# Patient Record
Sex: Male | Born: 1975
Health system: Southern US, Community
[De-identification: ages and names within clinical notes are randomized; demographics above are authoritative.]

## PROBLEM LIST (undated history)

## (undated) DIAGNOSIS — T148XXD Other injury of unspecified body region, subsequent encounter: Secondary | ICD-10-CM

## (undated) DIAGNOSIS — I10 Essential (primary) hypertension: Secondary | ICD-10-CM

## (undated) DIAGNOSIS — F1990 Other psychoactive substance use, unspecified, uncomplicated: Secondary | ICD-10-CM

## (undated) DIAGNOSIS — E119 Type 2 diabetes mellitus without complications: Secondary | ICD-10-CM

## (undated) HISTORY — DX: Essential (primary) hypertension: I10

## (undated) HISTORY — DX: Other psychoactive substance use, unspecified, uncomplicated: F19.90

## (undated) HISTORY — DX: Other injury of unspecified body region, subsequent encounter: T14.8XXD

---

## 2006-06-27 ENCOUNTER — Emergency Department: Payer: Self-pay | Admitting: Emergency Medicine

## 2008-10-25 ENCOUNTER — Emergency Department (HOSPITAL_COMMUNITY): Admission: EM | Admit: 2008-10-25 | Discharge: 2008-10-25 | Payer: Self-pay | Admitting: Emergency Medicine

## 2009-02-09 ENCOUNTER — Emergency Department: Payer: Self-pay | Admitting: Emergency Medicine

## 2010-08-22 ENCOUNTER — Emergency Department: Payer: Self-pay | Admitting: Emergency Medicine

## 2010-10-09 LAB — BASIC METABOLIC PANEL
BUN: 10 mg/dL (ref 6–23)
Chloride: 103 mEq/L (ref 96–112)
Creatinine, Ser: 1.03 mg/dL (ref 0.4–1.5)
GFR calc non Af Amer: >60 mL/min (ref >60)
Glucose, Bld: 149 mg/dL — ABNORMAL HIGH (ref 70–99)

## 2010-10-09 LAB — RAPID URINE DRUG SCREEN, HOSP PERFORMED
Amphetamines: NOT DETECTED
Benzodiazepines: NOT DETECTED
Cocaine: POSITIVE — AB

## 2010-10-09 LAB — DIFFERENTIAL
Basophils Absolute: 0 10*3/uL (ref 0.0–0.1)
Eosinophils Absolute: 0.1 10*3/uL (ref 0.0–0.7)
Eosinophils Relative: 1 % (ref 0–5)
Lymphs Abs: 1.6 10*3/uL (ref 0.7–4.0)
Neutrophils Relative %: 73 % (ref 43–77)

## 2010-10-09 LAB — CBC
MCV: 92.8 fL (ref 78.0–100.0)
Platelets: 171 10*3/uL (ref 150–400)
RDW: 14.1 % (ref 11.5–15.5)
WBC: 9.6 10*3/uL (ref 4.0–10.5)

## 2010-10-09 LAB — TRICYCLICS SCREEN, URINE: TCA Scrn: NOT DETECTED

## 2010-10-09 LAB — ETHANOL: Alcohol, Ethyl (B): <5 mg/dL (ref 0–10)

## 2016-02-19 ENCOUNTER — Ambulatory Visit (HOSPITAL_COMMUNITY)
Admission: EM | Admit: 2016-02-19 | Discharge: 2016-02-19 | Disposition: A | Discharge status: Other Acute Inpatient Hospital | Payer: Self-pay

## 2016-02-19 ENCOUNTER — Encounter (HOSPITAL_COMMUNITY): Payer: Self-pay | Admitting: *Deleted

## 2016-02-19 ENCOUNTER — Encounter (HOSPITAL_COMMUNITY): Payer: Self-pay | Admitting: Emergency Medicine

## 2016-02-19 ENCOUNTER — Emergency Department (HOSPITAL_COMMUNITY)
Admission: EM | Admit: 2016-02-19 | Discharge: 2016-02-19 | Disposition: A | Discharge status: Home / Self Care | Payer: Self-pay | Attending: Emergency Medicine | Admitting: Emergency Medicine

## 2016-02-19 DIAGNOSIS — R739 Hyperglycemia, unspecified: Secondary | ICD-10-CM

## 2016-02-19 DIAGNOSIS — L0291 Cutaneous abscess, unspecified: Secondary | ICD-10-CM

## 2016-02-19 DIAGNOSIS — L02413 Cutaneous abscess of right upper limb: Secondary | ICD-10-CM | POA: Insufficient documentation

## 2016-02-19 DIAGNOSIS — R634 Abnormal weight loss: Secondary | ICD-10-CM

## 2016-02-19 DIAGNOSIS — F17200 Nicotine dependence, unspecified, uncomplicated: Secondary | ICD-10-CM | POA: Insufficient documentation

## 2016-02-19 DIAGNOSIS — E1065 Type 1 diabetes mellitus with hyperglycemia: Secondary | ICD-10-CM | POA: Insufficient documentation

## 2016-02-19 HISTORY — DX: Type 2 diabetes mellitus without complications: E11.9

## 2016-02-19 LAB — I-STAT CHEM 8, ED
BUN: 11 mg/dL (ref 6–20)
CALCIUM ION: 1.16 mmol/L (ref 1.13–1.30)
CREATININE: 0.3 mg/dL — AB (ref 0.61–1.24)
Chloride: 93 mmol/L — ABNORMAL LOW (ref 101–111)
GLUCOSE: 239 mg/dL — AB (ref 65–99)
HCT: 44 % (ref 39.0–52.0)
Hemoglobin: 15 g/dL (ref 13.0–17.0)
Potassium: 3.9 mmol/L (ref 3.5–5.1)
Sodium: 135 mmol/L (ref 135–145)
TCO2: 27 mmol/L (ref 0–100)

## 2016-02-19 LAB — BASIC METABOLIC PANEL
ANION GAP: 16 — AB (ref 5–15)
BUN: 11 mg/dL (ref 6–20)
CALCIUM: 9.5 mg/dL (ref 8.9–10.3)
CHLORIDE: 83 mmol/L — AB (ref 101–111)
CO2: 25 mmol/L (ref 22–32)
Creatinine, Ser: 0.99 mg/dL (ref 0.61–1.24)
GFR calc non Af Amer: >60 mL/min (ref >60)
GLUCOSE: 607 mg/dL — AB (ref 65–99)
POTASSIUM: 4.3 mmol/L (ref 3.5–5.1)
Sodium: 124 mmol/L — ABNORMAL LOW (ref 135–145)

## 2016-02-19 LAB — I-STAT VENOUS BLOOD GAS, ED
ACID-BASE EXCESS: 6 mmol/L — AB (ref 0.0–2.0)
BICARBONATE: 28.5 meq/L — AB (ref 20.0–24.0)
O2 SAT: 100 %
PO2 VEN: 162 mmHg — AB (ref 31.0–45.0)
TCO2: 30 mmol/L (ref 0–100)
pCO2, Ven: 35.5 mmHg — ABNORMAL LOW (ref 45.0–50.0)
pH, Ven: 7.513 — ABNORMAL HIGH (ref 7.250–7.300)

## 2016-02-19 LAB — URINE MICROSCOPIC-ADD ON
RBC / HPF: NONE SEEN RBC/hpf (ref 0–5)
WBC, UA: NONE SEEN WBC/hpf (ref 0–5)

## 2016-02-19 LAB — CBG MONITORING, ED

## 2016-02-19 LAB — CBC
HEMATOCRIT: 44.9 % (ref 39.0–52.0)
HEMOGLOBIN: 15.3 g/dL (ref 13.0–17.0)
MCH: 32.3 pg (ref 26.0–34.0)
MCHC: 34.1 g/dL (ref 30.0–36.0)
MCV: 94.9 fL (ref 78.0–100.0)
Platelets: 174 10*3/uL (ref 150–400)
RBC: 4.73 MIL/uL (ref 4.22–5.81)
RDW: 13.4 % (ref 11.5–15.5)
WBC: 9.2 10*3/uL (ref 4.0–10.5)

## 2016-02-19 LAB — URINALYSIS, ROUTINE W REFLEX MICROSCOPIC
Bilirubin Urine: NEGATIVE
Glucose, UA: >1000 mg/dL — AB
HGB URINE DIPSTICK: NEGATIVE
Ketones, ur: >80 mg/dL — AB
Leukocytes, UA: NEGATIVE
NITRITE: NEGATIVE
PH: 5.5 (ref 5.0–8.0)
Protein, ur: NEGATIVE mg/dL
SPECIFIC GRAVITY, URINE: 1.04 — AB (ref 1.005–1.030)

## 2016-02-19 LAB — I-STAT CG4 LACTIC ACID, ED
LACTIC ACID, VENOUS: 1.83 mmol/L (ref 0.5–1.9)
LACTIC ACID, VENOUS: 1.2 mmol/L (ref 0.5–1.9)

## 2016-02-19 MED ORDER — "SYRINGE 25G X 1-1/2"" 3 ML MISC": 1.0000 | Freq: Two times a day (BID) | 0 refills | Status: DC

## 2016-02-19 MED ORDER — LIDOCAINE HCL (PF) 1 % IJ SOLN
30.0000 mL | Freq: Once | INTRAMUSCULAR | Status: AC
  Administered 2016-02-19: 30 mL
  Filled 2016-02-19: qty 30

## 2016-02-19 MED ORDER — INSULIN ASPART 100 UNIT/ML ~~LOC~~ SOLN
10.0000 [IU] | Freq: Once | SUBCUTANEOUS | Status: AC
  Administered 2016-02-19: 10 [IU] via SUBCUTANEOUS
  Filled 2016-02-19: qty 1

## 2016-02-19 MED ORDER — INSULIN ASPART PROT & ASPART (70-30 MIX) 100 UNIT/ML ~~LOC~~ SUSP: 20.0000 [IU] | Freq: Two times a day (BID) | SUBCUTANEOUS | 1 refills | Status: DC

## 2016-02-19 MED ORDER — SODIUM CHLORIDE 0.9 % IV BOLUS (SEPSIS)
2000.0000 mL | Freq: Once | INTRAVENOUS | Status: AC
Start: 2016-02-19 — End: 2016-02-19
  Administered 2016-02-19: 2000 mL via INTRAVENOUS

## 2016-02-19 MED ORDER — SULFAMETHOXAZOLE-TRIMETHOPRIM 800-160 MG PO TABS
1.0000 | ORAL_TABLET | Freq: Once | ORAL | Status: AC
  Administered 2016-02-19: 1 via ORAL
  Filled 2016-02-19: qty 1

## 2016-02-19 MED ORDER — SULFAMETHOXAZOLE-TRIMETHOPRIM 800-160 MG PO TABS: 1.0000 | ORAL_TABLET | Freq: Two times a day (BID) | ORAL | 0 refills | Status: AC

## 2016-02-19 NOTE — ED Notes (Signed)
CBG: >600 ; HI 

## 2016-02-19 NOTE — ED Provider Notes (Addendum)
[facility]    CSN: [id number] Arrival date & time: 02/19/16  1031  First Provider Contact:  First MD Initiated Contact with Patient 02/19/16 1129        History   Chief Complaint Chief Complaint  Patient presents with  . Blood Sugar Problem    HPI Tony Long is a 40 y.o. male.    Arm Injury  Location:  Elbow Elbow location:  R elbow Injury: no (pt out of incarceration, reports self injecting and recent swelling and pain at antecubital site, on right.)   Pain details:    Severity:  Moderate   Onset quality:  Sudden   Duration:  1 week   Progression:  Worsening Dislocation: no   Foreign body present:  No foreign bodies Tetanus status:  Up to date Prior injury to area:  No Relieved by:  None tried Worsened by:  Nothing Ineffective treatments:  None tried Associated symptoms: fatigue, fever and swelling   Associated symptoms comment:  Reports being diabetic with wt loss and no insulin for 1 month.   No past medical history on file.  There are no active problems to display for this patient.   No past surgical history on file.     Home Medications    Prior to Admission medications   Not on File    Family History No family history on file.  Social History Social History  Substance Use Topics  . Smoking status: Not on file  . Smokeless tobacco: Not on file  . Alcohol use Not on file     Allergies   Review of patient's allergies indicates not on file.   Review of Systems Review of Systems  Constitutional: Positive for fatigue, fever and unexpected weight change.  Neurological: Positive for weakness.     Physical Exam Triage Vital Signs ED Triage Vitals [02/19/16 1121]  Enc Vitals Group     BP 136/93     Pulse Rate 92     Resp 16     Temp 98.4 F (36.9 C)     Temp Source Oral     SpO2 99 %     Weight 150 lb (68 kg)     Height 5\' 11"  (1.803 m)     Head Circumference      Peak Flow      Pain Score      Pain Loc     Pain Edu?      Excl. in GC?    No data found.   Updated Vital Signs BP 136/93 (BP Location: Left Arm)   Pulse 92   Temp 98.4 F (36.9 C) (Oral)   Resp 16   Ht 5\' 11"  (1.803 m)   Wt 150 lb (68 kg)   SpO2 99%   BMI 20.92 kg/m   Visual Acuity Right Eye Distance:   Left Eye Distance:   Bilateral Distance:    Right Eye Near:   Left Eye Near:    Bilateral Near:     Physical Exam  Constitutional: He appears well-developed and well-nourished.  Neurological: He is alert.  Skin: Skin is warm and dry. There is erythema.     Nursing note and vitals reviewed.    UC Treatments / Results  Labs (all labs ordered are listed, but only abnormal results are displayed) Labs Reviewed - No data to display  EKG  EKG Interpretation None       Radiology No results found.  Procedures Procedures (including critical care time)  Medications Ordered in UC Medications - No data to display   Initial Impression / Assessment and Plan / UC Course  I have reviewed the triage vital signs and the nursing notes.  Pertinent labs & imaging results that were available during my care of the patient were reviewed by me and considered in my medical decision making (see chart for details).  Clinical Course  sent for mngment of apparent diabetes and antecubital space infection from self injection.    Final Clinical Impressions(s) / UC Diagnoses   Final diagnoses:  None    New Prescriptions New Prescriptions   No medications on file     Linna HoffJames D Terrian Sentell, MD 02/19/16 1146    Linna HoffJames D Natlie Asfour, MD 02/19/16 1147

## 2016-02-19 NOTE — ED Triage Notes (Signed)
Pt sts hyperglycemia and abscess to right AC area x 1 week; pt sts has not had any insulin x 2 months since getting out of prison; pt with redness to right AC and swelling

## 2016-02-19 NOTE — ED Notes (Signed)
Report  phoned  To nurse  First  Shanda BumpsJessica

## 2016-02-19 NOTE — ED Provider Notes (Signed)
[facility]   CSN: [id number] Arrival date & time: 02/19/16  1210     History   Chief Complaint Chief Complaint  Patient presents with  . Hyperglycemia  . Abscess    HPI Tony Long is a 40 y.o. male.  The history is provided by the patient and medical records. No language interpreter was used.  Hyperglycemia  Blood sugar level PTA:  600 Severity:  Moderate Onset quality:  Gradual Duration: unk, last time patient had insulin was about a month ago. Timing:  Constant Progression:  Worsening Chronicity:  Chronic Diabetes status:  Controlled with insulin Current diabetic therapy:  Novolog 70/30 20U BID Time since last antidiabetic medication:  1 month Context: noncompliance and recent illness   Context: not change in medication, not insulin pump use, not new diabetes diagnosis and not recent change in diet   Relieved by:  Nothing Ineffective treatments:  None tried Associated symptoms: increased thirst and polyuria   Associated symptoms: no abdominal pain, no altered mental status, no blurred vision, no chest pain, no confusion, no dehydration, no diaphoresis, no dysuria, no fever, no malaise, no nausea, no shortness of breath, no syncope and no vomiting   Risk factors: no hx of DKA, no obesity and no recent steroid use   Abscess  Associated symptoms: no fever, no nausea and no vomiting     Past Medical History:  Diagnosis Date  . Diabetes mellitus without complication (HCC)     There are no active problems to display for this patient.   History reviewed. No pertinent surgical history.     Home Medications    Prior to Admission medications   Not on File    Family History History reviewed. No pertinent family history.  Social History Social History  Substance Use Topics  . Smoking status: Current Every Day Smoker  . Smokeless tobacco: Never Used  . Alcohol use No     Allergies   Review of patient's allergies indicates no  known allergies.   Review of Systems Review of Systems  Constitutional: Negative for diaphoresis and fever.  HENT: Negative.   Eyes: Negative.  Negative for blurred vision.  Respiratory: Negative for cough and shortness of breath.   Cardiovascular: Negative for chest pain, palpitations and syncope.  Gastrointestinal: Negative for abdominal pain, diarrhea, nausea and vomiting.  Endocrine: Positive for polydipsia and polyuria.  Genitourinary: Negative for dysuria and hematuria.  Musculoskeletal: Negative.        Asides from R arm pain where abscess is  Skin: Negative for rash and wound.  Allergic/Immunologic: Negative for immunocompromised state.  Neurological: Negative.   Psychiatric/Behavioral: Negative for agitation and confusion. The patient is not nervous/anxious.      Physical Exam Updated Vital Signs BP 133/95 (BP Location: Left Arm)   Pulse 99   Temp 98.2 F (36.8 C) (Oral)   Resp 19   SpO2 95%   Physical Exam  Constitutional: He is oriented to person, place, and time. He appears well-developed and well-nourished. No distress.  HENT:  Head: Normocephalic and atraumatic.  Eyes: Conjunctivae are normal.  Neck: Normal range of motion. Neck supple. No tracheal deviation present.  Cardiovascular: Normal rate, regular rhythm, normal heart sounds and intact distal pulses.   No murmur heard. Borderline tachycardic  Pulmonary/Chest: Effort normal and breath sounds normal. No stridor. No respiratory distress. He has no wheezes. He has no rales.  Abdominal: Soft. He exhibits no distension. There is no tenderness. There is no rebound and  no guarding.  Musculoskeletal: He exhibits tenderness (to R anterior elbow and wiith ROM). He exhibits no edema or deformity.  Neurological: He is alert and oriented to person, place, and time. He is not disoriented. GCS eye subscore is 4. GCS verbal subscore is 5. GCS motor subscore is 6.  Skin: Skin is warm and dry. Capillary refill takes less  than 2 seconds. He is not diaphoretic.     Nursing note and vitals reviewed.    ED Treatments / Results  Labs (all labs ordered are listed, but only abnormal results are displayed) Labs Reviewed  BASIC METABOLIC PANEL - Abnormal; Notable for the following:       Result Value   Sodium 124 (*)    Chloride 83 (*)    Glucose, Bld 607 (*)    Anion gap 16 (*)    All other components within normal limits  URINALYSIS, ROUTINE W REFLEX MICROSCOPIC (NOT AT [facility]) - Abnormal; Notable for the following:    Specific Gravity, Urine 1.040 (*)    Glucose, UA >1000 (*)    Ketones, ur >80 (*)    All other components within normal limits  URINE MICROSCOPIC-ADD ON - Abnormal; Notable for the following:    Squamous Epithelial / LPF 0-5 (*)    Bacteria, UA RARE (*)    All other components within normal limits  CBG MONITORING, ED - Abnormal; Notable for the following:    Glucose-Capillary >600 (*)    All other components within normal limits  I-STAT VENOUS BLOOD GAS, ED - Abnormal; Notable for the following:    pH, Ven 7.513 (*)    pCO2, Ven 35.5 (*)    pO2, Ven 162.0 (*)    Bicarbonate 28.5 (*)    Acid-Base Excess 6.0 (*)    All other components within normal limits  I-STAT CHEM 8, ED - Abnormal; Notable for the following:    Chloride 93 (*)    Creatinine, Ser 0.30 (*)    Glucose, Bld 239 (*)    All other components within normal limits  CBC  I-STAT CG4 LACTIC ACID, ED  I-STAT CG4 LACTIC ACID, ED    EKG  EKG Interpretation None       Radiology No results found.  Procedures .Marland Kitchen.Incision and Drainage Date/Time: 02/22/2016 5:33 AM Performed by: Maretta BeesFORNAGE, Demarqus Jocson Authorized by: Charlynne PanderYAO, DAVID HSIENTA   Consent:    Consent obtained:  Verbal   Consent given by:  Patient   Risks discussed:  Bleeding, incomplete drainage, pain and infection   Alternatives discussed:  No treatment Universal protocol:    Procedure explained and questions answered to patient or proxy's satisfaction: yes      Test results available and properly labeled: yes     Imaging studies available: yes     Patient identity confirmed:  Arm band Location:    Type:  Abscess   Size:  4 cm   Location:  Upper extremity   Upper extremity location:  Arm   Arm location:  R lower arm Pre-procedure details:    Skin preparation:  Betadine Sedation:    Sedation type: none. Anesthesia (see MAR for exact dosages):    Anesthesia method:  Local infiltration   Local anesthetic:  Lidocaine 1% w/o epi Procedure type:    Complexity:  Simple Procedure details:    Needle aspiration: no     Incision types:  Single straight   Incision depth:  Dermal   Scalpel blade:  11   Wound management:  Probed and deloculated   Drainage:  Bloody and purulent   Drainage amount:  Moderate   Wound treatment:  Wound left open   Packing materials:  None Post-procedure details:    Patient tolerance of procedure:  Tolerated well, no immediate complications   (including critical care time)  EMERGENCY DEPARTMENT US SOFT TISSUE INTERPRETATION "Study: Limited Ultrasound of the noted body part in comments below"  INDICATIONS: Soft tissue infection Multiple views of the body part are obtained with a multi-frequency linear probe  PERFORMED BY:  Myself  IMAGES ARCHIVED?: Yes  SIDE:Right   BODY PART:Upper extremity  FINDINGS: Abcess present  LIMITATIONS:  none  INTERPRETATION:  Abcess present  COMMENT:  N/A    Medications Ordered in ED Medications  sodium chloride 0.9 % bolus 2,000 mL (0 mLs Intravenous Stopped 02/19/16 1649)  lidocaine (PF) (XYLOCAINE) 1 % injection 30 mL (30 mLs Infiltration Given 02/19/16 1649)  insulin aspart (novoLOG) injection 10 Units (10 Units Subcutaneous Given 02/19/16 1649)  sulfamethoxazole-trimethoprim (BACTRIM DS,SEPTRA DS) 800-160 MG per tablet 1 tablet (1 tablet Oral Given 02/19/16 1649)     Initial Impression / Assessment and Plan / ED Course  I have reviewed the triage vital signs and the  nursing notes.  Pertinent labs & imaging results that were available during my care of the patient were reviewed by me and considered in my medical decision making (see chart for details).  Clinical Course   Pt with history of IDDM presents with BGL of 600. Ran out of novolog 70/30 about a month ago. Has a hx of IVDU but states he had recently been on suboxone, but is also trying to cut back. Also presents with R AC abscess. No recent drainage. No constitutional symptoms either. Pt only report increased thirst and polyuria. Basic bloodwork obtained. No leukocytosis and bicarb mildly decreased, so VBG obtained and was WNL, so doubt DKA. LA WNL. Thus patient was given 2L of fluid and 1 dose of novolog 10 U to decreased glucose. Abscess was succesfully drained afterwards. On BMP recheck, BGL was 239. Pt was rx'ed 1 week of bactrim as well for cellulitis PPX. Basic wound care instructions given. Patient was re-ordered enough insulin to make it to his PCP appointment. Was also given injection supplies. Patient has working glucometer at home. Discussed results with pt and plan. States agreement and understanding. Usual and customary return precautions given for drained abscess and hyperglycemia. Tolerating po. PT and VS stable at dc.  Final Clinical Impressions(s) / ED Diagnoses   Final diagnoses:  Abscess  Hyperglycemia    New Prescriptions Discharge Medication List as of 02/19/2016  5:54 PM    START taking these medications   Details  insulin aspart protamine- aspart (NOVOLOG MIX 70/30) (70-30) 100 UNIT/ML injection Inject 0.2 mLs (20 Units total) into the skin 2 (two) times daily with a meal., Starting Tue 02/19/2016, Print    sulfamethoxazole-trimethoprim (BACTRIM DS,SEPTRA DS) 800-160 MG tablet Take 1 tablet by mouth 2 (two) times daily., Starting Tue 02/19/2016, Until Tue 02/26/2016, Print    Syringe/Needle, Disp, (SYRINGE 3CC/25GX1-1/2") 25G X 1-1/2" 3 ML MISC 1 Syringe by Does not apply route 2  (two) times daily., Starting Tue 02/19/2016, Until Wed 04/09/2016, Print         Maretta Bees, MD 02/22/16 1478    Charlynne Pander, MD 02/23/16 [phone]

## 2016-02-19 NOTE — ED Triage Notes (Signed)
Pt  States    He  Has   Had  Weight  Loss   Is  A  Diabetic and   Has  Not taken  Any   meds  In  Over  1  Month    he  Also  Has  Redness  And  Swelling r  anticubidal  Area    Where he  States  He  Put a  Needle  In it  To  Relieve  Pressure   He  States  He  Has  Been incarcerated      And  Has  No pcp

## 2016-02-20 MED FILL — !NOVOLOG MIX 70/30 VIAL: 70-30/ML | 25 days supply | Qty: 10 | Fill #0

## 2016-02-20 MED FILL — SULFAMETHOXAZOLE-TMP DS TAB: 800-160 | 7 days supply | Qty: 14 | Fill #0

## 2016-02-20 MED FILL — TRUEPLUS SYR 1ML 30GX5/16: 30G X 5/16" | 50 days supply | Qty: 100 | Fill #0

## 2016-03-13 ENCOUNTER — Encounter: Payer: Self-pay | Admitting: Family Medicine

## 2016-03-13 ENCOUNTER — Ambulatory Visit (INDEPENDENT_AMBULATORY_CARE_PROVIDER_SITE_OTHER): Payer: Self-pay | Admitting: Family Medicine

## 2016-03-13 VITALS — BP 136/87 | HR 87 | Temp 97.9°F | Resp 14 | Ht 71.0 in | Wt 167.0 lb

## 2016-03-13 DIAGNOSIS — Z1159 Encounter for screening for other viral diseases: Secondary | ICD-10-CM

## 2016-03-13 DIAGNOSIS — Z794 Long term (current) use of insulin: Secondary | ICD-10-CM

## 2016-03-13 DIAGNOSIS — E1140 Type 2 diabetes mellitus with diabetic neuropathy, unspecified: Secondary | ICD-10-CM

## 2016-03-13 DIAGNOSIS — Z9114 Patient's other noncompliance with medication regimen: Secondary | ICD-10-CM

## 2016-03-13 LAB — GLUCOSE, CAPILLARY: Glucose-Capillary: >600 mg/dL (ref 65–99)

## 2016-03-13 LAB — POCT URINALYSIS DIP (DEVICE)
Bilirubin Urine: NEGATIVE
Glucose, UA: 500 mg/dL — AB
HGB URINE DIPSTICK: NEGATIVE
Ketones, ur: NEGATIVE mg/dL
Leukocytes, UA: NEGATIVE
NITRITE: NEGATIVE
PH: 6 (ref 5.0–8.0)
PROTEIN: NEGATIVE mg/dL
Specific Gravity, Urine: <=1.005 (ref 1.005–1.030)
UROBILINOGEN UA: 0.2 mg/dL (ref 0.0–1.0)

## 2016-03-13 LAB — POCT GLYCOSYLATED HEMOGLOBIN (HGB A1C): Hemoglobin A1C: 12

## 2016-03-13 LAB — COMPLETE METABOLIC PANEL WITH GFR
ALBUMIN: 4.2 g/dL (ref 3.6–5.1)
ALK PHOS: 103 U/L (ref 40–115)
ALT: 13 U/L (ref 9–46)
AST: 12 U/L (ref 10–40)
BILIRUBIN TOTAL: 0.5 mg/dL (ref 0.2–1.2)
BUN: 16 mg/dL (ref 7–25)
CALCIUM: 9.9 mg/dL (ref 8.6–10.3)
CO2: 25 mmol/L (ref 20–31)
Chloride: 91 mmol/L — ABNORMAL LOW (ref 98–110)
Creat: 0.84 mg/dL (ref 0.60–1.35)
Glucose, Bld: 578 mg/dL (ref 65–99)
POTASSIUM: 5.1 mmol/L (ref 3.5–5.3)
Sodium: 127 mmol/L — ABNORMAL LOW (ref 135–146)
TOTAL PROTEIN: 8.1 g/dL (ref 6.1–8.1)

## 2016-03-13 LAB — CBC WITH DIFFERENTIAL/PLATELET
BASOS ABS: 62 {cells}/uL (ref 0–200)
BASOS PCT: 1 %
EOS PCT: 4 %
Eosinophils Absolute: 248 cells/uL (ref 15–500)
HCT: 46.7 % (ref 38.5–50.0)
Hemoglobin: 14.9 g/dL (ref 13.2–17.1)
Lymphocytes Relative: 38 %
Lymphs Abs: 2356 cells/uL (ref 850–3900)
MCH: 31.9 pg (ref 27.0–33.0)
MCHC: 31.9 g/dL — ABNORMAL LOW (ref 32.0–36.0)
MCV: 100 fL (ref 80.0–100.0)
MONOS PCT: 7 %
MPV: 11.6 fL (ref 7.5–12.5)
Monocytes Absolute: 434 cells/uL (ref 200–950)
NEUTROS ABS: 3100 {cells}/uL (ref 1500–7800)
Neutrophils Relative %: 50 %
PLATELETS: 197 10*3/uL (ref 140–400)
RBC: 4.67 MIL/uL (ref 4.20–5.80)
RDW: 14.4 % (ref 11.0–15.0)
WBC: 6.2 10*3/uL (ref 3.8–10.8)

## 2016-03-13 LAB — LIPID PANEL
CHOL/HDL RATIO: 3.7 ratio (ref <=5.0)
CHOLESTEROL: 198 mg/dL (ref 125–200)
HDL: 53 mg/dL (ref >=40)
LDL Cholesterol: 90 mg/dL (ref <130)
TRIGLYCERIDES: 273 mg/dL — AB (ref <150)
VLDL: 55 mg/dL — ABNORMAL HIGH (ref <30)

## 2016-03-13 MED ORDER — LISINOPRIL 5 MG PO TABS: 5.0000 mg | ORAL_TABLET | Freq: Every day | ORAL | 3 refills | Status: DC

## 2016-03-13 MED ORDER — INSULIN REGULAR HUMAN 100 UNIT/ML IJ SOLN
20.0000 [IU] | Freq: Once | INTRAMUSCULAR | Status: AC
  Administered 2016-03-13: 20 [IU] via SUBCUTANEOUS

## 2016-03-13 MED ORDER — INSULIN NPH (HUMAN) (ISOPHANE) 100 UNIT/ML ~~LOC~~ SUSP: 20.0000 [IU] | Freq: Once | SUBCUTANEOUS | Status: DC

## 2016-03-13 MED ORDER — PRAVASTATIN SODIUM 40 MG PO TABS: 40.0000 mg | ORAL_TABLET | Freq: Every day | ORAL | 3 refills | Status: DC

## 2016-03-13 MED ORDER — "SYRINGE 25G X 1-1/2"" 3 ML MISC": 1.0000 | Freq: Two times a day (BID) | 0 refills | Status: DC

## 2016-03-13 MED ORDER — INSULIN ASPART PROT & ASPART (70-30 MIX) 100 UNIT/ML ~~LOC~~ SUSP: 30.0000 [IU] | Freq: Two times a day (BID) | SUBCUTANEOUS | 11 refills | Status: DC

## 2016-03-13 MED ORDER — INSULIN REGULAR NICU BOLUS VIA INFUSION: 20.0000 [IU] | Freq: Once | INTRAVENOUS | Status: DC

## 2016-03-13 MED ORDER — GLUCOSE BLOOD VI STRP: ORAL_STRIP | 12 refills | Status: AC

## 2016-03-13 MED ORDER — ONETOUCH ULTRASOFT LANCETS MISC: 12 refills | Status: AC

## 2016-03-13 MED FILL — PRAVASTATIN NA 40 MG TAB: 40 | 30 days supply | Qty: 30 | Fill #0

## 2016-03-13 MED FILL — TRUEplus LANCETS 28G MISC: 25 days supply | Qty: 100 | Fill #0

## 2016-03-13 MED FILL — LISINOPRIL 5 MG TABLET: 5 | 30 days supply | Qty: 30 | Fill #0

## 2016-03-13 MED FILL — TRUE METRIX GLUCOSE TEST ST: 30 days supply | Qty: 100 | Fill #0

## 2016-03-14 ENCOUNTER — Other Ambulatory Visit: Payer: Self-pay | Admitting: Family Medicine

## 2016-03-14 ENCOUNTER — Telehealth: Payer: Self-pay | Admitting: *Deleted

## 2016-03-14 DIAGNOSIS — Z9114 Patient's other noncompliance with medication regimen: Secondary | ICD-10-CM | POA: Insufficient documentation

## 2016-03-14 DIAGNOSIS — E1140 Type 2 diabetes mellitus with diabetic neuropathy, unspecified: Secondary | ICD-10-CM

## 2016-03-14 DIAGNOSIS — Z91148 Patient's other noncompliance with medication regimen for other reason: Secondary | ICD-10-CM | POA: Insufficient documentation

## 2016-03-14 DIAGNOSIS — Z794 Long term (current) use of insulin: Principal | ICD-10-CM

## 2016-03-14 DIAGNOSIS — E119 Type 2 diabetes mellitus without complications: Secondary | ICD-10-CM | POA: Insufficient documentation

## 2016-03-14 LAB — HEPATITIS C ANTIBODY: HCV AB: REACTIVE — AB

## 2016-03-14 LAB — HIV ANTIBODY (ROUTINE TESTING W REFLEX): HIV 1&2 Ab, 4th Generation: NONREACTIVE

## 2016-03-14 MED ORDER — INSULIN ASPART PROT & ASPART (70-30 MIX) 100 UNIT/ML ~~LOC~~ SUSP: 30.0000 [IU] | Freq: Two times a day (BID) | SUBCUTANEOUS | 11 refills | Status: DC

## 2016-03-14 MED ORDER — "SYRINGE 25G X 1-1/2"" 3 ML MISC": 1.0000 | Freq: Two times a day (BID) | 0 refills | Status: AC

## 2016-03-14 MED FILL — !NOVOLOG MIX 70/30 VIAL: 70-30/ML | 25 days supply | Qty: 10 | Fill #1

## 2016-03-14 NOTE — Patient Instructions (Signed)
Return in 2 weeks with meter to review. Please take you medications as prescribed.

## 2016-03-14 NOTE — Telephone Encounter (Signed)
Patients Glucose was 578 and repeated for confirmation on blood work

## 2016-03-14 NOTE — Progress Notes (Signed)
Tony [name], is a 40 y.o. male  UJW:[id number]:[id number]  FAO:[id number]  DOB - 08/19/1975  CC:  Chief Complaint  Patient presents with  . Establish Care  . Diabetes  . Foot Swelling    ankle swelling        HPI: Tony [name] is a 40 y.o. male here to establish care. Patient has a history of uncontrolled diabetes due to non-compliance to treatment regimine. He was recently in ED with BS over 600. He was referred her for primary care. He has not had insulin in 2-3 days. He has been prescribed Novolog 70/30 and is on 30 units daily. He reports that this controls his sugars when he takes it. His blood sugar today is 578. Urine is negitive for ketones. He reports feeling fine. He does report urinary frequency and some swelling of feet and ankles. He needs refills of Novolog 70/30, needles, lancets and strips. He has not been prescribed an ACE or statin.  No Known Allergies Past Medical History:  Diagnosis Date  . Diabetes mellitus without complication (HCC)    No current outpatient prescriptions on file prior to visit.   No current facility-administered medications on file prior to visit.    Family History  Problem Relation Age of Onset  . Heart disease Father   . Diabetes Father   . Diabetes Paternal Uncle    Social History   Social History  . Marital status: Single    Spouse name: N/A  . Number of children: N/A  . Years of education: N/A   Occupational History  . Not on file.   Social History Main Topics  . Smoking status: Current Every Day Smoker    Packs/day: 1.00  . Smokeless tobacco: Never Used  . Alcohol use Yes     Comment: occ  . Drug use: No     Comment: pt mother said hx of use  . Sexual activity: Not on file   Other Topics Concern  . Not on file   Social History Narrative  . No narrative on file    Review of Systems: Constitutional: Negative Skin: Negative HENT: Negative  Eyes: Negative  Neck: Negative Respiratory: Negative Cardiovascular:  Positive for swelling of feet and ankles Gastrointestinal: Negative Genitourinary: Positive for frequency Musculoskeletal: Negative   Neurological: Negative for Hematological: Negative  Psychiatric/Behavioral: Negative    Objective:   Vitals:   03/13/16 1406  BP: 136/87  Pulse: 87  Resp: 14  Temp: 97.9 F (36.6 C)    Physical Exam: Constitutional: Patient appears well-developed and well-nourished. No distress. HENT: Normocephalic, atraumatic, External right and left ear normal. Oropharynx is clear and moist.  Eyes: Conjunctivae and EOM are normal. PERRLA, no scleral icterus. Neck: Normal ROM. Neck supple. No lymphadenopathy, No thyromegaly. CVS: RRR, S1/S2 +, no murmurs, no gallops, no rubs Pulmonary: Effort and breath sounds normal, no stridor, rhonchi, wheezes, rales.  Abdominal: Soft. Normoactive BS,, no distension, tenderness, rebound or guarding.  Musculoskeletal: Normal range of motion. No edema and no tenderness.  Neuro: Alert.Normal muscle tone coordination. Non-focal Skin: Skin is warm and dry. No rash noted. Not diaphoretic. No erythema. No pallor. Psychiatric: Normal mood and affect. Behavior, judgment, thought content normal.  Lab Results  Component Value Date   WBC 6.2 03/13/2016   HGB 14.9 03/13/2016   HCT 46.7 03/13/2016   MCV 100.0 03/13/2016   PLT 197 03/13/2016   Lab Results  Component Value Date   CREATININE 0.84 03/13/2016   BUN 16 03/13/2016  NA 127 (L) 03/13/2016   K 5.1 03/13/2016   CL 91 (L) 03/13/2016   CO2 25 03/13/2016    Lab Results  Component Value Date   HGBA1C 12.0 03/13/2016   Lipid Panel     Component Value Date/Time   CHOL 198 03/13/2016 1456   TRIG 273 (H) 03/13/2016 1456   HDL 53 03/13/2016 1456   CHOLHDL 3.7 03/13/2016 1456   VLDL 55 (H) 03/13/2016 1456   LDLCALC 90 03/13/2016 1456       Assessment and plan:   1. Type 2 diabetes mellitus with diabetic neuropathy, with [name]-term current use of insulin (HCC)  -  HgB A1c - insulin aspart protamine- aspart (NOVOLOG MIX 70/30) (70-30) 100 UNIT/ML injection; Inject 0.3 mLs (30 Units total) into the skin 2 (two) times daily with a meal.  Dispense: 1 vial; Refill: 11 - Syringe/Needle, Disp, (SYRINGE 3CC/25GX1-1/2") 25G X 1-1/2" 3 ML MISC; 1 Syringe by Does not apply route 2 (two) times daily.  Dispense: 100 each; Refill: 0 - glucose blood test strip; Use as instructed  Dispense: 100 each; Refill: 12 - Lancets (ONETOUCH ULTRASOFT) lancets; Use as instructed  Dispense: 100 each; Refill: 12 - lisinopril (PRINIVIL,ZESTRIL) 5 MG tablet; Take 1 tablet (5 mg total) by mouth daily.  Dispense: 90 tablet; Refill: 3 - pravastatin (PRAVACHOL) 40 MG tablet; Take 1 tablet (40 mg total) by mouth daily.  Dispense: 90 tablet; Refill: 3 - COMPLETE METABOLIC PANEL WITH GFR - CBC with Differential - Lipid panel - HIV antibody (with reflex) - insulin regular (NOVOLIN R,HUMULIN R) 100 units/mL injection 20 Units; Inject 0.2 mLs (20 Units total) into the skin once.  Return in two weeks with you meter to review.  2. Need for hepatitis C screening test  - Hepatitis C Antibody   No Follow-up on file.  The patient was given clear instructions to go to ER or return to medical center if symptoms don't improve, worsen or new problems develop. The patient verbalized understanding.    Henrietta Hoover FNP  03/14/2016, 12:22 PM

## 2016-03-17 LAB — HEPATITIS C RNA QUANTITATIVE

## 2016-03-18 ENCOUNTER — Other Ambulatory Visit: Payer: Self-pay | Admitting: Family Medicine

## 2016-03-18 DIAGNOSIS — B1920 Unspecified viral hepatitis C without hepatic coma: Secondary | ICD-10-CM

## 2016-03-27 ENCOUNTER — Other Ambulatory Visit: Payer: Self-pay | Admitting: Family Medicine

## 2016-03-27 ENCOUNTER — Ambulatory Visit: Payer: Self-pay

## 2016-03-27 MED ORDER — CEPHALEXIN 500 MG PO CAPS: 500.0000 mg | ORAL_CAPSULE | Freq: Two times a day (BID) | ORAL | 0 refills | Status: DC

## 2016-04-07 ENCOUNTER — Ambulatory Visit: Payer: Self-pay | Admitting: Family Medicine

## 2016-04-11 ENCOUNTER — Telehealth: Payer: Self-pay

## 2016-04-11 DIAGNOSIS — E1140 Type 2 diabetes mellitus with diabetic neuropathy, unspecified: Secondary | ICD-10-CM

## 2016-04-11 DIAGNOSIS — Z794 Long term (current) use of insulin: Principal | ICD-10-CM

## 2016-04-11 MED ORDER — INSULIN ASPART PROT & ASPART (70-30 MIX) 100 UNIT/ML ~~LOC~~ SUSP
30.0000 [IU] | Freq: Two times a day (BID) | SUBCUTANEOUS | 11 refills | Status: DC
Start: 2016-04-11 — End: 2016-05-07

## 2016-04-11 NOTE — Telephone Encounter (Signed)
This has been refilled.  Thanks. 

## 2016-04-23 ENCOUNTER — Ambulatory Visit: Payer: Self-pay | Admitting: Family Medicine

## 2016-05-06 ENCOUNTER — Telehealth: Payer: Self-pay

## 2016-05-06 DIAGNOSIS — E1140 Type 2 diabetes mellitus with diabetic neuropathy, unspecified: Secondary | ICD-10-CM

## 2016-05-06 DIAGNOSIS — Z794 Long term (current) use of insulin: Principal | ICD-10-CM

## 2016-05-07 MED ORDER — INSULIN ASPART PROT & ASPART (70-30 MIX) 100 UNIT/ML ~~LOC~~ SUSP: 30.0000 [IU] | Freq: Two times a day (BID) | SUBCUTANEOUS | 11 refills | Status: DC

## 2016-05-07 MED FILL — !NOVOLOG MIX 70/30 VIAL: 70-30/ML | 16 days supply | Qty: 10 | Fill #0

## 2016-05-07 NOTE — Telephone Encounter (Signed)
This has been refilled and sent into pharmacy. Thanks!  

## 2016-06-02 ENCOUNTER — Encounter: Payer: Self-pay | Admitting: Family Medicine

## 2016-06-02 ENCOUNTER — Other Ambulatory Visit: Payer: Self-pay | Admitting: Family Medicine

## 2016-06-02 ENCOUNTER — Ambulatory Visit (INDEPENDENT_AMBULATORY_CARE_PROVIDER_SITE_OTHER): Payer: Self-pay | Admitting: Family Medicine

## 2016-06-02 VITALS — BP 130/77 | HR 77 | Temp 98.2°F | Resp 16 | Ht 71.0 in | Wt 170.0 lb

## 2016-06-02 DIAGNOSIS — Z1159 Encounter for screening for other viral diseases: Secondary | ICD-10-CM

## 2016-06-02 DIAGNOSIS — N521 Erectile dysfunction due to diseases classified elsewhere: Secondary | ICD-10-CM

## 2016-06-02 DIAGNOSIS — Z794 Long term (current) use of insulin: Secondary | ICD-10-CM

## 2016-06-02 DIAGNOSIS — E118 Type 2 diabetes mellitus with unspecified complications: Secondary | ICD-10-CM

## 2016-06-02 DIAGNOSIS — E1140 Type 2 diabetes mellitus with diabetic neuropathy, unspecified: Secondary | ICD-10-CM

## 2016-06-02 LAB — COMPLETE METABOLIC PANEL WITH GFR
ALBUMIN: 4.5 g/dL (ref 3.6–5.1)
ALT: 75 U/L — AB (ref 9–46)
AST: 46 U/L — AB (ref 10–40)
Alkaline Phosphatase: 107 U/L (ref 40–115)
BUN: 10 mg/dL (ref 7–25)
CALCIUM: 9.7 mg/dL (ref 8.6–10.3)
CHLORIDE: 98 mmol/L (ref 98–110)
CO2: 29 mmol/L (ref 20–31)
CREATININE: 0.84 mg/dL (ref 0.60–1.35)
GFR, Est African American: >89 mL/min (ref >=60)
GFR, Est Non African American: >89 mL/min (ref >=60)
Glucose, Bld: 283 mg/dL — ABNORMAL HIGH (ref 65–99)
Potassium: 3.9 mmol/L (ref 3.5–5.3)
Sodium: 137 mmol/L (ref 135–146)
Total Bilirubin: 0.3 mg/dL (ref 0.2–1.2)
Total Protein: 7.9 g/dL (ref 6.1–8.1)

## 2016-06-02 LAB — LIPID PANEL
CHOLESTEROL: 179 mg/dL (ref <200)
HDL: 51 mg/dL (ref >40)
LDL Cholesterol: 106 mg/dL — ABNORMAL HIGH (ref <100)
Total CHOL/HDL Ratio: 3.5 Ratio (ref <5.0)
Triglycerides: 110 mg/dL (ref <150)
VLDL: 22 mg/dL (ref <30)

## 2016-06-02 LAB — CBC WITH DIFFERENTIAL/PLATELET
BASOS PCT: 1 %
Basophils Absolute: 64 cells/uL (ref 0–200)
EOS ABS: 320 {cells}/uL (ref 15–500)
Eosinophils Relative: 5 %
HEMATOCRIT: 49.1 % (ref 38.5–50.0)
Hemoglobin: 16.2 g/dL (ref 13.2–17.1)
Lymphocytes Relative: 33 %
Lymphs Abs: 2112 cells/uL (ref 850–3900)
MCH: 31.5 pg (ref 27.0–33.0)
MCHC: 33 g/dL (ref 32.0–36.0)
MCV: 95.3 fL (ref 80.0–100.0)
MONO ABS: 576 {cells}/uL (ref 200–950)
MPV: 11.4 fL (ref 7.5–12.5)
Monocytes Relative: 9 %
NEUTROS ABS: 3328 {cells}/uL (ref 1500–7800)
Neutrophils Relative %: 52 %
PLATELETS: 196 10*3/uL (ref 140–400)
RBC: 5.15 MIL/uL (ref 4.20–5.80)
RDW: 12.3 % (ref 11.0–15.0)
WBC: 6.4 10*3/uL (ref 3.8–10.8)

## 2016-06-02 LAB — POCT URINALYSIS DIP (DEVICE)
Bilirubin Urine: NEGATIVE
GLUCOSE, UA: 500 mg/dL — AB
Hgb urine dipstick: NEGATIVE
Ketones, ur: NEGATIVE mg/dL
LEUKOCYTES UA: NEGATIVE
NITRITE: NEGATIVE
PROTEIN: NEGATIVE mg/dL
SPECIFIC GRAVITY, URINE: 1.01 (ref 1.005–1.030)
UROBILINOGEN UA: 0.2 mg/dL (ref 0.0–1.0)
pH: 5 (ref 5.0–8.0)

## 2016-06-02 LAB — GLUCOSE, CAPILLARY: Glucose-Capillary: 397 mg/dL — ABNORMAL HIGH (ref 65–99)

## 2016-06-02 MED ORDER — TADALAFIL 10 MG PO TABS: 10.0000 mg | ORAL_TABLET | Freq: Every day | ORAL | 0 refills | Status: DC | PRN

## 2016-06-02 MED ORDER — LISINOPRIL 5 MG PO TABS: 5.0000 mg | ORAL_TABLET | Freq: Every day | ORAL | 3 refills | Status: DC

## 2016-06-02 MED ORDER — INSULIN ASPART PROT & ASPART (70-30 MIX) 100 UNIT/ML ~~LOC~~ SUSP: 30.0000 [IU] | Freq: Two times a day (BID) | SUBCUTANEOUS | 11 refills | Status: DC

## 2016-06-02 MED ORDER — PRAVASTATIN SODIUM 40 MG PO TABS: 40.0000 mg | ORAL_TABLET | Freq: Every day | ORAL | 3 refills | Status: DC

## 2016-06-02 MED FILL — $CIALIS 10MG TABLET: 10 | 30 days supply | Qty: 10 | Fill #0

## 2016-06-02 MED FILL — !NOVOLOG MIX 70/30 VIAL: 70-30/ML | 17 days supply | Qty: 10 | Fill #0

## 2016-06-03 ENCOUNTER — Telehealth: Payer: Self-pay

## 2016-06-03 LAB — HEMOGLOBIN A1C
Hgb A1c MFr Bld: 10.1 % — ABNORMAL HIGH (ref <5.7)
Mean Plasma Glucose: 243 mg/dL

## 2016-06-03 LAB — HEPATITIS C RNA QUANTITATIVE
HCV QUANT LOG: 3.99 {Log} — AB (ref <1.18)
HCV QUANT: 9678 [IU]/mL — AB (ref <15)

## 2016-06-03 LAB — MICROALBUMIN, URINE: Microalb, Ur: 0.6 mg/dL

## 2016-06-03 LAB — HEPATITIS C ANTIBODY: HCV AB: REACTIVE — AB

## 2016-06-03 NOTE — Telephone Encounter (Signed)
Called patient, no answer and voicemail has not been set up, I could not leave a message. Will try later. Thanks!

## 2016-06-03 NOTE — Progress Notes (Addendum)
Tony ClevelandBrandon Long, is a 40 y.o. male  MWN:[id number]  QIH:[id number]  DOB - 02/12/1976  CC:  Chief Complaint  Patient presents with  . Diabetes       HPI: Tony ClevelandBrandon Long is a 40 y.o. male here to for follow-up diabetes.He is on Novolog 70/30, 30 units bid. His A1C was 12. In Sept, is 10 today. He reports taking his insulin regularly. He does not follow a strict diet and does not exercise regularly. He complains of ED and wishes for Cialis. He is also on pravastatin 40 and lisinopril 5. He smokes about 1 pk of ccigarettes daily, drinks rare alcohol and does not use drugs.    No Known Allergies Past Medical History:  Diagnosis Date  . Diabetes mellitus without complication [facility](HCC)    Current Outpatient Prescriptions on File Prior to Visit  Medication Sig Dispense Refill  . glucose blood test strip Use as instructed 100 each 12  . Lancets (ONETOUCH ULTRASOFT) lancets Use as instructed 100 each 12  . cephALEXin (KEFLEX) 500 MG capsule Take 1 capsule (500 mg total) by mouth 2 (two) times daily. (Patient not taking: Reported on 06/02/2016) 20 capsule 0   No current facility-administered medications on file prior to visit.    Family History  Problem Relation Age of Onset  . Heart disease Father   . Diabetes Father   . Diabetes Paternal Uncle    Social History   Social History  . Marital status: Single    Spouse name: N/A  . Number of children: N/A  . Years of education: N/A   Occupational History  . Not on file.   Social History Main Topics  . Smoking status: Current Every Day Smoker    Packs/day: 1.00  . Smokeless tobacco: Never Used  . Alcohol use Yes     Comment: occ  . Drug use: No     Comment: pt mother said hx of use  . Sexual activity: Not on file   Other Topics Concern  . Not on file   Social History Narrative  . No narrative on file    Review of Systems: Constitutional: Negative Skin: Negative HENT: Negative  Eyes: Negative  Neck:  Negative Respiratory: Negative Cardiovascular: Negative Gastrointestinal: Negative Genitourinary: Negative  Musculoskeletal: Negative   Neurological: Negative for Hematological: Negative  Psychiatric/Behavioral: Negative    Objective:   Vitals:   06/02/16 1513  BP: 130/77  Pulse: 77  Resp: 16  Temp: 98.2 F (36.8 C)    Physical Exam: Constitutional: Patient appears well-developed and well-nourished. No distress. HENT: Normocephalic, atraumatic, External right and left ear normal. Oropharynx is clear and moist.  Eyes: Conjunctivae and EOM are normal. PERRLA, no scleral icterus. Neck: Normal ROM. Neck supple. No lymphadenopathy, No thyromegaly. CVS: RRR, S1/S2 +, no murmurs, no gallops, no rubs Pulmonary: Effort and breath sounds normal, no stridor, rhonchi, wheezes, rales.  Abdominal: Soft. Normoactive BS,, no distension, tenderness, rebound or guarding.  Musculoskeletal: Normal range of motion. No edema and no tenderness.  Neuro: Alert.Normal muscle tone coordination. Non-focal Skin: Skin is warm and dry. No rash noted. Not diaphoretic. No erythema. No pallor. Psychiatric: Normal mood and affect. Behavior, judgment, thought content normal.  Lab Results  Component Value Date   WBC 6.4 06/02/2016   HGB 16.2 06/02/2016   HCT 49.1 06/02/2016   MCV 95.3 06/02/2016   PLT 196 06/02/2016   Lab Results  Component Value Date   CREATININE 0.84 06/02/2016   BUN 10 06/02/2016  NA 137 06/02/2016   K 3.9 06/02/2016   CL 98 06/02/2016   CO2 29 06/02/2016    Lab Results  Component Value Date   HGBA1C 10.1 (H) 06/02/2016   Lipid Panel     Component Value Date/Time   CHOL 179 06/02/2016 1539   TRIG 110 06/02/2016 1539   HDL 51 06/02/2016 1539   CHOLHDL 3.5 06/02/2016 1539   VLDL 22 06/02/2016 1539   LDLCALC 106 (H) 06/02/2016 1539        Assessment and plan:   1. Type 2 diabetes mellitus with complication, unspecified long term insulin use status (HCC)  -  Hemoglobin A1c - Microalbumin, urine - CBC with Differential - COMPLETE METABOLIC PANEL WITH GFR - Lipid panel  2. Type 2 diabetes mellitus with diabetic neuropathy, with long-term current use of insulin (HCC)  - insulin aspart protamine- aspart (NOVOLOG MIX 70/30) (70-30) 100 UNIT/ML injection; Inject 0.3 mLs (30 Units total) into the skin 2 (two) times daily with a meal.  Dispense: 1 vial; Refill: 11 - pravastatin (PRAVACHOL) 40 MG tablet; Take 1 tablet (40 mg total) by mouth daily.  Dispense: 90 tablet; Refill: 3 - lisinopril (PRINIVIL,ZESTRIL) 5 MG tablet; Take 1 tablet (5 mg total) by mouth daily.  Dispense: 90 tablet; Refill: 3  3. Need for hepatitis C screening test  - Hepatitis C antibody, reflex  4. ED -Cialis 10 mg, #10, no RFs.   Return in about 3 months (around 08/31/2016).  The patient was given clear instructions to go to ER or return to medical center if symptoms don't improve, worsen or new problems develop. The patient verbalized understanding.    Henrietta HooverLinda C Ranon Coven FNP  06/03/2016, 7:51 AM

## 2016-06-03 NOTE — Telephone Encounter (Signed)
-----   Message from Henrietta HooverLinda C Bernhardt, NP sent at 06/03/2016  7:34 AM EST ----- Micral ok. A1C 10.1. Glucose 283, otherwise Cmet ok.Lipids ok.CBC ok. Increase 70/30 insulin to 35 units bid. Call with BS next Monday. Hep C results not back yet.

## 2016-06-04 NOTE — Telephone Encounter (Signed)
Called and spoke with patient, advised of hgba1c result and that patient should increase 70/30 insulin to 35 units twice daily. Patient  Verbalized understanding and states he will call back next Monday with Blood Sugar readings. Thanks!

## 2016-06-09 ENCOUNTER — Telehealth: Payer: Self-pay

## 2016-06-09 ENCOUNTER — Other Ambulatory Visit: Payer: Self-pay | Admitting: Family Medicine

## 2016-06-09 DIAGNOSIS — B1920 Unspecified viral hepatitis C without hepatic coma: Secondary | ICD-10-CM

## 2016-06-09 NOTE — Telephone Encounter (Signed)
Advised patient we were referring him to [facility] for positive Hep C. If he doesn't hear from them in 1 week he should call us back to inquire. He reports his blood sugars are ranging from 174-250 and he is on 35mg  of insulin. Please advise.

## 2016-06-09 NOTE — Progress Notes (Signed)
LM to CB

## 2016-06-10 NOTE — Telephone Encounter (Signed)
Increase to 40. Call with sugars next Monday.

## 2016-06-11 NOTE — Telephone Encounter (Signed)
Spoke with patient, advised for him to increase insulin to 40 units and to call next Monday with blood sugar readings. Patient verbalized understanding. Thanks!

## 2016-07-02 ENCOUNTER — Encounter: Payer: Self-pay | Admitting: Internal Medicine

## 2016-07-02 ENCOUNTER — Ambulatory Visit (INDEPENDENT_AMBULATORY_CARE_PROVIDER_SITE_OTHER): Payer: Self-pay | Admitting: Internal Medicine

## 2016-07-02 DIAGNOSIS — B182 Chronic viral hepatitis C: Secondary | ICD-10-CM | POA: Insufficient documentation

## 2016-07-02 NOTE — Progress Notes (Signed)
[facility]   CC: consideration for treatment for chronic hepatitis C  HPI:  +Tony Long is a 41 y.o. male who presents for initial evaluation and management of chronic hepatitis C.  Patient tested positive during recent routine screening. Hepatitis C-associated risk factors present are: IV drug abuse (details: no use over 1 year). Patient denies history of blood transfusion. Patient has had other studies performed. Results: hepatitis C RNA by PCR, result: positive. Patient has not had prior treatment for Hepatitis C. Patient does not have a past history of liver disease. Patient does not have a family history of liver disease. Patient does not  have associated signs or symptoms related to liver disease.  Labs reviewed and confirm chronic hepatitis C with a positive viral load.      Patient does not have documented immunity to Hepatitis A. Patient does not have documented immunity to Hepatitis B.    Review of Systems:  Constitutional: negative for fatigue and malaise Gastrointestinal: negative for diarrhea Integument/breast: negative for rash Musculoskeletal: negative for myalgias and arthralgias All other systems reviewed and are negative       Past Medical History:  Diagnosis Date  . Diabetes mellitus without complication (HCC)     Prior to Admission medications   Medication Sig Start Date End Date Taking? Authorizing Provider  glucose blood test strip Use as instructed 03/13/16  Yes Henrietta HooverLinda C Bernhardt, NP  insulin aspart protamine- aspart (NOVOLOG MIX 70/30) (70-30) 100 UNIT/ML injection Inject 0.3 mLs (30 Units total) into the skin 2 (two) times daily with a meal. 06/02/16  Yes Henrietta HooverLinda C Bernhardt, NP  Lancets [facility](ONETOUCH ULTRASOFT) lancets Use as instructed 03/13/16  Yes Henrietta HooverLinda C Bernhardt, NP  tadalafil (CIALIS) 10 MG tablet Take 1 tablet (10 mg total) by mouth daily as needed for erectile dysfunction. 06/02/16  Yes Henrietta HooverLinda C Bernhardt, NP  lisinopril  (PRINIVIL,ZESTRIL) 5 MG tablet Take 1 tablet (5 mg total) by mouth daily. Patient not taking: Reported on 07/02/2016 06/02/16   Henrietta HooverLinda C Bernhardt, NP  pravastatin (PRAVACHOL) 40 MG tablet Take 1 tablet (40 mg total) by mouth daily. Patient not taking: Reported on 07/02/2016 06/02/16   Henrietta HooverLinda C Bernhardt, NP    No Known Allergies  Social History  Substance Use Topics  . Smoking status: Current Every Day Smoker    Packs/day: 1.00  . Smokeless tobacco: Never Used  . Alcohol use Yes     Comment: seldom    Family History  Problem Relation Age of Onset  . Heart disease Father   . Diabetes Father   . Diabetes Paternal Uncle   no cirrhosis, no liver cancer   Objective:  Constitutional: in no apparent distress and alert,  Vitals:   07/02/16 0944  BP: 136/81  Pulse: 74  Temp: 98.1 F (36.7 C)   Eyes: anicteric Cardiovascular: Cor RRR and No murmurs Respiratory: CTA B; normal respiratory effort Gastrointestinal: Bowel sounds are normal, liver is not enlarged, spleen is not enlarged Musculoskeletal: no pedal edema noted Skin: negatives: no rash, multiple tattoos; no porphyria cutanea tarda Lymphatic: no cervical lymphadenopathy   Laboratory Genotype: No results found for: HCVGENOTYPE HCV viral load:  Lab Results  Component Value Date   HCVQUANT 9,678 (H) 06/02/2016   Lab Results  Component Value Date   WBC 6.4 06/02/2016   HGB 16.2 06/02/2016   HCT 49.1 06/02/2016   MCV 95.3 06/02/2016   PLT 196 06/02/2016    Lab Results  Component Value  Date   CREATININE 0.84 06/02/2016   BUN 10 06/02/2016   NA 137 06/02/2016   K 3.9 06/02/2016   CL 98 06/02/2016   CO2 29 06/02/2016    Lab Results  Component Value Date   ALT 75 (H) 06/02/2016   AST 46 (H) 06/02/2016   ALKPHOS 107 06/02/2016     Labs and history reviewed and show CHILD-PUGH A  5-6 points: Child class A 7-9 points: Child class B 10-15 points: Child class C  Lab Results  Component Value Date   BILITOT 0.3  06/02/2016   ALBUMIN 4.5 06/02/2016     Assessment: New Patient with Chronic Hepatitis C genotype unknown, untreated.  I discussed with the patient the lab findings that confirm chronic hepatitis C as well as the natural history and progression of disease including about 30% of people who develop cirrhosis of the liver if left untreated and once cirrhosis is established there is a 2-7% risk per year of liver cancer and liver failure.  I discussed the importance of treatment and benefits in reducing the risk, even if significant liver fibrosis exists.   Plan: 1) Patient counseled extensively on limiting acetaminophen to no more than 2 grams daily, avoidance of alcohol. 2) Transmission discussed with patient including sexual transmission, sharing razors and toothbrush.   3) Will need referral to gastroenterology if concern for cirrhosis 4) Will need referral for substance abuse counseling: No.; Further work up to include urine drug screen  No. 5) Will prescribe appropriate medication based on genotype and coverage  6) Hepatitis A and B titers 7) Pneumovax vaccine if not previously given 9) Further work up to include liver staging with elastography 10) will follow up after starting medication  He will come back  For labs once he gets [facility] established.  Also will then schedule for elastography

## 2016-07-04 ENCOUNTER — Ambulatory Visit: Payer: Self-pay | Attending: Internal Medicine

## 2016-07-04 MED FILL — !NOVOLOG MIX 70/30 VIAL: 70-30/ML | 30 days supply | Qty: 20 | Fill #1

## 2016-07-18 ENCOUNTER — Other Ambulatory Visit: Payer: Self-pay | Admitting: *Deleted

## 2016-07-18 DIAGNOSIS — N521 Erectile dysfunction due to diseases classified elsewhere: Secondary | ICD-10-CM

## 2016-07-18 MED ORDER — TADALAFIL 10 MG PO TABS: 10.0000 mg | ORAL_TABLET | Freq: Every day | ORAL | 3 refills | Status: DC | PRN

## 2016-07-18 NOTE — Telephone Encounter (Signed)
PRINTED FOR PASS PROGRAM 

## 2016-07-21 ENCOUNTER — Other Ambulatory Visit: Payer: Self-pay | Admitting: *Deleted

## 2016-07-21 DIAGNOSIS — Z794 Long term (current) use of insulin: Principal | ICD-10-CM

## 2016-07-21 DIAGNOSIS — E1140 Type 2 diabetes mellitus with diabetic neuropathy, unspecified: Secondary | ICD-10-CM

## 2016-07-21 MED ORDER — INSULIN ASPART PROT & ASPART (70-30 MIX) 100 UNIT/ML ~~LOC~~ SUSP: 30.0000 [IU] | Freq: Two times a day (BID) | SUBCUTANEOUS | 3 refills | Status: DC

## 2016-07-21 NOTE — Telephone Encounter (Signed)
PRINTED FOR THE PASS PROGRAM 

## 2016-09-02 ENCOUNTER — Ambulatory Visit: Payer: Self-pay | Admitting: Family Medicine

## 2016-09-12 MED FILL — !NOVOLOG MIX 70/30 VIAL: 70-30/ML | 30 days supply | Qty: 20 | Fill #2

## 2016-09-22 ENCOUNTER — Other Ambulatory Visit: Payer: Self-pay | Admitting: Pharmacist

## 2016-09-22 ENCOUNTER — Other Ambulatory Visit: Payer: Self-pay

## 2016-09-22 DIAGNOSIS — B182 Chronic viral hepatitis C: Secondary | ICD-10-CM

## 2016-09-22 LAB — CBC WITH DIFFERENTIAL/PLATELET
BASOS PCT: 1 %
Basophils Absolute: 65 cells/uL (ref 0–200)
EOS ABS: 260 {cells}/uL (ref 15–500)
Eosinophils Relative: 4 %
HEMATOCRIT: 43.7 % (ref 38.5–50.0)
Hemoglobin: 14.5 g/dL (ref 13.2–17.1)
LYMPHS PCT: 26 %
Lymphs Abs: 1690 cells/uL (ref 850–3900)
MCH: 31.5 pg (ref 27.0–33.0)
MCHC: 33.2 g/dL (ref 32.0–36.0)
MCV: 95 fL (ref 80.0–100.0)
MONO ABS: 390 {cells}/uL (ref 200–950)
MPV: 11.6 fL (ref 7.5–12.5)
Monocytes Relative: 6 %
NEUTROS PCT: 63 %
Neutro Abs: 4095 cells/uL (ref 1500–7800)
PLATELETS: 171 10*3/uL (ref 140–400)
RBC: 4.6 MIL/uL (ref 4.20–5.80)
RDW: 13.9 % (ref 11.0–15.0)
WBC: 6.5 10*3/uL (ref 3.8–10.8)

## 2016-09-22 LAB — PROTIME-INR
INR: 0.9
PROTHROMBIN TIME: 9.8 s (ref 9.0–11.5)

## 2016-09-23 ENCOUNTER — Telehealth: Payer: Self-pay | Admitting: *Deleted

## 2016-09-23 LAB — COMPLETE METABOLIC PANEL WITH GFR
ALT: 8 U/L — ABNORMAL LOW (ref 9–46)
AST: 11 U/L (ref 10–40)
Albumin: 4 g/dL (ref 3.6–5.1)
Alkaline Phosphatase: 82 U/L (ref 40–115)
BUN: 11 mg/dL (ref 7–25)
CHLORIDE: 98 mmol/L (ref 98–110)
CO2: 23 mmol/L (ref 20–31)
Calcium: 9.8 mg/dL (ref 8.6–10.3)
Creat: 0.78 mg/dL (ref 0.60–1.35)
GFR, Est Non African American: >89 mL/min (ref >=60)
Glucose, Bld: 522 mg/dL (ref 65–99)
Potassium: 4.5 mmol/L (ref 3.5–5.3)
Sodium: 135 mmol/L (ref 135–146)
Total Bilirubin: 0.3 mg/dL (ref 0.2–1.2)
Total Protein: 7.1 g/dL (ref 6.1–8.1)

## 2016-09-23 LAB — HEPATITIS B CORE ANTIBODY, TOTAL: Hep B Core Total Ab: NONREACTIVE

## 2016-09-23 LAB — HEPATITIS A ANTIBODY, TOTAL: HEP A TOTAL AB: NONREACTIVE

## 2016-09-23 LAB — HEPATITIS B SURFACE ANTIGEN: Hepatitis B Surface Ag: NEGATIVE

## 2016-09-23 LAB — HEPATITIS B SURFACE ANTIBODY,QUALITATIVE: Hep B S Ab: NEGATIVE

## 2016-09-23 LAB — HIV ANTIBODY (ROUTINE TESTING W REFLEX): HIV: NONREACTIVE

## 2016-09-23 NOTE — Telephone Encounter (Signed)
Lab called to report the patient Glucose of 522 from draw yesterday 09/22/16. Results repeated and verified.

## 2016-09-24 LAB — LIVER FIBROSIS, FIBROTEST-ACTITEST
ALT: 9 U/L (ref 9–46)
Alpha-2-Macroglobulin: 187 mg/dL (ref 106–279)
Apolipoprotein A1: 186 mg/dL — ABNORMAL HIGH (ref 94–176)
Bilirubin: 0.3 mg/dL (ref 0.2–1.2)
Fibrosis Score: 0.06
GGT: 18 U/L (ref 3–95)
Haptoglobin: 136 mg/dL (ref 43–212)
NECROINFLAMMAT ACT SCORE: 0.02
REFERENCE ID: 1878821

## 2016-09-26 LAB — HEPATITIS C GENOTYPE: HCV GENOTYPE: NOT DETECTED

## 2016-09-26 LAB — HCV RNA, QUANT REAL-TIME PCR W/REFLEX
HCV RNA, PCR, QN (LOG): NOT DETECTED {Log_IU}/mL
HCV RNA, PCR, QN: <15 IU/mL

## 2016-09-29 NOTE — Telephone Encounter (Signed)
hre will need to see his PCP for the high glucose.  He has DM.  His HCV is negative so he won't need treatment from me for the hepatitis C and Cassie was going to let him know. thanks

## 2016-09-30 ENCOUNTER — Telehealth: Payer: Self-pay | Admitting: Pharmacist

## 2016-09-30 NOTE — Telephone Encounter (Signed)
Called [place] and let him know that he cleared his Hep C.  He doesn't need any further assistance from [place].  He appreciated the call.

## 2016-09-30 NOTE — Telephone Encounter (Addendum)
Per Dr Luciana Axe called the patient and advised to follow up with his PCP for better control of his DM had to a message.

## 2016-12-11 ENCOUNTER — Ambulatory Visit (INDEPENDENT_AMBULATORY_CARE_PROVIDER_SITE_OTHER): Payer: Self-pay | Admitting: Family Medicine

## 2016-12-11 ENCOUNTER — Encounter: Payer: Self-pay | Admitting: Family Medicine

## 2016-12-11 VITALS — BP 136/78 | HR 91 | Temp 98.0°F | Resp 16 | Ht 71.0 in | Wt 155.0 lb

## 2016-12-11 DIAGNOSIS — R634 Abnormal weight loss: Secondary | ICD-10-CM

## 2016-12-11 DIAGNOSIS — Z794 Long term (current) use of insulin: Secondary | ICD-10-CM

## 2016-12-11 DIAGNOSIS — E118 Type 2 diabetes mellitus with unspecified complications: Secondary | ICD-10-CM

## 2016-12-11 DIAGNOSIS — N521 Erectile dysfunction due to diseases classified elsewhere: Secondary | ICD-10-CM

## 2016-12-11 DIAGNOSIS — E785 Hyperlipidemia, unspecified: Secondary | ICD-10-CM

## 2016-12-11 DIAGNOSIS — R739 Hyperglycemia, unspecified: Secondary | ICD-10-CM

## 2016-12-11 LAB — POCT URINALYSIS DIP (DEVICE)
Bilirubin Urine: NEGATIVE
Glucose, UA: 500 mg/dL — AB
HGB URINE DIPSTICK: NEGATIVE
LEUKOCYTES UA: NEGATIVE
NITRITE: NEGATIVE
PH: 5.5 (ref 5.0–8.0)
PROTEIN: NEGATIVE mg/dL
Specific Gravity, Urine: 1.01 (ref 1.005–1.030)
UROBILINOGEN UA: 1 mg/dL (ref 0.0–1.0)

## 2016-12-11 LAB — CBC WITH DIFFERENTIAL/PLATELET
BASOS ABS: 66 {cells}/uL (ref 0–200)
Basophils Relative: 1 %
EOS ABS: 330 {cells}/uL (ref 15–500)
EOS PCT: 5 %
HCT: 44.8 % (ref 38.5–50.0)
HEMOGLOBIN: 15 g/dL (ref 13.2–17.1)
LYMPHS ABS: 2442 {cells}/uL (ref 850–3900)
Lymphocytes Relative: 37 %
MCH: 31.5 pg (ref 27.0–33.0)
MCHC: 33.5 g/dL (ref 32.0–36.0)
MCV: 94.1 fL (ref 80.0–100.0)
MONOS PCT: 8 %
MPV: 10.9 fL (ref 7.5–12.5)
Monocytes Absolute: 528 cells/uL (ref 200–950)
NEUTROS PCT: 49 %
Neutro Abs: 3234 cells/uL (ref 1500–7800)
Platelets: 197 10*3/uL (ref 140–400)
RBC: 4.76 MIL/uL (ref 4.20–5.80)
RDW: 13.9 % (ref 11.0–15.0)
WBC: 6.6 10*3/uL (ref 3.8–10.8)

## 2016-12-11 LAB — COMPLETE METABOLIC PANEL WITH GFR
ALBUMIN: 4.6 g/dL (ref 3.6–5.1)
ALK PHOS: 85 U/L (ref 40–115)
ALT: 15 U/L (ref 9–46)
AST: 18 U/L (ref 10–40)
BILIRUBIN TOTAL: 0.6 mg/dL (ref 0.2–1.2)
BUN: 11 mg/dL (ref 7–25)
CALCIUM: 9.9 mg/dL (ref 8.6–10.3)
CO2: 24 mmol/L (ref 20–31)
Chloride: 99 mmol/L (ref 98–110)
Creat: 0.77 mg/dL (ref 0.60–1.35)
GLUCOSE: 227 mg/dL — AB (ref 65–99)
POTASSIUM: 4 mmol/L (ref 3.5–5.3)
SODIUM: 135 mmol/L (ref 135–146)
TOTAL PROTEIN: 7.9 g/dL (ref 6.1–8.1)

## 2016-12-11 LAB — GLUCOSE, CAPILLARY
Glucose-Capillary: 300 mg/dL — ABNORMAL HIGH (ref 65–99)
Glucose-Capillary: 240 mg/dL — ABNORMAL HIGH (ref 65–99)

## 2016-12-11 MED ORDER — GABAPENTIN 300 MG PO CAPS
300.0000 mg | ORAL_CAPSULE | Freq: Three times a day (TID) | ORAL | 3 refills | Status: DC
Start: 2016-12-11 — End: 2017-03-27

## 2016-12-11 MED ORDER — SILDENAFIL CITRATE 25 MG PO TABS: 25.0000 mg | ORAL_TABLET | Freq: Every day | ORAL | 0 refills | Status: DC | PRN

## 2016-12-11 MED ORDER — INJECTION DEVICE FOR INSULIN DEVI
Freq: Once | Status: AC
  Administered 2016-12-11: 11:00:00

## 2016-12-11 MED ORDER — INSULIN ASPART 100 UNIT/ML ~~LOC~~ SOLN
10.0000 [IU] | Freq: Three times a day (TID) | SUBCUTANEOUS | 11 refills | Status: DC
Start: 2016-12-11 — End: 2017-10-14

## 2016-12-11 MED ORDER — METFORMIN HCL 500 MG PO TABS: 500.0000 mg | ORAL_TABLET | Freq: Two times a day (BID) | ORAL | 3 refills | Status: DC

## 2016-12-11 MED ORDER — TADALAFIL 10 MG PO TABS: 10.0000 mg | ORAL_TABLET | Freq: Every day | ORAL | 3 refills | Status: DC | PRN

## 2016-12-11 MED ORDER — INSULIN GLARGINE 100 UNIT/ML ~~LOC~~ SOLN: 40.0000 [IU] | Freq: Every day | SUBCUTANEOUS | 11 refills | Status: DC

## 2016-12-11 MED FILL — ?METFORMIN HCL 500MG TABLET: 500 | 30 days supply | Qty: 60 | Fill #0

## 2016-12-11 MED FILL — !LANTUS 100 UNITS/ML VIAL: 100 | 25 days supply | Qty: 10 | Fill #0

## 2016-12-11 MED FILL — !NOVOLOG 100UNITS/ML VIAL: 100/ML | 33 days supply | Qty: 10 | Fill #0

## 2016-12-11 MED FILL — GABAPENTIN 300 MG CAPSULE: 300 | 30 days supply | Qty: 90 | Fill #0

## 2016-12-11 NOTE — Progress Notes (Signed)
Patient ID: Tony ClevelandBrandon Long, male    DOB: 10/16/1975, 41 y.o.   MRN: [id number]  PCP: Bing NeighborsHarris, Indica Marcott S, FNP  Chief Complaint  Patient presents with  . Follow-up    DIABETES  . Medication Refill    NOVOLOG    Subjective:  HPI Tony Long is a 41 y.o. male presents for diabetes follow-up. He has a long-standing history of uncontrolled diabetes. His last hemoglobin A1c was 6 months prior 10.1. He has very poor compliance with medication regimen. He does not consistently monitor his blood sugar at home. His current regimen includes Novolin 70/30 30 units daily and 10 units of NovoLog 3 times a day. He is not currently  taking any oral anti-diabetes medication for reasons unknown. He complains of numbness and tingling of his  fingers and toes . Denies polyuria. He does report polydipsia with chronic weight loss. Over the last 6 months he has had a weight loss  of 20 pounds without trying. He was diagnosed with hepatitis C in the past however followed up with infectious disease and  apparently cleared the infection according to most recent hep C antibody labs. Recent HIV test were negative.He is currently  out of all of his insulin medications. He also takes Cialis for erectile dysfunction and requests a refill on that medication as well today.  Lab Results  Component Value Date   HGBA1C 10.1 (H) 06/02/2016    Social History   Social History  . Marital status: Single    Spouse name: N/A  . Number of children: N/A  . Years of education: N/A   Occupational History  . Not on file.   Social History Main Topics  . Smoking status: Current Every Day Smoker    Packs/day: 1.00  . Smokeless tobacco: Never Used  . Alcohol use Yes     Comment: seldom  . Drug use: No     Comment: pt mother said hx of use  . Sexual activity: Yes    Partners: Female   Other Topics Concern  . Not on file   Social History Narrative  . No narrative on file    Family History  Problem  Relation Age of Onset  . Heart disease Father   . Diabetes Father   . Diabetes Paternal Uncle      Review of Systems  See history of present illness  Patient Active Problem List   Diagnosis Date Noted  . Chronic hepatitis C without hepatic coma (HCC) 07/02/2016  . Diabetes (HCC) 03/14/2016  . Non compliance w medication regimen 03/14/2016    No Known Allergies  Prior to Admission medications   Medication Sig Start Date End Date Taking? Authorizing Provider  glucose blood test strip Use as instructed 03/13/16  Yes Henrietta HooverBernhardt, Linda C, NP  insulin aspart protamine- aspart (NOVOLOG MIX 70/30) (70-30) 100 UNIT/ML injection Inject 0.3 mLs (30 Units total) into the skin 2 (two) times daily with a meal. 07/21/16  Yes Quentin AngstJegede, Olugbemiga E, MD  Lancets (ONETOUCH ULTRASOFT) lancets Use as instructed 03/13/16  Yes Henrietta HooverBernhardt, Linda C, NP  tadalafil (CIALIS) 10 MG tablet Take 1 tablet (10 mg total) by mouth daily as needed for erectile dysfunction. 07/18/16  Yes Quentin AngstJegede, Olugbemiga E, MD  lisinopril (PRINIVIL,ZESTRIL) 5 MG tablet Take 1 tablet (5 mg total) by mouth daily. Patient not taking: Reported on 12/11/2016 06/02/16   Henrietta HooverBernhardt, Linda C, NP  pravastatin (PRAVACHOL) 40 MG tablet Take 1 tablet (40 mg total) by mouth daily. Patient not  taking: Reported on 12/11/2016 06/02/16   Henrietta Hoover, NP    Past Medical, Surgical Family and Social History reviewed and updated.    Objective:   Today's Vitals   12/11/16 1001  BP: 136/78  Pulse: 91  Resp: 16  Temp: 98 F (36.7 C)  TempSrc: Oral  SpO2: 99%  Weight: 155 lb (70.3 kg)  Height: 5\' 11"  (1.803 m)    Wt Readings from Last 3 Encounters:  12/11/16 155 lb (70.3 kg)  07/02/16 175 lb (79.4 kg)  06/02/16 170 lb (77.1 kg)    Physical Exam  Constitutional: He is oriented to person, place, and time. He appears well-developed and well-nourished.  HENT:  Head: Normocephalic and atraumatic.  Right Ear: External ear normal.  Left Ear:  External ear normal.  Mouth/Throat: Oropharynx is clear and moist.  Eyes: Conjunctivae and EOM are normal. Pupils are equal, round, and reactive to light.  Neck: Normal range of motion. Neck supple.  Cardiovascular: Normal rate, regular rhythm, normal heart sounds and intact distal pulses.   Pulmonary/Chest: Effort normal and breath sounds normal.  Abdominal: Soft. Bowel sounds are normal. He exhibits no distension. There is no tenderness. There is no rebound and no guarding.  Musculoskeletal: Normal range of motion.  Neurological: He is alert and oriented to person, place, and time.  Skin: Skin is warm and dry.  Psychiatric: He has a normal mood and affect. His behavior is normal. Judgment and thought content normal.    Assessment & Plan:  1. Type 2 diabetes mellitus with complication, with long-term current use of insulin (HCC), uncontrolled -Current hemoglobin A1c is 12.3 -Discontinue Novolin N 70/30 -Start Lantus 40 units daily at bedtime -Start metformin 500 mg 2 times daily with meals -Continue NovoLog 10 units 3 times a day with meals  2. Hyperglycemia - injection device for insulin; by Other route once.-20 units now, Humalog - COMPLETE METABOLIC PANEL WITH GFR -CBC 3. Erectile disorder due to medical condition in male - Testosterone Total,Free,Bio, Males  4. Hyperlipidemia, unspecified hyperlipidemia type - Thyroid Panel With TSH - Continue pravastatin 40 mg daily  5. Weight loss, unintentional weight loss likely due to uncontrolled blood sugar.  -  Thyroid Panel With TSH   RTC: 1 month for blood sugar recheck.  Godfrey Pick. Tiburcio Pea, MSN, FNP-C [facility]  [address] [place], [place] [address] [phone]

## 2016-12-11 NOTE — Patient Instructions (Addendum)
Your A1C today 12.3 today and goal is <7.0.  Your blood sugars should range morning 100 - 125 fasting and at bedtime should be less than 180.  If readings are consistent greater than 200 upon awakening, call the office to notify me.  No new diabetes medication regimen is as follows: Start metformin 500 mg twice a day with meals. Start Lantus at bedtime 40 units every night. Remember to eat a small snack prior to insulin preferably a complex carbohydrates such such as peanut butter a piece of cheese and yogurt.  I have placed you on Gabapentin for your foot pain.  For weight loss continue to increase protein intake. You may purchase protein shakes and considering them either 2-3 times daily.  Return for care in one month for diabetes follow-up.   I have also refilled her lisinopril and your pravastatin these medications are important for you to continue to take as prescribed due to your uncontrolled diabetes.  I have refilled her Cialis.     Diabetes Mellitus and Food It is important for you to manage your blood sugar (glucose) level. Your blood glucose level can be greatly affected by what you eat. Eating healthier foods in the appropriate amounts throughout the day at about the same time each day will help you control your blood glucose level. It can also help slow or prevent worsening of your diabetes mellitus. Healthy eating may even help you improve the level of your blood pressure and reach or maintain a healthy weight. General recommendations for healthful eating and cooking habits include:  Eating meals and snacks regularly. Avoid going long periods of time without eating to lose weight.  Eating a diet that consists mainly of plant-based foods, such as fruits, vegetables, nuts, legumes, and whole grains.  Using low-heat cooking methods, such as baking, instead of high-heat cooking methods, such as deep frying.  Work with your dietitian to make sure you understand how to use  the Nutrition Facts information on food labels. How can food affect me? Carbohydrates Carbohydrates affect your blood glucose level more than any other type of food. Your dietitian will help you determine how many carbohydrates to eat at each meal and teach you how to count carbohydrates. Counting carbohydrates is important to keep your blood glucose at a healthy level, especially if you are using insulin or taking certain medicines for diabetes mellitus. Alcohol Alcohol can cause sudden decreases in blood glucose (hypoglycemia), especially if you use insulin or take certain medicines for diabetes mellitus. Hypoglycemia can be a life-threatening condition. Symptoms of hypoglycemia (sleepiness, dizziness, and disorientation) are similar to symptoms of having too much alcohol. If your health care provider has given you approval to drink alcohol, do so in moderation and use the following guidelines:  Women should not have more than one drink per day, and men should not have more than two drinks per day. One drink is equal to: ? 12 oz of beer. ? 5 oz of wine. ? 1 oz of hard liquor.  Do not drink on an empty stomach.  Keep yourself hydrated. Have water, diet soda, or unsweetened iced tea.  Regular soda, juice, and other mixers might contain a lot of carbohydrates and should be counted.  What foods are not recommended? As you make food choices, it is important to remember that all foods are not the same. Some foods have fewer nutrients per serving than other foods, even though they might have the same number of calories or carbohydrates. It is  difficult to get your body what it needs when you eat foods with fewer nutrients. Examples of foods that you should avoid that are high in calories and carbohydrates but low in nutrients include:  Trans fats (most processed foods list trans fats on the Nutrition Facts label).  Regular soda.  Juice.  Candy.  Sweets, such as cake, pie, doughnuts, and  cookies.  Fried foods.  What foods can I eat? Eat nutrient-rich foods, which will nourish your body and keep you healthy. The food you should eat also will depend on several factors, including:  The calories you need.  The medicines you take.  Your weight.  Your blood glucose level.  Your blood pressure level.  Your cholesterol level.  You should eat a variety of foods, including:  Protein. ? Lean cuts of meat. ? Proteins low in saturated fats, such as fish, egg whites, and beans. Avoid processed meats.  Fruits and vegetables. ? Fruits and vegetables that may help control blood glucose levels, such as apples, mangoes, and yams.  Dairy products. ? Choose fat-free or low-fat dairy products, such as milk, yogurt, and cheese.  Grains, bread, pasta, and rice. ? Choose whole grain products, such as multigrain bread, whole oats, and brown rice. These foods may help control blood pressure.  Fats. ? Foods containing healthful fats, such as nuts, avocado, olive oil, canola oil, and fish.  Does everyone with diabetes mellitus have the same meal plan? Because every person with diabetes mellitus is different, there is not one meal plan that works for everyone. It is very important that you meet with a dietitian who will help you create a meal plan that is just right for you. This information is not intended to replace advice given to you by your health care provider. Make sure you discuss any questions you have with your health care provider. Document Released: 03/13/2005 Document Revised: 11/22/2015 Document Reviewed: 05/13/2013 Elsevier Interactive Patient Education  2017 [employer]

## 2016-12-12 LAB — THYROID PANEL WITH TSH
FREE THYROXINE INDEX: 2.9 (ref 1.4–3.8)
T3 Uptake: 28 % (ref 22–35)
T4 TOTAL: 10.3 ug/dL (ref 4.5–12.0)
TSH: 3.62 m[IU]/L (ref 0.40–4.50)

## 2016-12-12 LAB — TESTOSTERONE TOTAL,FREE,BIO, MALES
ALBUMIN: 4.6 g/dL (ref 3.6–5.1)
SEX HORMONE BINDING: 86 nmol/L — AB (ref 10–50)
TESTOSTERONE: 434 ng/dL (ref 250–827)
Testosterone, Bioavailable: 50.7 ng/dL — ABNORMAL LOW (ref 110.0–575.0)
Testosterone, Free: 24.2 pg/mL — ABNORMAL LOW (ref 46.0–224.0)

## 2016-12-12 MED FILL — $CIALIS 10MG TABLET: 10 | 90 days supply | Qty: 30 | Fill #0

## 2016-12-15 LAB — POCT GLYCOSYLATED HEMOGLOBIN (HGB A1C): Hemoglobin A1C: 12.3

## 2017-01-05 MED FILL — GABAPENTIN 300 MG CAPSULE: 300 | 30 days supply | Qty: 90 | Fill #1

## 2017-01-06 ENCOUNTER — Other Ambulatory Visit: Payer: Self-pay | Admitting: *Deleted

## 2017-01-06 MED ORDER — INSULIN LISPRO 100 UNIT/ML ~~LOC~~ SOLN: 10.0000 [IU] | Freq: Three times a day (TID) | SUBCUTANEOUS | 3 refills | Status: DC

## 2017-01-06 MED ORDER — SILDENAFIL CITRATE 25 MG PO TABS: 25.0000 mg | ORAL_TABLET | Freq: Every day | ORAL | 3 refills | Status: DC | PRN

## 2017-01-06 MED ORDER — INSULIN GLARGINE 100 UNIT/ML SOLOSTAR PEN: 40.0000 [IU] | PEN_INJECTOR | Freq: Every day | SUBCUTANEOUS | 3 refills | Status: DC

## 2017-01-06 NOTE — Telephone Encounter (Signed)
PRINTED FOR PASS PROGRAM 

## 2017-01-08 MED FILL — !LANTUS 100 UNITS/ML VIAL: 100 | 25 days supply | Qty: 10 | Fill #1

## 2017-01-08 MED FILL — !NOVOLOG 100UNITS/ML VIAL: 100/ML | 33 days supply | Qty: 10 | Fill #1

## 2017-01-09 ENCOUNTER — Ambulatory Visit: Payer: Self-pay | Admitting: Family Medicine

## 2017-01-29 MED FILL — GABAPENTIN 300 MG CAPSULE: 300 | 30 days supply | Qty: 90 | Fill #2

## 2017-03-04 MED FILL — GABAPENTIN 300 MG CAPSULE: 300 | 30 days supply | Qty: 90 | Fill #3

## 2017-03-04 MED FILL — $CIALIS 10MG TABLET: 10 | 30 days supply | Qty: 10 | Fill #1

## 2017-03-27 ENCOUNTER — Other Ambulatory Visit: Payer: Self-pay | Admitting: Family Medicine

## 2017-04-14 MED FILL — !LANTUS 100 UNITS/ML VIAL: 100 | 25 days supply | Qty: 10 | Fill #2

## 2017-04-16 MED FILL — GABAPENTIN 300 MG CAPSULE: 300 | 30 days supply | Qty: 90 | Fill #0

## 2017-04-16 MED FILL — ?HUMALOG 100 UNITS/ML VIAL: 100 | 30 days supply | Qty: 10 | Fill #0

## 2017-05-18 MED FILL — GABAPENTIN 300 MG CAPSULE: 300 | 30 days supply | Qty: 90 | Fill #1

## 2017-06-15 MED FILL — GABAPENTIN 300 MG CAPSULE: 300 | 30 days supply | Qty: 90 | Fill #2

## 2017-07-13 MED FILL — GABAPENTIN 300 MG CAPSULE: 300 | 30 days supply | Qty: 90 | Fill #3

## 2017-07-21 MED FILL — $LANTUS 100 UNITS/ML VIAL: 100 | 25 days supply | Qty: 10 | Fill #3

## 2017-08-10 ENCOUNTER — Other Ambulatory Visit: Payer: Self-pay | Admitting: Family Medicine

## 2017-10-01 MED FILL — GABAPENTIN 300 MG CAPSULE: 300 | 30 days supply | Qty: 90 | Fill #0

## 2017-10-01 MED FILL — LANTUS 100 UNITS/ML VIAL: 100 | 25 days supply | Qty: 10 | Fill #4

## 2017-10-01 MED FILL — HumaLOG 100 UNIT/ML SOLN: 100 | 30 days supply | Qty: 10 | Fill #1

## 2017-10-14 ENCOUNTER — Ambulatory Visit (INDEPENDENT_AMBULATORY_CARE_PROVIDER_SITE_OTHER): Payer: Medicaid Other | Admitting: Family Medicine

## 2017-10-14 ENCOUNTER — Encounter: Payer: Self-pay | Admitting: Family Medicine

## 2017-10-14 VITALS — BP 134/74 | HR 83 | Temp 97.6°F | Resp 16 | Ht 71.0 in | Wt 160.0 lb

## 2017-10-14 DIAGNOSIS — E119 Type 2 diabetes mellitus without complications: Secondary | ICD-10-CM | POA: Diagnosis not present

## 2017-10-14 DIAGNOSIS — R220 Localized swelling, mass and lump, head: Secondary | ICD-10-CM | POA: Diagnosis not present

## 2017-10-14 DIAGNOSIS — G629 Polyneuropathy, unspecified: Secondary | ICD-10-CM

## 2017-10-14 DIAGNOSIS — K0889 Other specified disorders of teeth and supporting structures: Secondary | ICD-10-CM

## 2017-10-14 LAB — POCT URINALYSIS DIPSTICK
BILIRUBIN UA: NEGATIVE
Glucose, UA: 500
Ketones, UA: NEGATIVE
Leukocytes, UA: NEGATIVE
Nitrite, UA: NEGATIVE
PH UA: 5.5 (ref 5.0–8.0)
Protein, UA: NEGATIVE
RBC UA: NEGATIVE
Spec Grav, UA: <=1.005 — AB (ref 1.010–1.025)
UROBILINOGEN UA: 0.2 U/dL

## 2017-10-14 LAB — GLUCOSE, POCT (MANUAL RESULT ENTRY)
POC GLUCOSE: 365 mg/dL — AB (ref 70–99)
POC Glucose: 429 mg/dl — AB (ref 70–99)

## 2017-10-14 LAB — POCT GLYCOSYLATED HEMOGLOBIN (HGB A1C): Hemoglobin A1C: 12.8

## 2017-10-14 MED ORDER — KETOROLAC TROMETHAMINE 60 MG/2ML IM SOLN
60.0000 mg | Freq: Once | INTRAMUSCULAR | Status: AC
  Administered 2017-10-14: 60 mg via INTRAMUSCULAR

## 2017-10-14 MED ORDER — INSULIN GLARGINE 100 UNIT/ML SOLOSTAR PEN: 50.0000 [IU] | PEN_INJECTOR | Freq: Every day | SUBCUTANEOUS | 3 refills | Status: DC

## 2017-10-14 MED ORDER — AMOXICILLIN 500 MG PO CAPS: 500.0000 mg | ORAL_CAPSULE | Freq: Three times a day (TID) | ORAL | 0 refills | Status: AC

## 2017-10-14 MED ORDER — IBUPROFEN 600 MG PO TABS: 600.0000 mg | ORAL_TABLET | Freq: Three times a day (TID) | ORAL | 0 refills | Status: DC | PRN

## 2017-10-14 MED ORDER — GABAPENTIN 300 MG PO CAPS: 300.0000 mg | ORAL_CAPSULE | Freq: Three times a day (TID) | ORAL | 2 refills | Status: DC

## 2017-10-14 MED ORDER — LISINOPRIL 5 MG PO TABS: 5.0000 mg | ORAL_TABLET | Freq: Every day | ORAL | 3 refills | Status: DC

## 2017-10-14 MED FILL — IBUPROFEN 600 MG TABLET: 600 | 10 days supply | Qty: 30 | Fill #0

## 2017-10-14 MED FILL — AMOXICILLIN 500 MG CAPSULE: 500 | 7 days supply | Qty: 21 | Fill #0

## 2017-10-14 NOTE — Patient Instructions (Signed)
Your hemoglobin A1c is greater than 12, goal is less than 7.  Will increase Lantus to 50 units at bedtime.  We will continue Humalog 10 units prior to meals. Your blood sugar has improved to 365 prior to discharge.  If blood sugar is continuously greater than 400, I recommend that you report to the emergency department for higher level of care. We will restart lisinopril 5 mg daily.  Also, elevate lower extremities to heart level while at rest. Continue gabapentin 300 mg 3 times daily for neuropathy.

## 2017-10-14 NOTE — Progress Notes (Signed)
4

## 2017-10-14 NOTE — Progress Notes (Signed)
Chief Complaint  Patient presents with  . Diabetes  . Joint Swelling    both ankles  . Dental Pain    Subjective:    Patient ID: Tony Long, male    DOB: 02-14-1976, 42 y.o.   MRN: [id number]  HPI  Tony Long, a 42 year old male with a history of type 2 diabetes presents and hepatitis C for follow-up.  Patient has been lost to follow-up for greater than 9 months.  He states that he has been taking medications infrequently.  He has a history of very poor compliance with medication regimen.  He does not consistently monitor blood sugars at home.  Current regimen consists of Lantus 40 units at bedtime and Humalog 10 units 3 times per day for meal coverage.  Patient's blood sugar was 429 on arrival.  He states that he took 20 units of Humalog 30 minutes prior to arrival.  Patient currently endorses paresthesias primarily to fingers and toes.  He currently denies polyuria, or polyphagia , polydipsia. [name] also complaining of dental pain over the past several weeks. He endorses a broken tooth, swelling gums, and right facial swelling. Patient has taken OTC Ibuprofen without relief. Current pain intensity is 8/10 characterized as constant and throbbing.  Past Medical History:  Diagnosis Date  . Diabetes mellitus without complication [facility])    Social History   Socioeconomic History  . Marital status: Single    Spouse name: Not on file  . Number of children: Not on file  . Years of education: Not on file  . Highest education level: Not on file  Occupational History  . Not on file  Social Needs  . Financial resource strain: Not on file  . Food insecurity:    Worry: Not on file    Inability: Not on file  . Transportation needs:    Medical: Not on file    Non-medical: Not on file  Tobacco Use  . Smoking status: Current Every Day Smoker    Packs/day: 1.00  . Smokeless tobacco: Never Used  Substance and Sexual Activity  . Alcohol use: Yes    Comment: seldom  . Drug use: No      Comment: pt mother said hx of use  . Sexual activity: Yes    Partners: Female  Lifestyle  . Physical activity:    Days per week: Not on file    Minutes per session: Not on file  . Stress: Not on file  Relationships  . Social connections:    Talks on phone: Not on file    Gets together: Not on file    Attends religious service: Not on file    Active member of club or organization: Not on file    Attends meetings of clubs or organizations: Not on file    Relationship status: Not on file  . Intimate partner violence:    Fear of current or ex partner: Not on file    Emotionally abused: Not on file    Physically abused: Not on file    Forced sexual activity: Not on file  Other Topics Concern  . Not on file  Social History Narrative  . Not on file   There is no immunization history on file for this patient.   Review of Systems  Constitutional: Negative.   HENT: Positive for dental problem.   Eyes: Positive for visual disturbance (blurred vision).  Respiratory: Negative.   Cardiovascular: Positive for leg swelling (occasioally).  Gastrointestinal: Negative.   Endocrine: Negative.  Genitourinary: Negative.   Musculoskeletal: Negative.   Skin: Negative.   Allergic/Immunologic: Negative.   Neurological: Negative.   Hematological: Negative.   Psychiatric/Behavioral: Negative.        Objective:   Physical Exam  Constitutional: He is oriented to person, place, and time. He appears well-developed and well-nourished.  HENT:  Mouth/Throat: Oropharynx is clear and moist and mucous membranes are normal. Abnormal dentition. Dental caries present.    Erythema and swelling to right upper gums  Cardiovascular: Normal rate, regular rhythm, normal heart sounds and intact distal pulses.  Pulmonary/Chest: Effort normal and breath sounds normal.  Abdominal: Soft. Bowel sounds are normal.  Neurological: He is alert and oriented to person, place, and time.  Skin: Skin is warm and  dry.  Psychiatric: He has a normal mood and affect. His behavior is normal. Judgment and thought content normal.      BP 134/74 (BP Location: Left Arm, Patient Position: Sitting, Cuff Size: Normal)   Pulse 83   Temp 97.6 F (36.4 C) (Oral)   Resp 16   Ht 5\' 11"  (1.803 m)   Wt 160 lb (72.6 kg)   SpO2 100%   BMI 22.32 kg/m  Assessment & Plan:  1. Type 2 diabetes mellitus without complication, without long-term current use of insulin (HCC) CBG 429 on arrival. Patient refuses Humalog.  The patient was given clear instructions to report to the emergency department. He refuses further treatment or evaluation.  Blood sugar improved to 365.  Hemoglobin a1C is 12. 8, which is above goal. Goal is < 7. Discussed the importance of following prescribed medications consistently in order to achieve positive outcomes.  Discussed medication regimen at length.  Patient says that he often forgets to take medications. Recommend that he set reminders with smart phone - Urinalysis Dipstick - Glucose (CBG) - HgB A1c - Comprehensive metabolic panel - lisinopril (PRINIVIL,ZESTRIL) 5 MG tablet; Take 1 tablet (5 mg total) by mouth daily.  Dispense: 90 tablet; Refill: 3 - Insulin Glargine (LANTUS) 100 UNIT/ML Solostar Pen; Inject 50 Units into the skin daily at 10 pm.  Dispense: 45 mL; Refill: 3 - Glucose (CBG)  2. Neuropathy - gabapentin (NEURONTIN) 300 MG capsule; Take 1 capsule (300 mg total) by mouth 3 (three) times daily.  Dispense: 90 capsule; Refill: 2  3. Pain, dental Recommend scheduling dental appointment.  Dr. Wayland DenisEnrico Silva, general dentistry - ibuprofen (ADVIL,MOTRIN) 600 MG tablet; Take 1 tablet (600 mg total) by mouth every 8 (eight) hours as needed.  Dispense: 30 tablet; Refill: 0 - ketorolac (TORADOL) injection 60 mg  4. Swelling of gums I suspect dental abscess, will start a trial of amoxicillin 500 mg TID for 7 days.  - amoxicillin (AMOXIL) 500 MG capsule; Take 1 capsule (500 mg total)  by mouth 3 (three) times daily for 7 days.  Dispense: 21 capsule; Refill: 0   RTC: 3 months for DMII  Nolon NationsLachina Moore Latecia Miler  MSN, FNP-C Patient Care [facility] [address]  [place], [place] [address] [phone]

## 2017-10-15 ENCOUNTER — Telehealth: Payer: Self-pay

## 2017-10-15 LAB — COMPREHENSIVE METABOLIC PANEL
ALBUMIN: 4.9 g/dL (ref 3.5–5.5)
ALK PHOS: 140 IU/L — AB (ref 39–117)
ALT: 18 IU/L (ref 0–44)
AST: 17 IU/L (ref 0–40)
Albumin/Globulin Ratio: 1.4 (ref 1.2–2.2)
BUN / CREAT RATIO: 11 (ref 9–20)
BUN: 9 mg/dL (ref 6–24)
Bilirubin Total: 0.4 mg/dL (ref 0.0–1.2)
CALCIUM: 10.6 mg/dL — AB (ref 8.7–10.2)
CO2: 26 mmol/L (ref 20–29)
CREATININE: 0.81 mg/dL (ref 0.76–1.27)
Chloride: 95 mmol/L — ABNORMAL LOW (ref 96–106)
GFR calc Af Amer: 128 mL/min/{1.73_m2} (ref >59)
GFR calc non Af Amer: 110 mL/min/{1.73_m2} (ref >59)
GLUCOSE: 292 mg/dL — AB (ref 65–99)
Globulin, Total: 3.6 g/dL (ref 1.5–4.5)
Potassium: 4.4 mmol/L (ref 3.5–5.2)
Sodium: 137 mmol/L (ref 134–144)
Total Protein: 8.5 g/dL (ref 6.0–8.5)

## 2017-10-15 NOTE — Telephone Encounter (Signed)
Called and spoke with patient, advised that labs are normal and to keep next scheduled follow up. Thanks!

## 2017-10-15 NOTE — Telephone Encounter (Signed)
-----   Message from Massie MaroonLachina M Long, [place]NP sent at 10/15/2017  1:27 PM EDT ----- Regarding: lab results Please inform patient that labs are within a normal range. Follow up in clinic as scheduled.   Tony NationsLachina Moore Hollis  MSN, FNP-C Patient Care [facility] [address]  [place], [place] [address] [phone]

## 2017-10-23 MED FILL — GABAPENTIN 300 MG CAPSULE: 300 | 30 days supply | Qty: 90 | Fill #0

## 2017-11-18 MED FILL — GABAPENTIN 300 MG CAPSULE: 300 | 30 days supply | Qty: 90 | Fill #1

## 2017-12-17 MED FILL — HumaLOG 100 UNIT/ML SOLN: 100 | 30 days supply | Qty: 10 | Fill #2

## 2018-01-13 ENCOUNTER — Ambulatory Visit: Payer: Medicaid Other | Admitting: Family Medicine

## 2018-01-21 ENCOUNTER — Other Ambulatory Visit: Payer: Self-pay | Admitting: Family Medicine

## 2018-01-21 MED FILL — GABAPENTIN 300 MG CAPSULE: 300 | 30 days supply | Qty: 90 | Fill #0

## 2018-01-21 MED FILL — LANTUS 100 UNITS/ML VIAL: 100 | 25 days supply | Qty: 10 | Fill #0

## 2018-01-22 ENCOUNTER — Other Ambulatory Visit: Payer: Self-pay

## 2018-01-22 MED ORDER — INSULIN LISPRO 100 UNIT/ML ~~LOC~~ SOLN: 10.0000 [IU] | Freq: Three times a day (TID) | SUBCUTANEOUS | 3 refills | Status: DC

## 2018-01-22 MED FILL — HumaLOG 100 UNIT/ML SOLN: 100 | 28 days supply | Qty: 10 | Fill #0

## 2018-02-22 ENCOUNTER — Other Ambulatory Visit: Payer: Self-pay

## 2018-02-22 DIAGNOSIS — M545 Low back pain: Secondary | ICD-10-CM | POA: Diagnosis not present

## 2018-02-26 ENCOUNTER — Encounter: Payer: Self-pay | Admitting: Family Medicine

## 2018-02-26 ENCOUNTER — Ambulatory Visit (INDEPENDENT_AMBULATORY_CARE_PROVIDER_SITE_OTHER): Payer: Medicaid Other | Admitting: Family Medicine

## 2018-02-26 ENCOUNTER — Other Ambulatory Visit: Payer: Self-pay

## 2018-02-26 VITALS — BP 140/86 | HR 110 | Temp 99.3°F | Ht 71.0 in | Wt 155.6 lb

## 2018-02-26 DIAGNOSIS — Z09 Encounter for follow-up examination after completed treatment for conditions other than malignant neoplasm: Secondary | ICD-10-CM

## 2018-02-26 DIAGNOSIS — Z0000 Encounter for general adult medical examination without abnormal findings: Secondary | ICD-10-CM | POA: Diagnosis not present

## 2018-02-26 DIAGNOSIS — I10 Essential (primary) hypertension: Secondary | ICD-10-CM | POA: Diagnosis not present

## 2018-02-26 DIAGNOSIS — E785 Hyperlipidemia, unspecified: Secondary | ICD-10-CM | POA: Diagnosis not present

## 2018-02-26 DIAGNOSIS — Z794 Long term (current) use of insulin: Secondary | ICD-10-CM

## 2018-02-26 DIAGNOSIS — M5441 Lumbago with sciatica, right side: Secondary | ICD-10-CM | POA: Diagnosis not present

## 2018-02-26 DIAGNOSIS — Z9114 Patient's other noncompliance with medication regimen: Secondary | ICD-10-CM | POA: Diagnosis not present

## 2018-02-26 DIAGNOSIS — E119 Type 2 diabetes mellitus without complications: Secondary | ICD-10-CM

## 2018-02-26 DIAGNOSIS — G8929 Other chronic pain: Secondary | ICD-10-CM | POA: Diagnosis not present

## 2018-02-26 DIAGNOSIS — Z91148 Patient's other noncompliance with medication regimen for other reason: Secondary | ICD-10-CM

## 2018-02-26 LAB — POCT URINALYSIS DIPSTICK
Blood, UA: NEGATIVE
Glucose, UA: POSITIVE — AB
Leukocytes, UA: NEGATIVE
Nitrite, UA: NEGATIVE
Protein, UA: POSITIVE — AB
Spec Grav, UA: 1.025 (ref 1.010–1.025)
Urobilinogen, UA: 1 E.U./dL
pH, UA: 6 (ref 5.0–8.0)

## 2018-02-26 LAB — POCT GLYCOSYLATED HEMOGLOBIN (HGB A1C): Hemoglobin A1C: 12.6 % — AB (ref 4.0–5.6)

## 2018-02-26 MED ORDER — GABAPENTIN 300 MG PO CAPS: 300.0000 mg | ORAL_CAPSULE | Freq: Three times a day (TID) | ORAL | 6 refills | Status: DC

## 2018-02-26 MED ORDER — SILDENAFIL CITRATE 25 MG PO TABS: 25.0000 mg | ORAL_TABLET | Freq: Every day | ORAL | 3 refills | Status: DC | PRN

## 2018-02-26 MED ORDER — INSULIN LISPRO 100 UNIT/ML ~~LOC~~ SOLN
10.0000 [IU] | Freq: Once | SUBCUTANEOUS | Status: AC
  Administered 2018-02-26: 10 [IU] via SUBCUTANEOUS

## 2018-02-26 MED ORDER — INSULIN GLARGINE 100 UNIT/ML SOLOSTAR PEN: 50.0000 [IU] | PEN_INJECTOR | Freq: Every day | SUBCUTANEOUS | 3 refills | Status: DC

## 2018-02-26 NOTE — Progress Notes (Signed)
Follow Up  Subjective:    Patient ID: Tony Long, male    DOB: [birth year], 42 y.o.   MRN: [id number]   Chief Complaint  Patient presents with  . Follow-up    medication refill, lower back problems need referral   HPI: Tony Long is a 42 year old male with a past medical history of Diabetes. He is here today for follow up.   Current Status: Since his last office visit, he is doing well with no complaints. He states that he continues to have lower back pain, which he has been advised to see a chiropractor for relief. He states that he is not taking Lisinopril or Pravastatin, but takes all other medications as prescribed. His glucose level is elevated today. He states that he is currently taking Lantus 40 units QHS, and Humalog 10 units 3 times/day before meals.  He denies increased thirst, frequent urination, hunger, fatigue, blurred vision, excessive hunger, excessive thirst, weight gain, weight loss, and poor wound healing.   He denies fevers, chills, fatigue, recent infections, weight loss, and night sweats. He has not had any headaches, visual changes, dizziness, and falls. No chest pain, heart palpitations, cough and shortness of breath reported. No reports of GI problems such as nausea, vomiting, diarrhea, and constipation. He has no reports of blood in stools, dysuria and hematuria. No depression or anxiety reported.   Past Medical History:  Diagnosis Date  . Diabetes mellitus without complication (HCC)     Family History  Problem Relation Age of Onset  . Heart disease Father   . Diabetes Father   . Diabetes Paternal Uncle     Social History   Socioeconomic History  . Marital status: Single    Spouse name: Not on file  . Number of children: Not on file  . Years of education: Not on file  . Highest education level: Not on file  Occupational History  . Not on file  Social Needs  . Financial resource strain: Not on file  . Food insecurity:    Worry: Not on file   Inability: Not on file  . Transportation needs:    Medical: Not on file    Non-medical: Not on file  Tobacco Use  . Smoking status: Current Every Day Smoker    Packs/day: 1.00  . Smokeless tobacco: Never Used  Substance and Sexual Activity  . Alcohol use: Yes    Comment: seldom  . Drug use: No    Comment: pt mother said hx of use  . Sexual activity: Yes    Partners: Female  Lifestyle  . Physical activity:    Days per week: Not on file    Minutes per session: Not on file  . Stress: Not on file  Relationships  . Social connections:    Talks on phone: Not on file    Gets together: Not on file    Attends religious service: Not on file    Active member of club or organization: Not on file    Attends meetings of clubs or organizations: Not on file    Relationship status: Not on file  . Intimate partner violence:    Fear of current or ex partner: Not on file    Emotionally abused: Not on file    Physically abused: Not on file    Forced sexual activity: Not on file  Other Topics Concern  . Not on file  Social History Narrative  . Not on file    History reviewed.  No pertinent surgical history.   There is no immunization history on file for this patient.  Current Meds  Medication Sig  . gabapentin (NEURONTIN) 300 MG capsule Take 1 capsule (300 mg total) by mouth 3 (three) times daily.  Marland Kitchen glucose blood test strip Use as instructed  . Insulin Glargine (LANTUS) 100 UNIT/ML Solostar Pen Inject 50 Units into the skin daily at 10 pm.  . insulin lispro (HUMALOG) 100 UNIT/ML injection Inject 0.1 mLs (10 Units total) into the skin 3 (three) times daily with meals.  . Lancets (ONETOUCH ULTRASOFT) lancets Use as instructed  . sildenafil (VIAGRA) 25 MG tablet Take 1 tablet (25 mg total) by mouth daily as needed for erectile dysfunction.  . [DISCONTINUED] gabapentin (NEURONTIN) 300 MG capsule Take 1 capsule (300 mg total) by mouth 3 (three) times daily.  . [DISCONTINUED] gabapentin  (NEURONTIN) 300 MG capsule TAKE 1 CAPSULE BY MOUTH 3 TIMES DAILY.  . [DISCONTINUED] insulin glargine (LANTUS) 100 UNIT/ML injection Inject 0.4 mLs (40 Units total) into the skin at bedtime.  . [DISCONTINUED] Insulin Glargine (LANTUS) 100 UNIT/ML Solostar Pen Inject 50 Units into the skin daily at 10 pm.  . [DISCONTINUED] sildenafil (VIAGRA) 25 MG tablet Take 1 tablet (25 mg total) by mouth daily as needed for erectile dysfunction.   Current Facility-Administered Medications for the 02/26/18 encounter (Office Visit) with Tony Locks, FNP  Medication  . insulin lispro (HUMALOG) injection 10 Units    No Known Allergies  BP 140/86 (BP Location: Left Arm, Patient Position: Sitting, Cuff Size: Normal)   Pulse (!) 110   Temp 99.3 F (37.4 C)   Ht 5\' 11"  (1.803 m)   Wt 155 lb 9.6 oz (70.6 kg)   SpO2 99%   BMI 21.70 kg/m    Review of Systems  Eyes: Negative.   Respiratory: Negative.   Cardiovascular: Negative.   Endocrine:       Patient denies  Musculoskeletal: Positive for back pain (Chronic lower back pain).  Skin: Negative.   Psychiatric/Behavioral: Negative.     Objective:   Physical Exam  Constitutional: He appears well-developed and well-nourished.  Cardiovascular: Normal rate, regular rhythm and normal heart sounds.  Pulmonary/Chest: Effort normal and breath sounds normal.  Abdominal: Soft. Bowel sounds are normal.  Musculoskeletal:  Limited ROM in back.    Assessment & Plan:   1. Type 2 diabetes mellitus without complication, with long-term current use of insulin (HCC) Hgb A1c continues to be elevated at 12.6 today, from 12.8 on 10/14/2017. Urinalysis reveals Glucose level at 500. Patient states that levels are incorrect, and are around 200 when he monitors glucose 3 times a day at home. Humalog 10 units insulin in office. Patient left office before we could re-evaluate glucose level.   - Insulin Glargine (LANTUS) 100 UNIT/ML Solostar Pen; Inject 50 Units into the  skin daily at 10 pm.  Dispense: 45 mL; Refill: 3 - insulin lispro (HUMALOG) injection 10 Units - HgB A1c - Urinalysis Dipstick  2. Chronic bilateral low back pain with right-sided sciatica Moderated pain. He will continue Gabapentin as prescribed.  - Ambulatory referral to Chiropractic  3. Hyperlipidemia, unspecified hyperlipidemia type Stable.   4. Hypertension, unspecified type Blood pressure is stable at 140/86 today. He is currently not taking medications as prescribed. Educated patient on the importance of compliancy with taking prescribed medications. Patient verbalized understanding. We will continue to monitor. She will continue to decrease high sodium intake, excessive alcohol intake, increase potassium intake, smoking cessation, and  increase physical activity of at least 30 minutes of cardio activity daily. She will continue to follow Heart Healthy or DASH diet.  5. Non compliance with medication regimen He discontinued taking Lisinopril or Pravachol. Unsure of compliance of Insulin. We will re-evaluated. He will continue to decrease foods/beverages high in sugars and carbs and follow Heart Healthy or DASH diet. Increase physical activity to at least 30 minutes cardio exercise daily.   6. Healthcare maintenance Patient refused lab draw today. We will re-assess at next office.   7. Follow up He will follow up in 2 weeks for assessment of Glucose level.   Meds ordered this encounter  Medications  . gabapentin (NEURONTIN) 300 MG capsule    Sig: Take 1 capsule (300 mg total) by mouth 3 (three) times daily.    Dispense:  90 capsule    Refill:  6  . Insulin Glargine (LANTUS) 100 UNIT/ML Solostar Pen    Sig: Inject 50 Units into the skin daily at 10 pm.    Dispense:  45 mL    Refill:  3  . sildenafil (VIAGRA) 25 MG tablet    Sig: Take 1 tablet (25 mg total) by mouth daily as needed for erectile dysfunction.    Dispense:  90 tablet    Refill:  3  . insulin lispro (HUMALOG)  injection 10 Units    Raliegh IpNatalie Jozlyn Schatz,  MSN, FNP-C Patient [facility] [address]  [place], [place] [address] [phone]

## 2018-03-04 ENCOUNTER — Other Ambulatory Visit: Payer: Self-pay | Admitting: Family Medicine

## 2018-03-04 ENCOUNTER — Other Ambulatory Visit: Payer: Self-pay

## 2018-03-04 DIAGNOSIS — M9903 Segmental and somatic dysfunction of lumbar region: Secondary | ICD-10-CM | POA: Diagnosis not present

## 2018-03-04 DIAGNOSIS — M9905 Segmental and somatic dysfunction of pelvic region: Secondary | ICD-10-CM | POA: Diagnosis not present

## 2018-03-04 DIAGNOSIS — M5416 Radiculopathy, lumbar region: Secondary | ICD-10-CM | POA: Diagnosis not present

## 2018-03-04 DIAGNOSIS — M5431 Sciatica, right side: Secondary | ICD-10-CM | POA: Diagnosis not present

## 2018-03-04 MED ORDER — INSULIN LISPRO 100 UNIT/ML ~~LOC~~ SOLN: 10.0000 [IU] | Freq: Three times a day (TID) | SUBCUTANEOUS | 3 refills | Status: DC

## 2018-03-04 MED ORDER — INSULIN PEN NEEDLE 31G X 5 MM MISC: 10.0000 mg | 2 refills | Status: AC

## 2018-03-04 NOTE — Telephone Encounter (Signed)
Patient stated that medication didn't get sent to pharmacy. Medication sent

## 2018-03-07 ENCOUNTER — Other Ambulatory Visit: Payer: Self-pay

## 2018-03-07 ENCOUNTER — Emergency Department
Admission: EM | Admit: 2018-03-07 | Discharge: 2018-03-07 | Disposition: A | Discharge status: Home / Self Care | Payer: Medicaid Other | Attending: Emergency Medicine | Admitting: Emergency Medicine

## 2018-03-07 DIAGNOSIS — E119 Type 2 diabetes mellitus without complications: Secondary | ICD-10-CM | POA: Diagnosis not present

## 2018-03-07 DIAGNOSIS — M5441 Lumbago with sciatica, right side: Secondary | ICD-10-CM | POA: Diagnosis not present

## 2018-03-07 DIAGNOSIS — Z794 Long term (current) use of insulin: Secondary | ICD-10-CM | POA: Diagnosis not present

## 2018-03-07 DIAGNOSIS — M545 Low back pain: Secondary | ICD-10-CM | POA: Diagnosis present

## 2018-03-07 DIAGNOSIS — M62830 Muscle spasm of back: Secondary | ICD-10-CM | POA: Insufficient documentation

## 2018-03-07 DIAGNOSIS — F17200 Nicotine dependence, unspecified, uncomplicated: Secondary | ICD-10-CM | POA: Insufficient documentation

## 2018-03-07 DIAGNOSIS — Z79899 Other long term (current) drug therapy: Secondary | ICD-10-CM | POA: Insufficient documentation

## 2018-03-07 MED ORDER — ORPHENADRINE CITRATE 30 MG/ML IJ SOLN: 60.0000 mg | Freq: Two times a day (BID) | INTRAMUSCULAR | Status: DC

## 2018-03-07 MED ORDER — CYCLOBENZAPRINE HCL 5 MG PO TABS: 5.0000 mg | ORAL_TABLET | Freq: Three times a day (TID) | ORAL | 0 refills | Status: DC | PRN

## 2018-03-07 MED ORDER — ETODOLAC 500 MG PO TABS: 500.0000 mg | ORAL_TABLET | Freq: Two times a day (BID) | ORAL | 0 refills | Status: DC

## 2018-03-07 MED ORDER — MORPHINE SULFATE (PF) 4 MG/ML IV SOLN
4.0000 mg | Freq: Once | INTRAVENOUS | Status: AC
  Administered 2018-03-07: 4 mg via INTRAMUSCULAR
  Filled 2018-03-07: qty 1

## 2018-03-07 MED ORDER — ORPHENADRINE CITRATE 30 MG/ML IJ SOLN
60.0000 mg | Freq: Once | INTRAMUSCULAR | Status: AC
  Administered 2018-03-07: 60 mg via INTRAMUSCULAR
  Filled 2018-03-07: qty 2

## 2018-03-07 MED ORDER — KETOROLAC TROMETHAMINE 30 MG/ML IJ SOLN
30.0000 mg | Freq: Once | INTRAMUSCULAR | Status: AC
  Administered 2018-03-07: 30 mg via INTRAMUSCULAR
  Filled 2018-03-07: qty 1

## 2018-03-07 MED ORDER — HYDROCODONE-ACETAMINOPHEN 5-325 MG PO TABS: 1.0000 | ORAL_TABLET | Freq: Four times a day (QID) | ORAL | 0 refills | Status: DC | PRN

## 2018-03-07 MED ORDER — MORPHINE SULFATE (PF) 4 MG/ML IV SOLN: 4.0000 mg | Freq: Once | INTRAVENOUS | Status: DC

## 2018-03-07 NOTE — ED Provider Notes (Signed)
[facility] EMERGENCY DEPARTMENT Provider Note   CSN: [id number] Arrival date & time: 03/07/18  2148     History   Chief Complaint Chief Complaint  Patient presents with  . Back Pain    HPI Tony Long is a 42 y.o. male presents to the emergency department for evaluation of acute bilateral low back pain.  Patient states 3 weeks ago he developed tightness and spasms in the left and right lower back.  [facility], x-rays were obtained and negative, patient was given a 6-day prednisone taper with methocarbamol prescription.  Patient states for the 6 days he was on the prednisone he felt great after the prednisone taper his symptoms began to return.  He took methocarbamol as prescribed for 2 weeks with no improvement.  He denies any trauma or injury.  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  Occasionally will have some numbness and tingling going down his right leg but this is mild and intermittent.  No loss of bowel or bladder symptoms.  He denies any abdominal pain.  No fevers chills or night sweats.  After running out of methocarbamol has been taking Tylenol and ibuprofen with minimal relief.  His pain is moderate to severe.  Pain is increased with standing and with leaning forward, he describes severe tightness in the left lower back.  HPI  Past Medical History:  Diagnosis Date  . Diabetes mellitus without complication [facility])     Patient Active Problem List   Diagnosis Date Noted  . Chronic hepatitis C without hepatic coma (HCC) 07/02/2016  . Diabetes (HCC) 03/14/2016  . Non compliance w medication regimen 03/14/2016    History reviewed. No pertinent surgical history.      Home Medications    Prior to Admission medications   Medication Sig Start Date End Date Taking? Authorizing Provider  cyclobenzaprine (FLEXERIL) 5 MG tablet Take 1-2 tablets (5-10 mg total) by mouth 3 (three) times  daily as needed for muscle spasms. 03/07/18   Evon Slack, PA-C  etodolac (LODINE) 500 MG tablet Take 1 tablet (500 mg total) by mouth 2 (two) times daily. 03/07/18   Evon Slack, PA-C  gabapentin (NEURONTIN) 300 MG capsule Take 1 capsule (300 mg total) by mouth 3 (three) times daily. 02/26/18   [name], FNP  glucose blood test strip Use as instructed 03/13/16   Henrietta Hoover, NP  HYDROcodone-acetaminophen (NORCO) 5-325 MG tablet Take 1 tablet by mouth every 6 (six) hours as needed for moderate pain. 03/07/18   Evon Slack, PA-C  ibuprofen (ADVIL,MOTRIN) 600 MG tablet Take 1 tablet (600 mg total) by mouth every 8 (eight) hours as needed. Patient not taking: Reported on 02/26/2018 10/14/17   [name], FNP  Insulin Glargine (LANTUS) 100 UNIT/ML Solostar Pen Inject 50 Units into the skin daily at 10 pm. 02/26/18   [name], FNP  insulin lispro (HUMALOG) 100 UNIT/ML injection Inject 0.1 mLs (10 Units total) into the skin 3 (three) times daily with meals. 03/04/18   [name], FNP  Lancets [facility] ULTRASOFT) lancets Use as instructed 03/13/16   Henrietta Hoover, NP  LANTUS 100 UNIT/ML injection INJECT 40 UNITS TOTAL INTO THE SKIN AT BEDTIME. 03/04/18   [name], FNP  lisinopril (PRINIVIL,ZESTRIL) 5 MG tablet Take 1 tablet (5 mg total) by mouth daily. Patient not taking: Reported on 02/26/2018 10/14/17   Hart Rochester,  Rosalene Billings, FNP  metFORMIN (GLUCOPHAGE) 500 MG tablet Take 1 tablet (500 mg total) by mouth 2 (two) times daily with a meal. Patient not taking: Reported on 10/14/2017 12/11/16   Bing Neighbors, FNP  pravastatin (PRAVACHOL) 40 MG tablet Take 1 tablet (40 mg total) by mouth daily. Patient not taking: Reported on 12/11/2016 06/02/16   Henrietta Hoover, NP  sildenafil (VIAGRA) 25 MG tablet Take 1 tablet (25 mg total) by mouth daily as needed for erectile dysfunction. 02/26/18   [name], FNP    Family History Family History  Problem  Relation Age of Onset  . Heart disease Father   . Diabetes Father   . Diabetes Paternal Uncle     Social History Social History   Tobacco Use  . Smoking status: Current Every Day Smoker    Packs/day: 1.00  . Smokeless tobacco: Never Used  Substance Use Topics  . Alcohol use: Not Currently    Comment: seldom  . Drug use: No    Comment: pt mother said hx of use     Allergies   Patient has no known allergies.   Review of Systems Review of Systems  Constitutional: Negative for chills and fever.  Respiratory: Negative for shortness of breath.   Cardiovascular: Negative for chest pain.  Gastrointestinal: Negative for abdominal pain, nausea and vomiting.  Musculoskeletal: Positive for back pain. Negative for arthralgias, gait problem, joint swelling and myalgias.  Skin: Negative for rash.  Neurological: Positive for numbness. Negative for dizziness and headaches.     Physical Exam Updated Vital Signs BP (!) 155/96   Pulse (!) 113   Temp 98 F (36.7 C)   Resp 18   Ht 5\' 11"  (1.803 m)   Wt 70 kg   SpO2 98%   BMI 21.52 kg/m   Physical Exam  Constitutional: He is oriented to person, place, and time. He appears well-developed and well-nourished.  HENT:  Head: Normocephalic and atraumatic.  Eyes: Conjunctivae are normal.  Neck: Normal range of motion.  Cardiovascular: Normal rate.  Pulmonary/Chest: Effort normal. No respiratory distress.  Abdominal: Soft. He exhibits no distension. There is no tenderness. There is no guarding.  Musculoskeletal:  Lumbar Spine: Examination of the lumbar spine reveals no bony abnormality, no edema, and no ecchymosis. No warmth or redness.  No fluctuance.  No sign of pilonidal abscess.  There is no step off.  The patient has decreased range of motion of the lumbar spine with flexion and normal range of motion with extension.  moderate pain with attempted lumbar flexion.  The patient is non tender along the spinous process.  The patient is  moderately tender along the left lower lumbar paravertebral muscles as well as the right lower lumbar paravertebral muscles with muscle spasms noted on the left greater than right side of the lumbar spine.  The patient is non tender along the iliac crest.  The patient is non tender in the sciatic notch.  The patient is non tender along the Sacroiliac joint.  There is no Coccyx joint tenderness.    Bilateral Lower Extremities: Examination of the lower extremities reveals no bony abnormality, no edema, and no ecchymosis.  The patient has full active and passive range of motion of the hips, knees, and ankles.  There is no discomfort with range of motion exercises.  The patient is non tender along the greater trochanter region.  The patient has a negative Denna Haggard' test bilaterally.  There is normal skin warmth.  There is normal capillary refill bilaterally.    Neurologic: The patient has a negative straight leg raise.  The patient has normal muscle strength testing for the quadriceps, calves, ankle dorsiflexion, ankle plantarflexion, and extensor hallicus longus.  The patient has sensation that is intact to light touch.  The patella deep tendon reflexes are normal bilaterally.  Neurological: He is alert and oriented to person, place, and time.  Skin: Skin is warm. No rash noted.  Psychiatric: He has a normal mood and affect. His behavior is normal. Thought content normal.     ED Treatments / Results  Labs (all labs ordered are listed, but only abnormal results are displayed) Labs Reviewed - No data to display  EKG None  Radiology No results found.  Procedures Procedures (including critical care time)  Medications Ordered in ED Medications  morphine 4 MG/ML injection 4 mg (4 mg Intramuscular Given 03/07/18 2247)  orphenadrine (NORFLEX) injection 60 mg (60 mg Intramuscular Given 03/07/18 2248)  ketorolac (TORADOL) 30 MG/ML injection 30 mg (30 mg Intramuscular Given 03/07/18 2244)     Initial  Impression / Assessment and Plan / ED Course  I have reviewed the triage vital signs and the nursing notes.  Pertinent labs & imaging results that were available during my care of the patient were reviewed by me and considered in my medical decision making (see chart for details).     41 year old male with acute low back pain with bilateral lower lumbar muscle spasms with very mild intermittent lumbar radiculopathy down the right leg with no weakness or neurological deficit.  Patient given Toradol, morphine, Norflex and saw improvement with pain, tightness in the lower back as well as with improved mobility and ability to forward flex.  Patient was less tender on exam.  Patient's discharged home with Norco, etodolac, cyclobenzaprine.  He will call orthopedics next week in follow-up.  He understands signs symptoms return to ED for.  Final Clinical Impressions(s) / ED Diagnoses   Final diagnoses:  Acute bilateral low back pain with right-sided sciatica  Muscle spasm of back    ED Discharge Orders         Ordered    HYDROcodone-acetaminophen (NORCO) 5-325 MG tablet  Every 6 hours PRN     03/07/18 2305    etodolac (LODINE) 500 MG tablet  2 times daily     03/07/18 2305    cyclobenzaprine (FLEXERIL) 5 MG tablet  3 times daily PRN     03/07/18 2305           Evon Slack, PA-C 03/07/18 2307    Emily Filbert, MD 03/07/18 2326

## 2018-03-07 NOTE — Discharge Instructions (Addendum)
Please take medications as prescribed as needed for pain.  Please follow-up with orthopedic provider next week.  Return to the ER for any increasing pain, fevers, weakness, numbness, tingling, worsening symptoms or to changes in health.

## 2018-03-07 NOTE — ED Triage Notes (Signed)
States back pain x 3 weeks. Was seen at the ortho urgent care 3 weeks ago and placed on muscle relaxers and steroids. Also saw chiropractor Thursday who adjusted his back but states feels worse.

## 2018-03-07 NOTE — ED Triage Notes (Signed)
Patient to ED for complaints of abscess at the lower back and top of his buttocks. Has seen three different providers without relief of symptoms. Today saw a visible knot come up at the lower back. Difficult to sit for any period of time and difficult to stand for long periods.

## 2018-03-20 ENCOUNTER — Inpatient Hospital Stay (HOSPITAL_COMMUNITY)
Admission: AD | Admit: 2018-03-20 | Discharge: 2018-04-17 | DRG: 094 | Disposition: A | Discharge status: Home / Self Care | Payer: Medicaid Other | Source: Other Acute Inpatient Hospital | Attending: Internal Medicine | Admitting: Internal Medicine

## 2018-03-20 ENCOUNTER — Emergency Department
Admission: EM | Admit: 2018-03-20 | Discharge: 2018-03-20 | Disposition: A | Discharge status: Other Acute Inpatient Hospital | Payer: Medicaid Other | Attending: Emergency Medicine | Admitting: Emergency Medicine

## 2018-03-20 ENCOUNTER — Encounter (HOSPITAL_COMMUNITY): Payer: Self-pay

## 2018-03-20 ENCOUNTER — Emergency Department: Payer: Medicaid Other

## 2018-03-20 ENCOUNTER — Other Ambulatory Visit: Payer: Self-pay

## 2018-03-20 ENCOUNTER — Inpatient Hospital Stay (HOSPITAL_COMMUNITY): Payer: Medicaid Other

## 2018-03-20 DIAGNOSIS — Z91148 Patient's other noncompliance with medication regimen for other reason: Secondary | ICD-10-CM

## 2018-03-20 DIAGNOSIS — F17210 Nicotine dependence, cigarettes, uncomplicated: Secondary | ICD-10-CM | POA: Diagnosis present

## 2018-03-20 DIAGNOSIS — R339 Retention of urine, unspecified: Secondary | ICD-10-CM | POA: Diagnosis not present

## 2018-03-20 DIAGNOSIS — R64 Cachexia: Secondary | ICD-10-CM | POA: Diagnosis present

## 2018-03-20 DIAGNOSIS — Z8249 Family history of ischemic heart disease and other diseases of the circulatory system: Secondary | ICD-10-CM

## 2018-03-20 DIAGNOSIS — M609 Myositis, unspecified: Secondary | ICD-10-CM | POA: Diagnosis not present

## 2018-03-20 DIAGNOSIS — M899 Disorder of bone, unspecified: Secondary | ICD-10-CM | POA: Diagnosis not present

## 2018-03-20 DIAGNOSIS — D509 Iron deficiency anemia, unspecified: Secondary | ICD-10-CM | POA: Diagnosis present

## 2018-03-20 DIAGNOSIS — F17200 Nicotine dependence, unspecified, uncomplicated: Secondary | ICD-10-CM | POA: Diagnosis not present

## 2018-03-20 DIAGNOSIS — Z833 Family history of diabetes mellitus: Secondary | ICD-10-CM | POA: Diagnosis not present

## 2018-03-20 DIAGNOSIS — B9561 Methicillin susceptible Staphylococcus aureus infection as the cause of diseases classified elsewhere: Secondary | ICD-10-CM | POA: Diagnosis present

## 2018-03-20 DIAGNOSIS — K5900 Constipation, unspecified: Secondary | ICD-10-CM | POA: Diagnosis not present

## 2018-03-20 DIAGNOSIS — Z66 Do not resuscitate: Secondary | ICD-10-CM | POA: Diagnosis not present

## 2018-03-20 DIAGNOSIS — R0602 Shortness of breath: Secondary | ICD-10-CM | POA: Diagnosis not present

## 2018-03-20 DIAGNOSIS — Z95828 Presence of other vascular implants and grafts: Secondary | ICD-10-CM | POA: Diagnosis not present

## 2018-03-20 DIAGNOSIS — D638 Anemia in other chronic diseases classified elsewhere: Secondary | ICD-10-CM | POA: Diagnosis not present

## 2018-03-20 DIAGNOSIS — E1169 Type 2 diabetes mellitus with other specified complication: Secondary | ICD-10-CM | POA: Diagnosis not present

## 2018-03-20 DIAGNOSIS — I959 Hypotension, unspecified: Secondary | ICD-10-CM | POA: Diagnosis not present

## 2018-03-20 DIAGNOSIS — L02212 Cutaneous abscess of back [any part, except buttock]: Secondary | ICD-10-CM | POA: Diagnosis present

## 2018-03-20 DIAGNOSIS — B182 Chronic viral hepatitis C: Secondary | ICD-10-CM | POA: Diagnosis present

## 2018-03-20 DIAGNOSIS — I10 Essential (primary) hypertension: Secondary | ICD-10-CM | POA: Diagnosis not present

## 2018-03-20 DIAGNOSIS — F1410 Cocaine abuse, uncomplicated: Secondary | ICD-10-CM | POA: Diagnosis present

## 2018-03-20 DIAGNOSIS — R0781 Pleurodynia: Secondary | ICD-10-CM | POA: Diagnosis not present

## 2018-03-20 DIAGNOSIS — L03312 Cellulitis of back [any part except buttock]: Secondary | ICD-10-CM | POA: Diagnosis not present

## 2018-03-20 DIAGNOSIS — A419 Sepsis, unspecified organism: Secondary | ICD-10-CM | POA: Diagnosis not present

## 2018-03-20 DIAGNOSIS — L03317 Cellulitis of buttock: Secondary | ICD-10-CM | POA: Diagnosis not present

## 2018-03-20 DIAGNOSIS — I368 Other nonrheumatic tricuspid valve disorders: Secondary | ICD-10-CM | POA: Diagnosis not present

## 2018-03-20 DIAGNOSIS — R531 Weakness: Secondary | ICD-10-CM | POA: Diagnosis not present

## 2018-03-20 DIAGNOSIS — Z6820 Body mass index (BMI) 20.0-20.9, adult: Secondary | ICD-10-CM

## 2018-03-20 DIAGNOSIS — M545 Low back pain: Secondary | ICD-10-CM | POA: Diagnosis not present

## 2018-03-20 DIAGNOSIS — R071 Chest pain on breathing: Secondary | ICD-10-CM

## 2018-03-20 DIAGNOSIS — M869 Osteomyelitis, unspecified: Secondary | ICD-10-CM | POA: Diagnosis not present

## 2018-03-20 DIAGNOSIS — R918 Other nonspecific abnormal finding of lung field: Secondary | ICD-10-CM | POA: Diagnosis not present

## 2018-03-20 DIAGNOSIS — E0810 Diabetes mellitus due to underlying condition with ketoacidosis without coma: Secondary | ICD-10-CM

## 2018-03-20 DIAGNOSIS — Z9114 Patient's other noncompliance with medication regimen: Secondary | ICD-10-CM | POA: Diagnosis not present

## 2018-03-20 DIAGNOSIS — E1010 Type 1 diabetes mellitus with ketoacidosis without coma: Secondary | ICD-10-CM

## 2018-03-20 DIAGNOSIS — I079 Rheumatic tricuspid valve disease, unspecified: Secondary | ICD-10-CM | POA: Diagnosis not present

## 2018-03-20 DIAGNOSIS — I361 Nonrheumatic tricuspid (valve) insufficiency: Secondary | ICD-10-CM | POA: Diagnosis not present

## 2018-03-20 DIAGNOSIS — F1123 Opioid dependence with withdrawal: Secondary | ICD-10-CM | POA: Diagnosis not present

## 2018-03-20 DIAGNOSIS — R7881 Bacteremia: Secondary | ICD-10-CM

## 2018-03-20 DIAGNOSIS — L0231 Cutaneous abscess of buttock: Secondary | ICD-10-CM | POA: Diagnosis not present

## 2018-03-20 DIAGNOSIS — Z794 Long term (current) use of insulin: Secondary | ICD-10-CM | POA: Diagnosis not present

## 2018-03-20 DIAGNOSIS — M5011 Cervical disc disorder with radiculopathy,  high cervical region: Secondary | ICD-10-CM | POA: Diagnosis not present

## 2018-03-20 DIAGNOSIS — M50223 Other cervical disc displacement at C6-C7 level: Secondary | ICD-10-CM | POA: Diagnosis not present

## 2018-03-20 DIAGNOSIS — Y92230 Patient room in hospital as the place of occurrence of the external cause: Secondary | ICD-10-CM | POA: Diagnosis not present

## 2018-03-20 DIAGNOSIS — G92 Toxic encephalopathy: Secondary | ICD-10-CM | POA: Diagnosis not present

## 2018-03-20 DIAGNOSIS — G061 Intraspinal abscess and granuloma: Principal | ICD-10-CM | POA: Diagnosis present

## 2018-03-20 DIAGNOSIS — R6521 Severe sepsis with septic shock: Secondary | ICD-10-CM | POA: Diagnosis not present

## 2018-03-20 DIAGNOSIS — L03221 Cellulitis of neck: Secondary | ICD-10-CM | POA: Diagnosis present

## 2018-03-20 DIAGNOSIS — E876 Hypokalemia: Secondary | ICD-10-CM | POA: Diagnosis not present

## 2018-03-20 DIAGNOSIS — M4626 Osteomyelitis of vertebra, lumbar region: Secondary | ICD-10-CM | POA: Diagnosis present

## 2018-03-20 DIAGNOSIS — F1911 Other psychoactive substance abuse, in remission: Secondary | ICD-10-CM | POA: Diagnosis not present

## 2018-03-20 DIAGNOSIS — Z79899 Other long term (current) drug therapy: Secondary | ICD-10-CM | POA: Diagnosis not present

## 2018-03-20 DIAGNOSIS — L0291 Cutaneous abscess, unspecified: Secondary | ICD-10-CM

## 2018-03-20 DIAGNOSIS — E119 Type 2 diabetes mellitus without complications: Secondary | ICD-10-CM | POA: Diagnosis not present

## 2018-03-20 DIAGNOSIS — Z8619 Personal history of other infectious and parasitic diseases: Secondary | ICD-10-CM

## 2018-03-20 DIAGNOSIS — E1110 Type 2 diabetes mellitus with ketoacidosis without coma: Secondary | ICD-10-CM | POA: Diagnosis present

## 2018-03-20 DIAGNOSIS — F1990 Other psychoactive substance use, unspecified, uncomplicated: Secondary | ICD-10-CM | POA: Diagnosis not present

## 2018-03-20 DIAGNOSIS — E11649 Type 2 diabetes mellitus with hypoglycemia without coma: Secondary | ICD-10-CM | POA: Diagnosis not present

## 2018-03-20 DIAGNOSIS — E872 Acidosis: Secondary | ICD-10-CM | POA: Diagnosis not present

## 2018-03-20 DIAGNOSIS — M546 Pain in thoracic spine: Secondary | ICD-10-CM | POA: Diagnosis not present

## 2018-03-20 DIAGNOSIS — M5114 Intervertebral disc disorders with radiculopathy, thoracic region: Secondary | ICD-10-CM | POA: Diagnosis not present

## 2018-03-20 DIAGNOSIS — R079 Chest pain, unspecified: Secondary | ICD-10-CM

## 2018-03-20 DIAGNOSIS — T50995A Adverse effect of other drugs, medicaments and biological substances, initial encounter: Secondary | ICD-10-CM | POA: Diagnosis not present

## 2018-03-20 DIAGNOSIS — F1120 Opioid dependence, uncomplicated: Secondary | ICD-10-CM

## 2018-03-20 DIAGNOSIS — M542 Cervicalgia: Secondary | ICD-10-CM | POA: Diagnosis not present

## 2018-03-20 DIAGNOSIS — I330 Acute and subacute infective endocarditis: Secondary | ICD-10-CM | POA: Diagnosis present

## 2018-03-20 DIAGNOSIS — F1110 Opioid abuse, uncomplicated: Secondary | ICD-10-CM | POA: Diagnosis not present

## 2018-03-20 DIAGNOSIS — J90 Pleural effusion, not elsewhere classified: Secondary | ICD-10-CM | POA: Diagnosis not present

## 2018-03-20 DIAGNOSIS — M6008 Infective myositis, other site: Secondary | ICD-10-CM | POA: Diagnosis present

## 2018-03-20 DIAGNOSIS — R159 Full incontinence of feces: Secondary | ICD-10-CM | POA: Diagnosis not present

## 2018-03-20 DIAGNOSIS — M549 Dorsalgia, unspecified: Secondary | ICD-10-CM | POA: Diagnosis not present

## 2018-03-20 DIAGNOSIS — M5124 Other intervertebral disc displacement, thoracic region: Secondary | ICD-10-CM | POA: Diagnosis not present

## 2018-03-20 DIAGNOSIS — M4802 Spinal stenosis, cervical region: Secondary | ICD-10-CM | POA: Diagnosis not present

## 2018-03-20 DIAGNOSIS — E118 Type 2 diabetes mellitus with unspecified complications: Secondary | ICD-10-CM | POA: Diagnosis not present

## 2018-03-20 DIAGNOSIS — R0789 Other chest pain: Secondary | ICD-10-CM | POA: Diagnosis not present

## 2018-03-20 LAB — URINALYSIS, ROUTINE W REFLEX MICROSCOPIC
BACTERIA UA: NONE SEEN
Bilirubin Urine: NEGATIVE
Glucose, UA: >500 mg/dL — AB
Ketones, ur: 80 mg/dL — AB
Leukocytes, UA: NEGATIVE
Nitrite: NEGATIVE
PROTEIN: NEGATIVE mg/dL
Specific Gravity, Urine: 1.015 (ref 1.005–1.030)
pH: 5 (ref 5.0–8.0)

## 2018-03-20 LAB — CBC WITH DIFFERENTIAL/PLATELET
Basophils Absolute: 0.2 10*3/uL — ABNORMAL HIGH (ref 0–0.1)
Basophils Relative: 1 %
Eosinophils Absolute: 0 10*3/uL (ref 0–0.7)
Eosinophils Relative: 0 %
HEMATOCRIT: 32.1 % — AB (ref 40.0–52.0)
HEMOGLOBIN: 10.5 g/dL — AB (ref 13.0–18.0)
LYMPHS ABS: 0.8 10*3/uL — AB (ref 1.0–3.6)
Lymphocytes Relative: 4 %
MCH: 31.3 pg (ref 26.0–34.0)
MCHC: 32.7 g/dL (ref 32.0–36.0)
MCV: 95.6 fL (ref 80.0–100.0)
MONOS PCT: 6 %
Monocytes Absolute: 1.4 10*3/uL — ABNORMAL HIGH (ref 0.2–1.0)
NEUTROS ABS: 19.7 10*3/uL — AB (ref 1.4–6.5)
NEUTROS PCT: 89 %
Platelets: 388 10*3/uL (ref 150–440)
RBC: 3.36 MIL/uL — ABNORMAL LOW (ref 4.40–5.90)
RDW: 14.3 % (ref 11.5–14.5)
WBC: 22.2 10*3/uL — ABNORMAL HIGH (ref 3.8–10.6)

## 2018-03-20 LAB — GLUCOSE, CAPILLARY
GLUCOSE-CAPILLARY: 352 mg/dL — AB (ref 70–99)
GLUCOSE-CAPILLARY: 346 mg/dL — AB (ref 70–99)
Glucose-Capillary: 358 mg/dL — ABNORMAL HIGH (ref 70–99)
Glucose-Capillary: 343 mg/dL — ABNORMAL HIGH (ref 70–99)
Glucose-Capillary: 294 mg/dL — ABNORMAL HIGH (ref 70–99)
Glucose-Capillary: 247 mg/dL — ABNORMAL HIGH (ref 70–99)
Glucose-Capillary: 231 mg/dL — ABNORMAL HIGH (ref 70–99)
Glucose-Capillary: 257 mg/dL — ABNORMAL HIGH (ref 70–99)
Glucose-Capillary: 258 mg/dL — ABNORMAL HIGH (ref 70–99)

## 2018-03-20 LAB — RAPID URINE DRUG SCREEN, HOSP PERFORMED
AMPHETAMINES: NOT DETECTED
BENZODIAZEPINES: NOT DETECTED
Barbiturates: NOT DETECTED
Cocaine: POSITIVE — AB
OPIATES: POSITIVE — AB
Tetrahydrocannabinol: NOT DETECTED

## 2018-03-20 LAB — COMPREHENSIVE METABOLIC PANEL
ALBUMIN: 2.3 g/dL — AB (ref 3.5–5.0)
ALT: 8 U/L (ref 0–44)
ANION GAP: 28 — AB (ref 5–15)
AST: 13 U/L — AB (ref 15–41)
Alkaline Phosphatase: 165 U/L — ABNORMAL HIGH (ref 38–126)
BUN: 11 mg/dL (ref 6–20)
CO2: 18 mmol/L — AB (ref 22–32)
Calcium: 9.2 mg/dL (ref 8.9–10.3)
Chloride: 92 mmol/L — ABNORMAL LOW (ref 98–111)
Creatinine, Ser: 0.97 mg/dL (ref 0.61–1.24)
GFR calc Af Amer: >60 mL/min (ref >60)
GFR calc non Af Amer: >60 mL/min (ref >60)
GLUCOSE: 368 mg/dL — AB (ref 70–99)
POTASSIUM: 3.7 mmol/L (ref 3.5–5.1)
SODIUM: 138 mmol/L (ref 135–145)
Total Bilirubin: 2.3 mg/dL — ABNORMAL HIGH (ref 0.3–1.2)
Total Protein: 7.5 g/dL (ref 6.5–8.1)

## 2018-03-20 LAB — PROTIME-INR
INR: 1.18
PROTHROMBIN TIME: 14.9 s (ref 11.4–15.2)

## 2018-03-20 LAB — CK: Total CK: 19 U/L — ABNORMAL LOW (ref 49–397)

## 2018-03-20 LAB — SALICYLATE LEVEL: Salicylate Lvl: <7 mg/dL (ref 2.8–30.0)

## 2018-03-20 LAB — LACTIC ACID, PLASMA
LACTIC ACID, VENOUS: 2 mmol/L — AB (ref 0.5–1.9)
Lactic Acid, Venous: 1.4 mmol/L (ref 0.5–1.9)

## 2018-03-20 LAB — BASIC METABOLIC PANEL
ANION GAP: 26 — AB (ref 5–15)
BUN: 14 mg/dL (ref 6–20)
CHLORIDE: 97 mmol/L — AB (ref 98–111)
CO2: 17 mmol/L — AB (ref 22–32)
Calcium: 9.2 mg/dL (ref 8.9–10.3)
Creatinine, Ser: 0.97 mg/dL (ref 0.61–1.24)
GFR calc non Af Amer: >60 mL/min (ref >60)
Glucose, Bld: 367 mg/dL — ABNORMAL HIGH (ref 70–99)
Potassium: 3.8 mmol/L (ref 3.5–5.1)
Sodium: 140 mmol/L (ref 135–145)

## 2018-03-20 LAB — BLOOD GAS, VENOUS
PCO2 VEN: 40 mmHg — AB (ref 44.0–60.0)
PH VEN: 7.27 (ref 7.250–7.430)
PO2 VEN: 41 mmHg (ref 32.0–45.0)
Patient temperature: 37

## 2018-03-20 LAB — C-REACTIVE PROTEIN: CRP: 34.8 mg/dL — ABNORMAL HIGH (ref <1.0)

## 2018-03-20 LAB — MRSA PCR SCREENING: MRSA by PCR: NEGATIVE

## 2018-03-20 LAB — MAGNESIUM: MAGNESIUM: 1.8 mg/dL (ref 1.7–2.4)

## 2018-03-20 LAB — SEDIMENTATION RATE: Sed Rate: 116 mm/hr — ABNORMAL HIGH (ref 0–15)

## 2018-03-20 LAB — LIPASE, BLOOD: Lipase: 15 U/L (ref 11–51)

## 2018-03-20 MED ORDER — HYDROMORPHONE HCL 1 MG/ML IJ SOLN
1.0000 mg | INTRAMUSCULAR | Status: AC
  Administered 2018-03-20: 1 mg via INTRAVENOUS

## 2018-03-20 MED ORDER — SODIUM CHLORIDE 0.9 % IV BOLUS
1000.0000 mL | Freq: Once | INTRAVENOUS | Status: AC
  Administered 2018-03-20: 1000 mL via INTRAVENOUS

## 2018-03-20 MED ORDER — METRONIDAZOLE IN NACL 5-0.79 MG/ML-% IV SOLN
500.0000 mg | Freq: Once | INTRAVENOUS | Status: AC
  Administered 2018-03-20: 500 mg via INTRAVENOUS
  Filled 2018-03-20: qty 100

## 2018-03-20 MED ORDER — DEXTROSE-NACL 5-0.45 % IV SOLN: INTRAVENOUS | Status: DC

## 2018-03-20 MED ORDER — SODIUM CHLORIDE 0.9 % IV SOLN: 2.0000 g | Freq: Three times a day (TID) | INTRAVENOUS | Status: DC

## 2018-03-20 MED ORDER — GABAPENTIN 300 MG PO CAPS
300.0000 mg | ORAL_CAPSULE | Freq: Three times a day (TID) | ORAL | Status: DC
  Administered 2018-03-20 – 2018-04-17 (×81): 300 mg via ORAL
  Filled 2018-03-20 (×23): qty 1
  Filled 2018-03-20: qty 3
  Filled 2018-03-20 (×62): qty 1

## 2018-03-20 MED ORDER — SODIUM CHLORIDE 0.9 % IV BOLUS
500.0000 mL | Freq: Once | INTRAVENOUS | Status: AC
  Administered 2018-03-20: 500 mL via INTRAVENOUS

## 2018-03-20 MED ORDER — INSULIN ASPART 100 UNIT/ML ~~LOC~~ SOLN
4.0000 [IU] | Freq: Once | SUBCUTANEOUS | Status: AC
  Administered 2018-03-20: 4 [IU] via SUBCUTANEOUS
  Filled 2018-03-20: qty 1

## 2018-03-20 MED ORDER — SODIUM CHLORIDE 0.9 % IV SOLN
2.0000 g | Freq: Once | INTRAVENOUS | Status: AC
  Administered 2018-03-20: 2 g via INTRAVENOUS
  Filled 2018-03-20: qty 2

## 2018-03-20 MED ORDER — SODIUM CHLORIDE 0.9 % IV SOLN: INTRAVENOUS | Status: DC

## 2018-03-20 MED ORDER — HYDROMORPHONE HCL 1 MG/ML IJ SOLN
1.0000 mg | INTRAMUSCULAR | Status: AC
  Administered 2018-03-20: 1 mg via INTRAVENOUS
  Filled 2018-03-20: qty 1

## 2018-03-20 MED ORDER — NICOTINE 21 MG/24HR TD PT24
21.0000 mg | MEDICATED_PATCH | Freq: Every day | TRANSDERMAL | Status: DC
  Administered 2018-03-21 – 2018-04-01 (×2): 21 mg via TRANSDERMAL
  Filled 2018-03-20 (×26): qty 1

## 2018-03-20 MED ORDER — HYDROMORPHONE HCL 1 MG/ML IJ SOLN
1.0000 mg | INTRAMUSCULAR | Status: AC
  Administered 2018-03-20: 1 mg via INTRAMUSCULAR
  Filled 2018-03-20: qty 1

## 2018-03-20 MED ORDER — HYDROMORPHONE HCL 1 MG/ML IJ SOLN
2.0000 mg | INTRAMUSCULAR | Status: DC | PRN
  Administered 2018-03-20 – 2018-03-23 (×5): 2 mg via INTRAVENOUS
  Filled 2018-03-20 (×6): qty 2

## 2018-03-20 MED ORDER — HYDROMORPHONE HCL 1 MG/ML IJ SOLN
1.0000 mg | INTRAMUSCULAR | Status: DC
  Filled 2018-03-20: qty 1

## 2018-03-20 MED ORDER — DEXTROSE-NACL 5-0.45 % IV SOLN
INTRAVENOUS | Status: DC
  Administered 2018-03-20: 20:00:00 via INTRAVENOUS

## 2018-03-20 MED ORDER — HYDRALAZINE HCL 20 MG/ML IJ SOLN: 10.0000 mg | Freq: Four times a day (QID) | INTRAMUSCULAR | Status: DC | PRN

## 2018-03-20 MED ORDER — VANCOMYCIN HCL IN DEXTROSE 1-5 GM/200ML-% IV SOLN
1000.0000 mg | Freq: Once | INTRAVENOUS | Status: AC
  Administered 2018-03-20: 1000 mg via INTRAVENOUS
  Filled 2018-03-20: qty 200

## 2018-03-20 MED ORDER — LORAZEPAM 2 MG/ML IJ SOLN: 1.0000 mg | Freq: Once | INTRAMUSCULAR | Status: DC

## 2018-03-20 MED ORDER — SODIUM CHLORIDE 0.9 % IV SOLN
INTRAVENOUS | Status: DC
  Administered 2018-03-20: 2.8 [IU]/h via INTRAVENOUS
  Filled 2018-03-20: qty 1

## 2018-03-20 MED ORDER — OXYCODONE-ACETAMINOPHEN 5-325 MG PO TABS
1.0000 | ORAL_TABLET | ORAL | Status: DC | PRN
  Administered 2018-03-20 – 2018-03-23 (×3): 1 via ORAL
  Filled 2018-03-20 (×4): qty 1

## 2018-03-20 MED ORDER — DEXTROSE 50 % IV SOLN: 25.0000 mL | INTRAVENOUS | Status: DC | PRN

## 2018-03-20 MED ORDER — SODIUM CHLORIDE 0.9 % IV SOLN
INTRAVENOUS | Status: DC
  Administered 2018-03-20: 17:00:00 via INTRAVENOUS

## 2018-03-20 MED ORDER — POTASSIUM CHLORIDE 10 MEQ/100ML IV SOLN: 10.0000 meq | INTRAVENOUS | Status: AC

## 2018-03-20 MED ORDER — HYDROMORPHONE HCL 1 MG/ML IJ SOLN
0.5000 mg | INTRAMUSCULAR | Status: AC
  Administered 2018-03-20: 0.5 mg via INTRAVENOUS
  Filled 2018-03-20: qty 1

## 2018-03-20 MED ORDER — VANCOMYCIN HCL IN DEXTROSE 1-5 GM/200ML-% IV SOLN: 1000.0000 mg | Freq: Three times a day (TID) | INTRAVENOUS | Status: DC

## 2018-03-20 MED ORDER — HYDROMORPHONE HCL 1 MG/ML IJ SOLN
1.0000 mg | INTRAMUSCULAR | Status: DC | PRN
  Administered 2018-03-20: 1 mg via INTRAVENOUS
  Filled 2018-03-20: qty 1

## 2018-03-20 MED ORDER — INSULIN REGULAR BOLUS VIA INFUSION
0.0000 [IU] | Freq: Three times a day (TID) | INTRAVENOUS | Status: DC
  Filled 2018-03-20: qty 10

## 2018-03-20 MED ORDER — SODIUM CHLORIDE 0.9 % IV SOLN
INTRAVENOUS | Status: DC
  Filled 2018-03-20: qty 1

## 2018-03-20 NOTE — ED Provider Notes (Signed)
Awaiting discussion with neurosurgery to advise on antibiotic initiation.    Tony CreamerQuale, Tony Mccowan, MD 03/20/18 1019

## 2018-03-20 NOTE — Progress Notes (Signed)
Pt. Came to 5w from [facility] at 3pm.  VS on flowsheet.  Pt. Is ST, BP is elevated.  Complaining of pain and restless.  Put page out MD.

## 2018-03-20 NOTE — ED Triage Notes (Signed)
Pt states "I can' walk, my back hurts so bad". Pt states over one month of back pain. Pt states now pain is moving up to neck. Pt with 3 large skin colored swollen areas noted to mid spine. Pt denies fever or rash. No nuchal rigidity noted.

## 2018-03-20 NOTE — Progress Notes (Signed)
CODE SEPSIS - PHARMACY COMMUNICATION  **Broad Spectrum Antibiotics should be administered within 1 hour of Sepsis diagnosis**  Time Code Sepsis Called/Page Received: 1000  Antibiotics Ordered: vanc/cefepime  Time of 1st antibiotic administration: 1103  Additional action taken by pharmacy:   If necessary, Name of Provider/Nurse Contacted:     Thomasene Rippleavid  Faizah Kandler ,PharmD Clinical Pharmacist  03/20/2018  11:05 AM

## 2018-03-20 NOTE — Progress Notes (Signed)
Spoke to [employer] wal[employer]..... Transport sent to get Patient at 900 pm pulled out IV and ripped off LEADS; unable to obtain images with patient in this state; IV team needs to be called

## 2018-03-20 NOTE — Progress Notes (Signed)
Pharmacy Antibiotic Note  Tony Long is a 42 y.o. male admitted on 03/20/2018 with sepsis s/t spinal myositis.  Pharmacy has been consulted for vanc/cefepime dosing. Patient received vanc 1g, cefepime 2g, and flagyl 500 mg IV x 1 in ED  Plan: Will continue vanc 1g IV q8h w/ 6 hour stack  Will draw vanc trough 09/22 @ 1600 prior to 4th dose. Will continue cefepime 2g IV q8h  Ke 0.0840 T1/2 8 hrs Goal trough 15 - 20 mcg/mL  Height: 5\' 11"  (180.3 cm) Weight: 150 lb (68 kg) IBW/kg (Calculated) : 75.3  Temp (24hrs), Avg:97.5 F (36.4 C), Min:97.5 F (36.4 C), Max:97.5 F (36.4 C)  Recent Labs  Lab 03/20/18 0759 03/20/18 1235  WBC 22.2*  --   CREATININE 0.97 0.97  LATICACIDVEN 2.0*  --     Estimated Creatinine Clearance: 96.4 mL/min (by C-G formula based on SCr of 0.97 mg/dL).    No Known Allergies  Thank you for allowing pharmacy to be a part of this patient's care.  Thomasene Rippleavid Damien Batty, PharmD, BCPS Clinical Pharmacist 03/20/2018

## 2018-03-20 NOTE — Consult Note (Signed)
Reason for Consult:cellulitis and subcutaneous infection in IVDA Referring Physician: Corben Auzenne is an 42 y.o. male.  HPI: H/o insulin dependent DM2, HTN, h/o IVDU, reports has low back pain for the last 4 weeks, he was seen in the ED a week ago was sent home with pain meds and muscle relaxer, pain got worse, he started to use his son's subutex for the last week, pain continue to get worse and he starts to feel week in both his legs, he denies fever. no bowel and bladder incontinence, no saddle anesthesia,  He also reports neck pain. he returned to the [facility] ED today. Transferred to [facility] for further care.  Past Medical History:  Diagnosis Date  . Diabetes mellitus without complication (HCC)     No past surgical history on file.  Family History  Problem Relation Age of Onset  . Heart disease Father   . Diabetes Father   . Diabetes Paternal Uncle     Social History:  reports that he has been smoking. He has been smoking about 1.00 pack per day. He has never used smokeless tobacco. He reports that he drank alcohol. He reports that he does not use drugs.  Allergies: No Known Allergies  Medications: I have reviewed the patient's current medications.  Results for orders placed or performed during the hospital encounter of 03/20/18 (from the past 48 hour(s))  Glucose, capillary     Status: Abnormal   Collection Time: 03/20/18  3:41 PM  Result Value Ref Range   Glucose-Capillary 346 (H) 70 - 99 mg/dL  MRSA PCR Screening     Status: None   Collection Time: 03/20/18  3:50 PM  Result Value Ref Range   MRSA by PCR NEGATIVE NEGATIVE    Comment:        The GeneXpert MRSA Assay (FDA approved for NASAL specimens only), is one component of a comprehensive MRSA colonization surveillance program. It is not intended to diagnose MRSA infection nor to guide or monitor treatment for MRSA infections. Performed at [facility], [address], [place], [place] [address]     Urine rapid drug screen (hosp performed)     Status: Abnormal   Collection Time: 03/20/18  4:22 PM  Result Value Ref Range   Opiates POSITIVE (A) NONE DETECTED   Cocaine POSITIVE (A) NONE DETECTED   Benzodiazepines NONE DETECTED NONE DETECTED   Amphetamines NONE DETECTED NONE DETECTED   Tetrahydrocannabinol NONE DETECTED NONE DETECTED   Barbiturates NONE DETECTED NONE DETECTED    Comment: (NOTE) DRUG SCREEN FOR MEDICAL PURPOSES ONLY.  IF CONFIRMATION IS NEEDED FOR ANY PURPOSE, NOTIFY LAB WITHIN 5 DAYS. LOWEST DETECTABLE LIMITS FOR URINE DRUG SCREEN Drug Class                     Cutoff (ng/mL) Amphetamine and metabolites    1000 Barbiturate and metabolites    200 Benzodiazepine                 200 Tricyclics and metabolites     300 Opiates and metabolites        300 Cocaine and metabolites        300 THC                            50 Performed at [facility], [address], [place], [place] [address]   Urinalysis, Routine w reflex microscopic  Status: Abnormal   Collection Time: 03/20/18  4:22 PM  Result Value Ref Range   Color, Urine YELLOW (A) YELLOW   APPearance CLEAR (A) CLEAR   Specific Gravity, Urine 1.015 1.005 - 1.030   pH 5.0 5.0 - 8.0   Glucose, UA >500 (A) NEGATIVE mg/dL   Hgb urine dipstick SMALL (A) NEGATIVE   Bilirubin Urine NEGATIVE NEGATIVE   Ketones, ur 80 (A) NEGATIVE mg/dL   Protein, ur NEGATIVE NEGATIVE mg/dL   Nitrite NEGATIVE NEGATIVE   Leukocytes, UA NEGATIVE NEGATIVE   RBC / HPF 0-5 0 - 5 RBC/hpf   WBC, UA 0-5 0 - 5 WBC/hpf   Bacteria, UA NONE SEEN NONE SEEN   Mucus PRESENT     Comment: Performed at [facility], [address] [place], [place] [address]  Glucose, capillary     Status: Abnormal   Collection Time: 03/20/18  4:48 PM  Result Value Ref Range   Glucose-Capillary 294 (H) 70 - 99 mg/dL    Ct Thoracic Spine Wo Contrast  Result Date: 03/20/2018 CLINICAL DATA:  Progressive back pain.  Swelling of the lower back.  EXAM: CT THORACIC SPINE WITHOUT CONTRAST TECHNIQUE: Multidetector CT images of the thoracic were obtained using the standard protocol without intravenous contrast. COMPARISON:  None. FINDINGS: Alignment: Normal. 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 Paraspinal and other soft tissues: There is abnormal gas and fluid in the soft tissues of the right side of the base of the neck and in the right supraclavicular region. Paraspinal soft tissues appear normal throughout the thoracic spine. Disc levels: There is no evidence of disc protrusion or significant disc bulging or spinal or foraminal stenosis or other significant abnormality of the thoracic spine. IMPRESSION: 1. Evidence of cellulitis involving the right side of the base of the neck extending into the right supraclavicular region with fluid and gas in the soft tissues at the base of the right side of the neck. 2. No significant abnormality of the thoracic spine. 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 Electronically Signed   By: Francene Boyers M.D.   On: 03/20/2018 11:25   Ct Lumbar Spine Wo Contrast  Addendum Date: 03/20/2018   ADDENDUM REPORT: 03/20/2018 11:44 ADDENDUM: Critical Value/emergent results were called by telephone at the time of interpretation on 03/20/2018 at 11:30 am to Dr. Sharyn Creamer , who verbally acknowledged these results. Electronically Signed   By: Francene Boyers M.D.   On: 03/20/2018 11:44   Result Date: 03/20/2018 CLINICAL DATA:  Increasing low back pain and soft tissue swelling. EXAM: CT LUMBAR SPINE WITHOUT CONTRAST TECHNIQUE: Multidetector CT imaging of the lumbar spine was performed without intravenous contrast administration. Multiplanar CT image reconstructions were also generated. IV contrast could not be utilized due to the lack of an appropriate IV. COMPARISON:  None. FINDINGS: Segmentation: 5 lumbar type vertebrae. Alignment: Normal. Vertebrae: There is a moth-eaten appearance of the spinous processes of L3 and L4  which is worrisome for osteomyelitis. Bilateral pars defects at L5 with grade 1 spondylolisthesis. 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 Paraspinal and other soft tissues: There is an extensive abnormal fluid collection in the subcutaneous soft tissues of the posterior aspect of the back extending from approximately L1-2 to S3. This fluid collection is lobulated and measures approximately 20 x 9 x 2.5 cm. It is centered slightly to the left of midline and has a mass effect upon the adjacent posterior paraspinal muscles. There is abnormal lucency in the underlying paraspinal muscles which could represent  myositis. Disc levels: T11-12: No significant abnormality. Butterfly T12 vertebra. T12-L1: No significant abnormality. L1-2: Normal disc. Abnormal edema in the posterior paraspinal musculature with adjacent fluid collection in the subcutaneous fat of the posterior aspect of the back as described above. L2-3: Normal disc. L3-4: Normal disc. Lucency in the posterior paraspinal soft tissues extends to the posterior aspect of the thecal sac on image 80 of series 4 but there is no discrete epidural abscess. L4-5: Normal disc.  No evidence of epidural abscess. L5-S1: Grade 1 spondylolisthesis. No disc bulging or protrusion. Bilateral pars defects. No visible epidural abscess. IMPRESSION: 1. Extensive abnormal fluid collection in the subcutaneous fat of the midline of the back with underlying marked abnormality of the posterior paraspinal musculature from L1-2 through S3. This is worrisome for subcutaneous abscess and myositis. 2. Moth-eaten appearance of the spinous processes of L3 and L4 consistent with osteomyelitis. 3. No discrete epidural abscess. However, the abnormal edema in the paraspinal musculature extends to the posterior aspect of the spinal canal at L3-4. 4. MRI with and without contrast may better define the extent of the soft tissue and infection and could detect epidural extension that is not apparent on  this unenhanced CT scan. Electronically Signed: By: Francene BoyersJames  Maxwell M.D. On: 03/20/2018 11:18    Review of Systems - Negative except As per HPI    Blood pressure (!) 168/95, pulse (!) 119, temperature 98 F (36.7 C), temperature source Oral, resp. rate 17, SpO2 99 %. Physical Exam  Constitutional: He is oriented to person, place, and time. He appears well-developed. He appears cachectic. He has a sickly appearance. He appears ill.  Neurological: He is alert and oriented to person, place, and time. He has normal strength and normal reflexes. No cranial nerve deficit or sensory deficit. GCS eye subscore is 4. GCS verbal subscore is 5. GCS motor subscore is 6.  Full strength both upper and lower extremities. No numbness in arms and legs.  [name] unable to lay flat due to tender, fluctuant subcutaneous fluid collections, c/w abscess.  Skin:       Assessment/Plan: Patient has fluctuant, tender subcutaneous abscesses over left low back and base of neck.  These are exquisitely tender.  Patient has normal neurologic exam.  My suspicion of ESA is relatively low.  He is unable to lay flat because of these subcutaneous collections.  I recommend IR (or General Surgery) drain these collections, which will provide a diagnosis of organism and go a long way towards relieving his pain.  After these superficial abscesses are drained, MRI can be obtained of spine to make sure there are no epidural spinal abscesses.  But, in light of his normal neurologic exam, my degree of suspicion of ESA is relatively low.  Treat with broad antibiotics and draining these collections as soon as possible may still yield a diagnosis of infectious cause.  ?HIV status.  Dorian HeckleSTERN,Zyhir Cappella D, MD 03/20/2018, 6:19 PM

## 2018-03-20 NOTE — ED Notes (Signed)
approx 45 min looking for IV site. Dr. Fanny BienQuale notified and bedside attempting with ultrasound.

## 2018-03-20 NOTE — ED Notes (Signed)
Pt non compliant with keeping monitor on.reminded multiple times about not removing bp cuff and ECG  Leads, Pt curses at staff stating he will take them off if he wants to.

## 2018-03-20 NOTE — ED Notes (Signed)
Report to [place], [place]

## 2018-03-20 NOTE — Progress Notes (Signed)
Pt unhooked himself from insulin gtt and pulled out the EKG leads . This nurse attempted to restart insulin gtt but pt is adamant. Attempts to advice pt against disrupting medical therapies remains unsuccessful. Provider aware

## 2018-03-20 NOTE — Progress Notes (Signed)
Mr. Tony Long is a 42 year old male with extensive history of diabetes mellitus, chronic back pain, IV drug user using Subutex without his Narcan, presented to ED with chief complaint of back pain.   Found to be septic, with superimposed mild possible DKA, with CT findings of fluid collection, inflammation from L1-2 through S3, possible abscess versus myositis, L3/L4 osteomyelitis.  ED work-up revealed:  Temp 97.7, heart rate 114, respiratory rate 16, blood pressure 150/97 satting 100% on room air Labs: WBC of 22.2, lactic acid of 2.2, serum glucose of 368, anion gap 28  Neurosurgery Dr. Venetia Long was consulted he [facility] Please [facility].  PCCM Dr. Hyacinth Long was consulted for possible patient to go to ICU.  Quested patient to go to stepdown as patient is stable now.  Treatment patient has been initiated on IV fluids, blood cultures have been obtained. Patient has been started on IV antibiotics of vancomycin and cefepime.  [facility].

## 2018-03-20 NOTE — H&P (Addendum)
History and Physical  Tony Long ZOX:[id number] DOB: 16-May-1976 DOA: 03/20/2018  Referring physician: EDP PCP: [name], FNP   Chief Complaint: neck pain and low back pain  HPI: Tony Long is a 42 y.o. male   H/o insulin dependent DM2, HTN, h/o IVDU, reports has low back pain for the last 4 weeks, he was seen in the ED a week ago was sent home with pain meds and muscle relaxer, pain got worse, he started to use his son's subutex for the last week, pain continue to get worse and he starts to feel week in both his legs, he denies fever. no bowel and bladder incontinence, no saddle anesthesia,  He also reports neck pain. he returned to the [name] ED today.    ED course: Temp 97.7, heart rate 114, respiratory rate 16, blood pressure 150/97 satting 100% on room air Labs: WBC of 22.2, lactic acid of 2.2, serum glucose initially above 600,  anion gap 28  CT thoracic spine: IMPRESSION: 1. Evidence of cellulitis involving the right side of the base of the neck extending into the right supraclavicular region with fluid and gas in the soft tissues at the base of the right side of the neck. 2. No significant abnormality of the thoracic spine. 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  CT lumber spine: IMPRESSION: 1. Extensive abnormal fluid collection in the subcutaneous fat of the midline of the back with underlying marked abnormality of the posterior paraspinal musculature from L1-2 through S3. This is worrisome for subcutaneous abscess and myositis. 2. Moth-eaten appearance of the spinous processes of L3 and L4 consistent with osteomyelitis. 3. No discrete epidural abscess. However, the abnormal edema in the paraspinal musculature extends to the posterior aspect of the spinal canal at L3-4. 4. MRI with and without contrast may better define the extent of the soft tissue and infection and could detect epidural extension that is not apparent on this unenhanced CT  scan.  EDP discussed case with Dr Venetia Maxon who recommended transfer patient to [place].  Per RN reports :[name] started on ivf, insulin drip due to concerning for DKA, he received iv dilaudid , he is given cefepime 2g x1, flagyl and vanc, I have reviewed record , I only see cefepime though.  [name] transferred to [facility] cone stepdown unit.   Review of Systems:  Detail per HPI, Review of systems are otherwise negative  Past Medical History:  Diagnosis Date  . Diabetes mellitus without complication (HCC)    No past surgical history on file. Social History:  reports that he has been smoking. He has been smoking about 1.00 pack per day. He has never used smokeless tobacco. He reports that he drank alcohol. He reports that he does not use drugs. Patient lives at home & is able to participate in activities of daily living independently at baseline  No Known Allergies  Family History  Problem Relation Age of Onset  . Heart disease Father   . Diabetes Father   . Diabetes Paternal Uncle       Prior to Admission medications   Medication Sig Start Date End Date Taking? Authorizing Provider  gabapentin (NEURONTIN) 300 MG capsule Take 1 capsule (300 mg total) by mouth 3 (three) times daily. 02/26/18  Yes [name], FNP  Insulin Glargine (LANTUS) 100 UNIT/ML Solostar Pen Inject 50 Units into the skin daily at 10 pm. Patient taking differently: Inject 40 Units into the skin at bedtime.  02/26/18  Yes Raliegh Ip  M, FNP  insulin lispro (HUMALOG) 100 UNIT/ML injection Inject 0.1 mLs (10 Units total) into the skin 3 (three) times daily with meals. 03/04/18  Yes [name], FNP  cyclobenzaprine (FLEXERIL) 5 MG tablet Take 1-2 tablets (5-10 mg total) by mouth 3 (three) times daily as needed for muscle spasms. Patient not taking: Reported on 03/20/2018 03/07/18   Evon Slack, PA-C  etodolac (LODINE) 500 MG tablet Take 1 tablet (500 mg total) by mouth 2 (two) times  daily. Patient not taking: Reported on 03/20/2018 03/07/18   Amador Cunas C, PA-C  glucose blood test strip Use as instructed 03/13/16   Henrietta Hoover, NP  HYDROcodone-acetaminophen (NORCO) 5-325 MG tablet Take 1 tablet by mouth every 6 (six) hours as needed for moderate pain. Patient not taking: Reported on 03/20/2018 03/07/18   Evon Slack, PA-C  ibuprofen (ADVIL,MOTRIN) 600 MG tablet Take 1 tablet (600 mg total) by mouth every 8 (eight) hours as needed. Patient not taking: Reported on 02/26/2018 10/14/17   [name], FNP  Lancets [facility] ULTRASOFT) lancets Use as instructed 03/13/16   Henrietta Hoover, NP  LANTUS 100 UNIT/ML injection INJECT 40 UNITS TOTAL INTO THE SKIN AT BEDTIME. Patient not taking: Reported on 03/20/2018 03/04/18   [name], FNP  lisinopril (PRINIVIL,ZESTRIL) 5 MG tablet Take 1 tablet (5 mg total) by mouth daily. Patient not taking: Reported on 02/26/2018 10/14/17   [name], FNP  metFORMIN (GLUCOPHAGE) 500 MG tablet Take 1 tablet (500 mg total) by mouth 2 (two) times daily with a meal. Patient not taking: Reported on 10/14/2017 12/11/16   Bing Neighbors, FNP  pravastatin (PRAVACHOL) 40 MG tablet Take 1 tablet (40 mg total) by mouth daily. Patient not taking: Reported on 12/11/2016 06/02/16   Henrietta Hoover, NP  sildenafil (VIAGRA) 25 MG tablet Take 1 tablet (25 mg total) by mouth daily as needed for erectile dysfunction. Patient not taking: Reported on 03/20/2018 02/26/18   [name], FNP    Physical Exam: BP (!) 168/95   Pulse (!) 119   Temp 98 F (36.7 C) (Oral)   Resp 17   SpO2 99%   General:   In pain, agitated Eyes: PERRL ENT: unremarkable Neck: supple, no JVD Cardiovascular: sinus tachycardia  Respiratory: CTABL Abdomen: soft/ND/ND, positive bowel sounds Skin: superficial cellulitis neck and back Musculoskeletal:  No edema Psychiatric: calm/cooperative Neurologic: no focal findings  , moving all extremities,  sensation intact          Labs on Admission:  Basic Metabolic Panel: Recent Labs  Lab 03/20/18 0759 03/20/18 1235  NA 138 140  K 3.7 3.8  CL 92* 97*  CO2 18* 17*  GLUCOSE 368* 367*  BUN 11 14  CREATININE 0.97 0.97  CALCIUM 9.2 9.2  MG 1.8  --    Liver Function Tests: Recent Labs  Lab 03/20/18 0759  AST 13*  ALT 8  ALKPHOS 165*  BILITOT 2.3*  PROT 7.5  ALBUMIN 2.3*   Recent Labs  Lab 03/20/18 0759  LIPASE 15   No results for input(s): AMMONIA in the last 168 hours. CBC: Recent Labs  Lab 03/20/18 0759  WBC 22.2*  NEUTROABS 19.7*  HGB 10.5*  HCT 32.1*  MCV 95.6  PLT 388   Cardiac Enzymes: Recent Labs  Lab 03/20/18 0759  CKTOTAL 19*    BNP (last 3 results) No results for input(s): BNP in the last 8760 hours.  ProBNP (last 3 results) No  results for input(s): PROBNP in the last 8760 hours.  CBG: Recent Labs  Lab 03/20/18 0918 03/20/18 1246 03/20/18 1414  GLUCAP 358* 343* 352*    Radiological Exams on Admission: Ct Thoracic Spine Wo Contrast  Result Date: 03/20/2018 CLINICAL DATA:  Progressive back pain.  Swelling of the lower back. EXAM: CT THORACIC SPINE WITHOUT CONTRAST TECHNIQUE: Multidetector CT images of the thoracic were obtained using the standard protocol without intravenous contrast. COMPARISON:  None. FINDINGS: Alignment: Normal. 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 Paraspinal and other soft tissues: There is abnormal gas and fluid in the soft tissues of the right side of the base of the neck and in the right supraclavicular region. Paraspinal soft tissues appear normal throughout the thoracic spine. Disc levels: There is no evidence of disc protrusion or significant disc bulging or spinal or foraminal stenosis or other significant abnormality of the thoracic spine. IMPRESSION: 1. Evidence of cellulitis involving the right side of the base of the neck extending into the right supraclavicular region with fluid and gas in the soft  tissues at the base of the right side of the neck. 2. No significant abnormality of the thoracic spine. 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 Electronically Signed   By: Francene Boyers M.D.   On: 03/20/2018 11:25   Ct Lumbar Spine Wo Contrast  Addendum Date: 03/20/2018   ADDENDUM REPORT: 03/20/2018 11:44 ADDENDUM: Critical Value/emergent results were called by telephone at the time of interpretation on 03/20/2018 at 11:30 am to Dr. Sharyn Creamer , who verbally acknowledged these results. Electronically Signed   By: Francene Boyers M.D.   On: 03/20/2018 11:44   Result Date: 03/20/2018 CLINICAL DATA:  Increasing low back pain and soft tissue swelling. EXAM: CT LUMBAR SPINE WITHOUT CONTRAST TECHNIQUE: Multidetector CT imaging of the lumbar spine was performed without intravenous contrast administration. Multiplanar CT image reconstructions were also generated. IV contrast could not be utilized due to the lack of an appropriate IV. COMPARISON:  None. FINDINGS: Segmentation: 5 lumbar type vertebrae. Alignment: Normal. Vertebrae: There is a moth-eaten appearance of the spinous processes of L3 and L4 which is worrisome for osteomyelitis. Bilateral pars defects at L5 with grade 1 spondylolisthesis. 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 Paraspinal and other soft tissues: There is an extensive abnormal fluid collection in the subcutaneous soft tissues of the posterior aspect of the back extending from approximately L1-2 to S3. This fluid collection is lobulated and measures approximately 20 x 9 x 2.5 cm. It is centered slightly to the left of midline and has a mass effect upon the adjacent posterior paraspinal muscles. There is abnormal lucency in the underlying paraspinal muscles which could represent myositis. Disc levels: T11-12: No significant abnormality. Butterfly T12 vertebra. T12-L1: No significant abnormality. L1-2: Normal disc. Abnormal edema in the posterior paraspinal musculature with adjacent fluid collection in  the subcutaneous fat of the posterior aspect of the back as described above. L2-3: Normal disc. L3-4: Normal disc. Lucency in the posterior paraspinal soft tissues extends to the posterior aspect of the thecal sac on image 80 of series 4 but there is no discrete epidural abscess. L4-5: Normal disc.  No evidence of epidural abscess. L5-S1: Grade 1 spondylolisthesis. No disc bulging or protrusion. Bilateral pars defects. No visible epidural abscess. IMPRESSION: 1. Extensive abnormal fluid collection in the subcutaneous fat of the midline of the back with underlying marked abnormality of the posterior paraspinal musculature from L1-2 through S3. This is worrisome for subcutaneous abscess and myositis. 2. Moth-eaten appearance  of the spinous processes of L3 and L4 consistent with osteomyelitis. 3. No discrete epidural abscess. However, the abnormal edema in the paraspinal musculature extends to the posterior aspect of the spinal canal at L3-4. 4. MRI with and without contrast may better define the extent of the soft tissue and infection and could detect epidural extension that is not apparent on this unenhanced CT scan. Electronically Signed: By: Francene BoyersJames  Maxwell M.D. On: 03/20/2018 11:18    EKG: Independently reviewed. Sinus tachycardia  Assessment/Plan Present on Admission: **None**  Low back pain /neck pain -subQ abscess, myositis,? Osteomyelitis, ? Epidural involvement -case discussed with neurosurgery Dr Venetia MaxonStern who recommended can have IR drain superficial abscess and sent for culture, currently does not have any neurologic signs to suggest cord involvement, can proceed with MRI once able to lay flat ( per Dr Venetia MaxonStern hopefull pain will get much better after subQ abscess drain, Dr Venetia MaxonStern input appreciated -patient may still need MRI under anesthesia if still not able to lay flat after abscess drained. MRI under anesthesia tentatively scheduled on Monday. -IR consulted ordered for abscess aspiration, keep npo  aftermidnight -blood culture in process -case discussed with infectious disease Dr Luciana Axeomer who recommend holding off further abx, start abx after abscess culture obtained, will likely need to call ID for formal consult once culture resulted.   H/o IVDU Chronic pain pmp aware record obtained and reviewed, he has total of 5 days norco prescription listed in 2019, no other controlled substance listed He reports has been on buprenorphin for the last week due to severe pain ( he reports he got subutex from his son) He states dilaudid 1mg  is not helping, will increase to 2mg , reacess HIV/hepatitis screening test ordered   Insulin  Dependent DM2 Recent a1c 12.6   DKA , he is continued on insulin drip/ivf dka order set utilized    HTN:  lisinopril on home meds list, but report not taking Avoid betablocker , + cocaine  Start prn hydralazine for now  Polysubstance use: He denies use uds obtained after receiving dilaudid  In the ED + opiates, + cocaine  Cigarette smoking, smoking cessation education provided, he declined nicotine patch for now   DVT prophylaxis: scd for now    Consultants:  Neurosurgery Dr Venetia MaxonStern consulted Case discussed with ID Dr Luciana Axeomer  Code Status: full   Family Communication:  Patient   Disposition Plan: admit to stepdown  Time spent: 75mins  Albertine GratesFang Yocelyn Brocious MD, PhD [facility] Pager [phone] If 7PM-7AM, please contact night-coverage at [email or web address], password [email or web address]

## 2018-03-20 NOTE — Progress Notes (Deleted)
Pt unhooked himself from insulin gtt an dripped the EKG leads off. This nurse attempted to restart insulin gtt but pt is adamant. Attempts to advice pt against disrupting medical therapies remains unsuccessful. Provider aware

## 2018-03-20 NOTE — Progress Notes (Signed)
Pt pulled out PIV to Right external Jugular and ripped off EKG leads. Pt seemed agitated and said he didn't know why he pulled the PIV and  EKG leads . Insulin gtt stopped @ 2103. New PIV inserted by IV team and insulin gtt restarted per glucose stabilizer. Pt reminded against disrupting medication therapies

## 2018-03-20 NOTE — ED Provider Notes (Signed)
[facility] Emergency Department Provider Note   ____________________________________________   First MD Initiated Contact with Patient 03/20/18 0740     (approximate)  I have reviewed the triage vital signs and the nursing notes.   HISTORY  Chief Complaint Back pain   HPI Tony Long is a 42 y.o. male expensing back pain for over a months time, but is noticed the last couple weeks that is severely worsening.  He is having lower back pain in an area that swollen over his lower back.  Associated with feeling of weakness in both his lower legs.  Denies chest pain.  No neck pain.  No headaches.  No fevers.  He is however an IV drug user, continues to inject Subutex regularly but reports he has almost no veins left.  Pain is described as 10 out of 10, severe.  Cannot lay on his back.  Is noticed swollen area over his back as well.  Able to walk but reports his legs feel weak in both lower legs for at least a month's time.   Past Medical History:  Diagnosis Date  . Diabetes mellitus without complication [name])     Patient Active Problem List   Diagnosis Date Noted  . Chronic hepatitis C without hepatic coma (HCC) 07/02/2016  . Diabetes (HCC) 03/14/2016  . Non compliance w medication regimen 03/14/2016    No past surgical history on file.  Prior to Admission medications   Medication Sig Start Date End Date Taking? Authorizing Provider  cyclobenzaprine (FLEXERIL) 5 MG tablet Take 1-2 tablets (5-10 mg total) by mouth 3 (three) times daily as needed for muscle spasms. 03/07/18   Evon Slack, PA-C  etodolac (LODINE) 500 MG tablet Take 1 tablet (500 mg total) by mouth 2 (two) times daily. 03/07/18   Evon Slack, PA-C  gabapentin (NEURONTIN) 300 MG capsule Take 1 capsule (300 mg total) by mouth 3 (three) times daily. 02/26/18   [name], FNP  glucose blood test strip Use as instructed 03/13/16   Henrietta Hoover, NP    HYDROcodone-acetaminophen (NORCO) 5-325 MG tablet Take 1 tablet by mouth every 6 (six) hours as needed for moderate pain. 03/07/18   Evon Slack, PA-C  ibuprofen (ADVIL,MOTRIN) 600 MG tablet Take 1 tablet (600 mg total) by mouth every 8 (eight) hours as needed. Patient not taking: Reported on 02/26/2018 10/14/17   [name], FNP  Insulin Glargine (LANTUS) 100 UNIT/ML Solostar Pen Inject 50 Units into the skin daily at 10 pm. 02/26/18   [name], FNP  insulin lispro (HUMALOG) 100 UNIT/ML injection Inject 0.1 mLs (10 Units total) into the skin 3 (three) times daily with meals. 03/04/18   [name], FNP  Lancets [facility] ULTRASOFT) lancets Use as instructed 03/13/16   Henrietta Hoover, NP  LANTUS 100 UNIT/ML injection INJECT 40 UNITS TOTAL INTO THE SKIN AT BEDTIME. 03/04/18   [name], FNP  lisinopril (PRINIVIL,ZESTRIL) 5 MG tablet Take 1 tablet (5 mg total) by mouth daily. Patient not taking: Reported on 02/26/2018 10/14/17   [name], FNP  metFORMIN (GLUCOPHAGE) 500 MG tablet Take 1 tablet (500 mg total) by mouth 2 (two) times daily with a meal. Patient not taking: Reported on 10/14/2017 12/11/16   Bing Neighbors, FNP  pravastatin (PRAVACHOL) 40 MG tablet Take 1 tablet (40 mg total) by mouth daily. Patient not taking: Reported on 12/11/2016 06/02/16   Henrietta Hoover, NP  sildenafil (VIAGRA)  25 MG tablet Take 1 tablet (25 mg total) by mouth daily as needed for erectile dysfunction. 02/26/18   Kallie LocksStroud, Natalie M, FNP    Allergies Patient has no known allergies.  Family History  Problem Relation Age of Onset  . Heart disease Father   . Diabetes Father   . Diabetes Paternal Uncle     Social History Social History   Tobacco Use  . Smoking status: Current Every Day Smoker    Packs/day: 1.00  . Smokeless tobacco: Never Used  Substance Use Topics  . Alcohol use: Not Currently    Comment: seldom  . Drug use: No    Comment: pt mother said hx of use     Review of Systems Constitutional: No fever/chills Eyes: No visual changes. ENT: No sore throat. Cardiovascular: Denies chest pain. Respiratory: Denies shortness of breath. Gastrointestinal: No abdominal pain.  No nausea, no vomiting.  No diarrhea.  No constipation. Genitourinary: Negative for dysuria. Musculoskeletal: Severe lower back pain skin: Negative for rash for swollen areas over his lower back. Neurological: Negative for headaches, focal weakness or numbness except his legs feel little weaker over the last month.    ____________________________________________   PHYSICAL EXAM:  VITAL SIGNS: ED Triage Vitals [03/20/18 0606]  Enc Vitals Group     BP (!) 146/85     Pulse Rate (!) 110     Resp 20     Temp (!) 97.5 F (36.4 C)     Temp Source Oral     SpO2 100 %     Weight 150 lb (68 kg)     Height 5\' 11"  (1.803 m)     Head Circumference      Peak Flow      Pain Score 10     Pain Loc      Pain Edu?      Excl. in GC?     Constitutional: Alert and oriented.  Appears in pain. Eyes: Conjunctivae are normal. Head: Atraumatic. Nose: No congestion/rhinnorhea. Mouth/Throat: Mucous membranes are moist. Neck: No stridor.   Cardiovascular: Tachycardic rate, regular rhythm. Grossly normal heart sounds.  Good peripheral circulation. Respiratory: Normal respiratory effort.  No retractions. Lungs CTAB. Gastrointestinal: Soft and nontender. No distention. Musculoskeletal: No lower extremity tenderness nor edema.  He does have some slight what appears to be atrophy of the lower leg muscles bilaterally.  He demonstrates 5 out of 5 strength in both lower extremities.  Denies any sensory deficit to examination of the lower extremities bilateral. Neurologic:  Normal speech and language. No gross focal neurologic deficits are appreciated.  Skin:  Skin is warm, dry and intact however over his mid lumbar spine there are is a discrete region about the size of the hand that is swollen  overlying the midline lumbar spine without overlying erythema.  It is extremely tender to palpation, and swollen about the size of a 1 cm indurated. Psychiatric: Mood and affect are slightly elevated, anxious patient reports he is in pain. Speech and behavior are normal.  ____________________________________________   LABS (all labs ordered are listed, but only abnormal results are displayed)  Labs Reviewed  LACTIC ACID, PLASMA - Abnormal; Notable for the following components:      Result Value   Lactic Acid, Venous 2.0 (*)    All other components within normal limits  COMPREHENSIVE METABOLIC PANEL - Abnormal; Notable for the following components:   Chloride 92 (*)    CO2 18 (*)    Glucose, Bld 368 (*)  Albumin 2.3 (*)    AST 13 (*)    Alkaline Phosphatase 165 (*)    Total Bilirubin 2.3 (*)    Anion gap 28 (*)    All other components within normal limits  CBC WITH DIFFERENTIAL/PLATELET - Abnormal; Notable for the following components:   WBC 22.2 (*)    RBC 3.36 (*)    Hemoglobin 10.5 (*)    HCT 32.1 (*)    Neutro Abs 19.7 (*)    Lymphs Abs 0.8 (*)    Monocytes Absolute 1.4 (*)    Basophils Absolute 0.2 (*)    All other components within normal limits  SEDIMENTATION RATE - Abnormal; Notable for the following components:   Sed Rate 116 (*)    All other components within normal limits  GLUCOSE, CAPILLARY - Abnormal; Notable for the following components:   Glucose-Capillary 358 (*)    All other components within normal limits  BLOOD GAS, VENOUS - Abnormal; Notable for the following components:   pCO2, Ven 40 (*)    All other components within normal limits  CK - Abnormal; Notable for the following components:   Total CK 19 (*)    All other components within normal limits  BASIC METABOLIC PANEL - Abnormal; Notable for the following components:   Chloride 97 (*)    CO2 17 (*)    Glucose, Bld 367 (*)    Anion gap 26 (*)    All other components within normal limits    GLUCOSE, CAPILLARY - Abnormal; Notable for the following components:   Glucose-Capillary 343 (*)    All other components within normal limits  CULTURE, BLOOD (ROUTINE X 2)  CULTURE, BLOOD (ROUTINE X 2)  LACTIC ACID, PLASMA  LIPASE, BLOOD  MAGNESIUM  PROTIME-INR  SALICYLATE LEVEL  C-REACTIVE PROTEIN  CBG MONITORING, ED  CBG MONITORING, ED  CBG MONITORING, ED   ____________________________________________  EKG  Reviewed and interpreted by me at 8:30 AM Heart rate 120 QRS 80 QTc 480 Sinus tachycardia, mild prolongation of the QT interval. ____________________________________________  RADIOLOGY  Ct Thoracic Spine Wo Contrast  Result Date: 03/20/2018 CLINICAL DATA:  Progressive back pain.  Swelling of the lower back. EXAM: CT THORACIC SPINE WITHOUT CONTRAST TECHNIQUE: Multidetector CT images of the thoracic were obtained using the standard protocol without intravenous contrast. COMPARISON:  None. FINDINGS: Alignment: Normal. 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 Paraspinal and other soft tissues: There is abnormal gas and fluid in the soft tissues of the right side of the base of the neck and in the right supraclavicular region. Paraspinal soft tissues appear normal throughout the thoracic spine. Disc levels: There is no evidence of disc protrusion or significant disc bulging or spinal or foraminal stenosis or other significant abnormality of the thoracic spine. IMPRESSION: 1. Evidence of cellulitis involving the right side of the base of the neck extending into the right supraclavicular region with fluid and gas in the soft tissues at the base of the right side of the neck. 2. No significant abnormality of the thoracic spine. 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 Electronically Signed   By: Francene Boyers M.D.   On: 03/20/2018 11:25   Ct Lumbar Spine Wo Contrast  Addendum Date: 03/20/2018   ADDENDUM REPORT: 03/20/2018 11:44 ADDENDUM: Critical Value/emergent results were  called by telephone at the time of interpretation on 03/20/2018 at 11:30 am to Dr. Sharyn Creamer , who verbally acknowledged these results. Electronically Signed   By: Francene Boyers M.D.   On: 03/20/2018  11:44   Result Date: 03/20/2018 CLINICAL DATA:  Increasing low back pain and soft tissue swelling. EXAM: CT LUMBAR SPINE WITHOUT CONTRAST TECHNIQUE: Multidetector CT imaging of the lumbar spine was performed without intravenous contrast administration. Multiplanar CT image reconstructions were also generated. IV contrast could not be utilized due to the lack of an appropriate IV. COMPARISON:  None. FINDINGS: Segmentation: 5 lumbar type vertebrae. Alignment: Normal. Vertebrae: There is a moth-eaten appearance of the spinous processes of L3 and L4 which is worrisome for osteomyelitis. Bilateral pars defects at L5 with grade 1 spondylolisthesis. 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 Paraspinal and other soft tissues: There is an extensive abnormal fluid collection in the subcutaneous soft tissues of the posterior aspect of the back extending from approximately L1-2 to S3. This fluid collection is lobulated and measures approximately 20 x 9 x 2.5 cm. It is centered slightly to the left of midline and has a mass effect upon the adjacent posterior paraspinal muscles. There is abnormal lucency in the underlying paraspinal muscles which could represent myositis. Disc levels: T11-12: No significant abnormality. Butterfly T12 vertebra. T12-L1: No significant abnormality. L1-2: Normal disc. Abnormal edema in the posterior paraspinal musculature with adjacent fluid collection in the subcutaneous fat of the posterior aspect of the back as described above. L2-3: Normal disc. L3-4: Normal disc. Lucency in the posterior paraspinal soft tissues extends to the posterior aspect of the thecal sac on image 80 of series 4 but there is no discrete epidural abscess. L4-5: Normal disc.  No evidence of epidural abscess. L5-S1: Grade 1  spondylolisthesis. No disc bulging or protrusion. Bilateral pars defects. No visible epidural abscess. IMPRESSION: 1. Extensive abnormal fluid collection in the subcutaneous fat of the midline of the back with underlying marked abnormality of the posterior paraspinal musculature from L1-2 through S3. This is worrisome for subcutaneous abscess and myositis. 2. Moth-eaten appearance of the spinous processes of L3 and L4 consistent with osteomyelitis. 3. No discrete epidural abscess. However, the abnormal edema in the paraspinal musculature extends to the posterior aspect of the spinal canal at L3-4. 4. MRI with and without contrast may better define the extent of the soft tissue and infection and could detect epidural extension that is not apparent on this unenhanced CT scan. Electronically Signed: By: Francene Boyers M.D. On: 03/20/2018 11:18     CT scans as above discussed with radiologist ____________________________________________   PROCEDURES  Procedure(s) performed: None  Procedures  Critical Care performed: Yes, see critical care note(s)  CRITICAL CARE Performed by: Sharyn Creamer   Total critical care time: 65 minutes  Critical care time was exclusive of separately billable procedures and treating other patients.  Critical care was necessary to treat or prevent imminent or life-threatening deterioration.  Critical care was time spent personally by me on the following activities: development of treatment plan with patient and/or surrogate as well as nursing, discussions with consultants, evaluation of patient's response to treatment, examination of patient, obtaining history from patient or surrogate, ordering and performing treatments and interventions, ordering and review of laboratory studies, ordering and review of radiographic studies, pulse oximetry and re-evaluation of patient's condition.  ____________________________________________   INITIAL IMPRESSION / ASSESSMENT AND PLAN  / ED COURSE  Pertinent labs & imaging results that were available during my care of the patient were reviewed by me and considered in my medical decision making (see chart for details).  Severe worsening lower back pain without trauma.  Patient has concerning overlying lesion of the lumbar spine which  could represent abscess, tumor, mass or other lesion.  No obvious cellulitic changes.  He is afebrile, but had very high risk for abscess, epidural abscess, and complication given his history of active IV drug use.  I have ordered MRI of the thoracic and lumbar spine to further evaluate, lab work pending.  Patient in severe pain, controlling with Dilaudid.  Clinical Course as of Mar 20 1342  Sat Mar 20, 2018  1004 Labs reviewed, very high index of suspicion for epidural abscess.  Have paged neurosurgery to discuss.  The patient is having severe back pain and with the large amount of swelling in his back is not able to lay flat or tolerate much laying still, because of the severity of pain I do not believe he will be able to easily undergo an MRI of the lumbar thoracic spine at this time.  I have ordered CT scan which will be more expeditious and likely better tolerated by the patient, hopefully revealing an etiology for his pain which I highly suspect to be infection at this time.  Code sepsis page, await discussion with neurosurgery to discuss antibiotic choice at this time.   [MQ]  1124 CT lumbar spine reviewed, I have called [name] now requesting transfer as I anticipate the patient will need higher level of care given a very high clinical suspicion for epidural abscess.  Fluid resuscitation ongoing, plan to recent basic metabolic panel shortly to reevaluate for closure of anion gap.  CK salicylate level are pending though the patient denies overuse of any salicylates.  Elevated anion gap query if this could be related to a process such as lactic acidosis or DKA.  Denies use of non-store-bought  alcohols   [MQ]  1127 Discussed with Dr. Jena Gauss, radiology advices concerning findings of gas forming infection in thoracic fracture.    [MQ]  1321 Insulin drip ordered.  Patient repeat labs show normal potassium at this time.  Anion gap remains elevated, patient has been fluid resuscitated adequately and starting insulin infusion.  The patient accepted, awaiting transport to [name]   [MQ]    Clinical Course User Index [MQ] Sharyn Creamer, MD   ----------------------------------------- 8:59 AM on 03/20/2018 -----------------------------------------  Patient extremely difficult peripheral IV access.  After multiple attempts by staff, was able to cannulate the external jugular under ultrasound guidance over the right side.  Chlorhexidine prep and traditional sterile precautions utilized.  Able to draw labs from, draws easily, nursing drawing labs and will check flush thereafter.  ----------------------------------------- 10:17 AM on 03/20/2018 -----------------------------------------  The patient pain seems to be somewhat improving, still appears to be in moderate to severe pain and unable to tolerate laying flat for longer than very brief.  Despite multiple dose of hydromorphone.  Await callback from neurosurgery at [facility], Dr. Venetia Maxon.  Discussed with patient and patient lives in [place], will would preference going to [name] per patient.   ----------------------------------------- 12:36 PM on 03/20/2018 -----------------------------------------  [facility] hospitalist service Dr. Pam Drown, after discussing with Dr. Hyacinth Meeker of critical care medicine who advises the patient should be admitted to stepdown.  Will transfer for further care and management, also anticipated neurosurgical consult with Dr. Venetia Maxon.  Accepted in transfer.  ----------------------------------------- 1:43 PM on 03/20/2018 -----------------------------------------  Patient excepted  with ready bed at [name].  [facility] coming to get him.  Vital signs and patient reassessed, he is alert, reports his pain is increasing once again.  He is currently receiving insulin  infusion, multiple antibiotics, and will give additional dose of pain medication here.  [name] stable for transport, still in guarded status with need for further evaluation and neurosurgical consult anticipated by Dr. Venetia Maxon at [facility].  ____________________________________________   FINAL CLINICAL IMPRESSION(S) / ED DIAGNOSES  Final diagnoses:  Diabetic ketoacidosis without coma associated with type 1 diabetes mellitus (HCC)  Abscess  Cellulitis of back except buttock  Sepsis, due to unspecified organism [facility])      NEW MEDICATIONS STARTED DURING THIS VISIT:  New Prescriptions   No medications on file     Note:  This document was prepared using Dragon voice recognition software and may include unintentional dictation errors.     Sharyn Creamer, MD 03/20/18 1344

## 2018-03-20 NOTE — ED Notes (Signed)
Pt in CT.

## 2018-03-20 NOTE — ED Notes (Signed)
EMTALA reviewed by charge RN 

## 2018-03-20 NOTE — Progress Notes (Signed)
CBG on arrival 346.  CHG done. MRSA PCR done.

## 2018-03-21 ENCOUNTER — Inpatient Hospital Stay: Payer: Self-pay

## 2018-03-21 ENCOUNTER — Other Ambulatory Visit: Payer: Self-pay

## 2018-03-21 DIAGNOSIS — F1120 Opioid dependence, uncomplicated: Secondary | ICD-10-CM | POA: Insufficient documentation

## 2018-03-21 DIAGNOSIS — L02212 Cutaneous abscess of back [any part, except buttock]: Secondary | ICD-10-CM | POA: Diagnosis present

## 2018-03-21 DIAGNOSIS — M542 Cervicalgia: Secondary | ICD-10-CM

## 2018-03-21 DIAGNOSIS — B9561 Methicillin susceptible Staphylococcus aureus infection as the cause of diseases classified elsewhere: Secondary | ICD-10-CM

## 2018-03-21 DIAGNOSIS — M4626 Osteomyelitis of vertebra, lumbar region: Secondary | ICD-10-CM

## 2018-03-21 DIAGNOSIS — F1990 Other psychoactive substance use, unspecified, uncomplicated: Secondary | ICD-10-CM

## 2018-03-21 DIAGNOSIS — M549 Dorsalgia, unspecified: Secondary | ICD-10-CM

## 2018-03-21 DIAGNOSIS — Z9114 Patient's other noncompliance with medication regimen: Secondary | ICD-10-CM

## 2018-03-21 DIAGNOSIS — Z833 Family history of diabetes mellitus: Secondary | ICD-10-CM

## 2018-03-21 DIAGNOSIS — I10 Essential (primary) hypertension: Secondary | ICD-10-CM

## 2018-03-21 DIAGNOSIS — Z8249 Family history of ischemic heart disease and other diseases of the circulatory system: Secondary | ICD-10-CM

## 2018-03-21 DIAGNOSIS — F17210 Nicotine dependence, cigarettes, uncomplicated: Secondary | ICD-10-CM

## 2018-03-21 DIAGNOSIS — M609 Myositis, unspecified: Secondary | ICD-10-CM

## 2018-03-21 DIAGNOSIS — M869 Osteomyelitis, unspecified: Secondary | ICD-10-CM

## 2018-03-21 DIAGNOSIS — R7881 Bacteremia: Secondary | ICD-10-CM

## 2018-03-21 DIAGNOSIS — E118 Type 2 diabetes mellitus with unspecified complications: Secondary | ICD-10-CM

## 2018-03-21 DIAGNOSIS — G061 Intraspinal abscess and granuloma: Principal | ICD-10-CM

## 2018-03-21 LAB — GLUCOSE, CAPILLARY
GLUCOSE-CAPILLARY: 297 mg/dL — AB (ref 70–99)
GLUCOSE-CAPILLARY: 142 mg/dL — AB (ref 70–99)
Glucose-Capillary: 321 mg/dL — ABNORMAL HIGH (ref 70–99)
Glucose-Capillary: 247 mg/dL — ABNORMAL HIGH (ref 70–99)
Glucose-Capillary: 213 mg/dL — ABNORMAL HIGH (ref 70–99)
Glucose-Capillary: 219 mg/dL — ABNORMAL HIGH (ref 70–99)
Glucose-Capillary: 185 mg/dL — ABNORMAL HIGH (ref 70–99)
Glucose-Capillary: 165 mg/dL — ABNORMAL HIGH (ref 70–99)
Glucose-Capillary: 147 mg/dL — ABNORMAL HIGH (ref 70–99)
Glucose-Capillary: 129 mg/dL — ABNORMAL HIGH (ref 70–99)

## 2018-03-21 LAB — BLOOD CULTURE ID PANEL (REFLEXED)
ACINETOBACTER BAUMANNII: NOT DETECTED
CANDIDA ALBICANS: NOT DETECTED
CANDIDA KRUSEI: NOT DETECTED
CANDIDA PARAPSILOSIS: NOT DETECTED
Candida glabrata: NOT DETECTED
Candida tropicalis: NOT DETECTED
ENTEROBACTERIACEAE SPECIES: NOT DETECTED
ESCHERICHIA COLI: NOT DETECTED
Enterobacter cloacae complex: NOT DETECTED
Enterococcus species: NOT DETECTED
Haemophilus influenzae: NOT DETECTED
KLEBSIELLA OXYTOCA: NOT DETECTED
Klebsiella pneumoniae: NOT DETECTED
LISTERIA MONOCYTOGENES: NOT DETECTED
Methicillin resistance: NOT DETECTED
Neisseria meningitidis: NOT DETECTED
PSEUDOMONAS AERUGINOSA: NOT DETECTED
Proteus species: NOT DETECTED
STREPTOCOCCUS AGALACTIAE: NOT DETECTED
STREPTOCOCCUS PNEUMONIAE: NOT DETECTED
STREPTOCOCCUS PYOGENES: NOT DETECTED
Serratia marcescens: NOT DETECTED
Staphylococcus aureus (BCID): DETECTED — AB
Staphylococcus species: DETECTED — AB
Streptococcus species: NOT DETECTED

## 2018-03-21 LAB — BASIC METABOLIC PANEL
ANION GAP: 25 — AB (ref 5–15)
Anion gap: 12 (ref 5–15)
BUN: 14 mg/dL (ref 6–20)
BUN: 11 mg/dL (ref 6–20)
CALCIUM: 9.3 mg/dL (ref 8.9–10.3)
CHLORIDE: 106 mmol/L (ref 98–111)
CO2: 9 mmol/L — AB (ref 22–32)
CO2: 18 mmol/L — AB (ref 22–32)
CREATININE: 0.93 mg/dL (ref 0.61–1.24)
Calcium: 9.3 mg/dL (ref 8.9–10.3)
Chloride: 102 mmol/L (ref 98–111)
Creatinine, Ser: 1.53 mg/dL — ABNORMAL HIGH (ref 0.61–1.24)
GFR calc non Af Amer: 55 mL/min — ABNORMAL LOW (ref >60)
GFR calc non Af Amer: >60 mL/min (ref >60)
Glucose, Bld: 348 mg/dL — ABNORMAL HIGH (ref 70–99)
Glucose, Bld: 143 mg/dL — ABNORMAL HIGH (ref 70–99)
Potassium: 3.4 mmol/L — ABNORMAL LOW (ref 3.5–5.1)
Potassium: 2.8 mmol/L — ABNORMAL LOW (ref 3.5–5.1)
SODIUM: 136 mmol/L (ref 135–145)
Sodium: 136 mmol/L (ref 135–145)

## 2018-03-21 LAB — CBC
HCT: 34.8 % — ABNORMAL LOW (ref 39.0–52.0)
HEMOGLOBIN: 10.3 g/dL — AB (ref 13.0–17.0)
MCH: 30.7 pg (ref 26.0–34.0)
MCHC: 29.6 g/dL — ABNORMAL LOW (ref 30.0–36.0)
MCV: 103.6 fL — ABNORMAL HIGH (ref 78.0–100.0)
PLATELETS: 374 10*3/uL (ref 150–400)
RBC: 3.36 MIL/uL — AB (ref 4.22–5.81)
RDW: 13.1 % (ref 11.5–15.5)
WBC: 26.3 10*3/uL — ABNORMAL HIGH (ref 4.0–10.5)

## 2018-03-21 LAB — PROTIME-INR
INR: 1.4
PROTHROMBIN TIME: 17.1 s — AB (ref 11.4–15.2)

## 2018-03-21 LAB — MAGNESIUM: Magnesium: 1.6 mg/dL — ABNORMAL LOW (ref 1.7–2.4)

## 2018-03-21 LAB — PROCALCITONIN: Procalcitonin: 1.04 ng/mL

## 2018-03-21 MED ORDER — LORAZEPAM 2 MG/ML IJ SOLN: 1.0000 mg | Freq: Once | INTRAMUSCULAR | Status: DC | PRN

## 2018-03-21 MED ORDER — SODIUM CHLORIDE 0.9% FLUSH
10.0000 mL | Freq: Two times a day (BID) | INTRAVENOUS | Status: DC
  Administered 2018-03-21 – 2018-03-24 (×6): 10 mL
  Administered 2018-03-25: 20 mL
  Administered 2018-03-29 – 2018-04-03 (×6): 10 mL

## 2018-03-21 MED ORDER — HEPARIN SODIUM (PORCINE) 5000 UNIT/ML IJ SOLN
5000.0000 [IU] | Freq: Three times a day (TID) | INTRAMUSCULAR | Status: DC
  Administered 2018-03-21 – 2018-04-11 (×38): 5000 [IU] via SUBCUTANEOUS
  Filled 2018-03-21 (×56): qty 1

## 2018-03-21 MED ORDER — INSULIN GLARGINE 100 UNIT/ML ~~LOC~~ SOLN
20.0000 [IU] | Freq: Two times a day (BID) | SUBCUTANEOUS | Status: DC
  Administered 2018-03-21 – 2018-03-24 (×6): 20 [IU] via SUBCUTANEOUS
  Filled 2018-03-21 (×8): qty 0.2

## 2018-03-21 MED ORDER — CEFAZOLIN SODIUM-DEXTROSE 2-4 GM/100ML-% IV SOLN
2.0000 g | Freq: Three times a day (TID) | INTRAVENOUS | Status: DC
  Administered 2018-03-21 – 2018-03-30 (×29): 2 g via INTRAVENOUS
  Filled 2018-03-21 (×33): qty 100

## 2018-03-21 MED ORDER — HYDRALAZINE HCL 20 MG/ML IJ SOLN
10.0000 mg | Freq: Four times a day (QID) | INTRAMUSCULAR | Status: DC | PRN
  Administered 2018-03-22 – 2018-03-23 (×3): 10 mg via INTRAVENOUS
  Filled 2018-03-21 (×3): qty 1

## 2018-03-21 MED ORDER — SODIUM CHLORIDE 0.9% FLUSH: 10.0000 mL | INTRAVENOUS | Status: DC | PRN

## 2018-03-21 MED ORDER — MAGNESIUM SULFATE 2 GM/50ML IV SOLN
2.0000 g | Freq: Once | INTRAVENOUS | Status: AC
  Administered 2018-03-21: 2 g via INTRAVENOUS
  Filled 2018-03-21: qty 50

## 2018-03-21 MED ORDER — CLONIDINE HCL 0.1 MG PO TABS
0.1000 mg | ORAL_TABLET | Freq: Three times a day (TID) | ORAL | Status: DC
  Administered 2018-03-21 (×3): 0.1 mg via ORAL
  Filled 2018-03-21 (×3): qty 1

## 2018-03-21 MED ORDER — BUPRENORPHINE HCL-NALOXONE HCL 2-0.5 MG SL SUBL: 1.0000 | SUBLINGUAL_TABLET | SUBLINGUAL | Status: AC | PRN

## 2018-03-21 MED ORDER — BUPRENORPHINE HCL-NALOXONE HCL 8-2 MG SL SUBL
1.0000 | SUBLINGUAL_TABLET | Freq: Two times a day (BID) | SUBLINGUAL | Status: DC
  Administered 2018-03-22 – 2018-03-23 (×2): 1 via SUBLINGUAL
  Filled 2018-03-21 (×2): qty 1

## 2018-03-21 MED ORDER — LORAZEPAM 1 MG PO TABS: 1.0000 mg | ORAL_TABLET | Freq: Once | ORAL | Status: DC

## 2018-03-21 MED ORDER — SODIUM CHLORIDE 0.9 % IV SOLN
INTRAVENOUS | Status: DC
  Administered 2018-03-21: 18:00:00 via INTRAVENOUS

## 2018-03-21 MED ORDER — POTASSIUM CHLORIDE CRYS ER 20 MEQ PO TBCR
40.0000 meq | EXTENDED_RELEASE_TABLET | Freq: Once | ORAL | Status: AC
  Administered 2018-03-21: 40 meq via ORAL
  Filled 2018-03-21: qty 2

## 2018-03-21 NOTE — Consult Note (Signed)
Chief Complaint: Patient was seen in consultation today for image guided aspiration of subcutaneous lumbar fluid collection   Referring Physician(s): Xu,F  Supervising Physician: Irish Lack  Patient Status: [facility] - [facility]  History of Present Illness: Tony Long is a 42 y.o. male with history of diabetes, hypertension, IV drug abuse who was admitted to [facility] on 03/20/2018 and subsequently transferred to [facility] for further evaluation of persistent neck/back pain and imaging findings of abnormal fluid collection in the subcu fat of the midline of the back with underlying marked abnormality of the posterior paraspinal musculature L1/2 through S3. There was moth-eaten appearance of the spinous processes of L3 and L4 consistent with osteomyelitis.  He also has evidence of cellulitis involving the right side of the base of the neck extending into the right supraclavicular region with fluid and gas in the soft tissues at the base of the right side of the neck.  Request now received for image guided aspiration for further evaluation.  Past Medical History:  Diagnosis Date  . Diabetes mellitus without complication (HCC)     History reviewed. No pertinent surgical history.  Allergies: Patient has no known allergies.  Medications: Prior to Admission medications   Medication Sig Start Date End Date Taking? Authorizing Provider  gabapentin (NEURONTIN) 300 MG capsule Take 1 capsule (300 mg total) by mouth 3 (three) times daily. 02/26/18  Yes [name], FNP  Insulin Glargine (LANTUS) 100 UNIT/ML Solostar Pen Inject 50 Units into the skin daily at 10 pm. Patient taking differently: Inject 40 Units into the skin at bedtime.  02/26/18  Yes [name], FNP  insulin lispro (HUMALOG) 100 UNIT/ML injection Inject 0.1 mLs (10 Units total) into the skin 3 (three) times daily with meals. 03/04/18  Yes [name], FNP  cyclobenzaprine (FLEXERIL) 5 MG tablet Take 1-2 tablets  (5-10 mg total) by mouth 3 (three) times daily as needed for muscle spasms. Patient not taking: Reported on 03/20/2018 03/07/18   Evon Slack, PA-C  etodolac (LODINE) 500 MG tablet Take 1 tablet (500 mg total) by mouth 2 (two) times daily. Patient not taking: Reported on 03/20/2018 03/07/18   Amador Cunas C, PA-C  glucose blood test strip Use as instructed 03/13/16   Henrietta Hoover, NP  HYDROcodone-acetaminophen (NORCO) 5-325 MG tablet Take 1 tablet by mouth every 6 (six) hours as needed for moderate pain. Patient not taking: Reported on 03/20/2018 03/07/18   Evon Slack, PA-C  ibuprofen (ADVIL,MOTRIN) 600 MG tablet Take 1 tablet (600 mg total) by mouth every 8 (eight) hours as needed. Patient not taking: Reported on 02/26/2018 10/14/17   [name], FNP  Lancets [facility] ULTRASOFT) lancets Use as instructed 03/13/16   Henrietta Hoover, NP  LANTUS 100 UNIT/ML injection INJECT 40 UNITS TOTAL INTO THE SKIN AT BEDTIME. Patient not taking: Reported on 03/20/2018 03/04/18   [name], FNP  lisinopril (PRINIVIL,ZESTRIL) 5 MG tablet Take 1 tablet (5 mg total) by mouth daily. Patient not taking: Reported on 02/26/2018 10/14/17   [name], FNP  metFORMIN (GLUCOPHAGE) 500 MG tablet Take 1 tablet (500 mg total) by mouth 2 (two) times daily with a meal. Patient not taking: Reported on 10/14/2017 12/11/16   Bing Neighbors, FNP  pravastatin (PRAVACHOL) 40 MG tablet Take 1 tablet (40 mg total) by mouth daily. Patient not taking: Reported on 12/11/2016 06/02/16   Henrietta Hoover, NP  sildenafil (VIAGRA) 25 MG tablet Take 1 tablet (25  mg total) by mouth daily as needed for erectile dysfunction. Patient not taking: Reported on 03/20/2018 02/26/18   [name], FNP     Family History  Problem Relation Age of Onset  . Heart disease Father   . Diabetes Father   . Diabetes Paternal Uncle     Social History   Socioeconomic History  . Marital status: Single    Spouse name:  Not on file  . Number of children: Not on file  . Years of education: Not on file  . Highest education level: Not on file  Occupational History  . Not on file  Social Needs  . Financial resource strain: Not on file  . Food insecurity:    Worry: Not on file    Inability: Not on file  . Transportation needs:    Medical: Not on file    Non-medical: Not on file  Tobacco Use  . Smoking status: Current Every Day Smoker    Packs/day: 1.00  . Smokeless tobacco: Never Used  Substance and Sexual Activity  . Alcohol use: Not Currently    Comment: seldom  . Drug use: No    Comment: pt mother said hx of use  . Sexual activity: Yes    Partners: Female  Lifestyle  . Physical activity:    Days per week: Not on file    Minutes per session: Not on file  . Stress: Not on file  Relationships  . Social connections:    Talks on phone: Not on file    Gets together: Not on file    Attends religious service: Not on file    Active member of club or organization: Not on file    Attends meetings of clubs or organizations: Not on file    Relationship status: Not on file  Other Topics Concern  . Not on file  Social History Narrative  . Not on file      Review of Systems see above; denies fever, headache, chest pain, dyspnea, cough, abdominal pain, nausea, vomiting or bleeding.  Vital Signs: BP (!) 164/95 (BP Location: Left Arm)   Pulse (!) 116   Temp 98 F (36.7 C) (Oral)   Resp (!) 21   Ht 5\' 11"  (1.803 m)   Wt 150 lb (68 kg)   SpO2 99%   BMI 20.92 kg/m   Physical Exam awake, alert.  Chest clear to auscultation bilaterally.  Heart with tachycardic but regular rhythm.  Abdomen soft, positive bowel sounds, nontender.  Erythema/cellulitic changes posterior neck; palpable subcutaneous ST fullness mid lumbar region; no LE edema  Imaging: Ct Thoracic Spine Wo Contrast  Result Date: 03/20/2018 CLINICAL DATA:  Progressive back pain.  Swelling of the lower back. EXAM: CT THORACIC SPINE  WITHOUT CONTRAST TECHNIQUE: Multidetector CT images of the thoracic were obtained using the standard protocol without intravenous contrast. COMPARISON:  None. FINDINGS: Alignment: Normal. 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 Paraspinal and other soft tissues: There is abnormal gas and fluid in the soft tissues of the right side of the base of the neck and in the right supraclavicular region. Paraspinal soft tissues appear normal throughout the thoracic spine. Disc levels: There is no evidence of disc protrusion or significant disc bulging or spinal or foraminal stenosis or other significant abnormality of the thoracic spine. IMPRESSION: 1. Evidence of cellulitis involving the right side of the base of the neck extending into the right supraclavicular region with fluid and gas in the soft tissues at the base  of the right side of the neck. 2. No significant abnormality of the thoracic spine. 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 Electronically Signed   By: Francene BoyersJames  Maxwell M.D.   On: 03/20/2018 11:25   Ct Lumbar Spine Wo Contrast  Addendum Date: 03/20/2018   ADDENDUM REPORT: 03/20/2018 11:44 ADDENDUM: Critical Value/emergent results were called by telephone at the time of interpretation on 03/20/2018 at 11:30 am to Dr. Sharyn CreamerMARK QUALE , who verbally acknowledged these results. Electronically Signed   By: Francene BoyersJames  Maxwell M.D.   On: 03/20/2018 11:44   Result Date: 03/20/2018 CLINICAL DATA:  Increasing low back pain and soft tissue swelling. EXAM: CT LUMBAR SPINE WITHOUT CONTRAST TECHNIQUE: Multidetector CT imaging of the lumbar spine was performed without intravenous contrast administration. Multiplanar CT image reconstructions were also generated. IV contrast could not be utilized due to the lack of an appropriate IV. COMPARISON:  None. FINDINGS: Segmentation: 5 lumbar type vertebrae. Alignment: Normal. Vertebrae: There is a moth-eaten appearance of the spinous processes of L3 and L4 which is worrisome for  osteomyelitis. Bilateral pars defects at L5 with grade 1 spondylolisthesis. 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 Paraspinal and other soft tissues: There is an extensive abnormal fluid collection in the subcutaneous soft tissues of the posterior aspect of the back extending from approximately L1-2 to S3. This fluid collection is lobulated and measures approximately 20 x 9 x 2.5 cm. It is centered slightly to the left of midline and has a mass effect upon the adjacent posterior paraspinal muscles. There is abnormal lucency in the underlying paraspinal muscles which could represent myositis. Disc levels: T11-12: No significant abnormality. Butterfly T12 vertebra. T12-L1: No significant abnormality. L1-2: Normal disc. Abnormal edema in the posterior paraspinal musculature with adjacent fluid collection in the subcutaneous fat of the posterior aspect of the back as described above. L2-3: Normal disc. L3-4: Normal disc. Lucency in the posterior paraspinal soft tissues extends to the posterior aspect of the thecal sac on image 80 of series 4 but there is no discrete epidural abscess. L4-5: Normal disc.  No evidence of epidural abscess. L5-S1: Grade 1 spondylolisthesis. No disc bulging or protrusion. Bilateral pars defects. No visible epidural abscess. IMPRESSION: 1. Extensive abnormal fluid collection in the subcutaneous fat of the midline of the back with underlying marked abnormality of the posterior paraspinal musculature from L1-2 through S3. This is worrisome for subcutaneous abscess and myositis. 2. Moth-eaten appearance of the spinous processes of L3 and L4 consistent with osteomyelitis. 3. No discrete epidural abscess. However, the abnormal edema in the paraspinal musculature extends to the posterior aspect of the spinal canal at L3-4. 4. MRI with and without contrast may better define the extent of the soft tissue and infection and could detect epidural extension that is not apparent on this unenhanced CT scan.  Electronically Signed: By: Francene BoyersJames  Maxwell M.D. On: 03/20/2018 11:18   [place] Ekg Site Rite  Result Date: 03/21/2018 If Site Rite image not attached, placement could not be confirmed due to current cardiac rhythm.   Labs:  CBC: Recent Labs    03/20/18 0759  WBC 22.2*  HGB 10.5*  HCT 32.1*  PLT 388    COAGS: Recent Labs    03/20/18 0759  INR 1.18    BMP: Recent Labs    10/14/17 1619 03/20/18 0759 03/20/18 1235  NA 137 138 140  K 4.4 3.7 3.8  CL 95* 92* 97*  CO2 26 18* 17*  GLUCOSE 292* 368* 367*  BUN 9 11 14  CALCIUM 10.6* 9.2 9.2  CREATININE 0.81 0.97 0.97  GFRNONAA 110 >60 >60  GFRAA 128 >60 >60    LIVER FUNCTION TESTS: Recent Labs    10/14/17 1619 03/20/18 0759  BILITOT 0.4 2.3*  AST 17 13*  ALT 18 8  ALKPHOS 140* 165*  PROT 8.5 7.5  ALBUMIN 4.9 2.3*    TUMOR MARKERS: No results for input(s): AFPTM, CEA, CA199, CHROMGRNA in the last 8760 hours.  Assessment and Plan: 42 y.o. male with history of diabetes, hypertension, IV drug abuse who was admitted to [facility] on 03/20/2018 and subsequently transferred to [facility] for further evaluation of persistent neck/back pain and imaging findings of abnormal fluid collection in the subcu fat of the midline of the back with underlying marked abnormality of the posterior paraspinal musculature L1/2 through S3. There was moth-eaten appearance of the spinous processes of L3 and L4 consistent with osteomyelitis.  He also has evidence of cellulitis involving the right side of the base of the neck extending into the right supraclavicular region with fluid and gas in the soft tissues at the base of the right side of the neck.  Request now received for image guided aspiration for further evaluation.  Imaging studies have been reviewed by Dr. Fredia Sorrow. Lumbar collection is amenable to aspiration.  Case has also been d/w general surgery team and plans are now for I&D of lumbar SQ collection on 9/23. In light of this we will hold  on aspiration.    Thank you for this interesting consult.  I greatly enjoyed meeting Tony Long and look forward to participating in their care.  A copy of this report was sent to the requesting provider on this date.  Electronically Signed: D. Jeananne Rama, PA-C 03/21/2018, 10:14 AM   I spent a total of  25 minutes   in face to face in clinical consultation, greater than 50% of which was counseling/coordinating care for possible lumbar subcutaneous fluid collection aspiration

## 2018-03-21 NOTE — Progress Notes (Signed)
Subjective: Patient reports feeling better today.  Objective: Vital signs in last 24 hours: Temp:  [98 F (36.7 C)] 98 F (36.7 C) (09/21 2100) Pulse Rate:  [113-119] 116 (09/21 1905) Resp:  [15-25] 21 (09/21 2221) BP: (149-168)/(86-95) 164/95 (09/21 2100) SpO2:  [98 %-99 %] 99 % (09/21 2100) Weight:  [68 kg] 68 kg (09/22 0602)  Intake/Output from previous day: No intake/output data recorded. Intake/Output this shift: No intake/output data recorded.  Physical Exam: In midst of placement of central venous catheter.  Patient states he is not quite as painful as he was yesterday.  Strength full in both arms and legs.  He denies numbness.  Lab Results: Recent Labs    03/20/18 0759  WBC 22.2*  HGB 10.5*  HCT 32.1*  PLT 388   BMET Recent Labs    03/20/18 0759 03/20/18 1235  NA 138 140  K 3.7 3.8  CL 92* 97*  CO2 18* 17*  GLUCOSE 368* 367*  BUN 11 14  CREATININE 0.97 0.97  CALCIUM 9.2 9.2    Studies/Results: Ct Thoracic Spine Wo Contrast  Result Date: 03/20/2018 CLINICAL DATA:  Progressive back pain.  Swelling of the lower back. EXAM: CT THORACIC SPINE WITHOUT CONTRAST TECHNIQUE: Multidetector CT images of the thoracic were obtained using the standard protocol without intravenous contrast. COMPARISON:  None. FINDINGS: Alignment: Normal. 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 Paraspinal and other soft tissues: There is abnormal gas and fluid in the soft tissues of the right side of the base of the neck and in the right supraclavicular region. Paraspinal soft tissues appear normal throughout the thoracic spine. Disc levels: There is no evidence of disc protrusion or significant disc bulging or spinal or foraminal stenosis or other significant abnormality of the thoracic spine. IMPRESSION: 1. Evidence of cellulitis involving the right side of the base of the neck extending into the right supraclavicular region with fluid and gas in the soft tissues at the base of the  right side of the neck. 2. No significant abnormality of the thoracic spine. 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 Electronically Signed   By: Francene Boyers M.D.   On: 03/20/2018 11:25   Ct Lumbar Spine Wo Contrast  Addendum Date: 03/20/2018   ADDENDUM REPORT: 03/20/2018 11:44 ADDENDUM: Critical Value/emergent results were called by telephone at the time of interpretation on 03/20/2018 at 11:30 am to Dr. Sharyn Creamer , who verbally acknowledged these results. Electronically Signed   By: Francene Boyers M.D.   On: 03/20/2018 11:44   Result Date: 03/20/2018 CLINICAL DATA:  Increasing low back pain and soft tissue swelling. EXAM: CT LUMBAR SPINE WITHOUT CONTRAST TECHNIQUE: Multidetector CT imaging of the lumbar spine was performed without intravenous contrast administration. Multiplanar CT image reconstructions were also generated. IV contrast could not be utilized due to the lack of an appropriate IV. COMPARISON:  None. FINDINGS: Segmentation: 5 lumbar type vertebrae. Alignment: Normal. Vertebrae: There is a moth-eaten appearance of the spinous processes of L3 and L4 which is worrisome for osteomyelitis. Bilateral pars defects at L5 with grade 1 spondylolisthesis. 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 Paraspinal and other soft tissues: There is an extensive abnormal fluid collection in the subcutaneous soft tissues of the posterior aspect of the back extending from approximately L1-2 to S3. This fluid collection is lobulated and measures approximately 20 x 9 x 2.5 cm. It is centered slightly to the left of midline and has a mass effect upon the adjacent posterior paraspinal muscles. There is abnormal lucency in  the underlying paraspinal muscles which could represent myositis. Disc levels: T11-12: No significant abnormality. Butterfly T12 vertebra. T12-L1: No significant abnormality. L1-2: Normal disc. Abnormal edema in the posterior paraspinal musculature with adjacent fluid collection in the subcutaneous fat of  the posterior aspect of the back as described above. L2-3: Normal disc. L3-4: Normal disc. Lucency in the posterior paraspinal soft tissues extends to the posterior aspect of the thecal sac on image 80 of series 4 but there is no discrete epidural abscess. L4-5: Normal disc.  No evidence of epidural abscess. L5-S1: Grade 1 spondylolisthesis. No disc bulging or protrusion. Bilateral pars defects. No visible epidural abscess. IMPRESSION: 1. Extensive abnormal fluid collection in the subcutaneous fat of the midline of the back with underlying marked abnormality of the posterior paraspinal musculature from L1-2 through S3. This is worrisome for subcutaneous abscess and myositis. 2. Moth-eaten appearance of the spinous processes of L3 and L4 consistent with osteomyelitis. 3. No discrete epidural abscess. However, the abnormal edema in the paraspinal musculature extends to the posterior aspect of the spinal canal at L3-4. 4. MRI with and without contrast may better define the extent of the soft tissue and infection and could detect epidural extension that is not apparent on this unenhanced CT scan. Electronically Signed: By: Francene BoyersJames  Maxwell M.D. On: 03/20/2018 11:18   [place] Ekg Site Rite  Result Date: 03/21/2018 If Site Rite image not attached, placement could not be confirmed due to current cardiac rhythm.   Assessment/Plan: Plan is for I & D of subcutaneous abscesses per General surgery.  My suspicion of ESA remains low.    LOS: 1 day    Dorian HeckleSTERN,Shamel Galyean D, MD 03/21/2018, 12:26 PM

## 2018-03-21 NOTE — Consult Note (Signed)
[facility] Face-to-Face Psychiatry Consult   Reason for Consult:  ''evaluation and assessment of co-morbid psychiatric diagnosis.'' Referring Physician:  Dr. 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 Back abscess [L02.212] 03/21/2018  . Bacteremia due to Gram-positive bacteria [R78.81] 03/21/2018  . 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 Osteomyelitis ([place]) [M86.9] 03/20/2018  . Chronic hepatitis C without hepatic coma ([place]) [B18.2] 07/02/2016  . Diabetes ([place]) [E11.9] 03/14/2016  . Non compliance w medication regimen [Z91.14] 03/14/2016    Total Time spent with patient: 45 minutes  Subjective:   Tony Long is a 42 y.o. male patient admitted with severe neck and back pain.  HPI: Patient who reports history of insulin dependent DM2, HTN, h/o IVDU who was admitted due to neck pain, back pain and abscess in the lower back. Patient was interviewed in the presence of his father and both denies that patient has history of mental illness but reports that patient has a long history poly substance dependence and drug of choice is opioid. Currently, patient denies depression, anxiety, psychosis, delusions, HI/SI but worries about his back pain. Father states that his son needs his pain taking care of not a psychiatrist. Urine toxicology on admission was positive for cocaine and opioid.  Past Psychiatric History: Substance dependence  Risk to Self:  denies Risk to Others:  denies Prior Inpatient Therapy:  Numerous Rehab facility Prior Outpatient Therapy:    Past Medical History:  Past Medical History:  Diagnosis Date  . Diabetes mellitus without complication ([place])    History reviewed. No pertinent surgical history. Family History:  Family History  Problem  Relation Age of Onset  . Heart disease Father   . Diabetes Father   . Diabetes Paternal Uncle    Family Psychiatric  History:  Social History:  Social History   Substance and Sexual Activity  Alcohol Use Not Currently   Comment: seldom     Social History   Substance and Sexual Activity  Drug Use No   Comment: pt mother said hx of use    Social History   Socioeconomic History  . Marital status: Single    Spouse name: Not on file  . Number of children: Not on file  . Years of education: Not on file  . Highest education level: Not on file  Occupational History  . Not on file  Social Needs  . Financial resource strain: Not on file  . Food insecurity:    Worry: Not on file    Inability: Not on file  . Transportation needs:    Medical: Not on file    Non-medical: Not on file  Tobacco Use  . Smoking status: Current Every Day Smoker    Packs/day: 1.00  . Smokeless tobacco: Never Used  Substance and Sexual Activity  . Alcohol use: Not Currently    Comment: seldom  . Drug use: No    Comment: pt mother said hx of use  . Sexual activity: Yes    Partners: Female  Lifestyle  . Physical activity:    Days per week: Not on file    Minutes per session: Not on file  . Stress: Not on file  Relationships  . Social connections:    Talks on phone: Not on file    Gets together: Not on file  Attends religious service: Not on file    Active member of club or organization: Not on file    Attends meetings of clubs or organizations: Not on file    Relationship status: Not on file  Other Topics Concern  . Not on file  Social History Narrative  . Not on file   Additional Social History:    Allergies:  No Known Allergies  Labs:  Results for orders placed or performed during the hospital encounter of 03/20/18 (from the past 48 hour(s))  Glucose, capillary     Status: Abnormal   Collection Time: 03/20/18  3:41 PM  Result Value Ref Range   Glucose-Capillary 346 (H) 70 - 99  mg/dL  MRSA PCR Screening     Status: None   Collection Time: 03/20/18  3:50 PM  Result Value Ref Range   MRSA by PCR NEGATIVE NEGATIVE    Comment:        The GeneXpert MRSA Assay (FDA approved for NASAL specimens only), is one component of a comprehensive MRSA colonization surveillance program. It is not intended to diagnose MRSA infection nor to guide or monitor treatment for MRSA infections. Performed at [facility], [place] [address] [place], [place] [address]   Urine rapid drug screen (hosp performed)     Status: Abnormal   Collection Time: 03/20/18  4:22 PM  Result Value Ref Range   Opiates POSITIVE (A) NONE DETECTED   Cocaine POSITIVE (A) NONE DETECTED   Benzodiazepines NONE DETECTED NONE DETECTED   Amphetamines NONE DETECTED NONE DETECTED   Tetrahydrocannabinol NONE DETECTED NONE DETECTED   Barbiturates NONE DETECTED NONE DETECTED    Comment: (NOTE) DRUG SCREEN FOR MEDICAL PURPOSES ONLY.  IF CONFIRMATION IS NEEDED FOR ANY PURPOSE, NOTIFY LAB WITHIN 5 DAYS. LOWEST DETECTABLE LIMITS FOR URINE DRUG SCREEN Drug Class                     Cutoff (ng/mL) Amphetamine and metabolites    1000 Barbiturate and metabolites    200 Benzodiazepine                 892 Tricyclics and metabolites     300 Opiates and metabolites        300 Cocaine and metabolites        300 THC                            50 Performed at [facility], [place] [address] [place], [place] [address]   Urinalysis, Routine w reflex microscopic     Status: Abnormal   Collection Time: 03/20/18  4:22 PM  Result Value Ref Range   Color, Urine YELLOW (A) YELLOW   APPearance CLEAR (A) CLEAR   Specific Gravity, Urine 1.015 1.005 - 1.030   pH 5.0 5.0 - 8.0   Glucose, UA >500 (A) NEGATIVE mg/dL   Hgb urine dipstick SMALL (A) NEGATIVE   Bilirubin Urine NEGATIVE NEGATIVE   Ketones, ur 80 (A) NEGATIVE mg/dL   Protein, ur NEGATIVE NEGATIVE mg/dL   Nitrite NEGATIVE NEGATIVE   Leukocytes, UA NEGATIVE  NEGATIVE   RBC / HPF 0-5 0 - 5 RBC/hpf   WBC, UA 0-5 0 - 5 WBC/hpf   Bacteria, UA NONE SEEN NONE SEEN   Mucus PRESENT     Comment: Performed at [facility] [address] [place], [place] [address]  Glucose, capillary  Status: Abnormal   Collection Time: 03/20/18  4:48 PM  Result Value Ref Range   Glucose-Capillary 294 (H) 70 - 99 mg/dL  Glucose, capillary     Status: Abnormal   Collection Time: 03/20/18  6:27 PM  Result Value Ref Range   Glucose-Capillary 247 (H) 70 - 99 mg/dL  Glucose, capillary     Status: Abnormal   Collection Time: 03/20/18  7:28 PM  Result Value Ref Range   Glucose-Capillary 231 (H) 70 - 99 mg/dL  Glucose, capillary     Status: Abnormal   Collection Time: 03/20/18  8:45 PM  Result Value Ref Range   Glucose-Capillary 257 (H) 70 - 99 mg/dL  Glucose, capillary     Status: Abnormal   Collection Time: 03/20/18  9:59 PM  Result Value Ref Range   Glucose-Capillary 258 (H) 70 - 99 mg/dL  CBC     Status: Abnormal   Collection Time: 03/21/18  1:37 PM  Result Value Ref Range   WBC 26.3 (H) 4.0 - 10.5 K/uL    Comment: REPEATED TO VERIFY   RBC 3.36 (L) 4.22 - 5.81 MIL/uL   Hemoglobin 10.3 (L) 13.0 - 17.0 g/dL   HCT 34.8 (L) 39.0 - 52.0 %   MCV 103.6 (H) 78.0 - 100.0 fL   MCH 30.7 26.0 - 34.0 pg   MCHC 29.6 (L) 30.0 - 36.0 g/dL   RDW 13.1 11.5 - 15.5 %   Platelets 374 150 - 400 K/uL    Comment: Performed at [facility], [place] [address] [place], [place] [address]  Protime-INR     Status: Abnormal   Collection Time: 03/21/18  1:37 PM  Result Value Ref Range   Prothrombin Time 17.1 (H) 11.4 - 15.2 seconds   INR 1.40     Comment: Performed at [facility] [address] [place], [place] [address]  Basic metabolic panel     Status: Abnormal   Collection Time: 03/21/18  1:38 PM  Result Value Ref Range   Sodium 136 135 - 145 mmol/L   Potassium 3.4 (L) 3.5 - 5.1 mmol/L   Chloride 102 98 - 111 mmol/L   CO2 9 (L) 22 - 32 mmol/L   Glucose,  Bld 348 (H) 70 - 99 mg/dL   BUN 14 6 - 20 mg/dL   Creatinine, Ser 1.53 (H) 0.61 - 1.24 mg/dL   Calcium 9.3 8.9 - 10.3 mg/dL   GFR calc non Af Amer 55 (L) >60 mL/min   GFR calc Af Amer >60 >60 mL/min    Comment: (NOTE) The eGFR has been calculated using the CKD EPI equation. This calculation has not been validated in all clinical situations. eGFR's persistently <60 mL/min signify possible Chronic Kidney Disease.    Anion gap 25 (H) 5 - 15    Comment: Performed at [facility], [place] [address], [place], [place] [address]  Magnesium     Status: Abnormal   Collection Time: 03/21/18  1:38 PM  Result Value Ref Range   Magnesium 1.6 (L) 1.7 - 2.4 mg/dL    Comment: Performed at [facility] [address] [place], [place] [address]    Current Facility-Administered Medications  Medication Dose Route Frequency Provider Last Rate Last Dose  . 0.9 %  sodium chloride infusion   Intravenous Continuous Florencia Reasons, MD   Stopped at 03/20/18 1938  . 0.9 %  sodium chloride infusion   Intravenous Continuous Florencia Reasons, MD  Stopped at 03/20/18 2103  . buprenorphine-naloxone (SUBOXONE) 2-0.5 mg per SL tablet 1 tablet  1 tablet Sublingual PRN Thurnell Lose, MD      . Derrill Memo ON 03/22/2018] buprenorphine-naloxone (SUBOXONE) 8-2 mg per SL tablet 1 tablet  1 tablet Sublingual BID Lala Lund K, MD      . ceFAZolin (ANCEF) IVPB 2g/100 mL premix  2 g Intravenous Q8H Thayer Headings, MD 200 mL/hr at 03/21/18 1259 2 g at 03/21/18 1259  . cloNIDine (CATAPRES) tablet 0.1 mg  0.1 mg Oral TID Thurnell Lose, MD   0.1 mg at 03/21/18 1259  . dextrose 5 %-0.45 % sodium chloride infusion   Intravenous Continuous Florencia Reasons, MD   Stopped at 03/20/18 2240  . gabapentin (NEURONTIN) capsule 300 mg  300 mg Oral TID Florencia Reasons, MD   300 mg at 03/21/18 1126  . heparin injection 5,000 Units  5,000 Units Subcutaneous Q8H Lala Lund K, MD      . hydrALAZINE (APRESOLINE) injection 10 mg  10 mg Intravenous Q6H PRN  Thurnell Lose, MD      . HYDROmorphone (DILAUDID) injection 2 mg  2 mg Intravenous Q3H PRN Florencia Reasons, MD   2 mg at 03/21/18 1258  . insulin glargine (LANTUS) injection 20 Units  20 Units Subcutaneous BID Thurnell Lose, MD   20 Units at 03/21/18 1131  . insulin regular (NOVOLIN R,HUMULIN R) 100 Units in sodium chloride 0.9 % 100 mL (1 Units/mL) infusion   Intravenous Continuous Florencia Reasons, MD   Stopped at 03/20/18 2240  . LORazepam (ATIVAN) injection 1 mg  1 mg Intravenous Once PRN Thurnell Lose, MD      . LORazepam (ATIVAN) tablet 1 mg  1 mg Oral Once Bodenheimer, Charles A, NP      . magnesium sulfate IVPB 2 g 50 mL  2 g Intravenous Once Thurnell Lose, MD      . nicotine (NICODERM CQ - dosed in mg/24 hours) patch 21 mg  21 mg Transdermal Daily Florencia Reasons, MD   21 mg at 03/21/18 1126  . oxyCODONE-acetaminophen (PERCOCET/ROXICET) 5-325 MG per tablet 1 tablet  1 tablet Oral Q3H PRN Florencia Reasons, MD   1 tablet at 03/21/18 [id number]  . potassium chloride SA (K-DUR,KLOR-CON) CR tablet 40 mEq  40 mEq Oral Once Lala Lund K, MD      . sodium chloride flush (NS) 0.9 % injection 10-40 mL  10-40 mL Intracatheter Q12H Thurnell Lose, MD   10 mL at 03/21/18 1309  . sodium chloride flush (NS) 0.9 % injection 10-40 mL  10-40 mL Intracatheter PRN Thurnell Lose, MD        Musculoskeletal: Strength & Muscle Tone: not tested Gait & Station: unable to stand Patient leans: N/A  Psychiatric Specialty Exam: Physical Exam  Psychiatric: His affect is blunt. His speech is delayed and slurred. He is withdrawn. Cognition and memory are normal. He expresses impulsivity.    Review of Systems  Constitutional: Positive for malaise/fatigue.  Musculoskeletal: Positive for back pain, myalgias and neck pain.  Skin: Negative.   Neurological: Negative.   Psychiatric/Behavioral: Positive for substance abuse.    Blood pressure (!) 164/95, pulse (!) 116, temperature 98 F (36.7 C), temperature source Oral,  resp. rate (!) 21, height '5\' 11"'  (1.803 m), weight 68 kg, SpO2 99 %.Body mass index is 20.92 kg/m.  General Appearance: Casual  Eye Contact:  Minimal  Speech:  Slow  Volume:  Decreased  Mood:  Dysphoric  Affect:  Congruent  Thought Process:  Coherent  Orientation:  Full (Time, Place, and Person)  Thought Content:  Logical  Suicidal Thoughts:  No  Homicidal Thoughts:  No  Memory:  Immediate;   Good Recent;   Good Remote;   Good  Judgement:  Intact  Insight:  Fair  Psychomotor Activity:  Psychomotor Retardation  Concentration:  Concentration: Fair and Attention Span: Fair  Recall:  [employer] of Knowledge:  Fair  Language:  Good  Akathisia:  No  Handed:  Right  AIMS (if indicated):     Assets:  Desire for Improvement Others:  family support   ADL's:  Intact  Cognition:  WNL  Sleep:    fair     Treatment Plan Summary: 42 year old man with history of Insulin dependent DM2, HTN, h/o IVDU who was admitted due to severe back pain and abscess. Patient reports long history of polysubstance dependence but denies any prior history of psychiatric issues. Urine toxicology is positive for cocaine and opioid on admission.  Recommendations: -Consider Opioid detoxifications if patient is in withdrawal. -Be careful with combining Dilaudid/Percocet/Benzodiazepine with Suboxone to avoid excessive sedation/respiratory compromise. -Psychiatric is signing out at this time-there is no evidence of psychiatric diagnosis at this time other than substance abuse.  Disposition: No evidence of imminent risk to self or others at present.   Re-consult psych as needed  Corena Pilgrim, MD 03/21/2018 2:55 PM

## 2018-03-21 NOTE — Progress Notes (Signed)
CSW received a consult for substance abuse resources for pt.  CSW met with pt's father at bedside pt was asleep. Pt's father will give resources to to pt.  No further needs at this time.  CSW signing off.  Reed Breech LCSWA [phone]

## 2018-03-21 NOTE — Progress Notes (Signed)
PHARMACY - PHYSICIAN COMMUNICATION CRITICAL VALUE ALERT - BLOOD CULTURE IDENTIFICATION (BCID)  Tony Long is an 42 y.o. male who [facility] to [facility] with a chief complaint of   Assessment:  2/4 MSSA (include suspected source if known)  Name of physician (or Provider) [facility]  Current antibiotics: n/a  Changes to prescribed antibiotics recommended:  TBD  Results for orders placed or performed during the hospital encounter of 03/20/18  Blood Culture ID Panel (Reflexed) (Collected: 03/20/2018  7:59 AM)  Result Value Ref Range   Enterococcus species NOT DETECTED NOT DETECTED   Listeria monocytogenes NOT DETECTED NOT DETECTED   Staphylococcus species DETECTED (A) NOT DETECTED   Staphylococcus aureus DETECTED (A) NOT DETECTED   Methicillin resistance NOT DETECTED NOT DETECTED   Streptococcus species NOT DETECTED NOT DETECTED   Streptococcus agalactiae NOT DETECTED NOT DETECTED   Streptococcus pneumoniae NOT DETECTED NOT DETECTED   Streptococcus pyogenes NOT DETECTED NOT DETECTED   Acinetobacter baumannii NOT DETECTED NOT DETECTED   Enterobacteriaceae species NOT DETECTED NOT DETECTED   Enterobacter cloacae complex NOT DETECTED NOT DETECTED   Escherichia coli NOT DETECTED NOT DETECTED   Klebsiella oxytoca NOT DETECTED NOT DETECTED   Klebsiella pneumoniae NOT DETECTED NOT DETECTED   Proteus species NOT DETECTED NOT DETECTED   Serratia marcescens NOT DETECTED NOT DETECTED   Haemophilus influenzae NOT DETECTED NOT DETECTED   Neisseria meningitidis NOT DETECTED NOT DETECTED   Pseudomonas aeruginosa NOT DETECTED NOT DETECTED   Candida albicans NOT DETECTED NOT DETECTED   Candida glabrata NOT DETECTED NOT DETECTED   Candida krusei NOT DETECTED NOT DETECTED   Candida parapsilosis NOT DETECTED NOT DETECTED   Candida tropicalis NOT DETECTED NOT DETECTED    Tony Long 03/21/2018  12:20 AM

## 2018-03-21 NOTE — Progress Notes (Signed)
@IPLOG @        PROGRESS NOTE                                                                                                                                                                                                             Patient Demographics:    Tony Long, is a 42 y.o. male, DOB - 25-Oct-1975, ZOX:[id number]  Admit date - 03/20/2018   Admitting Physician Kendell Bane, MD  Outpatient Primary MD for the patient is [name], FNP  LOS - 1  CC Back pain  Brief Narrative  Tony Long is a 42 y.o. male   H/o insulin dependent DM2, HTN, h/o IVDU, reports has low back pain for the last 4 weeks, he was seen in the ED a week ago was sent home with pain meds and muscle relaxer, pain got worse, he started to use his son's subutex for the last week, pain continue to get worse and he starts to feel week in both his legs, he denies fever. no bowel and bladder incontinence, no saddle anesthesia,  He also reports neck pain. he returned to the [facility] ED today.   Subjective:    Damion Kant today has, No headache, No chest pain, No abdominal pain - No Nausea, No new weakness tingling or numbness, No Cough - SOB. +ve C and L spine pain.   Assessment  & Plan :    Active Problems:   Osteomyelitis (HCC)   1.  Lower back paraspinal abscess with a nonspecific fluid soft tissue collection on the right side of the neck.  General surgery, IR, ID along with neurosurgery have been consulted.  Plan is to take him to OR for incision and drainage of his lower back wound, continue empiric IV cefazolin, 2 out of 2 blood cultures from [place] are growing MSSA.  Transthoracic echocardiogram has been ordered if negative will require TEE as well.  Case discussed with Dr. Earle Gell ID.  2.  DKA in a patient with DM type II.  Continue DKA protocol, twice daily Lantus started, will monitor electrolytes and CBG control.  Lab Results  Component Value Date   HGBA1C 12.6 (A) 02/26/2018    CBG (last 3)  Recent Labs    03/20/18 1928 03/20/18 2045 03/20/18 2159  GLUCAP 231* 257* 258*     3.  MSSA bacteremia.  See #1 above.  4.  HX of smoking, intermittent alcohol use, cocaine, Suboxone abuse - told to quit all, on Suboxone withdrawal protocol, PRN oral narcotic for  breakthrough.  Will monitor.  5.  Essential hypertension.  For now clonidine will continue to monitor and adjust.    Family Communication  :  Parents bedside 03/21/18  Code Status :  Full  Disposition Plan  :  Tele  Consults  :  ID,N-Surg, CCS, IR  Procedures  :    TTE  MRI C-T-L Spine -  CT - 1. Evidence of cellulitis involving the right side of the base of the neck extending into the right supraclavicular region with fluid and gas in the soft tissues at the base of the right side of the neck. 2. No significant abnormality of the thoracic spine. 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  CT -  1. Extensive abnormal fluid collection in the subcutaneous fat of the midline of the back with underlying marked abnormality of the posterior paraspinal musculature from L1-2 through S3. This is worrisome for subcutaneous abscess and myositis. 2. Moth-eaten appearance of the spinous processes of L3 and L4 consistent with osteomyelitis. 3. No discrete epidural abscess. However, the abnormal edema in the paraspinal musculature extends to the posterior aspect of the spinal canal at L3-4. 4. MRI with and without contrast may better define the extent of the soft tissue and infection and could detect epidural extension that is not apparent on this unenhanced CT scan.    DVT Prophylaxis  :   Heparin    Lab Results  Component Value Date   PLT 388 03/20/2018    Diet :  Diet Order            Diet NPO time specified Except for: Sips with Meds  Diet effective midnight        Diet Carb Modified Fluid consistency: Thin; Room service appropriate? Yes  Diet effective now               Inpatient  Medications Scheduled Meds: . [START ON 03/22/2018] buprenorphine-naloxone  1 tablet Sublingual BID  . cloNIDine  0.1 mg Oral TID  . gabapentin  300 mg Oral TID  . insulin glargine  20 Units Subcutaneous BID  . LORazepam  1 mg Oral Once  . nicotine  21 mg Transdermal Daily   Continuous Infusions: . sodium chloride Stopped (03/20/18 1938)  . sodium chloride Stopped (03/20/18 2103)  .  ceFAZolin (ANCEF) IV    . dextrose 5 % and 0.45% NaCl Stopped (03/20/18 2240)  . insulin (NOVOLIN-R) infusion Stopped (03/20/18 2240)   PRN Meds:.buprenorphine-naloxone, hydrALAZINE, HYDROmorphone (DILAUDID) injection, LORazepam, oxyCODONE-acetaminophen  Antibiotics  :   Anti-infectives (From admission, onward)   Start     Dose/Rate Route Frequency Ordered Stop   03/21/18 0930  ceFAZolin (ANCEF) IVPB 2g/100 mL premix     2 g 200 mL/hr over 30 Minutes Intravenous Every 8 hours 03/21/18 0920            Objective:   Vitals:   03/20/18 1905 03/20/18 2100 03/20/18 2221 03/21/18 0602  BP:  (!) 164/95    Pulse: (!) 116     Resp: 15 19 (!) 21   Temp:  98 F (36.7 C)    TempSrc:  Oral    SpO2: 98% 99%    Weight:    68 kg  Height:    5\' 11"  (1.803 m)    Wt Readings from Last 3 Encounters:  03/21/18 68 kg  03/20/18 68 kg  03/07/18 70 kg    No intake or output data in the 24 hours ending 03/21/18 1142   Physical  Exam  Awake Alert, Oriented X 3, No new F.N deficits, Normal affect [place].AT,PERRAL Supple Neck,No JVD, No cervical lymphadenopathy appriciated.  Symmetrical Chest wall movement, Good air movement bilaterally, CTAB RRR,No Gallops,Rubs or new Murmurs, No Parasternal Heave +ve B.Sounds, Abd Soft, No tenderness, No organomegaly appriciated, No rebound - guarding or rigidity. No Cyanosis, Clubbing or edema, No new Rash or bruise , Large L.perilumbar cystic collection    Data Review:    CBC Recent Labs  Lab 03/20/18 0759  WBC 22.2*  HGB 10.5*  HCT 32.1*  PLT 388  MCV 95.6   MCH 31.3  MCHC 32.7  RDW 14.3  LYMPHSABS 0.8*  MONOABS 1.4*  EOSABS 0.0  BASOSABS 0.2*    Chemistries  Recent Labs  Lab 03/20/18 0759 03/20/18 1235  NA 138 140  K 3.7 3.8  CL 92* 97*  CO2 18* 17*  GLUCOSE 368* 367*  BUN 11 14  CREATININE 0.97 0.97  CALCIUM 9.2 9.2  MG 1.8  --   AST 13*  --   ALT 8  --   ALKPHOS 165*  --   BILITOT 2.3*  --    ------------------------------------------------------------------------------------------------------------------ No results for input(s): CHOL, HDL, LDLCALC, TRIG, CHOLHDL, LDLDIRECT in the last 72 hours.  Lab Results  Component Value Date   HGBA1C 12.6 (A) 02/26/2018   ------------------------------------------------------------------------------------------------------------------ No results for input(s): TSH, T4TOTAL, T3FREE, THYROIDAB in the last 72 hours.  Invalid input(s): FREET3 ------------------------------------------------------------------------------------------------------------------ No results for input(s): VITAMINB12, FOLATE, FERRITIN, TIBC, IRON, RETICCTPCT in the last 72 hours.  Coagulation profile Recent Labs  Lab 03/20/18 0759  INR 1.18    No results for input(s): DDIMER in the last 72 hours.  Cardiac Enzymes No results for input(s): CKMB, TROPONINI, MYOGLOBIN in the last 168 hours.  Invalid input(s): CK ------------------------------------------------------------------------------------------------------------------ No results found for: BNP  Micro Results Recent Results (from the past 240 hour(s))  Blood Culture (routine x 2)     Status: None (Preliminary result)   Collection Time: 03/20/18  7:59 AM  Result Value Ref Range Status   Specimen Description BLOOD EJ  Final   Special Requests   Final    BOTTLES DRAWN AEROBIC AND ANAEROBIC Blood Culture adequate volume   Culture  Setup Time   Final    GRAM POSITIVE COCCI IN BOTH AEROBIC AND ANAEROBIC BOTTLES CRITICAL RESULT CALLED TO, READ  BACK BY AND VERIFIED WITH: MATT MCBANE ON 03/21/18 AT 0001 QSD Performed at [facility], [address] [place], [place] [address]    Culture GRAM POSITIVE COCCI  Final   Report Status PENDING  Incomplete  Blood Culture (routine x 2)     Status: None (Preliminary result)   Collection Time: 03/20/18  7:59 AM  Result Value Ref Range Status   Specimen Description   Final    BLOOD EJ Performed at [name], [address] [place], [place] [address]    Special Requests   Final    BOTTLES DRAWN AEROBIC AND ANAEROBIC Blood Culture adequate volume Performed at [facility], [address] [place], [place] [address]    Culture  Setup Time   Final    GRAM POSITIVE COCCI IN BOTH AEROBIC AND ANAEROBIC BOTTLES CRITICAL RESULT CALLED TO, READ BACK BY AND VERIFIED WITH: MATT MCBANE ON 03/21/18 AT 0001 QSD Performed at [facility], [address] [place], [place] [address]    Culture [facility] POSITIVE COCCI  Final   Report Status PENDING  Incomplete  Blood Culture  ID Panel (Reflexed)     Status: Abnormal   Collection Time: 03/20/18  7:59 AM  Result Value Ref Range Status   Enterococcus species NOT DETECTED NOT DETECTED Final   Listeria monocytogenes NOT DETECTED NOT DETECTED Final   Staphylococcus species DETECTED (A) NOT DETECTED Final    Comment: CRITICAL RESULT CALLED TO, READ BACK BY AND VERIFIED WITH: MATT MCBANE ON 03/21/18 AT 0001 QSD    Staphylococcus aureus DETECTED (A) NOT DETECTED Final    Comment: Methicillin (oxacillin) susceptible Staphylococcus aureus (MSSA). Preferred therapy is anti staphylococcal beta lactam antibiotic (Cefazolin or Nafcillin), unless clinically contraindicated. CRITICAL RESULT CALLED TO, READ BACK BY AND VERIFIED WITH: MATT MCBANE ON 03/21/18 AT 0001 QSD    Methicillin resistance NOT DETECTED NOT DETECTED Final   Streptococcus species NOT DETECTED NOT DETECTED Final   Streptococcus agalactiae NOT DETECTED NOT DETECTED  Final   Streptococcus pneumoniae NOT DETECTED NOT DETECTED Final   Streptococcus pyogenes NOT DETECTED NOT DETECTED Final   Acinetobacter baumannii NOT DETECTED NOT DETECTED Final   Enterobacteriaceae species NOT DETECTED NOT DETECTED Final   Enterobacter cloacae complex NOT DETECTED NOT DETECTED Final   Escherichia coli NOT DETECTED NOT DETECTED Final   Klebsiella oxytoca NOT DETECTED NOT DETECTED Final   Klebsiella pneumoniae NOT DETECTED NOT DETECTED Final   Proteus species NOT DETECTED NOT DETECTED Final   Serratia marcescens NOT DETECTED NOT DETECTED Final   Haemophilus influenzae NOT DETECTED NOT DETECTED Final   Neisseria meningitidis NOT DETECTED NOT DETECTED Final   Pseudomonas aeruginosa NOT DETECTED NOT DETECTED Final   Candida albicans NOT DETECTED NOT DETECTED Final   Candida glabrata NOT DETECTED NOT DETECTED Final   Candida krusei NOT DETECTED NOT DETECTED Final   Candida parapsilosis NOT DETECTED NOT DETECTED Final   Candida tropicalis NOT DETECTED NOT DETECTED Final    Comment: Performed at [name], [address] [place], [place] [address]  MRSA PCR Screening     Status: None   Collection Time: 03/20/18  3:50 PM  Result Value Ref Range Status   MRSA by PCR NEGATIVE NEGATIVE Final    Comment:        The GeneXpert MRSA Assay (FDA approved for NASAL specimens only), is one component of a comprehensive MRSA colonization surveillance program. It is not intended to diagnose MRSA infection nor to guide or monitor treatment for MRSA infections. Performed at [facility], [address] [place], [place] [address]     Radiology Reports Ct Thoracic Spine Wo Contrast  Result Date: 03/20/2018 CLINICAL DATA:  Progressive back pain.  Swelling of the lower back. EXAM: CT THORACIC SPINE WITHOUT CONTRAST TECHNIQUE: Multidetector CT images of the thoracic were obtained using the standard protocol without intravenous contrast. COMPARISON:  None. FINDINGS:  Alignment: Normal. 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 Paraspinal and other soft tissues: There is abnormal gas and fluid in the soft tissues of the right side of the base of the neck and in the right supraclavicular region. Paraspinal soft tissues appear normal throughout the thoracic spine. Disc levels: There is no evidence of disc protrusion or significant disc bulging or spinal or foraminal stenosis or other significant abnormality of the thoracic spine. IMPRESSION: 1. Evidence of cellulitis involving the right side of the base of the neck extending into the right supraclavicular region with fluid and gas in the soft tissues at the base of the right side of the neck. 2. No significant abnormality of the thoracic spine. Congenital butterfly  vertebra at T12. Electronically Signed   By: Francene BoyersJames  Maxwell M.D.   On: 03/20/2018 11:25   Ct Lumbar Spine Wo Contrast  Addendum Date: 03/20/2018   ADDENDUM REPORT: 03/20/2018 11:44 ADDENDUM: Critical Value/emergent results were called by telephone at the time of interpretation on 03/20/2018 at 11:30 am to Dr. Sharyn CreamerMARK QUALE , who verbally acknowledged these results. Electronically Signed   By: Francene BoyersJames  Maxwell M.D.   On: 03/20/2018 11:44   Result Date: 03/20/2018 CLINICAL DATA:  Increasing low back pain and soft tissue swelling. EXAM: CT LUMBAR SPINE WITHOUT CONTRAST TECHNIQUE: Multidetector CT imaging of the lumbar spine was performed without intravenous contrast administration. Multiplanar CT image reconstructions were also generated. IV contrast could not be utilized due to the lack of an appropriate IV. COMPARISON:  None. FINDINGS: Segmentation: 5 lumbar type vertebrae. Alignment: Normal. Vertebrae: There is a moth-eaten appearance of the spinous processes of L3 and L4 which is worrisome for osteomyelitis. Bilateral pars defects at L5 with grade 1 spondylolisthesis. 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 Paraspinal and other soft tissues: There is an extensive  abnormal fluid collection in the subcutaneous soft tissues of the posterior aspect of the back extending from approximately L1-2 to S3. This fluid collection is lobulated and measures approximately 20 x 9 x 2.5 cm. It is centered slightly to the left of midline and has a mass effect upon the adjacent posterior paraspinal muscles. There is abnormal lucency in the underlying paraspinal muscles which could represent myositis. Disc levels: T11-12: No significant abnormality. Butterfly T12 vertebra. T12-L1: No significant abnormality. L1-2: Normal disc. Abnormal edema in the posterior paraspinal musculature with adjacent fluid collection in the subcutaneous fat of the posterior aspect of the back as described above. L2-3: Normal disc. L3-4: Normal disc. Lucency in the posterior paraspinal soft tissues extends to the posterior aspect of the thecal sac on image 80 of series 4 but there is no discrete epidural abscess. L4-5: Normal disc.  No evidence of epidural abscess. L5-S1: Grade 1 spondylolisthesis. No disc bulging or protrusion. Bilateral pars defects. No visible epidural abscess. IMPRESSION: 1. Extensive abnormal fluid collection in the subcutaneous fat of the midline of the back with underlying marked abnormality of the posterior paraspinal musculature from L1-2 through S3. This is worrisome for subcutaneous abscess and myositis. 2. Moth-eaten appearance of the spinous processes of L3 and L4 consistent with osteomyelitis. 3. No discrete epidural abscess. However, the abnormal edema in the paraspinal musculature extends to the posterior aspect of the spinal canal at L3-4. 4. MRI with and without contrast may better define the extent of the soft tissue and infection and could detect epidural extension that is not apparent on this unenhanced CT scan. Electronically Signed: By: Francene BoyersJames  Maxwell M.D. On: 03/20/2018 11:18   [place] Ekg Site Rite  Result Date: 03/21/2018 If Site Rite image not attached, placement could not be  confirmed due to current cardiac rhythm.   Time Spent in minutes  30   Susa RaringPrashant Singh M.D on 03/21/2018 at 11:42 AM  To page go to [email or web address] - password [email or web address]

## 2018-03-21 NOTE — Progress Notes (Signed)
Dr Thedore MinsSingh called this RN back, states he has spoken with DrComer, ID.  Dr Thedore MinsSingh states okay to place PICC with positive BC.  Also states okay to obtain any IV access, midline or PIV.

## 2018-03-21 NOTE — Consult Note (Addendum)
[facility]       Reason for Consult: MSSA bacteremia    Referring Physician: [facility] [employer]  Active Problems:   Osteomyelitis (HCC)   . [START ON 03/22/2018] buprenorphine-naloxone  1 tablet Sublingual BID  . gabapentin  300 mg Oral TID  . insulin glargine  20 Units Subcutaneous BID  . LORazepam  1 mg Oral Once  . nicotine  21 mg Transdermal Daily    Recommendations: Cefazolin PICC or any other access for difficult access person Repeat blood cultures TTE and TEE if negative   Assessment: He has abscess, myositis and L3-4 osteomyelitis and a positive blood culture for MSSA in 2/2.  Concern for endocarditis as well.  Will need prolonged IV antibiotics.   Poor access so I agree that a picc line, despite bacteremia, is indicated  Dr. Ninetta LightsHatcher back tomorrow  Antibiotics: Cefazolin Previous - vancomycin, cefepime, metronidazole  HPI: Tony Long is a 42 y.o. male with history of poorly controlled diabetes, IVDU, HTN and presented with 4 weeks of back pain.  CT scan noted as above and [facility] [place] [facility].  To get surgical debridement tomorrow by CCS.  Complains to me about not getting food.  No other complaints voiced.  No fever, WBC 22.2.  No access due to poor veins.  No associated rash.   Review of Systems:  Constitutional: negative for fevers and chills Gastrointestinal: negative for nausea and diarrhea Musculoskeletal: negative for myalgias and arthralgias All other systems reviewed and are negative    Past Medical History:  Diagnosis Date  . Diabetes mellitus without complication (HCC)     Social History   Tobacco Use  . Smoking status: Current Every Day Smoker    Packs/day: 1.00  . Smokeless tobacco: Never Used  Substance Use Topics  . Alcohol use: Not Currently    Comment: seldom  . Drug use: No    Comment: pt mother said hx of use    Family History  Problem Relation Age of Onset  . Heart  disease Father   . Diabetes Father   . Diabetes Paternal Uncle     No Known Allergies  Physical Exam: Constitutional: in no apparent distress and alert  Vitals:   03/20/18 2100 03/20/18 2221  BP: (!) 164/95   Pulse:    Resp: 19 (!) 21  Temp: 98 F (36.7 C)   SpO2: 99%    EYES: anicteric ENMT: no thrush Cardiovascular: Cor RRR Respiratory: CTA B; normal respiratory effort GI: Bowel sounds are normal, liver is not enlarged, spleen is not enlarged Musculoskeletal: no pedal edema noted Skin: negatives: no rash Hematologic: no cervical lad  Lab Results  Component Value Date   WBC 22.2 (H) 03/20/2018   HGB 10.5 (L) 03/20/2018   HCT 32.1 (L) 03/20/2018   MCV 95.6 03/20/2018   PLT 388 03/20/2018    Lab Results  Component Value Date   CREATININE 0.97 03/20/2018   BUN 14 03/20/2018   NA 140 03/20/2018   K 3.8 03/20/2018   CL 97 (L) 03/20/2018   CO2 17 (L) 03/20/2018    Lab Results  Component Value Date   ALT 8 03/20/2018   AST 13 (L) 03/20/2018   ALKPHOS 165 (H) 03/20/2018     Microbiology: Recent Results (from the past 240 hour(s))  Blood Culture (routine x 2)     Status: None (Preliminary result)   Collection Time: 03/20/18  7:59 AM  Result Value  Ref Range Status   Specimen Description BLOOD EJ  Final   Special Requests   Final    BOTTLES DRAWN AEROBIC AND ANAEROBIC Blood Culture adequate volume   Culture  Setup Time   Final    GRAM POSITIVE COCCI IN BOTH AEROBIC AND ANAEROBIC BOTTLES CRITICAL RESULT CALLED TO, READ BACK BY AND VERIFIED WITH: MATT MCBANE ON 03/21/18 AT 0001 QSD Performed at [facility], [address], [place], [place] [address]    Culture GRAM POSITIVE COCCI  Final   Report Status PENDING  Incomplete  Blood Culture (routine x 2)     Status: None (Preliminary result)   Collection Time: 03/20/18  7:59 AM  Result Value Ref Range Status   Specimen Description BLOOD EJ  Final   Special Requests   Final    BOTTLES DRAWN AEROBIC AND  ANAEROBIC Blood Culture adequate volume   Culture  Setup Time   Final    Organism ID to follow GRAM POSITIVE COCCI IN BOTH AEROBIC AND ANAEROBIC BOTTLES CRITICAL RESULT CALLED TO, READ BACK BY AND VERIFIED WITH: MATT MCBANE ON 03/21/18 AT 0001 QSD Performed at [facility], [address] [place], [place] [address]    Culture GRAM POSITIVE COCCI  Final   Report Status PENDING  Incomplete  Blood Culture ID Panel (Reflexed)     Status: Abnormal   Collection Time: 03/20/18  7:59 AM  Result Value Ref Range Status   Enterococcus species NOT DETECTED NOT DETECTED Final   Listeria monocytogenes NOT DETECTED NOT DETECTED Final   Staphylococcus species DETECTED (A) NOT DETECTED Final    Comment: CRITICAL RESULT CALLED TO, READ BACK BY AND VERIFIED WITH: MATT MCBANE ON 03/21/18 AT 0001 QSD    Staphylococcus aureus DETECTED (A) NOT DETECTED Final    Comment: Methicillin (oxacillin) susceptible Staphylococcus aureus (MSSA). Preferred therapy is anti staphylococcal beta lactam antibiotic (Cefazolin or Nafcillin), unless clinically contraindicated. CRITICAL RESULT CALLED TO, READ BACK BY AND VERIFIED WITH: MATT MCBANE ON 03/21/18 AT 0001 QSD    Methicillin resistance NOT DETECTED NOT DETECTED Final   Streptococcus species NOT DETECTED NOT DETECTED Final   Streptococcus agalactiae NOT DETECTED NOT DETECTED Final   Streptococcus pneumoniae NOT DETECTED NOT DETECTED Final   Streptococcus pyogenes NOT DETECTED NOT DETECTED Final   Acinetobacter baumannii NOT DETECTED NOT DETECTED Final   Enterobacteriaceae species NOT DETECTED NOT DETECTED Final   Enterobacter cloacae complex NOT DETECTED NOT DETECTED Final   Escherichia coli NOT DETECTED NOT DETECTED Final   Klebsiella oxytoca NOT DETECTED NOT DETECTED Final   Klebsiella pneumoniae NOT DETECTED NOT DETECTED Final   Proteus species NOT DETECTED NOT DETECTED Final   Serratia marcescens NOT DETECTED NOT DETECTED Final   Haemophilus  influenzae NOT DETECTED NOT DETECTED Final   Neisseria meningitidis NOT DETECTED NOT DETECTED Final   Pseudomonas aeruginosa NOT DETECTED NOT DETECTED Final   Candida albicans NOT DETECTED NOT DETECTED Final   Candida glabrata NOT DETECTED NOT DETECTED Final   Candida krusei NOT DETECTED NOT DETECTED Final   Candida parapsilosis NOT DETECTED NOT DETECTED Final   Candida tropicalis NOT DETECTED NOT DETECTED Final    Comment: Performed at [facility], [address] [place], [place] [address]  MRSA PCR Screening     Status: None   Collection Time: 03/20/18  3:50 PM  Result Value Ref Range Status   MRSA by PCR NEGATIVE NEGATIVE Final    Comment:        The GeneXpert  MRSA Assay (FDA approved for NASAL specimens only), is one component of a comprehensive MRSA colonization surveillance program. It is not intended to diagnose MRSA infection nor to guide or monitor treatment for MRSA infections. Performed at [name], [address], [place], [place] [address]     Gardiner Barefoot, MD [facility] [email or web address] [phone] pager  [phone] cell 03/21/2018, 11:31 AM

## 2018-03-21 NOTE — Progress Notes (Signed)
Midline placed per order.  Pt in agreement with Midline and need for IV antibiotic therapy to treat infection.  States is agreeable to not remove this line.  Understands PICC line may need to be placed for long term antibiotics in the future.  Pt requests for guaze wrap around the Midline to protect it.  Chip BoerVicki RN and Alycia Rossettiyan RN aware of placement.

## 2018-03-21 NOTE — Progress Notes (Signed)
03-21-18  0710  IV Team Note;   IV Team was called last evening -  before shift change -  For more  iv access;  Pt was seen and attempted by 2 IV Team RNs at that time without success;  Pt had an EJ at that time;  IV Team was called again - during the night, and 3 more IV Team RNs saw and attempted to get IV access for the patient, using ultrasound each time;  Pt with very poor veins.  Pt pulls lines out.  Charge Nurse has been made aware of multiple attempts by multiple IV Team Nurses.   Barkley BrunsLisa Sayla Golonka RN  IV Team

## 2018-03-21 NOTE — Progress Notes (Signed)
Patient seem to be refusing all medication therapies. Ativan 1 mg PO offered  but pt said "he didn't want it.". Pt also refused blood sugar check and don't want the EKG leads to be hooked back on or Vitals taken. Pt disconnected the insulin gtt at 2240  and  reported to staff that he don't want any medications. Pt Alert &  Oriented X 4 and education on  potential risks  Of medication noncompliance was provided. Patient verbalizes understanding of the risks

## 2018-03-21 NOTE — Consult Note (Signed)
[facility] Consult Note  Tony ClevelandBrandon Long 09/30/1975  [id number].    Requesting MD: Thedore MinsSingh Chief Complaint/Reason for Consult: back abscess HPI:  [name] a 42 year old male who presented to [facility] ED yesterday with back pain x4 weeks and back swelling x2 weeks. He was seen in the ED one week prior and sent home with muscle relaxants and pain medication. Since that time pain has gotten progressively worse and swelling has increased. He denies drainage from lesions or history of skin infections. He denies fever, chills, chest pain, SOB, abdominal pain, n/v, diarrhea, urinary symptoms, incontinence, numbness or tingling. He does report some LE weakness that he feels is worse on the left side. He denies drug allergies. He reports he drinks about 1 fifth of alcohol every 3 weeks, reports occasional chewing tobacco use, denies illicit drug use. PMH significant for T2DM controlled on insulin and HTN. Patient was seen by neurosurgeon who recommended drainage either by IR or general surgery to get information on organism.   ROS: Review of Systems  Constitutional: Negative for chills and fever.  Respiratory: Negative for shortness of breath and wheezing.   Cardiovascular: Negative for chest pain and palpitations.  Gastrointestinal: Negative for abdominal pain, constipation, diarrhea, nausea and vomiting.  Genitourinary: Negative for dysuria, frequency and urgency.  Musculoskeletal: Positive for back pain.  Neurological: Positive for focal weakness. Negative for dizziness, tingling, tremors and sensory change.  All other systems reviewed and are negative.   Family History  Problem Relation Age of Onset  . Heart disease Father   . Diabetes Father   . Diabetes Paternal Uncle     Past Medical History:  Diagnosis Date  . Diabetes mellitus without complication (HCC)     History reviewed. No pertinent surgical history.  Social History:  reports that he has been smoking. He has been  smoking about 1.00 pack per day. He has never used smokeless tobacco. He reports that he drank alcohol. He reports that he does not use drugs.  Allergies: No Known Allergies  Medications Prior to Admission  Medication Sig Dispense Refill  . gabapentin (NEURONTIN) 300 MG capsule Take 1 capsule (300 mg total) by mouth 3 (three) times daily. 90 capsule 6  . Insulin Glargine (LANTUS) 100 UNIT/ML Solostar Pen Inject 50 Units into the skin daily at 10 pm. (Patient taking differently: Inject 40 Units into the skin at bedtime. ) 45 mL 3  . insulin lispro (HUMALOG) 100 UNIT/ML injection Inject 0.1 mLs (10 Units total) into the skin 3 (three) times daily with meals. 60 mL 3  . cyclobenzaprine (FLEXERIL) 5 MG tablet Take 1-2 tablets (5-10 mg total) by mouth 3 (three) times daily as needed for muscle spasms. (Patient not taking: Reported on 03/20/2018) 20 tablet 0  . etodolac (LODINE) 500 MG tablet Take 1 tablet (500 mg total) by mouth 2 (two) times daily. (Patient not taking: Reported on 03/20/2018) 30 tablet 0  . glucose blood test strip Use as instructed 100 each 12  . HYDROcodone-acetaminophen (NORCO) 5-325 MG tablet Take 1 tablet by mouth every 6 (six) hours as needed for moderate pain. (Patient not taking: Reported on 03/20/2018) 15 tablet 0  . ibuprofen (ADVIL,MOTRIN) 600 MG tablet Take 1 tablet (600 mg total) by mouth every 8 (eight) hours as needed. (Patient not taking: Reported on 02/26/2018) 30 tablet 0  . Lancets (ONETOUCH ULTRASOFT) lancets Use as instructed 100 each 12  . LANTUS 100 UNIT/ML injection INJECT 40 UNITS TOTAL INTO THE SKIN AT  BEDTIME. (Patient not taking: Reported on 03/20/2018) 10 mL 0  . lisinopril (PRINIVIL,ZESTRIL) 5 MG tablet Take 1 tablet (5 mg total) by mouth daily. (Patient not taking: Reported on 02/26/2018) 90 tablet 3  . metFORMIN (GLUCOPHAGE) 500 MG tablet Take 1 tablet (500 mg total) by mouth 2 (two) times daily with a meal. (Patient not taking: Reported on 10/14/2017) 180  tablet 3  . pravastatin (PRAVACHOL) 40 MG tablet Take 1 tablet (40 mg total) by mouth daily. (Patient not taking: Reported on 12/11/2016) 90 tablet 3  . sildenafil (VIAGRA) 25 MG tablet Take 1 tablet (25 mg total) by mouth daily as needed for erectile dysfunction. (Patient not taking: Reported on 03/20/2018) 90 tablet 3    Blood pressure (!) 164/95, pulse (!) 116, temperature 98 F (36.7 C), temperature source Oral, resp. rate (!) 21, height 5\' 11"  (1.803 m), weight 68 kg, SpO2 99 %. Physical Exam: Physical Exam  Constitutional: He is oriented to person, place, and time. He appears well-developed and well-nourished. He is cooperative.  Non-toxic appearance. No distress.  HENT:  Head: Normocephalic and atraumatic.  Right Ear: External ear normal.  Left Ear: External ear normal.  Nose: Nose normal.  Mouth/Throat: Oropharynx is clear and moist and mucous membranes are normal.  Eyes: Pupils are equal, round, and reactive to light. Conjunctivae, EOM and lids are normal. No scleral icterus.  Neck: Phonation normal. No tracheal deviation present.  Cardiovascular: Regular rhythm. Tachycardia present.  Pulses:      Radial pulses are 2+ on the right side, and 2+ on the left side.       Dorsalis pedis pulses are 2+ on the right side, and 2+ on the left side.  Pulmonary/Chest: Effort normal and breath sounds normal.  Abdominal: Soft. Bowel sounds are normal. He exhibits no distension. There is no tenderness.  Musculoskeletal:  Moving all 4 extremities. ROM grossly intact in bilateral upper and lower extremities.  Neurological: He is alert and oriented to person, place, and time. He has normal strength. No sensory deficit. GCS eye subscore is 4. GCS verbal subscore is 5. GCS motor subscore is 6.  Skin: Skin is warm and dry.  Multiple tattoos. Patient with 3 areas of swelling and fluctuance on left lower back, not really erythematous or TTP, no drainage expressed.   Psychiatric: He has a normal mood and  affect. His speech is normal and behavior is normal.    Results for orders placed or performed during the hospital encounter of 03/20/18 (from the past 48 hour(s))  Glucose, capillary     Status: Abnormal   Collection Time: 03/20/18  3:41 PM  Result Value Ref Range   Glucose-Capillary 346 (H) 70 - 99 mg/dL  MRSA PCR Screening     Status: None   Collection Time: 03/20/18  3:50 PM  Result Value Ref Range   MRSA by PCR NEGATIVE NEGATIVE    Comment:        The GeneXpert MRSA Assay (FDA approved for NASAL specimens only), is one component of a comprehensive MRSA colonization surveillance program. It is not intended to diagnose MRSA infection nor to guide or monitor treatment for MRSA infections. Performed at [facility], [address] [place], [place] [address]   Urine rapid drug screen (hosp performed)     Status: Abnormal   Collection Time: 03/20/18  4:22 PM  Result Value Ref Range   Opiates POSITIVE (A) NONE DETECTED   Cocaine POSITIVE (A) NONE DETECTED   Benzodiazepines  NONE DETECTED NONE DETECTED   Amphetamines NONE DETECTED NONE DETECTED   Tetrahydrocannabinol NONE DETECTED NONE DETECTED   Barbiturates NONE DETECTED NONE DETECTED    Comment: (NOTE) DRUG SCREEN FOR MEDICAL PURPOSES ONLY.  IF CONFIRMATION IS NEEDED FOR ANY PURPOSE, NOTIFY LAB WITHIN 5 DAYS. LOWEST DETECTABLE LIMITS FOR URINE DRUG SCREEN Drug Class                     Cutoff (ng/mL) Amphetamine and metabolites    1000 Barbiturate and metabolites    200 Benzodiazepine                 200 Tricyclics and metabolites     300 Opiates and metabolites        300 Cocaine and metabolites        300 THC                            50 Performed at [facility], [address], [place], [place] [address]   Urinalysis, Routine w reflex microscopic     Status: Abnormal   Collection Time: 03/20/18  4:22 PM  Result Value Ref Range   Color, Urine YELLOW (A) YELLOW   APPearance CLEAR (A) CLEAR    Specific Gravity, Urine 1.015 1.005 - 1.030   pH 5.0 5.0 - 8.0   Glucose, UA >500 (A) NEGATIVE mg/dL   Hgb urine dipstick SMALL (A) NEGATIVE   Bilirubin Urine NEGATIVE NEGATIVE   Ketones, ur 80 (A) NEGATIVE mg/dL   Protein, ur NEGATIVE NEGATIVE mg/dL   Nitrite NEGATIVE NEGATIVE   Leukocytes, UA NEGATIVE NEGATIVE   RBC / HPF 0-5 0 - 5 RBC/hpf   WBC, UA 0-5 0 - 5 WBC/hpf   Bacteria, UA NONE SEEN NONE SEEN   Mucus PRESENT     Comment: Performed at [facility], [address], [place], [place] [address]  Glucose, capillary     Status: Abnormal   Collection Time: 03/20/18  4:48 PM  Result Value Ref Range   Glucose-Capillary 294 (H) 70 - 99 mg/dL  Glucose, capillary     Status: Abnormal   Collection Time: 03/20/18  6:27 PM  Result Value Ref Range   Glucose-Capillary 247 (H) 70 - 99 mg/dL  Glucose, capillary     Status: Abnormal   Collection Time: 03/20/18  7:28 PM  Result Value Ref Range   Glucose-Capillary 231 (H) 70 - 99 mg/dL  Glucose, capillary     Status: Abnormal   Collection Time: 03/20/18  8:45 PM  Result Value Ref Range   Glucose-Capillary 257 (H) 70 - 99 mg/dL  Glucose, capillary     Status: Abnormal   Collection Time: 03/20/18  9:59 PM  Result Value Ref Range   Glucose-Capillary 258 (H) 70 - 99 mg/dL   Ct Thoracic Spine Wo Contrast  Result Date: 03/20/2018 CLINICAL DATA:  Progressive back pain.  Swelling of the lower back. EXAM: CT THORACIC SPINE WITHOUT CONTRAST TECHNIQUE: Multidetector CT images of the thoracic were obtained using the standard protocol without intravenous contrast. COMPARISON:  None. FINDINGS: Alignment: Normal. 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 Paraspinal and other soft tissues: There is abnormal gas and fluid in the soft tissues of the right side of the base of the neck and in the right supraclavicular region. Paraspinal soft tissues appear normal throughout the thoracic spine. Disc levels: There is no evidence of disc protrusion or  significant disc bulging or spinal or foraminal stenosis or other significant abnormality of the thoracic spine. IMPRESSION: 1. Evidence of cellulitis involving the right side of the base of the neck extending into the right supraclavicular region with fluid and gas in the soft tissues at the base of the right side of the neck. 2. No significant abnormality of the thoracic spine. 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 Electronically Signed   By: Francene Boyers M.D.   On: 03/20/2018 11:25   Ct Lumbar Spine Wo Contrast  Addendum Date: 03/20/2018   ADDENDUM REPORT: 03/20/2018 11:44 ADDENDUM: Critical Value/emergent results were called by telephone at the time of interpretation on 03/20/2018 at 11:30 am to Dr. Sharyn Creamer , who verbally acknowledged these results. Electronically Signed   By: Francene Boyers M.D.   On: 03/20/2018 11:44   Result Date: 03/20/2018 CLINICAL DATA:  Increasing low back pain and soft tissue swelling. EXAM: CT LUMBAR SPINE WITHOUT CONTRAST TECHNIQUE: Multidetector CT imaging of the lumbar spine was performed without intravenous contrast administration. Multiplanar CT image reconstructions were also generated. IV contrast could not be utilized due to the lack of an appropriate IV. COMPARISON:  None. FINDINGS: Segmentation: 5 lumbar type vertebrae. Alignment: Normal. Vertebrae: There is a moth-eaten appearance of the spinous processes of L3 and L4 which is worrisome for osteomyelitis. Bilateral pars defects at L5 with grade 1 spondylolisthesis. 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 Paraspinal and other soft tissues: There is an extensive abnormal fluid collection in the subcutaneous soft tissues of the posterior aspect of the back extending from approximately L1-2 to S3. This fluid collection is lobulated and measures approximately 20 x 9 x 2.5 cm. It is centered slightly to the left of midline and has a mass effect upon the adjacent posterior paraspinal muscles. There is abnormal lucency in the  underlying paraspinal muscles which could represent myositis. Disc levels: T11-12: No significant abnormality. Butterfly T12 vertebra. T12-L1: No significant abnormality. L1-2: Normal disc. Abnormal edema in the posterior paraspinal musculature with adjacent fluid collection in the subcutaneous fat of the posterior aspect of the back as described above. L2-3: Normal disc. L3-4: Normal disc. Lucency in the posterior paraspinal soft tissues extends to the posterior aspect of the thecal sac on image 80 of series 4 but there is no discrete epidural abscess. L4-5: Normal disc.  No evidence of epidural abscess. L5-S1: Grade 1 spondylolisthesis. No disc bulging or protrusion. Bilateral pars defects. No visible epidural abscess. IMPRESSION: 1. Extensive abnormal fluid collection in the subcutaneous fat of the midline of the back with underlying marked abnormality of the posterior paraspinal musculature from L1-2 through S3. This is worrisome for subcutaneous abscess and myositis. 2. Moth-eaten appearance of the spinous processes of L3 and L4 consistent with osteomyelitis. 3. No discrete epidural abscess. However, the abnormal edema in the paraspinal musculature extends to the posterior aspect of the spinal canal at L3-4. 4. MRI with and without contrast may better define the extent of the soft tissue and infection and could detect epidural extension that is not apparent on this unenhanced CT scan. Electronically Signed: By: Francene Boyers M.D. On: 03/20/2018 11:18   [place] Ekg Site Rite  Result Date: 03/21/2018 If Site Rite image not attached, placement could not be confirmed due to current cardiac rhythm.     Assessment/Plan T2DM on insulin - A1c 02/26/18 12.6, blood glucose in the 200s  HTN Hx of IVDA - positive for cocaine and opiates on UDS yesterday, hepatitis and HIV pending  MSSA  bacteremia - abx per primary team Lumbar abscesses - WBC 22 on admission, repeat labs pending - CT: loculated fluid collection  20 x9 x2.5 cm in soft tissues from L1-2 to S3, concern for possible myositis; no discrete epidural abscess, concern for osteomyelitis at L3-4 - MRI pending - recommend I&D of back abscesses potentially tomorrow in the OR   FEN: CM diet, NPO after MN  VTE: SCDs ID: [id number] 9/21>>  Wells Guiles, [facility] 03/21/2018, 9:07 AM Pager: [phone] Consults: [phone] Mon-Fri 7:00 am-4:30 pm Sat-Sun 7:00 am-11:30 am

## 2018-03-21 NOTE — Progress Notes (Signed)
Spoke with Dr Thedore MinsSingh re PICC order.  Positive blood cultures noted. Dr Thedore MinsSingh states he will speak with ID. Aware that currently has no PIV access.

## 2018-03-21 NOTE — Progress Notes (Addendum)
Pharmacy Antibiotic Note  Tony Long is a 42 y.o. male admitted to [facility] on 03/20/2018, patient has [facility] to [facility]. Pharmacy consulted for cefazolin dosing for bacteremia.  Patient past medical history significant for DM2, HTN, h/o IVDU, c/o lower back pain last 4 weeks.  Patient received one dose of cefepime, vancomycin and metronidazole in the ED at [facility].   Plan: Start Cefazolin 2 gm IV every 8 hours Follow-up signs/symptoms of infection, clinical improvement, C&S report, renal function.  Height: 5\' 11"  (180.3 cm) Weight: 150 lb (68 kg) IBW/kg (Calculated) : 75.3  Temp (24hrs), Avg:98 F (36.7 C), Min:98 F (36.7 C), Max:98 F (36.7 C)  Recent Labs  Lab 03/20/18 0759 03/20/18 1235  WBC 22.2*  --   CREATININE 0.97 0.97  LATICACIDVEN 2.0* 1.4    Estimated Creatinine Clearance: 96.4 mL/min (by C-G formula based on SCr of 0.97 mg/dL).    No Known Allergies  Antimicrobials this admission: 9/21 cefepime x1 9/21 vancomycin x1 9/21 metronidazole x1 9/22 Cefazolin >>  Dose adjustments this admission: n/a  Microbiology results: 9/22 BCx: prelim - GRAM POSITIVE COCCI (9/22 - 0149) 9/21 BCID: Staph aureus, MRSA not detected. 9/21 MRSA PCR: negative  Thank you for allowing pharmacy to be a part of this patient's care.  Bradley FerrisJoshua Tahirih Lair, PharmD 03/21/2018 9:19 AM PGY-1 Pharmacy Resident Direct Phone 9/22: [phone] Please check [email or web address] for unit-specific pharmacist phone numbers

## 2018-03-22 ENCOUNTER — Other Ambulatory Visit (HOSPITAL_COMMUNITY): Payer: Medicaid Other

## 2018-03-22 ENCOUNTER — Inpatient Hospital Stay (HOSPITAL_COMMUNITY): Payer: Medicaid Other | Admitting: Certified Registered Nurse Anesthetist

## 2018-03-22 ENCOUNTER — Encounter (HOSPITAL_COMMUNITY): Payer: Self-pay | Admitting: Certified Registered Nurse Anesthetist

## 2018-03-22 ENCOUNTER — Inpatient Hospital Stay (HOSPITAL_COMMUNITY): Payer: Medicaid Other

## 2018-03-22 ENCOUNTER — Encounter (HOSPITAL_COMMUNITY)
Admission: AD | Disposition: A | Discharge status: Home / Self Care | Payer: Self-pay | Source: Other Acute Inpatient Hospital | Attending: Internal Medicine

## 2018-03-22 DIAGNOSIS — I361 Nonrheumatic tricuspid (valve) insufficiency: Secondary | ICD-10-CM

## 2018-03-22 HISTORY — PX: INCISION AND DRAINAGE ABSCESS: SHX5864

## 2018-03-22 HISTORY — PX: TEE WITHOUT CARDIOVERSION: SHX5443

## 2018-03-22 LAB — COMPREHENSIVE METABOLIC PANEL
ALBUMIN: 2 g/dL — AB (ref 3.5–5.0)
ALT: 8 U/L (ref 0–44)
AST: 9 U/L — AB (ref 15–41)
Alkaline Phosphatase: 154 U/L — ABNORMAL HIGH (ref 38–126)
Anion gap: 13 (ref 5–15)
BILIRUBIN TOTAL: 0.8 mg/dL (ref 0.3–1.2)
BUN: 9 mg/dL (ref 6–20)
CHLORIDE: 107 mmol/L (ref 98–111)
CO2: 21 mmol/L — ABNORMAL LOW (ref 22–32)
CREATININE: 0.81 mg/dL (ref 0.61–1.24)
Calcium: 9.4 mg/dL (ref 8.9–10.3)
GFR calc Af Amer: >60 mL/min (ref >60)
GFR calc non Af Amer: >60 mL/min (ref >60)
GLUCOSE: 127 mg/dL — AB (ref 70–99)
POTASSIUM: 2.9 mmol/L — AB (ref 3.5–5.1)
Sodium: 141 mmol/L (ref 135–145)
Total Protein: 7 g/dL (ref 6.5–8.1)

## 2018-03-22 LAB — CBC
HEMATOCRIT: 35.3 % — AB (ref 39.0–52.0)
Hemoglobin: 11.2 g/dL — ABNORMAL LOW (ref 13.0–17.0)
MCH: 30.8 pg (ref 26.0–34.0)
MCHC: 31.7 g/dL (ref 30.0–36.0)
MCV: 97 fL (ref 78.0–100.0)
PLATELETS: 386 10*3/uL (ref 150–400)
RBC: 3.64 MIL/uL — AB (ref 4.22–5.81)
RDW: 12.6 % (ref 11.5–15.5)
WBC: 25.3 10*3/uL — AB (ref 4.0–10.5)

## 2018-03-22 LAB — PROCALCITONIN: Procalcitonin: 1.47 ng/mL

## 2018-03-22 LAB — GLUCOSE, CAPILLARY
GLUCOSE-CAPILLARY: 103 mg/dL — AB (ref 70–99)
GLUCOSE-CAPILLARY: 91 mg/dL (ref 70–99)
GLUCOSE-CAPILLARY: 157 mg/dL — AB (ref 70–99)
GLUCOSE-CAPILLARY: 184 mg/dL — AB (ref 70–99)
Glucose-Capillary: 105 mg/dL — ABNORMAL HIGH (ref 70–99)
Glucose-Capillary: 123 mg/dL — ABNORMAL HIGH (ref 70–99)
Glucose-Capillary: 123 mg/dL — ABNORMAL HIGH (ref 70–99)
Glucose-Capillary: 132 mg/dL — ABNORMAL HIGH (ref 70–99)
Glucose-Capillary: 90 mg/dL (ref 70–99)

## 2018-03-22 LAB — MAGNESIUM: Magnesium: 1.7 mg/dL (ref 1.7–2.4)

## 2018-03-22 SURGERY — INCISION AND DRAINAGE, ABSCESS
Anesthesia: General

## 2018-03-22 SURGERY — MRI WITH ANESTHESIA
Anesthesia: Choice

## 2018-03-22 MED ORDER — LACTATED RINGERS IV SOLN
INTRAVENOUS | Status: DC
  Administered 2018-03-22 (×2): via INTRAVENOUS

## 2018-03-22 MED ORDER — LIDOCAINE 2% (20 MG/ML) 5 ML SYRINGE
INTRAMUSCULAR | Status: AC
  Filled 2018-03-22: qty 5

## 2018-03-22 MED ORDER — FENTANYL CITRATE (PF) 250 MCG/5ML IJ SOLN
INTRAMUSCULAR | Status: AC
  Filled 2018-03-22: qty 5

## 2018-03-22 MED ORDER — ROCURONIUM BROMIDE 50 MG/5ML IV SOSY
PREFILLED_SYRINGE | INTRAVENOUS | Status: AC
  Filled 2018-03-22: qty 20

## 2018-03-22 MED ORDER — DEXAMETHASONE SODIUM PHOSPHATE 10 MG/ML IJ SOLN
INTRAMUSCULAR | Status: AC
  Filled 2018-03-22: qty 1

## 2018-03-22 MED ORDER — INSULIN ASPART 100 UNIT/ML ~~LOC~~ SOLN
0.0000 [IU] | SUBCUTANEOUS | Status: DC
  Administered 2018-03-22 (×2): 4 [IU] via SUBCUTANEOUS

## 2018-03-22 MED ORDER — LACTATED RINGERS IV SOLN
INTRAVENOUS | Status: DC | PRN
  Administered 2018-03-22: 10:00:00 via INTRAVENOUS

## 2018-03-22 MED ORDER — MEPERIDINE HCL 50 MG/ML IJ SOLN: 6.2500 mg | INTRAMUSCULAR | Status: DC | PRN

## 2018-03-22 MED ORDER — MAGNESIUM SULFATE IN D5W 1-5 GM/100ML-% IV SOLN
1.0000 g | Freq: Once | INTRAVENOUS | Status: DC
  Filled 2018-03-22: qty 100

## 2018-03-22 MED ORDER — SUGAMMADEX SODIUM 200 MG/2ML IV SOLN
INTRAVENOUS | Status: DC | PRN
  Administered 2018-03-22: 140 mg via INTRAVENOUS

## 2018-03-22 MED ORDER — CLONIDINE HCL 0.2 MG PO TABS
0.2000 mg | ORAL_TABLET | Freq: Three times a day (TID) | ORAL | Status: DC
  Administered 2018-03-22 – 2018-03-24 (×7): 0.2 mg via ORAL
  Filled 2018-03-22 (×7): qty 1

## 2018-03-22 MED ORDER — PROMETHAZINE HCL 25 MG/ML IJ SOLN: 6.2500 mg | INTRAMUSCULAR | Status: DC | PRN

## 2018-03-22 MED ORDER — DEXAMETHASONE SODIUM PHOSPHATE 10 MG/ML IJ SOLN
INTRAMUSCULAR | Status: DC | PRN
  Administered 2018-03-22: 10 mg via INTRAVENOUS

## 2018-03-22 MED ORDER — BUPRENORPHINE HCL-NALOXONE HCL 2-0.5 MG SL SUBL: 1.0000 | SUBLINGUAL_TABLET | SUBLINGUAL | Status: DC | PRN

## 2018-03-22 MED ORDER — SODIUM CHLORIDE 0.9 % IV SOLN
INTRAVENOUS | Status: AC
  Administered 2018-03-22: 15:00:00 via INTRAVENOUS
  Filled 2018-03-22 (×3): qty 1000

## 2018-03-22 MED ORDER — PROPOFOL 500 MG/50ML IV EMUL
INTRAVENOUS | Status: DC | PRN
  Administered 2018-03-22: 25 ug/kg/min via INTRAVENOUS

## 2018-03-22 MED ORDER — POTASSIUM CHLORIDE CRYS ER 20 MEQ PO TBCR
40.0000 meq | EXTENDED_RELEASE_TABLET | Freq: Once | ORAL | Status: DC
  Filled 2018-03-22: qty 2

## 2018-03-22 MED ORDER — PROPOFOL 10 MG/ML IV BOLUS
INTRAVENOUS | Status: AC
  Filled 2018-03-22: qty 20

## 2018-03-22 MED ORDER — PHENYLEPHRINE 40 MCG/ML (10ML) SYRINGE FOR IV PUSH (FOR BLOOD PRESSURE SUPPORT)
PREFILLED_SYRINGE | INTRAVENOUS | Status: DC | PRN
  Administered 2018-03-22 (×3): 80 ug via INTRAVENOUS

## 2018-03-22 MED ORDER — SODIUM CHLORIDE 0.9 % IV SOLN: INTRAVENOUS | Status: DC

## 2018-03-22 MED ORDER — METOPROLOL TARTRATE 5 MG/5ML IV SOLN
5.0000 mg | Freq: Once | INTRAVENOUS | Status: AC
  Administered 2018-03-22: 5 mg via INTRAVENOUS
  Filled 2018-03-22: qty 5

## 2018-03-22 MED ORDER — INSULIN DETEMIR 100 UNIT/ML ~~LOC~~ SOLN
5.0000 [IU] | Freq: Once | SUBCUTANEOUS | Status: AC
  Administered 2018-03-22: 5 [IU] via SUBCUTANEOUS
  Filled 2018-03-22: qty 0.05

## 2018-03-22 MED ORDER — HYDROMORPHONE HCL 1 MG/ML IJ SOLN: 0.2500 mg | INTRAMUSCULAR | Status: DC | PRN

## 2018-03-22 MED ORDER — LIDOCAINE 2% (20 MG/ML) 5 ML SYRINGE
INTRAMUSCULAR | Status: DC | PRN
  Administered 2018-03-22: 20 mg via INTRAVENOUS

## 2018-03-22 MED ORDER — FENTANYL CITRATE (PF) 100 MCG/2ML IJ SOLN
INTRAMUSCULAR | Status: DC | PRN
  Administered 2018-03-22: 100 ug via INTRAVENOUS

## 2018-03-22 MED ORDER — MIDAZOLAM HCL 2 MG/2ML IJ SOLN: 0.5000 mg | Freq: Once | INTRAMUSCULAR | Status: DC | PRN

## 2018-03-22 MED ORDER — PROPOFOL 10 MG/ML IV BOLUS
INTRAVENOUS | Status: DC | PRN
  Administered 2018-03-22: 10 mg via INTRAVENOUS
  Administered 2018-03-22: 30 mg via INTRAVENOUS

## 2018-03-22 MED ORDER — ONDANSETRON HCL 4 MG/2ML IJ SOLN
INTRAMUSCULAR | Status: DC | PRN
  Administered 2018-03-22: 4 mg via INTRAVENOUS

## 2018-03-22 MED ORDER — SODIUM CHLORIDE 0.9 % IV SOLN
INTRAVENOUS | Status: DC | PRN
  Administered 2018-03-22: 25 ug/min via INTRAVENOUS

## 2018-03-22 MED ORDER — ONDANSETRON HCL 4 MG/2ML IJ SOLN
INTRAMUSCULAR | Status: AC
  Filled 2018-03-22: qty 2

## 2018-03-22 MED ORDER — MIDAZOLAM HCL 2 MG/2ML IJ SOLN
INTRAMUSCULAR | Status: AC
  Filled 2018-03-22: qty 2

## 2018-03-22 MED ORDER — ARTIFICIAL TEARS OPHTHALMIC OINT
TOPICAL_OINTMENT | OPHTHALMIC | Status: DC | PRN
  Administered 2018-03-22: 1 via OPHTHALMIC

## 2018-03-22 MED ORDER — GADOBUTROL 1 MMOL/ML IV SOLN
7.5000 mL | Freq: Once | INTRAVENOUS | Status: AC | PRN
  Administered 2018-03-22: 6 mL via INTRAVENOUS

## 2018-03-22 MED ORDER — ROCURONIUM BROMIDE 10 MG/ML (PF) SYRINGE
PREFILLED_SYRINGE | INTRAVENOUS | Status: DC | PRN
  Administered 2018-03-22 (×2): 50 mg via INTRAVENOUS

## 2018-03-22 SURGICAL SUPPLY — 27 items
BNDG GAUZE ELAST 4 BULKY (GAUZE/BANDAGES/DRESSINGS) IMPLANT
CANISTER SUCT 3000ML PPV (MISCELLANEOUS) ×4 IMPLANT
COVER SURGICAL LIGHT HANDLE (MISCELLANEOUS) ×4 IMPLANT
DRAPE LAPAROSCOPIC ABDOMINAL (DRAPES) ×4 IMPLANT
DRAPE UTILITY XL STRL (DRAPES) ×4 IMPLANT
DRSG PAD ABDOMINAL 8X10 ST (GAUZE/BANDAGES/DRESSINGS) ×4 IMPLANT
ELECT CAUTERY BLADE 6.4 (BLADE) ×4 IMPLANT
ELECT REM PT RETURN 9FT ADLT (ELECTROSURGICAL) ×4
ELECTRODE REM PT RTRN 9FT ADLT (ELECTROSURGICAL) ×2 IMPLANT
GAUZE SPONGE 4X4 12PLY STRL (GAUZE/BANDAGES/DRESSINGS) ×4 IMPLANT
GLOVE BIO SURGEON STRL SZ8 (GLOVE) ×4 IMPLANT
GLOVE BIOGEL PI IND STRL 8 (GLOVE) ×2 IMPLANT
GLOVE BIOGEL PI INDICATOR 8 (GLOVE) ×2
GOWN STRL REUS W/ TWL LRG LVL3 (GOWN DISPOSABLE) ×2 IMPLANT
GOWN STRL REUS W/ TWL XL LVL3 (GOWN DISPOSABLE) ×2 IMPLANT
GOWN STRL REUS W/TWL LRG LVL3 (GOWN DISPOSABLE) ×2
GOWN STRL REUS W/TWL XL LVL3 (GOWN DISPOSABLE) ×2
KIT BASIN OR (CUSTOM PROCEDURE TRAY) ×4 IMPLANT
KIT TURNOVER KIT B (KITS) ×4 IMPLANT
NS IRRIG 1000ML POUR BTL (IV SOLUTION) ×4 IMPLANT
PACK GENERAL/GYN (CUSTOM PROCEDURE TRAY) ×4 IMPLANT
PAD ABD 8X10 STRL (GAUZE/BANDAGES/DRESSINGS) ×4 IMPLANT
PAD ARMBOARD 7.5X6 YLW CONV (MISCELLANEOUS) ×4 IMPLANT
SWAB COLLECTION DEVICE MRSA (MISCELLANEOUS) ×4 IMPLANT
SWAB CULTURE ESWAB REG 1ML (MISCELLANEOUS) IMPLANT
TOWEL OR 17X24 6PK STRL BLUE (TOWEL DISPOSABLE) ×4 IMPLANT
TOWEL OR 17X26 10 PK STRL BLUE (TOWEL DISPOSABLE) ×4 IMPLANT

## 2018-03-22 NOTE — Progress Notes (Signed)
Subjective: Patient reports (in OR)  Objective: Vital signs in last 24 hours: Temp:  [97.6 F (36.4 C)-101.6 F (38.7 C)] 100.3 F (37.9 C) (09/23 1455) Pulse Rate:  [119-129] 123 (09/23 1455) Resp:  [15-26] 19 (09/23 1455) BP: (111-169)/(58-114) 159/84 (09/23 1455) SpO2:  [94 %-96 %] 95 % (09/23 1455)  Intake/Output from previous day: 09/22 0701 - 09/23 0700 In: 1131.5 [P.O.:450; I.V.:381.5; IV Piggyback:300] Out: -  Intake/Output this shift: Total I/O In: 500 [I.V.:500] Out: -   Physical Exam: In OR  Lab Results: Recent Labs    03/21/18 1337 03/22/18 0339  WBC 26.3* 25.3*  HGB 10.3* 11.2*  HCT 34.8* 35.3*  PLT 374 386   BMET Recent Labs    03/21/18 2228 03/22/18 0339  NA 136 141  K 2.8* 2.9*  CL 106 107  CO2 18* 21*  GLUCOSE 143* 127*  BUN 11 9  CREATININE 0.93 0.81  CALCIUM 9.3 9.4    Studies/Results: Mr Cervical Spine W Wo Contrast  Result Date: 03/22/2018 CLINICAL DATA:  Spine infection. EXAM: MRI TOTAL SPINE WITHOUT AND WITH CONTRAST TECHNIQUE: Multisequence MR imaging of the spine from the cervical spine to the sacrum was performed prior to and following IV contrast administration. CONTRAST:  6 cc Gadavist intravenous COMPARISON:  CT of the thoracic and lumbar spine from 2 days ago FINDINGS: MRI CERVICAL SPINE FINDINGS Alignment: Normal Vertebrae: No evidence of osseous infection. Canal/Cord: There is extensive spinal fluid collection preferentially in the ventral but also in the right more than left dorsal canal. Subarachnoid space is diffusely effaced. Maximal thickness is posterior to C2 at 9 mm. No cord signal abnormality. Posterior Fossa, vertebral arteries, paraspinal tissues: No retropharyngeal or other discrete soft tissue collection. Disc levels: C2-3: Unremarkable. C3-4: Small left foraminal protrusion with moderate narrowing C4-5: Unremarkable. C5-6: Disc narrowing and bulging with asymmetric left uncovertebral spurring. Left foraminal  impingement C6-7: Right foraminal protrusion mild narrowing. C7-T1:Unremarkable. MRI THORACIC SPINE FINDINGS Alignment:  Normal Vertebrae: No evidence of osteomyelitis or discitis. T12 butterfly vertebra. Canal/Cord: Cervical ventral epidural collection continues throughout the thoracic levels. There is also a focal dorsal component at T3-4 to T5-6, where thecal sac effacement is accentuated and there is cord flattening. No cord edema. Paraspinal and other soft tissues: Paraspinous phlegmon on the left at T8-T12, with new complex left pleural effusion and lower lobe atelectasis Disc levels: T5-6 central disc protrusion. MRI LUMBAR SPINE FINDINGS Segmentation:  5 lumbar type vertebral bodies Alignment:  Grade 1 anterolisthesis at L5-S1. Vertebrae: Marrow edema and heterogeneous enhancement within the L2, L3, and L4 spinous processes. No discitis or facet edema. Conus medullaris: Extends to the L1 level and is non edematous. There is extensive epidural collection completely effacing the thecal sac throughout the lumbar spine until L4-5 and below where the collection becomes ventral and right eccentric. The infection communicates with extensive bilateral abscess within the intrinsic back muscles via the interspinous space at L3-4. Patient had recent subcutaneous collection and left buttocks collection drainage, with packing seen in place. This midline, upper subcutaneous collection communicates with the paravertebral abscess along its superior margin based on postcontrast axial images. Paraspinal and other soft tissues: As above.  Distended bladder Disc levels: Chronic bilateral pars defects at L5. Critical Value/emergent results were called by telephone at the time of interpretation on 03/22/2018 at 3:04 pm to Dr. Thedore Mins , who verbally acknowledged these results. IMPRESSION: 1. Epidural abscess from C2 to sacrum as described. Maximal cord compression from T3-4 to T5-6. The  thecal sac is completely effaced from L1 to  L4-5. At the L3-4 interspinous space the spinal abscess communicates with large bilateral abscesses within the intrinsic back muscles. There is osteomyelitis of the L2, L3, and L4 spinous processes. No discitis or facet arthritis. 2. Left paravertebral abscess along the lower thoracic spine with small left empyema that is new from CT 2 days ago. 3. Distended bladder Electronically Signed   By: Marnee Spring M.D.   On: 03/22/2018 15:11   Mr Thoracic Spine W Wo Contrast  Result Date: 03/22/2018 CLINICAL DATA:  Spine infection. EXAM: MRI TOTAL SPINE WITHOUT AND WITH CONTRAST TECHNIQUE: Multisequence MR imaging of the spine from the cervical spine to the sacrum was performed prior to and following IV contrast administration. CONTRAST:  6 cc Gadavist intravenous COMPARISON:  CT of the thoracic and lumbar spine from 2 days ago FINDINGS: MRI CERVICAL SPINE FINDINGS Alignment: Normal Vertebrae: No evidence of osseous infection. Canal/Cord: There is extensive spinal fluid collection preferentially in the ventral but also in the right more than left dorsal canal. Subarachnoid space is diffusely effaced. Maximal thickness is posterior to C2 at 9 mm. No cord signal abnormality. Posterior Fossa, vertebral arteries, paraspinal tissues: No retropharyngeal or other discrete soft tissue collection. Disc levels: C2-3: Unremarkable. C3-4: Small left foraminal protrusion with moderate narrowing C4-5: Unremarkable. C5-6: Disc narrowing and bulging with asymmetric left uncovertebral spurring. Left foraminal impingement C6-7: Right foraminal protrusion mild narrowing. C7-T1:Unremarkable. MRI THORACIC SPINE FINDINGS Alignment:  Normal Vertebrae: No evidence of osteomyelitis or discitis. T12 butterfly vertebra. Canal/Cord: Cervical ventral epidural collection continues throughout the thoracic levels. There is also a focal dorsal component at T3-4 to T5-6, where thecal sac effacement is accentuated and there is cord flattening. No cord  edema. Paraspinal and other soft tissues: Paraspinous phlegmon on the left at T8-T12, with new complex left pleural effusion and lower lobe atelectasis Disc levels: T5-6 central disc protrusion. MRI LUMBAR SPINE FINDINGS Segmentation:  5 lumbar type vertebral bodies Alignment:  Grade 1 anterolisthesis at L5-S1. Vertebrae: Marrow edema and heterogeneous enhancement within the L2, L3, and L4 spinous processes. No discitis or facet edema. Conus medullaris: Extends to the L1 level and is non edematous. There is extensive epidural collection completely effacing the thecal sac throughout the lumbar spine until L4-5 and below where the collection becomes ventral and right eccentric. The infection communicates with extensive bilateral abscess within the intrinsic back muscles via the interspinous space at L3-4. Patient had recent subcutaneous collection and left buttocks collection drainage, with packing seen in place. This midline, upper subcutaneous collection communicates with the paravertebral abscess along its superior margin based on postcontrast axial images. Paraspinal and other soft tissues: As above.  Distended bladder Disc levels: Chronic bilateral pars defects at L5. Critical Value/emergent results were called by telephone at the time of interpretation on 03/22/2018 at 3:04 pm to Dr. Thedore Mins , who verbally acknowledged these results. IMPRESSION: 1. Epidural abscess from C2 to sacrum as described. Maximal cord compression from T3-4 to T5-6. The thecal sac is completely effaced from L1 to L4-5. At the L3-4 interspinous space the spinal abscess communicates with large bilateral abscesses within the intrinsic back muscles. There is osteomyelitis of the L2, L3, and L4 spinous processes. No discitis or facet arthritis. 2. Left paravertebral abscess along the lower thoracic spine with small left empyema that is new from CT 2 days ago. 3. Distended bladder Electronically Signed   By: Marnee Spring M.D.   On: 03/22/2018  15:11  Mr Lumbar Spine W Wo Contrast  Result Date: 03/22/2018 CLINICAL DATA:  Spine infection. EXAM: MRI TOTAL SPINE WITHOUT AND WITH CONTRAST TECHNIQUE: Multisequence MR imaging of the spine from the cervical spine to the sacrum was performed prior to and following IV contrast administration. CONTRAST:  6 cc Gadavist intravenous COMPARISON:  CT of the thoracic and lumbar spine from 2 days ago FINDINGS: MRI CERVICAL SPINE FINDINGS Alignment: Normal Vertebrae: No evidence of osseous infection. Canal/Cord: There is extensive spinal fluid collection preferentially in the ventral but also in the right more than left dorsal canal. Subarachnoid space is diffusely effaced. Maximal thickness is posterior to C2 at 9 mm. No cord signal abnormality. Posterior Fossa, vertebral arteries, paraspinal tissues: No retropharyngeal or other discrete soft tissue collection. Disc levels: C2-3: Unremarkable. C3-4: Small left foraminal protrusion with moderate narrowing C4-5: Unremarkable. C5-6: Disc narrowing and bulging with asymmetric left uncovertebral spurring. Left foraminal impingement C6-7: Right foraminal protrusion mild narrowing. C7-T1:Unremarkable. MRI THORACIC SPINE FINDINGS Alignment:  Normal Vertebrae: No evidence of osteomyelitis or discitis. T12 butterfly vertebra. Canal/Cord: Cervical ventral epidural collection continues throughout the thoracic levels. There is also a focal dorsal component at T3-4 to T5-6, where thecal sac effacement is accentuated and there is cord flattening. No cord edema. Paraspinal and other soft tissues: Paraspinous phlegmon on the left at T8-T12, with new complex left pleural effusion and lower lobe atelectasis Disc levels: T5-6 central disc protrusion. MRI LUMBAR SPINE FINDINGS Segmentation:  5 lumbar type vertebral bodies Alignment:  Grade 1 anterolisthesis at L5-S1. Vertebrae: Marrow edema and heterogeneous enhancement within the L2, L3, and L4 spinous processes. No discitis or facet  edema. Conus medullaris: Extends to the L1 level and is non edematous. There is extensive epidural collection completely effacing the thecal sac throughout the lumbar spine until L4-5 and below where the collection becomes ventral and right eccentric. The infection communicates with extensive bilateral abscess within the intrinsic back muscles via the interspinous space at L3-4. Patient had recent subcutaneous collection and left buttocks collection drainage, with packing seen in place. This midline, upper subcutaneous collection communicates with the paravertebral abscess along its superior margin based on postcontrast axial images. Paraspinal and other soft tissues: As above.  Distended bladder Disc levels: Chronic bilateral pars defects at L5. Critical Value/emergent results were called by telephone at the time of interpretation on 03/22/2018 at 3:04 pm to Dr. Thedore MinsSingh , who verbally acknowledged these results. IMPRESSION: 1. Epidural abscess from C2 to sacrum as described. Maximal cord compression from T3-4 to T5-6. The thecal sac is completely effaced from L1 to L4-5. At the L3-4 interspinous space the spinal abscess communicates with large bilateral abscesses within the intrinsic back muscles. There is osteomyelitis of the L2, L3, and L4 spinous processes. No discitis or facet arthritis. 2. Left paravertebral abscess along the lower thoracic spine with small left empyema that is new from CT 2 days ago. 3. Distended bladder Electronically Signed   By: Marnee SpringJonathon  Watts M.D.   On: 03/22/2018 15:11   [place] Ekg Site Rite  Result Date: 03/21/2018 If Site Rite image not attached, placement could not be confirmed due to current cardiac rhythm.   Assessment/Plan: I was notified by Dr. Thedore MinsSingh regarding the imaging results of the patient's cervical, thoracic, and lumbar spine.  These show extensive epidural spinal abscesses, essentially from C 2 to the sacrum.  The cervical abscess is ventral to the cord.  The thoracic  abscess is dorsal to the cord and there is canal narrowing  in the mid-thoracic and in the lumbar spine.  Despite these findings, the patient has had a neurologic exam which demonstrates full strength in both upper and lower extremities and without loss of bowel or bladder control.  I recommend continued neuro checks, but not surgery at the present time.  I am hopeful that antibiotics will treat the patient's extensive infection. I will follow patient with you.    LOS: 2 days    Dorian Heckle, MD 03/22/2018, 6:26 PM

## 2018-03-22 NOTE — Anesthesia Preprocedure Evaluation (Addendum)
Anesthesia Evaluation  Patient identified by MRN, date of birth, ID band Patient awake  General Assessment Comment:Pt refusing to answer questions  Reviewed: Allergy & Precautions, NPO status , Patient's Chart, lab work & pertinent test results  History of Anesthesia Complications Negative for: history of anesthetic complications  Airway Mallampati: II  TM Distance: >3 FB Neck ROM: Full    Dental  (+) Poor Dentition, Chipped, Dental Advisory Given   Pulmonary Current Smoker,    breath sounds clear to auscultation       Cardiovascular hypertension, Pt. on medications (-) angina Rhythm:Regular Rate:Tachycardia     Neuro/Psych Back pain    GI/Hepatic negative GI ROS, (+)     substance abuse (opioid and cocaine dependence)  , Hepatitis -, C  Endo/Other  diabetes (glu 90), Insulin Dependent, Oral Hypoglycemic Agents  Renal/GU negative Renal ROS     Musculoskeletal   Abdominal   Peds  Hematology   Anesthesia Other Findings   Reproductive/Obstetrics                            Anesthesia Physical Anesthesia Plan  ASA: III  Anesthesia Plan: General   Post-op Pain Management:    Induction: Intravenous  PONV Risk Score and Plan: 1 and Treatment may vary due to age or medical condition and Ondansetron  Airway Management Planned: Oral ETT  Additional Equipment:   Intra-op Plan:   Post-operative Plan: Extubation in OR  Informed Consent: I have reviewed the patients History and Physical, chart, labs and discussed the procedure including the risks, benefits and alternatives for the proposed anesthesia with the patient or authorized representative who has indicated his/her understanding and acceptance.   Dental advisory given  Plan Discussed with: CRNA and Surgeon  Anesthesia Plan Comments: (Plan routine monitors, GETA)        Anesthesia Quick Evaluation

## 2018-03-22 NOTE — Op Note (Signed)
03/20/2018 - 03/22/2018  10:58 AM  PATIENT:  Tony ClevelandBrandon Grinder  42 y.o. male  PRE-OPERATIVE DIAGNOSIS:  Back abscess  POST-OPERATIVE DIAGNOSIS: Back abscess  PROCEDURE:  Procedure(s): INCISION AND DRAINAGE LARGE BACK ABSCESS X 2   SURGEON:  Violeta GelinasBurke Rhylee Pucillo, MD  ASSISTANTS: Carlena BjornstadBrooke Meuth, PA-C   ANESTHESIA:   general  EBL:  Total I/O In: 500 [I.V.:500] Out: -   BLOOD ADMINISTERED:none  DRAINS: none   SPECIMEN:  No Specimen  DISPOSITION OF SPECIMEN:  N/A  COUNTS:  YES  DICTATION: .Dragon Dictation Findings: Large abscess left lower back, large abscess inferior left lower back, both with large volume of purulent fluid  Procedure in detail: Mr. Kyung RuddKennedy is brought to the operating room for incision and drainage of back abscess.  Informed consent was obtained.  He is currently on Ancef for MSSA bacteremia.  He was brought to the operating room.  General endotracheal anesthesia was administered.  He was placed in prone position and his back and upper buttocks were prepped and draped in sterile fashion.  We did a timeout procedure.  We began by making an elliptical incision using the Bovie at the more superior of the 2 fluctuant regions.  At small bit of skin was removed and we entered a large cavity filled with pus.  The pus was sent for cultures.  All the purulent fluid was evacuated.  The cavity had some loculations which were bluntly freed up.  He had another fluctuant area more inferiorly at the superior portion of his left buttock region.  These 2 areas did not connect so we made an additional elliptical incision using cautery over the lower fluctuant collection.  Similarly we evacuated a large amount of pus from that area.  Both wounds were copiously irrigated with saline.  Hemostasis was ensured.  And each was then packed with sterile saline soaked Kerlix.  Bulky sterile dressing was applied.  All counts were correct.  He remained in the operating room under anesthesia to undergo TEE.   There were no apparent complications. PATIENT DISPOSITION:  remains intubated for TEE   Delay start of Pharmacological VTE agent (>24hrs) due to surgical blood loss or risk of bleeding:  no  Violeta GelinasBurke Kyri Dai, MD, MPH, FACS Pager: [phone]  9/23/201910:58 AM

## 2018-03-22 NOTE — Progress Notes (Signed)
INFECTIOUS DISEASE PROGRESS NOTE  ID: Tony Long is a 42 y.o. male with  Principal Problem:   Opioid use disorder, severe, dependence (HCC) Active Problems:   Non compliance w medication regimen   Chronic hepatitis C without hepatic coma (HCC)   Osteomyelitis (HCC)   Back abscess   Bacteremia due to Gram-positive bacteria  Subjective: In PACU, agitated, with sitter  Abtx:  Anti-infectives (From admission, onward)   Start     Dose/Rate Route Frequency Ordered Stop   03/21/18 0930  [MAR Hold]  ceFAZolin (ANCEF) IVPB 2g/100 mL premix     (MAR Hold since Mon 03/22/2018 at 0840. Reason: Transfer to a Procedural area.)   2 g 200 mL/hr over 30 Minutes Intravenous Every 8 hours 03/21/18 0920        Medications:  Scheduled: . [MAR Hold] buprenorphine-naloxone  1 tablet Sublingual BID  . cloNIDine  0.2 mg Oral TID  . [MAR Hold] gabapentin  300 mg Oral TID  . [MAR Hold] heparin injection (subcutaneous)  5,000 Units Subcutaneous Q8H  . [MAR Hold] insulin aspart  0-24 Units Subcutaneous Q4H  . [MAR Hold] insulin glargine  20 Units Subcutaneous BID  . [MAR Hold] LORazepam  1 mg Oral Once  . [MAR Hold] nicotine  21 mg Transdermal Daily  . [MAR Hold] potassium chloride  40 mEq Oral Once  . [MAR Hold] sodium chloride flush  10-40 mL Intracatheter Q12H    Objective: Vital signs in last 24 hours: Temp:  [97.6 F (36.4 C)-98.5 F (36.9 C)] 98.5 F (36.9 C) (09/23 0804) Pulse Rate:  [110-129] 119 (09/23 0804) Resp:  [18-26] 18 (09/23 0804) BP: (111-169)/(58-114) 111/58 (09/23 0804) SpO2:  [94 %-100 %] 94 % (09/23 0330)   General appearance: no distress Resp: clear to auscultation bilaterally Cardio: regular rate and rhythm and systolic murmur: systolic ejection 3/6, crescendo at 2nd left intercostal space GI: normal findings: bowel sounds normal and soft, non-tender Extremities: edema none  Lab Results Recent Labs    03/21/18 1337  03/21/18 2228 03/22/18 0339  WBC  26.3*  --   --  25.3*  HGB 10.3*  --   --  11.2*  HCT 34.8*  --   --  35.3*  NA  --    < > 136 141  K  --    < > 2.8* 2.9*  CL  --    < > 106 107  CO2  --    < > 18* 21*  BUN  --    < > 11 9  CREATININE  --    < > 0.93 0.81   < > = values in this interval not displayed.   Liver Panel Recent Labs    03/20/18 0759 03/22/18 0339  PROT 7.5 7.0  ALBUMIN 2.3* 2.0*  AST 13* 9*  ALT 8 8  ALKPHOS 165* 154*  BILITOT 2.3* 0.8   Sedimentation Rate Recent Labs    03/20/18 0759  ESRSEDRATE 116*   C-Reactive Protein Recent Labs    03/20/18 0759  CRP 34.8*    Microbiology: Recent Results (from the past 240 hour(s))  Blood Culture (routine x 2)     Status: Abnormal (Preliminary result)   Collection Time: 03/20/18  7:59 AM  Result Value Ref Range Status   Specimen Description   Final    BLOOD EJ Performed at [facility], [address], [place], [place] [address]    Special Requests   Final    BOTTLES  DRAWN AEROBIC AND ANAEROBIC Blood Culture adequate volume Performed at [name], [address] [place], [place] [address]    Culture  Setup Time   Final    GRAM POSITIVE COCCI IN BOTH AEROBIC AND ANAEROBIC BOTTLES CRITICAL RESULT CALLED TO, READ BACK BY AND VERIFIED WITH: MATT MCBANE ON 03/21/18 AT 0001 QSD Performed at [facility], [address] [place], [place] [address]    Culture STAPHYLOCOCCUS AUREUS (A)  Final   Report Status PENDING  Incomplete  Blood Culture (routine x 2)     Status: Abnormal (Preliminary result)   Collection Time: 03/20/18  7:59 AM  Result Value Ref Range Status   Specimen Description   Final    BLOOD EJ Performed at [facility], [address] [place], [place] [address]    Special Requests   Final    BOTTLES DRAWN AEROBIC AND ANAEROBIC Blood Culture adequate volume Performed at [facility], [address] [place], [place] [address]    Culture  Setup Time   Final    GRAM POSITIVE  COCCI IN BOTH AEROBIC AND ANAEROBIC BOTTLES CRITICAL RESULT CALLED TO, READ BACK BY AND VERIFIED WITH: MATT MCBANE ON 03/21/18 AT 0001 QSD    Culture (A)  Final    STAPHYLOCOCCUS AUREUS SUSCEPTIBILITIES TO FOLLOW Performed at [facility], [address] [place], [place] [address]    Report Status PENDING  Incomplete  Blood Culture ID Panel (Reflexed)     Status: Abnormal   Collection Time: 03/20/18  7:59 AM  Result Value Ref Range Status   Enterococcus species NOT DETECTED NOT DETECTED Final   Listeria monocytogenes NOT DETECTED NOT DETECTED Final   Staphylococcus species DETECTED (A) NOT DETECTED Final    Comment: CRITICAL RESULT CALLED TO, READ BACK BY AND VERIFIED WITH: MATT MCBANE ON 03/21/18 AT 0001 QSD    Staphylococcus aureus DETECTED (A) NOT DETECTED Final    Comment: Methicillin (oxacillin) susceptible Staphylococcus aureus (MSSA). Preferred therapy is anti staphylococcal beta lactam antibiotic (Cefazolin or Nafcillin), unless clinically contraindicated. CRITICAL RESULT CALLED TO, READ BACK BY AND VERIFIED WITH: MATT MCBANE ON 03/21/18 AT 0001 QSD    Methicillin resistance NOT DETECTED NOT DETECTED Final   Streptococcus species NOT DETECTED NOT DETECTED Final   Streptococcus agalactiae NOT DETECTED NOT DETECTED Final   Streptococcus pneumoniae NOT DETECTED NOT DETECTED Final   Streptococcus pyogenes NOT DETECTED NOT DETECTED Final   Acinetobacter baumannii NOT DETECTED NOT DETECTED Final   Enterobacteriaceae species NOT DETECTED NOT DETECTED Final   Enterobacter cloacae complex NOT DETECTED NOT DETECTED Final   Escherichia coli NOT DETECTED NOT DETECTED Final   Klebsiella oxytoca NOT DETECTED NOT DETECTED Final   Klebsiella pneumoniae NOT DETECTED NOT DETECTED Final   Proteus species NOT DETECTED NOT DETECTED Final   Serratia marcescens NOT DETECTED NOT DETECTED Final   Haemophilus influenzae NOT DETECTED NOT DETECTED Final   Neisseria meningitidis NOT DETECTED NOT  DETECTED Final   Pseudomonas aeruginosa NOT DETECTED NOT DETECTED Final   Candida albicans NOT DETECTED NOT DETECTED Final   Candida glabrata NOT DETECTED NOT DETECTED Final   Candida krusei NOT DETECTED NOT DETECTED Final   Candida parapsilosis NOT DETECTED NOT DETECTED Final   Candida tropicalis NOT DETECTED NOT DETECTED Final    Comment: Performed at [facility], [address] [place], [place] [address]  MRSA PCR Screening     Status: None   Collection Time: 03/20/18  3:50 PM  Result  Value Ref Range Status   MRSA by PCR NEGATIVE NEGATIVE Final    Comment:        The GeneXpert MRSA Assay (FDA approved for NASAL specimens only), is one component of a comprehensive MRSA colonization surveillance program. It is not intended to diagnose MRSA infection nor to guide or monitor treatment for MRSA infections. Performed at [facility], [address], [place], [place] [address]     Studies/Results: [place] Ekg Site Rite  Result Date: 03/21/2018 If [facility] image not attached, placement could not be confirmed due to current cardiac rhythm.    Assessment/Plan: MSSA bacteremia TV IE (noted on TEE today) L3-4 osteo, abscess, myositis IVDA DM Hep C (treated?)  Total days of antibiotics: 2 (ancef)  Await repeat BCx PIC placed before repeat BCx resulted due to lack of access.  Needs repeat Hep C and HIV tests Will need long term anbx, will need to assess him d/c situation.          Johny Sax MD, FACP Infectious Diseases (pager) [phone] [email or web address] 03/22/2018, 1:29 PM  LOS: 2 days

## 2018-03-22 NOTE — Transfer of Care (Signed)
Immediate Anesthesia Transfer of Care Note  Patient: Tony Long  Procedure(s) Performed: INCISION AND DRAINAGE ABSCESS (N/A ) TRANSESOPHAGEAL ECHOCARDIOGRAM (TEE)  Patient Location: PACU  Anesthesia Type:General  Level of Consciousness: awake and pateint uncooperative  Airway & Oxygen Therapy: Patient Spontanous Breathing and Patient connected to nasal cannula oxygen  Post-op Assessment: Report given to RN, Post -op Vital signs reviewed and stable and Patient moving all extremities X 4  Post vital signs: Reviewed and stable  Last Vitals:  Vitals Value Taken Time  BP 160/79 03/22/2018  2:24 PM  Temp    Pulse 130 03/22/2018  2:28 PM  Resp 20 03/22/2018  2:28 PM  SpO2 94 % 03/22/2018  2:28 PM  Vitals shown include unvalidated device data.  Last Pain:  Vitals:   03/22/18 0804  TempSrc: Axillary  PainSc:       Patients Stated Pain Goal: 0 (03/22/18 [id number])  Complications: No apparent anesthesia complications

## 2018-03-22 NOTE — Progress Notes (Signed)
Subjective/Chief Complaint: C/O pain at abscess on back   Objective: Vital signs in last 24 hours: Temp:  [97.6 F (36.4 C)-97.8 F (36.6 C)] 97.6 F (36.4 C) (09/23 0330) Pulse Rate:  [110-129] 129 (09/23 0330) Resp:  [18-26] 22 (09/23 0646) BP: (119-169)/(76-114) 119/76 (09/23 0616) SpO2:  [94 %-100 %] 94 % (09/23 0330)    Intake/Output from previous day: 09/22 0701 - 09/23 0700 In: 1131.5 [P.O.:450; I.V.:381.5; IV Piggyback:300] Out: -  Intake/Output this shift: No intake/output data recorded.  General appearance: alert and cooperative Resp: clear to auscultation bilaterally Cardio: regular rate and rhythm GI: soft, non-tender; bowel sounds normal; no masses,  no organomegaly large fluctuant sub cut collection lower back on L - tender  Lab Results:  Recent Labs    03/21/18 1337 03/22/18 0339  WBC 26.3* 25.3*  HGB 10.3* 11.2*  HCT 34.8* 35.3*  PLT 374 386   BMET Recent Labs    03/21/18 2228 03/22/18 0339  NA 136 141  K 2.8* 2.9*  CL 106 107  CO2 18* 21*  GLUCOSE 143* 127*  BUN 11 9  CREATININE 0.93 0.81  CALCIUM 9.3 9.4   PT/INR Recent Labs    03/20/18 0759 03/21/18 1337  LABPROT 14.9 17.1*  INR 1.18 1.40   ABG No results for input(s): PHART, HCO3 in the last 72 hours.  Invalid input(s): PCO2, PO2  Studies/Results: Ct Thoracic Spine Wo Contrast  Result Date: 03/20/2018 CLINICAL DATA:  Progressive back pain.  Swelling of the lower back. EXAM: CT THORACIC SPINE WITHOUT CONTRAST TECHNIQUE: Multidetector CT images of the thoracic were obtained using the standard protocol without intravenous contrast. COMPARISON:  None. FINDINGS: Alignment: Normal. 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 Paraspinal and other soft tissues: There is abnormal gas and fluid in the soft tissues of the right side of the base of the neck and in the right supraclavicular region. Paraspinal soft tissues appear normal throughout the thoracic spine. Disc levels:  There is no evidence of disc protrusion or significant disc bulging or spinal or foraminal stenosis or other significant abnormality of the thoracic spine. IMPRESSION: 1. Evidence of cellulitis involving the right side of the base of the neck extending into the right supraclavicular region with fluid and gas in the soft tissues at the base of the right side of the neck. 2. No significant abnormality of the thoracic spine. 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 Electronically Signed   By: Francene Boyers M.D.   On: 03/20/2018 11:25   Ct Lumbar Spine Wo Contrast  Addendum Date: 03/20/2018   ADDENDUM REPORT: 03/20/2018 11:44 ADDENDUM: Critical Value/emergent results were called by telephone at the time of interpretation on 03/20/2018 at 11:30 am to Dr. Sharyn Creamer , who verbally acknowledged these results. Electronically Signed   By: Francene Boyers M.D.   On: 03/20/2018 11:44   Result Date: 03/20/2018 CLINICAL DATA:  Increasing low back pain and soft tissue swelling. EXAM: CT LUMBAR SPINE WITHOUT CONTRAST TECHNIQUE: Multidetector CT imaging of the lumbar spine was performed without intravenous contrast administration. Multiplanar CT image reconstructions were also generated. IV contrast could not be utilized due to the lack of an appropriate IV. COMPARISON:  None. FINDINGS: Segmentation: 5 lumbar type vertebrae. Alignment: Normal. Vertebrae: There is a moth-eaten appearance of the spinous processes of L3 and L4 which is worrisome for osteomyelitis. Bilateral pars defects at L5 with grade 1 spondylolisthesis. 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 Paraspinal and other soft tissues: There is an extensive abnormal fluid collection  in the subcutaneous soft tissues of the posterior aspect of the back extending from approximately L1-2 to S3. This fluid collection is lobulated and measures approximately 20 x 9 x 2.5 cm. It is centered slightly to the left of midline and has a mass effect upon the adjacent posterior  paraspinal muscles. There is abnormal lucency in the underlying paraspinal muscles which could represent myositis. Disc levels: T11-12: No significant abnormality. Butterfly T12 vertebra. T12-L1: No significant abnormality. L1-2: Normal disc. Abnormal edema in the posterior paraspinal musculature with adjacent fluid collection in the subcutaneous fat of the posterior aspect of the back as described above. L2-3: Normal disc. L3-4: Normal disc. Lucency in the posterior paraspinal soft tissues extends to the posterior aspect of the thecal sac on image 80 of series 4 but there is no discrete epidural abscess. L4-5: Normal disc.  No evidence of epidural abscess. L5-S1: Grade 1 spondylolisthesis. No disc bulging or protrusion. Bilateral pars defects. No visible epidural abscess. IMPRESSION: 1. Extensive abnormal fluid collection in the subcutaneous fat of the midline of the back with underlying marked abnormality of the posterior paraspinal musculature from L1-2 through S3. This is worrisome for subcutaneous abscess and myositis. 2. Moth-eaten appearance of the spinous processes of L3 and L4 consistent with osteomyelitis. 3. No discrete epidural abscess. However, the abnormal edema in the paraspinal musculature extends to the posterior aspect of the spinal canal at L3-4. 4. MRI with and without contrast may better define the extent of the soft tissue and infection and could detect epidural extension that is not apparent on this unenhanced CT scan. Electronically Signed: By: Francene BoyersJames  Maxwell M.D. On: 03/20/2018 11:18   [place] Ekg Site Rite  Result Date: 03/21/2018 If Site Rite image not attached, placement could not be confirmed due to current cardiac rhythm.   Anti-infectives: Anti-infectives (From admission, onward)   Start     Dose/Rate Route Frequency Ordered Stop   03/21/18 0930  ceFAZolin (ANCEF) IVPB 2g/100 mL premix     2 g 200 mL/hr over 30 Minutes Intravenous Every 8 hours 03/21/18 0920         Assessment/Plan: Large back abscess - for incision and drainage in the OR. I discussed the procedure, risks and benefits with him and he agrees.  ID - MSSA bacteremia, on Ancef per ID Service. Will get culture in OR  I D/W Dr. Thedore MinsSingh as well  LOS: 2 days    Liz MaladyBurke E Doneisha Ivey 03/22/2018

## 2018-03-22 NOTE — Progress Notes (Signed)
@IPLOG @        PROGRESS NOTE                                                                                                                                                                                                             Patient Demographics:    Tony Long, is a 42 y.o. male, DOB - February 29, 1976, ZOX:[id number]  Admit date - 03/20/2018   Admitting Physician Kendell Bane, MD  Outpatient Primary MD for the patient is [name], FNP  LOS - 2  CC Back pain  Brief Narrative  Tony Long is a 42 y.o. male   H/o insulin dependent DM2, HTN, h/o IVDU, reports has low back pain for the last 4 weeks, he was seen in the ED a week ago was sent home with pain meds and muscle relaxer, pain got worse, he started to use his son's subutex for the last week, pain continue to get worse and he starts to feel week in both his legs, he denies fever. no bowel and bladder incontinence, no saddle anesthesia,  He also reports neck pain. he returned to the [facility] ED today.   Subjective:    [name] today in bed eyes closed, comfortable, will not talk today.   Assessment  & Plan :     1.  Lower back paraspinal abscess with a nonspecific fluid soft tissue collection on the right side of the neck.  General surgery, IR, ID along with neurosurgery have been consulted.  Plan is to take him to OR for incision and drainage of his lower back wound on 03/22/2018 with general surgery, continue empiric IV cefazolin, 2 out of 2 blood cultures from [place] are growing MSSA.  Transthoracic echocardiogram has been ordered if negative will require TEE as well.  Case was discussed with Dr. Earle Gell ID.  Also do to nonspecific 5-week history of see and L-spine pain, MRI C, T and L-spine have been ordered under anesthesia which is pending.  Have also called ENT x 3 on 03/22/2018 each time discussed with the secretary and was then placed on hold for over 5 minutes but no response.   2.  DKA in  a patient with DM type II.  Continue DKA protocol, twice daily Lantus started, will monitor electrolytes and CBG control.  Lab Results  Component Value Date   HGBA1C 12.6 (A) 02/26/2018   CBG (last 3)  Recent Labs    03/22/18 0323 03/22/18 0803 03/22/18 0906  GLUCAP 123* 91 90  3.  MSSA bacteremia.  See #1 above.  4.  HX of smoking, intermittent alcohol use, cocaine, Suboxone abuse - told to quit all, on Suboxone withdrawal protocol, PRN oral narcotic for breakthrough.  Will monitor.  5.  Essential hypertension.  For now clonidine will increase the dose and will continue to monitor and adjust.    Family Communication  :  Parents bedside 03/21/18  Code Status :  Full  Disposition Plan  :  Tele  Consults  :  ID,N-Surg, CCS, IR  Procedures  :    TTE  MRI C-T-L Spine -  CT - 1. Evidence of cellulitis involving the right side of the base of the neck extending into the right supraclavicular region with fluid and gas in the soft tissues at the base of the right side of the neck. 2. No significant abnormality of the thoracic spine. 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  CT -  1. Extensive abnormal fluid collection in the subcutaneous fat of the midline of the back with underlying marked abnormality of the posterior paraspinal musculature from L1-2 through S3. This is worrisome for subcutaneous abscess and myositis. 2. Moth-eaten appearance of the spinous processes of L3 and L4 consistent with osteomyelitis. 3. No discrete epidural abscess. However, the abnormal edema in the paraspinal musculature extends to the posterior aspect of the spinal canal at L3-4. 4. MRI with and without contrast may better define the extent of the soft tissue and infection and could detect epidural extension that is not apparent on this unenhanced CT scan.    DVT Prophylaxis  :   Heparin    Lab Results  Component Value Date   PLT 386 03/22/2018    Diet :  Diet Order            Diet NPO time  specified Except for: Sips with Meds  Diet effective midnight               Inpatient Medications Scheduled Meds: . [MAR Hold] buprenorphine-naloxone  1 tablet Sublingual BID  . [MAR Hold] cloNIDine  0.1 mg Oral TID  . [MAR Hold] gabapentin  300 mg Oral TID  . [MAR Hold] heparin injection (subcutaneous)  5,000 Units Subcutaneous Q8H  . [MAR Hold] insulin aspart  0-24 Units Subcutaneous Q4H  . [MAR Hold] insulin glargine  20 Units Subcutaneous BID  . [MAR Hold] LORazepam  1 mg Oral Once  . [MAR Hold] nicotine  21 mg Transdermal Daily  . [MAR Hold] potassium chloride  40 mEq Oral Once  . [MAR Hold] sodium chloride flush  10-40 mL Intracatheter Q12H   Continuous Infusions: . [MAR Hold]  ceFAZolin (ANCEF) IV 2 g (03/22/18 0515)  . [MAR Hold] magnesium sulfate 1 - 4 g bolus IVPB    . 0.9 % sodium chloride with kcl     PRN Meds:.buprenorphine-naloxone, [MAR Hold] hydrALAZINE, [MAR Hold]  HYDROmorphone (DILAUDID) injection, [MAR Hold] LORazepam, [MAR Hold] oxyCODONE-acetaminophen, [MAR Hold] sodium chloride flush  Antibiotics  :   Anti-infectives (From admission, onward)   Start     Dose/Rate Route Frequency Ordered Stop   03/21/18 0930  [MAR Hold]  ceFAZolin (ANCEF) IVPB 2g/100 mL premix     (MAR Hold since Mon 03/22/2018 at 0840. Reason: Transfer to a Procedural area.)   2 g 200 mL/hr over 30 Minutes Intravenous Every 8 hours 03/21/18 0920            Objective:   Vitals:   03/22/18 0440 03/22/18 1610 03/22/18 9604  03/22/18 0804  BP: (!) 168/102 119/76  (!) 111/58  Pulse:    (!) 119  Resp: (!) 25 (!) 21 (!) 22 18  Temp:    98.5 F (36.9 C)  TempSrc:    Axillary  SpO2:      Weight:      Height:        Wt Readings from Last 3 Encounters:  03/21/18 68 kg  03/20/18 68 kg  03/07/18 70 kg     Intake/Output Summary (Last 24 hours) at 03/22/2018 1033 Last data filed at 03/22/2018 0326 Gross per 24 hour  Intake 1131.48 ml  Output -  Net 1131.48 ml     Physical  Exam  Awake Alert, does not want to talk today, flat affect  [place].AT,PERRAL Supple Neck,No JVD, No cervical lymphadenopathy appriciated.  Symmetrical Chest wall movement, Good air movement bilaterally, CTAB RRR,No Gallops, Rubs or new Murmurs, No Parasternal Heave +ve B.Sounds, Abd Soft, No tenderness, No organomegaly appriciated, No rebound - guarding or rigidity. No Cyanosis, Clubbing or edema, No new Rash or bruise , Large L.perilumbar cystic collection    Data Review:    CBC Recent Labs  Lab 03/20/18 0759 03/21/18 1337 03/22/18 0339  WBC 22.2* 26.3* 25.3*  HGB 10.5* 10.3* 11.2*  HCT 32.1* 34.8* 35.3*  PLT 388 374 386  MCV 95.6 103.6* 97.0  MCH 31.3 30.7 30.8  MCHC 32.7 29.6* 31.7  RDW 14.3 13.1 12.6  LYMPHSABS 0.8*  --   --   MONOABS 1.4*  --   --   EOSABS 0.0  --   --   BASOSABS 0.2*  --   --     Chemistries  Recent Labs  Lab 03/20/18 0759 03/20/18 1235 03/21/18 1338 03/21/18 2228 03/22/18 0339  NA 138 140 136 136 141  K 3.7 3.8 3.4* 2.8* 2.9*  CL 92* 97* 102 106 107  CO2 18* 17* 9* 18* 21*  GLUCOSE 368* 367* 348* 143* 127*  BUN 11 14 14 11 9   CREATININE 0.97 0.97 1.53* 0.93 0.81  CALCIUM 9.2 9.2 9.3 9.3 9.4  MG 1.8  --  1.6*  --  1.7  AST 13*  --   --   --  9*  ALT 8  --   --   --  8  ALKPHOS 165*  --   --   --  154*  BILITOT 2.3*  --   --   --  0.8   ------------------------------------------------------------------------------------------------------------------ No results for input(s): CHOL, HDL, LDLCALC, TRIG, CHOLHDL, LDLDIRECT in the last 72 hours.  Lab Results  Component Value Date   HGBA1C 12.6 (A) 02/26/2018   ------------------------------------------------------------------------------------------------------------------ No results for input(s): TSH, T4TOTAL, T3FREE, THYROIDAB in the last 72 hours.  Invalid input(s):  FREET3 ------------------------------------------------------------------------------------------------------------------ No results for input(s): VITAMINB12, FOLATE, FERRITIN, TIBC, IRON, RETICCTPCT in the last 72 hours.  Coagulation profile Recent Labs  Lab 03/20/18 0759 03/21/18 1337  INR 1.18 1.40    No results for input(s): DDIMER in the last 72 hours.  Cardiac Enzymes No results for input(s): CKMB, TROPONINI, MYOGLOBIN in the last 168 hours.  Invalid input(s): CK ------------------------------------------------------------------------------------------------------------------ No results found for: BNP  Micro Results Recent Results (from the past 240 hour(s))  Blood Culture (routine x 2)     Status: None (Preliminary result)   Collection Time: 03/20/18  7:59 AM  Result Value Ref Range Status   Specimen Description BLOOD EJ  Final   Special Requests   Final    BOTTLES  DRAWN AEROBIC AND ANAEROBIC Blood Culture adequate volume   Culture  Setup Time   Final    GRAM POSITIVE COCCI IN BOTH AEROBIC AND ANAEROBIC BOTTLES CRITICAL RESULT CALLED TO, READ BACK BY AND VERIFIED WITH: MATT MCBANE ON 03/21/18 AT 0001 QSD Performed at [facility], [address] [place], [place] [address]    Culture GRAM POSITIVE COCCI  Final   Report Status PENDING  Incomplete  Blood Culture (routine x 2)     Status: Abnormal (Preliminary result)   Collection Time: 03/20/18  7:59 AM  Result Value Ref Range Status   Specimen Description   Final    BLOOD EJ Performed at [facility], [address], [place], [place] [address]    Special Requests   Final    BOTTLES DRAWN AEROBIC AND ANAEROBIC Blood Culture adequate volume Performed at [facility], [address] [place], [place] [address]    Culture  Setup Time   Final    GRAM POSITIVE COCCI IN BOTH AEROBIC AND ANAEROBIC BOTTLES CRITICAL RESULT CALLED TO, READ BACK BY AND VERIFIED WITH: MATT MCBANE ON 03/21/18 AT  0001 QSD    Culture (A)  Final    STAPHYLOCOCCUS AUREUS SUSCEPTIBILITIES TO FOLLOW Performed at [facility], [address] [place], [place] [address]    Report Status PENDING  Incomplete  Blood Culture ID Panel (Reflexed)     Status: Abnormal   Collection Time: 03/20/18  7:59 AM  Result Value Ref Range Status   Enterococcus species NOT DETECTED NOT DETECTED Final   Listeria monocytogenes NOT DETECTED NOT DETECTED Final   Staphylococcus species DETECTED (A) NOT DETECTED Final    Comment: CRITICAL RESULT CALLED TO, READ BACK BY AND VERIFIED WITH: MATT MCBANE ON 03/21/18 AT 0001 QSD    Staphylococcus aureus DETECTED (A) NOT DETECTED Final    Comment: Methicillin (oxacillin) susceptible Staphylococcus aureus (MSSA). Preferred therapy is anti staphylococcal beta lactam antibiotic (Cefazolin or Nafcillin), unless clinically contraindicated. CRITICAL RESULT CALLED TO, READ BACK BY AND VERIFIED WITH: MATT MCBANE ON 03/21/18 AT 0001 QSD    Methicillin resistance NOT DETECTED NOT DETECTED Final   Streptococcus species NOT DETECTED NOT DETECTED Final   Streptococcus agalactiae NOT DETECTED NOT DETECTED Final   Streptococcus pneumoniae NOT DETECTED NOT DETECTED Final   Streptococcus pyogenes NOT DETECTED NOT DETECTED Final   Acinetobacter baumannii NOT DETECTED NOT DETECTED Final   Enterobacteriaceae species NOT DETECTED NOT DETECTED Final   Enterobacter cloacae complex NOT DETECTED NOT DETECTED Final   Escherichia coli NOT DETECTED NOT DETECTED Final   Klebsiella oxytoca NOT DETECTED NOT DETECTED Final   Klebsiella pneumoniae NOT DETECTED NOT DETECTED Final   Proteus species NOT DETECTED NOT DETECTED Final   Serratia marcescens NOT DETECTED NOT DETECTED Final   Haemophilus influenzae NOT DETECTED NOT DETECTED Final   Neisseria meningitidis NOT DETECTED NOT DETECTED Final   Pseudomonas aeruginosa NOT DETECTED NOT DETECTED Final   Candida albicans NOT DETECTED NOT DETECTED Final    Candida glabrata NOT DETECTED NOT DETECTED Final   Candida krusei NOT DETECTED NOT DETECTED Final   Candida parapsilosis NOT DETECTED NOT DETECTED Final   Candida tropicalis NOT DETECTED NOT DETECTED Final    Comment: Performed at [facility] [address] [place], [place] [address]  MRSA PCR Screening     Status: None   Collection Time: 03/20/18  3:50 PM  Result Value Ref Range Status   MRSA by PCR NEGATIVE NEGATIVE Final  Comment:        The GeneXpert MRSA Assay (FDA approved for NASAL specimens only), is one component of a comprehensive MRSA colonization surveillance program. It is not intended to diagnose MRSA infection nor to guide or monitor treatment for MRSA infections. Performed at [facility], [address] [place], [place] [address]     Radiology Reports Ct Thoracic Spine Wo Contrast  Result Date: 03/20/2018 CLINICAL DATA:  Progressive back pain.  Swelling of the lower back. EXAM: CT THORACIC SPINE WITHOUT CONTRAST TECHNIQUE: Multidetector CT images of the thoracic were obtained using the standard protocol without intravenous contrast. COMPARISON:  None. FINDINGS: Alignment: Normal. 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 Paraspinal and other soft tissues: There is abnormal gas and fluid in the soft tissues of the right side of the base of the neck and in the right supraclavicular region. Paraspinal soft tissues appear normal throughout the thoracic spine. Disc levels: There is no evidence of disc protrusion or significant disc bulging or spinal or foraminal stenosis or other significant abnormality of the thoracic spine. IMPRESSION: 1. Evidence of cellulitis involving the right side of the base of the neck extending into the right supraclavicular region with fluid and gas in the soft tissues at the base of the right side of the neck. 2. No significant abnormality of the thoracic spine. 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 Electronically Signed    By: Francene Boyers M.D.   On: 03/20/2018 11:25   Ct Lumbar Spine Wo Contrast  Addendum Date: 03/20/2018   ADDENDUM REPORT: 03/20/2018 11:44 ADDENDUM: Critical Value/emergent results were called by telephone at the time of interpretation on 03/20/2018 at 11:30 am to Dr. Sharyn Creamer , who verbally acknowledged these results. Electronically Signed   By: Francene Boyers M.D.   On: 03/20/2018 11:44   Result Date: 03/20/2018 CLINICAL DATA:  Increasing low back pain and soft tissue swelling. EXAM: CT LUMBAR SPINE WITHOUT CONTRAST TECHNIQUE: Multidetector CT imaging of the lumbar spine was performed without intravenous contrast administration. Multiplanar CT image reconstructions were also generated. IV contrast could not be utilized due to the lack of an appropriate IV. COMPARISON:  None. FINDINGS: Segmentation: 5 lumbar type vertebrae. Alignment: Normal. Vertebrae: There is a moth-eaten appearance of the spinous processes of L3 and L4 which is worrisome for osteomyelitis. Bilateral pars defects at L5 with grade 1 spondylolisthesis. 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 Paraspinal and other soft tissues: There is an extensive abnormal fluid collection in the subcutaneous soft tissues of the posterior aspect of the back extending from approximately L1-2 to S3. This fluid collection is lobulated and measures approximately 20 x 9 x 2.5 cm. It is centered slightly to the left of midline and has a mass effect upon the adjacent posterior paraspinal muscles. There is abnormal lucency in the underlying paraspinal muscles which could represent myositis. Disc levels: T11-12: No significant abnormality. Butterfly T12 vertebra. T12-L1: No significant abnormality. L1-2: Normal disc. Abnormal edema in the posterior paraspinal musculature with adjacent fluid collection in the subcutaneous fat of the posterior aspect of the back as described above. L2-3: Normal disc. L3-4: Normal disc. Lucency in the posterior paraspinal soft tissues  extends to the posterior aspect of the thecal sac on image 80 of series 4 but there is no discrete epidural abscess. L4-5: Normal disc.  No evidence of epidural abscess. L5-S1: Grade 1 spondylolisthesis. No disc bulging or protrusion. Bilateral pars defects. No visible epidural abscess. IMPRESSION: 1. Extensive abnormal fluid collection in the subcutaneous  fat of the midline of the back with underlying marked abnormality of the posterior paraspinal musculature from L1-2 through S3. This is worrisome for subcutaneous abscess and myositis. 2. Moth-eaten appearance of the spinous processes of L3 and L4 consistent with osteomyelitis. 3. No discrete epidural abscess. However, the abnormal edema in the paraspinal musculature extends to the posterior aspect of the spinal canal at L3-4. 4. MRI with and without contrast may better define the extent of the soft tissue and infection and could detect epidural extension that is not apparent on this unenhanced CT scan. Electronically Signed: By: Francene Boyers M.D. On: 03/20/2018 11:18   [place] Ekg Site Rite  Result Date: 03/21/2018 If Site Rite image not attached, placement could not be confirmed due to current cardiac rhythm.   Time Spent in minutes  30   Susa Raring M.D on 03/22/2018 at 10:33 AM  To page go to [email or web address] - password [facility]

## 2018-03-22 NOTE — CV Procedure (Signed)
   Transesophageal Echocardiogram  Indications: Bacteremia, IV drug use  Time out performed  During this procedure the patient is administered general anesthesia, having just had a procedure to drain back abscess by Dr. Janee Mornhompson.  Anesthesia was present.  the patient's heart rate, blood pressure, and oxygen saturation are monitored continuously during the procedure.   Findings:  Left Ventricle: Normal ejection fraction, no wall motion abnormalities EF 55%  Mitral Valve: Normal mitral valve, no MR  Aortic Valve: Normal aortic valve, no aortic regurgitation  Tricuspid Valve: Positive for small to medium sized 1.2 cm tricuspid valve endocarditis on leaflet closer to septum.  Highly mobile.  Mild tricuspid regurgitation.  Left Atrium: Normal  Impression: Tricuspid valve endocarditis present.  Discussed with Dr. Thedore MinsSingh.  ID involved.  With minimal regurgitation present, does not need cardiothoracic surgery evaluation.  6 weeks antibiotics.  Donato SchultzMark Pattrick Bady, MD

## 2018-03-22 NOTE — Progress Notes (Signed)
Inpatient Diabetes Program Recommendations  AACE/ADA: New Consensus Statement on Inpatient Glycemic Control (2015)  Target Ranges:  Prepandial:   less than 140 mg/dL      Peak postprandial:   less than 180 mg/dL (1-2 hours)      Critically ill patients:  140 - 180 mg/dL   Lab Results  Component Value Date   GLUCAP 157 (H) 03/22/2018   HGBA1C 12.6 (A) 02/26/2018    Review of Glycemic ControlResults for Wynelle ClevelandKENNEDY, Bran (MRN [id number]) as of 03/22/2018 17:27  Ref. Range 03/21/2018 23:17 03/22/2018 00:24 03/22/2018 01:23 03/22/2018 02:32 03/22/2018 03:23 03/22/2018 08:03 03/22/2018 09:06 03/22/2018 14:25 03/22/2018 16:19  Glucose-Capillary Latest Ref Range: 70 - 99 mg/dL 562129 (H) 130123 (H) 865105 (H) 103 (H) 123 (H) 91 90 132 (H) 157 (H)   Diabetes history: DM2 Outpatient Diabetes medications: Lantus 40 units q HS, Humalog 10 units tid with meals Current orders for Inpatient glycemic control:  TCTS q 4 hours, Lantus 20 units bid Inpatient Diabetes Program Recommendations:   Agree with current orders.  Will follow up on 03/23/18 re. A1C.  Thanks,  Beryl MeagerJenny Rachael Zapanta, RN, BC-ADM Inpatient Diabetes Coordinator Pager [phone] (8a-5p)

## 2018-03-22 NOTE — Anesthesia Procedure Notes (Signed)
Procedure Name: Intubation Date/Time: 03/22/2018 10:14 AM Performed by: Lowella Dell, CRNA Pre-anesthesia Checklist: Patient identified, Emergency Drugs available, Suction available and Patient being monitored Patient Re-evaluated:Patient Re-evaluated prior to induction Oxygen Delivery Method: Circle System Utilized Preoxygenation: Pre-oxygenation with 100% oxygen Induction Type: IV induction Ventilation: Mask ventilation without difficulty Laryngoscope Size: Mac, 3 and 4 Grade View: Grade I Tube type: Oral Tube size: 7.5 mm Number of attempts: 1 Airway Equipment and Method: Stylet Placement Confirmation: ETT inserted through vocal cords under direct vision,  positive ETCO2 and breath sounds checked- equal and bilateral Secured at: 20 cm Tube secured with: Tape Dental Injury: Teeth and Oropharynx as per pre-operative assessment

## 2018-03-22 NOTE — Progress Notes (Signed)
   03/22/18 1400  Clinical Encounter Type  Visited With Family  Visit Type Initial  Spiritual Encounters  Spiritual Needs Emotional  Responded to [facility] consult for prayer. Patient already went to surgery. Met father and spoke with him in waiting area. Provided emotional and spiritual support.

## 2018-03-22 NOTE — Progress Notes (Signed)
  Echocardiogram 2D Echocardiogram has been performed.  Roosvelt MaserLane, Maxi Carreras F 03/22/2018, 12:52 PM

## 2018-03-22 NOTE — Progress Notes (Signed)
[facility] informed lab of inability to obtain labs from midline after assessment of line and no blood return.

## 2018-03-22 NOTE — Anesthesia Postprocedure Evaluation (Signed)
Anesthesia Post Note  Patient: Tony Long  Procedure(s) Performed: INCISION AND DRAINAGE ABSCESS (N/A ) TRANSESOPHAGEAL ECHOCARDIOGRAM (TEE)     Patient location during evaluation: PACU Anesthesia Type: General Level of consciousness: awake and alert Pain management: pain level controlled Vital Signs Assessment: post-procedure vital signs reviewed and stable Respiratory status: spontaneous breathing, nonlabored ventilation, respiratory function stable and patient connected to nasal cannula oxygen Cardiovascular status: blood pressure returned to baseline and stable Postop Assessment: no apparent nausea or vomiting Anesthetic complications: no    Last Vitals:  Vitals:   03/22/18 1440 03/22/18 1455  BP: (!) 150/82 (!) 159/84  Pulse: (!) 125 (!) 123  Resp: 15 19  Temp:  37.9 C  SpO2: 96% 95%    Last Pain:  Vitals:   03/22/18 1455  TempSrc:   PainSc: 0-No pain                 Clarisse Rodriges DAVID

## 2018-03-23 ENCOUNTER — Encounter (HOSPITAL_COMMUNITY): Payer: Self-pay | Admitting: General Surgery

## 2018-03-23 DIAGNOSIS — B182 Chronic viral hepatitis C: Secondary | ICD-10-CM

## 2018-03-23 DIAGNOSIS — I330 Acute and subacute infective endocarditis: Secondary | ICD-10-CM

## 2018-03-23 DIAGNOSIS — E119 Type 2 diabetes mellitus without complications: Secondary | ICD-10-CM

## 2018-03-23 DIAGNOSIS — F1110 Opioid abuse, uncomplicated: Secondary | ICD-10-CM

## 2018-03-23 LAB — COMPREHENSIVE METABOLIC PANEL
ALT: 7 U/L (ref 0–44)
AST: 9 U/L — ABNORMAL LOW (ref 15–41)
Albumin: 1.8 g/dL — ABNORMAL LOW (ref 3.5–5.0)
Alkaline Phosphatase: 126 U/L (ref 38–126)
Anion gap: 10 (ref 5–15)
BUN: 18 mg/dL (ref 6–20)
CALCIUM: 9 mg/dL (ref 8.9–10.3)
CO2: 22 mmol/L (ref 22–32)
CREATININE: 0.73 mg/dL (ref 0.61–1.24)
Chloride: 106 mmol/L (ref 98–111)
GFR calc non Af Amer: >60 mL/min (ref >60)
Glucose, Bld: 281 mg/dL — ABNORMAL HIGH (ref 70–99)
Potassium: 3.1 mmol/L — ABNORMAL LOW (ref 3.5–5.1)
SODIUM: 138 mmol/L (ref 135–145)
Total Bilirubin: 0.5 mg/dL (ref 0.3–1.2)
Total Protein: 6.4 g/dL — ABNORMAL LOW (ref 6.5–8.1)

## 2018-03-23 LAB — CULTURE, BLOOD (ROUTINE X 2)
SPECIAL REQUESTS: ADEQUATE
SPECIAL REQUESTS: ADEQUATE

## 2018-03-23 LAB — CBC
HCT: 34.9 % — ABNORMAL LOW (ref 39.0–52.0)
Hemoglobin: 11 g/dL — ABNORMAL LOW (ref 13.0–17.0)
MCH: 30.4 pg (ref 26.0–34.0)
MCHC: 31.5 g/dL (ref 30.0–36.0)
MCV: 96.4 fL (ref 78.0–100.0)
Platelets: 311 10*3/uL (ref 150–400)
RBC: 3.62 MIL/uL — ABNORMAL LOW (ref 4.22–5.81)
RDW: 13.3 % (ref 11.5–15.5)
WBC: 21.2 10*3/uL — ABNORMAL HIGH (ref 4.0–10.5)

## 2018-03-23 LAB — HIV ANTIBODY (ROUTINE TESTING W REFLEX): HIV Screen 4th Generation wRfx: NONREACTIVE

## 2018-03-23 LAB — MAGNESIUM: Magnesium: 1.8 mg/dL (ref 1.7–2.4)

## 2018-03-23 LAB — GLUCOSE, CAPILLARY
GLUCOSE-CAPILLARY: 169 mg/dL — AB (ref 70–99)
Glucose-Capillary: 271 mg/dL — ABNORMAL HIGH (ref 70–99)
Glucose-Capillary: 104 mg/dL — ABNORMAL HIGH (ref 70–99)
Glucose-Capillary: 181 mg/dL — ABNORMAL HIGH (ref 70–99)

## 2018-03-23 LAB — PROCALCITONIN: Procalcitonin: 0.86 ng/mL

## 2018-03-23 MED ORDER — CYCLOBENZAPRINE HCL 5 MG PO TABS
5.0000 mg | ORAL_TABLET | Freq: Three times a day (TID) | ORAL | Status: DC
  Administered 2018-03-23 – 2018-03-25 (×6): 5 mg via ORAL
  Filled 2018-03-23 (×7): qty 1

## 2018-03-23 MED ORDER — BUPRENORPHINE HCL-NALOXONE HCL 8-2 MG SL SUBL
2.0000 | SUBLINGUAL_TABLET | Freq: Two times a day (BID) | SUBLINGUAL | Status: DC
  Administered 2018-03-24 (×2): 2 via SUBLINGUAL
  Filled 2018-03-23 (×2): qty 2

## 2018-03-23 MED ORDER — LACTATED RINGERS IV SOLN
INTRAVENOUS | Status: AC
  Administered 2018-03-24: via INTRAVENOUS

## 2018-03-23 MED ORDER — OXYCODONE-ACETAMINOPHEN 5-325 MG PO TABS
1.0000 | ORAL_TABLET | Freq: Three times a day (TID) | ORAL | Status: DC | PRN
  Administered 2018-03-23 – 2018-03-24 (×4): 1 via ORAL
  Filled 2018-03-23 (×4): qty 1

## 2018-03-23 MED ORDER — HYDROMORPHONE HCL 1 MG/ML IJ SOLN: 1.0000 mg | INTRAMUSCULAR | Status: DC | PRN

## 2018-03-23 MED ORDER — METOPROLOL TARTRATE 5 MG/5ML IV SOLN: 5.0000 mg | INTRAVENOUS | Status: DC | PRN

## 2018-03-23 MED ORDER — CLONAZEPAM 0.125 MG PO TBDP
1.0000 mg | ORAL_TABLET | Freq: Two times a day (BID) | ORAL | Status: DC
  Administered 2018-03-23 – 2018-03-24 (×3): 1 mg via ORAL
  Filled 2018-03-23 (×3): qty 8

## 2018-03-23 MED ORDER — METOPROLOL TARTRATE 50 MG PO TABS
50.0000 mg | ORAL_TABLET | Freq: Two times a day (BID) | ORAL | Status: DC
  Administered 2018-03-23 – 2018-03-30 (×13): 50 mg via ORAL
  Filled 2018-03-23 (×14): qty 1

## 2018-03-23 MED ORDER — OXYCODONE-ACETAMINOPHEN 5-325 MG PO TABS: 1.0000 | ORAL_TABLET | Freq: Three times a day (TID) | ORAL | Status: DC | PRN

## 2018-03-23 MED ORDER — MAGNESIUM HYDROXIDE 400 MG/5ML PO SUSP: 30.0000 mL | Freq: Every evening | ORAL | Status: DC | PRN

## 2018-03-23 MED ORDER — INSULIN ASPART 100 UNIT/ML ~~LOC~~ SOLN
0.0000 [IU] | Freq: Three times a day (TID) | SUBCUTANEOUS | Status: DC
  Administered 2018-03-23: 4 [IU] via SUBCUTANEOUS
  Administered 2018-03-23: 12 [IU] via SUBCUTANEOUS
  Administered 2018-03-24: 2 [IU] via SUBCUTANEOUS

## 2018-03-23 MED ORDER — POLYETHYLENE GLYCOL 3350 17 G PO PACK
17.0000 g | PACK | Freq: Two times a day (BID) | ORAL | Status: DC
  Administered 2018-03-23 – 2018-03-26 (×6): 17 g via ORAL
  Filled 2018-03-23 (×37): qty 1

## 2018-03-23 NOTE — Care Management Note (Signed)
Case Management Note  Patient Details  Name: Wynelle ClevelandBrandon Mestas MRN: [id number] Date of Birth: 11/06/1975  Subjective/Objective:    MSSA bacteremia/ endocarditis, Lumbar abscesses, Opioid use disorder, severe, dependence. Hx of DM2, HTN, h/o IVDU. From home with family. Independent with ADL's, no DME usage PTA.     Carmine SavoyShirley Narvaez (Mother) Terrence DupontJim Arteaga (Father)    [phone]     PCP: Raliegh IpNatalie Stroud  Action/Plan: Pt will need long term IV ABX.... SNF placement not available 2/2 drug hx, and not able to d/c to home with IV ABX therapy as well.... NCM will continue to monitor for transition of care needs.  Expected Discharge Date:                  Expected Discharge Plan:     In-House Referral:  Clinical Social Work(substance abuse)  Discharge planning Services  CM Consult  Post Acute Care Choice:    Choice offered to:     DME Arranged:    DME Agency:     HH Arranged:    HH Agency:     Status of Service:  In process, will continue to follow  If discussed at Long Length of Stay Meetings, dates discussed:    Additional Comments:  Epifanio LeschesCole, Ossie Beltran [place], RN 03/23/2018, 2:39 PM

## 2018-03-23 NOTE — Progress Notes (Signed)
CSW received consult regarding SNF placement. Patient unable to be placed in SNF due to multiple factors and will need to remain in the hospital for the course of his treatment.   Osborne Cascoadia Phil Michels LCSW [phone]

## 2018-03-23 NOTE — Progress Notes (Signed)
Inpatient Diabetes Program Recommendations  AACE/ADA: New Consensus Statement on Inpatient Glycemic Control (2015)  Target Ranges:  Prepandial:   less than 140 mg/dL      Peak postprandial:   less than 180 mg/dL (1-2 hours)      Critically ill patients:  140 - 180 mg/dL   Lab Results  Component Value Date   GLUCAP 169 (H) 03/23/2018   HGBA1C 12.6 (A) 02/26/2018    Review of Glycemic ControlResults for Tony ClevelandKENNEDY, Tony (MRN [id number]) as of 03/23/2018 15:36  Ref. Range 03/22/2018 08:03 03/22/2018 09:06 03/22/2018 14:25 03/22/2018 16:19 03/22/2018 20:39 03/23/2018 07:39 03/23/2018 12:05  Glucose-Capillary Latest Ref Range: 70 - 99 mg/dL 91 90 409132 (H) 811157 (H) 914184 (H) 271 (H) 169 (H)   Diabetes history: DM Outpatient Diabetes medications:  Lantus 40 units q HS, Humalog 10 units tid with meals (per patient he was supposed to be taking 20 units tid with meals), Metformin 500 mg bid Current orders for Inpatient glycemic control:  TCTS Novolog tid with meals, Lantus 20 units bid Inpatient Diabetes Program Recommendations:    Please consider reducing Novolog correction to moderate tid with meals.    Spoke with patient regarding A1C.  He states that [facility]  He states he was diagnosed with DM in 2015 and has always been on insulin.  Patient admits to not taking insulin consistently and has fears about blood sugars dropping too low.  We discussed that he may need adjustments in insulin regimen prior to d/c to prevent low blood sugars and also treat high blood sugars.  Gave him handout on goal A1C, goal blood sugars, monitoring, and the importance of glycemic control.  Patient was able to teach back why glycemic control is important.  Still unclear of his compliance with DM care at home.    Thanks,  Beryl MeagerJenny Khasir Woodrome, RN, BC-ADM Inpatient Diabetes Coordinator Pager [phone] (8a-5p)

## 2018-03-23 NOTE — Progress Notes (Signed)
Subjective: Patient reports "I feel fine right now; my pain medicine is working"  Objective: Vital signs in last 24 hours: Temp:  [97.5 F (36.4 C)-101.6 F (38.7 C)] 98.7 F (37.1 C) (09/24 0454) Pulse Rate:  [110-129] 110 (09/24 0454) Resp:  [13-25] 15 (09/24 0454) BP: (117-160)/(79-109) 147/93 (09/24 0855) SpO2:  [95 %-97 %] 95 % (09/24 0454)  Intake/Output from previous day: 09/23 0701 - 09/24 0700 In: 3406.5 [P.O.:1206; I.V.:1870.8; IV Piggyback:329.7] Out: 2380 [Urine:2380] Intake/Output this shift: Total I/O In: 10 [I.V.:10] Out: -   Alert conversant and cooperative. Reporrts no pain at present. Denies numbness, tingling, noting "My feet aren't even tingling like they used to from my diabetes". Strength in full BUE and hand intrinsics; full BLE as well. Denies numbness.  Lumbar dressings not examined this visit - deferring to general surgery.  Lab Results: Recent Labs    03/22/18 0339 03/23/18 0439  WBC 25.3* 21.2*  HGB 11.2* 11.0*  HCT 35.3* 34.9*  PLT 386 311   BMET Recent Labs    03/22/18 0339 03/23/18 0439  NA 141 138  K 2.9* 3.1*  CL 107 106  CO2 21* 22  GLUCOSE 127* 281*  BUN 9 18  CREATININE 0.81 0.73  CALCIUM 9.4 9.0    Studies/Results: Mr Cervical Spine W Wo Contrast  Result Date: 03/22/2018 CLINICAL DATA:  Spine infection. EXAM: MRI TOTAL SPINE WITHOUT AND WITH CONTRAST TECHNIQUE: Multisequence MR imaging of the spine from the cervical spine to the sacrum was performed prior to and following IV contrast administration. CONTRAST:  6 cc Gadavist intravenous COMPARISON:  CT of the thoracic and lumbar spine from 2 days ago FINDINGS: MRI CERVICAL SPINE FINDINGS Alignment: Normal Vertebrae: No evidence of osseous infection. Canal/Cord: There is extensive spinal fluid collection preferentially in the ventral but also in the right more than left dorsal canal. Subarachnoid space is diffusely effaced. Maximal thickness is posterior to C2 at 9 mm. No cord  signal abnormality. Posterior Fossa, vertebral arteries, paraspinal tissues: No retropharyngeal or other discrete soft tissue collection. Disc levels: C2-3: Unremarkable. C3-4: Small left foraminal protrusion with moderate narrowing C4-5: Unremarkable. C5-6: Disc narrowing and bulging with asymmetric left uncovertebral spurring. Left foraminal impingement C6-7: Right foraminal protrusion mild narrowing. C7-T1:Unremarkable. MRI THORACIC SPINE FINDINGS Alignment:  Normal Vertebrae: No evidence of osteomyelitis or discitis. T12 butterfly vertebra. Canal/Cord: Cervical ventral epidural collection continues throughout the thoracic levels. There is also a focal dorsal component at T3-4 to T5-6, where thecal sac effacement is accentuated and there is cord flattening. No cord edema. Paraspinal and other soft tissues: Paraspinous phlegmon on the left at T8-T12, with new complex left pleural effusion and lower lobe atelectasis Disc levels: T5-6 central disc protrusion. MRI LUMBAR SPINE FINDINGS Segmentation:  5 lumbar type vertebral bodies Alignment:  Grade 1 anterolisthesis at L5-S1. Vertebrae: Marrow edema and heterogeneous enhancement within the L2, L3, and L4 spinous processes. No discitis or facet edema. Conus medullaris: Extends to the L1 level and is non edematous. There is extensive epidural collection completely effacing the thecal sac throughout the lumbar spine until L4-5 and below where the collection becomes ventral and right eccentric. The infection communicates with extensive bilateral abscess within the intrinsic back muscles via the interspinous space at L3-4. Patient had recent subcutaneous collection and left buttocks collection drainage, with packing seen in place. This midline, upper subcutaneous collection communicates with the paravertebral abscess along its superior margin based on postcontrast axial images. Paraspinal and other soft tissues: As above.  Distended bladder Disc levels: Chronic bilateral  pars defects at L5. Critical Value/emergent results were called by telephone at the time of interpretation on 03/22/2018 at 3:04 pm to Dr. Thedore Mins , who verbally acknowledged these results. IMPRESSION: 1. Epidural abscess from C2 to sacrum as described. Maximal cord compression from T3-4 to T5-6. The thecal sac is completely effaced from L1 to L4-5. At the L3-4 interspinous space the spinal abscess communicates with large bilateral abscesses within the intrinsic back muscles. There is osteomyelitis of the L2, L3, and L4 spinous processes. No discitis or facet arthritis. 2. Left paravertebral abscess along the lower thoracic spine with small left empyema that is new from CT 2 days ago. 3. Distended bladder Electronically Signed   By: Marnee Spring M.D.   On: 03/22/2018 15:11   Mr Thoracic Spine W Wo Contrast  Result Date: 03/22/2018 CLINICAL DATA:  Spine infection. EXAM: MRI TOTAL SPINE WITHOUT AND WITH CONTRAST TECHNIQUE: Multisequence MR imaging of the spine from the cervical spine to the sacrum was performed prior to and following IV contrast administration. CONTRAST:  6 cc Gadavist intravenous COMPARISON:  CT of the thoracic and lumbar spine from 2 days ago FINDINGS: MRI CERVICAL SPINE FINDINGS Alignment: Normal Vertebrae: No evidence of osseous infection. Canal/Cord: There is extensive spinal fluid collection preferentially in the ventral but also in the right more than left dorsal canal. Subarachnoid space is diffusely effaced. Maximal thickness is posterior to C2 at 9 mm. No cord signal abnormality. Posterior Fossa, vertebral arteries, paraspinal tissues: No retropharyngeal or other discrete soft tissue collection. Disc levels: C2-3: Unremarkable. C3-4: Small left foraminal protrusion with moderate narrowing C4-5: Unremarkable. C5-6: Disc narrowing and bulging with asymmetric left uncovertebral spurring. Left foraminal impingement C6-7: Right foraminal protrusion mild narrowing. C7-T1:Unremarkable. MRI  THORACIC SPINE FINDINGS Alignment:  Normal Vertebrae: No evidence of osteomyelitis or discitis. T12 butterfly vertebra. Canal/Cord: Cervical ventral epidural collection continues throughout the thoracic levels. There is also a focal dorsal component at T3-4 to T5-6, where thecal sac effacement is accentuated and there is cord flattening. No cord edema. Paraspinal and other soft tissues: Paraspinous phlegmon on the left at T8-T12, with new complex left pleural effusion and lower lobe atelectasis Disc levels: T5-6 central disc protrusion. MRI LUMBAR SPINE FINDINGS Segmentation:  5 lumbar type vertebral bodies Alignment:  Grade 1 anterolisthesis at L5-S1. Vertebrae: Marrow edema and heterogeneous enhancement within the L2, L3, and L4 spinous processes. No discitis or facet edema. Conus medullaris: Extends to the L1 level and is non edematous. There is extensive epidural collection completely effacing the thecal sac throughout the lumbar spine until L4-5 and below where the collection becomes ventral and right eccentric. The infection communicates with extensive bilateral abscess within the intrinsic back muscles via the interspinous space at L3-4. Patient had recent subcutaneous collection and left buttocks collection drainage, with packing seen in place. This midline, upper subcutaneous collection communicates with the paravertebral abscess along its superior margin based on postcontrast axial images. Paraspinal and other soft tissues: As above.  Distended bladder Disc levels: Chronic bilateral pars defects at L5. Critical Value/emergent results were called by telephone at the time of interpretation on 03/22/2018 at 3:04 pm to Dr. Thedore Mins , who verbally acknowledged these results. IMPRESSION: 1. Epidural abscess from C2 to sacrum as described. Maximal cord compression from T3-4 to T5-6. The thecal sac is completely effaced from L1 to L4-5. At the L3-4 interspinous space the spinal abscess communicates with large  bilateral abscesses within the intrinsic back muscles.  There is osteomyelitis of the L2, L3, and L4 spinous processes. No discitis or facet arthritis. 2. Left paravertebral abscess along the lower thoracic spine with small left empyema that is new from CT 2 days ago. 3. Distended bladder Electronically Signed   By: Marnee Spring M.D.   On: 03/22/2018 15:11   Mr Lumbar Spine W Wo Contrast  Result Date: 03/22/2018 CLINICAL DATA:  Spine infection. EXAM: MRI TOTAL SPINE WITHOUT AND WITH CONTRAST TECHNIQUE: Multisequence MR imaging of the spine from the cervical spine to the sacrum was performed prior to and following IV contrast administration. CONTRAST:  6 cc Gadavist intravenous COMPARISON:  CT of the thoracic and lumbar spine from 2 days ago FINDINGS: MRI CERVICAL SPINE FINDINGS Alignment: Normal Vertebrae: No evidence of osseous infection. Canal/Cord: There is extensive spinal fluid collection preferentially in the ventral but also in the right more than left dorsal canal. Subarachnoid space is diffusely effaced. Maximal thickness is posterior to C2 at 9 mm. No cord signal abnormality. Posterior Fossa, vertebral arteries, paraspinal tissues: No retropharyngeal or other discrete soft tissue collection. Disc levels: C2-3: Unremarkable. C3-4: Small left foraminal protrusion with moderate narrowing C4-5: Unremarkable. C5-6: Disc narrowing and bulging with asymmetric left uncovertebral spurring. Left foraminal impingement C6-7: Right foraminal protrusion mild narrowing. C7-T1:Unremarkable. MRI THORACIC SPINE FINDINGS Alignment:  Normal Vertebrae: No evidence of osteomyelitis or discitis. T12 butterfly vertebra. Canal/Cord: Cervical ventral epidural collection continues throughout the thoracic levels. There is also a focal dorsal component at T3-4 to T5-6, where thecal sac effacement is accentuated and there is cord flattening. No cord edema. Paraspinal and other soft tissues: Paraspinous phlegmon on the left at  T8-T12, with new complex left pleural effusion and lower lobe atelectasis Disc levels: T5-6 central disc protrusion. MRI LUMBAR SPINE FINDINGS Segmentation:  5 lumbar type vertebral bodies Alignment:  Grade 1 anterolisthesis at L5-S1. Vertebrae: Marrow edema and heterogeneous enhancement within the L2, L3, and L4 spinous processes. No discitis or facet edema. Conus medullaris: Extends to the L1 level and is non edematous. There is extensive epidural collection completely effacing the thecal sac throughout the lumbar spine until L4-5 and below where the collection becomes ventral and right eccentric. The infection communicates with extensive bilateral abscess within the intrinsic back muscles via the interspinous space at L3-4. Patient had recent subcutaneous collection and left buttocks collection drainage, with packing seen in place. This midline, upper subcutaneous collection communicates with the paravertebral abscess along its superior margin based on postcontrast axial images. Paraspinal and other soft tissues: As above.  Distended bladder Disc levels: Chronic bilateral pars defects at L5. Critical Value/emergent results were called by telephone at the time of interpretation on 03/22/2018 at 3:04 pm to Dr. Thedore Mins , who verbally acknowledged these results. IMPRESSION: 1. Epidural abscess from C2 to sacrum as described. Maximal cord compression from T3-4 to T5-6. The thecal sac is completely effaced from L1 to L4-5. At the L3-4 interspinous space the spinal abscess communicates with large bilateral abscesses within the intrinsic back muscles. There is osteomyelitis of the L2, L3, and L4 spinous processes. No discitis or facet arthritis. 2. Left paravertebral abscess along the lower thoracic spine with small left empyema that is new from CT 2 days ago. 3. Distended bladder Electronically Signed   By: Marnee Spring M.D.   On: 03/22/2018 15:11    Assessment/Plan:   LOS: 3 days  Discussed with pt plan for NS to  follow with expectation of IVAB to treat extensive epidural spinal abscess,  hopefully without need for surgical intervention.    Georgiann Cockeroteat, Herrick Hartog 03/23/2018, 10:58 AM

## 2018-03-23 NOTE — Progress Notes (Signed)
Physical Therapy Wound Evaluation/Treatment Patient Details  Name: Tony Long MRN: [id number] Date of Birth: 1975/11/23  Today's Date: 03/23/2018 Time: 1250-1350 Time Calculation (min): 60 min  Subjective  Subjective: Pt pleasant and agreeable to hydrotherapy Patient and Family Stated Goals: Heal wound Prior Treatments: 03/22/18 I&D lumbar wounds  Pain Score: Pt premedicated and tolerated well.   Wound Assessment  Wound / Incision (Open or Dehisced) 03/23/18 Incision - Open Lumbar Upper (Active)  Wound Image   03/23/2018  2:19 PM  Dressing Type ABD;Barrier Film (skin prep);Gauze (Comment);Moist to dry 03/23/2018  2:19 PM  Dressing Changed Changed 03/23/2018  2:19 PM  Dressing Status Clean;Dry;Intact 03/23/2018  2:19 PM  Dressing Change Frequency Daily 03/23/2018  2:19 PM  Site / Wound Assessment Yellow;Pink;Pale 03/23/2018  2:19 PM  % Wound base Red or Granulating 50% 03/23/2018  2:19 PM  % Wound base Yellow/Fibrinous Exudate 50% 03/23/2018  2:19 PM  % Wound base Black/Eschar 0% 03/23/2018  2:19 PM  % Wound base Other/Granulation Tissue (Comment) 0% 03/23/2018  2:19 PM  Peri-wound Assessment Pink;Induration 03/23/2018  2:19 PM  Wound Length (cm) 3 cm 03/23/2018  1:00 PM  Wound Width (cm) 1.7 cm 03/23/2018  1:00 PM  Wound Depth (cm) 1.4 cm 03/23/2018  1:00 PM  Wound Volume (cm^3) 7.14 cm^3 03/23/2018  1:00 PM  Wound Surface Area (cm^2) 5.1 cm^2 03/23/2018  1:00 PM  Undermining (cm) 3.5cm 11-12 o'clock; 4.1cm 12-1 o'clock; 2.6cm 3'oclock;2.5cm 5 o'clock; 2.9cm 6 o'clock; 2.5cm 7 o'clock 03/23/2018  1:00 PM  Margins Unattached edges (unapproximated) 03/23/2018  2:19 PM  Closure None 03/23/2018  2:19 PM  Drainage Amount Moderate 03/23/2018  2:19 PM  Drainage Description Purulent;No odor 03/23/2018  2:19 PM  Treatment Debridement (Selective);Hydrotherapy (Pulse lavage);Packing (Saline gauze) 03/23/2018  2:19 PM     Wound / Incision (Open or Dehisced) 03/23/18 Incision - Open Lumbar Lower (Active)    Wound Image   03/23/2018  2:19 PM  Dressing Type ABD;Barrier Film (skin prep);Gauze (Comment);Moist to dry 03/23/2018  2:19 PM  Dressing Changed Changed 03/23/2018  2:19 PM  Dressing Status Clean;Dry;Intact 03/23/2018  2:19 PM  Dressing Change Frequency Daily 03/23/2018  2:19 PM  Site / Wound Assessment Yellow;Pink 03/23/2018  2:19 PM  % Wound base Red or Granulating 30% 03/23/2018  2:19 PM  % Wound base Yellow/Fibrinous Exudate 70% 03/23/2018  2:19 PM  % Wound base Black/Eschar 0% 03/23/2018  2:19 PM  % Wound base Other/Granulation Tissue (Comment) 0% 03/23/2018  2:19 PM  Peri-wound Assessment Pink;Induration 03/23/2018  2:19 PM  Wound Length (cm) 3.3 cm 03/23/2018  1:00 PM  Wound Width (cm) 1.8 cm 03/23/2018  1:00 PM  Wound Depth (cm) 1.5 cm 03/23/2018  1:00 PM  Wound Volume (cm^3) 8.91 cm^3 03/23/2018  1:00 PM  Wound Surface Area (cm^2) 5.94 cm^2 03/23/2018  1:00 PM  Undermining (cm) 2.8cm 12 o'clock; 3.0cm 3 o'clock; 2.2cm 3-6 o'clock;  4.5cm 9-10 o'clock; 4.8cm 11 o'clock 03/23/2018  1:00 PM  Margins Unattached edges (unapproximated) 03/23/2018  2:19 PM  Closure None 03/23/2018  2:19 PM  Drainage Amount Moderate 03/23/2018  2:19 PM  Drainage Description Purulent;No odor 03/23/2018  2:19 PM  Treatment Debridement (Selective);Hydrotherapy (Pulse lavage);Packing (Saline gauze) 03/23/2018  2:19 PM   Hydrotherapy Pulsed lavage therapy - wound location: x2 lumbar wounds Pulsed Lavage with Suction (psi): 12 psi Pulsed Lavage with Suction - Normal Saline Used: 2000 mL Pulsed Lavage Tip: Tip with splash shield Selective Debridement Selective Debridement - Location: x2 lumbar wounds  Selective Debridement - Tools Used: Forceps;Scissors Selective Debridement - Tissue Removed: yellow necrotic/unviable tissue   Wound Assessment and Plan  Wound Therapy - Assess/Plan/Recommendations Wound Therapy - Clinical Statement: Pt presents to hydrotherapy s/p I&D of x2 lumbar wounds. Noted after pulsed lavage both wounds  had an increase in purulent drainage. Noted blanchable redness at sacrum and pt reports area is tender. Pink foam dressing placed prophylactically and RN notified. This patient will benefit from continued hydrotherapy for selective removal of unviable tissue, decrease bioburden, and promote wound bed healing.  Wound Therapy - Functional Problem List: Acute pain, decreased tolerance for position changes Factors Delaying/Impairing Wound Healing: Diabetes Mellitus;Infection - systemic/local Hydrotherapy Plan: Debridement;Dressing change;Patient/family education;Pulsatile lavage with suction Wound Therapy - Frequency: 6X / week Wound Therapy - Follow Up Recommendations: Home health RN Wound Plan: See above  Wound Therapy Goals- Improve the function of patient's integumentary system by progressing the wound(s) through the phases of wound healing (inflammation - proliferation - remodeling) by: Decrease Necrotic Tissue to: 0% both wounds Decrease Necrotic Tissue - Progress: Goal set today Increase Granulation Tissue to: 100% both wounds Increase Granulation Tissue - Progress: Goal set today Improve Drainage Characteristics: Min;Serous Improve Drainage Characteristics - Progress: Goal set today Goals/treatment plan/discharge plan were made with and agreed upon by patient/family: Yes Time For Goal Achievement: 7 days Wound Therapy - Potential for Goals: Excellent  Goals will be updated until maximal potential achieved or discharge criteria met.  Discharge criteria: when goals achieved, discharge from hospital, MD decision/surgical intervention, no progress towards goals, refusal/missing three consecutive treatments without notification or medical reason.  GP     Thelma Comp 03/23/2018, 2:42 PM  Rolinda Roan, PT, DPT Acute Rehabilitation Services Pager: [phone] Office: [phone]

## 2018-03-23 NOTE — Progress Notes (Signed)
INFECTIOUS DISEASE PROGRESS NOTE  ID: Tony Long is a 42 y.o. male with  Principal Problem:   Opioid use disorder, severe, dependence (HCC) Active Problems:   Non compliance w medication regimen   Chronic hepatitis C without hepatic coma (HCC)   Osteomyelitis (HCC)   Back abscess   Bacteremia due to Gram-positive bacteria  Subjective: No complaints Normal urine, no loss of sensation or weakness  Abtx:  Anti-infectives (From admission, onward)   Start     Dose/Rate Route Frequency Ordered Stop   03/21/18 0930  ceFAZolin (ANCEF) IVPB 2g/100 mL premix     2 g 200 mL/hr over 30 Minutes Intravenous Every 8 hours 03/21/18 0920        Medications:  Scheduled: . buprenorphine-naloxone  1 tablet Sublingual BID  . cloNIDine  0.2 mg Oral TID  . gabapentin  300 mg Oral TID  . heparin injection (subcutaneous)  5,000 Units Subcutaneous Q8H  . insulin aspart  0-24 Units Subcutaneous TID WC  . insulin glargine  20 Units Subcutaneous BID  . LORazepam  1 mg Oral Once  . nicotine  21 mg Transdermal Daily  . polyethylene glycol  17 g Oral BID  . potassium chloride  40 mEq Oral Once  . sodium chloride flush  10-40 mL Intracatheter Q12H    Objective: Vital signs in last 24 hours: Temp:  [97.5 F (36.4 C)-101.6 F (38.7 C)] 98.7 F (37.1 C) (09/24 0454) Pulse Rate:  [110-129] 110 (09/24 0454) Resp:  [13-25] 15 (09/24 0454) BP: (117-160)/(79-109) 147/93 (09/24 0855) SpO2:  [95 %-97 %] 95 % (09/24 0454)   General appearance: alert, cooperative and no distress Resp: clear to auscultation bilaterally Cardio: regular rate and rhythm GI: normal findings: bowel sounds normal and soft, non-tender Extremities: edema none Neurologic: Grossly normal  Lab Results Recent Labs    03/22/18 0339 03/23/18 0439  WBC 25.3* 21.2*  HGB 11.2* 11.0*  HCT 35.3* 34.9*  NA 141 138  K 2.9* 3.1*  CL 107 106  CO2 21* 22  BUN 9 18  CREATININE 0.81 0.73   Liver Panel Recent Labs   03/22/18 0339 03/23/18 0439  PROT 7.0 6.4*  ALBUMIN 2.0* 1.8*  AST 9* 9*  ALT 8 7  ALKPHOS 154* 126  BILITOT 0.8 0.5   Sedimentation Rate No results for input(s): ESRSEDRATE in the last 72 hours. C-Reactive Protein No results for input(s): CRP in the last 72 hours.  Microbiology: Recent Results (from the past 240 hour(s))  Blood Culture (routine x 2)     Status: Abnormal   Collection Time: 03/20/18  7:59 AM  Result Value Ref Range Status   Specimen Description   Final    BLOOD EJ Performed at [facility], [address], [place], [place] [address]    Special Requests   Final    BOTTLES DRAWN AEROBIC AND ANAEROBIC Blood Culture adequate volume Performed at [facility], [address], [place], [place] [address]    Culture  Setup Time   Final    GRAM POSITIVE COCCI IN BOTH AEROBIC AND ANAEROBIC BOTTLES CRITICAL RESULT CALLED TO, READ BACK BY AND VERIFIED WITH: MATT MCBANE ON 03/21/18 AT 0001 QSD Performed at [facility], [address] [place], [place] [address]    Culture (A)  Final    STAPHYLOCOCCUS AUREUS SUSCEPTIBILITIES PERFORMED ON PREVIOUS CULTURE WITHIN THE LAST 5 DAYS. Performed at [name], [address], [place], [place] [address]  Report Status 03/23/2018 FINAL  Final  Blood Culture (routine x 2)     Status: Abnormal   Collection Time: 03/20/18  7:59 AM  Result Value Ref Range Status   Specimen Description   Final    BLOOD EJ Performed at [facility], [address] [place], [place] [address]    Special Requests   Final    BOTTLES DRAWN AEROBIC AND ANAEROBIC Blood Culture adequate volume Performed at [facility], [address] [place], [place] [address]    Culture  Setup Time   Final    GRAM POSITIVE COCCI IN BOTH AEROBIC AND ANAEROBIC BOTTLES CRITICAL RESULT CALLED TO, READ BACK BY AND VERIFIED WITH: MATT MCBANE ON 03/21/18 AT 0001 QSD Performed at [facility], [address] [place], [place] [address]    Culture STAPHYLOCOCCUS AUREUS (A)  Final   Report Status 03/23/2018 FINAL  Final   Organism ID, Bacteria STAPHYLOCOCCUS AUREUS  Final      Susceptibility   Staphylococcus aureus - MIC*    CIPROFLOXACIN <=0.5 SENSITIVE Sensitive     ERYTHROMYCIN <=0.25 SENSITIVE Sensitive     GENTAMICIN <=0.5 SENSITIVE Sensitive     OXACILLIN <=0.25 SENSITIVE Sensitive     TETRACYCLINE <=1 SENSITIVE Sensitive     VANCOMYCIN <=0.5 SENSITIVE Sensitive     TRIMETH/SULFA <=10 SENSITIVE Sensitive     CLINDAMYCIN <=0.25 SENSITIVE Sensitive     RIFAMPIN <=0.5 SENSITIVE Sensitive     Inducible Clindamycin NEGATIVE Sensitive     * STAPHYLOCOCCUS AUREUS  Blood Culture ID Panel (Reflexed)     Status: Abnormal   Collection Time: 03/20/18  7:59 AM  Result Value Ref Range Status   Enterococcus species NOT DETECTED NOT DETECTED Final   Listeria monocytogenes NOT DETECTED NOT DETECTED Final   Staphylococcus species DETECTED (A) NOT DETECTED Final    Comment: CRITICAL RESULT CALLED TO, READ BACK BY AND VERIFIED WITH: MATT MCBANE ON 03/21/18 AT 0001 QSD    Staphylococcus aureus DETECTED (A) NOT DETECTED Final    Comment: Methicillin (oxacillin) susceptible Staphylococcus aureus (MSSA). Preferred therapy is anti staphylococcal beta lactam antibiotic (Cefazolin or Nafcillin), unless clinically contraindicated. CRITICAL RESULT CALLED TO, READ BACK BY AND VERIFIED WITH: MATT MCBANE ON 03/21/18 AT 0001 QSD    Methicillin resistance NOT DETECTED NOT DETECTED Final   Streptococcus species NOT DETECTED NOT DETECTED Final   Streptococcus agalactiae NOT DETECTED NOT DETECTED Final   Streptococcus pneumoniae NOT DETECTED NOT DETECTED Final   Streptococcus pyogenes NOT DETECTED NOT DETECTED Final   Acinetobacter baumannii NOT DETECTED NOT DETECTED Final   Enterobacteriaceae species NOT DETECTED NOT DETECTED Final   Enterobacter cloacae complex NOT DETECTED NOT DETECTED Final   Escherichia coli  NOT DETECTED NOT DETECTED Final   Klebsiella oxytoca NOT DETECTED NOT DETECTED Final   Klebsiella pneumoniae NOT DETECTED NOT DETECTED Final   Proteus species NOT DETECTED NOT DETECTED Final   Serratia marcescens NOT DETECTED NOT DETECTED Final   Haemophilus influenzae NOT DETECTED NOT DETECTED Final   Neisseria meningitidis NOT DETECTED NOT DETECTED Final   Pseudomonas aeruginosa NOT DETECTED NOT DETECTED Final   Candida albicans NOT DETECTED NOT DETECTED Final   Candida glabrata NOT DETECTED NOT DETECTED Final   Candida krusei NOT DETECTED NOT DETECTED Final   Candida parapsilosis NOT DETECTED NOT DETECTED Final   Candida tropicalis NOT DETECTED NOT DETECTED Final    Comment: Performed at [facility], [address] [place], [place] [address]  MRSA  PCR Screening     Status: None   Collection Time: 03/20/18  3:50 PM  Result Value Ref Range Status   MRSA by PCR NEGATIVE NEGATIVE Final    Comment:        The GeneXpert MRSA Assay (FDA approved for NASAL specimens only), is one component of a comprehensive MRSA colonization surveillance program. It is not intended to diagnose MRSA infection nor to guide or monitor treatment for MRSA infections. Performed at [name], [address], [place], [place] [address]   Aerobic Culture (superficial specimen)     Status: None (Preliminary result)   Collection Time: 03/22/18 10:36 AM  Result Value Ref Range Status   Specimen Description ABSCESS BACK  Final   Special Requests NONE  Final   Gram Stain   Final    FEW WBC PRESENT,BOTH PMN AND MONONUCLEAR MODERATE GRAM POSITIVE COCCI Performed at [facility], [address] [place], [place] [address]    Culture ABUNDANT STAPHYLOCOCCUS AUREUS  Final   Report Status PENDING  Incomplete    Studies/Results: Mr Cervical Spine W Wo Contrast  Result Date: 03/22/2018 CLINICAL DATA:  Spine infection. EXAM: MRI TOTAL SPINE WITHOUT AND WITH CONTRAST TECHNIQUE:  Multisequence MR imaging of the spine from the cervical spine to the sacrum was performed prior to and following IV contrast administration. CONTRAST:  6 cc Gadavist intravenous COMPARISON:  CT of the thoracic and lumbar spine from 2 days ago FINDINGS: MRI CERVICAL SPINE FINDINGS Alignment: Normal Vertebrae: No evidence of osseous infection. Canal/Cord: There is extensive spinal fluid collection preferentially in the ventral but also in the right more than left dorsal canal. Subarachnoid space is diffusely effaced. Maximal thickness is posterior to C2 at 9 mm. No cord signal abnormality. Posterior Fossa, vertebral arteries, paraspinal tissues: No retropharyngeal or other discrete soft tissue collection. Disc levels: C2-3: Unremarkable. C3-4: Small left foraminal protrusion with moderate narrowing C4-5: Unremarkable. C5-6: Disc narrowing and bulging with asymmetric left uncovertebral spurring. Left foraminal impingement C6-7: Right foraminal protrusion mild narrowing. C7-T1:Unremarkable. MRI THORACIC SPINE FINDINGS Alignment:  Normal Vertebrae: No evidence of osteomyelitis or discitis. T12 butterfly vertebra. Canal/Cord: Cervical ventral epidural collection continues throughout the thoracic levels. There is also a focal dorsal component at T3-4 to T5-6, where thecal sac effacement is accentuated and there is cord flattening. No cord edema. Paraspinal and other soft tissues: Paraspinous phlegmon on the left at T8-T12, with new complex left pleural effusion and lower lobe atelectasis Disc levels: T5-6 central disc protrusion. MRI LUMBAR SPINE FINDINGS Segmentation:  5 lumbar type vertebral bodies Alignment:  Grade 1 anterolisthesis at L5-S1. Vertebrae: Marrow edema and heterogeneous enhancement within the L2, L3, and L4 spinous processes. No discitis or facet edema. Conus medullaris: Extends to the L1 level and is non edematous. There is extensive epidural collection completely effacing the thecal sac throughout the  lumbar spine until L4-5 and below where the collection becomes ventral and right eccentric. The infection communicates with extensive bilateral abscess within the intrinsic back muscles via the interspinous space at L3-4. Patient had recent subcutaneous collection and left buttocks collection drainage, with packing seen in place. This midline, upper subcutaneous collection communicates with the paravertebral abscess along its superior margin based on postcontrast axial images. Paraspinal and other soft tissues: As above.  Distended bladder Disc levels: Chronic bilateral pars defects at L5. Critical Value/emergent results were called by telephone at the time of interpretation on 03/22/2018 at 3:04 pm to Dr. Thedore Mins , who verbally  acknowledged these results. IMPRESSION: 1. Epidural abscess from C2 to sacrum as described. Maximal cord compression from T3-4 to T5-6. The thecal sac is completely effaced from L1 to L4-5. At the L3-4 interspinous space the spinal abscess communicates with large bilateral abscesses within the intrinsic back muscles. There is osteomyelitis of the L2, L3, and L4 spinous processes. No discitis or facet arthritis. 2. Left paravertebral abscess along the lower thoracic spine with small left empyema that is new from CT 2 days ago. 3. Distended bladder Electronically Signed   By: Marnee Spring M.D.   On: 03/22/2018 15:11   Mr Thoracic Spine W Wo Contrast  Result Date: 03/22/2018 CLINICAL DATA:  Spine infection. EXAM: MRI TOTAL SPINE WITHOUT AND WITH CONTRAST TECHNIQUE: Multisequence MR imaging of the spine from the cervical spine to the sacrum was performed prior to and following IV contrast administration. CONTRAST:  6 cc Gadavist intravenous COMPARISON:  CT of the thoracic and lumbar spine from 2 days ago FINDINGS: MRI CERVICAL SPINE FINDINGS Alignment: Normal Vertebrae: No evidence of osseous infection. Canal/Cord: There is extensive spinal fluid collection preferentially in the ventral but  also in the right more than left dorsal canal. Subarachnoid space is diffusely effaced. Maximal thickness is posterior to C2 at 9 mm. No cord signal abnormality. Posterior Fossa, vertebral arteries, paraspinal tissues: No retropharyngeal or other discrete soft tissue collection. Disc levels: C2-3: Unremarkable. C3-4: Small left foraminal protrusion with moderate narrowing C4-5: Unremarkable. C5-6: Disc narrowing and bulging with asymmetric left uncovertebral spurring. Left foraminal impingement C6-7: Right foraminal protrusion mild narrowing. C7-T1:Unremarkable. MRI THORACIC SPINE FINDINGS Alignment:  Normal Vertebrae: No evidence of osteomyelitis or discitis. T12 butterfly vertebra. Canal/Cord: Cervical ventral epidural collection continues throughout the thoracic levels. There is also a focal dorsal component at T3-4 to T5-6, where thecal sac effacement is accentuated and there is cord flattening. No cord edema. Paraspinal and other soft tissues: Paraspinous phlegmon on the left at T8-T12, with new complex left pleural effusion and lower lobe atelectasis Disc levels: T5-6 central disc protrusion. MRI LUMBAR SPINE FINDINGS Segmentation:  5 lumbar type vertebral bodies Alignment:  Grade 1 anterolisthesis at L5-S1. Vertebrae: Marrow edema and heterogeneous enhancement within the L2, L3, and L4 spinous processes. No discitis or facet edema. Conus medullaris: Extends to the L1 level and is non edematous. There is extensive epidural collection completely effacing the thecal sac throughout the lumbar spine until L4-5 and below where the collection becomes ventral and right eccentric. The infection communicates with extensive bilateral abscess within the intrinsic back muscles via the interspinous space at L3-4. Patient had recent subcutaneous collection and left buttocks collection drainage, with packing seen in place. This midline, upper subcutaneous collection communicates with the paravertebral abscess along its  superior margin based on postcontrast axial images. Paraspinal and other soft tissues: As above.  Distended bladder Disc levels: Chronic bilateral pars defects at L5. Critical Value/emergent results were called by telephone at the time of interpretation on 03/22/2018 at 3:04 pm to Dr. Thedore Mins , who verbally acknowledged these results. IMPRESSION: 1. Epidural abscess from C2 to sacrum as described. Maximal cord compression from T3-4 to T5-6. The thecal sac is completely effaced from L1 to L4-5. At the L3-4 interspinous space the spinal abscess communicates with large bilateral abscesses within the intrinsic back muscles. There is osteomyelitis of the L2, L3, and L4 spinous processes. No discitis or facet arthritis. 2. Left paravertebral abscess along the lower thoracic spine with small left empyema that is new from CT  2 days ago. 3. Distended bladder Electronically Signed   By: Marnee Spring M.D.   On: 03/22/2018 15:11   Mr Lumbar Spine W Wo Contrast  Result Date: 03/22/2018 CLINICAL DATA:  Spine infection. EXAM: MRI TOTAL SPINE WITHOUT AND WITH CONTRAST TECHNIQUE: Multisequence MR imaging of the spine from the cervical spine to the sacrum was performed prior to and following IV contrast administration. CONTRAST:  6 cc Gadavist intravenous COMPARISON:  CT of the thoracic and lumbar spine from 2 days ago FINDINGS: MRI CERVICAL SPINE FINDINGS Alignment: Normal Vertebrae: No evidence of osseous infection. Canal/Cord: There is extensive spinal fluid collection preferentially in the ventral but also in the right more than left dorsal canal. Subarachnoid space is diffusely effaced. Maximal thickness is posterior to C2 at 9 mm. No cord signal abnormality. Posterior Fossa, vertebral arteries, paraspinal tissues: No retropharyngeal or other discrete soft tissue collection. Disc levels: C2-3: Unremarkable. C3-4: Small left foraminal protrusion with moderate narrowing C4-5: Unremarkable. C5-6: Disc narrowing and bulging with  asymmetric left uncovertebral spurring. Left foraminal impingement C6-7: Right foraminal protrusion mild narrowing. C7-T1:Unremarkable. MRI THORACIC SPINE FINDINGS Alignment:  Normal Vertebrae: No evidence of osteomyelitis or discitis. T12 butterfly vertebra. Canal/Cord: Cervical ventral epidural collection continues throughout the thoracic levels. There is also a focal dorsal component at T3-4 to T5-6, where thecal sac effacement is accentuated and there is cord flattening. No cord edema. Paraspinal and other soft tissues: Paraspinous phlegmon on the left at T8-T12, with new complex left pleural effusion and lower lobe atelectasis Disc levels: T5-6 central disc protrusion. MRI LUMBAR SPINE FINDINGS Segmentation:  5 lumbar type vertebral bodies Alignment:  Grade 1 anterolisthesis at L5-S1. Vertebrae: Marrow edema and heterogeneous enhancement within the L2, L3, and L4 spinous processes. No discitis or facet edema. Conus medullaris: Extends to the L1 level and is non edematous. There is extensive epidural collection completely effacing the thecal sac throughout the lumbar spine until L4-5 and below where the collection becomes ventral and right eccentric. The infection communicates with extensive bilateral abscess within the intrinsic back muscles via the interspinous space at L3-4. Patient had recent subcutaneous collection and left buttocks collection drainage, with packing seen in place. This midline, upper subcutaneous collection communicates with the paravertebral abscess along its superior margin based on postcontrast axial images. Paraspinal and other soft tissues: As above.  Distended bladder Disc levels: Chronic bilateral pars defects at L5. Critical Value/emergent results were called by telephone at the time of interpretation on 03/22/2018 at 3:04 pm to Dr. Thedore Mins , who verbally acknowledged these results. IMPRESSION: 1. Epidural abscess from C2 to sacrum as described. Maximal cord compression from T3-4 to  T5-6. The thecal sac is completely effaced from L1 to L4-5. At the L3-4 interspinous space the spinal abscess communicates with large bilateral abscesses within the intrinsic back muscles. There is osteomyelitis of the L2, L3, and L4 spinous processes. No discitis or facet arthritis. 2. Left paravertebral abscess along the lower thoracic spine with small left empyema that is new from CT 2 days ago. 3. Distended bladder Electronically Signed   By: Marnee Spring M.D.   On: 03/22/2018 15:11     Assessment/Plan: C2- sacral spinal abscess MSSA bacteremia TV IE IVDA DM Hep C (treated?)  Total days of antibiotics: 3 (ancef)  Recheck Hep C RNA (sent 9-23) Continue ancef Continue to follow neuro exam He may needs SNF or other placement with his extent of disease and need for prolonged IV.  Johny Sax MD, FACP Infectious Diseases (pager) [phone] [email or web address] 03/23/2018, 10:59 AM  LOS: 3 days

## 2018-03-23 NOTE — Progress Notes (Signed)
Patient ID: Tony Long, male   DOB: October 28, 1975, 42 y.o.   MRN: [id number]    1 Day Post-Op  Subjective: Patient with no new complaints.  RN had to change ABD due to purulent saturation overnight.  Objective: Vital signs in last 24 hours: Temp:  [97.5 F (36.4 C)-101.6 F (38.7 C)] 98.7 F (37.1 C) (09/24 0454) Pulse Rate:  [110-129] 110 (09/24 0454) Resp:  [13-25] 15 (09/24 0454) BP: (117-160)/(79-109) 147/93 (09/24 0855) SpO2:  [95 %-97 %] 95 % (09/24 0454) Last BM Date: (PTA)  Intake/Output from previous day: 09/23 0701 - 09/24 0700 In: 3406.5 [P.O.:1206; I.V.:1870.8; IV Piggyback:329.7] Out: 2380 [Urine:2380] Intake/Output this shift: Total I/O In: 10 [I.V.:10] Out: -   PE: Back: wounds still with purulent drainage present.  Dressings changed.  Lab Results:  Recent Labs    03/22/18 0339 03/23/18 0439  WBC 25.3* 21.2*  HGB 11.2* 11.0*  HCT 35.3* 34.9*  PLT 386 311   BMET Recent Labs    03/22/18 0339 03/23/18 0439  NA 141 138  K 2.9* 3.1*  CL 107 106  CO2 21* 22  GLUCOSE 127* 281*  BUN 9 18  CREATININE 0.81 0.73  CALCIUM 9.4 9.0   PT/INR Recent Labs    03/21/18 1337  LABPROT 17.1*  INR 1.40   CMP     Component Value Date/Time   NA 138 03/23/2018 0439   NA 137 10/14/2017 1619   K 3.1 (L) 03/23/2018 0439   CL 106 03/23/2018 0439   CO2 22 03/23/2018 0439   GLUCOSE 281 (H) 03/23/2018 0439   BUN 18 03/23/2018 0439   BUN 9 10/14/2017 1619   CREATININE 0.73 03/23/2018 0439   CREATININE 0.77 12/11/2016 1045   CALCIUM 9.0 03/23/2018 0439   PROT 6.4 (L) 03/23/2018 0439   PROT 8.5 10/14/2017 1619   ALBUMIN 1.8 (L) 03/23/2018 0439   ALBUMIN 4.9 10/14/2017 1619   AST 9 (L) 03/23/2018 0439   ALT 7 03/23/2018 0439   ALT 9 09/22/2016 1106   ALKPHOS 126 03/23/2018 0439   BILITOT 0.5 03/23/2018 0439   BILITOT 0.4 10/14/2017 1619   GFRNONAA >60 03/23/2018 0439   GFRNONAA >89 12/11/2016 1045   GFRAA >60 03/23/2018 0439   GFRAA >89 12/11/2016  1045   Lipase     Component Value Date/Time   LIPASE 15 03/20/2018 0759       Studies/Results: Mr Cervical Spine W Wo Contrast  Result Date: 03/22/2018 CLINICAL DATA:  Spine infection. EXAM: MRI TOTAL SPINE WITHOUT AND WITH CONTRAST TECHNIQUE: Multisequence MR imaging of the spine from the cervical spine to the sacrum was performed prior to and following IV contrast administration. CONTRAST:  6 cc Gadavist intravenous COMPARISON:  CT of the thoracic and lumbar spine from 2 days ago FINDINGS: MRI CERVICAL SPINE FINDINGS Alignment: Normal Vertebrae: No evidence of osseous infection. Canal/Cord: There is extensive spinal fluid collection preferentially in the ventral but also in the right more than left dorsal canal. Subarachnoid space is diffusely effaced. Maximal thickness is posterior to C2 at 9 mm. No cord signal abnormality. Posterior Fossa, vertebral arteries, paraspinal tissues: No retropharyngeal or other discrete soft tissue collection. Disc levels: C2-3: Unremarkable. C3-4: Small left foraminal protrusion with moderate narrowing C4-5: Unremarkable. C5-6: Disc narrowing and bulging with asymmetric left uncovertebral spurring. Left foraminal impingement C6-7: Right foraminal protrusion mild narrowing. C7-T1:Unremarkable. MRI THORACIC SPINE FINDINGS Alignment:  Normal Vertebrae: No evidence of osteomyelitis or discitis. T12 butterfly vertebra. Canal/Cord: Cervical ventral epidural collection  continues throughout the thoracic levels. There is also a focal dorsal component at T3-4 to T5-6, where thecal sac effacement is accentuated and there is cord flattening. No cord edema. Paraspinal and other soft tissues: Paraspinous phlegmon on the left at T8-T12, with new complex left pleural effusion and lower lobe atelectasis Disc levels: T5-6 central disc protrusion. MRI LUMBAR SPINE FINDINGS Segmentation:  5 lumbar type vertebral bodies Alignment:  Grade 1 anterolisthesis at L5-S1. Vertebrae: Marrow edema  and heterogeneous enhancement within the L2, L3, and L4 spinous processes. No discitis or facet edema. Conus medullaris: Extends to the L1 level and is non edematous. There is extensive epidural collection completely effacing the thecal sac throughout the lumbar spine until L4-5 and below where the collection becomes ventral and right eccentric. The infection communicates with extensive bilateral abscess within the intrinsic back muscles via the interspinous space at L3-4. Patient had recent subcutaneous collection and left buttocks collection drainage, with packing seen in place. This midline, upper subcutaneous collection communicates with the paravertebral abscess along its superior margin based on postcontrast axial images. Paraspinal and other soft tissues: As above.  Distended bladder Disc levels: Chronic bilateral pars defects at L5. Critical Value/emergent results were called by telephone at the time of interpretation on 03/22/2018 at 3:04 pm to Dr. Thedore Mins , who verbally acknowledged these results. IMPRESSION: 1. Epidural abscess from C2 to sacrum as described. Maximal cord compression from T3-4 to T5-6. The thecal sac is completely effaced from L1 to L4-5. At the L3-4 interspinous space the spinal abscess communicates with large bilateral abscesses within the intrinsic back muscles. There is osteomyelitis of the L2, L3, and L4 spinous processes. No discitis or facet arthritis. 2. Left paravertebral abscess along the lower thoracic spine with small left empyema that is new from CT 2 days ago. 3. Distended bladder Electronically Signed   By: Marnee Spring M.D.   On: 03/22/2018 15:11   Mr Thoracic Spine W Wo Contrast  Result Date: 03/22/2018 CLINICAL DATA:  Spine infection. EXAM: MRI TOTAL SPINE WITHOUT AND WITH CONTRAST TECHNIQUE: Multisequence MR imaging of the spine from the cervical spine to the sacrum was performed prior to and following IV contrast administration. CONTRAST:  6 cc Gadavist intravenous  COMPARISON:  CT of the thoracic and lumbar spine from 2 days ago FINDINGS: MRI CERVICAL SPINE FINDINGS Alignment: Normal Vertebrae: No evidence of osseous infection. Canal/Cord: There is extensive spinal fluid collection preferentially in the ventral but also in the right more than left dorsal canal. Subarachnoid space is diffusely effaced. Maximal thickness is posterior to C2 at 9 mm. No cord signal abnormality. Posterior Fossa, vertebral arteries, paraspinal tissues: No retropharyngeal or other discrete soft tissue collection. Disc levels: C2-3: Unremarkable. C3-4: Small left foraminal protrusion with moderate narrowing C4-5: Unremarkable. C5-6: Disc narrowing and bulging with asymmetric left uncovertebral spurring. Left foraminal impingement C6-7: Right foraminal protrusion mild narrowing. C7-T1:Unremarkable. MRI THORACIC SPINE FINDINGS Alignment:  Normal Vertebrae: No evidence of osteomyelitis or discitis. T12 butterfly vertebra. Canal/Cord: Cervical ventral epidural collection continues throughout the thoracic levels. There is also a focal dorsal component at T3-4 to T5-6, where thecal sac effacement is accentuated and there is cord flattening. No cord edema. Paraspinal and other soft tissues: Paraspinous phlegmon on the left at T8-T12, with new complex left pleural effusion and lower lobe atelectasis Disc levels: T5-6 central disc protrusion. MRI LUMBAR SPINE FINDINGS Segmentation:  5 lumbar type vertebral bodies Alignment:  Grade 1 anterolisthesis at L5-S1. Vertebrae: Marrow edema and heterogeneous enhancement  within the L2, L3, and L4 spinous processes. No discitis or facet edema. Conus medullaris: Extends to the L1 level and is non edematous. There is extensive epidural collection completely effacing the thecal sac throughout the lumbar spine until L4-5 and below where the collection becomes ventral and right eccentric. The infection communicates with extensive bilateral abscess within the intrinsic back  muscles via the interspinous space at L3-4. Patient had recent subcutaneous collection and left buttocks collection drainage, with packing seen in place. This midline, upper subcutaneous collection communicates with the paravertebral abscess along its superior margin based on postcontrast axial images. Paraspinal and other soft tissues: As above.  Distended bladder Disc levels: Chronic bilateral pars defects at L5. Critical Value/emergent results were called by telephone at the time of interpretation on 03/22/2018 at 3:04 pm to Dr. Thedore Mins , who verbally acknowledged these results. IMPRESSION: 1. Epidural abscess from C2 to sacrum as described. Maximal cord compression from T3-4 to T5-6. The thecal sac is completely effaced from L1 to L4-5. At the L3-4 interspinous space the spinal abscess communicates with large bilateral abscesses within the intrinsic back muscles. There is osteomyelitis of the L2, L3, and L4 spinous processes. No discitis or facet arthritis. 2. Left paravertebral abscess along the lower thoracic spine with small left empyema that is new from CT 2 days ago. 3. Distended bladder Electronically Signed   By: Marnee Spring M.D.   On: 03/22/2018 15:11   Mr Lumbar Spine W Wo Contrast  Result Date: 03/22/2018 CLINICAL DATA:  Spine infection. EXAM: MRI TOTAL SPINE WITHOUT AND WITH CONTRAST TECHNIQUE: Multisequence MR imaging of the spine from the cervical spine to the sacrum was performed prior to and following IV contrast administration. CONTRAST:  6 cc Gadavist intravenous COMPARISON:  CT of the thoracic and lumbar spine from 2 days ago FINDINGS: MRI CERVICAL SPINE FINDINGS Alignment: Normal Vertebrae: No evidence of osseous infection. Canal/Cord: There is extensive spinal fluid collection preferentially in the ventral but also in the right more than left dorsal canal. Subarachnoid space is diffusely effaced. Maximal thickness is posterior to C2 at 9 mm. No cord signal abnormality. Posterior Fossa,  vertebral arteries, paraspinal tissues: No retropharyngeal or other discrete soft tissue collection. Disc levels: C2-3: Unremarkable. C3-4: Small left foraminal protrusion with moderate narrowing C4-5: Unremarkable. C5-6: Disc narrowing and bulging with asymmetric left uncovertebral spurring. Left foraminal impingement C6-7: Right foraminal protrusion mild narrowing. C7-T1:Unremarkable. MRI THORACIC SPINE FINDINGS Alignment:  Normal Vertebrae: No evidence of osteomyelitis or discitis. T12 butterfly vertebra. Canal/Cord: Cervical ventral epidural collection continues throughout the thoracic levels. There is also a focal dorsal component at T3-4 to T5-6, where thecal sac effacement is accentuated and there is cord flattening. No cord edema. Paraspinal and other soft tissues: Paraspinous phlegmon on the left at T8-T12, with new complex left pleural effusion and lower lobe atelectasis Disc levels: T5-6 central disc protrusion. MRI LUMBAR SPINE FINDINGS Segmentation:  5 lumbar type vertebral bodies Alignment:  Grade 1 anterolisthesis at L5-S1. Vertebrae: Marrow edema and heterogeneous enhancement within the L2, L3, and L4 spinous processes. No discitis or facet edema. Conus medullaris: Extends to the L1 level and is non edematous. There is extensive epidural collection completely effacing the thecal sac throughout the lumbar spine until L4-5 and below where the collection becomes ventral and right eccentric. The infection communicates with extensive bilateral abscess within the intrinsic back muscles via the interspinous space at L3-4. Patient had recent subcutaneous collection and left buttocks collection drainage, with packing seen in  place. This midline, upper subcutaneous collection communicates with the paravertebral abscess along its superior margin based on postcontrast axial images. Paraspinal and other soft tissues: As above.  Distended bladder Disc levels: Chronic bilateral pars defects at L5. Critical  Value/emergent results were called by telephone at the time of interpretation on 03/22/2018 at 3:04 pm to Dr. Thedore MinsSingh , who verbally acknowledged these results. IMPRESSION: 1. Epidural abscess from C2 to sacrum as described. Maximal cord compression from T3-4 to T5-6. The thecal sac is completely effaced from L1 to L4-5. At the L3-4 interspinous space the spinal abscess communicates with large bilateral abscesses within the intrinsic back muscles. There is osteomyelitis of the L2, L3, and L4 spinous processes. No discitis or facet arthritis. 2. Left paravertebral abscess along the lower thoracic spine with small left empyema that is new from CT 2 days ago. 3. Distended bladder Electronically Signed   By: Marnee SpringJonathon  Watts M.D.   On: 03/22/2018 15:11    Anti-infectives: Anti-infectives (From admission, onward)   Start     Dose/Rate Route Frequency Ordered Stop   03/21/18 0930  ceFAZolin (ANCEF) IVPB 2g/100 mL premix     2 g 200 mL/hr over 30 Minutes Intravenous Every 8 hours 03/21/18 0920         Assessment/Plan T2DM on insulin - A1c 02/26/18 12.6, blood glucose in the 200s  HTN Hx of IVDA - positive for cocaine and opiates on UDS yesterday, hepatitis and HIV pending  MSSA bacteremia - abx per primary team Lumbar abscesses, POD 1, s/p I&D - CX: gram + cocci  - TID dressing changes secondary to persistent purulent drainage from wounds -PT hydrotherapy to see if they can get into these wounds to help clean them up as well.  Epidural abscess from C2-sacrum -per medicine and NS Endocarditis -per medicine  FEN: CM diet VTE: SCDs/heparin ID: ancef 9/21>>   LOS: 3 days    Letha CapeKelly E Sharad Vaneaton , [facility] 03/23/2018, 10:19 AM Pager: [phone]

## 2018-03-23 NOTE — Progress Notes (Signed)
@IPLOG @        PROGRESS NOTE                                                                                                                                                                                                             Patient Demographics:    Tony Long, is a 42 y.o. male, DOB - Mar 23, 1976, ZOX:[id number]  Admit date - 03/20/2018   Admitting Physician Kendell Bane, MD  Outpatient Primary MD for the patient is [name], FNP  LOS - 3  CC Back pain  Brief Narrative  Tony Long is a 42 y.o. male   H/o insulin dependent DM2, HTN, h/o IVDU, reports has low back pain for the last 4 weeks, he was seen in the ED a week ago was sent home with pain meds and muscle relaxer, pain got worse, he started to use his son's subutex for the last week, pain continue to get worse and he starts to feel week in both his legs, he denies fever. no bowel and bladder incontinence, no saddle anesthesia,  He also reports neck pain. he returned to the [facility] ED today.   Subjective:    Tony Long today in bed eyes closed, comfortable, will not talk today.   Assessment  & Plan :     1.  MSSA bacteremia with tricuspid valve vegetation and endocarditis, large almost entire spine covering paraspinal abscess located ventrally C2 through the sacrum along with soft tissue back paraspinal abscess with a nonspecific fluid soft tissue collection on the right side of the neck.    So far general surgery, IR, ID and neurosurgery have been consulted on this case.  Case has been discussed by me with general surgery, ID Dr. Ninetta Lights and neurosurgery Dr. Venetia Maxon on 03/23/2018.  Lower back paraspinal abscess was incision and drained by general surgery on 03/22/2018, his small endocarditis and large epidural abscess are not amenable to surgical intervention.  Plan is prolonged IV antibiotics through PICC line.  He remains at risk for death, paraplegia, quadriplegia.  This was explained to him  and his family in detail by me bedside on 03/23/2018.  2.  DKA in a patient with DM type II.  Continue DKA protocol, twice daily Lantus started, will monitor electrolytes and CBG control.  Lab Results  Component Value Date   HGBA1C 12.6 (A) 02/26/2018   CBG (last 3)  Recent Labs    03/22/18 1619 03/22/18 2039 03/23/18 0739  GLUCAP 157* 184* 271*     3.  MSSA bacteremia.  See #1 above.  4.  HX of smoking, intermittent alcohol use, cocaine, Suboxone abuse - currently in narcotic withdrawal, told to quit all, on Suboxone withdrawal protocol, will add Klonopin and Catapres to reduce severe withdrawal, PRN oral narcotic for breakthrough during dressing changes only.  Will monitor.  5.  Essential hypertension.  On clonidine which will be continued, add Lopressor, PRN Lopressor added as well.   Family Communication  :  Parents bedside 03/21/18, 03/23/18  Code Status :  Full  Disposition Plan  :  Tele  Consults  :  ID, N-Surg, CCS, IR  Procedures  :    TEE - - Left ventricle: The cavity size was normal. Wall thickness was normal. Systolic function was normal. The estimated ejection fraction was in the range of 60% to 65%. - Aortic valve: No evidence of vegetation. - Mitral valve: No evidence of vegetation. - Left atrium: No evidence of thrombus in the atrial cavity or   appendage. No evidence of thrombus in the appendage. - Right atrium: No evidence of thrombus in the atrial cavity or  appendage. - Tricuspid valve: There was a vegetation. There was a small (1.2cm x .6cm), mobile vegetation on the septal leaflet. There was mild regurgitation. - Pulmonic valve: No evidence of vegetation.  Impressions:   Tricuspid valve endocarditis.  Discussed with primary team    MRI C-T-L Spine - 1. Epidural abscess from C2 to sacrum as described. Maximal cord compression from T3-4 to T5-6. The thecal sac is completely effaced from L1 to L4-5. At the L3-4 interspinous space the spinal abscess  communicates with large bilateral abscesses within the intrinsic back muscles. There is osteomyelitis of the L2, L3, and L4 spinous processes. No discitis or facet arthritis. 2. Left paravertebral abscess along the lower thoracic spine with small left empyema that is new from CT 2 days ago. 3. Distended bladder  CT - 1. Evidence of cellulitis involving the right side of the base of the neck extending into the right supraclavicular region with fluid and gas in the soft tissues at the base of the right side of the neck. 2. No significant abnormality of the thoracic spine. 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  CT -  1. Extensive abnormal fluid collection in the subcutaneous fat of the midline of the back with underlying marked abnormality of the posterior paraspinal musculature from L1-2 through S3. This is worrisome for subcutaneous abscess and myositis. 2. Moth-eaten appearance of the spinous processes of L3 and L4 consistent with osteomyelitis. 3. No discrete epidural abscess. However, the abnormal edema in the paraspinal musculature extends to the posterior aspect of the spinal canal at L3-4. 4. MRI with and without contrast may better define the extent of the soft tissue and infection and could detect epidural extension that is not apparent on this unenhanced CT scan.    DVT Prophylaxis  :   Heparin    Lab Results  Component Value Date   PLT 311 03/23/2018    Diet :  Diet Order            Diet Carb Modified Fluid consistency: Thin; Room service appropriate? Yes  Diet effective now               Inpatient Medications Scheduled Meds: . buprenorphine-naloxone  2 tablet Sublingual BID  . cloNIDine  0.2 mg Oral TID  . cyclobenzaprine  5 mg Oral TID  . gabapentin  300 mg Oral TID  . heparin injection (subcutaneous)  5,000 Units Subcutaneous Q8H  . insulin aspart  0-24 Units Subcutaneous TID WC  . insulin glargine  20 Units Subcutaneous BID  . LORazepam  1 mg Oral Once  . nicotine  21  mg Transdermal Daily  . polyethylene glycol  17 g Oral BID  . potassium chloride  40 mEq Oral Once  . sodium chloride flush  10-40 mL Intracatheter Q12H   Continuous Infusions: .  ceFAZolin (ANCEF) IV 2 g (03/23/18 0502)  . magnesium sulfate 1 - 4 g bolus IVPB     PRN Meds:.hydrALAZINE, LORazepam, oxyCODONE-acetaminophen  Antibiotics  :   Anti-infectives (From admission, onward)   Start     Dose/Rate Route Frequency Ordered Stop   03/21/18 0930  ceFAZolin (ANCEF) IVPB 2g/100 mL premix     2 g 200 mL/hr over 30 Minutes Intravenous Every 8 hours 03/21/18 0920            Objective:   Vitals:   03/23/18 0102 03/23/18 0239 03/23/18 0454 03/23/18 0855  BP: 117/79  (!) 155/102 (!) 147/93  Pulse: (!) 110  (!) 110   Resp: 13  15   Temp:  (!) 97.5 F (36.4 C) 98.7 F (37.1 C)   TempSrc:  Oral Oral   SpO2: 96%  95%   Weight:      Height:        Wt Readings from Last 3 Encounters:  03/21/18 68 kg  03/20/18 68 kg  03/07/18 70 kg     Intake/Output Summary (Last 24 hours) at 03/23/2018 1118 Last data filed at 03/23/2018 0902 Gross per 24 hour  Intake 2916.49 ml  Output 2380 ml  Net 536.49 ml     Physical Exam  Awake Alert, Oriented X 3, No new F.N deficits, Normal affect .AT,PERRAL Supple Neck,No JVD, No cervical lymphadenopathy appriciated.  Symmetrical Chest wall movement, Good air movement bilaterally, CTAB RRR,No Gallops, Rubs or new Murmurs, No Parasternal Heave +ve B.Sounds, Abd Soft, No tenderness, No organomegaly appriciated, No rebound - guarding or rigidity. No Cyanosis, Clubbing or edema, No new Rash or bruise, left lower back paraspinal incision and drainage area stable.     Data Review:    CBC Recent Labs  Lab 03/20/18 0759 03/21/18 1337 03/22/18 0339 03/23/18 0439  WBC 22.2* 26.3* 25.3* 21.2*  HGB 10.5* 10.3* 11.2* 11.0*  HCT 32.1* 34.8* 35.3* 34.9*  PLT 388 374 386 311  MCV 95.6 103.6* 97.0 96.4  MCH 31.3 30.7 30.8 30.4  MCHC 32.7  29.6* 31.7 31.5  RDW 14.3 13.1 12.6 13.3  LYMPHSABS 0.8*  --   --   --   MONOABS 1.4*  --   --   --   EOSABS 0.0  --   --   --   BASOSABS 0.2*  --   --   --     Chemistries  Recent Labs  Lab 03/20/18 0759 03/20/18 1235 03/21/18 1338 03/21/18 2228 03/22/18 0339 03/23/18 0439  NA 138 140 136 136 141 138  K 3.7 3.8 3.4* 2.8* 2.9* 3.1*  CL 92* 97* 102 106 107 106  CO2 18* 17* 9* 18* 21* 22  GLUCOSE 368* 367* 348* 143* 127* 281*  BUN 11 14 14 11 9 18   CREATININE 0.97 0.97 1.53* 0.93 0.81 0.73  CALCIUM 9.2 9.2 9.3 9.3 9.4 9.0  MG 1.8  --  1.6*  --  1.7 1.8  AST 13*  --   --   --  9* 9*  ALT 8  --   --   --  8 7  ALKPHOS 165*  --   --   --  154* 126  BILITOT 2.3*  --   --   --  0.8 0.5   ------------------------------------------------------------------------------------------------------------------ No results for input(s): CHOL, HDL, LDLCALC, TRIG, CHOLHDL, LDLDIRECT in the last 72 hours.  Lab Results  Component Value Date   HGBA1C 12.6 (A) 02/26/2018   ------------------------------------------------------------------------------------------------------------------ No results for input(s): TSH, T4TOTAL, T3FREE, THYROIDAB in the last 72 hours.  Invalid input(s): FREET3 ------------------------------------------------------------------------------------------------------------------ No results for input(s): VITAMINB12, FOLATE, FERRITIN, TIBC, IRON, RETICCTPCT in the last 72 hours.  Coagulation profile Recent Labs  Lab 03/20/18 0759 03/21/18 1337  INR 1.18 1.40    No results for input(s): DDIMER in the last 72 hours.  Cardiac Enzymes No results for input(s): CKMB, TROPONINI, MYOGLOBIN in the last 168 hours.  Invalid input(s): CK ------------------------------------------------------------------------------------------------------------------ No results found for: BNP  Micro Results Recent Results (from the past 240 hour(s))  Blood Culture (routine x 2)      Status: Abnormal   Collection Time: 03/20/18  7:59 AM  Result Value Ref Range Status   Specimen Description   Final    BLOOD EJ Performed at [facility], [address], [place], [place] [address]    Special Requests   Final    BOTTLES DRAWN AEROBIC AND ANAEROBIC Blood Culture adequate volume Performed at [name], [address], [place], [place] [address]    Culture  Setup Time   Final    GRAM POSITIVE COCCI IN BOTH AEROBIC AND ANAEROBIC BOTTLES CRITICAL RESULT CALLED TO, READ BACK BY AND VERIFIED WITH: MATT MCBANE ON 03/21/18 AT 0001 QSD Performed at [facility], [address] [place], [place] [address]    Culture (A)  Final    STAPHYLOCOCCUS AUREUS SUSCEPTIBILITIES PERFORMED ON PREVIOUS CULTURE WITHIN THE LAST 5 DAYS. Performed at [name], [address] [place], [place] [address]    Report Status 03/23/2018 FINAL  Final  Blood Culture (routine x 2)     Status: Abnormal   Collection Time: 03/20/18  7:59 AM  Result Value Ref Range Status   Specimen Description   Final    BLOOD EJ Performed at [facility], [address] [place], [place] [address]    Special Requests   Final    BOTTLES DRAWN AEROBIC AND ANAEROBIC Blood Culture adequate volume Performed at [facility], [address] [place], [place] [address]    Culture  Setup Time   Final    GRAM POSITIVE COCCI IN BOTH AEROBIC AND ANAEROBIC BOTTLES CRITICAL RESULT CALLED TO, READ BACK BY AND VERIFIED WITH: MATT MCBANE ON 03/21/18 AT 0001 QSD Performed at [facility], [address] [place], [place] [address]    Culture STAPHYLOCOCCUS AUREUS (A)  Final   Report Status 03/23/2018 FINAL  Final   Organism ID, Bacteria STAPHYLOCOCCUS AUREUS  Final      Susceptibility   Staphylococcus aureus - MIC*    CIPROFLOXACIN <=0.5 SENSITIVE Sensitive     ERYTHROMYCIN <=0.25 SENSITIVE Sensitive     GENTAMICIN <=0.5 SENSITIVE Sensitive     OXACILLIN  <=0.25 SENSITIVE Sensitive     TETRACYCLINE <=1 SENSITIVE Sensitive     VANCOMYCIN <=0.5 SENSITIVE Sensitive     TRIMETH/SULFA <=10 SENSITIVE Sensitive     CLINDAMYCIN <=0.25 SENSITIVE Sensitive     RIFAMPIN <=0.5 SENSITIVE Sensitive     Inducible Clindamycin NEGATIVE Sensitive     * STAPHYLOCOCCUS AUREUS  Blood Culture  ID Panel (Reflexed)     Status: Abnormal   Collection Time: 03/20/18  7:59 AM  Result Value Ref Range Status   Enterococcus species NOT DETECTED NOT DETECTED Final   Listeria monocytogenes NOT DETECTED NOT DETECTED Final   Staphylococcus species DETECTED (A) NOT DETECTED Final    Comment: CRITICAL RESULT CALLED TO, READ BACK BY AND VERIFIED WITH: MATT MCBANE ON 03/21/18 AT 0001 QSD    Staphylococcus aureus DETECTED (A) NOT DETECTED Final    Comment: Methicillin (oxacillin) susceptible Staphylococcus aureus (MSSA). Preferred therapy is anti staphylococcal beta lactam antibiotic (Cefazolin or Nafcillin), unless clinically contraindicated. CRITICAL RESULT CALLED TO, READ BACK BY AND VERIFIED WITH: MATT MCBANE ON 03/21/18 AT 0001 QSD    Methicillin resistance NOT DETECTED NOT DETECTED Final   Streptococcus species NOT DETECTED NOT DETECTED Final   Streptococcus agalactiae NOT DETECTED NOT DETECTED Final   Streptococcus pneumoniae NOT DETECTED NOT DETECTED Final   Streptococcus pyogenes NOT DETECTED NOT DETECTED Final   Acinetobacter baumannii NOT DETECTED NOT DETECTED Final   Enterobacteriaceae species NOT DETECTED NOT DETECTED Final   Enterobacter cloacae complex NOT DETECTED NOT DETECTED Final   Escherichia coli NOT DETECTED NOT DETECTED Final   Klebsiella oxytoca NOT DETECTED NOT DETECTED Final   Klebsiella pneumoniae NOT DETECTED NOT DETECTED Final   Proteus species NOT DETECTED NOT DETECTED Final   Serratia marcescens NOT DETECTED NOT DETECTED Final   Haemophilus influenzae NOT DETECTED NOT DETECTED Final   Neisseria meningitidis NOT DETECTED NOT DETECTED Final    Pseudomonas aeruginosa NOT DETECTED NOT DETECTED Final   Candida albicans NOT DETECTED NOT DETECTED Final   Candida glabrata NOT DETECTED NOT DETECTED Final   Candida krusei NOT DETECTED NOT DETECTED Final   Candida parapsilosis NOT DETECTED NOT DETECTED Final   Candida tropicalis NOT DETECTED NOT DETECTED Final    Comment: Performed at [facility], [address] [place], [place] [address]  MRSA PCR Screening     Status: None   Collection Time: 03/20/18  3:50 PM  Result Value Ref Range Status   MRSA by PCR NEGATIVE NEGATIVE Final    Comment:        The GeneXpert MRSA Assay (FDA approved for NASAL specimens only), is one component of a comprehensive MRSA colonization surveillance program. It is not intended to diagnose MRSA infection nor to guide or monitor treatment for MRSA infections. Performed at [facility], [address] [place], [place] [address]   Aerobic Culture (superficial specimen)     Status: None (Preliminary result)   Collection Time: 03/22/18 10:36 AM  Result Value Ref Range Status   Specimen Description ABSCESS BACK  Final   Special Requests NONE  Final   Gram Stain   Final    FEW WBC PRESENT,BOTH PMN AND MONONUCLEAR MODERATE GRAM POSITIVE COCCI Performed at [facility], [address] [place], [place] [address]    Culture ABUNDANT STAPHYLOCOCCUS AUREUS  Final   Report Status PENDING  Incomplete    Radiology Reports Ct Thoracic Spine Wo Contrast  Result Date: 03/20/2018 CLINICAL DATA:  Progressive back pain.  Swelling of the lower back. EXAM: CT THORACIC SPINE WITHOUT CONTRAST TECHNIQUE: Multidetector CT images of the thoracic were obtained using the standard protocol without intravenous contrast. COMPARISON:  None. FINDINGS: Alignment: Normal. 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 Paraspinal and other soft tissues: There is abnormal gas and fluid in the soft tissues of the right side of the base of the neck and  in the  right supraclavicular region. Paraspinal soft tissues appear normal throughout the thoracic spine. Disc levels: There is no evidence of disc protrusion or significant disc bulging or spinal or foraminal stenosis or other significant abnormality of the thoracic spine. IMPRESSION: 1. Evidence of cellulitis involving the right side of the base of the neck extending into the right supraclavicular region with fluid and gas in the soft tissues at the base of the right side of the neck. 2. No significant abnormality of the thoracic spine. 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 Electronically Signed   By: Francene Boyers M.D.   On: 03/20/2018 11:25   Ct Lumbar Spine Wo Contrast  Addendum Date: 03/20/2018   ADDENDUM REPORT: 03/20/2018 11:44 ADDENDUM: Critical Value/emergent results were called by telephone at the time of interpretation on 03/20/2018 at 11:30 am to Dr. Sharyn Creamer , who verbally acknowledged these results. Electronically Signed   By: Francene Boyers M.D.   On: 03/20/2018 11:44   Result Date: 03/20/2018 CLINICAL DATA:  Increasing low back pain and soft tissue swelling. EXAM: CT LUMBAR SPINE WITHOUT CONTRAST TECHNIQUE: Multidetector CT imaging of the lumbar spine was performed without intravenous contrast administration. Multiplanar CT image reconstructions were also generated. IV contrast could not be utilized due to the lack of an appropriate IV. COMPARISON:  None. FINDINGS: Segmentation: 5 lumbar type vertebrae. Alignment: Normal. Vertebrae: There is a moth-eaten appearance of the spinous processes of L3 and L4 which is worrisome for osteomyelitis. Bilateral pars defects at L5 with grade 1 spondylolisthesis. 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 Paraspinal and other soft tissues: There is an extensive abnormal fluid collection in the subcutaneous soft tissues of the posterior aspect of the back extending from approximately L1-2 to S3. This fluid collection is lobulated and measures approximately 20 x 9 x  2.5 cm. It is centered slightly to the left of midline and has a mass effect upon the adjacent posterior paraspinal muscles. There is abnormal lucency in the underlying paraspinal muscles which could represent myositis. Disc levels: T11-12: No significant abnormality. Butterfly T12 vertebra. T12-L1: No significant abnormality. L1-2: Normal disc. Abnormal edema in the posterior paraspinal musculature with adjacent fluid collection in the subcutaneous fat of the posterior aspect of the back as described above. L2-3: Normal disc. L3-4: Normal disc. Lucency in the posterior paraspinal soft tissues extends to the posterior aspect of the thecal sac on image 80 of series 4 but there is no discrete epidural abscess. L4-5: Normal disc.  No evidence of epidural abscess. L5-S1: Grade 1 spondylolisthesis. No disc bulging or protrusion. Bilateral pars defects. No visible epidural abscess. IMPRESSION: 1. Extensive abnormal fluid collection in the subcutaneous fat of the midline of the back with underlying marked abnormality of the posterior paraspinal musculature from L1-2 through S3. This is worrisome for subcutaneous abscess and myositis. 2. Moth-eaten appearance of the spinous processes of L3 and L4 consistent with osteomyelitis. 3. No discrete epidural abscess. However, the abnormal edema in the paraspinal musculature extends to the posterior aspect of the spinal canal at L3-4. 4. MRI with and without contrast may better define the extent of the soft tissue and infection and could detect epidural extension that is not apparent on this unenhanced CT scan. Electronically Signed: By: Francene Boyers M.D. On: 03/20/2018 11:18   Mr Cervical Spine W Wo Contrast  Result Date: 03/22/2018 CLINICAL DATA:  Spine infection. EXAM: MRI TOTAL SPINE WITHOUT AND WITH CONTRAST TECHNIQUE: Multisequence MR imaging of the spine from the cervical spine to the  sacrum was performed prior to and following IV contrast administration. CONTRAST:  6  cc Gadavist intravenous COMPARISON:  CT of the thoracic and lumbar spine from 2 days ago FINDINGS: MRI CERVICAL SPINE FINDINGS Alignment: Normal Vertebrae: No evidence of osseous infection. Canal/Cord: There is extensive spinal fluid collection preferentially in the ventral but also in the right more than left dorsal canal. Subarachnoid space is diffusely effaced. Maximal thickness is posterior to C2 at 9 mm. No cord signal abnormality. Posterior Fossa, vertebral arteries, paraspinal tissues: No retropharyngeal or other discrete soft tissue collection. Disc levels: C2-3: Unremarkable. C3-4: Small left foraminal protrusion with moderate narrowing C4-5: Unremarkable. C5-6: Disc narrowing and bulging with asymmetric left uncovertebral spurring. Left foraminal impingement C6-7: Right foraminal protrusion mild narrowing. C7-T1:Unremarkable. MRI THORACIC SPINE FINDINGS Alignment:  Normal Vertebrae: No evidence of osteomyelitis or discitis. T12 butterfly vertebra. Canal/Cord: Cervical ventral epidural collection continues throughout the thoracic levels. There is also a focal dorsal component at T3-4 to T5-6, where thecal sac effacement is accentuated and there is cord flattening. No cord edema. Paraspinal and other soft tissues: Paraspinous phlegmon on the left at T8-T12, with new complex left pleural effusion and lower lobe atelectasis Disc levels: T5-6 central disc protrusion. MRI LUMBAR SPINE FINDINGS Segmentation:  5 lumbar type vertebral bodies Alignment:  Grade 1 anterolisthesis at L5-S1. Vertebrae: Marrow edema and heterogeneous enhancement within the L2, L3, and L4 spinous processes. No discitis or facet edema. Conus medullaris: Extends to the L1 level and is non edematous. There is extensive epidural collection completely effacing the thecal sac throughout the lumbar spine until L4-5 and below where the collection becomes ventral and right eccentric. The infection communicates with extensive bilateral abscess  within the intrinsic back muscles via the interspinous space at L3-4. Patient had recent subcutaneous collection and left buttocks collection drainage, with packing seen in place. This midline, upper subcutaneous collection communicates with the paravertebral abscess along its superior margin based on postcontrast axial images. Paraspinal and other soft tissues: As above.  Distended bladder Disc levels: Chronic bilateral pars defects at L5. Critical Value/emergent results were called by telephone at the time of interpretation on 03/22/2018 at 3:04 pm to Dr. Thedore Mins , who verbally acknowledged these results. IMPRESSION: 1. Epidural abscess from C2 to sacrum as described. Maximal cord compression from T3-4 to T5-6. The thecal sac is completely effaced from L1 to L4-5. At the L3-4 interspinous space the spinal abscess communicates with large bilateral abscesses within the intrinsic back muscles. There is osteomyelitis of the L2, L3, and L4 spinous processes. No discitis or facet arthritis. 2. Left paravertebral abscess along the lower thoracic spine with small left empyema that is new from CT 2 days ago. 3. Distended bladder Electronically Signed   By: Marnee Spring M.D.   On: 03/22/2018 15:11   Mr Thoracic Spine W Wo Contrast  Result Date: 03/22/2018 CLINICAL DATA:  Spine infection. EXAM: MRI TOTAL SPINE WITHOUT AND WITH CONTRAST TECHNIQUE: Multisequence MR imaging of the spine from the cervical spine to the sacrum was performed prior to and following IV contrast administration. CONTRAST:  6 cc Gadavist intravenous COMPARISON:  CT of the thoracic and lumbar spine from 2 days ago FINDINGS: MRI CERVICAL SPINE FINDINGS Alignment: Normal Vertebrae: No evidence of osseous infection. Canal/Cord: There is extensive spinal fluid collection preferentially in the ventral but also in the right more than left dorsal canal. Subarachnoid space is diffusely effaced. Maximal thickness is posterior to C2 at 9 mm. No cord signal  abnormality.  Posterior Fossa, vertebral arteries, paraspinal tissues: No retropharyngeal or other discrete soft tissue collection. Disc levels: C2-3: Unremarkable. C3-4: Small left foraminal protrusion with moderate narrowing C4-5: Unremarkable. C5-6: Disc narrowing and bulging with asymmetric left uncovertebral spurring. Left foraminal impingement C6-7: Right foraminal protrusion mild narrowing. C7-T1:Unremarkable. MRI THORACIC SPINE FINDINGS Alignment:  Normal Vertebrae: No evidence of osteomyelitis or discitis. T12 butterfly vertebra. Canal/Cord: Cervical ventral epidural collection continues throughout the thoracic levels. There is also a focal dorsal component at T3-4 to T5-6, where thecal sac effacement is accentuated and there is cord flattening. No cord edema. Paraspinal and other soft tissues: Paraspinous phlegmon on the left at T8-T12, with new complex left pleural effusion and lower lobe atelectasis Disc levels: T5-6 central disc protrusion. MRI LUMBAR SPINE FINDINGS Segmentation:  5 lumbar type vertebral bodies Alignment:  Grade 1 anterolisthesis at L5-S1. Vertebrae: Marrow edema and heterogeneous enhancement within the L2, L3, and L4 spinous processes. No discitis or facet edema. Conus medullaris: Extends to the L1 level and is non edematous. There is extensive epidural collection completely effacing the thecal sac throughout the lumbar spine until L4-5 and below where the collection becomes ventral and right eccentric. The infection communicates with extensive bilateral abscess within the intrinsic back muscles via the interspinous space at L3-4. Patient had recent subcutaneous collection and left buttocks collection drainage, with packing seen in place. This midline, upper subcutaneous collection communicates with the paravertebral abscess along its superior margin based on postcontrast axial images. Paraspinal and other soft tissues: As above.  Distended bladder Disc levels: Chronic bilateral pars  defects at L5. Critical Value/emergent results were called by telephone at the time of interpretation on 03/22/2018 at 3:04 pm to Dr. Thedore Mins , who verbally acknowledged these results. IMPRESSION: 1. Epidural abscess from C2 to sacrum as described. Maximal cord compression from T3-4 to T5-6. The thecal sac is completely effaced from L1 to L4-5. At the L3-4 interspinous space the spinal abscess communicates with large bilateral abscesses within the intrinsic back muscles. There is osteomyelitis of the L2, L3, and L4 spinous processes. No discitis or facet arthritis. 2. Left paravertebral abscess along the lower thoracic spine with small left empyema that is new from CT 2 days ago. 3. Distended bladder Electronically Signed   By: Marnee Spring M.D.   On: 03/22/2018 15:11   Mr Lumbar Spine W Wo Contrast  Result Date: 03/22/2018 CLINICAL DATA:  Spine infection. EXAM: MRI TOTAL SPINE WITHOUT AND WITH CONTRAST TECHNIQUE: Multisequence MR imaging of the spine from the cervical spine to the sacrum was performed prior to and following IV contrast administration. CONTRAST:  6 cc Gadavist intravenous COMPARISON:  CT of the thoracic and lumbar spine from 2 days ago FINDINGS: MRI CERVICAL SPINE FINDINGS Alignment: Normal Vertebrae: No evidence of osseous infection. Canal/Cord: There is extensive spinal fluid collection preferentially in the ventral but also in the right more than left dorsal canal. Subarachnoid space is diffusely effaced. Maximal thickness is posterior to C2 at 9 mm. No cord signal abnormality. Posterior Fossa, vertebral arteries, paraspinal tissues: No retropharyngeal or other discrete soft tissue collection. Disc levels: C2-3: Unremarkable. C3-4: Small left foraminal protrusion with moderate narrowing C4-5: Unremarkable. C5-6: Disc narrowing and bulging with asymmetric left uncovertebral spurring. Left foraminal impingement C6-7: Right foraminal protrusion mild narrowing. C7-T1:Unremarkable. MRI THORACIC  SPINE FINDINGS Alignment:  Normal Vertebrae: No evidence of osteomyelitis or discitis. T12 butterfly vertebra. Canal/Cord: Cervical ventral epidural collection continues throughout the thoracic levels. There is also a focal dorsal component  at T3-4 to T5-6, where thecal sac effacement is accentuated and there is cord flattening. No cord edema. Paraspinal and other soft tissues: Paraspinous phlegmon on the left at T8-T12, with new complex left pleural effusion and lower lobe atelectasis Disc levels: T5-6 central disc protrusion. MRI LUMBAR SPINE FINDINGS Segmentation:  5 lumbar type vertebral bodies Alignment:  Grade 1 anterolisthesis at L5-S1. Vertebrae: Marrow edema and heterogeneous enhancement within the L2, L3, and L4 spinous processes. No discitis or facet edema. Conus medullaris: Extends to the L1 level and is non edematous. There is extensive epidural collection completely effacing the thecal sac throughout the lumbar spine until L4-5 and below where the collection becomes ventral and right eccentric. The infection communicates with extensive bilateral abscess within the intrinsic back muscles via the interspinous space at L3-4. Patient had recent subcutaneous collection and left buttocks collection drainage, with packing seen in place. This midline, upper subcutaneous collection communicates with the paravertebral abscess along its superior margin based on postcontrast axial images. Paraspinal and other soft tissues: As above.  Distended bladder Disc levels: Chronic bilateral pars defects at L5. Critical Value/emergent results were called by telephone at the time of interpretation on 03/22/2018 at 3:04 pm to Dr. Thedore Mins , who verbally acknowledged these results. IMPRESSION: 1. Epidural abscess from C2 to sacrum as described. Maximal cord compression from T3-4 to T5-6. The thecal sac is completely effaced from L1 to L4-5. At the L3-4 interspinous space the spinal abscess communicates with large bilateral  abscesses within the intrinsic back muscles. There is osteomyelitis of the L2, L3, and L4 spinous processes. No discitis or facet arthritis. 2. Left paravertebral abscess along the lower thoracic spine with small left empyema that is new from CT 2 days ago. 3. Distended bladder Electronically Signed   By: Marnee Spring M.D.   On: 03/22/2018 15:11   [place] Ekg Site Rite  Result Date: 03/21/2018 If Site Rite image not attached, placement could not be confirmed due to current cardiac rhythm.   Time Spent in minutes  30   Susa Raring M.D on 03/23/2018 at 11:18 AM  To page go to [email or web address] - password [email or web address]

## 2018-03-24 DIAGNOSIS — L02212 Cutaneous abscess of back [any part, except buttock]: Secondary | ICD-10-CM

## 2018-03-24 DIAGNOSIS — I079 Rheumatic tricuspid valve disease, unspecified: Secondary | ICD-10-CM

## 2018-03-24 LAB — AEROBIC CULTURE W GRAM STAIN (SUPERFICIAL SPECIMEN)

## 2018-03-24 LAB — CBC
HCT: 35.7 % — ABNORMAL LOW (ref 39.0–52.0)
HEMOGLOBIN: 11.4 g/dL — AB (ref 13.0–17.0)
MCH: 31.1 pg (ref 26.0–34.0)
MCHC: 31.9 g/dL (ref 30.0–36.0)
MCV: 97.3 fL (ref 78.0–100.0)
PLATELETS: 293 10*3/uL (ref 150–400)
RBC: 3.67 MIL/uL — AB (ref 4.22–5.81)
RDW: 13.2 % (ref 11.5–15.5)
WBC: 16.7 10*3/uL — ABNORMAL HIGH (ref 4.0–10.5)

## 2018-03-24 LAB — AEROBIC CULTURE  (SUPERFICIAL SPECIMEN)

## 2018-03-24 LAB — COMPREHENSIVE METABOLIC PANEL
ALT: 9 U/L (ref 0–44)
AST: 17 U/L (ref 15–41)
Albumin: 1.9 g/dL — ABNORMAL LOW (ref 3.5–5.0)
Alkaline Phosphatase: 121 U/L (ref 38–126)
Anion gap: 12 (ref 5–15)
BUN: 17 mg/dL (ref 6–20)
CHLORIDE: 104 mmol/L (ref 98–111)
CO2: 24 mmol/L (ref 22–32)
CREATININE: 0.64 mg/dL (ref 0.61–1.24)
Calcium: 9.3 mg/dL (ref 8.9–10.3)
Glucose, Bld: 174 mg/dL — ABNORMAL HIGH (ref 70–99)
Potassium: 2.9 mmol/L — ABNORMAL LOW (ref 3.5–5.1)
SODIUM: 140 mmol/L (ref 135–145)
Total Bilirubin: 0.5 mg/dL (ref 0.3–1.2)
Total Protein: 6.6 g/dL (ref 6.5–8.1)

## 2018-03-24 LAB — GLUCOSE, CAPILLARY
GLUCOSE-CAPILLARY: 146 mg/dL — AB (ref 70–99)
GLUCOSE-CAPILLARY: 42 mg/dL — AB (ref 70–99)
GLUCOSE-CAPILLARY: 100 mg/dL — AB (ref 70–99)
Glucose-Capillary: 40 mg/dL — CL (ref 70–99)
Glucose-Capillary: 135 mg/dL — ABNORMAL HIGH (ref 70–99)
Glucose-Capillary: 94 mg/dL (ref 70–99)

## 2018-03-24 LAB — HCV RT-PCR, QUANT (NON-GRAPH): Hepatitis C Quantitation: NOT DETECTED IU/mL

## 2018-03-24 LAB — HEPATITIS C VRS RNA DETECT BY PCR-QUAL: HEPATITIS C VRS RNA BY PCR-QUAL: NEGATIVE

## 2018-03-24 LAB — HCV AB W REFLEX TO QUANT PCR: HCV Ab: >11 s/co ratio — ABNORMAL HIGH (ref 0.0–0.9)

## 2018-03-24 LAB — MAGNESIUM: MAGNESIUM: 1.9 mg/dL (ref 1.7–2.4)

## 2018-03-24 MED ORDER — INSULIN ASPART 100 UNIT/ML ~~LOC~~ SOLN
0.0000 [IU] | Freq: Three times a day (TID) | SUBCUTANEOUS | Status: DC
  Administered 2018-03-25: 3 [IU] via SUBCUTANEOUS
  Administered 2018-03-25: 5 [IU] via SUBCUTANEOUS
  Administered 2018-03-25: 2 [IU] via SUBCUTANEOUS
  Administered 2018-03-26: 7 [IU] via SUBCUTANEOUS
  Administered 2018-03-26: 5 [IU] via SUBCUTANEOUS
  Administered 2018-03-26: 1 [IU] via SUBCUTANEOUS
  Administered 2018-03-27: 3 [IU] via SUBCUTANEOUS
  Administered 2018-03-27: 1 [IU] via SUBCUTANEOUS

## 2018-03-24 MED ORDER — POTASSIUM CHLORIDE CRYS ER 20 MEQ PO TBCR
40.0000 meq | EXTENDED_RELEASE_TABLET | Freq: Once | ORAL | Status: AC
  Administered 2018-03-24: 40 meq via ORAL
  Filled 2018-03-24: qty 2

## 2018-03-24 MED ORDER — CLONIDINE HCL 0.1 MG PO TABS
0.1000 mg | ORAL_TABLET | Freq: Three times a day (TID) | ORAL | Status: DC
Start: 2018-03-24 — End: 2018-03-26
  Administered 2018-03-24 – 2018-03-26 (×5): 0.1 mg via ORAL
  Filled 2018-03-24 (×5): qty 1

## 2018-03-24 MED ORDER — INSULIN GLARGINE 100 UNIT/ML ~~LOC~~ SOLN
20.0000 [IU] | Freq: Every day | SUBCUTANEOUS | Status: DC
  Administered 2018-03-25: 20 [IU] via SUBCUTANEOUS
  Filled 2018-03-24: qty 0.2

## 2018-03-24 MED ORDER — DEXTROSE 50 % IV SOLN
INTRAVENOUS | Status: AC
  Administered 2018-03-24: 50 mL
  Filled 2018-03-24: qty 50

## 2018-03-24 MED ORDER — CLONAZEPAM 0.125 MG PO TBDP
0.5000 mg | ORAL_TABLET | Freq: Two times a day (BID) | ORAL | Status: DC | PRN
  Administered 2018-03-30: 0.5 mg via ORAL
  Filled 2018-03-24: qty 1

## 2018-03-24 NOTE — Progress Notes (Signed)
Physical Therapy Wound Treatment Patient Details  Name: Othar Curto MRN: [id number] Date of Birth: Dec 31, 1975  Today's Date: 03/24/2018 Time: 2831-5176 Time Calculation (min): 73 min  Subjective  Subjective: Lethargic and slurring words. Agreeable but asking for pain meds. None available but pt slept throughout treatment.  Patient and Family Stated Goals: Heal wound Prior Treatments: 03/22/18 I&D lumbar wounds  Pain Score: Asleep throughout treatment  Wound Assessment  Wound / Incision (Open or Dehisced) 03/23/18 Incision - Open Lumbar Upper (Active)  Dressing Type ABD;Gauze (Comment);Moist to dry 03/24/2018  1:02 PM  Dressing Changed Changed 03/24/2018  1:02 PM  Dressing Status Clean;Dry;Intact;New drainage 03/24/2018  1:02 PM  Dressing Change Frequency Daily 03/24/2018  1:02 PM  Site / Wound Assessment Yellow;Pink;Pale 03/24/2018  1:02 PM  % Wound base Red or Granulating 50% 03/24/2018  1:02 PM  % Wound base Yellow/Fibrinous Exudate 50% 03/24/2018  1:02 PM  % Wound base Black/Eschar 0% 03/24/2018  1:02 PM  % Wound base Other/Granulation Tissue (Comment) 0% 03/24/2018  1:02 PM  Peri-wound Assessment Pink;Induration 03/24/2018  1:02 PM  Wound Length (cm) 3 cm 03/23/2018  1:00 PM  Wound Width (cm) 1.7 cm 03/23/2018  1:00 PM  Wound Depth (cm) 1.4 cm 03/23/2018  1:00 PM  Wound Volume (cm^3) 7.14 cm^3 03/23/2018  1:00 PM  Wound Surface Area (cm^2) 5.1 cm^2 03/23/2018  1:00 PM  Undermining (cm) 3.5cm 11-12 o'clock; 4.1cm 12-1 o'clock; 2.6cm 3'oclock;2.5cm 5 o'clock; 2.9cm 6 o'clock; 2.5cm 7 o'clock 03/23/2018  1:00 PM  Margins Unattached edges (unapproximated) 03/24/2018  1:02 PM  Closure None 03/24/2018  1:02 PM  Drainage Amount Copious 03/24/2018  1:02 PM  Drainage Description Purulent;Odor 03/24/2018  1:02 PM  Treatment Debridement (Selective);Hydrotherapy (Pulse lavage);Packing (Saline gauze) 03/24/2018  1:02 PM     Wound / Incision (Open or Dehisced) 03/23/18 Incision - Open Lumbar Lower (Active)   Dressing Type ABD;Gauze (Comment);Moist to dry 03/24/2018  1:02 PM  Dressing Changed Changed 03/24/2018  1:02 PM  Dressing Status Clean;Dry;Intact 03/24/2018  1:02 PM  Dressing Change Frequency Daily 03/24/2018  1:02 PM  Site / Wound Assessment Yellow;Pink 03/24/2018  1:02 PM  % Wound base Red or Granulating 40% 03/24/2018  1:02 PM  % Wound base Yellow/Fibrinous Exudate 60% 03/24/2018  1:02 PM  % Wound base Black/Eschar 0% 03/24/2018  1:02 PM  % Wound base Other/Granulation Tissue (Comment) 0% 03/24/2018  1:02 PM  Peri-wound Assessment Pink;Induration 03/24/2018  1:02 PM  Wound Length (cm) 3.3 cm 03/23/2018  1:00 PM  Wound Width (cm) 1.8 cm 03/23/2018  1:00 PM  Wound Depth (cm) 1.5 cm 03/23/2018  1:00 PM  Wound Volume (cm^3) 8.91 cm^3 03/23/2018  1:00 PM  Wound Surface Area (cm^2) 5.94 cm^2 03/23/2018  1:00 PM  Undermining (cm) 2.8cm 12 o'clock; 3.0cm 3 o'clock; 2.2cm 3-6 o'clock;  4.5cm 9-10 o'clock; 4.8cm 11 o'clock 03/23/2018  1:00 PM  Margins Unattached edges (unapproximated) 03/24/2018  1:02 PM  Closure None 03/24/2018  1:02 PM  Drainage Amount Moderate 03/24/2018  1:02 PM  Drainage Description Purulent;No odor 03/24/2018  1:02 PM  Treatment Debridement (Selective);Hydrotherapy (Pulse lavage);Packing (Saline gauze) 03/24/2018  1:02 PM      Hydrotherapy Pulsed lavage therapy - wound location: x2 lumbar wounds Pulsed Lavage with Suction (psi): 12 psi Pulsed Lavage with Suction - Normal Saline Used: 2000 mL Pulsed Lavage Tip: Tip with splash shield Selective Debridement Selective Debridement - Location: x2 lumbar wounds Selective Debridement - Tools Used: Forceps;Scissors Selective Debridement - Tissue Removed: yellow necrotic/unviable  tissue   Wound Assessment and Plan  Wound Therapy - Assess/Plan/Recommendations Wound Therapy - Clinical Statement: Noted after pulsed lavage both wounds had an increase in purulent drainage. Significant amount draining from upper wound. Discussed on the phone with  PA, Jerene Pitch who is planning to look at the wounds with [place] tomorrow. This patient will benefit from continued hydrotherapy for selective removal of unviable tissue, decrease bioburden, and promote wound bed healing.  Wound Therapy - Functional Problem List: Acute pain, decreased tolerance for position changes Factors Delaying/Impairing Wound Healing: Diabetes Mellitus;Infection - systemic/local Hydrotherapy Plan: Debridement;Dressing change;Patient/family education;Pulsatile lavage with suction Wound Therapy - Frequency: 6X / week Wound Therapy - Follow Up Recommendations: Home health RN Wound Plan: See above  Wound Therapy Goals- Improve the function of patient's integumentary system by progressing the wound(s) through the phases of wound healing (inflammation - proliferation - remodeling) by: Decrease Necrotic Tissue to: 0% both wounds Decrease Necrotic Tissue - Progress: Progressing toward goal Increase Granulation Tissue to: 100% both wounds Increase Granulation Tissue - Progress: Progressing toward goal Improve Drainage Characteristics: Min;Serous Improve Drainage Characteristics - Progress: Not progressing Goals/treatment plan/discharge plan were made with and agreed upon by patient/family: Yes Time For Goal Achievement: 7 days Wound Therapy - Potential for Goals: Good  Goals will be updated until maximal potential achieved or discharge criteria met.  Discharge criteria: when goals achieved, discharge from hospital, MD decision/surgical intervention, no progress towards goals, refusal/missing three consecutive treatments without notification or medical reason.  GP     Thelma Comp 03/24/2018, 1:12 PM   Rolinda Roan, PT, DPT Acute Rehabilitation Services Pager: [phone] Office: [phone]

## 2018-03-24 NOTE — Progress Notes (Addendum)
Subjective: Patient reports "I don't hurt"  Objective: Vital signs in last 24 hours: Temp:  [98 F (36.7 C)-100.8 F (38.2 C)] 98.9 F (37.2 C) (09/25 0623) Pulse Rate:  [84-115] 115 (09/25 0007) Resp:  [13-19] 19 (09/25 0007) BP: (114-148)/(73-92) 148/92 (09/25 0007) SpO2:  [95 %-97 %] 95 % (09/25 0007)  Intake/Output from previous day: 09/24 0701 - 09/25 0700 In: 3326.2 [P.O.:1822; I.V.:1204.2; IV Piggyback:300] Out: 304 [Urine:303; Stool:1] Intake/Output this shift: Total I/O In: 10 [I.V.:10] Out: 600 [Urine:600]  Awakens to voice. Reports no pain. Exam unchanged: good strength BUE and hand intrinsics and BLE. Pt acknowledges plans for IVAB.   Lab Results: Recent Labs    03/23/18 0439 03/24/18 0434  WBC 21.2* 16.7*  HGB 11.0* 11.4*  HCT 34.9* 35.7*  PLT 311 293   BMET Recent Labs    03/23/18 0439 03/24/18 0434  NA 138 140  K 3.1* 2.9*  CL 106 104  CO2 22 24  GLUCOSE 281* 174*  BUN 18 17  CREATININE 0.73 0.64  CALCIUM 9.0 9.3    Studies/Results: Mr Cervical Spine W Wo Contrast  Result Date: 03/22/2018 CLINICAL DATA:  Spine infection. EXAM: MRI TOTAL SPINE WITHOUT AND WITH CONTRAST TECHNIQUE: Multisequence MR imaging of the spine from the cervical spine to the sacrum was performed prior to and following IV contrast administration. CONTRAST:  6 cc Gadavist intravenous COMPARISON:  CT of the thoracic and lumbar spine from 2 days ago FINDINGS: MRI CERVICAL SPINE FINDINGS Alignment: Normal Vertebrae: No evidence of osseous infection. Canal/Cord: There is extensive spinal fluid collection preferentially in the ventral but also in the right more than left dorsal canal. Subarachnoid space is diffusely effaced. Maximal thickness is posterior to C2 at 9 mm. No cord signal abnormality. Posterior Fossa, vertebral arteries, paraspinal tissues: No retropharyngeal or other discrete soft tissue collection. Disc levels: C2-3: Unremarkable. C3-4: Small left foraminal protrusion  with moderate narrowing C4-5: Unremarkable. C5-6: Disc narrowing and bulging with asymmetric left uncovertebral spurring. Left foraminal impingement C6-7: Right foraminal protrusion mild narrowing. C7-T1:Unremarkable. MRI THORACIC SPINE FINDINGS Alignment:  Normal Vertebrae: No evidence of osteomyelitis or discitis. T12 butterfly vertebra. Canal/Cord: Cervical ventral epidural collection continues throughout the thoracic levels. There is also a focal dorsal component at T3-4 to T5-6, where thecal sac effacement is accentuated and there is cord flattening. No cord edema. Paraspinal and other soft tissues: Paraspinous phlegmon on the left at T8-T12, with new complex left pleural effusion and lower lobe atelectasis Disc levels: T5-6 central disc protrusion. MRI LUMBAR SPINE FINDINGS Segmentation:  5 lumbar type vertebral bodies Alignment:  Grade 1 anterolisthesis at L5-S1. Vertebrae: Marrow edema and heterogeneous enhancement within the L2, L3, and L4 spinous processes. No discitis or facet edema. Conus medullaris: Extends to the L1 level and is non edematous. There is extensive epidural collection completely effacing the thecal sac throughout the lumbar spine until L4-5 and below where the collection becomes ventral and right eccentric. The infection communicates with extensive bilateral abscess within the intrinsic back muscles via the interspinous space at L3-4. Patient had recent subcutaneous collection and left buttocks collection drainage, with packing seen in place. This midline, upper subcutaneous collection communicates with the paravertebral abscess along its superior margin based on postcontrast axial images. Paraspinal and other soft tissues: As above.  Distended bladder Disc levels: Chronic bilateral pars defects at L5. Critical Value/emergent results were called by telephone at the time of interpretation on 03/22/2018 at 3:04 pm to Dr. Thedore Mins , who verbally acknowledged  these results. IMPRESSION: 1.  Epidural abscess from C2 to sacrum as described. Maximal cord compression from T3-4 to T5-6. The thecal sac is completely effaced from L1 to L4-5. At the L3-4 interspinous space the spinal abscess communicates with large bilateral abscesses within the intrinsic back muscles. There is osteomyelitis of the L2, L3, and L4 spinous processes. No discitis or facet arthritis. 2. Left paravertebral abscess along the lower thoracic spine with small left empyema that is new from CT 2 days ago. 3. Distended bladder Electronically Signed   By: Marnee Spring M.D.   On: 03/22/2018 15:11   Mr Thoracic Spine W Wo Contrast  Result Date: 03/22/2018 CLINICAL DATA:  Spine infection. EXAM: MRI TOTAL SPINE WITHOUT AND WITH CONTRAST TECHNIQUE: Multisequence MR imaging of the spine from the cervical spine to the sacrum was performed prior to and following IV contrast administration. CONTRAST:  6 cc Gadavist intravenous COMPARISON:  CT of the thoracic and lumbar spine from 2 days ago FINDINGS: MRI CERVICAL SPINE FINDINGS Alignment: Normal Vertebrae: No evidence of osseous infection. Canal/Cord: There is extensive spinal fluid collection preferentially in the ventral but also in the right more than left dorsal canal. Subarachnoid space is diffusely effaced. Maximal thickness is posterior to C2 at 9 mm. No cord signal abnormality. Posterior Fossa, vertebral arteries, paraspinal tissues: No retropharyngeal or other discrete soft tissue collection. Disc levels: C2-3: Unremarkable. C3-4: Small left foraminal protrusion with moderate narrowing C4-5: Unremarkable. C5-6: Disc narrowing and bulging with asymmetric left uncovertebral spurring. Left foraminal impingement C6-7: Right foraminal protrusion mild narrowing. C7-T1:Unremarkable. MRI THORACIC SPINE FINDINGS Alignment:  Normal Vertebrae: No evidence of osteomyelitis or discitis. T12 butterfly vertebra. Canal/Cord: Cervical ventral epidural collection continues throughout the thoracic  levels. There is also a focal dorsal component at T3-4 to T5-6, where thecal sac effacement is accentuated and there is cord flattening. No cord edema. Paraspinal and other soft tissues: Paraspinous phlegmon on the left at T8-T12, with new complex left pleural effusion and lower lobe atelectasis Disc levels: T5-6 central disc protrusion. MRI LUMBAR SPINE FINDINGS Segmentation:  5 lumbar type vertebral bodies Alignment:  Grade 1 anterolisthesis at L5-S1. Vertebrae: Marrow edema and heterogeneous enhancement within the L2, L3, and L4 spinous processes. No discitis or facet edema. Conus medullaris: Extends to the L1 level and is non edematous. There is extensive epidural collection completely effacing the thecal sac throughout the lumbar spine until L4-5 and below where the collection becomes ventral and right eccentric. The infection communicates with extensive bilateral abscess within the intrinsic back muscles via the interspinous space at L3-4. Patient had recent subcutaneous collection and left buttocks collection drainage, with packing seen in place. This midline, upper subcutaneous collection communicates with the paravertebral abscess along its superior margin based on postcontrast axial images. Paraspinal and other soft tissues: As above.  Distended bladder Disc levels: Chronic bilateral pars defects at L5. Critical Value/emergent results were called by telephone at the time of interpretation on 03/22/2018 at 3:04 pm to Dr. Thedore Mins , who verbally acknowledged these results. IMPRESSION: 1. Epidural abscess from C2 to sacrum as described. Maximal cord compression from T3-4 to T5-6. The thecal sac is completely effaced from L1 to L4-5. At the L3-4 interspinous space the spinal abscess communicates with large bilateral abscesses within the intrinsic back muscles. There is osteomyelitis of the L2, L3, and L4 spinous processes. No discitis or facet arthritis. 2. Left paravertebral abscess along the lower thoracic spine  with small left empyema that is new from CT  2 days ago. 3. Distended bladder Electronically Signed   By: Marnee Spring M.D.   On: 03/22/2018 15:11   Mr Lumbar Spine W Wo Contrast  Result Date: 03/22/2018 CLINICAL DATA:  Spine infection. EXAM: MRI TOTAL SPINE WITHOUT AND WITH CONTRAST TECHNIQUE: Multisequence MR imaging of the spine from the cervical spine to the sacrum was performed prior to and following IV contrast administration. CONTRAST:  6 cc Gadavist intravenous COMPARISON:  CT of the thoracic and lumbar spine from 2 days ago FINDINGS: MRI CERVICAL SPINE FINDINGS Alignment: Normal Vertebrae: No evidence of osseous infection. Canal/Cord: There is extensive spinal fluid collection preferentially in the ventral but also in the right more than left dorsal canal. Subarachnoid space is diffusely effaced. Maximal thickness is posterior to C2 at 9 mm. No cord signal abnormality. Posterior Fossa, vertebral arteries, paraspinal tissues: No retropharyngeal or other discrete soft tissue collection. Disc levels: C2-3: Unremarkable. C3-4: Small left foraminal protrusion with moderate narrowing C4-5: Unremarkable. C5-6: Disc narrowing and bulging with asymmetric left uncovertebral spurring. Left foraminal impingement C6-7: Right foraminal protrusion mild narrowing. C7-T1:Unremarkable. MRI THORACIC SPINE FINDINGS Alignment:  Normal Vertebrae: No evidence of osteomyelitis or discitis. T12 butterfly vertebra. Canal/Cord: Cervical ventral epidural collection continues throughout the thoracic levels. There is also a focal dorsal component at T3-4 to T5-6, where thecal sac effacement is accentuated and there is cord flattening. No cord edema. Paraspinal and other soft tissues: Paraspinous phlegmon on the left at T8-T12, with new complex left pleural effusion and lower lobe atelectasis Disc levels: T5-6 central disc protrusion. MRI LUMBAR SPINE FINDINGS Segmentation:  5 lumbar type vertebral bodies Alignment:  Grade 1  anterolisthesis at L5-S1. Vertebrae: Marrow edema and heterogeneous enhancement within the L2, L3, and L4 spinous processes. No discitis or facet edema. Conus medullaris: Extends to the L1 level and is non edematous. There is extensive epidural collection completely effacing the thecal sac throughout the lumbar spine until L4-5 and below where the collection becomes ventral and right eccentric. The infection communicates with extensive bilateral abscess within the intrinsic back muscles via the interspinous space at L3-4. Patient had recent subcutaneous collection and left buttocks collection drainage, with packing seen in place. This midline, upper subcutaneous collection communicates with the paravertebral abscess along its superior margin based on postcontrast axial images. Paraspinal and other soft tissues: As above.  Distended bladder Disc levels: Chronic bilateral pars defects at L5. Critical Value/emergent results were called by telephone at the time of interpretation on 03/22/2018 at 3:04 pm to Dr. Thedore Mins , who verbally acknowledged these results. IMPRESSION: 1. Epidural abscess from C2 to sacrum as described. Maximal cord compression from T3-4 to T5-6. The thecal sac is completely effaced from L1 to L4-5. At the L3-4 interspinous space the spinal abscess communicates with large bilateral abscesses within the intrinsic back muscles. There is osteomyelitis of the L2, L3, and L4 spinous processes. No discitis or facet arthritis. 2. Left paravertebral abscess along the lower thoracic spine with small left empyema that is new from CT 2 days ago. 3. Distended bladder Electronically Signed   By: Marnee Spring M.D.   On: 03/22/2018 15:11    Assessment/Plan:   LOS: 4 days  Continue supportive care.   Georgiann Cocker 03/24/2018, 9:37 AM   [name] neurologically stable.  Please call with questions.

## 2018-03-24 NOTE — Progress Notes (Signed)
PROGRESS NOTE        PATIENT DETAILS Name: Tony Long Age: 42 y.o. Sex: male Date of Birth: [birth year] Admit Date: 03/20/2018 Admitting Physician Kendell Bane, MD ZOX:[id number], Rolm Gala, FNP  Brief Narrative: [name] a 42 y.o. male history of IVDA, DM-2, hypertension admitted for worsening back pain-further evaluation revealed MSSA bacteremia with tricuspid valve endocarditis, epidural abscess involving C2 through sacrum, and numerous soft tissue back abscesses.  Evaluated by general surgery-underwent I&D of her lower back soft tissue abscess, evaluated by neurosurgery-not felt to be a candidate for decompressive surgery due to extensive nature of the disease and lack of any significant neurological findings.  ID following with plans to continue Ancef with stop date of 05/25/2018.  See below for further details  Subjective: Lying comfortably in bed-denies any lower extremity weakness, saddle anesthesia.  Denies any chest pain or shortness of breath.  Assessment/Plan: MSSA bacteremia with tricuspid valve endocarditis, extensive epidural abscess (C2 through sacrum) and large soft tissue lower back abscess: Remains stable-no concerning neurological findings-leukocytosis downtrending-ID following-continue Ancef with stop date of 11/26.  Per neurosurgery-no need for decompressive surgery at this point due to the extensive nature of the epidural abscess and lack of neurological findings.  Underwent I&D by general surgery of back abscesses on 9/23.  General surgery and PT following for wound care.  Due to active IVDA-clearly not a candidate for outpatient IV antimicrobial therapy.  Will either need to remain inpatient or transfer to SNF.  Patient aware of extensive nature of infection with life-threatening/life disabling effects including quadriplegia-if he does not complete course of treatment.  DM-2: 2 episodes of hypoglycemia this afternoon-nursing staff-did not  eat breakfast well-will discontinue a.m. dose of Lantus and monitor.  Hypertension: Controlled-continue clonidine and metoprolol.  We will plan to slowly taper off clonidine once he is out of withdrawal symptoms.  Withdrawal symptoms: Urine drug screen positive for opiates and cocaine-started on Suboxone, clonidine and Catapres-he still has significant pain with dressing changes-therefore will stop Suboxone and continue with as needed narcotics for now-once he is more stable/less pain with dressing changes-we can then contemplate resuming Suboxone.  If he develops significant withdrawal symptoms-may be best to put him on methadone.  Polysubstance abuse/IVDA: Counseled earlier this morning  DVT Prophylaxis: Prophylactic heparin  Code Status: Full code  Family Communication: None/ at bedside  Disposition Plan: Remain inpatient-will require several weeks of hospitalization-if SNF placement not possible.  Antimicrobial agents: Anti-infectives (From admission, onward)   Start     Dose/Rate Route Frequency Ordered Stop   03/21/18 0930  ceFAZolin (ANCEF) IVPB 2g/100 mL premix     2 g 200 mL/hr over 30 Minutes Intravenous Every 8 hours 03/21/18 0920        Procedures: 9/23>> TEE 9/22>> PICC line  CONSULTS:  ID, general surgery and Neurosurgery  Time spent: 25- minutes-Greater than 50% of this time was spent in counseling, explanation of diagnosis, planning of further management, and coordination of care.  MEDICATIONS: Scheduled Meds: . buprenorphine-naloxone  2 tablet Sublingual BID  . clonazepam  1 mg Oral BID  . cloNIDine  0.2 mg Oral TID  . cyclobenzaprine  5 mg Oral TID  . gabapentin  300 mg Oral TID  . heparin injection (subcutaneous)  5,000 Units Subcutaneous Q8H  . insulin aspart  0-24 Units Subcutaneous TID WC  . insulin glargine  20 Units Subcutaneous BID  . metoprolol tartrate  50 mg Oral BID  . nicotine  21 mg Transdermal Daily  . polyethylene glycol  17 g Oral  BID  . sodium chloride flush  10-40 mL Intracatheter Q12H   Continuous Infusions: .  ceFAZolin (ANCEF) IV 2 g (03/24/18 1332)  . magnesium sulfate 1 - 4 g bolus IVPB     PRN Meds:.hydrALAZINE, LORazepam, magnesium hydroxide, metoprolol tartrate, oxyCODONE-acetaminophen   PHYSICAL EXAM: Vital signs: Vitals:   03/24/18 0357 03/24/18 0623 03/24/18 1018 03/24/18 1323  BP:    117/69  Pulse:      Resp:   20   Temp: (!) 100.8 F (38.2 C) 98.9 F (37.2 C) 97.7 F (36.5 C) 98.2 F (36.8 C)  TempSrc: Oral  Oral Axillary  SpO2:   95% 97%  Weight:      Height:       Filed Weights   03/21/18 0602  Weight: 68 kg   Body mass index is 20.92 kg/m.   General appearance :Awake, alert, not in any distress.  Eyes:.Pink conjunctiva HEENT: Atraumatic and Normocephalic Neck: supple, no JVD. No cervical lymphadenopathy. No thyromegaly Resp:Good air entry bilaterally, no added sounds  CVS: S1 S2 regular, GI: Bowel sounds present, Non tender and not distended with no gaurding, rigidity or rebound.No organomegaly Extremities: B/L Lower Ext shows no edema, both legs are warm to touch Neurology:  speech clear,Non focal, sensation is grossly intact. Musculoskeletal:No digital cyanosis Skin:No Rash, warm and dry Wounds:N/A  I have personally reviewed following labs and imaging studies  LABORATORY DATA: CBC: Recent Labs  Lab 03/20/18 0759 03/21/18 1337 03/22/18 0339 03/23/18 0439 03/24/18 0434  WBC 22.2* 26.3* 25.3* 21.2* 16.7*  NEUTROABS 19.7*  --   --   --   --   HGB 10.5* 10.3* 11.2* 11.0* 11.4*  HCT 32.1* 34.8* 35.3* 34.9* 35.7*  MCV 95.6 103.6* 97.0 96.4 97.3  PLT 388 374 386 311 293    Basic Metabolic Panel: Recent Labs  Lab 03/20/18 0759  03/21/18 1338 03/21/18 2228 03/22/18 0339 03/23/18 0439 03/24/18 0434  NA 138   < > 136 136 141 138 140  K 3.7   < > 3.4* 2.8* 2.9* 3.1* 2.9*  CL 92*   < > 102 106 107 106 104  CO2 18*   < > 9* 18* 21* 22 24  GLUCOSE 368*   < >  348* 143* 127* 281* 174*  BUN 11   < > 14 11 9 18 17   CREATININE 0.97   < > 1.53* 0.93 0.81 0.73 0.64  CALCIUM 9.2   < > 9.3 9.3 9.4 9.0 9.3  MG 1.8  --  1.6*  --  1.7 1.8 1.9   < > = values in this interval not displayed.    GFR: Estimated Creatinine Clearance: 116.9 mL/min (by C-G formula based on SCr of 0.64 mg/dL).  Liver Function Tests: Recent Labs  Lab 03/20/18 0759 03/22/18 0339 03/23/18 0439 03/24/18 0434  AST 13* 9* 9* 17  ALT 8 8 7 9   ALKPHOS 165* 154* 126 121  BILITOT 2.3* 0.8 0.5 0.5  PROT 7.5 7.0 6.4* 6.6  ALBUMIN 2.3* 2.0* 1.8* 1.9*   Recent Labs  Lab 03/20/18 0759  LIPASE 15   No results for input(s): AMMONIA in the last 168 hours.  Coagulation Profile: Recent Labs  Lab 03/20/18 0759 03/21/18 1337  INR 1.18 1.40    Cardiac Enzymes: Recent Labs  Lab 03/20/18 0759  CKTOTAL 19*    BNP (last 3 results) No results for input(s): PROBNP in the last 8760 hours.  HbA1C: No results for input(s): HGBA1C in the last 72 hours.  CBG: Recent Labs  Lab 03/23/18 2115 03/24/18 0734 03/24/18 1249 03/24/18 1320 03/24/18 1353  GLUCAP 181* 146* 42* 40* 135*    Lipid Profile: No results for input(s): CHOL, HDL, LDLCALC, TRIG, CHOLHDL, LDLDIRECT in the last 72 hours.  Thyroid Function Tests: No results for input(s): TSH, T4TOTAL, FREET4, T3FREE, THYROIDAB in the last 72 hours.  Anemia Panel: No results for input(s): VITAMINB12, FOLATE, FERRITIN, TIBC, IRON, RETICCTPCT in the last 72 hours.  Urine analysis:    Component Value Date/Time   COLORURINE YELLOW (A) 03/20/2018 1622   APPEARANCEUR CLEAR (A) 03/20/2018 1622   LABSPEC 1.015 03/20/2018 1622   PHURINE 5.0 03/20/2018 1622   GLUCOSEU >500 (A) 03/20/2018 1622   HGBUR SMALL (A) 03/20/2018 1622   BILIRUBINUR NEGATIVE 03/20/2018 1622   BILIRUBINUR small 02/26/2018 1202   KETONESUR 80 (A) 03/20/2018 1622   PROTEINUR NEGATIVE 03/20/2018 1622   UROBILINOGEN 1.0 02/26/2018 1202   UROBILINOGEN  1.0 12/11/2016 1020   NITRITE NEGATIVE 03/20/2018 1622   LEUKOCYTESUR NEGATIVE 03/20/2018 1622    Sepsis Labs: Lactic Acid, Venous    Component Value Date/Time   LATICACIDVEN 1.4 03/20/2018 1235    MICROBIOLOGY: Recent Results (from the past 240 hour(s))  Blood Culture (routine x 2)     Status: Abnormal   Collection Time: 03/20/18  7:59 AM  Result Value Ref Range Status   Specimen Description   Final    BLOOD EJ Performed at [facility], [address], [place], [place] [address]    Special Requests   Final    BOTTLES DRAWN AEROBIC AND ANAEROBIC Blood Culture adequate volume Performed at [facility], [address] [place], [place] [address]    Culture  Setup Time   Final    GRAM POSITIVE COCCI IN BOTH AEROBIC AND ANAEROBIC BOTTLES CRITICAL RESULT CALLED TO, READ BACK BY AND VERIFIED WITH: MATT MCBANE ON 03/21/18 AT 0001 QSD Performed at [facility], [address] [place], [place] [address]    Culture (A)  Final    STAPHYLOCOCCUS AUREUS SUSCEPTIBILITIES PERFORMED ON PREVIOUS CULTURE WITHIN THE LAST 5 DAYS. Performed at [facility], [address], [place], [place] [address]    Report Status 03/23/2018 FINAL  Final  Blood Culture (routine x 2)     Status: Abnormal   Collection Time: 03/20/18  7:59 AM  Result Value Ref Range Status   Specimen Description   Final    BLOOD EJ Performed at [facility], [address], [place], [place] [address]    Special Requests   Final    BOTTLES DRAWN AEROBIC AND ANAEROBIC Blood Culture adequate volume Performed at [facility], [address] [place], [place] [address]    Culture  Setup Time   Final    GRAM POSITIVE COCCI IN BOTH AEROBIC AND ANAEROBIC BOTTLES CRITICAL RESULT CALLED TO, READ BACK BY AND VERIFIED WITH: MATT MCBANE ON 03/21/18 AT 0001 QSD Performed at [facility], [address] [place], [place] [address]    Culture STAPHYLOCOCCUS AUREUS (A)   Final   Report Status 03/23/2018 FINAL  Final   Organism ID, Bacteria STAPHYLOCOCCUS AUREUS  Final      Susceptibility   Staphylococcus aureus - MIC*    CIPROFLOXACIN <=0.5 SENSITIVE Sensitive  ERYTHROMYCIN <=0.25 SENSITIVE Sensitive     GENTAMICIN <=0.5 SENSITIVE Sensitive     OXACILLIN <=0.25 SENSITIVE Sensitive     TETRACYCLINE <=1 SENSITIVE Sensitive     VANCOMYCIN <=0.5 SENSITIVE Sensitive     TRIMETH/SULFA <=10 SENSITIVE Sensitive     CLINDAMYCIN <=0.25 SENSITIVE Sensitive     RIFAMPIN <=0.5 SENSITIVE Sensitive     Inducible Clindamycin NEGATIVE Sensitive     * STAPHYLOCOCCUS AUREUS  Blood Culture ID Panel (Reflexed)     Status: Abnormal   Collection Time: 03/20/18  7:59 AM  Result Value Ref Range Status   Enterococcus species NOT DETECTED NOT DETECTED Final   Listeria monocytogenes NOT DETECTED NOT DETECTED Final   Staphylococcus species DETECTED (A) NOT DETECTED Final    Comment: CRITICAL RESULT CALLED TO, READ BACK BY AND VERIFIED WITH: MATT MCBANE ON 03/21/18 AT 0001 QSD    Staphylococcus aureus DETECTED (A) NOT DETECTED Final    Comment: Methicillin (oxacillin) susceptible Staphylococcus aureus (MSSA). Preferred therapy is anti staphylococcal beta lactam antibiotic (Cefazolin or Nafcillin), unless clinically contraindicated. CRITICAL RESULT CALLED TO, READ BACK BY AND VERIFIED WITH: MATT MCBANE ON 03/21/18 AT 0001 QSD    Methicillin resistance NOT DETECTED NOT DETECTED Final   Streptococcus species NOT DETECTED NOT DETECTED Final   Streptococcus agalactiae NOT DETECTED NOT DETECTED Final   Streptococcus pneumoniae NOT DETECTED NOT DETECTED Final   Streptococcus pyogenes NOT DETECTED NOT DETECTED Final   Acinetobacter baumannii NOT DETECTED NOT DETECTED Final   Enterobacteriaceae species NOT DETECTED NOT DETECTED Final   Enterobacter cloacae complex NOT DETECTED NOT DETECTED Final   Escherichia coli NOT DETECTED NOT DETECTED Final   Klebsiella oxytoca NOT DETECTED  NOT DETECTED Final   Klebsiella pneumoniae NOT DETECTED NOT DETECTED Final   Proteus species NOT DETECTED NOT DETECTED Final   Serratia marcescens NOT DETECTED NOT DETECTED Final   Haemophilus influenzae NOT DETECTED NOT DETECTED Final   Neisseria meningitidis NOT DETECTED NOT DETECTED Final   Pseudomonas aeruginosa NOT DETECTED NOT DETECTED Final   Candida albicans NOT DETECTED NOT DETECTED Final   Candida glabrata NOT DETECTED NOT DETECTED Final   Candida krusei NOT DETECTED NOT DETECTED Final   Candida parapsilosis NOT DETECTED NOT DETECTED Final   Candida tropicalis NOT DETECTED NOT DETECTED Final    Comment: Performed at [facility], [address] [place], [place] [address]  MRSA PCR Screening     Status: None   Collection Time: 03/20/18  3:50 PM  Result Value Ref Range Status   MRSA by PCR NEGATIVE NEGATIVE Final    Comment:        The GeneXpert MRSA Assay (FDA approved for NASAL specimens only), is one component of a comprehensive MRSA colonization surveillance program. It is not intended to diagnose MRSA infection nor to guide or monitor treatment for MRSA infections. Performed at [facility], [address], [place], [place] [address]   Anaerobic culture     Status: None (Preliminary result)   Collection Time: 03/22/18 10:36 AM  Result Value Ref Range Status   Specimen Description ABSCESS BACK  Final   Special Requests   Final    NONE Performed at [facility], [address], [place], [place] [address]    Gram Stain PENDING  Incomplete   Culture   Final    NO ANAEROBES ISOLATED; CULTURE IN PROGRESS FOR 5 DAYS   Report Status PENDING  Incomplete  Aerobic Culture (superficial specimen)  Status: None   Collection Time: 03/22/18 10:36 AM  Result Value Ref Range Status   Specimen Description ABSCESS BACK  Final   Special Requests NONE  Final   Gram Stain   Final    FEW WBC PRESENT,BOTH PMN AND MONONUCLEAR MODERATE GRAM POSITIVE  COCCI Performed at [facility], [address] [place], [place] [address]    Culture ABUNDANT STAPHYLOCOCCUS AUREUS  Final   Report Status 03/24/2018 FINAL  Final   Organism ID, Bacteria STAPHYLOCOCCUS AUREUS  Final      Susceptibility   Staphylococcus aureus - MIC*    CIPROFLOXACIN <=0.5 SENSITIVE Sensitive     ERYTHROMYCIN <=0.25 SENSITIVE Sensitive     GENTAMICIN <=0.5 SENSITIVE Sensitive     OXACILLIN 0.5 SENSITIVE Sensitive     TETRACYCLINE <=1 SENSITIVE Sensitive     VANCOMYCIN 1 SENSITIVE Sensitive     TRIMETH/SULFA <=10 SENSITIVE Sensitive     CLINDAMYCIN <=0.25 SENSITIVE Sensitive     RIFAMPIN <=0.5 SENSITIVE Sensitive     Inducible Clindamycin NEGATIVE Sensitive     * ABUNDANT STAPHYLOCOCCUS AUREUS    RADIOLOGY STUDIES/RESULTS: Ct Thoracic Spine Wo Contrast  Result Date: 03/20/2018 CLINICAL DATA:  Progressive back pain.  Swelling of the lower back. EXAM: CT THORACIC SPINE WITHOUT CONTRAST TECHNIQUE: Multidetector CT images of the thoracic were obtained using the standard protocol without intravenous contrast. COMPARISON:  None. FINDINGS: Alignment: Normal. 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 Paraspinal and other soft tissues: There is abnormal gas and fluid in the soft tissues of the right side of the base of the neck and in the right supraclavicular region. Paraspinal soft tissues appear normal throughout the thoracic spine. Disc levels: There is no evidence of disc protrusion or significant disc bulging or spinal or foraminal stenosis or other significant abnormality of the thoracic spine. IMPRESSION: 1. Evidence of cellulitis involving the right side of the base of the neck extending into the right supraclavicular region with fluid and gas in the soft tissues at the base of the right side of the neck. 2. No significant abnormality of the thoracic spine. 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 Electronically Signed   By: Francene Boyers M.D.   On: 03/20/2018  11:25   Ct Lumbar Spine Wo Contrast  Addendum Date: 03/20/2018   ADDENDUM REPORT: 03/20/2018 11:44 ADDENDUM: Critical Value/emergent results were called by telephone at the time of interpretation on 03/20/2018 at 11:30 am to Dr. Sharyn Creamer , who verbally acknowledged these results. Electronically Signed   By: Francene Boyers M.D.   On: 03/20/2018 11:44   Result Date: 03/20/2018 CLINICAL DATA:  Increasing low back pain and soft tissue swelling. EXAM: CT LUMBAR SPINE WITHOUT CONTRAST TECHNIQUE: Multidetector CT imaging of the lumbar spine was performed without intravenous contrast administration. Multiplanar CT image reconstructions were also generated. IV contrast could not be utilized due to the lack of an appropriate IV. COMPARISON:  None. FINDINGS: Segmentation: 5 lumbar type vertebrae. Alignment: Normal. Vertebrae: There is a moth-eaten appearance of the spinous processes of L3 and L4 which is worrisome for osteomyelitis. Bilateral pars defects at L5 with grade 1 spondylolisthesis. 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 Paraspinal and other soft tissues: There is an extensive abnormal fluid collection in the subcutaneous soft tissues of the posterior aspect of the back extending from approximately L1-2 to S3. This fluid collection is lobulated and measures approximately 20 x 9 x 2.5 cm. It is centered slightly to the left of midline and has a mass effect  upon the adjacent posterior paraspinal muscles. There is abnormal lucency in the underlying paraspinal muscles which could represent myositis. Disc levels: T11-12: No significant abnormality. Butterfly T12 vertebra. T12-L1: No significant abnormality. L1-2: Normal disc. Abnormal edema in the posterior paraspinal musculature with adjacent fluid collection in the subcutaneous fat of the posterior aspect of the back as described above. L2-3: Normal disc. L3-4: Normal disc. Lucency in the posterior paraspinal soft tissues extends to the posterior aspect of the  thecal sac on image 80 of series 4 but there is no discrete epidural abscess. L4-5: Normal disc.  No evidence of epidural abscess. L5-S1: Grade 1 spondylolisthesis. No disc bulging or protrusion. Bilateral pars defects. No visible epidural abscess. IMPRESSION: 1. Extensive abnormal fluid collection in the subcutaneous fat of the midline of the back with underlying marked abnormality of the posterior paraspinal musculature from L1-2 through S3. This is worrisome for subcutaneous abscess and myositis. 2. Moth-eaten appearance of the spinous processes of L3 and L4 consistent with osteomyelitis. 3. No discrete epidural abscess. However, the abnormal edema in the paraspinal musculature extends to the posterior aspect of the spinal canal at L3-4. 4. MRI with and without contrast may better define the extent of the soft tissue and infection and could detect epidural extension that is not apparent on this unenhanced CT scan. Electronically Signed: By: Francene Boyers M.D. On: 03/20/2018 11:18   Mr Cervical Spine W Wo Contrast  Result Date: 03/22/2018 CLINICAL DATA:  Spine infection. EXAM: MRI TOTAL SPINE WITHOUT AND WITH CONTRAST TECHNIQUE: Multisequence MR imaging of the spine from the cervical spine to the sacrum was performed prior to and following IV contrast administration. CONTRAST:  6 cc Gadavist intravenous COMPARISON:  CT of the thoracic and lumbar spine from 2 days ago FINDINGS: MRI CERVICAL SPINE FINDINGS Alignment: Normal Vertebrae: No evidence of osseous infection. Canal/Cord: There is extensive spinal fluid collection preferentially in the ventral but also in the right more than left dorsal canal. Subarachnoid space is diffusely effaced. Maximal thickness is posterior to C2 at 9 mm. No cord signal abnormality. Posterior Fossa, vertebral arteries, paraspinal tissues: No retropharyngeal or other discrete soft tissue collection. Disc levels: C2-3: Unremarkable. C3-4: Small left foraminal protrusion with  moderate narrowing C4-5: Unremarkable. C5-6: Disc narrowing and bulging with asymmetric left uncovertebral spurring. Left foraminal impingement C6-7: Right foraminal protrusion mild narrowing. C7-T1:Unremarkable. MRI THORACIC SPINE FINDINGS Alignment:  Normal Vertebrae: No evidence of osteomyelitis or discitis. T12 butterfly vertebra. Canal/Cord: Cervical ventral epidural collection continues throughout the thoracic levels. There is also a focal dorsal component at T3-4 to T5-6, where thecal sac effacement is accentuated and there is cord flattening. No cord edema. Paraspinal and other soft tissues: Paraspinous phlegmon on the left at T8-T12, with new complex left pleural effusion and lower lobe atelectasis Disc levels: T5-6 central disc protrusion. MRI LUMBAR SPINE FINDINGS Segmentation:  5 lumbar type vertebral bodies Alignment:  Grade 1 anterolisthesis at L5-S1. Vertebrae: Marrow edema and heterogeneous enhancement within the L2, L3, and L4 spinous processes. No discitis or facet edema. Conus medullaris: Extends to the L1 level and is non edematous. There is extensive epidural collection completely effacing the thecal sac throughout the lumbar spine until L4-5 and below where the collection becomes ventral and right eccentric. The infection communicates with extensive bilateral abscess within the intrinsic back muscles via the interspinous space at L3-4. Patient had recent subcutaneous collection and left buttocks collection drainage, with packing seen in place. This midline, upper subcutaneous collection communicates with the  paravertebral abscess along its superior margin based on postcontrast axial images. Paraspinal and other soft tissues: As above.  Distended bladder Disc levels: Chronic bilateral pars defects at L5. Critical Value/emergent results were called by telephone at the time of interpretation on 03/22/2018 at 3:04 pm to Dr. Thedore Mins , who verbally acknowledged these results. IMPRESSION: 1. Epidural  abscess from C2 to sacrum as described. Maximal cord compression from T3-4 to T5-6. The thecal sac is completely effaced from L1 to L4-5. At the L3-4 interspinous space the spinal abscess communicates with large bilateral abscesses within the intrinsic back muscles. There is osteomyelitis of the L2, L3, and L4 spinous processes. No discitis or facet arthritis. 2. Left paravertebral abscess along the lower thoracic spine with small left empyema that is new from CT 2 days ago. 3. Distended bladder Electronically Signed   By: Marnee Spring M.D.   On: 03/22/2018 15:11   Mr Thoracic Spine W Wo Contrast  Result Date: 03/22/2018 CLINICAL DATA:  Spine infection. EXAM: MRI TOTAL SPINE WITHOUT AND WITH CONTRAST TECHNIQUE: Multisequence MR imaging of the spine from the cervical spine to the sacrum was performed prior to and following IV contrast administration. CONTRAST:  6 cc Gadavist intravenous COMPARISON:  CT of the thoracic and lumbar spine from 2 days ago FINDINGS: MRI CERVICAL SPINE FINDINGS Alignment: Normal Vertebrae: No evidence of osseous infection. Canal/Cord: There is extensive spinal fluid collection preferentially in the ventral but also in the right more than left dorsal canal. Subarachnoid space is diffusely effaced. Maximal thickness is posterior to C2 at 9 mm. No cord signal abnormality. Posterior Fossa, vertebral arteries, paraspinal tissues: No retropharyngeal or other discrete soft tissue collection. Disc levels: C2-3: Unremarkable. C3-4: Small left foraminal protrusion with moderate narrowing C4-5: Unremarkable. C5-6: Disc narrowing and bulging with asymmetric left uncovertebral spurring. Left foraminal impingement C6-7: Right foraminal protrusion mild narrowing. C7-T1:Unremarkable. MRI THORACIC SPINE FINDINGS Alignment:  Normal Vertebrae: No evidence of osteomyelitis or discitis. T12 butterfly vertebra. Canal/Cord: Cervical ventral epidural collection continues throughout the thoracic levels.  There is also a focal dorsal component at T3-4 to T5-6, where thecal sac effacement is accentuated and there is cord flattening. No cord edema. Paraspinal and other soft tissues: Paraspinous phlegmon on the left at T8-T12, with new complex left pleural effusion and lower lobe atelectasis Disc levels: T5-6 central disc protrusion. MRI LUMBAR SPINE FINDINGS Segmentation:  5 lumbar type vertebral bodies Alignment:  Grade 1 anterolisthesis at L5-S1. Vertebrae: Marrow edema and heterogeneous enhancement within the L2, L3, and L4 spinous processes. No discitis or facet edema. Conus medullaris: Extends to the L1 level and is non edematous. There is extensive epidural collection completely effacing the thecal sac throughout the lumbar spine until L4-5 and below where the collection becomes ventral and right eccentric. The infection communicates with extensive bilateral abscess within the intrinsic back muscles via the interspinous space at L3-4. Patient had recent subcutaneous collection and left buttocks collection drainage, with packing seen in place. This midline, upper subcutaneous collection communicates with the paravertebral abscess along its superior margin based on postcontrast axial images. Paraspinal and other soft tissues: As above.  Distended bladder Disc levels: Chronic bilateral pars defects at L5. Critical Value/emergent results were called by telephone at the time of interpretation on 03/22/2018 at 3:04 pm to Dr. Thedore Mins , who verbally acknowledged these results. IMPRESSION: 1. Epidural abscess from C2 to sacrum as described. Maximal cord compression from T3-4 to T5-6. The thecal sac is completely effaced from L1 to L4-5.  At the L3-4 interspinous space the spinal abscess communicates with large bilateral abscesses within the intrinsic back muscles. There is osteomyelitis of the L2, L3, and L4 spinous processes. No discitis or facet arthritis. 2. Left paravertebral abscess along the lower thoracic spine with  small left empyema that is new from CT 2 days ago. 3. Distended bladder Electronically Signed   By: Marnee Spring M.D.   On: 03/22/2018 15:11   Mr Lumbar Spine W Wo Contrast  Result Date: 03/22/2018 CLINICAL DATA:  Spine infection. EXAM: MRI TOTAL SPINE WITHOUT AND WITH CONTRAST TECHNIQUE: Multisequence MR imaging of the spine from the cervical spine to the sacrum was performed prior to and following IV contrast administration. CONTRAST:  6 cc Gadavist intravenous COMPARISON:  CT of the thoracic and lumbar spine from 2 days ago FINDINGS: MRI CERVICAL SPINE FINDINGS Alignment: Normal Vertebrae: No evidence of osseous infection. Canal/Cord: There is extensive spinal fluid collection preferentially in the ventral but also in the right more than left dorsal canal. Subarachnoid space is diffusely effaced. Maximal thickness is posterior to C2 at 9 mm. No cord signal abnormality. Posterior Fossa, vertebral arteries, paraspinal tissues: No retropharyngeal or other discrete soft tissue collection. Disc levels: C2-3: Unremarkable. C3-4: Small left foraminal protrusion with moderate narrowing C4-5: Unremarkable. C5-6: Disc narrowing and bulging with asymmetric left uncovertebral spurring. Left foraminal impingement C6-7: Right foraminal protrusion mild narrowing. C7-T1:Unremarkable. MRI THORACIC SPINE FINDINGS Alignment:  Normal Vertebrae: No evidence of osteomyelitis or discitis. T12 butterfly vertebra. Canal/Cord: Cervical ventral epidural collection continues throughout the thoracic levels. There is also a focal dorsal component at T3-4 to T5-6, where thecal sac effacement is accentuated and there is cord flattening. No cord edema. Paraspinal and other soft tissues: Paraspinous phlegmon on the left at T8-T12, with new complex left pleural effusion and lower lobe atelectasis Disc levels: T5-6 central disc protrusion. MRI LUMBAR SPINE FINDINGS Segmentation:  5 lumbar type vertebral bodies Alignment:  Grade 1  anterolisthesis at L5-S1. Vertebrae: Marrow edema and heterogeneous enhancement within the L2, L3, and L4 spinous processes. No discitis or facet edema. Conus medullaris: Extends to the L1 level and is non edematous. There is extensive epidural collection completely effacing the thecal sac throughout the lumbar spine until L4-5 and below where the collection becomes ventral and right eccentric. The infection communicates with extensive bilateral abscess within the intrinsic back muscles via the interspinous space at L3-4. Patient had recent subcutaneous collection and left buttocks collection drainage, with packing seen in place. This midline, upper subcutaneous collection communicates with the paravertebral abscess along its superior margin based on postcontrast axial images. Paraspinal and other soft tissues: As above.  Distended bladder Disc levels: Chronic bilateral pars defects at L5. Critical Value/emergent results were called by telephone at the time of interpretation on 03/22/2018 at 3:04 pm to Dr. Thedore Mins , who verbally acknowledged these results. IMPRESSION: 1. Epidural abscess from C2 to sacrum as described. Maximal cord compression from T3-4 to T5-6. The thecal sac is completely effaced from L1 to L4-5. At the L3-4 interspinous space the spinal abscess communicates with large bilateral abscesses within the intrinsic back muscles. There is osteomyelitis of the L2, L3, and L4 spinous processes. No discitis or facet arthritis. 2. Left paravertebral abscess along the lower thoracic spine with small left empyema that is new from CT 2 days ago. 3. Distended bladder Electronically Signed   By: Marnee Spring M.D.   On: 03/22/2018 15:11   [place] Ekg Site Rite  Result Date: 03/21/2018  If [employer] not attached, placement could not be confirmed due to current cardiac rhythm.    LOS: 4 days   Jeoffrey Massed, MD  [facility]  If 7PM-7AM, please contact night-coverage  Please page via  [email or web address]-click on MD name and type text message  03/24/2018, 2:13 PM

## 2018-03-24 NOTE — Progress Notes (Signed)
Hypoglycemic Event  CBG: 42  Treatment: Given 240 cc sprite and 50 mL dextrose 50% IV.   Symptoms: Lethargic and difficult to arouse  Follow-up CBG: Time: 1353 CBG Result: 135  Possible Reasons for Event: Did not eat much for breakfast.  Comments/MD notified: Ghimire aware.    Jon GillsElisa R Nannette Zill

## 2018-03-24 NOTE — Progress Notes (Signed)
Inpatient Diabetes Program Recommendations  AACE/ADA: New Consensus Statement on Inpatient Glycemic Control (2015)  Target Ranges:  Prepandial:   less than 140 mg/dL      Peak postprandial:   less than 180 mg/dL (1-2 hours)      Critically ill patients:  140 - 180 mg/dL   Lab Results  Component Value Date   GLUCAP 135 (H) 03/24/2018   HGBA1C 12.6 (A) 02/26/2018    Review of Glycemic ControlResults for PAZ, WINSETT (MRN [id number]) as of 03/24/2018 14:32  Ref. Range 03/23/2018 21:15 03/24/2018 07:34 03/24/2018 12:49 03/24/2018 13:20 03/24/2018 13:53  Glucose-Capillary Latest Ref Range: 70 - 99 mg/dL 782 (H) 956 (H) 42 (LL) 40 (LL) 135 (H)   Diabetes history: DM Outpatient Diabetes medications:  Lantus 40 units q HS, Humalog 10 units tid with meals (per patient he was supposed to be taking 20 units tid with meals), Metformin 500 mg bid Current orders for Inpatient glycemic control:  TCTS Novolog tid with meals, Lantus 20 units bid Inpatient Diabetes Program Recommendations:   Please consider reducing Novolog correction to sensitive tid with meals.  Text page sent. Thanks,  Beryl Meager, RN, BC-ADM Inpatient Diabetes Coordinator Pager [phone] (8a-5p)

## 2018-03-24 NOTE — Progress Notes (Signed)
INFECTIOUS DISEASE PROGRESS NOTE  ID: Tony Long is a 42 y.o. male with  Principal Problem:   Opioid use disorder, severe, dependence ([place]) Active Problems:   Non compliance w medication regimen   Chronic hepatitis C without hepatic coma (HCC)   Osteomyelitis ([place])   Back abscess   Bacteremia due to Gram-positive bacteria  Subjective: No complaints.   Abtx:  Anti-infectives (From admission, onward)   Start     Dose/Rate Route Frequency Ordered Stop   03/21/18 0930  ceFAZolin (ANCEF) IVPB 2g/100 mL premix     2 g 200 mL/hr over 30 Minutes Intravenous Every 8 hours 03/21/18 0920        Medications:  Scheduled: . buprenorphine-naloxone  2 tablet Sublingual BID  . clonazepam  1 mg Oral BID  . cloNIDine  0.2 mg Oral TID  . cyclobenzaprine  5 mg Oral TID  . gabapentin  300 mg Oral TID  . heparin injection (subcutaneous)  5,000 Units Subcutaneous Q8H  . insulin aspart  0-24 Units Subcutaneous TID WC  . insulin glargine  20 Units Subcutaneous BID  . metoprolol tartrate  50 mg Oral BID  . nicotine  21 mg Transdermal Daily  . polyethylene glycol  17 g Oral BID  . potassium chloride  40 mEq Oral Once  . sodium chloride flush  10-40 mL Intracatheter Q12H    Objective: Vital signs in last 24 hours: Temp:  [98 F (36.7 C)-100.8 F (38.2 C)] 98.9 F (37.2 C) (09/25 0623) Pulse Rate:  [84-115] 115 (09/25 0007) Resp:  [13-19] 19 (09/25 0007) BP: (114-148)/(73-92) 148/92 (09/25 0007) SpO2:  [95 %-97 %] 95 % (09/25 0007)   General appearance: alert, cooperative and no distress Resp: clear to auscultation bilaterally Cardio: regular rate and rhythm GI: normal findings: bowel sounds normal and soft, non-tender Normal light touch BLE, normal dorsi/plantarflexion BLE  Lab Results Recent Labs    03/23/18 0439 03/24/18 0434  WBC 21.2* 16.7*  HGB 11.0* 11.4*  HCT 34.9* 35.7*  NA 138 140  K 3.1* 2.9*  CL 106 104  CO2 22 24  BUN 18 17  CREATININE 0.73 0.64    Liver Panel Recent Labs    03/23/18 0439 03/24/18 0434  PROT 6.4* 6.6  ALBUMIN 1.8* 1.9*  AST 9* 17  ALT 7 9  ALKPHOS 126 121  BILITOT 0.5 0.5   Sedimentation Rate No results for input(s): ESRSEDRATE in the last 72 hours. C-Reactive Protein No results for input(s): CRP in the last 72 hours.  Microbiology: Recent Results (from the past 240 hour(s))  Blood Culture (routine x 2)     Status: Abnormal   Collection Time: 03/20/18  7:59 AM  Result Value Ref Range Status   Specimen Description   Final    BLOOD EJ Performed at [facility], [address] [place], [place] [address]    Special Requests   Final    BOTTLES DRAWN AEROBIC AND ANAEROBIC Blood Culture adequate volume Performed at [facility], [address], [place], [place] [address]    Culture  Setup Time   Final    GRAM POSITIVE COCCI IN BOTH AEROBIC AND ANAEROBIC BOTTLES CRITICAL RESULT CALLED TO, READ BACK BY AND VERIFIED WITH: MATT MCBANE ON 03/21/18 AT 0001 QSD Performed at [name], [address] [place], [place] [address]    Culture (A)  Final    STAPHYLOCOCCUS AUREUS SUSCEPTIBILITIES PERFORMED ON PREVIOUS CULTURE WITHIN THE LAST 5 DAYS. Performed at [facility], [place] [address] [place], [place] [address]    Report Status 03/23/2018 FINAL  Final  Blood Culture (routine x 2)     Status: Abnormal   Collection Time: 03/20/18  7:59 AM  Result Value Ref Range Status   Specimen Description   Final    BLOOD EJ Performed at [facility], [address], [place], [place] [address]    Special Requests   Final    BOTTLES DRAWN AEROBIC AND ANAEROBIC Blood Culture adequate volume Performed at [facility], [address] [place], [place] [address]    Culture  Setup Time   Final    GRAM POSITIVE COCCI IN BOTH AEROBIC AND ANAEROBIC BOTTLES CRITICAL RESULT CALLED TO, READ BACK BY AND VERIFIED WITH: MATT MCBANE ON 03/21/18 AT 0001 QSD Performed at  [facility], [place] [address] [place],  [address]    Culture STAPHYLOCOCCUS AUREUS (A)  Final   Report Status 03/23/2018 FINAL  Final   Organism ID, Bacteria STAPHYLOCOCCUS AUREUS  Final      Susceptibility   Staphylococcus aureus - MIC*    CIPROFLOXACIN <=0.5 SENSITIVE Sensitive     ERYTHROMYCIN <=0.25 SENSITIVE Sensitive     GENTAMICIN <=0.5 SENSITIVE Sensitive     OXACILLIN <=0.25 SENSITIVE Sensitive     TETRACYCLINE <=1 SENSITIVE Sensitive     VANCOMYCIN <=0.5 SENSITIVE Sensitive     TRIMETH/SULFA <=10 SENSITIVE Sensitive     CLINDAMYCIN <=0.25 SENSITIVE Sensitive     RIFAMPIN <=0.5 SENSITIVE Sensitive     Inducible Clindamycin NEGATIVE Sensitive     * STAPHYLOCOCCUS AUREUS  Blood Culture ID Panel (Reflexed)     Status: Abnormal   Collection Time: 03/20/18  7:59 AM  Result Value Ref Range Status   Enterococcus species NOT DETECTED NOT DETECTED Final   Listeria monocytogenes NOT DETECTED NOT DETECTED Final   Staphylococcus species DETECTED (A) NOT DETECTED Final    Comment: CRITICAL RESULT CALLED TO, READ BACK BY AND VERIFIED WITH: MATT MCBANE ON 03/21/18 AT 0001 QSD    Staphylococcus aureus DETECTED (A) NOT DETECTED Final    Comment: Methicillin (oxacillin) susceptible Staphylococcus aureus (MSSA). Preferred therapy is anti staphylococcal beta lactam antibiotic (Cefazolin or Nafcillin), unless clinically contraindicated. CRITICAL RESULT CALLED TO, READ BACK BY AND VERIFIED WITH: MATT MCBANE ON 03/21/18 AT 0001 QSD    Methicillin resistance NOT DETECTED NOT DETECTED Final   Streptococcus species NOT DETECTED NOT DETECTED Final   Streptococcus agalactiae NOT DETECTED NOT DETECTED Final   Streptococcus pneumoniae NOT DETECTED NOT DETECTED Final   Streptococcus pyogenes NOT DETECTED NOT DETECTED Final   Acinetobacter baumannii NOT DETECTED NOT DETECTED Final   Enterobacteriaceae species NOT DETECTED NOT DETECTED Final   Enterobacter cloacae complex NOT DETECTED NOT  DETECTED Final   Escherichia coli NOT DETECTED NOT DETECTED Final   Klebsiella oxytoca NOT DETECTED NOT DETECTED Final   Klebsiella pneumoniae NOT DETECTED NOT DETECTED Final   Proteus species NOT DETECTED NOT DETECTED Final   Serratia marcescens NOT DETECTED NOT DETECTED Final   Haemophilus influenzae NOT DETECTED NOT DETECTED Final   Neisseria meningitidis NOT DETECTED NOT DETECTED Final   Pseudomonas aeruginosa NOT DETECTED NOT DETECTED Final   Candida albicans NOT DETECTED NOT DETECTED Final   Candida glabrata NOT DETECTED NOT DETECTED Final   Candida krusei NOT DETECTED NOT DETECTED Final   Candida parapsilosis NOT DETECTED NOT DETECTED Final   Candida tropicalis NOT DETECTED NOT DETECTED Final    Comment: Performed  at [facility], [address] [place], [place] [address]  MRSA PCR Screening     Status: None   Collection Time: 03/20/18  3:50 PM  Result Value Ref Range Status   MRSA by PCR NEGATIVE NEGATIVE Final    Comment:        The GeneXpert MRSA Assay (FDA approved for NASAL specimens only), is one component of a comprehensive MRSA colonization surveillance program. It is not intended to diagnose MRSA infection nor to guide or monitor treatment for MRSA infections. Performed at [facility], [place] [address], [place], [place] [address]   Aerobic Culture (superficial specimen)     Status: None (Preliminary result)   Collection Time: 03/22/18 10:36 AM  Result Value Ref Range Status   Specimen Description ABSCESS BACK  Final   Special Requests NONE  Final   Gram Stain   Final    FEW WBC PRESENT,BOTH PMN AND MONONUCLEAR MODERATE GRAM POSITIVE COCCI Performed at [facility], [address], [place],  [address]    Culture ABUNDANT STAPHYLOCOCCUS AUREUS  Final   Report Status PENDING  Incomplete    Studies/Results: Mr Cervical Spine W Wo Contrast  Result Date: 03/22/2018 CLINICAL DATA:  Spine infection. EXAM: MRI TOTAL SPINE WITHOUT  AND WITH CONTRAST TECHNIQUE: Multisequence MR imaging of the spine from the cervical spine to the sacrum was performed prior to and following IV contrast administration. CONTRAST:  6 cc Gadavist intravenous COMPARISON:  CT of the thoracic and lumbar spine from 2 days ago FINDINGS: MRI CERVICAL SPINE FINDINGS Alignment: Normal Vertebrae: No evidence of osseous infection. Canal/Cord: There is extensive spinal fluid collection preferentially in the ventral but also in the right more than left dorsal canal. Subarachnoid space is diffusely effaced. Maximal thickness is posterior to C2 at 9 mm. No cord signal abnormality. Posterior Fossa, vertebral arteries, paraspinal tissues: No retropharyngeal or other discrete soft tissue collection. Disc levels: C2-3: Unremarkable. C3-4: Small left foraminal protrusion with moderate narrowing C4-5: Unremarkable. C5-6: Disc narrowing and bulging with asymmetric left uncovertebral spurring. Left foraminal impingement C6-7: Right foraminal protrusion mild narrowing. C7-T1:Unremarkable. MRI THORACIC SPINE FINDINGS Alignment:  Normal Vertebrae: No evidence of osteomyelitis or discitis. T12 butterfly vertebra. Canal/Cord: Cervical ventral epidural collection continues throughout the thoracic levels. There is also a focal dorsal component at T3-4 to T5-6, where thecal sac effacement is accentuated and there is cord flattening. No cord edema. Paraspinal and other soft tissues: Paraspinous phlegmon on the left at T8-T12, with new complex left pleural effusion and lower lobe atelectasis Disc levels: T5-6 central disc protrusion. MRI LUMBAR SPINE FINDINGS Segmentation:  5 lumbar type vertebral bodies Alignment:  Grade 1 anterolisthesis at L5-S1. Vertebrae: Marrow edema and heterogeneous enhancement within the L2, L3, and L4 spinous processes. No discitis or facet edema. Conus medullaris: Extends to the L1 level and is non edematous. There is extensive epidural collection completely effacing the  thecal sac throughout the lumbar spine until L4-5 and below where the collection becomes ventral and right eccentric. The infection communicates with extensive bilateral abscess within the intrinsic back muscles via the interspinous space at L3-4. Patient had recent subcutaneous collection and left buttocks collection drainage, with packing seen in place. This midline, upper subcutaneous collection communicates with the paravertebral abscess along its superior margin based on postcontrast axial images. Paraspinal and other soft tissues: As above.  Distended bladder Disc levels: Chronic bilateral pars defects at L5. Critical Value/emergent results were called by telephone at the time  of interpretation on 03/22/2018 at 3:04 pm to Dr. Candiss Norse , who verbally acknowledged these results. IMPRESSION: 1. Epidural abscess from C2 to sacrum as described. Maximal cord compression from T3-4 to T5-6. The thecal sac is completely effaced from L1 to L4-5. At the L3-4 interspinous space the spinal abscess communicates with large bilateral abscesses within the intrinsic back muscles. There is osteomyelitis of the L2, L3, and L4 spinous processes. No discitis or facet arthritis. 2. Left paravertebral abscess along the lower thoracic spine with small left empyema that is new from CT 2 days ago. 3. Distended bladder Electronically Signed   By: Monte Fantasia M.D.   On: 03/22/2018 15:11   Mr Thoracic Spine W Wo Contrast  Result Date: 03/22/2018 CLINICAL DATA:  Spine infection. EXAM: MRI TOTAL SPINE WITHOUT AND WITH CONTRAST TECHNIQUE: Multisequence MR imaging of the spine from the cervical spine to the sacrum was performed prior to and following IV contrast administration. CONTRAST:  6 cc Gadavist intravenous COMPARISON:  CT of the thoracic and lumbar spine from 2 days ago FINDINGS: MRI CERVICAL SPINE FINDINGS Alignment: Normal Vertebrae: No evidence of osseous infection. Canal/Cord: There is extensive spinal fluid collection  preferentially in the ventral but also in the right more than left dorsal canal. Subarachnoid space is diffusely effaced. Maximal thickness is posterior to C2 at 9 mm. No cord signal abnormality. Posterior Fossa, vertebral arteries, paraspinal tissues: No retropharyngeal or other discrete soft tissue collection. Disc levels: C2-3: Unremarkable. C3-4: Small left foraminal protrusion with moderate narrowing C4-5: Unremarkable. C5-6: Disc narrowing and bulging with asymmetric left uncovertebral spurring. Left foraminal impingement C6-7: Right foraminal protrusion mild narrowing. C7-T1:Unremarkable. MRI THORACIC SPINE FINDINGS Alignment:  Normal Vertebrae: No evidence of osteomyelitis or discitis. T12 butterfly vertebra. Canal/Cord: Cervical ventral epidural collection continues throughout the thoracic levels. There is also a focal dorsal component at T3-4 to T5-6, where thecal sac effacement is accentuated and there is cord flattening. No cord edema. Paraspinal and other soft tissues: Paraspinous phlegmon on the left at T8-T12, with new complex left pleural effusion and lower lobe atelectasis Disc levels: T5-6 central disc protrusion. MRI LUMBAR SPINE FINDINGS Segmentation:  5 lumbar type vertebral bodies Alignment:  Grade 1 anterolisthesis at L5-S1. Vertebrae: Marrow edema and heterogeneous enhancement within the L2, L3, and L4 spinous processes. No discitis or facet edema. Conus medullaris: Extends to the L1 level and is non edematous. There is extensive epidural collection completely effacing the thecal sac throughout the lumbar spine until L4-5 and below where the collection becomes ventral and right eccentric. The infection communicates with extensive bilateral abscess within the intrinsic back muscles via the interspinous space at L3-4. Patient had recent subcutaneous collection and left buttocks collection drainage, with packing seen in place. This midline, upper subcutaneous collection communicates with the  paravertebral abscess along its superior margin based on postcontrast axial images. Paraspinal and other soft tissues: As above.  Distended bladder Disc levels: Chronic bilateral pars defects at L5. Critical Value/emergent results were called by telephone at the time of interpretation on 03/22/2018 at 3:04 pm to Dr. Candiss Norse , who verbally acknowledged these results. IMPRESSION: 1. Epidural abscess from C2 to sacrum as described. Maximal cord compression from T3-4 to T5-6. The thecal sac is completely effaced from L1 to L4-5. At the L3-4 interspinous space the spinal abscess communicates with large bilateral abscesses within the intrinsic back muscles. There is osteomyelitis of the L2, L3, and L4 spinous processes. No discitis or facet arthritis. 2. Left paravertebral abscess along  the lower thoracic spine with small left empyema that is new from CT 2 days ago. 3. Distended bladder Electronically Signed   By: Monte Fantasia M.D.   On: 03/22/2018 15:11   Mr Lumbar Spine W Wo Contrast  Result Date: 03/22/2018 CLINICAL DATA:  Spine infection. EXAM: MRI TOTAL SPINE WITHOUT AND WITH CONTRAST TECHNIQUE: Multisequence MR imaging of the spine from the cervical spine to the sacrum was performed prior to and following IV contrast administration. CONTRAST:  6 cc Gadavist intravenous COMPARISON:  CT of the thoracic and lumbar spine from 2 days ago FINDINGS: MRI CERVICAL SPINE FINDINGS Alignment: Normal Vertebrae: No evidence of osseous infection. Canal/Cord: There is extensive spinal fluid collection preferentially in the ventral but also in the right more than left dorsal canal. Subarachnoid space is diffusely effaced. Maximal thickness is posterior to C2 at 9 mm. No cord signal abnormality. Posterior Fossa, vertebral arteries, paraspinal tissues: No retropharyngeal or other discrete soft tissue collection. Disc levels: C2-3: Unremarkable. C3-4: Small left foraminal protrusion with moderate narrowing C4-5: Unremarkable. C5-6:  Disc narrowing and bulging with asymmetric left uncovertebral spurring. Left foraminal impingement C6-7: Right foraminal protrusion mild narrowing. C7-T1:Unremarkable. MRI THORACIC SPINE FINDINGS Alignment:  Normal Vertebrae: No evidence of osteomyelitis or discitis. T12 butterfly vertebra. Canal/Cord: Cervical ventral epidural collection continues throughout the thoracic levels. There is also a focal dorsal component at T3-4 to T5-6, where thecal sac effacement is accentuated and there is cord flattening. No cord edema. Paraspinal and other soft tissues: Paraspinous phlegmon on the left at T8-T12, with new complex left pleural effusion and lower lobe atelectasis Disc levels: T5-6 central disc protrusion. MRI LUMBAR SPINE FINDINGS Segmentation:  5 lumbar type vertebral bodies Alignment:  Grade 1 anterolisthesis at L5-S1. Vertebrae: Marrow edema and heterogeneous enhancement within the L2, L3, and L4 spinous processes. No discitis or facet edema. Conus medullaris: Extends to the L1 level and is non edematous. There is extensive epidural collection completely effacing the thecal sac throughout the lumbar spine until L4-5 and below where the collection becomes ventral and right eccentric. The infection communicates with extensive bilateral abscess within the intrinsic back muscles via the interspinous space at L3-4. Patient had recent subcutaneous collection and left buttocks collection drainage, with packing seen in place. This midline, upper subcutaneous collection communicates with the paravertebral abscess along its superior margin based on postcontrast axial images. Paraspinal and other soft tissues: As above.  Distended bladder Disc levels: Chronic bilateral pars defects at L5. Critical Value/emergent results were called by telephone at the time of interpretation on 03/22/2018 at 3:04 pm to Dr. Candiss Norse , who verbally acknowledged these results. IMPRESSION: 1. Epidural abscess from C2 to sacrum as described. Maximal  cord compression from T3-4 to T5-6. The thecal sac is completely effaced from L1 to L4-5. At the L3-4 interspinous space the spinal abscess communicates with large bilateral abscesses within the intrinsic back muscles. There is osteomyelitis of the L2, L3, and L4 spinous processes. No discitis or facet arthritis. 2. Left paravertebral abscess along the lower thoracic spine with small left empyema that is new from CT 2 days ago. 3. Distended bladder Electronically Signed   By: Monte Fantasia M.D.   On: 03/22/2018 15:11     Assessment/Plan: C2- sacral spinal abscess MSSA bacteremia TV IE IVDA DM Hep C (treated?)  Total days of antibiotics:4 (ancef)  Would rec 8 weeks of IV anbx if he can take- he will need placement He will need repeat MRI at end of course  prior to making decisions about po or further IV  No Known Allergies  OPAT Orders Discharge antibiotics: ancef 2g ivpb q8h  Duration: 56 days End Date: 05-25-18  [facility] Care Per Protocol:  Labs weekly while on IV antibiotics: _x_ CBC with differential __ BMP _x_ CMP _x_ CRP _x_ ESR __ Vancomycin trough  __ Please pull PIC at completion of IV antibiotics _x_ Please leave PIC in place until doctor has seen patient or been notified  Fax weekly labs to [phone]  Clinic Follow Up Appt: 8 weeks, ID          Bobby Rumpf MD, FACP Infectious Diseases (pager) [phone] [email or web address] 03/24/2018, 9:35 AM  LOS: 4 days

## 2018-03-24 NOTE — Progress Notes (Signed)
Patient has been lethargic for majority of shift. Kept falling asleep mid-conversation in the afternoon and had to be spoken to very loudly in order to get a response. Also incontinent of bladder x 3 this shift. VS stable. MD notified, D/C'ed suboxone and changed clonopin to PRN. Held afternoon doses of flexeril and gabapentin d/t lethargy.

## 2018-03-24 NOTE — Progress Notes (Signed)
[facility] Progress Note  3 Days Post-Op  Subjective: No new complaints.  Objective: Vital signs in last 24 hours: Temp:  [98.6 F (37 C)-99 F (37.2 C)] 98.9 F (37.2 C) (09/26 0826) Pulse Rate:  [98-110] 110 (09/26 0826) Resp:  [18-19] 18 (09/26 0826) BP: (106-133)/(69-86) 133/86 (09/26 0826) SpO2:  [96 %-98 %] 96 % (09/26 0826) Last BM Date: (PTA)  Intake/Output from previous day: 09/25 0701 - 09/26 0700 In: 10 [I.V.:10] Out: 1450 [Urine:1450] Intake/Output this shift: No intake/output data recorded.  PE: Back: superior wound has more granulation tissue at the base, but still with purulent drainage that wells up at the base.  Inferior wound still with 100% fibrin and purulent drainage     Lab Results:  Recent Labs    03/23/18 0439 03/24/18 0434  WBC 21.2* 16.7*  HGB 11.0* 11.4*  HCT 34.9* 35.7*  PLT 311 293   BMET Recent Labs    03/23/18 0439 03/24/18 0434  NA 138 140  K 3.1* 2.9*  CL 106 104  CO2 22 24  GLUCOSE 281* 174*  BUN 18 17  CREATININE 0.73 0.64  CALCIUM 9.0 9.3   PT/INR No results for input(s): LABPROT, INR in the last 72 hours. CMP     Component Value Date/Time   NA 140 03/24/2018 0434   NA 137 10/14/2017 1619   K 2.9 (L) 03/24/2018 0434   CL 104 03/24/2018 0434   CO2 24 03/24/2018 0434   GLUCOSE 174 (H) 03/24/2018 0434   BUN 17 03/24/2018 0434   BUN 9 10/14/2017 1619   CREATININE 0.64 03/24/2018 0434   CREATININE 0.77 12/11/2016 1045   CALCIUM 9.3 03/24/2018 0434   PROT 6.6 03/24/2018 0434   PROT 8.5 10/14/2017 1619   ALBUMIN 1.9 (L) 03/24/2018 0434   ALBUMIN 4.9 10/14/2017 1619   AST 17 03/24/2018 0434   ALT 9 03/24/2018 0434   ALT BLOOD 03/23/2018 0439   ALKPHOS 121 03/24/2018 0434   BILITOT 0.5 03/24/2018 0434   BILITOT 0.4 10/14/2017 1619   GFRNONAA >60 03/24/2018 0434   GFRNONAA >89 12/11/2016 1045   GFRAA >60 03/24/2018 0434   GFRAA >89 12/11/2016 1045   Lipase     Component Value Date/Time    LIPASE 15 03/20/2018 0759       Studies/Results: No results found.  Anti-infectives: Anti-infectives (From admission, onward)   Start     Dose/Rate Route Frequency Ordered Stop   03/21/18 0930  ceFAZolin (ANCEF) IVPB 2g/100 mL premix     2 g 200 mL/hr over 30 Minutes Intravenous Every 8 hours 03/21/18 0920         Assessment/Plan T2DM on insulin - A1c 02/26/18 12.6  HTN Hx of IVDA - positive for cocaine and opiates on UDS H/o hepatitis C - MCV Ab >11, HCV RNA negative  MSSA bacteremia- abx per primary team, ID Lumbar abscesses, POD 3, s/p I&D 9/23 Dr. Janee Morn - CX: staph aureus pan sensitive, anaerobic cx pending - Continue TID dressing changes and hydrotherapy. -conts to have copious purulent drainage, secondary to communication between spinal abscess at L3-4 interspinous space and the abscesses that were drained.  Epidural abscess from C2-sacrum -per medicine and NS Endocarditis -per medicine  FEN: CM diet VTE: SCDs/heparin ID: ancef 9/21>>   LOS: 5 days    Letha Cape , [facility] Surgery 03/25/2018, 1:39 PM Pager: [phone] Consults: [phone] Mon 7:00 am -11:30 AM Tues-Fri 7:00 am-4:30 pm Sat-Sun 7:00 am-11:30 am

## 2018-03-25 LAB — GLUCOSE, CAPILLARY
GLUCOSE-CAPILLARY: 291 mg/dL — AB (ref 70–99)
GLUCOSE-CAPILLARY: 182 mg/dL — AB (ref 70–99)
GLUCOSE-CAPILLARY: 240 mg/dL — AB (ref 70–99)
GLUCOSE-CAPILLARY: 178 mg/dL — AB (ref 70–99)

## 2018-03-25 MED ORDER — BISACODYL 5 MG PO TBEC
10.0000 mg | DELAYED_RELEASE_TABLET | Freq: Once | ORAL | Status: AC
  Administered 2018-03-25: 10 mg via ORAL
  Filled 2018-03-25: qty 2

## 2018-03-25 MED ORDER — SENNOSIDES-DOCUSATE SODIUM 8.6-50 MG PO TABS
1.0000 | ORAL_TABLET | Freq: Two times a day (BID) | ORAL | Status: DC
  Administered 2018-03-25 – 2018-03-26 (×2): 1 via ORAL
  Filled 2018-03-25 (×2): qty 1

## 2018-03-25 MED ORDER — CYCLOBENZAPRINE HCL 5 MG PO TABS
5.0000 mg | ORAL_TABLET | Freq: Three times a day (TID) | ORAL | Status: DC | PRN
  Administered 2018-03-28 – 2018-04-16 (×33): 5 mg via ORAL
  Filled 2018-03-25 (×36): qty 1

## 2018-03-25 MED ORDER — OXYCODONE-ACETAMINOPHEN 5-325 MG PO TABS
1.0000 | ORAL_TABLET | Freq: Four times a day (QID) | ORAL | Status: DC | PRN
  Administered 2018-03-25 – 2018-03-28 (×10): 2 via ORAL
  Filled 2018-03-25 (×10): qty 2

## 2018-03-25 MED ORDER — FLEET ENEMA 7-19 GM/118ML RE ENEM: 1.0000 | ENEMA | Freq: Every day | RECTAL | Status: DC | PRN

## 2018-03-25 MED ORDER — BISACODYL 10 MG RE SUPP
10.0000 mg | Freq: Every day | RECTAL | Status: DC | PRN
  Administered 2018-03-25: 10 mg via RECTAL
  Filled 2018-03-25: qty 1

## 2018-03-25 NOTE — Progress Notes (Signed)
Spoke with RN Helmut Muster and made her aware that the [facility] does not pull back blood, patient would need to be stuck by lab

## 2018-03-25 NOTE — Progress Notes (Signed)
PROGRESS NOTE        PATIENT DETAILS Name: Tony Long Age: 42 y.o. Sex: male Date of Birth: 05-Sep-1975 Admit Date: 03/20/2018 Admitting Physician Kendell Bane, MD RUE:[id number], Rolm Gala, FNP  Brief Narrative: [name] a 42 y.o. male history of IVDA, DM-2, hypertension admitted for worsening back pain-further evaluation revealed MSSA bacteremia with tricuspid valve endocarditis, epidural abscess involving C2 through sacrum, and numerous soft tissue back abscesses.  Evaluated by general surgery-underwent I&D of her lower back soft tissue abscess, evaluated by neurosurgery-not felt to be a candidate for decompressive surgery due to extensive nature of the disease and lack of any significant neurological findings.  ID following with plans to continue Ancef with stop date of 05/25/2018.  See below for further details  Subjective: No major events overnight-denies any chest pain or shortness of breath.  Assessment/Plan: MSSA bacteremia with tricuspid valve endocarditis, extensive epidural abscess (C2 through sacrum) and large soft tissue lower back abscess: Remains stable without any concerning neurological findings, ID neurosurgery following-recommendations are to continue with IV antimicrobial therapy with stop date of 11/26.  Due to lack of any concerning neurological findings-neurosurgery does not recommend decompressive surgery for this extensive epidural abscess.  [name] not a candidate for outpatient IV antimicrobial therapy due to active drug use-if unable to place at [name] will need to stay inpatient until 11/26.  Patient aware of the extensive nature of this infection with life-threatening and life disabling effects including quadriplegia if he is not adequately treated.  DM-2: Had episodes of hypoglycemia yesterday afternoon-CBGs relatively stable on Lantus 20 units nightly and SSI.  Follow for another day or 2 before adjusting any  further  Hypertension: BP controlled-continue clonidine and metoprolol.  We will continue to slowly taper of clonidine-decrease to 0.1 3 times daily.    Cocaine/Subutex abuse: Withdrawal symptoms much better-due to excessive sedation on 9/25-have changed Klonopin and Flexeril to as needed.  He still requires significant amount of narcotics for pain and dressing changes-hence have not yet started on Suboxone-when his pain issues are more better controlled-he would likely need initiation of Suboxone.  Polysubstance abuse/IVDA: Counseled  DVT Prophylaxis: Prophylactic heparin  Code Status: Full code  Family Communication: None/ at bedside  Disposition Plan: Remain inpatient-will be inpatient until 11/26-if SNF placement not possible.    Antimicrobial agents: Anti-infectives (From admission, onward)   Start     Dose/Rate Route Frequency Ordered Stop   03/21/18 0930  ceFAZolin (ANCEF) IVPB 2g/100 mL premix     2 g 200 mL/hr over 30 Minutes Intravenous Every 8 hours 03/21/18 0920        Procedures: 9/23>> TEE 9/22>> PICC line  CONSULTS:  ID, general surgery and Neurosurgery  Time spent: 25- minutes-Greater than 50% of this time was spent in counseling, explanation of diagnosis, planning of further management, and coordination of care.  MEDICATIONS: Scheduled Meds: . cloNIDine  0.1 mg Oral TID  . cyclobenzaprine  5 mg Oral TID  . gabapentin  300 mg Oral TID  . heparin injection (subcutaneous)  5,000 Units Subcutaneous Q8H  . insulin aspart  0-9 Units Subcutaneous TID WC  . insulin glargine  20 Units Subcutaneous QHS  . metoprolol tartrate  50 mg Oral BID  . nicotine  21 mg Transdermal Daily  . polyethylene glycol  17 g Oral BID  . sodium chloride flush  10-40 mL Intracatheter Q12H   Continuous Infusions: .  ceFAZolin (ANCEF) IV 2 g (03/25/18 0657)   PRN Meds:.clonazepam, hydrALAZINE, magnesium hydroxide, metoprolol tartrate, oxyCODONE-acetaminophen   PHYSICAL  EXAM: Vital signs: Vitals:   03/24/18 1617 03/24/18 2121 03/25/18 0540 03/25/18 0826  BP: 123/78 106/69 115/76 133/86  Pulse:  98 (!) 107 (!) 110  Resp:  19 18 18   Temp:  98.6 F (37 C) 99 F (37.2 C) 98.9 F (37.2 C)  TempSrc:  Oral Oral Oral  SpO2:  98% 96% 96%  Weight:      Height:       Filed Weights   03/21/18 0602  Weight: 68 kg   Body mass index is 20.92 kg/m.   General appearance:Awake, alert, not in any distress.  Eyes:no scleral icterus. HEENT: Atraumatic and Normocephalic Neck: supple, no JVD. Resp:Good air entry bilaterally,no rales or rhonchi CVS: S1 S2 regular, no murmurs.  GI: Bowel sounds present, Non tender and not distended with no gaurding, rigidity or rebound. Extremities: B/L Lower Ext shows no edema, both legs are warm to touch Neurology:  Non focal Psychiatric: Normal judgment and insight. Normal mood. Musculoskeletal:No digital cyanosis Skin:No Rash, warm and dry Wounds:N/A  I have personally reviewed following labs and imaging studies  LABORATORY DATA: CBC: Recent Labs  Lab 03/20/18 0759 03/21/18 1337 03/22/18 0339 03/23/18 0439 03/24/18 0434  WBC 22.2* 26.3* 25.3* 21.2* 16.7*  NEUTROABS 19.7*  --   --   --   --   HGB 10.5* 10.3* 11.2* 11.0* 11.4*  HCT 32.1* 34.8* 35.3* 34.9* 35.7*  MCV 95.6 103.6* 97.0 96.4 97.3  PLT 388 374 386 311 293    Basic Metabolic Panel: Recent Labs  Lab 03/20/18 0759  03/21/18 1338 03/21/18 2228 03/22/18 0339 03/23/18 0439 03/24/18 0434  NA 138   < > 136 136 141 138 140  K 3.7   < > 3.4* 2.8* 2.9* 3.1* 2.9*  CL 92*   < > 102 106 107 106 104  CO2 18*   < > 9* 18* 21* 22 24  GLUCOSE 368*   < > 348* 143* 127* 281* 174*  BUN 11   < > 14 11 9 18 17   CREATININE 0.97   < > 1.53* 0.93 0.81 0.73 0.64  CALCIUM 9.2   < > 9.3 9.3 9.4 9.0 9.3  MG 1.8  --  1.6*  --  1.7 1.8 1.9   < > = values in this interval not displayed.    GFR: Estimated Creatinine Clearance: 116.9 mL/min (by C-G formula based on  SCr of 0.64 mg/dL).  Liver Function Tests: Recent Labs  Lab 03/20/18 0759 03/22/18 0339 03/23/18 0439 03/24/18 0434  AST 13* 9* 9* 17  ALT 8 8 7   BLOOD 9  ALKPHOS 165* 154* 126 121  BILITOT 2.3* 0.8 0.5 0.5  PROT 7.5 7.0 6.4* 6.6  ALBUMIN 2.3* 2.0* 1.8* 1.9*   Recent Labs  Lab 03/20/18 0759  LIPASE 15   No results for input(s): AMMONIA in the last 168 hours.  Coagulation Profile: Recent Labs  Lab 03/20/18 0759 03/21/18 1337  INR 1.18 1.40    Cardiac Enzymes: Recent Labs  Lab 03/20/18 0759  CKTOTAL 19*    BNP (last 3 results) No results for input(s): PROBNP in the last 8760 hours.  HbA1C: No results for input(s): HGBA1C in the last 72 hours.  CBG: Recent Labs  Lab 03/24/18 1320 03/24/18 1353 03/24/18 1755 03/24/18 2118 03/25/18 0810  GLUCAP  40* 135* 94 100* 291*    Lipid Profile: No results for input(s): CHOL, HDL, LDLCALC, TRIG, CHOLHDL, LDLDIRECT in the last 72 hours.  Thyroid Function Tests: No results for input(s): TSH, T4TOTAL, FREET4, T3FREE, THYROIDAB in the last 72 hours.  Anemia Panel: No results for input(s): VITAMINB12, FOLATE, FERRITIN, TIBC, IRON, RETICCTPCT in the last 72 hours.  Urine analysis:    Component Value Date/Time   COLORURINE YELLOW (A) 03/20/2018 1622   APPEARANCEUR CLEAR (A) 03/20/2018 1622   LABSPEC 1.015 03/20/2018 1622   PHURINE 5.0 03/20/2018 1622   GLUCOSEU >500 (A) 03/20/2018 1622   HGBUR SMALL (A) 03/20/2018 1622   BILIRUBINUR NEGATIVE 03/20/2018 1622   BILIRUBINUR small 02/26/2018 1202   KETONESUR 80 (A) 03/20/2018 1622   PROTEINUR NEGATIVE 03/20/2018 1622   UROBILINOGEN 1.0 02/26/2018 1202   UROBILINOGEN 1.0 12/11/2016 1020   NITRITE NEGATIVE 03/20/2018 1622   LEUKOCYTESUR NEGATIVE 03/20/2018 1622    Sepsis Labs: Lactic Acid, Venous    Component Value Date/Time   LATICACIDVEN 1.4 03/20/2018 1235    MICROBIOLOGY: Recent Results (from the past 240 hour(s))  Blood Culture (routine x 2)      Status: Abnormal   Collection Time: 03/20/18  7:59 AM  Result Value Ref Range Status   Specimen Description   Final    BLOOD EJ Performed at [facility], [address], [place], [place] [address]    Special Requests   Final    BOTTLES DRAWN AEROBIC AND ANAEROBIC Blood Culture adequate volume Performed at [facility], [address] [place], [place] [address]    Culture  Setup Time   Final    GRAM POSITIVE COCCI IN BOTH AEROBIC AND ANAEROBIC BOTTLES CRITICAL RESULT CALLED TO, READ BACK BY AND VERIFIED WITH: MATT MCBANE ON 03/21/18 AT 0001 QSD Performed at [facility], [address] [place], [place] [address]    Culture (A)  Final    STAPHYLOCOCCUS AUREUS SUSCEPTIBILITIES PERFORMED ON PREVIOUS CULTURE WITHIN THE LAST 5 DAYS. Performed at [facility], [address] [place], [place] [address]    Report Status 03/23/2018 FINAL  Final  Blood Culture (routine x 2)     Status: Abnormal   Collection Time: 03/20/18  7:59 AM  Result Value Ref Range Status   Specimen Description   Final    BLOOD EJ Performed at [facility], [address] [place], [place] [address]    Special Requests   Final    BOTTLES DRAWN AEROBIC AND ANAEROBIC Blood Culture adequate volume Performed at [facility], [address] [place], [place] [address]    Culture  Setup Time   Final    GRAM POSITIVE COCCI IN BOTH AEROBIC AND ANAEROBIC BOTTLES CRITICAL RESULT CALLED TO, READ BACK BY AND VERIFIED WITH: MATT MCBANE ON 03/21/18 AT 0001 QSD Performed at [facility], [address] [place], [place] [address]    Culture STAPHYLOCOCCUS AUREUS (A)  Final   Report Status 03/23/2018 FINAL  Final   Organism ID, Bacteria STAPHYLOCOCCUS AUREUS  Final      Susceptibility   Staphylococcus aureus - MIC*    CIPROFLOXACIN <=0.5 SENSITIVE Sensitive     ERYTHROMYCIN <=0.25 SENSITIVE Sensitive     GENTAMICIN <=0.5 SENSITIVE Sensitive     OXACILLIN  <=0.25 SENSITIVE Sensitive     TETRACYCLINE <=1 SENSITIVE Sensitive     VANCOMYCIN <=0.5 SENSITIVE Sensitive     TRIMETH/SULFA <=10 SENSITIVE Sensitive  CLINDAMYCIN <=0.25 SENSITIVE Sensitive     RIFAMPIN <=0.5 SENSITIVE Sensitive     Inducible Clindamycin NEGATIVE Sensitive     * STAPHYLOCOCCUS AUREUS  Blood Culture ID Panel (Reflexed)     Status: Abnormal   Collection Time: 03/20/18  7:59 AM  Result Value Ref Range Status   Enterococcus species NOT DETECTED NOT DETECTED Final   Listeria monocytogenes NOT DETECTED NOT DETECTED Final   Staphylococcus species DETECTED (A) NOT DETECTED Final    Comment: CRITICAL RESULT CALLED TO, READ BACK BY AND VERIFIED WITH: MATT MCBANE ON 03/21/18 AT 0001 QSD    Staphylococcus aureus DETECTED (A) NOT DETECTED Final    Comment: Methicillin (oxacillin) susceptible Staphylococcus aureus (MSSA). Preferred therapy is anti staphylococcal beta lactam antibiotic (Cefazolin or Nafcillin), unless clinically contraindicated. CRITICAL RESULT CALLED TO, READ BACK BY AND VERIFIED WITH: MATT MCBANE ON 03/21/18 AT 0001 QSD    Methicillin resistance NOT DETECTED NOT DETECTED Final   Streptococcus species NOT DETECTED NOT DETECTED Final   Streptococcus agalactiae NOT DETECTED NOT DETECTED Final   Streptococcus pneumoniae NOT DETECTED NOT DETECTED Final   Streptococcus pyogenes NOT DETECTED NOT DETECTED Final   Acinetobacter baumannii NOT DETECTED NOT DETECTED Final   Enterobacteriaceae species NOT DETECTED NOT DETECTED Final   Enterobacter cloacae complex NOT DETECTED NOT DETECTED Final   Escherichia coli NOT DETECTED NOT DETECTED Final   Klebsiella oxytoca NOT DETECTED NOT DETECTED Final   Klebsiella pneumoniae NOT DETECTED NOT DETECTED Final   Proteus species NOT DETECTED NOT DETECTED Final   Serratia marcescens NOT DETECTED NOT DETECTED Final   Haemophilus influenzae NOT DETECTED NOT DETECTED Final   Neisseria meningitidis NOT DETECTED NOT DETECTED Final    Pseudomonas aeruginosa NOT DETECTED NOT DETECTED Final   Candida albicans NOT DETECTED NOT DETECTED Final   Candida glabrata NOT DETECTED NOT DETECTED Final   Candida krusei NOT DETECTED NOT DETECTED Final   Candida parapsilosis NOT DETECTED NOT DETECTED Final   Candida tropicalis NOT DETECTED NOT DETECTED Final    Comment: Performed at [facility], [address] [place], [place] [address]  MRSA PCR Screening     Status: None   Collection Time: 03/20/18  3:50 PM  Result Value Ref Range Status   MRSA by PCR NEGATIVE NEGATIVE Final    Comment:        The GeneXpert MRSA Assay (FDA approved for NASAL specimens only), is one component of a comprehensive MRSA colonization surveillance program. It is not intended to diagnose MRSA infection nor to guide or monitor treatment for MRSA infections. Performed at [facility], [address], [place], [place] [address]   Anaerobic culture     Status: None (Preliminary result)   Collection Time: 03/22/18 10:36 AM  Result Value Ref Range Status   Specimen Description ABSCESS BACK  Final   Special Requests NONE  Final   Gram Stain   Final    RARE WBC PRESENT, PREDOMINANTLY MONONUCLEAR ABUNDANT GRAM POSITIVE COCCI Performed at [name], [address], [place], [place] [address]    Culture   Final    NO ANAEROBES ISOLATED; CULTURE IN PROGRESS FOR 5 DAYS   Report Status PENDING  Incomplete  Aerobic Culture (superficial specimen)     Status: None   Collection Time: 03/22/18 10:36 AM  Result Value Ref Range Status   Specimen Description ABSCESS BACK  Final   Special Requests NONE  Final   Gram Stain   Final  FEW WBC PRESENT,BOTH PMN AND MONONUCLEAR MODERATE GRAM POSITIVE COCCI Performed at [facility], [address] [place], [place] [address]    Culture ABUNDANT STAPHYLOCOCCUS AUREUS  Final   Report Status 03/24/2018 FINAL  Final   Organism ID, Bacteria STAPHYLOCOCCUS AUREUS  Final       Susceptibility   Staphylococcus aureus - MIC*    CIPROFLOXACIN <=0.5 SENSITIVE Sensitive     ERYTHROMYCIN <=0.25 SENSITIVE Sensitive     GENTAMICIN <=0.5 SENSITIVE Sensitive     OXACILLIN 0.5 SENSITIVE Sensitive     TETRACYCLINE <=1 SENSITIVE Sensitive     VANCOMYCIN 1 SENSITIVE Sensitive     TRIMETH/SULFA <=10 SENSITIVE Sensitive     CLINDAMYCIN <=0.25 SENSITIVE Sensitive     RIFAMPIN <=0.5 SENSITIVE Sensitive     Inducible Clindamycin NEGATIVE Sensitive     * ABUNDANT STAPHYLOCOCCUS AUREUS  Culture, blood (Routine X 2) w Reflex to ID Panel     Status: None (Preliminary result)   Collection Time: 03/23/18  4:39 AM  Result Value Ref Range Status   Specimen Description BLOOD RIGHT HAND  Final   Special Requests   Final    BOTTLES DRAWN AEROBIC ONLY Blood Culture adequate volume   Culture   Final    NO GROWTH 2 DAYS Performed at [facility], [address], [place], [place] [address]    Report Status PENDING  Incomplete    RADIOLOGY STUDIES/RESULTS: Ct Thoracic Spine Wo Contrast  Result Date: 03/20/2018 CLINICAL DATA:  Progressive back pain.  Swelling of the lower back. EXAM: CT THORACIC SPINE WITHOUT CONTRAST TECHNIQUE: Multidetector CT images of the thoracic were obtained using the standard protocol without intravenous contrast. COMPARISON:  None. FINDINGS: Alignment: Normal. 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 Paraspinal and other soft tissues: There is abnormal gas and fluid in the soft tissues of the right side of the base of the neck and in the right supraclavicular region. Paraspinal soft tissues appear normal throughout the thoracic spine. Disc levels: There is no evidence of disc protrusion or significant disc bulging or spinal or foraminal stenosis or other significant abnormality of the thoracic spine. IMPRESSION: 1. Evidence of cellulitis involving the right side of the base of the neck extending into the right supraclavicular region with fluid and gas in  the soft tissues at the base of the right side of the neck. 2. No significant abnormality of the thoracic spine. 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 Electronically Signed   By: Francene Boyers M.D.   On: 03/20/2018 11:25   Ct Lumbar Spine Wo Contrast  Addendum Date: 03/20/2018   ADDENDUM REPORT: 03/20/2018 11:44 ADDENDUM: Critical Value/emergent results were called by telephone at the time of interpretation on 03/20/2018 at 11:30 am to Dr. Sharyn Creamer , who verbally acknowledged these results. Electronically Signed   By: Francene Boyers M.D.   On: 03/20/2018 11:44   Result Date: 03/20/2018 CLINICAL DATA:  Increasing low back pain and soft tissue swelling. EXAM: CT LUMBAR SPINE WITHOUT CONTRAST TECHNIQUE: Multidetector CT imaging of the lumbar spine was performed without intravenous contrast administration. Multiplanar CT image reconstructions were also generated. IV contrast could not be utilized due to the lack of an appropriate IV. COMPARISON:  None. FINDINGS: Segmentation: 5 lumbar type vertebrae. Alignment: Normal. Vertebrae: There is a moth-eaten appearance of the spinous processes of L3 and L4 which is worrisome for osteomyelitis. Bilateral pars defects at L5 with grade 1 spondylolisthesis. 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 Paraspinal and other soft  tissues: There is an extensive abnormal fluid collection in the subcutaneous soft tissues of the posterior aspect of the back extending from approximately L1-2 to S3. This fluid collection is lobulated and measures approximately 20 x 9 x 2.5 cm. It is centered slightly to the left of midline and has a mass effect upon the adjacent posterior paraspinal muscles. There is abnormal lucency in the underlying paraspinal muscles which could represent myositis. Disc levels: T11-12: No significant abnormality. Butterfly T12 vertebra. T12-L1: No significant abnormality. L1-2: Normal disc. Abnormal edema in the posterior paraspinal musculature with adjacent fluid  collection in the subcutaneous fat of the posterior aspect of the back as described above. L2-3: Normal disc. L3-4: Normal disc. Lucency in the posterior paraspinal soft tissues extends to the posterior aspect of the thecal sac on image 80 of series 4 but there is no discrete epidural abscess. L4-5: Normal disc.  No evidence of epidural abscess. L5-S1: Grade 1 spondylolisthesis. No disc bulging or protrusion. Bilateral pars defects. No visible epidural abscess. IMPRESSION: 1. Extensive abnormal fluid collection in the subcutaneous fat of the midline of the back with underlying marked abnormality of the posterior paraspinal musculature from L1-2 through S3. This is worrisome for subcutaneous abscess and myositis. 2. Moth-eaten appearance of the spinous processes of L3 and L4 consistent with osteomyelitis. 3. No discrete epidural abscess. However, the abnormal edema in the paraspinal musculature extends to the posterior aspect of the spinal canal at L3-4. 4. MRI with and without contrast may better define the extent of the soft tissue and infection and could detect epidural extension that is not apparent on this unenhanced CT scan. Electronically Signed: By: Francene Boyers M.D. On: 03/20/2018 11:18   Mr Cervical Spine W Wo Contrast  Result Date: 03/22/2018 CLINICAL DATA:  Spine infection. EXAM: MRI TOTAL SPINE WITHOUT AND WITH CONTRAST TECHNIQUE: Multisequence MR imaging of the spine from the cervical spine to the sacrum was performed prior to and following IV contrast administration. CONTRAST:  6 cc Gadavist intravenous COMPARISON:  CT of the thoracic and lumbar spine from 2 days ago FINDINGS: MRI CERVICAL SPINE FINDINGS Alignment: Normal Vertebrae: No evidence of osseous infection. Canal/Cord: There is extensive spinal fluid collection preferentially in the ventral but also in the right more than left dorsal canal. Subarachnoid space is diffusely effaced. Maximal thickness is posterior to C2 at 9 mm. No cord  signal abnormality. Posterior Fossa, vertebral arteries, paraspinal tissues: No retropharyngeal or other discrete soft tissue collection. Disc levels: C2-3: Unremarkable. C3-4: Small left foraminal protrusion with moderate narrowing C4-5: Unremarkable. C5-6: Disc narrowing and bulging with asymmetric left uncovertebral spurring. Left foraminal impingement C6-7: Right foraminal protrusion mild narrowing. C7-T1:Unremarkable. MRI THORACIC SPINE FINDINGS Alignment:  Normal Vertebrae: No evidence of osteomyelitis or discitis. T12 butterfly vertebra. Canal/Cord: Cervical ventral epidural collection continues throughout the thoracic levels. There is also a focal dorsal component at T3-4 to T5-6, where thecal sac effacement is accentuated and there is cord flattening. No cord edema. Paraspinal and other soft tissues: Paraspinous phlegmon on the left at T8-T12, with new complex left pleural effusion and lower lobe atelectasis Disc levels: T5-6 central disc protrusion. MRI LUMBAR SPINE FINDINGS Segmentation:  5 lumbar type vertebral bodies Alignment:  Grade 1 anterolisthesis at L5-S1. Vertebrae: Marrow edema and heterogeneous enhancement within the L2, L3, and L4 spinous processes. No discitis or facet edema. Conus medullaris: Extends to the L1 level and is non edematous. There is extensive epidural collection completely effacing the thecal sac throughout the lumbar  spine until L4-5 and below where the collection becomes ventral and right eccentric. The infection communicates with extensive bilateral abscess within the intrinsic back muscles via the interspinous space at L3-4. Patient had recent subcutaneous collection and left buttocks collection drainage, with packing seen in place. This midline, upper subcutaneous collection communicates with the paravertebral abscess along its superior margin based on postcontrast axial images. Paraspinal and other soft tissues: As above.  Distended bladder Disc levels: Chronic bilateral  pars defects at L5. Critical Value/emergent results were called by telephone at the time of interpretation on 03/22/2018 at 3:04 pm to Dr. Thedore Mins , who verbally acknowledged these results. IMPRESSION: 1. Epidural abscess from C2 to sacrum as described. Maximal cord compression from T3-4 to T5-6. The thecal sac is completely effaced from L1 to L4-5. At the L3-4 interspinous space the spinal abscess communicates with large bilateral abscesses within the intrinsic back muscles. There is osteomyelitis of the L2, L3, and L4 spinous processes. No discitis or facet arthritis. 2. Left paravertebral abscess along the lower thoracic spine with small left empyema that is new from CT 2 days ago. 3. Distended bladder Electronically Signed   By: Marnee Spring M.D.   On: 03/22/2018 15:11   Mr Thoracic Spine W Wo Contrast  Result Date: 03/22/2018 CLINICAL DATA:  Spine infection. EXAM: MRI TOTAL SPINE WITHOUT AND WITH CONTRAST TECHNIQUE: Multisequence MR imaging of the spine from the cervical spine to the sacrum was performed prior to and following IV contrast administration. CONTRAST:  6 cc Gadavist intravenous COMPARISON:  CT of the thoracic and lumbar spine from 2 days ago FINDINGS: MRI CERVICAL SPINE FINDINGS Alignment: Normal Vertebrae: No evidence of osseous infection. Canal/Cord: There is extensive spinal fluid collection preferentially in the ventral but also in the right more than left dorsal canal. Subarachnoid space is diffusely effaced. Maximal thickness is posterior to C2 at 9 mm. No cord signal abnormality. Posterior Fossa, vertebral arteries, paraspinal tissues: No retropharyngeal or other discrete soft tissue collection. Disc levels: C2-3: Unremarkable. C3-4: Small left foraminal protrusion with moderate narrowing C4-5: Unremarkable. C5-6: Disc narrowing and bulging with asymmetric left uncovertebral spurring. Left foraminal impingement C6-7: Right foraminal protrusion mild narrowing. C7-T1:Unremarkable. MRI  THORACIC SPINE FINDINGS Alignment:  Normal Vertebrae: No evidence of osteomyelitis or discitis. T12 butterfly vertebra. Canal/Cord: Cervical ventral epidural collection continues throughout the thoracic levels. There is also a focal dorsal component at T3-4 to T5-6, where thecal sac effacement is accentuated and there is cord flattening. No cord edema. Paraspinal and other soft tissues: Paraspinous phlegmon on the left at T8-T12, with new complex left pleural effusion and lower lobe atelectasis Disc levels: T5-6 central disc protrusion. MRI LUMBAR SPINE FINDINGS Segmentation:  5 lumbar type vertebral bodies Alignment:  Grade 1 anterolisthesis at L5-S1. Vertebrae: Marrow edema and heterogeneous enhancement within the L2, L3, and L4 spinous processes. No discitis or facet edema. Conus medullaris: Extends to the L1 level and is non edematous. There is extensive epidural collection completely effacing the thecal sac throughout the lumbar spine until L4-5 and below where the collection becomes ventral and right eccentric. The infection communicates with extensive bilateral abscess within the intrinsic back muscles via the interspinous space at L3-4. Patient had recent subcutaneous collection and left buttocks collection drainage, with packing seen in place. This midline, upper subcutaneous collection communicates with the paravertebral abscess along its superior margin based on postcontrast axial images. Paraspinal and other soft tissues: As above.  Distended bladder Disc levels: Chronic bilateral pars defects at  L5. Critical Value/emergent results were called by telephone at the time of interpretation on 03/22/2018 at 3:04 pm to Dr. Thedore Mins , who verbally acknowledged these results. IMPRESSION: 1. Epidural abscess from C2 to sacrum as described. Maximal cord compression from T3-4 to T5-6. The thecal sac is completely effaced from L1 to L4-5. At the L3-4 interspinous space the spinal abscess communicates with large  bilateral abscesses within the intrinsic back muscles. There is osteomyelitis of the L2, L3, and L4 spinous processes. No discitis or facet arthritis. 2. Left paravertebral abscess along the lower thoracic spine with small left empyema that is new from CT 2 days ago. 3. Distended bladder Electronically Signed   By: Marnee Spring M.D.   On: 03/22/2018 15:11   Mr Lumbar Spine W Wo Contrast  Result Date: 03/22/2018 CLINICAL DATA:  Spine infection. EXAM: MRI TOTAL SPINE WITHOUT AND WITH CONTRAST TECHNIQUE: Multisequence MR imaging of the spine from the cervical spine to the sacrum was performed prior to and following IV contrast administration. CONTRAST:  6 cc Gadavist intravenous COMPARISON:  CT of the thoracic and lumbar spine from 2 days ago FINDINGS: MRI CERVICAL SPINE FINDINGS Alignment: Normal Vertebrae: No evidence of osseous infection. Canal/Cord: There is extensive spinal fluid collection preferentially in the ventral but also in the right more than left dorsal canal. Subarachnoid space is diffusely effaced. Maximal thickness is posterior to C2 at 9 mm. No cord signal abnormality. Posterior Fossa, vertebral arteries, paraspinal tissues: No retropharyngeal or other discrete soft tissue collection. Disc levels: C2-3: Unremarkable. C3-4: Small left foraminal protrusion with moderate narrowing C4-5: Unremarkable. C5-6: Disc narrowing and bulging with asymmetric left uncovertebral spurring. Left foraminal impingement C6-7: Right foraminal protrusion mild narrowing. C7-T1:Unremarkable. MRI THORACIC SPINE FINDINGS Alignment:  Normal Vertebrae: No evidence of osteomyelitis or discitis. T12 butterfly vertebra. Canal/Cord: Cervical ventral epidural collection continues throughout the thoracic levels. There is also a focal dorsal component at T3-4 to T5-6, where thecal sac effacement is accentuated and there is cord flattening. No cord edema. Paraspinal and other soft tissues: Paraspinous phlegmon on the left at  T8-T12, with new complex left pleural effusion and lower lobe atelectasis Disc levels: T5-6 central disc protrusion. MRI LUMBAR SPINE FINDINGS Segmentation:  5 lumbar type vertebral bodies Alignment:  Grade 1 anterolisthesis at L5-S1. Vertebrae: Marrow edema and heterogeneous enhancement within the L2, L3, and L4 spinous processes. No discitis or facet edema. Conus medullaris: Extends to the L1 level and is non edematous. There is extensive epidural collection completely effacing the thecal sac throughout the lumbar spine until L4-5 and below where the collection becomes ventral and right eccentric. The infection communicates with extensive bilateral abscess within the intrinsic back muscles via the interspinous space at L3-4. Patient had recent subcutaneous collection and left buttocks collection drainage, with packing seen in place. This midline, upper subcutaneous collection communicates with the paravertebral abscess along its superior margin based on postcontrast axial images. Paraspinal and other soft tissues: As above.  Distended bladder Disc levels: Chronic bilateral pars defects at L5. Critical Value/emergent results were called by telephone at the time of interpretation on 03/22/2018 at 3:04 pm to Dr. Thedore Mins , who verbally acknowledged these results. IMPRESSION: 1. Epidural abscess from C2 to sacrum as described. Maximal cord compression from T3-4 to T5-6. The thecal sac is completely effaced from L1 to L4-5. At the L3-4 interspinous space the spinal abscess communicates with large bilateral abscesses within the intrinsic back muscles. There is osteomyelitis of the L2, L3, and L4 spinous  processes. No discitis or facet arthritis. 2. Left paravertebral abscess along the lower thoracic spine with small left empyema that is new from CT 2 days ago. 3. Distended bladder Electronically Signed   By: Marnee Spring M.D.   On: 03/22/2018 15:11   [place] Ekg Site Rite  Result Date: 03/21/2018 If Site Rite image not  attached, placement could not be confirmed due to current cardiac rhythm.    LOS: 5 days   Jeoffrey Massed, MD  [facility]  If 7PM-7AM, please contact night-coverage  Please page via [email or web address]-click on MD name and type text message  03/25/2018, 12:16 PM

## 2018-03-25 NOTE — Progress Notes (Addendum)
Subjective: Patient reports "I feel better today... not hurting right now"  Objective: Vital signs in last 24 hours: Temp:  [97.7 F (36.5 C)-99 F (37.2 C)] 99 F (37.2 C) (09/26 0540) Pulse Rate:  [98-107] 107 (09/26 0540) Resp:  [18-20] 18 (09/26 0540) BP: (106-123)/(69-78) 115/76 (09/26 0540) SpO2:  [95 %-98 %] 96 % (09/26 0540)  Intake/Output from previous day: 09/25 0701 - 09/26 0700 In: 10 [I.V.:10] Out: 1450 [Urine:1450] Intake/Output this shift: No intake/output data recorded.  Alert, conversant. Reports no pain at present, noting occasional lumbar pain. Neuro exam unchanged: good strength all extremities. No numbness or tingling.   Lab Results: Recent Labs    03/23/18 0439 03/24/18 0434  WBC 21.2* 16.7*  HGB 11.0* 11.4*  HCT 34.9* 35.7*  PLT 311 293   BMET Recent Labs    03/23/18 0439 03/24/18 0434  NA 138 140  K 3.1* 2.9*  CL 106 104  CO2 22 24  GLUCOSE 281* 174*  BUN 18 17  CREATININE 0.73 0.64  CALCIUM 9.0 9.3    Studies/Results: No results found.  Assessment/Plan:   LOS: 5 days  Supportive care continues   Georgiann Cocker 03/25/2018, 7:31 AM  [name] continuing to drain pus from his abscesses.  No neurologic deterioration.  He is feeling better.

## 2018-03-25 NOTE — Progress Notes (Addendum)
Inpatient Diabetes Program Recommendations  AACE/ADA: New Consensus Statement on Inpatient Glycemic Control (2015)  Target Ranges:  Prepandial:   less than 140 mg/dL      Peak postprandial:   less than 180 mg/dL (1-2 hours)      Critically ill patients:  140 - 180 mg/dL   Lab Results  Component Value Date   GLUCAP 291 (H) 03/25/2018   HGBA1C 12.6 (A) 02/26/2018    Review of Glycemic ControlResults for HIROSHI, KRUMMEL (MRN [id number]) as of 03/25/2018 11:17  Ref. Range 03/24/2018 07:34 03/24/2018 12:49 03/24/2018 13:20 03/24/2018 13:53 03/24/2018 17:55 03/24/2018 21:18 03/25/2018 08:10  Glucose-Capillary Latest Ref Range: 70 - 99 mg/dL 782 (H) 42 (LL) 40 (LL) 135 (H) 94 100 (H) 291 (H)   Diabetes history:DM Outpatient Diabetes medications: Lantus 40 units q HS, Humalog 10 units tid with meals (per patient he was supposed to be taking 20 units tid with meals), Metformin 500 mg bid Current orders for Inpatient glycemic control: Novolog sensitive tid with meals, Lantus 20 units q HS Inpatient Diabetes Program Recommendations:   Note that patient did not receive Lantus on 03/24/18 due to hypoglycemia. Lantus reduced to 20 units q HS.  Agree with orders.  Will follow. Thanks,  Beryl Meager, RN, BC-ADM Inpatient Diabetes Coordinator Pager [phone] (8a-5p)

## 2018-03-25 NOTE — Progress Notes (Signed)
Physical Therapy Wound Treatment Patient Details  Name: Tony Long MRN: [id number] Date of Birth: Mar 18, 1976  Today's Date: 03/25/2018 Time: 1140-1230 Time Calculation (min): 50 min  Subjective  Subjective: Pleasant and agreeable to hydrotherapy Patient and Family Stated Goals: Heal wound Prior Treatments: 03/22/18 I&D lumbar wounds  Pain Score: Pt tolerated treatment well overall however more awake today and restless with pain at times.  Wound Assessment  Wound / Incision (Open or Dehisced) 03/23/18 Incision - Open Lumbar Upper (Active)  Dressing Type ABD;Gauze (Comment);Moist to dry;Barrier Film (skin prep) 03/25/2018  2:31 PM  Dressing Changed Changed 03/25/2018  2:31 PM  Dressing Status Clean;Dry;Intact 03/25/2018  2:31 PM  Dressing Change Frequency Daily 03/25/2018  2:31 PM  Site / Wound Assessment Yellow;Pink;Pale 03/25/2018  2:31 PM  % Wound base Red or Granulating 60% 03/25/2018  2:31 PM  % Wound base Yellow/Fibrinous Exudate 40% 03/25/2018  2:31 PM  % Wound base Black/Eschar 0% 03/25/2018  2:31 PM  % Wound base Other/Granulation Tissue (Comment) 0% 03/25/2018  2:31 PM  Peri-wound Assessment Intact 03/25/2018  2:31 PM  Wound Length (cm) 3 cm 03/23/2018  1:00 PM  Wound Width (cm) 1.7 cm 03/23/2018  1:00 PM  Wound Depth (cm) 1.4 cm 03/23/2018  1:00 PM  Wound Volume (cm^3) 7.14 cm^3 03/23/2018  1:00 PM  Wound Surface Area (cm^2) 5.1 cm^2 03/23/2018  1:00 PM  Undermining (cm) 3.5cm 11-12 o'clock; 4.1cm 12-1 o'clock; 2.6cm 3'oclock;2.5cm 5 o'clock; 2.9cm 6 o'clock; 2.5cm 7 o'clock 03/23/2018  1:00 PM  Margins Unattached edges (unapproximated) 03/25/2018  2:31 PM  Closure None 03/25/2018  2:31 PM  Drainage Amount Moderate 03/25/2018  2:31 PM  Drainage Description Purulent 03/25/2018  2:31 PM  Treatment Debridement (Selective);Hydrotherapy (Pulse lavage);Packing (Saline gauze) 03/25/2018  2:31 PM     Wound / Incision (Open or Dehisced) 03/23/18 Incision - Open Lumbar Lower (Active)  Dressing  Type ABD;Barrier Film (skin prep);Gauze (Comment);Moist to dry 03/25/2018  2:31 PM  Dressing Changed Changed 03/25/2018  2:31 PM  Dressing Status Clean;Dry;Intact 03/25/2018  2:31 PM  Dressing Change Frequency Daily 03/25/2018  2:31 PM  Site / Wound Assessment Pink;Yellow 03/25/2018  2:31 PM  % Wound base Red or Granulating 30% 03/25/2018  2:31 PM  % Wound base Yellow/Fibrinous Exudate 70% 03/25/2018  2:31 PM  % Wound base Black/Eschar 0% 03/25/2018  2:31 PM  % Wound base Other/Granulation Tissue (Comment) 0% 03/25/2018  2:31 PM  Peri-wound Assessment Intact 03/25/2018  2:31 PM  Wound Length (cm) 3.3 cm 03/23/2018  1:00 PM  Wound Width (cm) 1.8 cm 03/23/2018  1:00 PM  Wound Depth (cm) 1.5 cm 03/23/2018  1:00 PM  Wound Volume (cm^3) 8.91 cm^3 03/23/2018  1:00 PM  Wound Surface Area (cm^2) 5.94 cm^2 03/23/2018  1:00 PM  Undermining (cm) 2.8cm 12 o'clock; 3.0cm 3 o'clock; 2.2cm 3-6 o'clock;  4.5cm 9-10 o'clock; 4.8cm 11 o'clock 03/23/2018  1:00 PM  Margins Unattached edges (unapproximated) 03/25/2018  2:31 PM  Closure None 03/25/2018  2:31 PM  Drainage Amount Moderate 03/25/2018  2:31 PM  Drainage Description Purulent 03/25/2018  2:31 PM  Treatment Debridement (Selective);Hydrotherapy (Pulse lavage);Packing (Saline gauze) 03/25/2018  2:31 PM   Hydrotherapy Pulsed lavage therapy - wound location: x2 lumbar wounds Pulsed Lavage with Suction (psi): 12 psi Pulsed Lavage with Suction - Normal Saline Used: 2000 mL Pulsed Lavage Tip: Tip with splash shield Selective Debridement Selective Debridement - Location: x2 lumbar wounds Selective Debridement - Tools Used: Forceps;Scissors Selective Debridement - Tissue Removed: yellow necrotic/unviable  tissue   Wound Assessment and Plan  Wound Therapy - Assess/Plan/Recommendations Wound Therapy - Clinical Statement: Overall less purulent drainage noted this session however wound beds continue to fill with purulent drainage throughout treatment. This patient will benefit  from continued hydrotherapy for selective removal of unviable tissue, decrease bioburden, and promote wound bed healing.  Wound Therapy - Functional Problem List: Acute pain, decreased tolerance for position changes Factors Delaying/Impairing Wound Healing: Diabetes Mellitus;Infection - systemic/local Hydrotherapy Plan: Debridement;Dressing change;Patient/family education;Pulsatile lavage with suction Wound Therapy - Frequency: 6X / week Wound Therapy - Follow Up Recommendations: Home health RN Wound Plan: See above  Wound Therapy Goals- Improve the function of patient's integumentary system by progressing the wound(s) through the phases of wound healing (inflammation - proliferation - remodeling) by: Decrease Necrotic Tissue to: 0% both wounds Decrease Necrotic Tissue - Progress: Progressing toward goal Increase Granulation Tissue to: 100% both wounds Increase Granulation Tissue - Progress: Progressing toward goal Improve Drainage Characteristics: Min;Serous Improve Drainage Characteristics - Progress: Progressing toward goal Goals/treatment plan/discharge plan were made with and agreed upon by patient/family: Yes Time For Goal Achievement: 7 days Wound Therapy - Potential for Goals: Good  Goals will be updated until maximal potential achieved or discharge criteria met.  Discharge criteria: when goals achieved, discharge from hospital, MD decision/surgical intervention, no progress towards goals, refusal/missing three consecutive treatments without notification or medical reason.  GP     Thelma Comp 03/25/2018, 2:40 PM   Rolinda Roan, PT, DPT Acute Rehabilitation Services Pager: [phone] Office: [phone]

## 2018-03-25 NOTE — Progress Notes (Signed)
Pt's wife/significant other is at bedside and advised this nurse that pt's last bowel movement was bloody approx 8 days ago. Pt needs something to help move bowels.

## 2018-03-26 LAB — BASIC METABOLIC PANEL
ANION GAP: 13 (ref 5–15)
BUN: 15 mg/dL (ref 6–20)
CALCIUM: 8.9 mg/dL (ref 8.9–10.3)
CO2: 22 mmol/L (ref 22–32)
CREATININE: 1.08 mg/dL (ref 0.61–1.24)
Chloride: 103 mmol/L (ref 98–111)
GFR calc Af Amer: >60 mL/min (ref >60)
GFR calc non Af Amer: >60 mL/min (ref >60)
GLUCOSE: 319 mg/dL — AB (ref 70–99)
Potassium: 3.5 mmol/L (ref 3.5–5.1)
Sodium: 138 mmol/L (ref 135–145)

## 2018-03-26 LAB — CBC
HCT: 31.4 % — ABNORMAL LOW (ref 39.0–52.0)
Hemoglobin: 10.2 g/dL — ABNORMAL LOW (ref 13.0–17.0)
MCH: 30.9 pg (ref 26.0–34.0)
MCHC: 32.5 g/dL (ref 30.0–36.0)
MCV: 95.2 fL (ref 78.0–100.0)
PLATELETS: 289 10*3/uL (ref 150–400)
RBC: 3.3 MIL/uL — ABNORMAL LOW (ref 4.22–5.81)
RDW: 13.2 % (ref 11.5–15.5)
WBC: 15.4 10*3/uL — AB (ref 4.0–10.5)

## 2018-03-26 LAB — GLUCOSE, CAPILLARY
GLUCOSE-CAPILLARY: 312 mg/dL — AB (ref 70–99)
GLUCOSE-CAPILLARY: 133 mg/dL — AB (ref 70–99)
Glucose-Capillary: 280 mg/dL — ABNORMAL HIGH (ref 70–99)
Glucose-Capillary: 254 mg/dL — ABNORMAL HIGH (ref 70–99)

## 2018-03-26 MED ORDER — TAMSULOSIN HCL 0.4 MG PO CAPS
0.4000 mg | ORAL_CAPSULE | Freq: Every day | ORAL | Status: DC
  Administered 2018-03-27 – 2018-04-08 (×13): 0.4 mg via ORAL
  Filled 2018-03-26 (×13): qty 1

## 2018-03-26 MED ORDER — CLONIDINE HCL 0.1 MG PO TABS
0.1000 mg | ORAL_TABLET | Freq: Two times a day (BID) | ORAL | Status: DC
  Administered 2018-03-26 – 2018-03-30 (×7): 0.1 mg via ORAL
  Filled 2018-03-26 (×8): qty 1

## 2018-03-26 MED ORDER — OXYCODONE HCL 5 MG PO TABS
5.0000 mg | ORAL_TABLET | Freq: Once | ORAL | Status: AC
  Administered 2018-03-26: 5 mg via ORAL
  Filled 2018-03-26: qty 1

## 2018-03-26 MED ORDER — INSULIN GLARGINE 100 UNIT/ML ~~LOC~~ SOLN
25.0000 [IU] | Freq: Every day | SUBCUTANEOUS | Status: DC
  Administered 2018-03-26 – 2018-04-15 (×21): 25 [IU] via SUBCUTANEOUS
  Filled 2018-03-26 (×23): qty 0.25

## 2018-03-26 MED ORDER — SENNOSIDES-DOCUSATE SODIUM 8.6-50 MG PO TABS
2.0000 | ORAL_TABLET | Freq: Every day | ORAL | Status: DC
  Filled 2018-03-26 (×10): qty 2

## 2018-03-26 NOTE — Progress Notes (Signed)
Patient stomach is firm and distended. He was given a suppository. He had a one small and one large.

## 2018-03-26 NOTE — Progress Notes (Signed)
Physical Therapy Wound Treatment Patient Details  Name: Tony Long MRN: [id number] Date of Birth: 31-Dec-1975  Today's Date: 03/26/2018 Time: 1135-1225 Time Calculation (min): 50 min  Subjective  Subjective: Pleasant and agreeable to hydrotherapy Patient and Family Stated Goals: Heal wound Prior Treatments: 03/22/18 I&D lumbar wounds  Pain Score: Pt appears to have moderate pain throughout treatment session but overall tolerated well.   Wound Assessment  Wound / Incision (Open or Dehisced) 03/23/18 Incision - Open Lumbar Upper (Active)  Dressing Type ABD;Gauze (Comment);Moist to dry;Barrier Film (skin prep) 03/26/2018  1:42 PM  Dressing Changed Changed 03/26/2018  1:42 PM  Dressing Status Clean;Dry;Intact 03/26/2018  1:42 PM  Dressing Change Frequency Daily 03/26/2018  1:42 PM  Site / Wound Assessment Yellow;Pink;Pale 03/26/2018  1:42 PM  % Wound base Red or Granulating 70% 03/26/2018  1:42 PM  % Wound base Yellow/Fibrinous Exudate 30% 03/26/2018  1:42 PM  % Wound base Black/Eschar 0% 03/26/2018  1:42 PM  % Wound base Other/Granulation Tissue (Comment) 0% 03/26/2018  1:42 PM  Peri-wound Assessment Intact 03/26/2018  1:42 PM  Wound Length (cm) 3 cm 03/23/2018  1:00 PM  Wound Width (cm) 1.7 cm 03/23/2018  1:00 PM  Wound Depth (cm) 1.4 cm 03/23/2018  1:00 PM  Wound Volume (cm^3) 7.14 cm^3 03/23/2018  1:00 PM  Wound Surface Area (cm^2) 5.1 cm^2 03/23/2018  1:00 PM  Undermining (cm) 3.5cm 11-12 o'clock; 4.1cm 12-1 o'clock; 2.6cm 3'oclock;2.5cm 5 o'clock; 2.9cm 6 o'clock; 2.5cm 7 o'clock 03/23/2018  1:00 PM  Margins Unattached edges (unapproximated) 03/26/2018  1:42 PM  Closure None 03/26/2018  1:42 PM  Drainage Amount Moderate 03/26/2018  1:42 PM  Drainage Description Purulent 03/26/2018  1:42 PM  Treatment Debridement (Selective);Hydrotherapy (Pulse lavage);Packing (Saline gauze) 03/26/2018  1:42 PM     Wound / Incision (Open or Dehisced) 03/23/18 Incision - Open Lumbar Lower (Active)  Dressing Type  ABD;Barrier Film (skin prep);Moist to dry 03/26/2018  1:42 PM  Dressing Changed Changed 03/26/2018  1:42 PM  Dressing Status Clean;Dry;Intact 03/26/2018  1:42 PM  Dressing Change Frequency Daily 03/26/2018  1:42 PM  Site / Wound Assessment Pink;Yellow 03/26/2018  1:42 PM  % Wound base Red or Granulating 30% 03/26/2018  1:42 PM  % Wound base Yellow/Fibrinous Exudate 30% 03/26/2018  1:42 PM  % Wound base Black/Eschar 0% 03/26/2018  1:42 PM  % Wound base Other/Granulation Tissue (Comment) 40% 03/26/2018  1:42 PM  Peri-wound Assessment Intact 03/26/2018  1:42 PM  Wound Length (cm) 3.3 cm 03/23/2018  1:00 PM  Wound Width (cm) 1.8 cm 03/23/2018  1:00 PM  Wound Depth (cm) 1.5 cm 03/23/2018  1:00 PM  Wound Volume (cm^3) 8.91 cm^3 03/23/2018  1:00 PM  Wound Surface Area (cm^2) 5.94 cm^2 03/23/2018  1:00 PM  Undermining (cm) 2.8cm 12 o'clock; 3.0cm 3 o'clock; 2.2cm 3-6 o'clock;  4.5cm 9-10 o'clock; 4.8cm 11 o'clock 03/23/2018  1:00 PM  Margins Unattached edges (unapproximated) 03/26/2018  1:42 PM  Closure None 03/26/2018  1:42 PM  Drainage Amount Moderate 03/26/2018  1:42 PM  Drainage Description Purulent 03/26/2018  1:42 PM  Treatment Debridement (Selective);Hydrotherapy (Pulse lavage);Packing (Saline gauze) 03/26/2018  1:42 PM   Hydrotherapy Pulsed lavage therapy - wound location: x2 lumbar wounds Pulsed Lavage with Suction (psi): 12 psi Pulsed Lavage with Suction - Normal Saline Used: 2000 mL Pulsed Lavage Tip: Tip with splash shield Selective Debridement Selective Debridement - Location: x2 lumbar wounds Selective Debridement - Tools Used: Forceps;Scissors Selective Debridement - Tissue Removed: yellow necrotic/unviable tissue  Wound Assessment and Plan  Wound Therapy - Assess/Plan/Recommendations Wound Therapy - Clinical Statement: Overall less purulent drainage noted this session. Notified charge nurse that packing removed this treatment session was placed by this therapist yesterday, and only the outer  ABD pad had been changed (dressing changes are supposed to be TID). This patient will benefit from continued hydrotherapy for selective removal of unviable tissue, decrease bioburden, and promote wound bed healing.  Wound Therapy - Functional Problem List: Acute pain, decreased tolerance for position changes Factors Delaying/Impairing Wound Healing: Diabetes Mellitus;Infection - systemic/local Hydrotherapy Plan: Debridement;Dressing change;Patient/family education;Pulsatile lavage with suction Wound Therapy - Frequency: 6X / week Wound Therapy - Follow Up Recommendations: Home health RN Wound Plan: See above  Wound Therapy Goals- Improve the function of patient's integumentary system by progressing the wound(s) through the phases of wound healing (inflammation - proliferation - remodeling) by: Decrease Necrotic Tissue to: 0% both wounds Decrease Necrotic Tissue - Progress: Progressing toward goal Increase Granulation Tissue to: 100% both wounds Increase Granulation Tissue - Progress: Progressing toward goal Improve Drainage Characteristics: Min;Serous Improve Drainage Characteristics - Progress: Progressing toward goal Goals/treatment plan/discharge plan were made with and agreed upon by patient/family: Yes Time For Goal Achievement: 7 days Wound Therapy - Potential for Goals: Good  Goals will be updated until maximal potential achieved or discharge criteria met.  Discharge criteria: when goals achieved, discharge from hospital, MD decision/surgical intervention, no progress towards goals, refusal/missing three consecutive treatments without notification or medical reason.  GP     Thelma Comp 03/26/2018, 1:48 PM  Rolinda Roan, PT, DPT Acute Rehabilitation Services Pager: [phone] Office: [phone]

## 2018-03-26 NOTE — Progress Notes (Signed)
Inpatient Diabetes Program Recommendations  AACE/ADA: New Consensus Statement on Inpatient Glycemic Control (2015)  Target Ranges:  Prepandial:   less than 140 mg/dL      Peak postprandial:   less than 180 mg/dL (1-2 hours)      Critically ill patients:  140 - 180 mg/dL   Lab Results  Component Value Date   GLUCAP 280 (H) 03/26/2018   HGBA1C 12.6 (A) 02/26/2018    Review of Glycemic ControlResults for ISIDOR, BROMELL (MRN [id number]) as of 03/26/2018 10:53  Ref. Range 03/25/2018 08:10 03/25/2018 12:36 03/25/2018 17:29 03/25/2018 21:01 03/26/2018 08:33  Glucose-Capillary Latest Ref Range: 70 - 99 mg/dL 191 (H) 478 (H) 295 (H) 178 (H) 280 (H)   Diabetes history:DM Outpatient Diabetes medications: Lantus 40 units q HS, Humalog 10 units tid with meals (per patient he was supposed to be taking 20 units tid with meals), Metformin 500 mg bid Current orders for Inpatient glycemic control: Novolog sensitive tid with meals, Lantus 20 units q HS Inpatient Diabetes Program Recommendations:    Please consider increasing Lantus to 25 units q HS.   Thanks  Beryl Meager, RN, BC-ADM Inpatient Diabetes Coordinator Pager [phone] (8a-5p)

## 2018-03-26 NOTE — Progress Notes (Signed)
PROGRESS NOTE        PATIENT DETAILS Name: Tony Long Age: 42 y.o. Sex: male Date of Birth: [birth year] Admit Date: 03/20/2018 Admitting Physician Kendell Bane, MD [id number], Rolm Gala, FNP  Brief Narrative: [name] a 42 y.o. male history of IVDA, DM-2, hypertension admitted for worsening back pain-further evaluation revealed MSSA bacteremia with tricuspid valve endocarditis, epidural abscess involving C2 through sacrum, and numerous soft tissue back abscesses.  Evaluated by general surgery-underwent I&D of her lower back soft tissue abscess, evaluated by neurosurgery-not felt to be a candidate for decompressive surgery due to extensive nature of the disease and lack of any significant neurological findings.  ID following with plans to continue Ancef with stop date of 05/25/2018.  See below for further details  Subjective: Distended bladder this morning-patient claims he is still urinating without any major issues.  Claims he still ambulating to the bathroom.  Moving all 4 extremities.  Has back pain at the operative site.  Assessment/Plan: MSSA bacteremia with tricuspid valve endocarditis, extensive epidural abscess (C2 through sacrum) and large soft tissue lower back abscess: Remains stable-due to extensive nature of the epidural abscess and lack of concerning neurological findings-neurosurgery does not recommend decompressive surgery.  ID recommending continuing IV antibiotics with stop date of 11/26.  Given history of IVDA-not a candidate for outpatient IV antimicrobial therapy.  If patient cannot be placed to SNF-he will likely remain inpatient.  Patient aware of the extensive nature of this infection-with life-threatening and life disabling risks including quadriplegia if he leaves AGAINST MEDICAL ADVICE.  DM-2: Had episodes of hypoglycemia on 9/25-Lantus changed to 20 units daily dosing-since CBGs remain uncontrolled-we will increase Lantus to 25  units.  Follow.    Hypertension: BP controlled-continue metoprolol, clonidine started for drug withdrawal symptoms-being slowly tapered down-change to twice daily dosing.   Constipation: Had bowel movement overnight-probably secondary to narcotic use-have placed on scheduled MiraLAX and senna  Acute urinary retention: Significantly distended bladder this morning-Foley catheter placed with more than 2 L of urine-start Flomax-we will discuss with neurosurgery-however neurological exam is unchanged.  Cocaine/Subutex abuse: Withdrawal symptoms much better-on 9/25 patient developed excessive sedation-Klonopin/Flexeril has been changed to as needed dosing-clonidine being tapered down.  He still has significant pain and requires narcotics.  Once his pain issues are better controlled-we can think of initiating Suboxone at some point.    Polysubstance abuse/IVDA: Counseled  DVT Prophylaxis: Prophylactic heparin  Code Status: Full code  Family Communication: Mother at bedside  Disposition Plan: Remain inpatient-will be inpatient until 11/26-if SNF placement not possible.    Antimicrobial agents: Anti-infectives (From admission, onward)   Start     Dose/Rate Route Frequency Ordered Stop   03/21/18 0930  ceFAZolin (ANCEF) IVPB 2g/100 mL premix     2 g 200 mL/hr over 30 Minutes Intravenous Every 8 hours 03/21/18 0920        Procedures: 9/23>> TEE 9/22>> PICC line  CONSULTS:  ID, general surgery and Neurosurgery  Time spent: 25- minutes-Greater than 50% of this time was spent in counseling, explanation of diagnosis, planning of further management, and coordination of care.  MEDICATIONS: Scheduled Meds: . cloNIDine  0.1 mg Oral TID  . gabapentin  300 mg Oral TID  . heparin injection (subcutaneous)  5,000 Units Subcutaneous Q8H  . insulin aspart  0-9 Units Subcutaneous TID WC  . insulin glargine  20 Units Subcutaneous QHS  . metoprolol tartrate  50 mg Oral BID  . nicotine  21 mg  Transdermal Daily  . polyethylene glycol  17 g Oral BID  . senna-docusate  1 tablet Oral BID  . sodium chloride flush  10-40 mL Intracatheter Q12H   Continuous Infusions: .  ceFAZolin (ANCEF) IV 2 g (03/26/18 0627)   PRN Meds:.bisacodyl, clonazepam, cyclobenzaprine, hydrALAZINE, magnesium hydroxide, metoprolol tartrate, oxyCODONE-acetaminophen, sodium phosphate   PHYSICAL EXAM: Vital signs: Vitals:   03/25/18 1409 03/25/18 2101 03/26/18 0617 03/26/18 1304  BP: 100/65 133/83 129/83 108/63  Pulse: 85 96 90 85  Resp: 16 18 19 18   Temp: 99.7 F (37.6 C) 98.7 F (37.1 C) 98.1 F (36.7 C) 99 F (37.2 C)  TempSrc: Oral Oral Oral Oral  SpO2: 94% 100% 99% 97%  Weight:      Height:       Filed Weights   03/21/18 0602  Weight: 68 kg   Body mass index is 20.92 kg/m.   General appearance:Awake, alert, not in any distress.  Eyes:no scleral icterus. HEENT: Atraumatic and Normocephalic Neck: supple, no JVD. Resp:Good air entry bilaterally,no rales or rhonchi CVS: S1 S2 regular, no murmurs.  GI: Bowel sounds present, Non tender and not distended with no gaurding, rigidity or rebound.  Form distended bladder in the pelvic area Extremities: B/L Lower Ext shows no edema, both legs are warm to touch Neurology: Moves all 4 extremities-5/5 strength all over. Musculoskeletal:No digital cyanosis Skin:No Rash, warm and dry Wounds:N/A  I have personally reviewed following labs and imaging studies  LABORATORY DATA: CBC: Recent Labs  Lab 03/20/18 0759 03/21/18 1337 03/22/18 0339 03/23/18 0439 03/24/18 0434 03/26/18 0640  WBC 22.2* 26.3* 25.3* 21.2* 16.7* 15.4*  NEUTROABS 19.7*  --   --   --   --   --   HGB 10.5* 10.3* 11.2* 11.0* 11.4* 10.2*  HCT 32.1* 34.8* 35.3* 34.9* 35.7* 31.4*  MCV 95.6 103.6* 97.0 96.4 97.3 95.2  PLT 388 374 386 311 293 289    Basic Metabolic Panel: Recent Labs  Lab 03/20/18 0759  03/21/18 1338 03/21/18 2228 03/22/18 0339 03/23/18 0439  03/24/18 0434 03/26/18 0640  NA 138   < > 136 136 141 138 140 138  K 3.7   < > 3.4* 2.8* 2.9* 3.1* 2.9* 3.5  CL 92*   < > 102 106 107 106 104 103  CO2 18*   < > 9* 18* 21* 22 24 22   GLUCOSE 368*   < > 348* 143* 127* 281* 174* 319*  BUN 11   < > 14 11 9 18 17 15   CREATININE 0.97   < > 1.53* 0.93 0.81 0.73 0.64 1.08  CALCIUM 9.2   < > 9.3 9.3 9.4 9.0 9.3 8.9  MG 1.8  --  1.6*  --  1.7 1.8 1.9  --    < > = values in this interval not displayed.    GFR: Estimated Creatinine Clearance: 86.6 mL/min (by C-G formula based on SCr of 1.08 mg/dL).  Liver Function Tests: Recent Labs  Lab 03/20/18 0759 03/22/18 0339 03/23/18 0439 03/24/18 0434  AST 13* 9* 9* 17  ALT 8 8 7   BLOOD 9  ALKPHOS 165* 154* 126 121  BILITOT 2.3* 0.8 0.5 0.5  PROT 7.5 7.0 6.4* 6.6  ALBUMIN 2.3* 2.0* 1.8* 1.9*   Recent Labs  Lab 03/20/18 0759  LIPASE 15   No results for input(s): AMMONIA in the last 168  hours.  Coagulation Profile: Recent Labs  Lab 03/20/18 0759 03/21/18 1337  INR 1.18 1.40    Cardiac Enzymes: Recent Labs  Lab 03/20/18 0759  CKTOTAL 19*    BNP (last 3 results) No results for input(s): PROBNP in the last 8760 hours.  HbA1C: No results for input(s): HGBA1C in the last 72 hours.  CBG: Recent Labs  Lab 03/25/18 1236 03/25/18 1729 03/25/18 2101 03/26/18 0833 03/26/18 1147  GLUCAP 182* 240* 178* 280* 312*    Lipid Profile: No results for input(s): CHOL, HDL, LDLCALC, TRIG, CHOLHDL, LDLDIRECT in the last 72 hours.  Thyroid Function Tests: No results for input(s): TSH, T4TOTAL, FREET4, T3FREE, THYROIDAB in the last 72 hours.  Anemia Panel: No results for input(s): VITAMINB12, FOLATE, FERRITIN, TIBC, IRON, RETICCTPCT in the last 72 hours.  Urine analysis:    Component Value Date/Time   COLORURINE YELLOW (A) 03/20/2018 1622   APPEARANCEUR CLEAR (A) 03/20/2018 1622   LABSPEC 1.015 03/20/2018 1622   PHURINE 5.0 03/20/2018 1622   GLUCOSEU >500 (A) 03/20/2018 1622    HGBUR SMALL (A) 03/20/2018 1622   BILIRUBINUR NEGATIVE 03/20/2018 1622   BILIRUBINUR small 02/26/2018 1202   KETONESUR 80 (A) 03/20/2018 1622   PROTEINUR NEGATIVE 03/20/2018 1622   UROBILINOGEN 1.0 02/26/2018 1202   UROBILINOGEN 1.0 12/11/2016 1020   NITRITE NEGATIVE 03/20/2018 1622   LEUKOCYTESUR NEGATIVE 03/20/2018 1622    Sepsis Labs: Lactic Acid, Venous    Component Value Date/Time   LATICACIDVEN 1.4 03/20/2018 1235    MICROBIOLOGY: Recent Results (from the past 240 hour(s))  Blood Culture (routine x 2)     Status: Abnormal   Collection Time: 03/20/18  7:59 AM  Result Value Ref Range Status   Specimen Description   Final    BLOOD EJ Performed at [facility], [address], [place], [place] [address]    Special Requests   Final    BOTTLES DRAWN AEROBIC AND ANAEROBIC Blood Culture adequate volume Performed at [facility], [address] [place], [place] [address]    Culture  Setup Time   Final    GRAM POSITIVE COCCI IN BOTH AEROBIC AND ANAEROBIC BOTTLES CRITICAL RESULT CALLED TO, READ BACK BY AND VERIFIED WITH: MATT MCBANE ON 03/21/18 AT 0001 QSD Performed at [facility], [address] [place], [place] [address]    Culture (A)  Final    STAPHYLOCOCCUS AUREUS SUSCEPTIBILITIES PERFORMED ON PREVIOUS CULTURE WITHIN THE LAST 5 DAYS. Performed at [facility], [address], [place], [place] [address]    Report Status 03/23/2018 FINAL  Final  Blood Culture (routine x 2)     Status: Abnormal   Collection Time: 03/20/18  7:59 AM  Result Value Ref Range Status   Specimen Description   Final    BLOOD EJ Performed at [name], [address] [place], [place] [address]    Special Requests   Final    BOTTLES DRAWN AEROBIC AND ANAEROBIC Blood Culture adequate volume Performed at [facility], [address] [place], [place] [address]    Culture  Setup Time   Final    GRAM POSITIVE COCCI IN BOTH AEROBIC  AND ANAEROBIC BOTTLES CRITICAL RESULT CALLED TO, READ BACK BY AND VERIFIED WITH: MATT MCBANE ON 03/21/18 AT 0001 QSD Performed at [facility], [address] [place], [place] [address]    Culture STAPHYLOCOCCUS AUREUS (A)  Final   Report Status 03/23/2018 FINAL  Final   Organism  ID, Bacteria STAPHYLOCOCCUS AUREUS  Final      Susceptibility   Staphylococcus aureus - MIC*    CIPROFLOXACIN <=0.5 SENSITIVE Sensitive     ERYTHROMYCIN <=0.25 SENSITIVE Sensitive     GENTAMICIN <=0.5 SENSITIVE Sensitive     OXACILLIN <=0.25 SENSITIVE Sensitive     TETRACYCLINE <=1 SENSITIVE Sensitive     VANCOMYCIN <=0.5 SENSITIVE Sensitive     TRIMETH/SULFA <=10 SENSITIVE Sensitive     CLINDAMYCIN <=0.25 SENSITIVE Sensitive     RIFAMPIN <=0.5 SENSITIVE Sensitive     Inducible Clindamycin NEGATIVE Sensitive     * STAPHYLOCOCCUS AUREUS  Blood Culture ID Panel (Reflexed)     Status: Abnormal   Collection Time: 03/20/18  7:59 AM  Result Value Ref Range Status   Enterococcus species NOT DETECTED NOT DETECTED Final   Listeria monocytogenes NOT DETECTED NOT DETECTED Final   Staphylococcus species DETECTED (A) NOT DETECTED Final    Comment: CRITICAL RESULT CALLED TO, READ BACK BY AND VERIFIED WITH: MATT MCBANE ON 03/21/18 AT 0001 QSD    Staphylococcus aureus DETECTED (A) NOT DETECTED Final    Comment: Methicillin (oxacillin) susceptible Staphylococcus aureus (MSSA). Preferred therapy is anti staphylococcal beta lactam antibiotic (Cefazolin or Nafcillin), unless clinically contraindicated. CRITICAL RESULT CALLED TO, READ BACK BY AND VERIFIED WITH: MATT MCBANE ON 03/21/18 AT 0001 QSD    Methicillin resistance NOT DETECTED NOT DETECTED Final   Streptococcus species NOT DETECTED NOT DETECTED Final   Streptococcus agalactiae NOT DETECTED NOT DETECTED Final   Streptococcus pneumoniae NOT DETECTED NOT DETECTED Final   Streptococcus pyogenes NOT DETECTED NOT DETECTED Final   Acinetobacter baumannii NOT DETECTED  NOT DETECTED Final   Enterobacteriaceae species NOT DETECTED NOT DETECTED Final   Enterobacter cloacae complex NOT DETECTED NOT DETECTED Final   Escherichia coli NOT DETECTED NOT DETECTED Final   Klebsiella oxytoca NOT DETECTED NOT DETECTED Final   Klebsiella pneumoniae NOT DETECTED NOT DETECTED Final   Proteus species NOT DETECTED NOT DETECTED Final   Serratia marcescens NOT DETECTED NOT DETECTED Final   Haemophilus influenzae NOT DETECTED NOT DETECTED Final   Neisseria meningitidis NOT DETECTED NOT DETECTED Final   Pseudomonas aeruginosa NOT DETECTED NOT DETECTED Final   Candida albicans NOT DETECTED NOT DETECTED Final   Candida glabrata NOT DETECTED NOT DETECTED Final   Candida krusei NOT DETECTED NOT DETECTED Final   Candida parapsilosis NOT DETECTED NOT DETECTED Final   Candida tropicalis NOT DETECTED NOT DETECTED Final    Comment: Performed at [name], [address] [place], [place] [address]  MRSA PCR Screening     Status: None   Collection Time: 03/20/18  3:50 PM  Result Value Ref Range Status   MRSA by PCR NEGATIVE NEGATIVE Final    Comment:        The GeneXpert MRSA Assay (FDA approved for NASAL specimens only), is one component of a comprehensive MRSA colonization surveillance program. It is not intended to diagnose MRSA infection nor to guide or monitor treatment for MRSA infections. Performed at [facility], [address], [place], [place] [address]   Anaerobic culture     Status: None (Preliminary result)   Collection Time: 03/22/18 10:36 AM  Result Value Ref Range Status   Specimen Description ABSCESS BACK  Final   Special Requests NONE  Final   Gram Stain   Final    RARE WBC PRESENT, PREDOMINANTLY MONONUCLEAR ABUNDANT GRAM POSITIVE COCCI Performed at [facility], [address] [place],  [place] [address]    Culture   Final    NO ANAEROBES ISOLATED; CULTURE IN PROGRESS FOR 5 DAYS   Report Status PENDING  Incomplete  Aerobic  Culture (superficial specimen)     Status: None   Collection Time: 03/22/18 10:36 AM  Result Value Ref Range Status   Specimen Description ABSCESS BACK  Final   Special Requests NONE  Final   Gram Stain   Final    FEW WBC PRESENT,BOTH PMN AND MONONUCLEAR MODERATE GRAM POSITIVE COCCI Performed at [name], [address] [place], [place] [address]    Culture ABUNDANT STAPHYLOCOCCUS AUREUS  Final   Report Status 03/24/2018 FINAL  Final   Organism ID, Bacteria STAPHYLOCOCCUS AUREUS  Final      Susceptibility   Staphylococcus aureus - MIC*    CIPROFLOXACIN <=0.5 SENSITIVE Sensitive     ERYTHROMYCIN <=0.25 SENSITIVE Sensitive     GENTAMICIN <=0.5 SENSITIVE Sensitive     OXACILLIN 0.5 SENSITIVE Sensitive     TETRACYCLINE <=1 SENSITIVE Sensitive     VANCOMYCIN 1 SENSITIVE Sensitive     TRIMETH/SULFA <=10 SENSITIVE Sensitive     CLINDAMYCIN <=0.25 SENSITIVE Sensitive     RIFAMPIN <=0.5 SENSITIVE Sensitive     Inducible Clindamycin NEGATIVE Sensitive     * ABUNDANT STAPHYLOCOCCUS AUREUS  Culture, blood (Routine X 2) w Reflex to ID Panel     Status: None (Preliminary result)   Collection Time: 03/23/18  4:39 AM  Result Value Ref Range Status   Specimen Description BLOOD RIGHT HAND  Final   Special Requests   Final    BOTTLES DRAWN AEROBIC ONLY Blood Culture adequate volume   Culture   Final    NO GROWTH 3 DAYS Performed at [facility], [address] [place], [place] [address]    Report Status PENDING  Incomplete    RADIOLOGY STUDIES/RESULTS: Ct Thoracic Spine Wo Contrast  Result Date: 03/20/2018 CLINICAL DATA:  Progressive back pain.  Swelling of the lower back. EXAM: CT THORACIC SPINE WITHOUT CONTRAST TECHNIQUE: Multidetector CT images of the thoracic were obtained using the standard protocol without intravenous contrast. COMPARISON:  None. FINDINGS: Alignment: Normal. 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 Paraspinal and other soft tissues: There is  abnormal gas and fluid in the soft tissues of the right side of the base of the neck and in the right supraclavicular region. Paraspinal soft tissues appear normal throughout the thoracic spine. Disc levels: There is no evidence of disc protrusion or significant disc bulging or spinal or foraminal stenosis or other significant abnormality of the thoracic spine. IMPRESSION: 1. Evidence of cellulitis involving the right side of the base of the neck extending into the right supraclavicular region with fluid and gas in the soft tissues at the base of the right side of the neck. 2. No significant abnormality of the thoracic spine. 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 Electronically Signed   By: Francene Boyers M.D.   On: 03/20/2018 11:25   Ct Lumbar Spine Wo Contrast  Addendum Date: 03/20/2018   ADDENDUM REPORT: 03/20/2018 11:44 ADDENDUM: Critical Value/emergent results were called by telephone at the time of interpretation on 03/20/2018 at 11:30 am to Dr. Sharyn Creamer , who verbally acknowledged these results. Electronically Signed   By: Francene Boyers M.D.   On: 03/20/2018 11:44   Result Date: 03/20/2018 CLINICAL DATA:  Increasing low back pain and soft tissue swelling. EXAM: CT LUMBAR SPINE WITHOUT CONTRAST TECHNIQUE: Multidetector CT imaging of the  lumbar spine was performed without intravenous contrast administration. Multiplanar CT image reconstructions were also generated. IV contrast could not be utilized due to the lack of an appropriate IV. COMPARISON:  None. FINDINGS: Segmentation: 5 lumbar type vertebrae. Alignment: Normal. Vertebrae: There is a moth-eaten appearance of the spinous processes of L3 and L4 which is worrisome for osteomyelitis. Bilateral pars defects at L5 with grade 1 spondylolisthesis. 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 Paraspinal and other soft tissues: There is an extensive abnormal fluid collection in the subcutaneous soft tissues of the posterior aspect of the back extending from  approximately L1-2 to S3. This fluid collection is lobulated and measures approximately 20 x 9 x 2.5 cm. It is centered slightly to the left of midline and has a mass effect upon the adjacent posterior paraspinal muscles. There is abnormal lucency in the underlying paraspinal muscles which could represent myositis. Disc levels: T11-12: No significant abnormality. Butterfly T12 vertebra. T12-L1: No significant abnormality. L1-2: Normal disc. Abnormal edema in the posterior paraspinal musculature with adjacent fluid collection in the subcutaneous fat of the posterior aspect of the back as described above. L2-3: Normal disc. L3-4: Normal disc. Lucency in the posterior paraspinal soft tissues extends to the posterior aspect of the thecal sac on image 80 of series 4 but there is no discrete epidural abscess. L4-5: Normal disc.  No evidence of epidural abscess. L5-S1: Grade 1 spondylolisthesis. No disc bulging or protrusion. Bilateral pars defects. No visible epidural abscess. IMPRESSION: 1. Extensive abnormal fluid collection in the subcutaneous fat of the midline of the back with underlying marked abnormality of the posterior paraspinal musculature from L1-2 through S3. This is worrisome for subcutaneous abscess and myositis. 2. Moth-eaten appearance of the spinous processes of L3 and L4 consistent with osteomyelitis. 3. No discrete epidural abscess. However, the abnormal edema in the paraspinal musculature extends to the posterior aspect of the spinal canal at L3-4. 4. MRI with and without contrast may better define the extent of the soft tissue and infection and could detect epidural extension that is not apparent on this unenhanced CT scan. Electronically Signed: By: Francene Boyers M.D. On: 03/20/2018 11:18   Mr Cervical Spine W Wo Contrast  Result Date: 03/22/2018 CLINICAL DATA:  Spine infection. EXAM: MRI TOTAL SPINE WITHOUT AND WITH CONTRAST TECHNIQUE: Multisequence MR imaging of the spine from the cervical  spine to the sacrum was performed prior to and following IV contrast administration. CONTRAST:  6 cc Gadavist intravenous COMPARISON:  CT of the thoracic and lumbar spine from 2 days ago FINDINGS: MRI CERVICAL SPINE FINDINGS Alignment: Normal Vertebrae: No evidence of osseous infection. Canal/Cord: There is extensive spinal fluid collection preferentially in the ventral but also in the right more than left dorsal canal. Subarachnoid space is diffusely effaced. Maximal thickness is posterior to C2 at 9 mm. No cord signal abnormality. Posterior Fossa, vertebral arteries, paraspinal tissues: No retropharyngeal or other discrete soft tissue collection. Disc levels: C2-3: Unremarkable. C3-4: Small left foraminal protrusion with moderate narrowing C4-5: Unremarkable. C5-6: Disc narrowing and bulging with asymmetric left uncovertebral spurring. Left foraminal impingement C6-7: Right foraminal protrusion mild narrowing. C7-T1:Unremarkable. MRI THORACIC SPINE FINDINGS Alignment:  Normal Vertebrae: No evidence of osteomyelitis or discitis. T12 butterfly vertebra. Canal/Cord: Cervical ventral epidural collection continues throughout the thoracic levels. There is also a focal dorsal component at T3-4 to T5-6, where thecal sac effacement is accentuated and there is cord flattening. No cord edema. Paraspinal and other soft tissues: Paraspinous phlegmon on the left at T8-T12,  with new complex left pleural effusion and lower lobe atelectasis Disc levels: T5-6 central disc protrusion. MRI LUMBAR SPINE FINDINGS Segmentation:  5 lumbar type vertebral bodies Alignment:  Grade 1 anterolisthesis at L5-S1. Vertebrae: Marrow edema and heterogeneous enhancement within the L2, L3, and L4 spinous processes. No discitis or facet edema. Conus medullaris: Extends to the L1 level and is non edematous. There is extensive epidural collection completely effacing the thecal sac throughout the lumbar spine until L4-5 and below where the collection  becomes ventral and right eccentric. The infection communicates with extensive bilateral abscess within the intrinsic back muscles via the interspinous space at L3-4. Patient had recent subcutaneous collection and left buttocks collection drainage, with packing seen in place. This midline, upper subcutaneous collection communicates with the paravertebral abscess along its superior margin based on postcontrast axial images. Paraspinal and other soft tissues: As above.  Distended bladder Disc levels: Chronic bilateral pars defects at L5. Critical Value/emergent results were called by telephone at the time of interpretation on 03/22/2018 at 3:04 pm to Dr. Thedore Mins , who verbally acknowledged these results. IMPRESSION: 1. Epidural abscess from C2 to sacrum as described. Maximal cord compression from T3-4 to T5-6. The thecal sac is completely effaced from L1 to L4-5. At the L3-4 interspinous space the spinal abscess communicates with large bilateral abscesses within the intrinsic back muscles. There is osteomyelitis of the L2, L3, and L4 spinous processes. No discitis or facet arthritis. 2. Left paravertebral abscess along the lower thoracic spine with small left empyema that is new from CT 2 days ago. 3. Distended bladder Electronically Signed   By: Marnee Spring M.D.   On: 03/22/2018 15:11   Mr Thoracic Spine W Wo Contrast  Result Date: 03/22/2018 CLINICAL DATA:  Spine infection. EXAM: MRI TOTAL SPINE WITHOUT AND WITH CONTRAST TECHNIQUE: Multisequence MR imaging of the spine from the cervical spine to the sacrum was performed prior to and following IV contrast administration. CONTRAST:  6 cc Gadavist intravenous COMPARISON:  CT of the thoracic and lumbar spine from 2 days ago FINDINGS: MRI CERVICAL SPINE FINDINGS Alignment: Normal Vertebrae: No evidence of osseous infection. Canal/Cord: There is extensive spinal fluid collection preferentially in the ventral but also in the right more than left dorsal canal.  Subarachnoid space is diffusely effaced. Maximal thickness is posterior to C2 at 9 mm. No cord signal abnormality. Posterior Fossa, vertebral arteries, paraspinal tissues: No retropharyngeal or other discrete soft tissue collection. Disc levels: C2-3: Unremarkable. C3-4: Small left foraminal protrusion with moderate narrowing C4-5: Unremarkable. C5-6: Disc narrowing and bulging with asymmetric left uncovertebral spurring. Left foraminal impingement C6-7: Right foraminal protrusion mild narrowing. C7-T1:Unremarkable. MRI THORACIC SPINE FINDINGS Alignment:  Normal Vertebrae: No evidence of osteomyelitis or discitis. T12 butterfly vertebra. Canal/Cord: Cervical ventral epidural collection continues throughout the thoracic levels. There is also a focal dorsal component at T3-4 to T5-6, where thecal sac effacement is accentuated and there is cord flattening. No cord edema. Paraspinal and other soft tissues: Paraspinous phlegmon on the left at T8-T12, with new complex left pleural effusion and lower lobe atelectasis Disc levels: T5-6 central disc protrusion. MRI LUMBAR SPINE FINDINGS Segmentation:  5 lumbar type vertebral bodies Alignment:  Grade 1 anterolisthesis at L5-S1. Vertebrae: Marrow edema and heterogeneous enhancement within the L2, L3, and L4 spinous processes. No discitis or facet edema. Conus medullaris: Extends to the L1 level and is non edematous. There is extensive epidural collection completely effacing the thecal sac throughout the lumbar spine until L4-5 and below  where the collection becomes ventral and right eccentric. The infection communicates with extensive bilateral abscess within the intrinsic back muscles via the interspinous space at L3-4. Patient had recent subcutaneous collection and left buttocks collection drainage, with packing seen in place. This midline, upper subcutaneous collection communicates with the paravertebral abscess along its superior margin based on postcontrast axial images.  Paraspinal and other soft tissues: As above.  Distended bladder Disc levels: Chronic bilateral pars defects at L5. Critical Value/emergent results were called by telephone at the time of interpretation on 03/22/2018 at 3:04 pm to Dr. Thedore Mins , who verbally acknowledged these results. IMPRESSION: 1. Epidural abscess from C2 to sacrum as described. Maximal cord compression from T3-4 to T5-6. The thecal sac is completely effaced from L1 to L4-5. At the L3-4 interspinous space the spinal abscess communicates with large bilateral abscesses within the intrinsic back muscles. There is osteomyelitis of the L2, L3, and L4 spinous processes. No discitis or facet arthritis. 2. Left paravertebral abscess along the lower thoracic spine with small left empyema that is new from CT 2 days ago. 3. Distended bladder Electronically Signed   By: Marnee Spring M.D.   On: 03/22/2018 15:11   Mr Lumbar Spine W Wo Contrast  Result Date: 03/22/2018 CLINICAL DATA:  Spine infection. EXAM: MRI TOTAL SPINE WITHOUT AND WITH CONTRAST TECHNIQUE: Multisequence MR imaging of the spine from the cervical spine to the sacrum was performed prior to and following IV contrast administration. CONTRAST:  6 cc Gadavist intravenous COMPARISON:  CT of the thoracic and lumbar spine from 2 days ago FINDINGS: MRI CERVICAL SPINE FINDINGS Alignment: Normal Vertebrae: No evidence of osseous infection. Canal/Cord: There is extensive spinal fluid collection preferentially in the ventral but also in the right more than left dorsal canal. Subarachnoid space is diffusely effaced. Maximal thickness is posterior to C2 at 9 mm. No cord signal abnormality. Posterior Fossa, vertebral arteries, paraspinal tissues: No retropharyngeal or other discrete soft tissue collection. Disc levels: C2-3: Unremarkable. C3-4: Small left foraminal protrusion with moderate narrowing C4-5: Unremarkable. C5-6: Disc narrowing and bulging with asymmetric left uncovertebral spurring. Left  foraminal impingement C6-7: Right foraminal protrusion mild narrowing. C7-T1:Unremarkable. MRI THORACIC SPINE FINDINGS Alignment:  Normal Vertebrae: No evidence of osteomyelitis or discitis. T12 butterfly vertebra. Canal/Cord: Cervical ventral epidural collection continues throughout the thoracic levels. There is also a focal dorsal component at T3-4 to T5-6, where thecal sac effacement is accentuated and there is cord flattening. No cord edema. Paraspinal and other soft tissues: Paraspinous phlegmon on the left at T8-T12, with new complex left pleural effusion and lower lobe atelectasis Disc levels: T5-6 central disc protrusion. MRI LUMBAR SPINE FINDINGS Segmentation:  5 lumbar type vertebral bodies Alignment:  Grade 1 anterolisthesis at L5-S1. Vertebrae: Marrow edema and heterogeneous enhancement within the L2, L3, and L4 spinous processes. No discitis or facet edema. Conus medullaris: Extends to the L1 level and is non edematous. There is extensive epidural collection completely effacing the thecal sac throughout the lumbar spine until L4-5 and below where the collection becomes ventral and right eccentric. The infection communicates with extensive bilateral abscess within the intrinsic back muscles via the interspinous space at L3-4. Patient had recent subcutaneous collection and left buttocks collection drainage, with packing seen in place. This midline, upper subcutaneous collection communicates with the paravertebral abscess along its superior margin based on postcontrast axial images. Paraspinal and other soft tissues: As above.  Distended bladder Disc levels: Chronic bilateral pars defects at L5. Critical Value/emergent results were  called by telephone at the time of interpretation on 03/22/2018 at 3:04 pm to Dr. Thedore Mins , who verbally acknowledged these results. IMPRESSION: 1. Epidural abscess from C2 to sacrum as described. Maximal cord compression from T3-4 to T5-6. The thecal sac is completely effaced from  L1 to L4-5. At the L3-4 interspinous space the spinal abscess communicates with large bilateral abscesses within the intrinsic back muscles. There is osteomyelitis of the L2, L3, and L4 spinous processes. No discitis or facet arthritis. 2. Left paravertebral abscess along the lower thoracic spine with small left empyema that is new from CT 2 days ago. 3. Distended bladder Electronically Signed   By: Marnee Spring M.D.   On: 03/22/2018 15:11   [place] Ekg Site Rite  Result Date: 03/21/2018 If Site Rite image not attached, placement could not be confirmed due to current cardiac rhythm.    LOS: 6 days   Jeoffrey Massed, MD  [facility]  If 7PM-7AM, please contact night-coverage  Please page via [email or web address] [email or web address]-click on MD name and type text message  03/26/2018, 1:08 PM

## 2018-03-26 NOTE — Progress Notes (Signed)
Subjective: Patient reports "I'm hurting in my back"   Objective: Vital signs in last 24 hours: Temp:  [98.1 F (36.7 C)-99.7 F (37.6 C)] 98.1 F (36.7 C) (09/27 0617) Pulse Rate:  [85-110] 90 (09/27 0617) Resp:  [16-19] 19 (09/27 0617) BP: (100-133)/(65-86) 129/83 (09/27 0617) SpO2:  [94 %-100 %] 99 % (09/27 0617)  Intake/Output from previous day: 09/26 0701 - 09/27 0700 In: 490 [P.O.:480; I.V.:10] Out: 2400 [Urine:2400] Intake/Output this shift: No intake/output data recorded.  Awake, conversant, reporting lumbar pain. MAEW. Neurologically intact with good strength all extremities. No numbness. Wounds not assessed - will defer to PT Wound Care.  Lab Results: Recent Labs    03/24/18 0434 03/26/18 0640  WBC 16.7* 15.4*  HGB 11.4* 10.2*  HCT 35.7* 31.4*  PLT 293 289   BMET Recent Labs    03/24/18 0434 03/26/18 0640  NA 140 138  K 2.9* 3.5  CL 104 103  CO2 24 22  GLUCOSE 174* 319*  BUN 17 15  CREATININE 0.64 1.08  CALCIUM 9.3 8.9    Studies/Results: No results found.  Assessment/Plan:   LOS: 6 days  Supportive care continues   Georgiann Cocker 03/26/2018, 8:23 AM

## 2018-03-27 DIAGNOSIS — I368 Other nonrheumatic tricuspid valve disorders: Secondary | ICD-10-CM

## 2018-03-27 LAB — ANAEROBIC CULTURE

## 2018-03-27 LAB — GLUCOSE, CAPILLARY
Glucose-Capillary: 93 mg/dL (ref 70–99)
Glucose-Capillary: 127 mg/dL — ABNORMAL HIGH (ref 70–99)
Glucose-Capillary: 243 mg/dL — ABNORMAL HIGH (ref 70–99)
Glucose-Capillary: 292 mg/dL — ABNORMAL HIGH (ref 70–99)

## 2018-03-27 NOTE — Progress Notes (Signed)
Physical Therapy Wound Treatment Patient Details  Name: Tony Long MRN: [id number] Date of Birth: 1975-09-27  Today's Date: 03/27/2018 Time: 7829-5621 Time Calculation (min): 34 min  Subjective  Subjective: Pleasant and agreeable to hydrotherapy Patient and Family Stated Goals: Heal wound Prior Treatments: 03/22/18 I&D lumbar wounds  Pain Score: Pain Score: 5   Wound Assessment  Wound / Incision (Open or Dehisced) 03/23/18 Incision - Open Lumbar Upper (Active)  Dressing Type ABD;Gauze (Comment);Moist to dry;Barrier Film (skin prep) 03/27/2018 12:34 PM  Dressing Changed Changed 03/27/2018 12:34 PM  Dressing Status New drainage 03/27/2018 12:34 PM  Dressing Change Frequency Daily 03/27/2018 12:34 PM  Site / Wound Assessment Yellow;Pink;Pale 03/27/2018 12:34 PM  % Wound base Red or Granulating 70% 03/27/2018 12:34 PM  % Wound base Yellow/Fibrinous Exudate 30% 03/27/2018 12:34 PM  % Wound base Black/Eschar 0% 03/27/2018 12:34 PM  % Wound base Other/Granulation Tissue (Comment) 0% 03/27/2018 12:34 PM  Peri-wound Assessment Intact 03/27/2018 12:34 PM  Wound Length (cm) 3 cm 03/23/2018  1:00 PM  Wound Width (cm) 1.7 cm 03/23/2018  1:00 PM  Wound Depth (cm) 1.4 cm 03/23/2018  1:00 PM  Wound Volume (cm^3) 7.14 cm^3 03/23/2018  1:00 PM  Wound Surface Area (cm^2) 5.1 cm^2 03/23/2018  1:00 PM  Undermining (cm) 3.5cm 11-12 o'clock; 4.1cm 12-1 o'clock; 2.6cm 3'oclock;2.5cm 5 o'clock; 2.9cm 6 o'clock; 2.5cm 7 o'clock 03/23/2018  1:00 PM  Margins Unattached edges (unapproximated) 03/27/2018 12:34 PM  Closure None 03/27/2018 12:34 PM  Drainage Amount Moderate 03/27/2018 12:34 PM  Drainage Description Purulent 03/27/2018 12:34 PM  Treatment Debridement (Selective);Packing (Saline gauze);Hydrotherapy (Pulse lavage);Tape changed;Packing (Plain strip) 03/27/2018 12:34 PM     Wound / Incision (Open or Dehisced) 03/23/18 Incision - Open Lumbar Lower (Active)  Dressing Type ABD;Moist to dry;Barrier Film (skin  prep);Gauze (Comment) 03/27/2018 12:34 PM  Dressing Changed Changed 03/27/2018 12:34 PM  Dressing Status New drainage 03/27/2018 12:34 PM  Dressing Change Frequency Daily 03/27/2018 12:34 PM  Site / Wound Assessment Pink;Yellow 03/27/2018 12:34 PM  % Wound base Red or Granulating 30% 03/27/2018 12:34 PM  % Wound base Yellow/Fibrinous Exudate 30% 03/27/2018 12:34 PM  % Wound base Black/Eschar 0% 03/26/2018  1:42 PM  % Wound base Other/Granulation Tissue (Comment) 40% 03/27/2018 12:34 PM  Peri-wound Assessment Intact 03/27/2018 12:34 PM  Wound Length (cm) 3.3 cm 03/23/2018  1:00 PM  Wound Width (cm) 1.8 cm 03/23/2018  1:00 PM  Wound Depth (cm) 1.5 cm 03/23/2018  1:00 PM  Wound Volume (cm^3) 8.91 cm^3 03/23/2018  1:00 PM  Wound Surface Area (cm^2) 5.94 cm^2 03/23/2018  1:00 PM  Undermining (cm) 2.8cm 12 o'clock; 3.0cm 3 o'clock; 2.2cm 3-6 o'clock;  4.5cm 9-10 o'clock; 4.8cm 11 o'clock 03/23/2018  1:00 PM  Margins Unattached edges (unapproximated) 03/27/2018 12:34 PM  Closure None 03/27/2018 12:34 PM  Drainage Amount Moderate 03/27/2018 12:34 PM  Drainage Description Purulent 03/27/2018 12:34 PM  Treatment Cleansed;Debridement (Selective);Tape changed 03/27/2018 12:34 PM      Hydrotherapy Pulsed lavage therapy - wound location: x2 lumbar wounds Pulsed Lavage with Suction (psi): 12 psi Pulsed Lavage with Suction - Normal Saline Used: 1000 mL Pulsed Lavage Tip: Tip with splash shield Selective Debridement Selective Debridement - Location: x2 lumbar wounds Selective Debridement - Tools Used: Forceps;Scissors Selective Debridement - Tissue Removed: yellow necrotic/unviable tissue   Wound Assessment and Plan  Wound Therapy - Assess/Plan/Recommendations Wound Therapy - Clinical Statement: Pt remains to drain purulent drainage.  Packing not removed by RN because patient told RN to wait  until hydrotherapy and just replace outer pad.  Informed nursing that patient is to be TID with wound dressing changes.  Also  added a pink foam border to sacrum as he is noticeably red but no visible breakdown observed.  Pt continues to benefit from hydrotherapy to reduce bioburden and promote wound healing.   Wound Therapy - Functional Problem List: Acute pain, decreased tolerance for position changes Factors Delaying/Impairing Wound Healing: Diabetes Mellitus;Infection - systemic/local Hydrotherapy Plan: Debridement;Dressing change;Patient/family education;Pulsatile lavage with suction Wound Therapy - Frequency: 6X / week Wound Therapy - Follow Up Recommendations: Home health RN Wound Plan: See above  Wound Therapy Goals- Improve the function of patient's integumentary system by progressing the wound(s) through the phases of wound healing (inflammation - proliferation - remodeling) by: Decrease Necrotic Tissue to: 0% both wounds Decrease Necrotic Tissue - Progress: Progressing toward goal Increase Granulation Tissue to: 100% both wounds Improve Drainage Characteristics: Min;Serous Improve Drainage Characteristics - Progress: Progressing toward goal Goals/treatment plan/discharge plan were made with and agreed upon by patient/family: Yes Time For Goal Achievement: 7 days Wound Therapy - Potential for Goals: Good  Goals will be updated until maximal potential achieved or discharge criteria met.  Discharge criteria: when goals achieved, discharge from hospital, MD decision/surgical intervention, no progress towards goals, refusal/missing three consecutive treatments without notification or medical reason.  GP     Armonte Tortorella Eli Hose 03/27/2018, 12:54 PM Governor Rooks, PTA Acute Rehabilitation Services Pager [phone] Office [phone]

## 2018-03-27 NOTE — Progress Notes (Signed)
PROGRESS NOTE        PATIENT DETAILS Name: Tony Long Age: 42 y.o. Sex: male Date of Birth: 10-27-1975 Admit Date: 03/20/2018 Admitting Physician Kendell Bane, MD ZOX:[id number], Rolm Gala, FNP  Brief Narrative: [name] a 42 y.o. male history of IVDA, DM-2, hypertension admitted for worsening back pain-further evaluation revealed MSSA bacteremia with tricuspid valve endocarditis, epidural abscess involving C2 through sacrum, and numerous soft tissue back abscesses.  Evaluated by general surgery-underwent I&D of her lower back soft tissue abscess, evaluated by neurosurgery-not felt to be a candidate for decompressive surgery due to extensive nature of the disease and lack of any significant neurological findings.  ID following with plans to continue Ancef with stop date of 05/25/2018.  See below for further details  Subjective:  Patient in bed, appears comfortable, denies any headache, no fever, no chest pain or pressure, no shortness of breath , no abdominal pain. No focal weakness.   Assessment/Plan:  MSSA bacteremia with tricuspid valve endocarditis, extensive epidural abscess (C2 through sacrum) and large soft tissue lower back abscess: Remains stable-due to extensive nature of the epidural abscess and lack of concerning neurological findings-neurosurgery does not recommend decompressive surgery.  ID recommending continuing IV antibiotics with stop date of 11/26.  Given history of IVDA-not a candidate for outpatient IV antimicrobial therapy.  He has received a PICC line, continue IV antibiotics through PEG.   If patient cannot be placed to SNF-he will likely remain inpatient.  Patient aware of the extensive nature of this infection-with life-threatening and life disabling risks including quadriplegia along with life-threatening sepsis with this present infection which is nonsurgical.  This was explained by me to the patient several times along with his  parents bedside on 03/22/2018 and 03/23/2018.  Hypertension: BP controlled-continue metoprolol, clonidine started for drug withdrawal symptoms-being slowly tapered down-change to twice daily dosing.   Constipation: Had bowel movement overnight-probably secondary to narcotic use-have placed on scheduled MiraLAX and senna.  Acute urinary retention: Required Foley catheter placement on 03/26/2018 along with Flomax, continue for now.  Cocaine/Subutex abuse: Withdrawal symptoms much better-on 9/25 patient developed excessive sedation-Klonopin/Flexeril has been changed to as needed dosing-clonidine being tapered down.  He still has significant pain and requires narcotics.  Once his pain issues are better controlled-we can think of initiating Suboxone at some point.    Polysubstance abuse/IVDA: Counseled to quit.   DM-2: Poor outpatient control, continue Lantus and sliding scale.     CBG (last 3)  Recent Labs    03/26/18 1754 03/26/18 2222 03/27/18 0827  GLUCAP 133* 254* 93   Lab Results  Component Value Date   HGBA1C 12.6 (A) 02/26/2018     DVT Prophylaxis: Prophylactic heparin  Code Status: Full code  Family Communication: Mother at bedside  Disposition Plan: Remain inpatient-will be inpatient until 11/26-if SNF placement not possible.    Antimicrobial agents: Anti-infectives (From admission, onward)   Start     Dose/Rate Route Frequency Ordered Stop   03/21/18 0930  ceFAZolin (ANCEF) IVPB 2g/100 mL premix     2 g 200 mL/hr over 30 Minutes Intravenous Every 8 hours 03/21/18 0920        Procedures:   TEE - - Left ventricle: The cavity size was normal. Wall thickness wasnormal. Systolic function was normal. The estimated ejectionfraction was in the range of 60% to 65%. - Aortic  valve: No evidence of vegetation. - Mitral valve: No evidence of vegetation. - Left atrium: No evidence of thrombus in the atrial cavity or appendage. No evidence of thrombus in the  appendage. - Right atrium: No evidence of thrombus in the atrial cavity orappendage. - Tricuspid valve: There was a vegetation. There was a small (1.2cmx .6cm), mobile vegetation on the septal leaflet. There was mildregurgitation. - Pulmonic valve: No evidence of vegetation.  Impressions:   Tricuspid valve endocarditis. Discussed with primary team   MRI C-T-L Spine - 1. Epidural abscess from C2 to sacrum as described. Maximal cord compression from T3-4 to T5-6. The thecal sac is completely effaced from L1 to L4-5. At the L3-4 interspinous space the spinal abscess communicates with large bilateral abscesses within the intrinsic back muscles. There is osteomyelitis of the L2, L3, and L4 spinous processes. No discitis or facet arthritis. 2. Left paravertebral abscess along the lower thoracic spine with small left empyema that is new from CT 2 days ago. 3. Distended bladder  CT - 1. Evidence of cellulitis involving the right side of the base of the neck extending into the right supraclavicular region with fluid and gas in the soft tissues at the base of the right side of the neck. 2. No significant abnormality of the thoracic spine. 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  CT -  1. Extensive abnormal fluid collection in the subcutaneous fat of the midline of the back with underlying marked abnormality of the posterior paraspinal musculature from L1-2 through S3. This is worrisome for subcutaneous abscess and myositis. 2. Moth-eaten appearance of the spinous processes of L3 and L4 consistent with osteomyelitis. 3. No discrete epidural abscess. However, the abnormal edema in the paraspinal musculature extends to the posterior aspect of the spinal canal at L3-4. 4. MRI with and without contrast may better define the extent of the soft tissue and infection and could detect epidural extension that is not apparent on this unenhanced CT scan.  9/22>> PICC line  I&D by [facility] of back soft tissue abscess on  -  03/22/18    CONSULTS:  ID, general surgery and Neurosurgery  Time spent: 25- minutes-Greater than 50% of this time was spent in counseling, explanation of diagnosis, planning of further management, and coordination of care.  MEDICATIONS: Scheduled Meds: . cloNIDine  0.1 mg Oral BID  . gabapentin  300 mg Oral TID  . heparin injection (subcutaneous)  5,000 Units Subcutaneous Q8H  . insulin aspart  0-9 Units Subcutaneous TID WC  . insulin glargine  25 Units Subcutaneous QHS  . metoprolol tartrate  50 mg Oral BID  . nicotine  21 mg Transdermal Daily  . polyethylene glycol  17 g Oral BID  . senna-docusate  2 tablet Oral QHS  . sodium chloride flush  10-40 mL Intracatheter Q12H  . tamsulosin  0.4 mg Oral Daily   Continuous Infusions: .  ceFAZolin (ANCEF) IV 2 g (03/27/18 0506)   PRN Meds:.bisacodyl, clonazepam, cyclobenzaprine, hydrALAZINE, magnesium hydroxide, metoprolol tartrate, oxyCODONE-acetaminophen, sodium phosphate   PHYSICAL EXAM: Vital signs: Vitals:   03/26/18 0617 03/26/18 1304 03/26/18 2148 03/27/18 0455  BP: 129/83 108/63 112/75 (!) 103/55  Pulse: 90 85 (!) 105 89  Resp: 19 18 18 18   Temp: 98.1 F (36.7 C) 99 F (37.2 C) 98.8 F (37.1 C) 99.5 F (37.5 C)  TempSrc: Oral Oral Oral Oral  SpO2: 99% 97% 95% 97%  Weight:      Height:       Filed  Weights   03/21/18 0602  Weight: 68 kg   Body mass index is 20.92 kg/m.   Awake Alert, Oriented X 3, No new F.N deficits, Normal affect [place].AT,PERRAL Supple Neck,No JVD, No cervical lymphadenopathy appriciated.  Symmetrical Chest wall movement, Good air movement bilaterally, CTAB RRR,No Gallops, Rubs or new Murmurs, No Parasternal Heave +ve B.Sounds, Abd Soft, No tenderness, No organomegaly appriciated, No rebound - guarding or rigidity. No Cyanosis, Clubbing or edema, No new Rash or bruise Foley catheter in place  I have personally reviewed following labs and imaging studies  LABORATORY DATA: CBC: Recent  Labs  Lab 03/21/18 1337 03/22/18 0339 03/23/18 0439 03/24/18 0434 03/26/18 0640  WBC 26.3* 25.3* 21.2* 16.7* 15.4*  HGB 10.3* 11.2* 11.0* 11.4* 10.2*  HCT 34.8* 35.3* 34.9* 35.7* 31.4*  MCV 103.6* 97.0 96.4 97.3 95.2  PLT 374 386 311 293 289    Basic Metabolic Panel: Recent Labs  Lab 03/21/18 1338 03/21/18 2228 03/22/18 0339 03/23/18 0439 03/24/18 0434 03/26/18 0640  NA 136 136 141 138 140 138  K 3.4* 2.8* 2.9* 3.1* 2.9* 3.5  CL 102 106 107 106 104 103  CO2 9* 18* 21* 22 24 22   GLUCOSE 348* 143* 127* 281* 174* 319*  BUN 14 11 9 18 17 15   CREATININE 1.53* 0.93 0.81 0.73 0.64 1.08  CALCIUM 9.3 9.3 9.4 9.0 9.3 8.9  MG 1.6*  --  1.7 1.8 1.9  --     GFR: Estimated Creatinine Clearance: 86.6 mL/min (by C-G formula based on SCr of 1.08 mg/dL).  Liver Function Tests: Recent Labs  Lab 03/22/18 0339 03/23/18 0439 03/24/18 0434  AST 9* 9* 17  ALT 8 7  BLOOD 9  ALKPHOS 154* 126 121  BILITOT 0.8 0.5 0.5  PROT 7.0 6.4* 6.6  ALBUMIN 2.0* 1.8* 1.9*   No results for input(s): LIPASE, AMYLASE in the last 168 hours. No results for input(s): AMMONIA in the last 168 hours.  Coagulation Profile: Recent Labs  Lab 03/21/18 1337  INR 1.40    Cardiac Enzymes: No results for input(s): CKTOTAL, CKMB, CKMBINDEX, TROPONINI in the last 168 hours.  BNP (last 3 results) No results for input(s): PROBNP in the last 8760 hours.  HbA1C: No results for input(s): HGBA1C in the last 72 hours.  CBG: Recent Labs  Lab 03/26/18 0833 03/26/18 1147 03/26/18 1754 03/26/18 2222 03/27/18 0827  GLUCAP 280* 312* 133* 254* 93    Lipid Profile: No results for input(s): CHOL, HDL, LDLCALC, TRIG, CHOLHDL, LDLDIRECT in the last 72 hours.  Thyroid Function Tests: No results for input(s): TSH, T4TOTAL, FREET4, T3FREE, THYROIDAB in the last 72 hours.  Anemia Panel: No results for input(s): VITAMINB12, FOLATE, FERRITIN, TIBC, IRON, RETICCTPCT in the last 72 hours.  Urine analysis:      Component Value Date/Time   COLORURINE YELLOW (A) 03/20/2018 1622   APPEARANCEUR CLEAR (A) 03/20/2018 1622   LABSPEC 1.015 03/20/2018 1622   PHURINE 5.0 03/20/2018 1622   GLUCOSEU >500 (A) 03/20/2018 1622   HGBUR SMALL (A) 03/20/2018 1622   BILIRUBINUR NEGATIVE 03/20/2018 1622   BILIRUBINUR small 02/26/2018 1202   KETONESUR 80 (A) 03/20/2018 1622   PROTEINUR NEGATIVE 03/20/2018 1622   UROBILINOGEN 1.0 02/26/2018 1202   UROBILINOGEN 1.0 12/11/2016 1020   NITRITE NEGATIVE 03/20/2018 1622   LEUKOCYTESUR NEGATIVE 03/20/2018 1622    Sepsis Labs: Lactic Acid, Venous    Component Value Date/Time   LATICACIDVEN 1.4 03/20/2018 1235    MICROBIOLOGY: Recent Results (from the past  240 hour(s))  Blood Culture (routine x 2)     Status: Abnormal   Collection Time: 03/20/18  7:59 AM  Result Value Ref Range Status   Specimen Description   Final    BLOOD EJ Performed at [facility], [address], [place], [place] [address]    Special Requests   Final    BOTTLES DRAWN AEROBIC AND ANAEROBIC Blood Culture adequate volume Performed at [facility], [address] [place], [place] [address]    Culture  Setup Time   Final    GRAM POSITIVE COCCI IN BOTH AEROBIC AND ANAEROBIC BOTTLES CRITICAL RESULT CALLED TO, READ BACK BY AND VERIFIED WITH: MATT MCBANE ON 03/21/18 AT 0001 QSD Performed at [facility], [address] [place], [place] [address]    Culture (A)  Final    STAPHYLOCOCCUS AUREUS SUSCEPTIBILITIES PERFORMED ON PREVIOUS CULTURE WITHIN THE LAST 5 DAYS. Performed at [facility], [address] [place], [place] [address]    Report Status 03/23/2018 FINAL  Final  Blood Culture (routine x 2)     Status: Abnormal   Collection Time: 03/20/18  7:59 AM  Result Value Ref Range Status   Specimen Description   Final    BLOOD EJ Performed at [facility], [address], [place], [place] [address]    Special Requests   Final    BOTTLES  DRAWN AEROBIC AND ANAEROBIC Blood Culture adequate volume Performed at [facility], [address] [place], [place] [address]    Culture  Setup Time   Final    GRAM POSITIVE COCCI IN BOTH AEROBIC AND ANAEROBIC BOTTLES CRITICAL RESULT CALLED TO, READ BACK BY AND VERIFIED WITH: MATT MCBANE ON 03/21/18 AT 0001 QSD Performed at [facility], [address] [place], [place] [address]    Culture STAPHYLOCOCCUS AUREUS (A)  Final   Report Status 03/23/2018 FINAL  Final   Organism ID, Bacteria STAPHYLOCOCCUS AUREUS  Final      Susceptibility   Staphylococcus aureus - MIC*    CIPROFLOXACIN <=0.5 SENSITIVE Sensitive     ERYTHROMYCIN <=0.25 SENSITIVE Sensitive     GENTAMICIN <=0.5 SENSITIVE Sensitive     OXACILLIN <=0.25 SENSITIVE Sensitive     TETRACYCLINE <=1 SENSITIVE Sensitive     VANCOMYCIN <=0.5 SENSITIVE Sensitive     TRIMETH/SULFA <=10 SENSITIVE Sensitive     CLINDAMYCIN <=0.25 SENSITIVE Sensitive     RIFAMPIN <=0.5 SENSITIVE Sensitive     Inducible Clindamycin NEGATIVE Sensitive     * STAPHYLOCOCCUS AUREUS  Blood Culture ID Panel (Reflexed)     Status: Abnormal   Collection Time: 03/20/18  7:59 AM  Result Value Ref Range Status   Enterococcus species NOT DETECTED NOT DETECTED Final   Listeria monocytogenes NOT DETECTED NOT DETECTED Final   Staphylococcus species DETECTED (A) NOT DETECTED Final    Comment: CRITICAL RESULT CALLED TO, READ BACK BY AND VERIFIED WITH: MATT MCBANE ON 03/21/18 AT 0001 QSD    Staphylococcus aureus DETECTED (A) NOT DETECTED Final    Comment: Methicillin (oxacillin) susceptible Staphylococcus aureus (MSSA). Preferred therapy is anti staphylococcal beta lactam antibiotic (Cefazolin or Nafcillin), unless clinically contraindicated. CRITICAL RESULT CALLED TO, READ BACK BY AND VERIFIED WITH: MATT MCBANE ON 03/21/18 AT 0001 QSD    Methicillin resistance NOT DETECTED NOT DETECTED Final   Streptococcus species NOT DETECTED NOT DETECTED Final    Streptococcus agalactiae NOT DETECTED NOT DETECTED Final   Streptococcus pneumoniae NOT DETECTED NOT DETECTED  Final   Streptococcus pyogenes NOT DETECTED NOT DETECTED Final   Acinetobacter baumannii NOT DETECTED NOT DETECTED Final   Enterobacteriaceae species NOT DETECTED NOT DETECTED Final   Enterobacter cloacae complex NOT DETECTED NOT DETECTED Final   Escherichia coli NOT DETECTED NOT DETECTED Final   Klebsiella oxytoca NOT DETECTED NOT DETECTED Final   Klebsiella pneumoniae NOT DETECTED NOT DETECTED Final   Proteus species NOT DETECTED NOT DETECTED Final   Serratia marcescens NOT DETECTED NOT DETECTED Final   Haemophilus influenzae NOT DETECTED NOT DETECTED Final   Neisseria meningitidis NOT DETECTED NOT DETECTED Final   Pseudomonas aeruginosa NOT DETECTED NOT DETECTED Final   Candida albicans NOT DETECTED NOT DETECTED Final   Candida glabrata NOT DETECTED NOT DETECTED Final   Candida krusei NOT DETECTED NOT DETECTED Final   Candida parapsilosis NOT DETECTED NOT DETECTED Final   Candida tropicalis NOT DETECTED NOT DETECTED Final    Comment: Performed at [facility], [address] [place], [place] [address]  MRSA PCR Screening     Status: None   Collection Time: 03/20/18  3:50 PM  Result Value Ref Range Status   MRSA by PCR NEGATIVE NEGATIVE Final    Comment:        The GeneXpert MRSA Assay (FDA approved for NASAL specimens only), is one component of a comprehensive MRSA colonization surveillance program. It is not intended to diagnose MRSA infection nor to guide or monitor treatment for MRSA infections. Performed at [facility], [address] [place], [place] [address]   Anaerobic culture     Status: None   Collection Time: 03/22/18 10:36 AM  Result Value Ref Range Status   Specimen Description ABSCESS BACK  Final   Special Requests NONE  Final   Gram Stain   Final    RARE WBC PRESENT, PREDOMINANTLY MONONUCLEAR ABUNDANT GRAM POSITIVE COCCI     Culture   Final    NO ANAEROBES ISOLATED Performed at [facility], [address] [place], [place] [address]    Report Status 03/27/2018 FINAL  Final  Aerobic Culture (superficial specimen)     Status: None   Collection Time: 03/22/18 10:36 AM  Result Value Ref Range Status   Specimen Description ABSCESS BACK  Final   Special Requests NONE  Final   Gram Stain   Final    FEW WBC PRESENT,BOTH PMN AND MONONUCLEAR MODERATE GRAM POSITIVE COCCI Performed at [facility], [address] [place], [place] [address]    Culture ABUNDANT STAPHYLOCOCCUS AUREUS  Final   Report Status 03/24/2018 FINAL  Final   Organism ID, Bacteria STAPHYLOCOCCUS AUREUS  Final      Susceptibility   Staphylococcus aureus - MIC*    CIPROFLOXACIN <=0.5 SENSITIVE Sensitive     ERYTHROMYCIN <=0.25 SENSITIVE Sensitive     GENTAMICIN <=0.5 SENSITIVE Sensitive     OXACILLIN 0.5 SENSITIVE Sensitive     TETRACYCLINE <=1 SENSITIVE Sensitive     VANCOMYCIN 1 SENSITIVE Sensitive     TRIMETH/SULFA <=10 SENSITIVE Sensitive     CLINDAMYCIN <=0.25 SENSITIVE Sensitive     RIFAMPIN <=0.5 SENSITIVE Sensitive     Inducible Clindamycin NEGATIVE Sensitive     * ABUNDANT STAPHYLOCOCCUS AUREUS  Culture, blood (Routine X 2) w Reflex to ID Panel     Status: None (Preliminary result)   Collection Time: 03/23/18  4:39 AM  Result Value Ref Range Status   Specimen Description BLOOD RIGHT HAND  Final   Special Requests  Final    BOTTLES DRAWN AEROBIC ONLY Blood Culture adequate volume   Culture   Final    NO GROWTH 4 DAYS Performed at [name], [address], [place], [place] [address]    Report Status PENDING  Incomplete    RADIOLOGY STUDIES/RESULTS: Ct Thoracic Spine Wo Contrast  Result Date: 03/20/2018 CLINICAL DATA:  Progressive back pain.  Swelling of the lower back. EXAM: CT THORACIC SPINE WITHOUT CONTRAST TECHNIQUE: Multidetector CT images of the thoracic were obtained using the standard protocol  without intravenous contrast. COMPARISON:  None. FINDINGS: Alignment: Normal. 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 Paraspinal and other soft tissues: There is abnormal gas and fluid in the soft tissues of the right side of the base of the neck and in the right supraclavicular region. Paraspinal soft tissues appear normal throughout the thoracic spine. Disc levels: There is no evidence of disc protrusion or significant disc bulging or spinal or foraminal stenosis or other significant abnormality of the thoracic spine. IMPRESSION: 1. Evidence of cellulitis involving the right side of the base of the neck extending into the right supraclavicular region with fluid and gas in the soft tissues at the base of the right side of the neck. 2. No significant abnormality of the thoracic spine. 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 Electronically Signed   By: Francene Boyers M.D.   On: 03/20/2018 11:25   Ct Lumbar Spine Wo Contrast  Addendum Date: 03/20/2018   ADDENDUM REPORT: 03/20/2018 11:44 ADDENDUM: Critical Value/emergent results were called by telephone at the time of interpretation on 03/20/2018 at 11:30 am to Dr. Sharyn Creamer , who verbally acknowledged these results. Electronically Signed   By: Francene Boyers M.D.   On: 03/20/2018 11:44   Result Date: 03/20/2018 CLINICAL DATA:  Increasing low back pain and soft tissue swelling. EXAM: CT LUMBAR SPINE WITHOUT CONTRAST TECHNIQUE: Multidetector CT imaging of the lumbar spine was performed without intravenous contrast administration. Multiplanar CT image reconstructions were also generated. IV contrast could not be utilized due to the lack of an appropriate IV. COMPARISON:  None. FINDINGS: Segmentation: 5 lumbar type vertebrae. Alignment: Normal. Vertebrae: There is a moth-eaten appearance of the spinous processes of L3 and L4 which is worrisome for osteomyelitis. Bilateral pars defects at L5 with grade 1 spondylolisthesis. 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  Paraspinal and other soft tissues: There is an extensive abnormal fluid collection in the subcutaneous soft tissues of the posterior aspect of the back extending from approximately L1-2 to S3. This fluid collection is lobulated and measures approximately 20 x 9 x 2.5 cm. It is centered slightly to the left of midline and has a mass effect upon the adjacent posterior paraspinal muscles. There is abnormal lucency in the underlying paraspinal muscles which could represent myositis. Disc levels: T11-12: No significant abnormality. Butterfly T12 vertebra. T12-L1: No significant abnormality. L1-2: Normal disc. Abnormal edema in the posterior paraspinal musculature with adjacent fluid collection in the subcutaneous fat of the posterior aspect of the back as described above. L2-3: Normal disc. L3-4: Normal disc. Lucency in the posterior paraspinal soft tissues extends to the posterior aspect of the thecal sac on image 80 of series 4 but there is no discrete epidural abscess. L4-5: Normal disc.  No evidence of epidural abscess. L5-S1: Grade 1 spondylolisthesis. No disc bulging or protrusion. Bilateral pars defects. No visible epidural abscess. IMPRESSION: 1. Extensive abnormal fluid collection in the subcutaneous fat of the midline of the back with underlying marked abnormality of the  posterior paraspinal musculature from L1-2 through S3. This is worrisome for subcutaneous abscess and myositis. 2. Moth-eaten appearance of the spinous processes of L3 and L4 consistent with osteomyelitis. 3. No discrete epidural abscess. However, the abnormal edema in the paraspinal musculature extends to the posterior aspect of the spinal canal at L3-4. 4. MRI with and without contrast may better define the extent of the soft tissue and infection and could detect epidural extension that is not apparent on this unenhanced CT scan. Electronically Signed: By: Francene Boyers M.D. On: 03/20/2018 11:18   Mr Cervical Spine W Wo Contrast  Result  Date: 03/22/2018 CLINICAL DATA:  Spine infection. EXAM: MRI TOTAL SPINE WITHOUT AND WITH CONTRAST TECHNIQUE: Multisequence MR imaging of the spine from the cervical spine to the sacrum was performed prior to and following IV contrast administration. CONTRAST:  6 cc Gadavist intravenous COMPARISON:  CT of the thoracic and lumbar spine from 2 days ago FINDINGS: MRI CERVICAL SPINE FINDINGS Alignment: Normal Vertebrae: No evidence of osseous infection. Canal/Cord: There is extensive spinal fluid collection preferentially in the ventral but also in the right more than left dorsal canal. Subarachnoid space is diffusely effaced. Maximal thickness is posterior to C2 at 9 mm. No cord signal abnormality. Posterior Fossa, vertebral arteries, paraspinal tissues: No retropharyngeal or other discrete soft tissue collection. Disc levels: C2-3: Unremarkable. C3-4: Small left foraminal protrusion with moderate narrowing C4-5: Unremarkable. C5-6: Disc narrowing and bulging with asymmetric left uncovertebral spurring. Left foraminal impingement C6-7: Right foraminal protrusion mild narrowing. C7-T1:Unremarkable. MRI THORACIC SPINE FINDINGS Alignment:  Normal Vertebrae: No evidence of osteomyelitis or discitis. T12 butterfly vertebra. Canal/Cord: Cervical ventral epidural collection continues throughout the thoracic levels. There is also a focal dorsal component at T3-4 to T5-6, where thecal sac effacement is accentuated and there is cord flattening. No cord edema. Paraspinal and other soft tissues: Paraspinous phlegmon on the left at T8-T12, with new complex left pleural effusion and lower lobe atelectasis Disc levels: T5-6 central disc protrusion. MRI LUMBAR SPINE FINDINGS Segmentation:  5 lumbar type vertebral bodies Alignment:  Grade 1 anterolisthesis at L5-S1. Vertebrae: Marrow edema and heterogeneous enhancement within the L2, L3, and L4 spinous processes. No discitis or facet edema. Conus medullaris: Extends to the L1 level and  is non edematous. There is extensive epidural collection completely effacing the thecal sac throughout the lumbar spine until L4-5 and below where the collection becomes ventral and right eccentric. The infection communicates with extensive bilateral abscess within the intrinsic back muscles via the interspinous space at L3-4. Patient had recent subcutaneous collection and left buttocks collection drainage, with packing seen in place. This midline, upper subcutaneous collection communicates with the paravertebral abscess along its superior margin based on postcontrast axial images. Paraspinal and other soft tissues: As above.  Distended bladder Disc levels: Chronic bilateral pars defects at L5. Critical Value/emergent results were called by telephone at the time of interpretation on 03/22/2018 at 3:04 pm to Dr. Thedore Mins , who verbally acknowledged these results. IMPRESSION: 1. Epidural abscess from C2 to sacrum as described. Maximal cord compression from T3-4 to T5-6. The thecal sac is completely effaced from L1 to L4-5. At the L3-4 interspinous space the spinal abscess communicates with large bilateral abscesses within the intrinsic back muscles. There is osteomyelitis of the L2, L3, and L4 spinous processes. No discitis or facet arthritis. 2. Left paravertebral abscess along the lower thoracic spine with small left empyema that is new from CT 2 days ago. 3. Distended bladder Electronically Signed  By: Marnee Spring M.D.   On: 03/22/2018 15:11   Mr Thoracic Spine W Wo Contrast  Result Date: 03/22/2018 CLINICAL DATA:  Spine infection. EXAM: MRI TOTAL SPINE WITHOUT AND WITH CONTRAST TECHNIQUE: Multisequence MR imaging of the spine from the cervical spine to the sacrum was performed prior to and following IV contrast administration. CONTRAST:  6 cc Gadavist intravenous COMPARISON:  CT of the thoracic and lumbar spine from 2 days ago FINDINGS: MRI CERVICAL SPINE FINDINGS Alignment: Normal Vertebrae: No evidence of  osseous infection. Canal/Cord: There is extensive spinal fluid collection preferentially in the ventral but also in the right more than left dorsal canal. Subarachnoid space is diffusely effaced. Maximal thickness is posterior to C2 at 9 mm. No cord signal abnormality. Posterior Fossa, vertebral arteries, paraspinal tissues: No retropharyngeal or other discrete soft tissue collection. Disc levels: C2-3: Unremarkable. C3-4: Small left foraminal protrusion with moderate narrowing C4-5: Unremarkable. C5-6: Disc narrowing and bulging with asymmetric left uncovertebral spurring. Left foraminal impingement C6-7: Right foraminal protrusion mild narrowing. C7-T1:Unremarkable. MRI THORACIC SPINE FINDINGS Alignment:  Normal Vertebrae: No evidence of osteomyelitis or discitis. T12 butterfly vertebra. Canal/Cord: Cervical ventral epidural collection continues throughout the thoracic levels. There is also a focal dorsal component at T3-4 to T5-6, where thecal sac effacement is accentuated and there is cord flattening. No cord edema. Paraspinal and other soft tissues: Paraspinous phlegmon on the left at T8-T12, with new complex left pleural effusion and lower lobe atelectasis Disc levels: T5-6 central disc protrusion. MRI LUMBAR SPINE FINDINGS Segmentation:  5 lumbar type vertebral bodies Alignment:  Grade 1 anterolisthesis at L5-S1. Vertebrae: Marrow edema and heterogeneous enhancement within the L2, L3, and L4 spinous processes. No discitis or facet edema. Conus medullaris: Extends to the L1 level and is non edematous. There is extensive epidural collection completely effacing the thecal sac throughout the lumbar spine until L4-5 and below where the collection becomes ventral and right eccentric. The infection communicates with extensive bilateral abscess within the intrinsic back muscles via the interspinous space at L3-4. Patient had recent subcutaneous collection and left buttocks collection drainage, with packing seen in  place. This midline, upper subcutaneous collection communicates with the paravertebral abscess along its superior margin based on postcontrast axial images. Paraspinal and other soft tissues: As above.  Distended bladder Disc levels: Chronic bilateral pars defects at L5. Critical Value/emergent results were called by telephone at the time of interpretation on 03/22/2018 at 3:04 pm to Dr. Thedore Mins , who verbally acknowledged these results. IMPRESSION: 1. Epidural abscess from C2 to sacrum as described. Maximal cord compression from T3-4 to T5-6. The thecal sac is completely effaced from L1 to L4-5. At the L3-4 interspinous space the spinal abscess communicates with large bilateral abscesses within the intrinsic back muscles. There is osteomyelitis of the L2, L3, and L4 spinous processes. No discitis or facet arthritis. 2. Left paravertebral abscess along the lower thoracic spine with small left empyema that is new from CT 2 days ago. 3. Distended bladder Electronically Signed   By: Marnee Spring M.D.   On: 03/22/2018 15:11   Mr Lumbar Spine W Wo Contrast  Result Date: 03/22/2018 CLINICAL DATA:  Spine infection. EXAM: MRI TOTAL SPINE WITHOUT AND WITH CONTRAST TECHNIQUE: Multisequence MR imaging of the spine from the cervical spine to the sacrum was performed prior to and following IV contrast administration. CONTRAST:  6 cc Gadavist intravenous COMPARISON:  CT of the thoracic and lumbar spine from 2 days ago FINDINGS: MRI CERVICAL SPINE FINDINGS  Alignment: Normal Vertebrae: No evidence of osseous infection. Canal/Cord: There is extensive spinal fluid collection preferentially in the ventral but also in the right more than left dorsal canal. Subarachnoid space is diffusely effaced. Maximal thickness is posterior to C2 at 9 mm. No cord signal abnormality. Posterior Fossa, vertebral arteries, paraspinal tissues: No retropharyngeal or other discrete soft tissue collection. Disc levels: C2-3: Unremarkable. C3-4: Small  left foraminal protrusion with moderate narrowing C4-5: Unremarkable. C5-6: Disc narrowing and bulging with asymmetric left uncovertebral spurring. Left foraminal impingement C6-7: Right foraminal protrusion mild narrowing. C7-T1:Unremarkable. MRI THORACIC SPINE FINDINGS Alignment:  Normal Vertebrae: No evidence of osteomyelitis or discitis. T12 butterfly vertebra. Canal/Cord: Cervical ventral epidural collection continues throughout the thoracic levels. There is also a focal dorsal component at T3-4 to T5-6, where thecal sac effacement is accentuated and there is cord flattening. No cord edema. Paraspinal and other soft tissues: Paraspinous phlegmon on the left at T8-T12, with new complex left pleural effusion and lower lobe atelectasis Disc levels: T5-6 central disc protrusion. MRI LUMBAR SPINE FINDINGS Segmentation:  5 lumbar type vertebral bodies Alignment:  Grade 1 anterolisthesis at L5-S1. Vertebrae: Marrow edema and heterogeneous enhancement within the L2, L3, and L4 spinous processes. No discitis or facet edema. Conus medullaris: Extends to the L1 level and is non edematous. There is extensive epidural collection completely effacing the thecal sac throughout the lumbar spine until L4-5 and below where the collection becomes ventral and right eccentric. The infection communicates with extensive bilateral abscess within the intrinsic back muscles via the interspinous space at L3-4. Patient had recent subcutaneous collection and left buttocks collection drainage, with packing seen in place. This midline, upper subcutaneous collection communicates with the paravertebral abscess along its superior margin based on postcontrast axial images. Paraspinal and other soft tissues: As above.  Distended bladder Disc levels: Chronic bilateral pars defects at L5. Critical Value/emergent results were called by telephone at the time of interpretation on 03/22/2018 at 3:04 pm to Dr. Thedore Mins , who verbally acknowledged these  results. IMPRESSION: 1. Epidural abscess from C2 to sacrum as described. Maximal cord compression from T3-4 to T5-6. The thecal sac is completely effaced from L1 to L4-5. At the L3-4 interspinous space the spinal abscess communicates with large bilateral abscesses within the intrinsic back muscles. There is osteomyelitis of the L2, L3, and L4 spinous processes. No discitis or facet arthritis. 2. Left paravertebral abscess along the lower thoracic spine with small left empyema that is new from CT 2 days ago. 3. Distended bladder Electronically Signed   By: Marnee Spring M.D.   On: 03/22/2018 15:11   [place] Ekg Site Rite  Result Date: 03/21/2018 If Site Rite image not attached, placement could not be confirmed due to current cardiac rhythm.    LOS: 7 days   Signature  Susa Raring M.D on 03/27/2018 at 12:15 PM  To page go to [email or web address] - password [email or web address]

## 2018-03-27 NOTE — Progress Notes (Signed)
Neurosurgery Progress Note  No issues overnight.  Endorses intermittent LBP but overall manageable Denies focal deficits, N/T  EXAM:  BP (!) 103/55 (BP Location: Right Arm)   Pulse 89   Temp 99.5 F (37.5 C) (Oral)   Resp 18   Ht 5\' 11"  (1.803 m)   Wt 68 kg   SpO2 97%   BMI 20.92 kg/m   Awake, alert, oriented  Speech fluent, appropriate  CN grossly intact  5/5 BUE/BLE   PLAN Remains neurologically intact. No indicated for NS intervention. continue medical care per primary team

## 2018-03-27 NOTE — Progress Notes (Signed)
Patient ID: Tony Long, male   DOB: 09/01/1975, 42 y.o.   MRN: [id number]  Dressing change to mid and lower back. Serosanguinous packing removed from both wounds without discomfort.  Mid upper wound bed pink. Mid lower wound pink/yellow. Both wounds packed with moistened fingertip kerlex gauze with minor discomfort. Covered with ABD and secured with tape.  Midge Aver MSN RN [facility]

## 2018-03-28 LAB — GLUCOSE, CAPILLARY
Glucose-Capillary: 224 mg/dL — ABNORMAL HIGH (ref 70–99)
Glucose-Capillary: 178 mg/dL — ABNORMAL HIGH (ref 70–99)
Glucose-Capillary: 207 mg/dL — ABNORMAL HIGH (ref 70–99)
Glucose-Capillary: 349 mg/dL — ABNORMAL HIGH (ref 70–99)

## 2018-03-28 LAB — CULTURE, BLOOD (ROUTINE X 2)
CULTURE: NO GROWTH
SPECIAL REQUESTS: ADEQUATE

## 2018-03-28 MED ORDER — BUPRENORPHINE HCL-NALOXONE HCL 8-2 MG SL SUBL
1.0000 | SUBLINGUAL_TABLET | Freq: Every day | SUBLINGUAL | Status: DC
  Administered 2018-03-28 – 2018-03-30 (×3): 1 via SUBLINGUAL
  Filled 2018-03-28 (×3): qty 1

## 2018-03-28 MED ORDER — INSULIN ASPART 100 UNIT/ML ~~LOC~~ SOLN
0.0000 [IU] | Freq: Every day | SUBCUTANEOUS | Status: DC
  Administered 2018-03-28: 4 [IU] via SUBCUTANEOUS
  Administered 2018-03-29: 5 [IU] via SUBCUTANEOUS
  Administered 2018-03-30: 4 [IU] via SUBCUTANEOUS
  Administered 2018-03-31: 3 [IU] via SUBCUTANEOUS
  Administered 2018-04-02: 4 [IU] via SUBCUTANEOUS
  Administered 2018-04-03: 3 [IU] via SUBCUTANEOUS
  Administered 2018-04-04 – 2018-04-07 (×4): 2 [IU] via SUBCUTANEOUS
  Administered 2018-04-08: 5 [IU] via SUBCUTANEOUS
  Administered 2018-04-09 – 2018-04-11 (×2): 4 [IU] via SUBCUTANEOUS
  Administered 2018-04-11: 3 [IU] via SUBCUTANEOUS
  Administered 2018-04-12 – 2018-04-13 (×2): 2 [IU] via SUBCUTANEOUS
  Administered 2018-04-14: 3 [IU] via SUBCUTANEOUS
  Administered 2018-04-15: 2 [IU] via SUBCUTANEOUS

## 2018-03-28 MED ORDER — INSULIN ASPART 100 UNIT/ML ~~LOC~~ SOLN
0.0000 [IU] | Freq: Three times a day (TID) | SUBCUTANEOUS | Status: DC
  Administered 2018-03-28 (×2): 3 [IU] via SUBCUTANEOUS
  Administered 2018-03-28: 2 [IU] via SUBCUTANEOUS
  Administered 2018-03-29: 3 [IU] via SUBCUTANEOUS
  Administered 2018-03-29: 2 [IU] via SUBCUTANEOUS
  Administered 2018-03-29: 5 [IU] via SUBCUTANEOUS
  Administered 2018-03-30: 1 [IU] via SUBCUTANEOUS
  Administered 2018-03-30 (×2): 2 [IU] via SUBCUTANEOUS
  Administered 2018-03-31: 7 [IU] via SUBCUTANEOUS
  Administered 2018-03-31 – 2018-04-01 (×3): 5 [IU] via SUBCUTANEOUS
  Administered 2018-04-01: 7 [IU] via SUBCUTANEOUS
  Administered 2018-04-01: 3 [IU] via SUBCUTANEOUS
  Administered 2018-04-02: 2 [IU] via SUBCUTANEOUS
  Administered 2018-04-02: 7 [IU] via SUBCUTANEOUS
  Administered 2018-04-02: 5 [IU] via SUBCUTANEOUS
  Administered 2018-04-03: 7 [IU] via SUBCUTANEOUS
  Administered 2018-04-03 (×2): 9 [IU] via SUBCUTANEOUS
  Administered 2018-04-04: 2 [IU] via SUBCUTANEOUS
  Administered 2018-04-04: 1 [IU] via SUBCUTANEOUS
  Administered 2018-04-04: 3 [IU] via SUBCUTANEOUS
  Administered 2018-04-05: 2 [IU] via SUBCUTANEOUS
  Administered 2018-04-05: 3 [IU] via SUBCUTANEOUS
  Administered 2018-04-06 (×2): 1 [IU] via SUBCUTANEOUS
  Administered 2018-04-06: 3 [IU] via SUBCUTANEOUS
  Administered 2018-04-07: 2 [IU] via SUBCUTANEOUS
  Administered 2018-04-07: 3 [IU] via SUBCUTANEOUS
  Administered 2018-04-07 – 2018-04-08 (×2): 2 [IU] via SUBCUTANEOUS
  Administered 2018-04-08: 7 [IU] via SUBCUTANEOUS
  Administered 2018-04-08: 2 [IU] via SUBCUTANEOUS
  Administered 2018-04-09: 9 [IU] via SUBCUTANEOUS
  Administered 2018-04-09: 2 [IU] via SUBCUTANEOUS
  Administered 2018-04-09 – 2018-04-10 (×2): 3 [IU] via SUBCUTANEOUS
  Administered 2018-04-10: 2 [IU] via SUBCUTANEOUS
  Administered 2018-04-10: 3 [IU] via SUBCUTANEOUS
  Administered 2018-04-11: 1 [IU] via SUBCUTANEOUS
  Administered 2018-04-11: 5 [IU] via SUBCUTANEOUS
  Administered 2018-04-12 (×2): 1 [IU] via SUBCUTANEOUS
  Administered 2018-04-13: 2 [IU] via SUBCUTANEOUS
  Administered 2018-04-13: 5 [IU] via SUBCUTANEOUS
  Administered 2018-04-14: 3 [IU] via SUBCUTANEOUS
  Administered 2018-04-15: 2 [IU] via SUBCUTANEOUS
  Administered 2018-04-15 (×2): 1 [IU] via SUBCUTANEOUS
  Administered 2018-04-16: 2 [IU] via SUBCUTANEOUS
  Administered 2018-04-16: 3 [IU] via SUBCUTANEOUS
  Administered 2018-04-16: 2 [IU] via SUBCUTANEOUS
  Administered 2018-04-17: 7 [IU] via SUBCUTANEOUS

## 2018-03-28 NOTE — Progress Notes (Signed)
Fluctuating lumps palpated to the right and up from top incision, and below the bottom incision.  When pressure was applied to these areas, yellow/brown, creamy puralent fluid was pressed from wounds. Periwound is red approx 0.3 cm around each wound

## 2018-03-28 NOTE — Progress Notes (Addendum)
PROGRESS NOTE        PATIENT DETAILS Name: Tony Long Age: 42 y.o. Sex: male Date of Birth: [birth year] Admit Date: 03/20/2018 Admitting Physician Kendell Bane, MD WUJ:[id number], Rolm Gala, FNP  Brief Narrative: [name] a 42 y.o. male history of IVDA, DM-2, hypertension admitted for worsening back pain-further evaluation revealed MSSA bacteremia with tricuspid valve endocarditis, epidural abscess involving C2 through sacrum, and numerous soft tissue back abscesses.  Evaluated by general surgery-underwent I&D of her lower back soft tissue abscess, evaluated by neurosurgery-not felt to be a candidate for decompressive surgery due to extensive nature of the disease and lack of any significant neurological findings.  ID following with plans to continue Ancef with stop date of 05/25/2018.  See below for further details  Subjective:  Patient in bed, appears comfortable, denies any headache, no fever, no chest pain or pressure, no shortness of breath , no abdominal pain. No focal weakness.  Assessment/Plan:  MSSA bacteremia with tricuspid valve endocarditis, extensive epidural abscess (C2 through sacrum) and large soft tissue lower back abscess: Remains stable-due to extensive nature of the epidural abscess and lack of concerning neurological findings-neurosurgery does not recommend decompressive surgery.  ID recommending continuing IV antibiotics with stop date of 11/26.  Given history of IVDA-not a candidate for outpatient IV antimicrobial therapy. He has received a PICC line, continue IV antibiotics through PEG.   If patient cannot be placed to SNF-he will likely remain inpatient.  Patient aware of the extensive nature of this infection-with life-threatening and life disabling risks including quadriplegia along with life-threatening sepsis with this present infection which is nonsurgical.  This was explained by me to the patient several times along with his parents  bedside on 03/22/2018 and 03/23/2018.  Hypertension: BP controlled-continue metoprolol, clonidine started for drug withdrawal symptoms-being slowly tapered down-change to twice daily dosing.   Constipation: Had bowel movement overnight-probably secondary to narcotic use-have placed on scheduled MiraLAX and senna.  Acute urinary retention: Required Foley catheter placement on 03/26/2018 along with Flomax, continue for now.  Cocaine/Subutex abuse: Withdrawal symptoms much better-on 9/25 patient developed excessive sedation-Klonopin/Flexeril has been changed to as needed dosing-clonidine being tapered down.  On 03/28/2018 he requested me that he be put back on his Suboxone, he does not want short-acting pain medications anymore, changes will be made per his request.    Polysubstance abuse/IVDA: Counseled to quit.   DM-2: Poor outpatient control, continue Lantus and sliding scale.     CBG (last 3)  Recent Labs    03/27/18 1730 03/27/18 2140 03/28/18 0828  GLUCAP 243* 292* 224*   Lab Results  Component Value Date   HGBA1C 12.6 (A) 02/26/2018     DVT Prophylaxis: Prophylactic heparin  Code Status: Full code  Family Communication: Mother at bedside  Disposition Plan: Remain inpatient-will be inpatient until 11/26-if SNF placement not possible.    Antimicrobial agents: Anti-infectives (From admission, onward)   Start     Dose/Rate Route Frequency Ordered Stop   03/21/18 0930  ceFAZolin (ANCEF) IVPB 2g/100 mL premix     2 g 200 mL/hr over 30 Minutes Intravenous Every 8 hours 03/21/18 0920        Procedures:   TEE - - Left ventricle: The cavity size was normal. Wall thickness wasnormal. Systolic function was normal. The estimated ejectionfraction was in the range of 60% to 65%. -  Aortic valve: No evidence of vegetation. - Mitral valve: No evidence of vegetation. - Left atrium: No evidence of thrombus in the atrial cavity or appendage. No evidence of thrombus in the  appendage. - Right atrium: No evidence of thrombus in the atrial cavity orappendage. - Tricuspid valve: There was a vegetation. There was a small (1.2cmx .6cm), mobile vegetation on the septal leaflet. There was mildregurgitation. - Pulmonic valve: No evidence of vegetation.  Impressions:   Tricuspid valve endocarditis. Discussed with primary team   MRI C-T-L Spine - 1. Epidural abscess from C2 to sacrum as described. Maximal cord compression from T3-4 to T5-6. The thecal sac is completely effaced from L1 to L4-5. At the L3-4 interspinous space the spinal abscess communicates with large bilateral abscesses within the intrinsic back muscles. There is osteomyelitis of the L2, L3, and L4 spinous processes. No discitis or facet arthritis. 2. Left paravertebral abscess along the lower thoracic spine with small left empyema that is new from CT 2 days ago. 3. Distended bladder  CT - 1. Evidence of cellulitis involving the right side of the base of the neck extending into the right supraclavicular region with fluid and gas in the soft tissues at the base of the right side of the neck. 2. No significant abnormality of the thoracic spine. 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  CT -  1. Extensive abnormal fluid collection in the subcutaneous fat of the midline of the back with underlying marked abnormality of the posterior paraspinal musculature from L1-2 through S3. This is worrisome for subcutaneous abscess and myositis. 2. Moth-eaten appearance of the spinous processes of L3 and L4 consistent with osteomyelitis. 3. No discrete epidural abscess. However, the abnormal edema in the paraspinal musculature extends to the posterior aspect of the spinal canal at L3-4. 4. MRI with and without contrast may better define the extent of the soft tissue and infection and could detect epidural extension that is not apparent on this unenhanced CT scan.  9/22>> PICC line  I&D by [facility] of back soft tissue abscess on  -  03/22/18    CONSULTS:  ID, general surgery and Neurosurgery  Time spent: 25- minutes-Greater than 50% of this time was spent in counseling, explanation of diagnosis, planning of further management, and coordination of care.  MEDICATIONS: Scheduled Meds: . cloNIDine  0.1 mg Oral BID  . gabapentin  300 mg Oral TID  . heparin injection (subcutaneous)  5,000 Units Subcutaneous Q8H  . insulin aspart  0-5 Units Subcutaneous QHS  . insulin aspart  0-9 Units Subcutaneous TID WC  . insulin glargine  25 Units Subcutaneous QHS  . metoprolol tartrate  50 mg Oral BID  . nicotine  21 mg Transdermal Daily  . polyethylene glycol  17 g Oral BID  . senna-docusate  2 tablet Oral QHS  . sodium chloride flush  10-40 mL Intracatheter Q12H  . tamsulosin  0.4 mg Oral Daily   Continuous Infusions: .  ceFAZolin (ANCEF) IV 2 g (03/28/18 0540)   PRN Meds:.bisacodyl, clonazepam, cyclobenzaprine, hydrALAZINE, magnesium hydroxide, metoprolol tartrate, oxyCODONE-acetaminophen, sodium phosphate   PHYSICAL EXAM: Vital signs: Vitals:   03/27/18 0455 03/27/18 1336 03/27/18 2142 03/28/18 0611  BP: (!) 103/55 (!) 111/57 108/72 (!) 120/56  Pulse: 89 87 98 98  Resp: 18 19    Temp: 99.5 F (37.5 C) 99.7 F (37.6 C) 99.4 F (37.4 C) 99.9 F (37.7 C)  TempSrc: Oral Oral Oral Oral  SpO2: 97% 96% 95% 97%  Weight:  Height:       Filed Weights   03/21/18 0602  Weight: 68 kg   Body mass index is 20.92 kg/m.   Awake Alert, Oriented X 3, No new F.N deficits, Normal affect [place].AT,PERRAL Supple Neck,No JVD, No cervical lymphadenopathy appriciated.  Symmetrical Chest wall movement, Good air movement bilaterally, CTAB RRR,No Gallops, Rubs or new Murmurs, No Parasternal Heave +ve B.Sounds, Abd Soft, No tenderness, No organomegaly appriciated, No rebound - guarding or rigidity. No Cyanosis, Clubbing or edema, No new Rash or bruise Foley catheter in place  I have personally reviewed following labs and  imaging studies  LABORATORY DATA: CBC: Recent Labs  Lab 03/21/18 1337 03/22/18 0339 03/23/18 0439 03/24/18 0434 03/26/18 0640  WBC 26.3* 25.3* 21.2* 16.7* 15.4*  HGB 10.3* 11.2* 11.0* 11.4* 10.2*  HCT 34.8* 35.3* 34.9* 35.7* 31.4*  MCV 103.6* 97.0 96.4 97.3 95.2  PLT 374 386 311 293 289    Basic Metabolic Panel: Recent Labs  Lab 03/21/18 1338 03/21/18 2228 03/22/18 0339 03/23/18 0439 03/24/18 0434 03/26/18 0640  NA 136 136 141 138 140 138  K 3.4* 2.8* 2.9* 3.1* 2.9* 3.5  CL 102 106 107 106 104 103  CO2 9* 18* 21* 22 24 22   GLUCOSE 348* 143* 127* 281* 174* 319*  BUN 14 11 9 18 17 15   CREATININE 1.53* 0.93 0.81 0.73 0.64 1.08  CALCIUM 9.3 9.3 9.4 9.0 9.3 8.9  MG 1.6*  --  1.7 1.8 1.9  --     GFR: Estimated Creatinine Clearance: 86.6 mL/min (by C-G formula based on SCr of 1.08 mg/dL).  Liver Function Tests: Recent Labs  Lab 03/22/18 0339 03/23/18 0439 03/24/18 0434  AST 9* 9* 17  ALT 8 7  BLOOD 9  ALKPHOS 154* 126 121  BILITOT 0.8 0.5 0.5  PROT 7.0 6.4* 6.6  ALBUMIN 2.0* 1.8* 1.9*   No results for input(s): LIPASE, AMYLASE in the last 168 hours. No results for input(s): AMMONIA in the last 168 hours.  Coagulation Profile: Recent Labs  Lab 03/21/18 1337  INR 1.40    Cardiac Enzymes: No results for input(s): CKTOTAL, CKMB, CKMBINDEX, TROPONINI in the last 168 hours.  BNP (last 3 results) No results for input(s): PROBNP in the last 8760 hours.  HbA1C: No results for input(s): HGBA1C in the last 72 hours.  CBG: Recent Labs  Lab 03/27/18 0827 03/27/18 1249 03/27/18 1730 03/27/18 2140 03/28/18 0828  GLUCAP 93 127* 243* 292* 224*    Lipid Profile: No results for input(s): CHOL, HDL, LDLCALC, TRIG, CHOLHDL, LDLDIRECT in the last 72 hours.  Thyroid Function Tests: No results for input(s): TSH, T4TOTAL, FREET4, T3FREE, THYROIDAB in the last 72 hours.  Anemia Panel: No results for input(s): VITAMINB12, FOLATE, FERRITIN, TIBC, IRON,  RETICCTPCT in the last 72 hours.  Urine analysis:    Component Value Date/Time   COLORURINE YELLOW (A) 03/20/2018 1622   APPEARANCEUR CLEAR (A) 03/20/2018 1622   LABSPEC 1.015 03/20/2018 1622   PHURINE 5.0 03/20/2018 1622   GLUCOSEU >500 (A) 03/20/2018 1622   HGBUR SMALL (A) 03/20/2018 1622   BILIRUBINUR NEGATIVE 03/20/2018 1622   BILIRUBINUR small 02/26/2018 1202   KETONESUR 80 (A) 03/20/2018 1622   PROTEINUR NEGATIVE 03/20/2018 1622   UROBILINOGEN 1.0 02/26/2018 1202   UROBILINOGEN 1.0 12/11/2016 1020   NITRITE NEGATIVE 03/20/2018 1622   LEUKOCYTESUR NEGATIVE 03/20/2018 1622    Sepsis Labs: Lactic Acid, Venous    Component Value Date/Time   LATICACIDVEN 1.4 03/20/2018 1235  MICROBIOLOGY: Recent Results (from the past 240 hour(s))  Blood Culture (routine x 2)     Status: Abnormal   Collection Time: 03/20/18  7:59 AM  Result Value Ref Range Status   Specimen Description   Final    BLOOD EJ Performed at [facility], [address] [place], [place] [address]    Special Requests   Final    BOTTLES DRAWN AEROBIC AND ANAEROBIC Blood Culture adequate volume Performed at [facility], [address] [place], [place] [address]    Culture  Setup Time   Final    GRAM POSITIVE COCCI IN BOTH AEROBIC AND ANAEROBIC BOTTLES CRITICAL RESULT CALLED TO, READ BACK BY AND VERIFIED WITH: MATT MCBANE ON 03/21/18 AT 0001 QSD Performed at [facility], [address] [place], [place] [address]    Culture (A)  Final    STAPHYLOCOCCUS AUREUS SUSCEPTIBILITIES PERFORMED ON PREVIOUS CULTURE WITHIN THE LAST 5 DAYS. Performed at [facility], [address] [place], [place] [address]    Report Status 03/23/2018 FINAL  Final  Blood Culture (routine x 2)     Status: Abnormal   Collection Time: 03/20/18  7:59 AM  Result Value Ref Range Status   Specimen Description   Final    BLOOD EJ Performed at [facility], [address],  [place], [place] [address]    Special Requests   Final    BOTTLES DRAWN AEROBIC AND ANAEROBIC Blood Culture adequate volume Performed at [facility], [address] [place], [place] [address]    Culture  Setup Time   Final    GRAM POSITIVE COCCI IN BOTH AEROBIC AND ANAEROBIC BOTTLES CRITICAL RESULT CALLED TO, READ BACK BY AND VERIFIED WITH: MATT MCBANE ON 03/21/18 AT 0001 QSD Performed at [facility], [address], [place], [place] [address]    Culture STAPHYLOCOCCUS AUREUS (A)  Final   Report Status 03/23/2018 FINAL  Final   Organism ID, Bacteria STAPHYLOCOCCUS AUREUS  Final      Susceptibility   Staphylococcus aureus - MIC*    CIPROFLOXACIN <=0.5 SENSITIVE Sensitive     ERYTHROMYCIN <=0.25 SENSITIVE Sensitive     GENTAMICIN <=0.5 SENSITIVE Sensitive     OXACILLIN <=0.25 SENSITIVE Sensitive     TETRACYCLINE <=1 SENSITIVE Sensitive     VANCOMYCIN <=0.5 SENSITIVE Sensitive     TRIMETH/SULFA <=10 SENSITIVE Sensitive     CLINDAMYCIN <=0.25 SENSITIVE Sensitive     RIFAMPIN <=0.5 SENSITIVE Sensitive     Inducible Clindamycin NEGATIVE Sensitive     * STAPHYLOCOCCUS AUREUS  Blood Culture ID Panel (Reflexed)     Status: Abnormal   Collection Time: 03/20/18  7:59 AM  Result Value Ref Range Status   Enterococcus species NOT DETECTED NOT DETECTED Final   Listeria monocytogenes NOT DETECTED NOT DETECTED Final   Staphylococcus species DETECTED (A) NOT DETECTED Final    Comment: CRITICAL RESULT CALLED TO, READ BACK BY AND VERIFIED WITH: MATT MCBANE ON 03/21/18 AT 0001 QSD    Staphylococcus aureus DETECTED (A) NOT DETECTED Final    Comment: Methicillin (oxacillin) susceptible Staphylococcus aureus (MSSA). Preferred therapy is anti staphylococcal beta lactam antibiotic (Cefazolin or Nafcillin), unless clinically contraindicated. CRITICAL RESULT CALLED TO, READ BACK BY AND VERIFIED WITH: MATT MCBANE ON 03/21/18 AT 0001 QSD    Methicillin resistance NOT DETECTED NOT DETECTED Final    Streptococcus species NOT DETECTED NOT DETECTED Final   Streptococcus agalactiae NOT DETECTED NOT DETECTED Final  Streptococcus pneumoniae NOT DETECTED NOT DETECTED Final   Streptococcus pyogenes NOT DETECTED NOT DETECTED Final   Acinetobacter baumannii NOT DETECTED NOT DETECTED Final   Enterobacteriaceae species NOT DETECTED NOT DETECTED Final   Enterobacter cloacae complex NOT DETECTED NOT DETECTED Final   Escherichia coli NOT DETECTED NOT DETECTED Final   Klebsiella oxytoca NOT DETECTED NOT DETECTED Final   Klebsiella pneumoniae NOT DETECTED NOT DETECTED Final   Proteus species NOT DETECTED NOT DETECTED Final   Serratia marcescens NOT DETECTED NOT DETECTED Final   Haemophilus influenzae NOT DETECTED NOT DETECTED Final   Neisseria meningitidis NOT DETECTED NOT DETECTED Final   Pseudomonas aeruginosa NOT DETECTED NOT DETECTED Final   Candida albicans NOT DETECTED NOT DETECTED Final   Candida glabrata NOT DETECTED NOT DETECTED Final   Candida krusei NOT DETECTED NOT DETECTED Final   Candida parapsilosis NOT DETECTED NOT DETECTED Final   Candida tropicalis NOT DETECTED NOT DETECTED Final    Comment: Performed at [facility], [address] [place], [place] [address]  MRSA PCR Screening     Status: None   Collection Time: 03/20/18  3:50 PM  Result Value Ref Range Status   MRSA by PCR NEGATIVE NEGATIVE Final    Comment:        The GeneXpert MRSA Assay (FDA approved for NASAL specimens only), is one component of a comprehensive MRSA colonization surveillance program. It is not intended to diagnose MRSA infection nor to guide or monitor treatment for MRSA infections. Performed at [facility], [address] [place], [place] [address]   Anaerobic culture     Status: None   Collection Time: 03/22/18 10:36 AM  Result Value Ref Range Status   Specimen Description ABSCESS BACK  Final   Special Requests NONE  Final   Gram Stain   Final    RARE WBC PRESENT,  PREDOMINANTLY MONONUCLEAR ABUNDANT GRAM POSITIVE COCCI    Culture   Final    NO ANAEROBES ISOLATED Performed at [facility], [address] [place], [place] [address]    Report Status 03/27/2018 FINAL  Final  Aerobic Culture (superficial specimen)     Status: None   Collection Time: 03/22/18 10:36 AM  Result Value Ref Range Status   Specimen Description ABSCESS BACK  Final   Special Requests NONE  Final   Gram Stain   Final    FEW WBC PRESENT,BOTH PMN AND MONONUCLEAR MODERATE GRAM POSITIVE COCCI Performed at [facility], [address], [place], [place] [address]    Culture ABUNDANT STAPHYLOCOCCUS AUREUS  Final   Report Status 03/24/2018 FINAL  Final   Organism ID, Bacteria STAPHYLOCOCCUS AUREUS  Final      Susceptibility   Staphylococcus aureus - MIC*    CIPROFLOXACIN <=0.5 SENSITIVE Sensitive     ERYTHROMYCIN <=0.25 SENSITIVE Sensitive     GENTAMICIN <=0.5 SENSITIVE Sensitive     OXACILLIN 0.5 SENSITIVE Sensitive     TETRACYCLINE <=1 SENSITIVE Sensitive     VANCOMYCIN 1 SENSITIVE Sensitive     TRIMETH/SULFA <=10 SENSITIVE Sensitive     CLINDAMYCIN <=0.25 SENSITIVE Sensitive     RIFAMPIN <=0.5 SENSITIVE Sensitive     Inducible Clindamycin NEGATIVE Sensitive     * ABUNDANT STAPHYLOCOCCUS AUREUS  Culture, blood (Routine X 2) w Reflex to ID Panel     Status: None (Preliminary result)   Collection Time: 03/23/18  4:39 AM  Result Value Ref Range Status   Specimen Description BLOOD RIGHT HAND  Final   Special Requests   Final    BOTTLES DRAWN AEROBIC ONLY Blood Culture adequate volume   Culture   Final    NO GROWTH 4 DAYS Performed at [facility], [address], [place], [place] [address]    Report Status PENDING  Incomplete    RADIOLOGY STUDIES/RESULTS: Ct Thoracic Spine Wo Contrast  Result Date: 03/20/2018 CLINICAL DATA:  Progressive back pain.  Swelling of the lower back. EXAM: CT THORACIC SPINE WITHOUT CONTRAST TECHNIQUE: Multidetector CT  images of the thoracic were obtained using the standard protocol without intravenous contrast. COMPARISON:  None. FINDINGS: Alignment: Normal. 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 Paraspinal and other soft tissues: There is abnormal gas and fluid in the soft tissues of the right side of the base of the neck and in the right supraclavicular region. Paraspinal soft tissues appear normal throughout the thoracic spine. Disc levels: There is no evidence of disc protrusion or significant disc bulging or spinal or foraminal stenosis or other significant abnormality of the thoracic spine. IMPRESSION: 1. Evidence of cellulitis involving the right side of the base of the neck extending into the right supraclavicular region with fluid and gas in the soft tissues at the base of the right side of the neck. 2. No significant abnormality of the thoracic spine. 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 Electronically Signed   By: Francene Boyers M.D.   On: 03/20/2018 11:25   Ct Lumbar Spine Wo Contrast  Addendum Date: 03/20/2018   ADDENDUM REPORT: 03/20/2018 11:44 ADDENDUM: Critical Value/emergent results were called by telephone at the time of interpretation on 03/20/2018 at 11:30 am to Dr. Sharyn Creamer , who verbally acknowledged these results. Electronically Signed   By: Francene Boyers M.D.   On: 03/20/2018 11:44   Result Date: 03/20/2018 CLINICAL DATA:  Increasing low back pain and soft tissue swelling. EXAM: CT LUMBAR SPINE WITHOUT CONTRAST TECHNIQUE: Multidetector CT imaging of the lumbar spine was performed without intravenous contrast administration. Multiplanar CT image reconstructions were also generated. IV contrast could not be utilized due to the lack of an appropriate IV. COMPARISON:  None. FINDINGS: Segmentation: 5 lumbar type vertebrae. Alignment: Normal. Vertebrae: There is a moth-eaten appearance of the spinous processes of L3 and L4 which is worrisome for osteomyelitis. Bilateral pars defects at L5 with  grade 1 spondylolisthesis. 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 Paraspinal and other soft tissues: There is an extensive abnormal fluid collection in the subcutaneous soft tissues of the posterior aspect of the back extending from approximately L1-2 to S3. This fluid collection is lobulated and measures approximately 20 x 9 x 2.5 cm. It is centered slightly to the left of midline and has a mass effect upon the adjacent posterior paraspinal muscles. There is abnormal lucency in the underlying paraspinal muscles which could represent myositis. Disc levels: T11-12: No significant abnormality. Butterfly T12 vertebra. T12-L1: No significant abnormality. L1-2: Normal disc. Abnormal edema in the posterior paraspinal musculature with adjacent fluid collection in the subcutaneous fat of the posterior aspect of the back as described above. L2-3: Normal disc. L3-4: Normal disc. Lucency in the posterior paraspinal soft tissues extends to the posterior aspect of the thecal sac on image 80 of series 4 but there is no discrete epidural abscess. L4-5: Normal disc.  No evidence of epidural abscess. L5-S1: Grade 1 spondylolisthesis. No disc bulging or protrusion. Bilateral pars defects. No visible epidural abscess. IMPRESSION: 1. Extensive abnormal fluid collection in the subcutaneous fat of the midline of the  back with underlying marked abnormality of the posterior paraspinal musculature from L1-2 through S3. This is worrisome for subcutaneous abscess and myositis. 2. Moth-eaten appearance of the spinous processes of L3 and L4 consistent with osteomyelitis. 3. No discrete epidural abscess. However, the abnormal edema in the paraspinal musculature extends to the posterior aspect of the spinal canal at L3-4. 4. MRI with and without contrast may better define the extent of the soft tissue and infection and could detect epidural extension that is not apparent on this unenhanced CT scan. Electronically Signed: By: Francene Boyers M.D.  On: 03/20/2018 11:18   Mr Cervical Spine W Wo Contrast  Result Date: 03/22/2018 CLINICAL DATA:  Spine infection. EXAM: MRI TOTAL SPINE WITHOUT AND WITH CONTRAST TECHNIQUE: Multisequence MR imaging of the spine from the cervical spine to the sacrum was performed prior to and following IV contrast administration. CONTRAST:  6 cc Gadavist intravenous COMPARISON:  CT of the thoracic and lumbar spine from 2 days ago FINDINGS: MRI CERVICAL SPINE FINDINGS Alignment: Normal Vertebrae: No evidence of osseous infection. Canal/Cord: There is extensive spinal fluid collection preferentially in the ventral but also in the right more than left dorsal canal. Subarachnoid space is diffusely effaced. Maximal thickness is posterior to C2 at 9 mm. No cord signal abnormality. Posterior Fossa, vertebral arteries, paraspinal tissues: No retropharyngeal or other discrete soft tissue collection. Disc levels: C2-3: Unremarkable. C3-4: Small left foraminal protrusion with moderate narrowing C4-5: Unremarkable. C5-6: Disc narrowing and bulging with asymmetric left uncovertebral spurring. Left foraminal impingement C6-7: Right foraminal protrusion mild narrowing. C7-T1:Unremarkable. MRI THORACIC SPINE FINDINGS Alignment:  Normal Vertebrae: No evidence of osteomyelitis or discitis. T12 butterfly vertebra. Canal/Cord: Cervical ventral epidural collection continues throughout the thoracic levels. There is also a focal dorsal component at T3-4 to T5-6, where thecal sac effacement is accentuated and there is cord flattening. No cord edema. Paraspinal and other soft tissues: Paraspinous phlegmon on the left at T8-T12, with new complex left pleural effusion and lower lobe atelectasis Disc levels: T5-6 central disc protrusion. MRI LUMBAR SPINE FINDINGS Segmentation:  5 lumbar type vertebral bodies Alignment:  Grade 1 anterolisthesis at L5-S1. Vertebrae: Marrow edema and heterogeneous enhancement within the L2, L3, and L4 spinous processes. No  discitis or facet edema. Conus medullaris: Extends to the L1 level and is non edematous. There is extensive epidural collection completely effacing the thecal sac throughout the lumbar spine until L4-5 and below where the collection becomes ventral and right eccentric. The infection communicates with extensive bilateral abscess within the intrinsic back muscles via the interspinous space at L3-4. Patient had recent subcutaneous collection and left buttocks collection drainage, with packing seen in place. This midline, upper subcutaneous collection communicates with the paravertebral abscess along its superior margin based on postcontrast axial images. Paraspinal and other soft tissues: As above.  Distended bladder Disc levels: Chronic bilateral pars defects at L5. Critical Value/emergent results were called by telephone at the time of interpretation on 03/22/2018 at 3:04 pm to Dr. Thedore Mins , who verbally acknowledged these results. IMPRESSION: 1. Epidural abscess from C2 to sacrum as described. Maximal cord compression from T3-4 to T5-6. The thecal sac is completely effaced from L1 to L4-5. At the L3-4 interspinous space the spinal abscess communicates with large bilateral abscesses within the intrinsic back muscles. There is osteomyelitis of the L2, L3, and L4 spinous processes. No discitis or facet arthritis. 2. Left paravertebral abscess along the lower thoracic spine with small left empyema that is new from CT 2  days ago. 3. Distended bladder Electronically Signed   By: Marnee Spring M.D.   On: 03/22/2018 15:11   Mr Thoracic Spine W Wo Contrast  Result Date: 03/22/2018 CLINICAL DATA:  Spine infection. EXAM: MRI TOTAL SPINE WITHOUT AND WITH CONTRAST TECHNIQUE: Multisequence MR imaging of the spine from the cervical spine to the sacrum was performed prior to and following IV contrast administration. CONTRAST:  6 cc Gadavist intravenous COMPARISON:  CT of the thoracic and lumbar spine from 2 days ago FINDINGS:  MRI CERVICAL SPINE FINDINGS Alignment: Normal Vertebrae: No evidence of osseous infection. Canal/Cord: There is extensive spinal fluid collection preferentially in the ventral but also in the right more than left dorsal canal. Subarachnoid space is diffusely effaced. Maximal thickness is posterior to C2 at 9 mm. No cord signal abnormality. Posterior Fossa, vertebral arteries, paraspinal tissues: No retropharyngeal or other discrete soft tissue collection. Disc levels: C2-3: Unremarkable. C3-4: Small left foraminal protrusion with moderate narrowing C4-5: Unremarkable. C5-6: Disc narrowing and bulging with asymmetric left uncovertebral spurring. Left foraminal impingement C6-7: Right foraminal protrusion mild narrowing. C7-T1:Unremarkable. MRI THORACIC SPINE FINDINGS Alignment:  Normal Vertebrae: No evidence of osteomyelitis or discitis. T12 butterfly vertebra. Canal/Cord: Cervical ventral epidural collection continues throughout the thoracic levels. There is also a focal dorsal component at T3-4 to T5-6, where thecal sac effacement is accentuated and there is cord flattening. No cord edema. Paraspinal and other soft tissues: Paraspinous phlegmon on the left at T8-T12, with new complex left pleural effusion and lower lobe atelectasis Disc levels: T5-6 central disc protrusion. MRI LUMBAR SPINE FINDINGS Segmentation:  5 lumbar type vertebral bodies Alignment:  Grade 1 anterolisthesis at L5-S1. Vertebrae: Marrow edema and heterogeneous enhancement within the L2, L3, and L4 spinous processes. No discitis or facet edema. Conus medullaris: Extends to the L1 level and is non edematous. There is extensive epidural collection completely effacing the thecal sac throughout the lumbar spine until L4-5 and below where the collection becomes ventral and right eccentric. The infection communicates with extensive bilateral abscess within the intrinsic back muscles via the interspinous space at L3-4. Patient had recent subcutaneous  collection and left buttocks collection drainage, with packing seen in place. This midline, upper subcutaneous collection communicates with the paravertebral abscess along its superior margin based on postcontrast axial images. Paraspinal and other soft tissues: As above.  Distended bladder Disc levels: Chronic bilateral pars defects at L5. Critical Value/emergent results were called by telephone at the time of interpretation on 03/22/2018 at 3:04 pm to Dr. Thedore Mins , who verbally acknowledged these results. IMPRESSION: 1. Epidural abscess from C2 to sacrum as described. Maximal cord compression from T3-4 to T5-6. The thecal sac is completely effaced from L1 to L4-5. At the L3-4 interspinous space the spinal abscess communicates with large bilateral abscesses within the intrinsic back muscles. There is osteomyelitis of the L2, L3, and L4 spinous processes. No discitis or facet arthritis. 2. Left paravertebral abscess along the lower thoracic spine with small left empyema that is new from CT 2 days ago. 3. Distended bladder Electronically Signed   By: Marnee Spring M.D.   On: 03/22/2018 15:11   Mr Lumbar Spine W Wo Contrast  Result Date: 03/22/2018 CLINICAL DATA:  Spine infection. EXAM: MRI TOTAL SPINE WITHOUT AND WITH CONTRAST TECHNIQUE: Multisequence MR imaging of the spine from the cervical spine to the sacrum was performed prior to and following IV contrast administration. CONTRAST:  6 cc Gadavist intravenous COMPARISON:  CT of the thoracic and lumbar spine  from 2 days ago FINDINGS: MRI CERVICAL SPINE FINDINGS Alignment: Normal Vertebrae: No evidence of osseous infection. Canal/Cord: There is extensive spinal fluid collection preferentially in the ventral but also in the right more than left dorsal canal. Subarachnoid space is diffusely effaced. Maximal thickness is posterior to C2 at 9 mm. No cord signal abnormality. Posterior Fossa, vertebral arteries, paraspinal tissues: No retropharyngeal or other discrete  soft tissue collection. Disc levels: C2-3: Unremarkable. C3-4: Small left foraminal protrusion with moderate narrowing C4-5: Unremarkable. C5-6: Disc narrowing and bulging with asymmetric left uncovertebral spurring. Left foraminal impingement C6-7: Right foraminal protrusion mild narrowing. C7-T1:Unremarkable. MRI THORACIC SPINE FINDINGS Alignment:  Normal Vertebrae: No evidence of osteomyelitis or discitis. T12 butterfly vertebra. Canal/Cord: Cervical ventral epidural collection continues throughout the thoracic levels. There is also a focal dorsal component at T3-4 to T5-6, where thecal sac effacement is accentuated and there is cord flattening. No cord edema. Paraspinal and other soft tissues: Paraspinous phlegmon on the left at T8-T12, with new complex left pleural effusion and lower lobe atelectasis Disc levels: T5-6 central disc protrusion. MRI LUMBAR SPINE FINDINGS Segmentation:  5 lumbar type vertebral bodies Alignment:  Grade 1 anterolisthesis at L5-S1. Vertebrae: Marrow edema and heterogeneous enhancement within the L2, L3, and L4 spinous processes. No discitis or facet edema. Conus medullaris: Extends to the L1 level and is non edematous. There is extensive epidural collection completely effacing the thecal sac throughout the lumbar spine until L4-5 and below where the collection becomes ventral and right eccentric. The infection communicates with extensive bilateral abscess within the intrinsic back muscles via the interspinous space at L3-4. Patient had recent subcutaneous collection and left buttocks collection drainage, with packing seen in place. This midline, upper subcutaneous collection communicates with the paravertebral abscess along its superior margin based on postcontrast axial images. Paraspinal and other soft tissues: As above.  Distended bladder Disc levels: Chronic bilateral pars defects at L5. Critical Value/emergent results were called by telephone at the time of interpretation on  03/22/2018 at 3:04 pm to Dr. Thedore Mins , who verbally acknowledged these results. IMPRESSION: 1. Epidural abscess from C2 to sacrum as described. Maximal cord compression from T3-4 to T5-6. The thecal sac is completely effaced from L1 to L4-5. At the L3-4 interspinous space the spinal abscess communicates with large bilateral abscesses within the intrinsic back muscles. There is osteomyelitis of the L2, L3, and L4 spinous processes. No discitis or facet arthritis. 2. Left paravertebral abscess along the lower thoracic spine with small left empyema that is new from CT 2 days ago. 3. Distended bladder Electronically Signed   By: Marnee Spring M.D.   On: 03/22/2018 15:11   [place] Ekg Site Rite  Result Date: 03/21/2018 If Site Rite image not attached, placement could not be confirmed due to current cardiac rhythm.    LOS: 8 days   Signature  Susa Raring M.D on 03/28/2018 at 9:12 AM  To page go to [email or web address] - password [email or web address]

## 2018-03-29 LAB — GLUCOSE, CAPILLARY
Glucose-Capillary: 196 mg/dL — ABNORMAL HIGH (ref 70–99)
Glucose-Capillary: 246 mg/dL — ABNORMAL HIGH (ref 70–99)
Glucose-Capillary: 225 mg/dL — ABNORMAL HIGH (ref 70–99)
Glucose-Capillary: 369 mg/dL — ABNORMAL HIGH (ref 70–99)

## 2018-03-29 NOTE — Progress Notes (Signed)
Subjective: Patient reports "I'm feeling a whole lot better."  Objective: Vital signs in last 24 hours: Temp:  [99.2 F (37.3 C)-100.4 F (38 C)] 100.4 F (38 C) (09/30 0519) Pulse Rate:  [95-112] 112 (09/30 0519) Resp:  [20] 20 (09/29 1557) BP: (99-132)/(56-83) 132/83 (09/30 0519) SpO2:  [94 %-96 %] 96 % (09/30 0519)  Intake/Output from previous day: 09/29 0701 - 09/30 0700 In: 3469.6 [P.O.:2960; IV Piggyback:399.6] Out: 5840 [Urine:5840] Intake/Output this shift: No intake/output data recorded.  Physical Exam: Strength full upper and lower extremities.  No numbness.  Pain is improved.  Lab Results: No results for input(s): WBC, HGB, HCT, PLT in the last 72 hours. BMET No results for input(s): NA, K, CL, CO2, GLUCOSE, BUN, CREATININE, CALCIUM in the last 72 hours.  Studies/Results: No results found.  Assessment/Plan: [name] continuing to improve.  Continue with IV ABX.  No role for surgery at this time.    LOS: 9 days    Dorian Heckle, MD 03/29/2018, 8:56 AM

## 2018-03-29 NOTE — Progress Notes (Signed)
PROGRESS NOTE        PATIENT DETAILS Name: Tony Long Age: 42 y.o. Sex: male Date of Birth: 03-22-1976 Admit Date: 03/20/2018 Admitting Physician Kendell Bane, MD JYN:[id number], Rolm Gala, FNP  Brief Narrative: [name] a 42 y.o. male history of IVDA, DM-2, hypertension admitted for worsening back pain-further evaluation revealed MSSA bacteremia with tricuspid valve endocarditis, epidural abscess involving C2 through sacrum, and numerous soft tissue back abscesses.  Evaluated by general surgery-underwent I&D of her lower back soft tissue abscess, evaluated by neurosurgery-not felt to be a candidate for decompressive surgery due to extensive nature of the disease and lack of any significant neurological findings.  ID following with plans to continue Ancef with stop date of 05/25/2018.  See below for further details  Subjective:  Patient in bed, appears comfortable, denies any headache, no fever, no chest pain or pressure, no shortness of breath , no abdominal pain. No focal weakness.   Assessment/Plan:  MSSA bacteremia with tricuspid valve endocarditis, extensive epidural abscess (C2 through sacrum) and large soft tissue lower back abscess: Remains stable-due to extensive nature of the epidural abscess and lack of concerning neurological findings-neurosurgery does not recommend decompressive surgery.  ID recommending continuing IV antibiotics with stop date of 11/26.  Given history of IVDA-not a candidate for outpatient IV antimicrobial therapy. He has received a PICC line, continue IV antibiotics through PEG.   If patient cannot be placed to SNF-he will likely remain inpatient.  Patient aware of the extensive nature of this infection-with life-threatening and life disabling risks including quadriplegia along with life-threatening sepsis with this present infection which is nonsurgical.  This was explained by me to the patient several times along with his parents  bedside on 03/22/2018 and 03/23/2018.  Hypertension: BP controlled-continue metoprolol, clonidine started for drug withdrawal symptoms-being slowly tapered down-change to twice daily dosing.   Constipation: Had bowel movement overnight-probably secondary to narcotic use-have placed on scheduled MiraLAX and senna.  Acute urinary retention: Required Foley catheter placement on 03/26/2018 along with Flomax, continue for now.  Cocaine/Subutex abuse: Withdrawal symptoms much better-on 9/25 patient developed excessive sedation-Klonopin/Flexeril has been changed to as needed dosing-clonidine being tapered down.  On 03/28/2018 he requested me that he be put back on his Suboxone, he does not want short-acting pain medications anymore, changes will be made per his request.    Polysubstance abuse/IVDA: Counseled to quit.   DM-2: Poor outpatient control, continue Lantus and sliding scale.     CBG (last 3)  Recent Labs    03/28/18 1749 03/28/18 2146 03/29/18 0748  GLUCAP 207* 349* 196*   Lab Results  Component Value Date   HGBA1C 12.6 (A) 02/26/2018     DVT Prophylaxis: Prophylactic heparin  Code Status: Full code  Family Communication: Mother at bedside x 4   Disposition Plan: Remain inpatient-will be inpatient until 11/26-if SNF placement not possible.    Antimicrobial agents: Anti-infectives (From admission, onward)   Start     Dose/Rate Route Frequency Ordered Stop   03/21/18 0930  ceFAZolin (ANCEF) IVPB 2g/100 mL premix     2 g 200 mL/hr over 30 Minutes Intravenous Every 8 hours 03/21/18 0920        Procedures:   TEE - - Left ventricle: The cavity size was normal. Wall thickness wasnormal. Systolic function was normal. The estimated ejectionfraction was in the range  of 60% to 65%. - Aortic valve: No evidence of vegetation. - Mitral valve: No evidence of vegetation. - Left atrium: No evidence of thrombus in the atrial cavity or appendage. No evidence of thrombus in  the appendage. - Right atrium: No evidence of thrombus in the atrial cavity orappendage. - Tricuspid valve: There was a vegetation. There was a small (1.2cmx .6cm), mobile vegetation on the septal leaflet. There was mildregurgitation. - Pulmonic valve: No evidence of vegetation.  Impressions:   Tricuspid valve endocarditis. Discussed with primary team   MRI C-T-L Spine - 1. Epidural abscess from C2 to sacrum as described. Maximal cord compression from T3-4 to T5-6. The thecal sac is completely effaced from L1 to L4-5. At the L3-4 interspinous space the spinal abscess communicates with large bilateral abscesses within the intrinsic back muscles. There is osteomyelitis of the L2, L3, and L4 spinous processes. No discitis or facet arthritis. 2. Left paravertebral abscess along the lower thoracic spine with small left empyema that is new from CT 2 days ago. 3. Distended bladder  CT - 1. Evidence of cellulitis involving the right side of the base of the neck extending into the right supraclavicular region with fluid and gas in the soft tissues at the base of the right side of the neck. 2. No significant abnormality of the thoracic spine. 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  CT -  1. Extensive abnormal fluid collection in the subcutaneous fat of the midline of the back with underlying marked abnormality of the posterior paraspinal musculature from L1-2 through S3. This is worrisome for subcutaneous abscess and myositis. 2. Moth-eaten appearance of the spinous processes of L3 and L4 consistent with osteomyelitis. 3. No discrete epidural abscess. However, the abnormal edema in the paraspinal musculature extends to the posterior aspect of the spinal canal at L3-4. 4. MRI with and without contrast may better define the extent of the soft tissue and infection and could detect epidural extension that is not apparent on this unenhanced CT scan.  9/22>> PICC line  I&D by [facility] of back soft tissue  abscess on -  03/22/18    CONSULTS:  ID, general surgery and Neurosurgery  Time spent: 25- minutes-Greater than 50% of this time was spent in counseling, explanation of diagnosis, planning of further management, and coordination of care.  MEDICATIONS: Scheduled Meds: . buprenorphine-naloxone  1 tablet Sublingual Daily  . cloNIDine  0.1 mg Oral BID  . gabapentin  300 mg Oral TID  . heparin injection (subcutaneous)  5,000 Units Subcutaneous Q8H  . insulin aspart  0-5 Units Subcutaneous QHS  . insulin aspart  0-9 Units Subcutaneous TID WC  . insulin glargine  25 Units Subcutaneous QHS  . metoprolol tartrate  50 mg Oral BID  . nicotine  21 mg Transdermal Daily  . polyethylene glycol  17 g Oral BID  . senna-docusate  2 tablet Oral QHS  . sodium chloride flush  10-40 mL Intracatheter Q12H  . tamsulosin  0.4 mg Oral Daily   Continuous Infusions: .  ceFAZolin (ANCEF) IV Stopped (03/29/18 0547)   PRN Meds:.bisacodyl, clonazepam, cyclobenzaprine, hydrALAZINE, magnesium hydroxide, metoprolol tartrate, sodium phosphate   PHYSICAL EXAM: Vital signs: Vitals:   03/28/18 0611 03/28/18 1557 03/28/18 2148 03/29/18 0519  BP: (!) 120/56 112/63 (!) 99/56 132/83  Pulse: 98 98 95 (!) 112  Resp:  20    Temp: 99.9 F (37.7 C) 99.2 F (37.3 C) 99.4 F (37.4 C) (!) 100.4 F (38 C)  TempSrc: Oral Oral  Oral Oral  SpO2: 97% 96% 94% 96%  Weight:      Height:       Filed Weights   03/21/18 0602  Weight: 68 kg   Body mass index is 20.92 kg/m.   Awake Alert, Oriented X 3, No new F.N deficits, Normal affect [place].AT,PERRAL Supple Neck,No JVD, No cervical lymphadenopathy appriciated.  Symmetrical Chest wall movement, Good air movement bilaterally, CTAB RRR,No Gallops, Rubs or new Murmurs, No Parasternal Heave +ve B.Sounds, Abd Soft, No tenderness, No organomegaly appriciated, No rebound - guarding or rigidity. No Cyanosis, Clubbing or edema, No new Rash or bruise Foley catheter in place  I  have personally reviewed following labs and imaging studies  LABORATORY DATA: CBC: Recent Labs  Lab 03/23/18 0439 03/24/18 0434 03/26/18 0640  WBC 21.2* 16.7* 15.4*  HGB 11.0* 11.4* 10.2*  HCT 34.9* 35.7* 31.4*  MCV 96.4 97.3 95.2  PLT 311 293 289    Basic Metabolic Panel: Recent Labs  Lab 03/23/18 0439 03/24/18 0434 03/26/18 0640  NA 138 140 138  K 3.1* 2.9* 3.5  CL 106 104 103  CO2 22 24 22   GLUCOSE 281* 174* 319*  BUN 18 17 15   CREATININE 0.73 0.64 1.08  CALCIUM 9.0 9.3 8.9  MG 1.8 1.9  --     GFR: Estimated Creatinine Clearance: 86.6 mL/min (by C-G formula based on SCr of 1.08 mg/dL).  Liver Function Tests: Recent Labs  Lab 03/23/18 0439 03/24/18 0434  AST 9* 17  ALT 7  BLOOD 9  ALKPHOS 126 121  BILITOT 0.5 0.5  PROT 6.4* 6.6  ALBUMIN 1.8* 1.9*   No results for input(s): LIPASE, AMYLASE in the last 168 hours. No results for input(s): AMMONIA in the last 168 hours.  Coagulation Profile: No results for input(s): INR, PROTIME in the last 168 hours.  Cardiac Enzymes: No results for input(s): CKTOTAL, CKMB, CKMBINDEX, TROPONINI in the last 168 hours.  BNP (last 3 results) No results for input(s): PROBNP in the last 8760 hours.  HbA1C: No results for input(s): HGBA1C in the last 72 hours.  CBG: Recent Labs  Lab 03/28/18 0828 03/28/18 1233 03/28/18 1749 03/28/18 2146 03/29/18 0748  GLUCAP 224* 178* 207* 349* 196*    Lipid Profile: No results for input(s): CHOL, HDL, LDLCALC, TRIG, CHOLHDL, LDLDIRECT in the last 72 hours.  Thyroid Function Tests: No results for input(s): TSH, T4TOTAL, FREET4, T3FREE, THYROIDAB in the last 72 hours.  Anemia Panel: No results for input(s): VITAMINB12, FOLATE, FERRITIN, TIBC, IRON, RETICCTPCT in the last 72 hours.  Urine analysis:    Component Value Date/Time   COLORURINE YELLOW (A) 03/20/2018 1622   APPEARANCEUR CLEAR (A) 03/20/2018 1622   LABSPEC 1.015 03/20/2018 1622   PHURINE 5.0 03/20/2018 1622     GLUCOSEU >500 (A) 03/20/2018 1622   HGBUR SMALL (A) 03/20/2018 1622   BILIRUBINUR NEGATIVE 03/20/2018 1622   BILIRUBINUR small 02/26/2018 1202   KETONESUR 80 (A) 03/20/2018 1622   PROTEINUR NEGATIVE 03/20/2018 1622   UROBILINOGEN 1.0 02/26/2018 1202   UROBILINOGEN 1.0 12/11/2016 1020   NITRITE NEGATIVE 03/20/2018 1622   LEUKOCYTESUR NEGATIVE 03/20/2018 1622    Sepsis Labs: Lactic Acid, Venous    Component Value Date/Time   LATICACIDVEN 1.4 03/20/2018 1235    MICROBIOLOGY: Recent Results (from the past 240 hour(s))  Blood Culture (routine x 2)     Status: Abnormal   Collection Time: 03/20/18  7:59 AM  Result Value Ref Range Status   Specimen Description  Final    BLOOD EJ Performed at [facility], [address] [place], [place] [address]    Special Requests   Final    BOTTLES DRAWN AEROBIC AND ANAEROBIC Blood Culture adequate volume Performed at [facility], [address] [place], [place] [address]    Culture  Setup Time   Final    GRAM POSITIVE COCCI IN BOTH AEROBIC AND ANAEROBIC BOTTLES CRITICAL RESULT CALLED TO, READ BACK BY AND VERIFIED WITH: MATT MCBANE ON 03/21/18 AT 0001 QSD Performed at [facility], [address] [place], [place] [address]    Culture (A)  Final    STAPHYLOCOCCUS AUREUS SUSCEPTIBILITIES PERFORMED ON PREVIOUS CULTURE WITHIN THE LAST 5 DAYS. Performed at [facility], [address], [place], [place] [address]    Report Status 03/23/2018 FINAL  Final  Blood Culture (routine x 2)     Status: Abnormal   Collection Time: 03/20/18  7:59 AM  Result Value Ref Range Status   Specimen Description   Final    BLOOD EJ Performed at [facility], [address], [place], [place] [address]    Special Requests   Final    BOTTLES DRAWN AEROBIC AND ANAEROBIC Blood Culture adequate volume Performed at [name], [address] [place], [place] [address]    Culture  Setup Time   Final     GRAM POSITIVE COCCI IN BOTH AEROBIC AND ANAEROBIC BOTTLES CRITICAL RESULT CALLED TO, READ BACK BY AND VERIFIED WITH: MATT MCBANE ON 03/21/18 AT 0001 QSD Performed at [facility], [address] [place], [place] [address]    Culture STAPHYLOCOCCUS AUREUS (A)  Final   Report Status 03/23/2018 FINAL  Final   Organism ID, Bacteria STAPHYLOCOCCUS AUREUS  Final      Susceptibility   Staphylococcus aureus - MIC*    CIPROFLOXACIN <=0.5 SENSITIVE Sensitive     ERYTHROMYCIN <=0.25 SENSITIVE Sensitive     GENTAMICIN <=0.5 SENSITIVE Sensitive     OXACILLIN <=0.25 SENSITIVE Sensitive     TETRACYCLINE <=1 SENSITIVE Sensitive     VANCOMYCIN <=0.5 SENSITIVE Sensitive     TRIMETH/SULFA <=10 SENSITIVE Sensitive     CLINDAMYCIN <=0.25 SENSITIVE Sensitive     RIFAMPIN <=0.5 SENSITIVE Sensitive     Inducible Clindamycin NEGATIVE Sensitive     * STAPHYLOCOCCUS AUREUS  Blood Culture ID Panel (Reflexed)     Status: Abnormal   Collection Time: 03/20/18  7:59 AM  Result Value Ref Range Status   Enterococcus species NOT DETECTED NOT DETECTED Final   Listeria monocytogenes NOT DETECTED NOT DETECTED Final   Staphylococcus species DETECTED (A) NOT DETECTED Final    Comment: CRITICAL RESULT CALLED TO, READ BACK BY AND VERIFIED WITH: MATT MCBANE ON 03/21/18 AT 0001 QSD    Staphylococcus aureus DETECTED (A) NOT DETECTED Final    Comment: Methicillin (oxacillin) susceptible Staphylococcus aureus (MSSA). Preferred therapy is anti staphylococcal beta lactam antibiotic (Cefazolin or Nafcillin), unless clinically contraindicated. CRITICAL RESULT CALLED TO, READ BACK BY AND VERIFIED WITH: MATT MCBANE ON 03/21/18 AT 0001 QSD    Methicillin resistance NOT DETECTED NOT DETECTED Final   Streptococcus species NOT DETECTED NOT DETECTED Final   Streptococcus agalactiae NOT DETECTED NOT DETECTED Final   Streptococcus pneumoniae NOT DETECTED NOT DETECTED Final   Streptococcus pyogenes NOT DETECTED NOT DETECTED Final    Acinetobacter baumannii NOT DETECTED NOT DETECTED Final   Enterobacteriaceae species NOT DETECTED NOT DETECTED Final   Enterobacter cloacae complex NOT  DETECTED NOT DETECTED Final   Escherichia coli NOT DETECTED NOT DETECTED Final   Klebsiella oxytoca NOT DETECTED NOT DETECTED Final   Klebsiella pneumoniae NOT DETECTED NOT DETECTED Final   Proteus species NOT DETECTED NOT DETECTED Final   Serratia marcescens NOT DETECTED NOT DETECTED Final   Haemophilus influenzae NOT DETECTED NOT DETECTED Final   Neisseria meningitidis NOT DETECTED NOT DETECTED Final   Pseudomonas aeruginosa NOT DETECTED NOT DETECTED Final   Candida albicans NOT DETECTED NOT DETECTED Final   Candida glabrata NOT DETECTED NOT DETECTED Final   Candida krusei NOT DETECTED NOT DETECTED Final   Candida parapsilosis NOT DETECTED NOT DETECTED Final   Candida tropicalis NOT DETECTED NOT DETECTED Final    Comment: Performed at [facility], [address] [place], [place] [address]  MRSA PCR Screening     Status: None   Collection Time: 03/20/18  3:50 PM  Result Value Ref Range Status   MRSA by PCR NEGATIVE NEGATIVE Final    Comment:        The GeneXpert MRSA Assay (FDA approved for NASAL specimens only), is one component of a comprehensive MRSA colonization surveillance program. It is not intended to diagnose MRSA infection nor to guide or monitor treatment for MRSA infections. Performed at [facility], [address] [place], [place] [address]   Anaerobic culture     Status: None   Collection Time: 03/22/18 10:36 AM  Result Value Ref Range Status   Specimen Description ABSCESS BACK  Final   Special Requests NONE  Final   Gram Stain   Final    RARE WBC PRESENT, PREDOMINANTLY MONONUCLEAR ABUNDANT GRAM POSITIVE COCCI    Culture   Final    NO ANAEROBES ISOLATED Performed at [facility], [address] [place], [place] [address]    Report Status 03/27/2018 FINAL  Final  Aerobic Culture  (superficial specimen)     Status: None   Collection Time: 03/22/18 10:36 AM  Result Value Ref Range Status   Specimen Description ABSCESS BACK  Final   Special Requests NONE  Final   Gram Stain   Final    FEW WBC PRESENT,BOTH PMN AND MONONUCLEAR MODERATE GRAM POSITIVE COCCI Performed at [facility], [address], [place], [place] [address]    Culture ABUNDANT STAPHYLOCOCCUS AUREUS  Final   Report Status 03/24/2018 FINAL  Final   Organism ID, Bacteria STAPHYLOCOCCUS AUREUS  Final      Susceptibility   Staphylococcus aureus - MIC*    CIPROFLOXACIN <=0.5 SENSITIVE Sensitive     ERYTHROMYCIN <=0.25 SENSITIVE Sensitive     GENTAMICIN <=0.5 SENSITIVE Sensitive     OXACILLIN 0.5 SENSITIVE Sensitive     TETRACYCLINE <=1 SENSITIVE Sensitive     VANCOMYCIN 1 SENSITIVE Sensitive     TRIMETH/SULFA <=10 SENSITIVE Sensitive     CLINDAMYCIN <=0.25 SENSITIVE Sensitive     RIFAMPIN <=0.5 SENSITIVE Sensitive     Inducible Clindamycin NEGATIVE Sensitive     * ABUNDANT STAPHYLOCOCCUS AUREUS  Culture, blood (Routine X 2) w Reflex to ID Panel     Status: None   Collection Time: 03/23/18  4:39 AM  Result Value Ref Range Status   Specimen Description BLOOD RIGHT HAND  Final   Special Requests   Final    BOTTLES DRAWN AEROBIC ONLY Blood Culture adequate volume   Culture   Final    NO GROWTH 5 DAYS Performed at [facility], [address], [place], [place] [address]    Report Status 03/28/2018 FINAL  Final    RADIOLOGY STUDIES/RESULTS: Ct Thoracic Spine Wo Contrast  Result Date: 03/20/2018 CLINICAL DATA:  Progressive back pain.  Swelling of the lower back. EXAM: CT THORACIC SPINE WITHOUT CONTRAST TECHNIQUE: Multidetector CT images of the thoracic were obtained using the standard protocol without intravenous contrast. COMPARISON:  None. FINDINGS: Alignment: Normal. 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 Paraspinal and other soft tissues: There is abnormal gas and fluid in  the soft tissues of the right side of the base of the neck and in the right supraclavicular region. Paraspinal soft tissues appear normal throughout the thoracic spine. Disc levels: There is no evidence of disc protrusion or significant disc bulging or spinal or foraminal stenosis or other significant abnormality of the thoracic spine. IMPRESSION: 1. Evidence of cellulitis involving the right side of the base of the neck extending into the right supraclavicular region with fluid and gas in the soft tissues at the base of the right side of the neck. 2. No significant abnormality of the thoracic spine. 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 Electronically Signed   By: Francene Boyers M.D.   On: 03/20/2018 11:25   Ct Lumbar Spine Wo Contrast  Addendum Date: 03/20/2018   ADDENDUM REPORT: 03/20/2018 11:44 ADDENDUM: Critical Value/emergent results were called by telephone at the time of interpretation on 03/20/2018 at 11:30 am to Dr. Sharyn Creamer , who verbally acknowledged these results. Electronically Signed   By: Francene Boyers M.D.   On: 03/20/2018 11:44   Result Date: 03/20/2018 CLINICAL DATA:  Increasing low back pain and soft tissue swelling. EXAM: CT LUMBAR SPINE WITHOUT CONTRAST TECHNIQUE: Multidetector CT imaging of the lumbar spine was performed without intravenous contrast administration. Multiplanar CT image reconstructions were also generated. IV contrast could not be utilized due to the lack of an appropriate IV. COMPARISON:  None. FINDINGS: Segmentation: 5 lumbar type vertebrae. Alignment: Normal. Vertebrae: There is a moth-eaten appearance of the spinous processes of L3 and L4 which is worrisome for osteomyelitis. Bilateral pars defects at L5 with grade 1 spondylolisthesis. 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 Paraspinal and other soft tissues: There is an extensive abnormal fluid collection in the subcutaneous soft tissues of the posterior aspect of the back extending from approximately L1-2 to S3.  This fluid collection is lobulated and measures approximately 20 x 9 x 2.5 cm. It is centered slightly to the left of midline and has a mass effect upon the adjacent posterior paraspinal muscles. There is abnormal lucency in the underlying paraspinal muscles which could represent myositis. Disc levels: T11-12: No significant abnormality. Butterfly T12 vertebra. T12-L1: No significant abnormality. L1-2: Normal disc. Abnormal edema in the posterior paraspinal musculature with adjacent fluid collection in the subcutaneous fat of the posterior aspect of the back as described above. L2-3: Normal disc. L3-4: Normal disc. Lucency in the posterior paraspinal soft tissues extends to the posterior aspect of the thecal sac on image 80 of series 4 but there is no discrete epidural abscess. L4-5: Normal disc.  No evidence of epidural abscess. L5-S1: Grade 1 spondylolisthesis. No disc bulging or protrusion. Bilateral pars defects. No visible epidural abscess. IMPRESSION: 1. Extensive abnormal fluid collection in the subcutaneous fat of the midline of the back with underlying marked abnormality of the posterior paraspinal musculature from L1-2 through S3. This is worrisome for subcutaneous abscess and myositis. 2. Moth-eaten appearance of the spinous processes of L3 and L4 consistent with osteomyelitis. 3. No discrete epidural  abscess. However, the abnormal edema in the paraspinal musculature extends to the posterior aspect of the spinal canal at L3-4. 4. MRI with and without contrast may better define the extent of the soft tissue and infection and could detect epidural extension that is not apparent on this unenhanced CT scan. Electronically Signed: By: Francene Boyers M.D. On: 03/20/2018 11:18   Mr Cervical Spine W Wo Contrast  Result Date: 03/22/2018 CLINICAL DATA:  Spine infection. EXAM: MRI TOTAL SPINE WITHOUT AND WITH CONTRAST TECHNIQUE: Multisequence MR imaging of the spine from the cervical spine to the sacrum was  performed prior to and following IV contrast administration. CONTRAST:  6 cc Gadavist intravenous COMPARISON:  CT of the thoracic and lumbar spine from 2 days ago FINDINGS: MRI CERVICAL SPINE FINDINGS Alignment: Normal Vertebrae: No evidence of osseous infection. Canal/Cord: There is extensive spinal fluid collection preferentially in the ventral but also in the right more than left dorsal canal. Subarachnoid space is diffusely effaced. Maximal thickness is posterior to C2 at 9 mm. No cord signal abnormality. Posterior Fossa, vertebral arteries, paraspinal tissues: No retropharyngeal or other discrete soft tissue collection. Disc levels: C2-3: Unremarkable. C3-4: Small left foraminal protrusion with moderate narrowing C4-5: Unremarkable. C5-6: Disc narrowing and bulging with asymmetric left uncovertebral spurring. Left foraminal impingement C6-7: Right foraminal protrusion mild narrowing. C7-T1:Unremarkable. MRI THORACIC SPINE FINDINGS Alignment:  Normal Vertebrae: No evidence of osteomyelitis or discitis. T12 butterfly vertebra. Canal/Cord: Cervical ventral epidural collection continues throughout the thoracic levels. There is also a focal dorsal component at T3-4 to T5-6, where thecal sac effacement is accentuated and there is cord flattening. No cord edema. Paraspinal and other soft tissues: Paraspinous phlegmon on the left at T8-T12, with new complex left pleural effusion and lower lobe atelectasis Disc levels: T5-6 central disc protrusion. MRI LUMBAR SPINE FINDINGS Segmentation:  5 lumbar type vertebral bodies Alignment:  Grade 1 anterolisthesis at L5-S1. Vertebrae: Marrow edema and heterogeneous enhancement within the L2, L3, and L4 spinous processes. No discitis or facet edema. Conus medullaris: Extends to the L1 level and is non edematous. There is extensive epidural collection completely effacing the thecal sac throughout the lumbar spine until L4-5 and below where the collection becomes ventral and right  eccentric. The infection communicates with extensive bilateral abscess within the intrinsic back muscles via the interspinous space at L3-4. Patient had recent subcutaneous collection and left buttocks collection drainage, with packing seen in place. This midline, upper subcutaneous collection communicates with the paravertebral abscess along its superior margin based on postcontrast axial images. Paraspinal and other soft tissues: As above.  Distended bladder Disc levels: Chronic bilateral pars defects at L5. Critical Value/emergent results were called by telephone at the time of interpretation on 03/22/2018 at 3:04 pm to Dr. Thedore Mins , who verbally acknowledged these results. IMPRESSION: 1. Epidural abscess from C2 to sacrum as described. Maximal cord compression from T3-4 to T5-6. The thecal sac is completely effaced from L1 to L4-5. At the L3-4 interspinous space the spinal abscess communicates with large bilateral abscesses within the intrinsic back muscles. There is osteomyelitis of the L2, L3, and L4 spinous processes. No discitis or facet arthritis. 2. Left paravertebral abscess along the lower thoracic spine with small left empyema that is new from CT 2 days ago. 3. Distended bladder Electronically Signed   By: Marnee Spring M.D.   On: 03/22/2018 15:11   Mr Thoracic Spine W Wo Contrast  Result Date: 03/22/2018 CLINICAL DATA:  Spine infection. EXAM: MRI TOTAL SPINE  WITHOUT AND WITH CONTRAST TECHNIQUE: Multisequence MR imaging of the spine from the cervical spine to the sacrum was performed prior to and following IV contrast administration. CONTRAST:  6 cc Gadavist intravenous COMPARISON:  CT of the thoracic and lumbar spine from 2 days ago FINDINGS: MRI CERVICAL SPINE FINDINGS Alignment: Normal Vertebrae: No evidence of osseous infection. Canal/Cord: There is extensive spinal fluid collection preferentially in the ventral but also in the right more than left dorsal canal. Subarachnoid space is diffusely  effaced. Maximal thickness is posterior to C2 at 9 mm. No cord signal abnormality. Posterior Fossa, vertebral arteries, paraspinal tissues: No retropharyngeal or other discrete soft tissue collection. Disc levels: C2-3: Unremarkable. C3-4: Small left foraminal protrusion with moderate narrowing C4-5: Unremarkable. C5-6: Disc narrowing and bulging with asymmetric left uncovertebral spurring. Left foraminal impingement C6-7: Right foraminal protrusion mild narrowing. C7-T1:Unremarkable. MRI THORACIC SPINE FINDINGS Alignment:  Normal Vertebrae: No evidence of osteomyelitis or discitis. T12 butterfly vertebra. Canal/Cord: Cervical ventral epidural collection continues throughout the thoracic levels. There is also a focal dorsal component at T3-4 to T5-6, where thecal sac effacement is accentuated and there is cord flattening. No cord edema. Paraspinal and other soft tissues: Paraspinous phlegmon on the left at T8-T12, with new complex left pleural effusion and lower lobe atelectasis Disc levels: T5-6 central disc protrusion. MRI LUMBAR SPINE FINDINGS Segmentation:  5 lumbar type vertebral bodies Alignment:  Grade 1 anterolisthesis at L5-S1. Vertebrae: Marrow edema and heterogeneous enhancement within the L2, L3, and L4 spinous processes. No discitis or facet edema. Conus medullaris: Extends to the L1 level and is non edematous. There is extensive epidural collection completely effacing the thecal sac throughout the lumbar spine until L4-5 and below where the collection becomes ventral and right eccentric. The infection communicates with extensive bilateral abscess within the intrinsic back muscles via the interspinous space at L3-4. Patient had recent subcutaneous collection and left buttocks collection drainage, with packing seen in place. This midline, upper subcutaneous collection communicates with the paravertebral abscess along its superior margin based on postcontrast axial images. Paraspinal and other soft  tissues: As above.  Distended bladder Disc levels: Chronic bilateral pars defects at L5. Critical Value/emergent results were called by telephone at the time of interpretation on 03/22/2018 at 3:04 pm to Dr. Thedore Mins , who verbally acknowledged these results. IMPRESSION: 1. Epidural abscess from C2 to sacrum as described. Maximal cord compression from T3-4 to T5-6. The thecal sac is completely effaced from L1 to L4-5. At the L3-4 interspinous space the spinal abscess communicates with large bilateral abscesses within the intrinsic back muscles. There is osteomyelitis of the L2, L3, and L4 spinous processes. No discitis or facet arthritis. 2. Left paravertebral abscess along the lower thoracic spine with small left empyema that is new from CT 2 days ago. 3. Distended bladder Electronically Signed   By: Marnee Spring M.D.   On: 03/22/2018 15:11   Mr Lumbar Spine W Wo Contrast  Result Date: 03/22/2018 CLINICAL DATA:  Spine infection. EXAM: MRI TOTAL SPINE WITHOUT AND WITH CONTRAST TECHNIQUE: Multisequence MR imaging of the spine from the cervical spine to the sacrum was performed prior to and following IV contrast administration. CONTRAST:  6 cc Gadavist intravenous COMPARISON:  CT of the thoracic and lumbar spine from 2 days ago FINDINGS: MRI CERVICAL SPINE FINDINGS Alignment: Normal Vertebrae: No evidence of osseous infection. Canal/Cord: There is extensive spinal fluid collection preferentially in the ventral but also in the right more than left dorsal canal. Subarachnoid space  is diffusely effaced. Maximal thickness is posterior to C2 at 9 mm. No cord signal abnormality. Posterior Fossa, vertebral arteries, paraspinal tissues: No retropharyngeal or other discrete soft tissue collection. Disc levels: C2-3: Unremarkable. C3-4: Small left foraminal protrusion with moderate narrowing C4-5: Unremarkable. C5-6: Disc narrowing and bulging with asymmetric left uncovertebral spurring. Left foraminal impingement C6-7:  Right foraminal protrusion mild narrowing. C7-T1:Unremarkable. MRI THORACIC SPINE FINDINGS Alignment:  Normal Vertebrae: No evidence of osteomyelitis or discitis. T12 butterfly vertebra. Canal/Cord: Cervical ventral epidural collection continues throughout the thoracic levels. There is also a focal dorsal component at T3-4 to T5-6, where thecal sac effacement is accentuated and there is cord flattening. No cord edema. Paraspinal and other soft tissues: Paraspinous phlegmon on the left at T8-T12, with new complex left pleural effusion and lower lobe atelectasis Disc levels: T5-6 central disc protrusion. MRI LUMBAR SPINE FINDINGS Segmentation:  5 lumbar type vertebral bodies Alignment:  Grade 1 anterolisthesis at L5-S1. Vertebrae: Marrow edema and heterogeneous enhancement within the L2, L3, and L4 spinous processes. No discitis or facet edema. Conus medullaris: Extends to the L1 level and is non edematous. There is extensive epidural collection completely effacing the thecal sac throughout the lumbar spine until L4-5 and below where the collection becomes ventral and right eccentric. The infection communicates with extensive bilateral abscess within the intrinsic back muscles via the interspinous space at L3-4. Patient had recent subcutaneous collection and left buttocks collection drainage, with packing seen in place. This midline, upper subcutaneous collection communicates with the paravertebral abscess along its superior margin based on postcontrast axial images. Paraspinal and other soft tissues: As above.  Distended bladder Disc levels: Chronic bilateral pars defects at L5. Critical Value/emergent results were called by telephone at the time of interpretation on 03/22/2018 at 3:04 pm to Dr. Thedore Mins , who verbally acknowledged these results. IMPRESSION: 1. Epidural abscess from C2 to sacrum as described. Maximal cord compression from T3-4 to T5-6. The thecal sac is completely effaced from L1 to L4-5. At the L3-4  interspinous space the spinal abscess communicates with large bilateral abscesses within the intrinsic back muscles. There is osteomyelitis of the L2, L3, and L4 spinous processes. No discitis or facet arthritis. 2. Left paravertebral abscess along the lower thoracic spine with small left empyema that is new from CT 2 days ago. 3. Distended bladder Electronically Signed   By: Marnee Spring M.D.   On: 03/22/2018 15:11   [place] Ekg Site Rite  Result Date: 03/21/2018 If Site Rite image not attached, placement could not be confirmed due to current cardiac rhythm.    LOS: 9 days   Signature  Susa Raring M.D on 03/29/2018 at 10:46 AM  To page go to [email or web address] - password [facility]

## 2018-03-29 NOTE — Progress Notes (Signed)
Patient ID: Tony Long, male   DOB: Nov 09, 1975, 42 y.o.   MRN: [id number]    7 Days Post-Op  Subjective: No new complaints today.  Sitting up in a chair.  Objective: Vital signs in last 24 hours: Temp:  [99.2 F (37.3 C)-100.4 F (38 C)] 100.4 F (38 C) (09/30 0519) Pulse Rate:  [95-112] 112 (09/30 0519) Resp:  [20] 20 (09/29 1557) BP: (99-132)/(56-83) 132/83 (09/30 0519) SpO2:  [94 %-96 %] 96 % (09/30 0519) Last BM Date: 03/28/18  Intake/Output from previous day: 09/29 0701 - 09/30 0700 In: 3469.6 [P.O.:2960; IV Piggyback:399.6] Out: 5840 [Urine:5840] Intake/Output this shift: No intake/output data recorded.  PE: Back: back wounds are stable.  Tissues are cleaning up, but still with purulent drainage.  Lab Results:  No results for input(s): WBC, HGB, HCT, PLT in the last 72 hours. BMET No results for input(s): NA, K, CL, CO2, GLUCOSE, BUN, CREATININE, CALCIUM in the last 72 hours. PT/INR No results for input(s): LABPROT, INR in the last 72 hours. CMP     Component Value Date/Time   NA 138 03/26/2018 0640   NA 137 10/14/2017 1619   K 3.5 03/26/2018 0640   CL 103 03/26/2018 0640   CO2 22 03/26/2018 0640   GLUCOSE 319 (H) 03/26/2018 0640   BUN 15 03/26/2018 0640   BUN 9 10/14/2017 1619   CREATININE 1.08 03/26/2018 0640   CREATININE 0.77 12/11/2016 1045   CALCIUM 8.9 03/26/2018 0640   PROT 6.6 03/24/2018 0434   PROT 8.5 10/14/2017 1619   ALBUMIN 1.9 (L) 03/24/2018 0434   ALBUMIN 4.9 10/14/2017 1619   AST 17 03/24/2018 0434   ALT 9 03/24/2018 0434   ALT BLOOD 03/23/2018 0439   ALKPHOS 121 03/24/2018 0434   BILITOT 0.5 03/24/2018 0434   BILITOT 0.4 10/14/2017 1619   GFRNONAA >60 03/26/2018 0640   GFRNONAA >89 12/11/2016 1045   GFRAA >60 03/26/2018 0640   GFRAA >89 12/11/2016 1045   Lipase     Component Value Date/Time   LIPASE 15 03/20/2018 0759       Studies/Results: No results found.  Anti-infectives: Anti-infectives (From admission,  onward)   Start     Dose/Rate Route Frequency Ordered Stop   03/21/18 0930  ceFAZolin (ANCEF) IVPB 2g/100 mL premix     2 g 200 mL/hr over 30 Minutes Intravenous Every 8 hours 03/21/18 0920         Assessment/Plan T2DM on insulin - A1c 02/26/18 12.6  HTN Hx of IVDA - positive for cocaine and opiates on UDS H/o hepatitis C - MCV Ab >11, HCV RNA negative  MSSA bacteremia- abx per primary team, ID Lumbar abscesses, POD 7, s/p I&D 9/23 Dr. Janee Morn -CX: staph aureus pan sensitive, anaerobic cx pending -Continue TID dressing changes and hydrotherapy. -conts to have copious purulent drainage, secondary to communication between spinal abscess at L3-4 interspinous space and the abscesses that were drained. -will check again later this week  Epidural abscess from C2-sacrum -per medicine and NS Endocarditis -per medicine  FEN: CM diet VTE: SCDs/heparin ID: ancef 9/21>>   LOS: 9 days    Letha Cape , [facility] 03/29/2018, 8:50 AM Pager: [phone]

## 2018-03-29 NOTE — Progress Notes (Signed)
Physical Therapy Wound Treatment Patient Details  Name: Tony Long MRN: [id number] Date of Birth: Aug 18, 1975  Today's Date: 03/29/2018 Time: 1100-1145 Time Calculation (min): 45 min  Subjective  Subjective: Pleasant and agreeable to hydrotherapy Patient and Family Stated Goals: Heal wound Prior Treatments: 03/22/18 I&D lumbar wounds  Pain Score:  Pt tolerated treatment well. Reported some pain with packing. RN gave meds prior to session beginning.   Wound Assessment  Wound / Incision (Open or Dehisced) 03/23/18 Incision - Open Lumbar Upper (Active)  Dressing Type ABD;Gauze (Comment);Barrier Film (skin prep);Moist to dry 03/29/2018 12:00 PM  Dressing Changed Changed 03/29/2018 12:00 PM  Dressing Status Clean;Dry;Intact 03/29/2018 12:00 PM  Dressing Change Frequency Daily 03/29/2018 12:00 PM  Site / Wound Assessment Yellow;Pink;Red 03/29/2018 12:00 PM  % Wound base Red or Granulating 75% 03/29/2018 12:00 PM  % Wound base Yellow/Fibrinous Exudate 25% 03/29/2018 12:00 PM  % Wound base Black/Eschar 0% 03/29/2018 12:00 PM  % Wound base Other/Granulation Tissue (Comment) 0% 03/29/2018 12:00 PM  Peri-wound Assessment Intact 03/29/2018 12:00 PM  Wound Length (cm) 3 cm 03/23/2018  1:00 PM  Wound Width (cm) 1.7 cm 03/23/2018  1:00 PM  Wound Depth (cm) 1.4 cm 03/23/2018  1:00 PM  Wound Volume (cm^3) 7.14 cm^3 03/23/2018  1:00 PM  Wound Surface Area (cm^2) 5.1 cm^2 03/23/2018  1:00 PM  Undermining (cm) 3.5cm 11-12 o'clock; 4.1cm 12-1 o'clock; 2.6cm 3'oclock;2.5cm 5 o'clock; 2.9cm 6 o'clock; 2.5cm 7 o'clock 03/23/2018  1:00 PM  Margins Unattached edges (unapproximated) 03/29/2018 12:00 PM  Closure None 03/29/2018 12:00 PM  Drainage Amount Minimal 03/29/2018 12:00 PM  Drainage Description Purulent 03/29/2018 12:00 PM  Treatment Debridement (Selective);Hydrotherapy (Pulse lavage);Packing (Saline gauze) 03/29/2018 12:00 PM     Wound / Incision (Open or Dehisced) 03/23/18 Incision - Open Lumbar Lower (Active)   Dressing Type ABD;Barrier Film (skin prep);Gauze (Comment);Moist to dry 03/29/2018 12:00 PM  Dressing Changed Changed 03/29/2018 12:00 PM  Dressing Status Clean;Dry;Intact 03/29/2018 12:00 PM  Dressing Change Frequency Daily 03/29/2018 12:00 PM  Site / Wound Assessment Other (Comment);Pink;Yellow 03/29/2018 12:00 PM  % Wound base Red or Granulating 30% 03/29/2018 12:00 PM  % Wound base Yellow/Fibrinous Exudate 30% 03/29/2018 12:00 PM  % Wound base Black/Eschar 0% 03/29/2018 12:00 PM  % Wound base Other/Granulation Tissue (Comment) 40% 03/29/2018 12:00 PM  Peri-wound Assessment Pink 03/29/2018 12:00 PM  Wound Length (cm) 3.3 cm 03/23/2018  1:00 PM  Wound Width (cm) 1.8 cm 03/23/2018  1:00 PM  Wound Depth (cm) 1.5 cm 03/23/2018  1:00 PM  Wound Volume (cm^3) 8.91 cm^3 03/23/2018  1:00 PM  Wound Surface Area (cm^2) 5.94 cm^2 03/23/2018  1:00 PM  Undermining (cm) almost 2 cm in all directions on both wounds 03/28/2018  3:40 PM  Margins Unattached edges (unapproximated) 03/29/2018 12:00 PM  Closure None 03/29/2018 12:00 PM  Drainage Amount Copious 03/29/2018 12:00 PM  Drainage Description Purulent 03/29/2018 12:00 PM  Treatment Debridement (Selective);Hydrotherapy (Pulse lavage);Packing (Saline gauze) 03/29/2018 12:00 PM     Hydrotherapy Pulsed lavage therapy - wound location: x2 lumbar wounds Pulsed Lavage with Suction (psi): 12 psi Pulsed Lavage with Suction - Normal Saline Used: 1000 mL Pulsed Lavage Tip: Tip with splash shield Selective Debridement Selective Debridement - Location: x2 lumbar wounds Selective Debridement - Tools Used: Forceps;Scissors Selective Debridement - Tissue Removed: yellow necrotic/unviable tissue   Wound Assessment and Plan  Wound Therapy - Assess/Plan/Recommendations Wound Therapy - Clinical Statement: Inferior wound copiously draining this session with purulent fluid. Pt tolerated treatment well. Pt  continues to benefit from hydrotherapy to reduce bioburden and promote wound  healing.   Wound Therapy - Functional Problem List: Acute pain, decreased tolerance for position changes Factors Delaying/Impairing Wound Healing: Diabetes Mellitus;Infection - systemic/local Hydrotherapy Plan: Debridement;Dressing change;Patient/family education;Pulsatile lavage with suction Wound Therapy - Frequency: 6X / week Wound Therapy - Follow Up Recommendations: Home health RN Wound Plan: See above  Wound Therapy Goals- Improve the function of patient's integumentary system by progressing the wound(s) through the phases of wound healing (inflammation - proliferation - remodeling) by: Decrease Necrotic Tissue to: 0% both wounds Decrease Necrotic Tissue - Progress: Progressing toward goal Increase Granulation Tissue to: 100% both wounds Increase Granulation Tissue - Progress: Progressing toward goal Improve Drainage Characteristics: Min;Serous Improve Drainage Characteristics - Progress: Progressing toward goal Goals/treatment plan/discharge plan were made with and agreed upon by patient/family: Yes Time For Goal Achievement: 7 days Wound Therapy - Potential for Goals: Good  Goals will be updated until maximal potential achieved or discharge criteria met.  Discharge criteria: when goals achieved, discharge from hospital, MD decision/surgical intervention, no progress towards goals, refusal/missing three consecutive treatments without notification or medical reason.  GP     Thelma Comp 03/29/2018, 12:56 PM

## 2018-03-29 NOTE — Progress Notes (Signed)
Inpatient Diabetes Program Recommendations  AACE/ADA: New Consensus Statement on Inpatient Glycemic Control (2015)  Target Ranges:  Prepandial:   less than 140 mg/dL      Peak postprandial:   less than 180 mg/dL (1-2 hours)      Critically ill patients:  140 - 180 mg/dL   Lab Results  Component Value Date   GLUCAP 246 (H) 03/29/2018   HGBA1C 12.6 (A) 02/26/2018    Review of Glycemic ControlResults for Tony Long, Tony Long (MRN [id number]) as of 03/29/2018 13:06  Ref. Range 03/28/2018 12:33 03/28/2018 17:49 03/28/2018 21:46 03/29/2018 07:48 03/29/2018 11:53  Glucose-Capillary Latest Ref Range: 70 - 99 mg/dL 213 (H) 086 (H) 578 (H) 196 (H) 246 (H)   Diabetes history: DM Outpatient Diabetes medications:  Lantus 50 units q HS, Humalog 10 units tid with meals,  Metformin 500 mg bid Current orders for Inpatient glycemic control:  Novolog sensitive tid with meals and HS, Lantus 25 units q HS Inpatient Diabetes Program Recommendations:   May consider adding Novolog 3 units tid with meals (hold if patient eats less than 50%).  Thanks,  Beryl Meager, RN, BC-ADM Inpatient Diabetes Coordinator Pager [phone] (8a-5p)

## 2018-03-30 ENCOUNTER — Inpatient Hospital Stay (HOSPITAL_COMMUNITY): Payer: Medicaid Other

## 2018-03-30 ENCOUNTER — Encounter (HOSPITAL_COMMUNITY): Payer: Self-pay | Admitting: Radiology

## 2018-03-30 DIAGNOSIS — R071 Chest pain on breathing: Secondary | ICD-10-CM

## 2018-03-30 DIAGNOSIS — I10 Essential (primary) hypertension: Secondary | ICD-10-CM

## 2018-03-30 DIAGNOSIS — R531 Weakness: Secondary | ICD-10-CM

## 2018-03-30 DIAGNOSIS — Z8619 Personal history of other infectious and parasitic diseases: Secondary | ICD-10-CM

## 2018-03-30 DIAGNOSIS — F1911 Other psychoactive substance abuse, in remission: Secondary | ICD-10-CM

## 2018-03-30 DIAGNOSIS — A419 Sepsis, unspecified organism: Secondary | ICD-10-CM

## 2018-03-30 DIAGNOSIS — E872 Acidosis: Secondary | ICD-10-CM

## 2018-03-30 DIAGNOSIS — R6521 Severe sepsis with septic shock: Secondary | ICD-10-CM

## 2018-03-30 DIAGNOSIS — Z95828 Presence of other vascular implants and grafts: Secondary | ICD-10-CM

## 2018-03-30 LAB — URINALYSIS, ROUTINE W REFLEX MICROSCOPIC
BILIRUBIN URINE: NEGATIVE
Bacteria, UA: NONE SEEN
GLUCOSE, UA: 50 mg/dL — AB
KETONES UR: NEGATIVE mg/dL
NITRITE: NEGATIVE
PROTEIN: NEGATIVE mg/dL
Specific Gravity, Urine: 1.009 (ref 1.005–1.030)
pH: 6 (ref 5.0–8.0)

## 2018-03-30 LAB — CBC
HCT: 28.6 % — ABNORMAL LOW (ref 39.0–52.0)
HCT: 24.3 % — ABNORMAL LOW (ref 39.0–52.0)
Hemoglobin: 9.2 g/dL — ABNORMAL LOW (ref 13.0–17.0)
Hemoglobin: 7.6 g/dL — ABNORMAL LOW (ref 13.0–17.0)
MCH: 30.8 pg (ref 26.0–34.0)
MCH: 30.8 pg (ref 26.0–34.0)
MCHC: 32.2 g/dL (ref 30.0–36.0)
MCHC: 31.3 g/dL (ref 30.0–36.0)
MCV: 95.7 fL (ref 78.0–100.0)
MCV: 98.4 fL (ref 78.0–100.0)
PLATELETS: 365 10*3/uL (ref 150–400)
Platelets: 382 10*3/uL (ref 150–400)
RBC: 2.99 MIL/uL — ABNORMAL LOW (ref 4.22–5.81)
RBC: 2.47 MIL/uL — ABNORMAL LOW (ref 4.22–5.81)
RDW: 13 % (ref 11.5–15.5)
RDW: 13.2 % (ref 11.5–15.5)
WBC: 14.5 10*3/uL — ABNORMAL HIGH (ref 4.0–10.5)
WBC: 25.2 10*3/uL — ABNORMAL HIGH (ref 4.0–10.5)

## 2018-03-30 LAB — BASIC METABOLIC PANEL
Anion gap: 11 (ref 5–15)
BUN: 6 mg/dL (ref 6–20)
CO2: 34 mmol/L — ABNORMAL HIGH (ref 22–32)
Calcium: 8.5 mg/dL — ABNORMAL LOW (ref 8.9–10.3)
Chloride: 90 mmol/L — ABNORMAL LOW (ref 98–111)
Creatinine, Ser: 0.68 mg/dL (ref 0.61–1.24)
GFR calc Af Amer: >60 mL/min (ref >60)
GFR calc non Af Amer: >60 mL/min (ref >60)
Glucose, Bld: 230 mg/dL — ABNORMAL HIGH (ref 70–99)
Potassium: 2.8 mmol/L — ABNORMAL LOW (ref 3.5–5.1)
Sodium: 135 mmol/L (ref 135–145)

## 2018-03-30 LAB — RAPID URINE DRUG SCREEN, HOSP PERFORMED
Amphetamines: NOT DETECTED
Barbiturates: NOT DETECTED
Benzodiazepines: NOT DETECTED
Cocaine: NOT DETECTED
Opiates: NOT DETECTED
Tetrahydrocannabinol: NOT DETECTED

## 2018-03-30 LAB — GLUCOSE, CAPILLARY
GLUCOSE-CAPILLARY: 134 mg/dL — AB (ref 70–99)
GLUCOSE-CAPILLARY: 301 mg/dL — AB (ref 70–99)
Glucose-Capillary: 176 mg/dL — ABNORMAL HIGH (ref 70–99)
Glucose-Capillary: 154 mg/dL — ABNORMAL HIGH (ref 70–99)

## 2018-03-30 LAB — MAGNESIUM: MAGNESIUM: 1.6 mg/dL — AB (ref 1.7–2.4)

## 2018-03-30 LAB — ABO/RH: ABO/RH(D): O POS

## 2018-03-30 LAB — ECHOCARDIOGRAM COMPLETE
HEIGHTINCHES: 71 in
Weight: 2400.02 oz

## 2018-03-30 LAB — PROCALCITONIN: PROCALCITONIN: 0.39 ng/mL

## 2018-03-30 LAB — LACTIC ACID, PLASMA
Lactic Acid, Venous: 1.1 mmol/L (ref 0.5–1.9)
Lactic Acid, Venous: 1.2 mmol/L (ref 0.5–1.9)

## 2018-03-30 LAB — TYPE AND SCREEN
ABO/RH(D): O POS
Antibody Screen: NEGATIVE

## 2018-03-30 LAB — TROPONIN I

## 2018-03-30 LAB — CORTISOL: Cortisol, Plasma: 65.8 ug/dL

## 2018-03-30 MED ORDER — IOPAMIDOL (ISOVUE-370) INJECTION 76%
INTRAVENOUS | Status: AC
  Filled 2018-03-30: qty 100

## 2018-03-30 MED ORDER — IOPAMIDOL (ISOVUE-370) INJECTION 76%
100.0000 mL | Freq: Once | INTRAVENOUS | Status: AC | PRN
  Administered 2018-03-30: 54 mL via INTRAVENOUS

## 2018-03-30 MED ORDER — ORAL CARE MOUTH RINSE
15.0000 mL | Freq: Two times a day (BID) | OROMUCOSAL | Status: DC
  Administered 2018-03-30 – 2018-04-01 (×4): 15 mL via OROMUCOSAL

## 2018-03-30 MED ORDER — DIPHENHYDRAMINE HCL 50 MG/ML IJ SOLN
INTRAMUSCULAR | Status: AC
  Filled 2018-03-30: qty 1

## 2018-03-30 MED ORDER — ISOSORBIDE MONONITRATE ER 60 MG PO TB24
60.0000 mg | ORAL_TABLET | Freq: Every day | ORAL | Status: DC
  Administered 2018-03-30: 60 mg via ORAL
  Filled 2018-03-30: qty 1

## 2018-03-30 MED ORDER — METOCLOPRAMIDE HCL 5 MG/ML IJ SOLN
10.0000 mg | Freq: Once | INTRAMUSCULAR | Status: AC
  Administered 2018-03-30: 10 mg via INTRAVENOUS
  Filled 2018-03-30: qty 2

## 2018-03-30 MED ORDER — VANCOMYCIN HCL IN DEXTROSE 750-5 MG/150ML-% IV SOLN
750.0000 mg | Freq: Three times a day (TID) | INTRAVENOUS | Status: DC
  Administered 2018-03-31 – 2018-04-01 (×4): 750 mg via INTRAVENOUS
  Filled 2018-03-30 (×6): qty 150

## 2018-03-30 MED ORDER — DIPHENHYDRAMINE HCL 50 MG/ML IJ SOLN
25.0000 mg | Freq: Once | INTRAMUSCULAR | Status: AC
  Administered 2018-03-30: 25 mg via INTRAVENOUS

## 2018-03-30 MED ORDER — LORAZEPAM 2 MG/ML IJ SOLN
2.0000 mg | Freq: Once | INTRAMUSCULAR | Status: DC
  Filled 2018-03-30: qty 1

## 2018-03-30 MED ORDER — NOREPINEPHRINE 4 MG/250ML-% IV SOLN
0.0000 ug/min | INTRAVENOUS | Status: DC
  Filled 2018-03-30: qty 250

## 2018-03-30 MED ORDER — METOPROLOL TARTRATE 50 MG PO TABS: 50.0000 mg | ORAL_TABLET | Freq: Two times a day (BID) | ORAL | Status: DC

## 2018-03-30 MED ORDER — KETOROLAC TROMETHAMINE 30 MG/ML IJ SOLN
30.0000 mg | Freq: Once | INTRAMUSCULAR | Status: AC
  Administered 2018-03-30: 30 mg via INTRAVENOUS
  Filled 2018-03-30: qty 1

## 2018-03-30 MED ORDER — METOPROLOL TARTRATE 100 MG PO TABS: 100.0000 mg | ORAL_TABLET | Freq: Two times a day (BID) | ORAL | Status: DC

## 2018-03-30 MED ORDER — POTASSIUM CHLORIDE CRYS ER 20 MEQ PO TBCR
40.0000 meq | EXTENDED_RELEASE_TABLET | Freq: Four times a day (QID) | ORAL | Status: AC
  Administered 2018-03-30 (×2): 40 meq via ORAL
  Filled 2018-03-30 (×2): qty 2

## 2018-03-30 MED ORDER — SODIUM CHLORIDE 0.9 % IV BOLUS
1000.0000 mL | Freq: Once | INTRAVENOUS | Status: AC
  Administered 2018-03-30: 1000 mL via INTRAVENOUS

## 2018-03-30 MED ORDER — MAGNESIUM SULFATE 2 GM/50ML IV SOLN
2.0000 g | Freq: Once | INTRAVENOUS | Status: AC
  Administered 2018-03-30: 2 g via INTRAVENOUS
  Filled 2018-03-30: qty 50

## 2018-03-30 MED ORDER — NOREPINEPHRINE 4 MG/250ML-% IV SOLN: 0.0000 ug/min | INTRAVENOUS | Status: DC

## 2018-03-30 MED ORDER — SODIUM CHLORIDE 0.9 % IV SOLN
INTRAVENOUS | Status: DC
  Administered 2018-03-30 – 2018-03-31 (×4): via INTRAVENOUS

## 2018-03-30 MED ORDER — HYDROCORTISONE NA SUCCINATE PF 100 MG IJ SOLR
100.0000 mg | Freq: Three times a day (TID) | INTRAMUSCULAR | Status: DC
  Administered 2018-03-30 – 2018-04-01 (×5): 100 mg via INTRAVENOUS
  Filled 2018-03-30 (×5): qty 2

## 2018-03-30 MED ORDER — ATORVASTATIN CALCIUM 40 MG PO TABS
40.0000 mg | ORAL_TABLET | Freq: Every day | ORAL | Status: DC
  Administered 2018-03-30 – 2018-04-16 (×18): 40 mg via ORAL
  Filled 2018-03-30 (×19): qty 1

## 2018-03-30 MED ORDER — ASPIRIN 81 MG PO CHEW
81.0000 mg | CHEWABLE_TABLET | Freq: Every day | ORAL | Status: DC
  Administered 2018-03-30 – 2018-04-17 (×19): 81 mg via ORAL
  Filled 2018-03-30 (×19): qty 1

## 2018-03-30 MED ORDER — NITROGLYCERIN 0.4 MG SL SUBL
0.4000 mg | SUBLINGUAL_TABLET | SUBLINGUAL | Status: DC | PRN
  Administered 2018-03-30: 0.4 mg via SUBLINGUAL

## 2018-03-30 MED ORDER — VANCOMYCIN HCL 10 G IV SOLR
1500.0000 mg | INTRAVENOUS | Status: AC
  Administered 2018-03-30: 1500 mg via INTRAVENOUS
  Filled 2018-03-30: qty 1500

## 2018-03-30 MED ORDER — SODIUM CHLORIDE 0.9 % IV SOLN
1.0000 g | Freq: Three times a day (TID) | INTRAVENOUS | Status: DC
  Administered 2018-03-31 – 2018-04-01 (×5): 1 g via INTRAVENOUS
  Filled 2018-03-30 (×7): qty 1

## 2018-03-30 MED ORDER — NITROGLYCERIN 0.4 MG SL SUBL
SUBLINGUAL_TABLET | SUBLINGUAL | Status: AC
  Filled 2018-03-30: qty 1

## 2018-03-30 MED ORDER — SODIUM CHLORIDE 0.9 % IV SOLN
2.0000 g | INTRAVENOUS | Status: AC
  Administered 2018-03-30: 2 g via INTRAVENOUS
  Filled 2018-03-30: qty 2

## 2018-03-30 MED ORDER — METOPROLOL TARTRATE 50 MG PO TABS
50.0000 mg | ORAL_TABLET | Freq: Once | ORAL | Status: DC
  Filled 2018-03-30: qty 1

## 2018-03-30 MED ORDER — POTASSIUM CHLORIDE 10 MEQ/100ML IV SOLN
10.0000 meq | INTRAVENOUS | Status: AC
  Administered 2018-03-30 (×2): 10 meq via INTRAVENOUS
  Filled 2018-03-30 (×2): qty 100

## 2018-03-30 MED ORDER — ACETAMINOPHEN 325 MG PO TABS
650.0000 mg | ORAL_TABLET | Freq: Four times a day (QID) | ORAL | Status: DC | PRN
  Administered 2018-03-30 – 2018-04-09 (×4): 650 mg via ORAL
  Filled 2018-03-30 (×4): qty 2

## 2018-03-30 NOTE — Progress Notes (Signed)
Orders received for medications, chest xray, and lab work.  All orders carried out and patient has pain rated at 5/5.  Cardiology notified and will be following up.  Patient not resting in bed.  Will continue to monitor.

## 2018-03-30 NOTE — Progress Notes (Signed)
Obtained cervical without and partial thoracic MRI. Pt refused meds to help him relax to finish MRI.

## 2018-03-30 NOTE — Progress Notes (Signed)
Patient complaining of left side chest pain, described as pressure.  Patient vitals within his normal limits, lung sounds, clear but diminished no changes from previous assessment.  Patient advised to notify me if it persisted or worsened.  Will continue to monitor.

## 2018-03-30 NOTE — Progress Notes (Signed)
INFECTIOUS DISEASE PROGRESS NOTE  ID: Tony Long is a 42 y.o. male with  Principal Problem:   Opioid use disorder, severe, dependence (HCC) Active Problems:   Non compliance w medication regimen   Chronic hepatitis C without hepatic coma (HCC)   Osteomyelitis (HCC)   Back abscess   Bacteremia due to Gram-positive bacteria   Chest pain on breathing  Subjective: 42 yo M with epidural abscess from C2-sacrum. He also had MSSA bacteremia. Prior IVDA. He was last seen by ID on 9-25 and was doing well. He was continued on ancef, end date 05-25-18.  Over last 24h he has had temp up to 103 and become hypotensive. He had mental status change this AM, this PM back to baseline.  WBC improving to 14.5 (peak 26.3 on 9-22).  He also has new L leg weakness.    Abtx:  Anti-infectives (From admission, onward)   Start     Dose/Rate Route Frequency Ordered Stop   03/30/18 1630  vancomycin (VANCOCIN) 1,500 mg in sodium chloride 0.9 % 500 mL IVPB     1,500 mg 250 mL/hr over 120 Minutes Intravenous NOW 03/30/18 1619 03/31/18 1630   03/30/18 1630  meropenem (MERREM) 2 g in sodium chloride 0.9 % 100 mL IVPB     2 g 200 mL/hr over 30 Minutes Intravenous NOW 03/30/18 1619 03/31/18 1630   03/21/18 0930  ceFAZolin (ANCEF) IVPB 2g/100 mL premix  Status:  Discontinued     2 g 200 mL/hr over 30 Minutes Intravenous Every 8 hours 03/21/18 0920 03/30/18 1616      Medications:  Scheduled: . aspirin  81 mg Oral Daily  . atorvastatin  40 mg Oral q1800  . buprenorphine-naloxone  1 tablet Sublingual Daily  . gabapentin  300 mg Oral TID  . heparin injection (subcutaneous)  5,000 Units Subcutaneous Q8H  . hydrocortisone sod succinate (SOLU-CORTEF) inj  100 mg Intravenous Q8H  . insulin aspart  0-5 Units Subcutaneous QHS  . insulin aspart  0-9 Units Subcutaneous TID WC  . insulin glargine  25 Units Subcutaneous QHS  . iopamidol      . metoprolol tartrate  50 mg Oral Once  . [START ON 03/31/2018]  metoprolol tartrate  50 mg Oral BID  . nicotine  21 mg Transdermal Daily  . polyethylene glycol  17 g Oral BID  . senna-docusate  2 tablet Oral QHS  . sodium chloride flush  10-40 mL Intracatheter Q12H  . tamsulosin  0.4 mg Oral Daily    Objective: Vital signs in last 24 hours: Temp:  [98.3 F (36.8 C)-100.1 F (37.8 C)] 100.1 F (37.8 C) (10/01 1534) Pulse Rate:  [88-117] 88 (10/01 1255) Resp:  [14-18] 18 (10/01 0449) BP: (72-135)/(36-82) 72/40 (10/01 1614) SpO2:  [87 %-100 %] 87 % (10/01 1534)   General appearance: alert, no distress and pale Resp: clear to auscultation bilaterally Cardio: tachycardia GI: normal findings: bowel sounds normal and soft, non-tender Extremities: extr cool Neurologic: Motor: he is unable to lift his L leg off the bed.  LUE PIC is clean, non-tender.   Lab Results Recent Labs    03/30/18 0227  WBC 14.5*  HGB 9.2*  HCT 28.6*  NA 135  K 2.8*  CL 90*  CO2 34*  BUN 6  CREATININE 0.68   Liver Panel No results for input(s): PROT, ALBUMIN, AST, ALT, ALKPHOS, BILITOT, BILIDIR, IBILI in the last 72 hours. Sedimentation Rate No results for input(s): ESRSEDRATE in the last 72 hours. C-Reactive  Protein No results for input(s): CRP in the last 72 hours.  Microbiology: Recent Results (from the past 240 hour(s))  Anaerobic culture     Status: None   Collection Time: 03/22/18 10:36 AM  Result Value Ref Range Status   Specimen Description ABSCESS BACK  Final   Special Requests NONE  Final   Gram Stain   Final    RARE WBC PRESENT, PREDOMINANTLY MONONUCLEAR ABUNDANT GRAM POSITIVE COCCI    Culture   Final    NO ANAEROBES ISOLATED Performed at [facility], [address], [place], [place] [address]    Report Status 03/27/2018 FINAL  Final  Aerobic Culture (superficial specimen)     Status: None   Collection Time: 03/22/18 10:36 AM  Result Value Ref Range Status   Specimen Description ABSCESS BACK  Final   Special Requests NONE   Final   Gram Stain   Final    FEW WBC PRESENT,BOTH PMN AND MONONUCLEAR MODERATE GRAM POSITIVE COCCI Performed at [facility], [address], [place], [place] [address]    Culture ABUNDANT STAPHYLOCOCCUS AUREUS  Final   Report Status 03/24/2018 FINAL  Final   Organism ID, Bacteria STAPHYLOCOCCUS AUREUS  Final      Susceptibility   Staphylococcus aureus - MIC*    CIPROFLOXACIN <=0.5 SENSITIVE Sensitive     ERYTHROMYCIN <=0.25 SENSITIVE Sensitive     GENTAMICIN <=0.5 SENSITIVE Sensitive     OXACILLIN 0.5 SENSITIVE Sensitive     TETRACYCLINE <=1 SENSITIVE Sensitive     VANCOMYCIN 1 SENSITIVE Sensitive     TRIMETH/SULFA <=10 SENSITIVE Sensitive     CLINDAMYCIN <=0.25 SENSITIVE Sensitive     RIFAMPIN <=0.5 SENSITIVE Sensitive     Inducible Clindamycin NEGATIVE Sensitive     * ABUNDANT STAPHYLOCOCCUS AUREUS  Culture, blood (Routine X 2) w Reflex to ID Panel     Status: None   Collection Time: 03/23/18  4:39 AM  Result Value Ref Range Status   Specimen Description BLOOD RIGHT HAND  Final   Special Requests   Final    BOTTLES DRAWN AEROBIC ONLY Blood Culture adequate volume   Culture   Final    NO GROWTH 5 DAYS Performed at [facility], [address] [place], [place] [address]    Report Status 03/28/2018 FINAL  Final    Studies/Results: Dg Chest Port 1 View  Result Date: 03/30/2018 CLINICAL DATA:  Chest pain EXAM: PORTABLE CHEST 1 VIEW COMPARISON:  None. FINDINGS: Retrocardiac opacity, atelectasis versus pneumonia. Possible small left pleural effusion. Right lung is clear. No pneumothorax. The heart is normal in size. IMPRESSION: Retrocardiac opacity, atelectasis versus pneumonia. Possible small left pleural effusion. Electronically Signed   By: Charline Bills M.D.   On: 03/30/2018 02:49     Assessment/Plan: C-Sacrum abscess New onset weakness Previous MSSA bacteremia TV IE Previous IVDA  Total days of antibiotics: 10 ancef --> vanco/merrem.   Change anbx  to vanco/merrem to cover for potential PIC infection.  I suspect that he has developed worsening of his spinal abscess He will need repeat scan of this when he is stable.  He is being seen by neurosurgery.  He is being seen by CCM as well Would consider CTA of his aorta.           Johny Sax MD, FACP Infectious Diseases (pager) [phone] [email or web address] 03/30/2018, 4:25 PM  LOS: 10 days

## 2018-03-30 NOTE — Progress Notes (Signed)
Patient called me to the room with worsening chest pain, described as pressure, with worsening pain when taking a deep breath. Pain rated 10/10.  Oxygen placed on patient, EKG performed and rapid response called.  Notified physician and rapid and physician came to bed side.

## 2018-03-30 NOTE — Consult Note (Addendum)
NAME:  Tony Long, MRN:  [id number], DOB:  1975-07-25, LOS: 10 ADMISSION DATE:  03/20/2018, CONSULTATION DATE:  03/30/18 REFERRING MD:  Thedore Mins CHIEF COMPLAINT:  Hypotension   Brief History   Tony Long is a 42 y.o. male with a PMH of IVDA, admitted 03/20/18 with back pain. He was found to have MSSA bacteremia with TV endocarditis and epidural abscess involving C2 through sacrum along with numerous soft tissue back abscesses.  He underwent I&D of lower back soft tissue abscesses by general surgery.  He was evaluated by neurosurgery who deemed pt to not be a surgical candidate due to extensive nature of the disease and lack of significant neurological findings.  He has been followed by ID who plans to continue Ancef through 05/25/18.  On 10/1, he developed left leg weakness and hypotension after hydrotherapy session and after he had received 60mg  Imdur and 50mg  Metoprolol that morning; however, there was concern for possible anterior spinal artery infarct.  He was subsequently transferred to the ICU and PCCM was asked to see in consultation.  Neurosurgery has recommended repeat MRI of C/T/L spine.  CTA chest is also pending given chest pain that developed 9/30 (? Septic emboli).  Significant Hospital Events   9/21 > admit. 9/23 > I&D of back abscess. 9/30 > left sided chest pain that responded somewhat to toradol. 10/1 > hypotension after receiving 60mg  Imdur, left leg weakness and concern for anterior spinal artery infarct; therefore, transferred to ICU.  Consults: date of consult/date signed off & final recs:  Neurosurgery 9/21 > supportive care with no plans for surgery. General surgery 9/22 > I&D of abscesses 9/23. IR 9/22 > nothing further after I&D by general surgery. ID 9/22 > prolonged IV abx. Psych 9/22 > no evidence of psych diagnosis other than substance abuse.  Procedures (surgical and bedside):  9/22 > I&D of large back abscess x 2.  Significant Diagnostic Tests:  CT  T/L spine 9/21 > cellulitis right base of neck with fluid and gas, extensive back subcutaneous abscess and myositis, moth eaten appearance of L3 and L4 c/w osteo. MRI C/T/L spine 9/23 > epidural abscess from C2 to sacrum with maximal cord compression from T3-6, at L3-4 abscess communicates with large bilateral abscesses within the intrinsic back muscles, osteo at L2-L4, left paravertebral abscess along the lower thoracic spine with small left empyema. TEE 9/23 > EF 55%, 1.2cm TV endocarditis with minimal regurg. Echo 10/1 >   Micro Data: Back abscess 9/23 > MSSA (pan sensitive).  Antimicrobials:  Ancef 9/22 >  Merrem 10/1 > 10/2 Vanc 10/1 > 10/2   Subjective:  Awake, has left leg weakness but can now move it minimally.  Can not lift or hold leg up.  Objective   Blood pressure (!) 85/45, pulse 87, temperature 100.1 F (37.8 C), temperature source Oral, resp. rate 16, height 5\' 11"  (1.803 m), weight 68 kg, SpO2 94 %.        Intake/Output Summary (Last 24 hours) at 03/30/2018 1805 Last data filed at 03/30/2018 0924 Gross per 24 hour  Intake 450 ml  Output 3900 ml  Net -3450 ml   Filed Weights   03/21/18 0602  Weight: 68 kg    Examination: General: Young caucasian male, resting in bed, in NAD. Neuro: A&O x 3, left leg weakness. HEENT: [place]/AT. Sclerae anicteric, EOMI. Cardiovascular: RRR, 2/6 SEM. Lungs: Respirations even and unlabored.  CTA bilaterally, No W/R/R. Abdomen: BS x 4, soft, NT/ND.  Musculoskeletal: No gross deformities, no  edema.  Skin: Multiple tattoos, warm, no rashes.  Assessment & Plan:   MSSA bacteremia with TV endocarditis. C2 - sacral abscess. Large soft tissue lower back abscesses - s/p I&D 9/23. - Repeat STAT MRI C/T/L spine per neurosurgery. - ID, neurosurgery, general surgery following. - Continue ancef (stop date 05/25/18). - Continue wound care / hydrotherapy.  Left sided chest pain  - somewhat resolved, but still persists and worse with deep  inspiration.  Question septic emboli given MSSA endocarditis.  Troponin and EKG negative. - CTA chest while going down for further spinal imaging. - F/u on echo.  Hypotension - exacerbated after receiving Imdur and Metoprolol. - Hold all antihypertensives. - Continue IVF's. - OK to continue stress steroids for now, d/c if cortisol > 20.  DM. - Continue SSI and lantus.  Hypokalemia - s/p repletion. Hypomagnesemia - s/p repletion. - F/u BMP.  Polysubstance abuse. - Continue suboxone. - Cessation counseling.   Disposition / Summary of Today's Plan 03/30/18   Maintain in ICU tonight. Continue fluids.  If remains stable overnight, transfer out of ICU in AM.    Diet: Carb Mod. Pain/Anxiety/Delirium protocol (if indicated): N/A. VAP protocol (if indicated): N/A. DVT prophylaxis: SCD's / heparin. GI prophylaxis: N/A. Hyperglycemia protocol: SSI and lantus. Mobility: Bedrest. Code Status: Full. Family Communication: Family updated by Dr. Molli Knock.  Labs   CBC: Recent Labs  Lab 03/24/18 0434 03/26/18 0640 03/30/18 0227  WBC 16.7* 15.4* 14.5*  HGB 11.4* 10.2* 9.2*  HCT 35.7* 31.4* 28.6*  MCV 97.3 95.2 95.7  PLT 293 289 382   Basic Metabolic Panel: Recent Labs  Lab 03/24/18 0434 03/26/18 0640 03/30/18 0227  NA 140 138 135  K 2.9* 3.5 2.8*  CL 104 103 90*  CO2 24 22 34*  GLUCOSE 174* 319* 230*  BUN 17 15 6   CREATININE 0.64 1.08 0.68  CALCIUM 9.3 8.9 8.5*  MG 1.9  --  1.6*   GFR: Estimated Creatinine Clearance: 116.9 mL/min (by C-G formula based on SCr of 0.68 mg/dL). Recent Labs  Lab 03/24/18 0434 03/26/18 0640 03/30/18 0227 03/30/18 1620  PROCALCITON  --   --   --  0.39  WBC 16.7* 15.4* 14.5*  --   LATICACIDVEN  --   --   --  1.1   Liver Function Tests: Recent Labs  Lab 03/24/18 0434  AST 17  ALT 9  ALKPHOS 121  BILITOT 0.5  PROT 6.6  ALBUMIN 1.9*   No results for input(s): LIPASE, AMYLASE in the last 168 hours. No results for input(s):  AMMONIA in the last 168 hours. ABG    Component Value Date/Time   HCO3 28.5 (H) 02/19/2016 1532   TCO2 27 02/19/2016 1743   O2SAT 100.0 02/19/2016 1532    Coagulation Profile: No results for input(s): INR, PROTIME in the last 168 hours. Cardiac Enzymes: Recent Labs  Lab 03/30/18 0219 03/30/18 1222  TROPONINI <0.03 <0.03   HbA1C: Hemoglobin A1C  Date/Time Value Ref Range Status  02/26/2018 12:01 PM 12.6 (A) 4.0 - 5.6 % Final  10/14/2017 03:57 PM 12.8  Final   Hgb A1c MFr Bld  Date/Time Value Ref Range Status  06/02/2016 03:39 PM 10.1 (H) <5.7 % Final    Comment:      For someone without known diabetes, a hemoglobin A1c value of 6.5% or greater indicates that they may have diabetes and this should be confirmed with a follow-up test.   For someone with known diabetes, a value <7% indicates that  their diabetes is well controlled and a value greater than or equal to 7% indicates suboptimal control. A1c targets should be individualized based on duration of diabetes, age, comorbid conditions, and other considerations.   Currently, no consensus exists for use of hemoglobin A1c for diagnosis of diabetes for children.      CBG: Recent Labs  Lab 03/29/18 1153 03/29/18 1657 03/29/18 2158 03/30/18 0746 03/30/18 1152  GLUCAP 246* 225* 369* 134* 176*    Admitting History of Present Illness.   Tony Long is a 42 y.o. male who has a PMH of IVDA, admitted 03/20/18 with back pain. He was found to have MSSA bacteremia with TV endocarditis and epidural abscess involving C2 through sacrum along with numerous soft tissue back abscesses.  He underwent I&D of lower back soft tissue abscesses by general surgery.  He was evaluated by neurosurgery who deemed pt to not be a surgical candidate due to extensive nature of the disease and lack of significant neurological findings.  He has been followed by ID who plans to continue Ancef through 05/25/18.  On 10/1, he developed left leg  weakness and hypotension after hydrotherapy session and after he had received 60mg  Imdur and 50mg  Metoprolol that morning; however, there was concern for possible anterior spinal artery infarct.  He was subsequently transferred to the ICU and PCCM was asked to see in consultation.  Neurosurgery has recommended repeat MRI of C/T/L spine.  CTA chest is also pending given chest pain that developed 9/30 (? Septic emboli).  Review of Systems:   All negative; except for those that are bolded, which indicate positives.  Constitutional: weight loss, weight gain, night sweats, fevers, chills, fatigue, weakness.  HEENT: headaches, sore throat, sneezing, nasal congestion, post nasal drip, difficulty swallowing, tooth/dental problems, visual complaints, visual changes, ear aches. Neuro: difficulty with speech, weakness of LLE (unable to lift without assistance), numbness, ataxia. CV:  chest pain, orthopnea, PND, swelling in lower extremities, dizziness, palpitations, syncope.  Resp: cough, hemoptysis, dyspnea, wheezing. GI: heartburn, indigestion, abdominal pain, nausea, vomiting, diarrhea, constipation, change in bowel habits, loss of appetite, hematemesis, melena, hematochezia.  GU: dysuria, change in color of urine, urgency or frequency, flank pain, hematuria. MSK: joint pain or swelling, decreased range of motion. Psych: change in mood or affect, depression, anxiety, suicidal ideations, homicidal ideations. Skin: rash, itching, bruising.   Past medical history  He,  has a past medical history of Diabetes mellitus without complication (HCC).   Surgical History    Past Surgical History:  Procedure Laterality Date  . INCISION AND DRAINAGE ABSCESS N/A 03/22/2018   Procedure: INCISION AND DRAINAGE ABSCESS;  Surgeon: Violeta Gelinas, MD;  Location[facility] OR;  Service: General;  Laterality: N/A;  . TEE WITHOUT CARDIOVERSION  03/22/2018   Procedure: TRANSESOPHAGEAL ECHOCARDIOGRAM (TEE);  Surgeon:  Radiologist, Medication, MD;  Location: [facility] OR;  Service: Open Heart Surgery;;     Social History   Social History   Socioeconomic History  . Marital status: Single    Spouse name: Not on file  . Number of children: Not on file  . Years of education: Not on file  . Highest education level: Not on file  Occupational History  . Not on file  Social Needs  . Financial resource strain: Not on file  . Food insecurity:    Worry: Not on file    Inability: Not on file  . Transportation needs:    Medical: Not on file    Non-medical: Not on file  Tobacco  Use  . Smoking status: Current Every Day Smoker    Packs/day: 1.00  . Smokeless tobacco: Never Used  Substance and Sexual Activity  . Alcohol use: Not Currently    Comment: seldom  . Drug use: No    Comment: pt mother said hx of use  . Sexual activity: Yes    Partners: Female  Lifestyle  . Physical activity:    Days per week: Not on file    Minutes per session: Not on file  . Stress: Not on file  Relationships  . Social connections:    Talks on phone: Not on file    Gets together: Not on file    Attends religious service: Not on file    Active member of club or organization: Not on file    Attends meetings of clubs or organizations: Not on file    Relationship status: Not on file  . Intimate partner violence:    Fear of current or ex partner: Not on file    Emotionally abused: Not on file    Physically abused: Not on file    Forced sexual activity: Not on file  Other Topics Concern  . Not on file  Social History Narrative  . Not on file  ,  reports that he has been smoking. He has been smoking about 1.00 pack per day. He has never used smokeless tobacco. He reports that he drank alcohol. He reports that he does not use drugs.   Family history   His family history includes Diabetes in his father and paternal uncle; Heart disease in his father.   Allergies No Known Allergies  Home meds  Prior to Admission medications    Medication Sig Start Date End Date Taking? Authorizing Provider  gabapentin (NEURONTIN) 300 MG capsule Take 1 capsule (300 mg total) by mouth 3 (three) times daily. 02/26/18  Yes [name], FNP  Insulin Glargine (LANTUS) 100 UNIT/ML Solostar Pen Inject 50 Units into the skin daily at 10 pm. Patient taking differently: Inject 40 Units into the skin at bedtime.  02/26/18  Yes [name], FNP  insulin lispro (HUMALOG) 100 UNIT/ML injection Inject 0.1 mLs (10 Units total) into the skin 3 (three) times daily with meals. 03/04/18  Yes [name], FNP  cyclobenzaprine (FLEXERIL) 5 MG tablet Take 1-2 tablets (5-10 mg total) by mouth 3 (three) times daily as needed for muscle spasms. Patient not taking: Reported on 03/20/2018 03/07/18   Evon Slack, PA-C  etodolac (LODINE) 500 MG tablet Take 1 tablet (500 mg total) by mouth 2 (two) times daily. Patient not taking: Reported on 03/20/2018 03/07/18   Amador Cunas C, PA-C  glucose blood test strip Use as instructed 03/13/16   Henrietta Hoover, NP  HYDROcodone-acetaminophen (NORCO) 5-325 MG tablet Take 1 tablet by mouth every 6 (six) hours as needed for moderate pain. Patient not taking: Reported on 03/20/2018 03/07/18   Evon Slack, PA-C  ibuprofen (ADVIL,MOTRIN) 600 MG tablet Take 1 tablet (600 mg total) by mouth every 8 (eight) hours as needed. Patient not taking: Reported on 02/26/2018 10/14/17   [name], FNP  Lancets [facility] ULTRASOFT) lancets Use as instructed 03/13/16   Henrietta Hoover, NP  LANTUS 100 UNIT/ML injection INJECT 40 UNITS TOTAL INTO THE SKIN AT BEDTIME. Patient not taking: Reported on 03/20/2018 03/04/18   [name], FNP  lisinopril (PRINIVIL,ZESTRIL) 5 MG tablet Take 1 tablet (5 mg total) by mouth daily. Patient not taking:  Reported on 02/26/2018 10/14/17   [name], FNP  metFORMIN (GLUCOPHAGE) 500 MG tablet Take 1 tablet (500 mg total) by mouth 2 (two) times daily with a meal. Patient not  taking: Reported on 10/14/2017 12/11/16   Bing Neighbors, FNP  pravastatin (PRAVACHOL) 40 MG tablet Take 1 tablet (40 mg total) by mouth daily. Patient not taking: Reported on 12/11/2016 06/02/16   Henrietta Hoover, NP  sildenafil (VIAGRA) 25 MG tablet Take 1 tablet (25 mg total) by mouth daily as needed for erectile dysfunction. Patient not taking: Reported on 03/20/2018 02/26/18   [name], FNP     Rutherford Guys, PA - [facility] Pager: [phone]  or [phone] 03/30/2018, 6:05 PM  Attending Note:  42 year old male with PMH of IVDA presenting with endocarditis and and massive epidural abscess.  Patient has been in the hospital for multiple days with evidence of deterioration and multiple services evaluated the patient and he is not a surgical candidate.  Recommendations now is strictly medical management.  On the day of transfer to the ICU the patient starting having septic shock concerns with hypotension that was not responsive to IVF and evidence of leg weakness.  Patient was transferred to the ICU for pressors and closer monitoring given neurologic deterioration.  On exam, lungs are clear with minor bibasilar crackles.  I reviewed CXR myself, infiltrate and pleural effusion noted.  I am concerned that patient has little chance of surviving this and will likely deteriorate.  Abx broadened, IVF added.  I had an extensive conversation with the family.  They clearly have no idea what the disease process is and the prognosis.    I gathered the entire family, and explained the entire case to them and treatment options and what is considered realistic expectations.  After a long discussion, decision was made that pressors are acceptable and including central access.  However, no intubation, CPR, cardioversion or dialysis.   Will continue hydration for now.  Continue abx.  Confer with ID.  Appreciate input from NS.  Continue hydrotherapy.  PCCM will  continue to follow.  The patient is critically ill with multiple organ systems failure and requires high complexity decision making for assessment and support, frequent evaluation and titration of therapies, application of advanced monitoring technologies and extensive interpretation of multiple databases.   Critical Care Time devoted to patient care services described in this note is  45  Minutes. This time reflects time of care of this signee Dr Koren Bound. This critical care time does not reflect procedure time, or teaching time or supervisory time of PA/NP/Med student/Med Resident etc but could involve care discussion time.  Alyson Reedy, M.D. [facility] Pulmonary/Critical Care Medicine. Pager: [phone]. After hours pager: [phone].

## 2018-03-30 NOTE — Progress Notes (Signed)
Was verbally told by the day shift physician that LCB order would be placed in patient's chart. Patient stated to RN he "wants to live" and "wants everything done" in the event of cardiac arrest. Patient answered all orientation questions correctly, but is slightly drowsy. Day shift physician, night shift physician, and patient's family were all called to discuss this. Per father, patient had been confused during day shift and had still answered questions appropriately, but would attempt to get up and say he needs to "go to the living room" and other inappropriate comments. Patient has had mind altering medication prior to procedure at this time and MD is unable to assess if patient is competent to make his own decisions. After discussion between day and night physician, DNR orders were placed back in chart.

## 2018-03-30 NOTE — Progress Notes (Signed)
Orders given to transfer Pt to ICU. Report given to RN. Pt and belongings sent to [facility].

## 2018-03-30 NOTE — Significant Event (Signed)
Rapid Response Event Note  Overview: called to assist with patient with hypotension Time Called: 1541 Arrival Time: 1550 Event Type: Hypotension  Initial Focused Assessment:  Supine in bed - alert but drifts off - hot and dry - pale - arouses easily to name - oriented - states he feels "off" .  He states he had hydrotherapy - did fine - got out of bed with PT - soon after felt weak and had trouble standing - "legs very weak."  Bil UE strong without weakness - bil LE weak - can wiggle toes right greater than left.  Bends right knee but cannot bend left leg. Otherwise neuro intact.  Denies pain or numbness.  Bil BS = clear - denies SOB or chest pain now and when OOB earlier.  Speech clear.  2nd liter NS infusing per order MD wide open.  SR on monitor.  Manual BP right arm 62/38.  O2 sats 84% on RA - resps regular and unlabored.     Interventions:  Continue NS infusion - HOB down - placed on 2 liters nasal cannula- Dr. Lisette Abu to bedside - Solucortef IV given per order by RN.  BP slowly coming up.  Right leg stronger - - bending spontaneous - left leg still unable to move.  Dr. Thedore Mins to bedside.  Patient remains awake and mentating.  BP slowly responding to fluids - now at 89/48 with MAP of 60.  Dr. Venetia Maxon to bedside.  2nd NS bolus finished = BP holding at 90/58 with MAP of 63.  Remains alert - mentation intact.  ECHO being done.  RN Sierra hanging abx.  BP 91/56 MAP 67.  Sarah calling report - for transfer to 66M.  Transferred to 54m05 - handoff to RN.  BP remains with MAP 67.    Plan of Care (if not transferred):  Event Summary: Name of Physician Notified: Dr. Thedore Mins at ([facility] RRT)    at          [place], [facility]

## 2018-03-30 NOTE — Progress Notes (Signed)
Pt BP taken, 84/54 manually w/ Temp 103. MD made aware. Orders given for 1L bolus & Tylenol 650mg . Will continue to monitor.

## 2018-03-30 NOTE — Progress Notes (Signed)
Bolus given w/ no improvement of BP. BP dropped 76/36. MD aware and another 1L bolus ordered. Rapid response made aware. Pt alert and talking but having weakness in lower extremities. MD & RR RN at bedside.

## 2018-03-30 NOTE — Progress Notes (Deleted)
Physical Therapy Wound Treatment Patient Details  Name: Deshannon Hinchliffe MRN: [id number] Date of Birth: [birth year]  Today's Date: 03/30/2018 Time: 3559-7416 Time Calculation (min): 70 min  Subjective  Subjective: Pleasant and agreeable to hydrotherapy Patient and Family Stated Goals: Heal wound Prior Treatments: 03/22/18 I&D lumbar wounds  Pain Score:  Pt appeared to be moderately painful during treatment session however did not ask for any additional pain medication.   Wound Assessment  Wound / Incision (Open or Dehisced) 03/23/18 Incision - Open Lumbar Upper (Active)  Wound Image   03/30/2018  2:24 PM  Dressing Type ABD;Gauze (Comment);Barrier Film (skin prep);Moist to dry 03/30/2018  2:24 PM  Dressing Changed Changed 03/30/2018  2:24 PM  Dressing Status Clean;Dry;Intact 03/30/2018  2:24 PM  Dressing Change Frequency Daily 03/30/2018  2:24 PM  Site / Wound Assessment Pink;Red;Yellow 03/30/2018  2:24 PM  % Wound base Red or Granulating 85% 03/30/2018  2:24 PM  % Wound base Yellow/Fibrinous Exudate 15% 03/30/2018  2:24 PM  % Wound base Black/Eschar 0% 03/30/2018  2:24 PM  % Wound base Other/Granulation Tissue (Comment) 0% 03/30/2018  2:24 PM  Peri-wound Assessment Intact 03/30/2018  2:24 PM  Wound Length (cm) 3 cm 03/23/2018  1:00 PM  Wound Width (cm) 1.7 cm 03/23/2018  1:00 PM  Wound Depth (cm) 1.4 cm 03/23/2018  1:00 PM  Wound Volume (cm^3) 7.14 cm^3 03/23/2018  1:00 PM  Wound Surface Area (cm^2) 5.1 cm^2 03/23/2018  1:00 PM  Undermining (cm) 3.5cm 11-12 o'clock; 4.1cm 12-1 o'clock; 2.6cm 3'oclock;2.5cm 5 o'clock; 2.9cm 6 o'clock; 2.5cm 7 o'clock 03/23/2018  1:00 PM  Margins Unattached edges (unapproximated) 03/30/2018  2:24 PM  Closure None 03/30/2018  2:24 PM  Drainage Amount Minimal 03/30/2018  2:24 PM  Drainage Description Purulent 03/30/2018  2:24 PM  Treatment Debridement (Selective);Hydrotherapy (Pulse lavage);Packing (Saline gauze) 03/30/2018  2:24 PM     Wound / Incision (Open or Dehisced)  03/23/18 Incision - Open Lumbar Lower (Active)  Wound Image   03/30/2018  2:24 PM  Dressing Type Abdominal binder;Gauze (Comment);Moist to dry;Barrier Film (skin prep) 03/30/2018  2:24 PM  Dressing Changed Changed 03/30/2018  2:24 PM  Dressing Status Clean;Dry;Intact 03/30/2018  2:24 PM  Dressing Change Frequency Daily 03/30/2018  2:24 PM  Site / Wound Assessment Clean;Dry 03/30/2018  2:24 PM  % Wound base Red or Granulating 30% 03/30/2018  2:24 PM  % Wound base Yellow/Fibrinous Exudate 30% 03/30/2018  2:24 PM  % Wound base Black/Eschar 0% 03/30/2018  2:24 PM  % Wound base Other/Granulation Tissue (Comment) 40% Fascia (appears shiny and white under the yellow purulent drainage) 03/30/2018  2:24 PM  Peri-wound Assessment Pink 03/30/2018  2:24 PM  Wound Length (cm) 3.3 cm 03/23/2018  1:00 PM  Wound Width (cm) 1.8 cm 03/23/2018  1:00 PM  Wound Depth (cm) 1.5 cm 03/23/2018  1:00 PM  Wound Volume (cm^3) 8.91 cm^3 03/23/2018  1:00 PM  Wound Surface Area (cm^2) 5.94 cm^2 03/23/2018  1:00 PM  Undermining (cm) almost 2 cm in all directions on both wounds 03/28/2018  3:40 PM  Margins Unattached edges (unapproximated) 03/30/2018  2:24 PM  Closure None 03/30/2018  2:24 PM  Drainage Amount Moderate 03/30/2018  2:24 PM  Drainage Description Purulent 03/30/2018  2:24 PM  Treatment Debridement (Selective);Hydrotherapy (Pulse lavage);Packing (Saline gauze) 03/30/2018  2:24 PM     Incision (Closed) 03/22/18 Back Left (Active)  Dressing Type ABD;Tape dressing 03/30/2018  8:00 AM  Dressing Clean;Dry;Intact 03/30/2018  8:00 AM  Dressing Change Frequency Other (  Comment) 03/30/2018  8:00 AM  Site / Wound Assessment Dressing in place / Unable to assess 03/30/2018  8:00 AM  Drainage Amount None 03/30/2018  8:00 AM  Drainage Description Serosanguineous 03/27/2018  9:25 AM  Treatment Cleansed 03/22/2018  8:32 PM   Hydrotherapy Pulsed lavage therapy - wound location: x2 lumbar wounds Pulsed Lavage with Suction (psi): 12 psi Pulsed  Lavage with Suction - Normal Saline Used: 1000 mL Pulsed Lavage Tip: Tip with splash shield Selective Debridement Selective Debridement - Location: x2 lumbar wounds Selective Debridement - Tools Used: Forceps;Scissors Selective Debridement - Tissue Removed: yellow necrotic/unviable tissue   Wound Assessment and Plan  Wound Therapy - Assess/Plan/Recommendations Wound Therapy - Clinical Statement: Inferior wound very wet with purulent drainage this session however overall amount that was draining during session appeared decreased. Pt tolerated treatment well although pain seemed to be a little elevated with packing. Pt continues to benefit from hydrotherapy to reduce bioburden and promote wound healing.   Wound Therapy - Functional Problem List: Acute pain, decreased tolerance for position changes Factors Delaying/Impairing Wound Healing: Diabetes Mellitus;Infection - systemic/local Hydrotherapy Plan: Debridement;Dressing change;Patient/family education;Pulsatile lavage with suction Wound Therapy - Frequency: 6X / week Wound Therapy - Current Recommendations: PT Wound Therapy - Follow Up Recommendations: Home health RN Wound Plan: See above  Wound Therapy Goals- Improve the function of patient's integumentary system by progressing the wound(s) through the phases of wound healing (inflammation - proliferation - remodeling) by: Decrease Necrotic Tissue to: 0% both wounds Decrease Necrotic Tissue - Progress: Progressing toward goal Increase Granulation Tissue to: 100% both wounds Increase Granulation Tissue - Progress: Progressing toward goal Improve Drainage Characteristics: Min;Serous Improve Drainage Characteristics - Progress: Progressing toward goal Goals/treatment plan/discharge plan were made with and agreed upon by patient/family: Yes Time For Goal Achievement: 7 days Wound Therapy - Potential for Goals: Good  Goals will be updated until maximal potential achieved or discharge  criteria met.  Discharge criteria: when goals achieved, discharge from hospital, MD decision/surgical intervention, no progress towards goals, refusal/missing three consecutive treatments without notification or medical reason.  GP     Thelma Comp 03/30/2018, 2:30 PM   Rolinda Roan, PT, DPT Acute Rehabilitation Services Pager: [phone] Office: [phone]

## 2018-03-30 NOTE — Progress Notes (Addendum)
Subjective: Patient reports "I'm still hurting some, but I was scared that infection was acusing a heart attack"  Objective: Vital signs in last 24 hours: Temp:  [98.3 F (36.8 C)-100.4 F (38 C)] 99.4 F (37.4 C) (10/01 0449) Pulse Rate:  [90-117] 117 (10/01 0925) Resp:  [14-20] 18 (10/01 0449) BP: (99-135)/(60-82) 135/74 (10/01 0925) SpO2:  [92 %-100 %] 92 % (10/01 0449)  Intake/Output from previous day: 09/30 0701 - 10/01 0700 In: 1280 [P.O.:1080; IV Piggyback:200] Out: 5000 [Urine:5000] Intake/Output this shift: Total I/O In: 250 [P.O.:240; I.V.:10] Out: 1200 [Urine:1200]  Awake, alert, conversant. Reports mild left chest wall pain, improved through the day. Neurologically without change - no deficits. Aware of plan to continue IVAB under direction of ID. Lumbar wounds managed by general surgery and wound therapy. No plans for neurosurgical intervention at present.  Lab Results: Recent Labs    03/30/18 0227  WBC 14.5*  HGB 9.2*  HCT 28.6*  PLT 382   BMET Recent Labs    03/30/18 0227  NA 135  K 2.8*  CL 90*  CO2 34*  GLUCOSE 230*  BUN 6  CREATININE 0.68  CALCIUM 8.5*    Studies/Results: Dg Chest Port 1 View  Result Date: 03/30/2018 CLINICAL DATA:  Chest pain EXAM: PORTABLE CHEST 1 VIEW COMPARISON:  None. FINDINGS: Retrocardiac opacity, atelectasis versus pneumonia. Possible small left pleural effusion. Right lung is clear. No pneumothorax. The heart is normal in size. IMPRESSION: Retrocardiac opacity, atelectasis versus pneumonia. Possible small left pleural effusion. Electronically Signed   By: Charline Bills M.D.   On: 03/30/2018 02:49    Assessment/Plan:   LOS: 10 days  Supportive care continues   Georgiann Cocker 03/30/2018, 11:13 AM  [name] neurologically stable and not in need of surgery for ESA.

## 2018-03-30 NOTE — Significant Event (Signed)
Rapid Response Event Note  Overview: Cardiac - Chest Pain  Initial Focused Assessment: Called by RN about patient having an acute onset of substernal chest pain with shortness of breath. VSS per RN and RN was obtaining an EKG.  Upon arrival, patient stated his pain was severe 10/10 left substernal pain, pain upon inspiration, painful to take a deep breath. Per patient this started after his dressing was changed. VSS. Lung sounds were clear, good air movement overall. Skin warm and dry, + pulses. Patient endorse feeling short of breath at times. Oxygen saturations were 100% on 2L [place].  Patient was given NTG SL 0.4mg  x 1 and his pain improved to 8/10 but patient developed a headache as well, VSS. I paged TRH NP, NP came to the bedside.  Interventions: - EKG - ST and T wave changes present.  - STAT TROPONIN - NTG SL 0.4mg  x 1 - NP ordered Toradol 30 mg x 1, Benadryl 25 mg x 1, Reglan 10 mg x 1 - STAT CXR - Chest pain improved to 5/10 then patient stated he pain was relieved at rest, but he still had pain with taking deep breaths.  Plan of Care: - TRH NP to page CARDS MD on call. - follow up with troponin - Troponin was negative.  Event Summary:  Call Time: 0129 Arrival Time: 0130 End Time:00245    at     Mahsa Hanser, Dionicio Stall R

## 2018-03-30 NOTE — Progress Notes (Signed)
Pharmacy Antibiotic Note  Tony Long is a 42 y.o. male admitted to [name] on 03/20/2018, patient has transferred to [facility].  Patient past medical history significant for DM2, HTN, h/o IVDU, c/o lower back pain last 4 weeks.  Patient received one dose of cefepime, vancomycin and metronidazole in the ED at [facility].  Pt has been on Ancef for his MSSA bacteremia/endocarditis/dicitis. He became septic today so his abx will be broaden out to Merrem/vanc.    Plan: Vanc 1.5g IV x1 then 750mg  IV q8 Merrem 2g IV x1 then 1g IV q8 Trough as needed  Height: 5\' 11"  (180.3 cm) Weight: 150 lb (68 kg) IBW/kg (Calculated) : 75.3  Temp (24hrs), Avg:99.1 F (37.3 C), Min:98.3 F (36.8 C), Max:100.1 F (37.8 C)  Recent Labs  Lab 03/24/18 0434 03/26/18 0640 03/30/18 0227  WBC 16.7* 15.4* 14.5*  CREATININE 0.64 1.08 0.68    Estimated Creatinine Clearance: 116.9 mL/min (by C-G formula based on SCr of 0.68 mg/dL).    No Known Allergies  Antimicrobials this admission: 9/21 cefepime x1 9/21 vancomycin x1 9/21 metronidazole x1 9/22 Cefazolin >>10/1 10/1 Merrem>> 10/1 Vanc>>  Dose adjustments this admission: n/a  Microbiology results: 9/22 BCx: prelim - GRAM POSITIVE COCCI (9/22 - 0149) 9/21 BCID: Staph aureus, MRSA not detected. 9/21 MRSA PCR: negative 9/24 blood>>neg 9/23 back abscess>>MSSA 10/1 blood>>  Ulyses Southward, PharmD, [place], [place], CPP Infectious Disease Pharmacist Pager: [phone] 03/30/2018 4:25 PM

## 2018-03-30 NOTE — Progress Notes (Signed)
Shift event note:  Notified by RN regarding pt c/o acute onset of (L) sided CP at 10/10. Pain reported to be non-radiating and worse w/ deep inspiration. Pt 02 sats 95% on r/a, RN placed pt on 2L [place]. RR RN was paged and responded to bedside. Pain improved to 5/10 after one SL NTG. EKG was obtained and revealed ST w/ rate of 101 w/ ST and T-wave abnormality. My colleague, Dr Julian Reil reviewed EKG as well and felt the ST depression in leads V4, 5 & 6 was more pronounced than in previous EKG. At bedside pt noted resting in bed in NAD. He still reports CP of 5/10 most pronounced w/ deep inspiration. Also reports pain is worse w/ movement. Unable to recreate w/ chest palpation. Pt denies SOB just states he can not take a deep breath d/t pain. Denies nausea, dizziness or diaphoresis. BBS CTA. PCXR, Troponin, CBC and BMET obtained. Pt observed at bedside for approx 30 minutes and at the time of this NP's departure pt reported no CP unless deep breathing or movement and was left resting w/ eyes closed.  Assessment/Plan: 1. Chest pain: Atypical. Question worsening ST depression in leads V-4,5 & 6. Currently resolved except w/ deep inspiration. PCXR > Retrocardiac opacity, atelectasis versus pneumonia. Possible small left pleural effusion. First troponin <0.03. HB 9.2 from 10.2 on 03/26/2018, K+ 2.8 (repleted) and leukocytosis continues to improve. Discussed pt w/ Dr Franz Dell w/ cardiology service who agreed to review EKG and assuming he doesn't appreciate any acute changes has no recommendations for tonight. Follow troponin's and re-consult cardiology as indicated. Will continue to monitor closely on telemetry.  Leanne Chang, NP-C [facility] Pager [phone]  CRITICAL CARE Performed by: Leanne Chang   Total critical care time:  60 minutes  Critical care time was exclusive of separately billable procedures and treating other patients.  Critical care was necessary to treat or prevent  imminent or life-threatening deterioration.  Critical care was time spent personally by me on the following activities: development of treatment plan with patient and/or surrogate as well as nursing, discussions with consultants, evaluation of patient's response to treatment, examination of patient, obtaining history from patient or surrogate, ordering and performing treatments and interventions, ordering and review of laboratory studies, ordering and review of radiographic studies, pulse oximetry and re-evaluation of patient's condition.

## 2018-03-30 NOTE — Progress Notes (Signed)
Dressing on back x 2 changed, dressing had moderate purulent drainage on dressing.  Patient tolerated procedure well.  Will continue to monitor.

## 2018-03-30 NOTE — Progress Notes (Addendum)
PROGRESS NOTE        PATIENT DETAILS Name: Tony Long Age: 42 y.o. Sex: male Date of Birth: Nov 03, 1975 Admit Date: 03/20/2018 Admitting Physician Tony Bane, MD ZOX:[id number], Tony Gala, FNP  Brief Narrative: [name] a 42 y.o. male history of IVDA, DM-2, hypertension admitted for worsening back pain-further evaluation revealed MSSA bacteremia with tricuspid valve endocarditis, epidural abscess involving C2 through sacrum, and numerous soft tissue back abscesses.  Evaluated by general surgery-underwent I&D of her lower back soft tissue abscess, evaluated by neurosurgery-not felt to be a candidate for decompressive surgery due to extensive nature of the disease and lack of any significant neurological findings.  ID following with plans to continue Ancef with stop date of 05/25/2018.  See below for further details  Subjective: in Bed, developed left-sided pleuritic chest pain last night which has improved but still present, mildly short of breath this morning, no headache, no abdominal pain no focal weakness.   Assessment/Plan:    MSSA bacteremia with tricuspid valve endocarditis, extensive epidural abscess (C2 through sacrum) and large soft tissue lower back abscess: Remains stable-due to extensive nature of the epidural abscess and lack of concerning neurological findings-neurosurgery does not recommend decompressive surgery.  ID recommending continuing IV antibiotics with stop date of 11/26, currently on cefazolin.  Given history of IVDA-not a candidate for outpatient IV antimicrobial therapy. He has received a PICC line, continue IV antibiotics through PEG.   If patient cannot be placed to SNF-he will likely remain inpatient.  Patient aware of the extensive nature of this infection-with life-threatening and life disabling risks including quadriplegia along with life-threatening sepsis with this present infection which is nonsurgical.  This was explained by  me to the patient several times along with his parents bedside on 03/22/2018 and 03/23/2018.  New onset of left-sided pleuritic chest pain starting on late night 03/29/2018.  Does have lateral T wave inversions and ST depression on EKG, repeat EKG improved, troponin single set negative.  At this time will cycle troponin, repeat echocardiogram, increase beta-blocker, add low-dose aspirin, Imdur along with high intensity statin.  Poor candidate for invasive testing due to ongoing abscess and endocarditis.  Also is at risk for septic emboli or PE and will check CT angiogram as well.  Hypertension: Increased beta-blocker dose, PRN hydralazine.   Constipation: Had bowel movement overnight-probably secondary to narcotic use-have placed on scheduled MiraLAX and senna.  Acute urinary retention: Required Foley catheter placement on 03/26/2018 along with Flomax, continue for now.  Will try removing Foley catheter on 03/31/2018 and monitor.  Cocaine/Subutex abuse: Withdrawal symptoms much better-on 9/25 patient developed excessive sedation-Klonopin/Flexeril has been changed to as needed dosing-clonidine being tapered down.  On 03/28/2018 he requested me that he be put back on his Suboxone, he does not want short-acting pain medications anymore, changes will be made per his request.    Polysubstance abuse/IVDA: Counseled to quit.  Hypokalemia and hypomagnesemia.  Both replaced.  Monitor.  DM-2: Poor outpatient control, continue Lantus and sliding scale.     CBG (last 3)  Recent Labs    03/29/18 1657 03/29/18 2158 03/30/18 0746  GLUCAP 225* 369* 134*   Lab Results  Component Value Date   HGBA1C 12.6 (A) 02/26/2018      Addendum.  At 2 PM on 03/30/18 patient started running high temp of 103, this happened few minutes  after he had hydrotherapy.  Blood cultures were drawn and he was given Tylenol, soon thereafter his blood pressure started to drop, he received 2 L of normal saline, he also started  developing toxic encephalopathy, about an hour later he started complaining of some left lower extremity weakness which is new and developed in the last few hours.  Despite ongoing bolus continues to have low blood pressure and encephalopathy, will start stress dose steroids and Levophed and transferred to ICU.  Antibiotics broadened from cefazolin to vancomycin and meropenem.  Transfer to ICU.  Baseline lactic acid and procalcitonin have been ordered.  Neurosurgery also coming to evaluate for new left lower extremity weakness although options are limited.  Once clinically stable he will require CT chest along with a repeat MRI of T and L-spine.      DVT Prophylaxis: Prophylactic heparin  Code Status: Full code  Family Communication: Mother at bedside x 4   Disposition Plan: Remain inpatient-will be inpatient until 11/26-if SNF placement not possible.    Antimicrobial agents: Anti-infectives (From admission, onward)   Start     Dose/Rate Route Frequency Ordered Stop   03/21/18 0930  ceFAZolin (ANCEF) IVPB 2g/100 mL premix     2 g 200 mL/hr over 30 Minutes Intravenous Every 8 hours 03/21/18 0920        Procedures:   CT angiogram chest.    Repeat echocardiogram 03/30/2018   TEE - - Left ventricle: The cavity size was normal. Wall thickness wasnormal. Systolic function was normal. The estimated ejectionfraction was in the range of 60% to 65%. - Aortic valve: No evidence of vegetation. - Mitral valve: No evidence of vegetation. - Left atrium: No evidence of thrombus in the atrial cavity or appendage. No evidence of thrombus in the appendage. - Right atrium: No evidence of thrombus in the atrial cavity orappendage. - Tricuspid valve: There was a vegetation. There was a small (1.2cmx .6cm), mobile vegetation on the septal leaflet. There was mildregurgitation. - Pulmonic valve: No evidence of vegetation.  Impressions:   Tricuspid valve endocarditis. Discussed with  primary team   MRI C-T-L Spine - 1. Epidural abscess from C2 to sacrum as described. Maximal cord compression from T3-4 to T5-6. The thecal sac is completely effaced from L1 to L4-5. At the L3-4 interspinous space the spinal abscess communicates with large bilateral abscesses within the intrinsic back muscles. There is osteomyelitis of the L2, L3, and L4 spinous processes. No discitis or facet arthritis. 2. Left paravertebral abscess along the lower thoracic spine with small left empyema that is new from CT 2 days ago. 3. Distended bladder  CT - 1. Evidence of cellulitis involving the right side of the base of the neck extending into the right supraclavicular region with fluid and gas in the soft tissues at the base of the right side of the neck. 2. No significant abnormality of the thoracic spine. 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  CT -  1. Extensive abnormal fluid collection in the subcutaneous fat of the midline of the back with underlying marked abnormality of the posterior paraspinal musculature from L1-2 through S3. This is worrisome for subcutaneous abscess and myositis. 2. Moth-eaten appearance of the spinous processes of L3 and L4 consistent with osteomyelitis. 3. No discrete epidural abscess. However, the abnormal edema in the paraspinal musculature extends to the posterior aspect of the spinal canal at L3-4. 4. MRI with and without contrast may better define the extent of the soft tissue and infection and could  detect epidural extension that is not apparent on this unenhanced CT scan.  9/22>> PICC line  I&D by [facility] of back soft tissue abscess on -  03/22/18    CONSULTS:  ID, general surgery and Neurosurgery  Time spent: 25- minutes-Greater than 50% of this time was spent in counseling, explanation of diagnosis, planning of further management, and coordination of care.  MEDICATIONS: Scheduled Meds: . aspirin  81 mg Oral Daily  . atorvastatin  40 mg Oral q1800  .  buprenorphine-naloxone  1 tablet Sublingual Daily  . gabapentin  300 mg Oral TID  . heparin injection (subcutaneous)  5,000 Units Subcutaneous Q8H  . insulin aspart  0-5 Units Subcutaneous QHS  . insulin aspart  0-9 Units Subcutaneous TID WC  . insulin glargine  25 Units Subcutaneous QHS  . isosorbide mononitrate  60 mg Oral Daily  . metoprolol tartrate  100 mg Oral BID  . metoprolol tartrate  50 mg Oral Once  . nicotine  21 mg Transdermal Daily  . polyethylene glycol  17 g Oral BID  . senna-docusate  2 tablet Oral QHS  . sodium chloride flush  10-40 mL Intracatheter Q12H  . tamsulosin  0.4 mg Oral Daily   Continuous Infusions: .  ceFAZolin (ANCEF) IV 2 g (03/30/18 0537)  . magnesium sulfate 1 - 4 g bolus IVPB    . potassium chloride 10 mEq (03/30/18 1029)   PRN Meds:.bisacodyl, clonazepam, cyclobenzaprine, hydrALAZINE, magnesium hydroxide, metoprolol tartrate, nitroGLYCERIN, sodium phosphate   PHYSICAL EXAM: Vital signs: Vitals:   03/30/18 0124 03/30/18 0241 03/30/18 0449 03/30/18 0925  BP: 131/82 99/63 118/79 135/74  Pulse: (!) 101 90 96 (!) 117  Resp:   18   Temp:   99.4 F (37.4 C)   TempSrc:   Oral   SpO2: 95% 100% 92%   Weight:      Height:       Filed Weights   03/21/18 0602  Weight: 68 kg   Body mass index is 20.92 kg/m.   Awake Alert, Oriented X 3, No new F.N deficits, Normal affect Tony Long,PERRAL Supple Neck,No JVD, No cervical lymphadenopathy appriciated.  Symmetrical Chest wall movement, Good air movement bilaterally, CTAB RRR,No Gallops, Rubs or new Murmurs, No Parasternal Heave +ve B.Sounds, Abd Soft, No tenderness, No organomegaly appriciated, No rebound - guarding or rigidity. No Cyanosis, Clubbing or edema, No new Rash or bruise Foley catheter in place  I have personally reviewed following labs and imaging studies  LABORATORY DATA: CBC: Recent Labs  Lab 03/24/18 0434 03/26/18 0640 03/30/18 0227  WBC 16.7* 15.4* 14.5*  HGB 11.4* 10.2* 9.2*    HCT 35.7* 31.4* 28.6*  MCV 97.3 95.2 95.7  PLT 293 289 382    Basic Metabolic Panel: Recent Labs  Lab 03/24/18 0434 03/26/18 0640 03/30/18 0227  NA 140 138 135  K 2.9* 3.5 2.8*  CL 104 103 90*  CO2 24 22 34*  GLUCOSE 174* 319* 230*  BUN 17 15 6   CREATININE 0.64 1.08 0.68  CALCIUM 9.3 8.9 8.5*  MG 1.9  --  1.6*    GFR: Estimated Creatinine Clearance: 116.9 mL/min (by C-G formula based on SCr of 0.68 mg/dL).  Liver Function Tests: Recent Labs  Lab 03/24/18 0434  AST 17  ALT 9  ALKPHOS 121  BILITOT 0.5  PROT 6.6  ALBUMIN 1.9*   No results for input(s): LIPASE, AMYLASE in the last 168 hours. No results for input(s): AMMONIA in the last 168 hours.  Coagulation Profile:  No results for input(s): INR, PROTIME in the last 168 hours.  Cardiac Enzymes: Recent Labs  Lab 03/30/18 0219  TROPONINI <0.03    BNP (last 3 results) No results for input(s): PROBNP in the last 8760 hours.  HbA1C: No results for input(s): HGBA1C in the last 72 hours.  CBG: Recent Labs  Lab 03/29/18 0748 03/29/18 1153 03/29/18 1657 03/29/18 2158 03/30/18 0746  GLUCAP 196* 246* 225* 369* 134*    Lipid Profile: No results for input(s): CHOL, HDL, LDLCALC, TRIG, CHOLHDL, LDLDIRECT in the last 72 hours.  Thyroid Function Tests: No results for input(s): TSH, T4TOTAL, FREET4, T3FREE, THYROIDAB in the last 72 hours.  Anemia Panel: No results for input(s): VITAMINB12, FOLATE, FERRITIN, TIBC, IRON, RETICCTPCT in the last 72 hours.  Urine analysis:    Component Value Date/Time   COLORURINE YELLOW (A) 03/20/2018 1622   APPEARANCEUR CLEAR (A) 03/20/2018 1622   LABSPEC 1.015 03/20/2018 1622   PHURINE 5.0 03/20/2018 1622   GLUCOSEU >500 (A) 03/20/2018 1622   HGBUR SMALL (A) 03/20/2018 1622   BILIRUBINUR NEGATIVE 03/20/2018 1622   BILIRUBINUR small 02/26/2018 1202   KETONESUR 80 (A) 03/20/2018 1622   PROTEINUR NEGATIVE 03/20/2018 1622   UROBILINOGEN 1.0 02/26/2018 1202    UROBILINOGEN 1.0 12/11/2016 1020   NITRITE NEGATIVE 03/20/2018 1622   LEUKOCYTESUR NEGATIVE 03/20/2018 1622    Sepsis Labs: Lactic Acid, Venous    Component Value Date/Time   LATICACIDVEN 1.4 03/20/2018 1235    MICROBIOLOGY: Recent Results (from the past 240 hour(s))  MRSA PCR Screening     Status: None   Collection Time: 03/20/18  3:50 PM  Result Value Ref Range Status   MRSA by PCR NEGATIVE NEGATIVE Final    Comment:        The GeneXpert MRSA Assay (FDA approved for NASAL specimens only), is one component of a comprehensive MRSA colonization surveillance program. It is not intended to diagnose MRSA infection nor to guide or monitor treatment for MRSA infections. Performed at [name], [address] [place], [place] [address]   Anaerobic culture     Status: None   Collection Time: 03/22/18 10:36 AM  Result Value Ref Range Status   Specimen Description ABSCESS BACK  Final   Special Requests NONE  Final   Gram Stain   Final    RARE WBC PRESENT, PREDOMINANTLY MONONUCLEAR ABUNDANT GRAM POSITIVE COCCI    Culture   Final    NO ANAEROBES ISOLATED Performed at [facility], [address], [place], [place] [address]    Report Status 03/27/2018 FINAL  Final  Aerobic Culture (superficial specimen)     Status: None   Collection Time: 03/22/18 10:36 AM  Result Value Ref Range Status   Specimen Description ABSCESS BACK  Final   Special Requests NONE  Final   Gram Stain   Final    FEW WBC PRESENT,BOTH PMN AND MONONUCLEAR MODERATE GRAM POSITIVE COCCI Performed at [facility], [address], [place], [place] [address]    Culture ABUNDANT STAPHYLOCOCCUS AUREUS  Final   Report Status 03/24/2018 FINAL  Final   Organism ID, Bacteria STAPHYLOCOCCUS AUREUS  Final      Susceptibility   Staphylococcus aureus - MIC*    CIPROFLOXACIN <=0.5 SENSITIVE Sensitive     ERYTHROMYCIN <=0.25 SENSITIVE Sensitive     GENTAMICIN <=0.5 SENSITIVE Sensitive      OXACILLIN 0.5 SENSITIVE Sensitive     TETRACYCLINE <=1 SENSITIVE Sensitive  VANCOMYCIN 1 SENSITIVE Sensitive     TRIMETH/SULFA <=10 SENSITIVE Sensitive     CLINDAMYCIN <=0.25 SENSITIVE Sensitive     RIFAMPIN <=0.5 SENSITIVE Sensitive     Inducible Clindamycin NEGATIVE Sensitive     * ABUNDANT STAPHYLOCOCCUS AUREUS  Culture, blood (Routine X 2) w Reflex to ID Panel     Status: None   Collection Time: 03/23/18  4:39 AM  Result Value Ref Range Status   Specimen Description BLOOD RIGHT HAND  Final   Special Requests   Final    BOTTLES DRAWN AEROBIC ONLY Blood Culture adequate volume   Culture   Final    NO GROWTH 5 DAYS Performed at [facility], [address] [place], [place] [address]    Report Status 03/28/2018 FINAL  Final    RADIOLOGY STUDIES/RESULTS: Ct Thoracic Spine Wo Contrast  Result Date: 03/20/2018 CLINICAL DATA:  Progressive back pain.  Swelling of the lower back. EXAM: CT THORACIC SPINE WITHOUT CONTRAST TECHNIQUE: Multidetector CT images of the thoracic were obtained using the standard protocol without intravenous contrast. COMPARISON:  None. FINDINGS: Alignment: Normal. 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 Paraspinal and other soft tissues: There is abnormal gas and fluid in the soft tissues of the right side of the base of the neck and in the right supraclavicular region. Paraspinal soft tissues appear normal throughout the thoracic spine. Disc levels: There is no evidence of disc protrusion or significant disc bulging or spinal or foraminal stenosis or other significant abnormality of the thoracic spine. IMPRESSION: 1. Evidence of cellulitis involving the right side of the base of the neck extending into the right supraclavicular region with fluid and gas in the soft tissues at the base of the right side of the neck. 2. No significant abnormality of the thoracic spine. 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 Electronically Signed   By: Francene Boyers M.D.    On: 03/20/2018 11:25   Ct Lumbar Spine Wo Contrast  Addendum Date: 03/20/2018   ADDENDUM REPORT: 03/20/2018 11:44 ADDENDUM: Critical Value/emergent results were called by telephone at the time of interpretation on 03/20/2018 at 11:30 am to Dr. Sharyn Creamer , who verbally acknowledged these results. Electronically Signed   By: Francene Boyers M.D.   On: 03/20/2018 11:44   Result Date: 03/20/2018 CLINICAL DATA:  Increasing low back pain and soft tissue swelling. EXAM: CT LUMBAR SPINE WITHOUT CONTRAST TECHNIQUE: Multidetector CT imaging of the lumbar spine was performed without intravenous contrast administration. Multiplanar CT image reconstructions were also generated. IV contrast could not be utilized due to the lack of an appropriate IV. COMPARISON:  None. FINDINGS: Segmentation: 5 lumbar type vertebrae. Alignment: Normal. Vertebrae: There is a moth-eaten appearance of the spinous processes of L3 and L4 which is worrisome for osteomyelitis. Bilateral pars defects at L5 with grade 1 spondylolisthesis. 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 Paraspinal and other soft tissues: There is an extensive abnormal fluid collection in the subcutaneous soft tissues of the posterior aspect of the back extending from approximately L1-2 to S3. This fluid collection is lobulated and measures approximately 20 x 9 x 2.5 cm. It is centered slightly to the left of midline and has a mass effect upon the adjacent posterior paraspinal muscles. There is abnormal lucency in the underlying paraspinal muscles which could represent myositis. Disc levels: T11-12: No significant abnormality. Butterfly T12 vertebra. T12-L1: No significant abnormality. L1-2: Normal disc. Abnormal edema in the posterior paraspinal musculature with adjacent fluid collection in the subcutaneous fat of the  posterior aspect of the back as described above. L2-3: Normal disc. L3-4: Normal disc. Lucency in the posterior paraspinal soft tissues extends to the posterior  aspect of the thecal sac on image 80 of series 4 but there is no discrete epidural abscess. L4-5: Normal disc.  No evidence of epidural abscess. L5-S1: Grade 1 spondylolisthesis. No disc bulging or protrusion. Bilateral pars defects. No visible epidural abscess. IMPRESSION: 1. Extensive abnormal fluid collection in the subcutaneous fat of the midline of the back with underlying marked abnormality of the posterior paraspinal musculature from L1-2 through S3. This is worrisome for subcutaneous abscess and myositis. 2. Moth-eaten appearance of the spinous processes of L3 and L4 consistent with osteomyelitis. 3. No discrete epidural abscess. However, the abnormal edema in the paraspinal musculature extends to the posterior aspect of the spinal canal at L3-4. 4. MRI with and without contrast may better define the extent of the soft tissue and infection and could detect epidural extension that is not apparent on this unenhanced CT scan. Electronically Signed: By: Francene Boyers M.D. On: 03/20/2018 11:18   Mr Cervical Spine W Wo Contrast  Result Date: 03/22/2018 CLINICAL DATA:  Spine infection. EXAM: MRI TOTAL SPINE WITHOUT AND WITH CONTRAST TECHNIQUE: Multisequence MR imaging of the spine from the cervical spine to the sacrum was performed prior to and following IV contrast administration. CONTRAST:  6 cc Gadavist intravenous COMPARISON:  CT of the thoracic and lumbar spine from 2 days ago FINDINGS: MRI CERVICAL SPINE FINDINGS Alignment: Normal Vertebrae: No evidence of osseous infection. Canal/Cord: There is extensive spinal fluid collection preferentially in the ventral but also in the right more than left dorsal canal. Subarachnoid space is diffusely effaced. Maximal thickness is posterior to C2 at 9 mm. No cord signal abnormality. Posterior Fossa, vertebral arteries, paraspinal tissues: No retropharyngeal or other discrete soft tissue collection. Disc levels: C2-3: Unremarkable. C3-4: Small left foraminal  protrusion with moderate narrowing C4-5: Unremarkable. C5-6: Disc narrowing and bulging with asymmetric left uncovertebral spurring. Left foraminal impingement C6-7: Right foraminal protrusion mild narrowing. C7-T1:Unremarkable. MRI THORACIC SPINE FINDINGS Alignment:  Normal Vertebrae: No evidence of osteomyelitis or discitis. T12 butterfly vertebra. Canal/Cord: Cervical ventral epidural collection continues throughout the thoracic levels. There is also a focal dorsal component at T3-4 to T5-6, where thecal sac effacement is accentuated and there is cord flattening. No cord edema. Paraspinal and other soft tissues: Paraspinous phlegmon on the left at T8-T12, with new complex left pleural effusion and lower lobe atelectasis Disc levels: T5-6 central disc protrusion. MRI LUMBAR SPINE FINDINGS Segmentation:  5 lumbar type vertebral bodies Alignment:  Grade 1 anterolisthesis at L5-S1. Vertebrae: Marrow edema and heterogeneous enhancement within the L2, L3, and L4 spinous processes. No discitis or facet edema. Conus medullaris: Extends to the L1 level and is non edematous. There is extensive epidural collection completely effacing the thecal sac throughout the lumbar spine until L4-5 and below where the collection becomes ventral and right eccentric. The infection communicates with extensive bilateral abscess within the intrinsic back muscles via the interspinous space at L3-4. Patient had recent subcutaneous collection and left buttocks collection drainage, with packing seen in place. This midline, upper subcutaneous collection communicates with the paravertebral abscess along its superior margin based on postcontrast axial images. Paraspinal and other soft tissues: As above.  Distended bladder Disc levels: Chronic bilateral pars defects at L5. Critical Value/emergent results were called by telephone at the time of interpretation on 03/22/2018 at 3:04 pm to Dr. Thedore Mins , who verbally  acknowledged these results. IMPRESSION:  1. Epidural abscess from C2 to sacrum as described. Maximal cord compression from T3-4 to T5-6. The thecal sac is completely effaced from L1 to L4-5. At the L3-4 interspinous space the spinal abscess communicates with large bilateral abscesses within the intrinsic back muscles. There is osteomyelitis of the L2, L3, and L4 spinous processes. No discitis or facet arthritis. 2. Left paravertebral abscess along the lower thoracic spine with small left empyema that is new from CT 2 days ago. 3. Distended bladder Electronically Signed   By: Marnee Spring M.D.   On: 03/22/2018 15:11   Mr Thoracic Spine W Wo Contrast  Result Date: 03/22/2018 CLINICAL DATA:  Spine infection. EXAM: MRI TOTAL SPINE WITHOUT AND WITH CONTRAST TECHNIQUE: Multisequence MR imaging of the spine from the cervical spine to the sacrum was performed prior to and following IV contrast administration. CONTRAST:  6 cc Gadavist intravenous COMPARISON:  CT of the thoracic and lumbar spine from 2 days ago FINDINGS: MRI CERVICAL SPINE FINDINGS Alignment: Normal Vertebrae: No evidence of osseous infection. Canal/Cord: There is extensive spinal fluid collection preferentially in the ventral but also in the right more than left dorsal canal. Subarachnoid space is diffusely effaced. Maximal thickness is posterior to C2 at 9 mm. No cord signal abnormality. Posterior Fossa, vertebral arteries, paraspinal tissues: No retropharyngeal or other discrete soft tissue collection. Disc levels: C2-3: Unremarkable. C3-4: Small left foraminal protrusion with moderate narrowing C4-5: Unremarkable. C5-6: Disc narrowing and bulging with asymmetric left uncovertebral spurring. Left foraminal impingement C6-7: Right foraminal protrusion mild narrowing. C7-T1:Unremarkable. MRI THORACIC SPINE FINDINGS Alignment:  Normal Vertebrae: No evidence of osteomyelitis or discitis. T12 butterfly vertebra. Canal/Cord: Cervical ventral epidural collection continues throughout the thoracic  levels. There is also a focal dorsal component at T3-4 to T5-6, where thecal sac effacement is accentuated and there is cord flattening. No cord edema. Paraspinal and other soft tissues: Paraspinous phlegmon on the left at T8-T12, with new complex left pleural effusion and lower lobe atelectasis Disc levels: T5-6 central disc protrusion. MRI LUMBAR SPINE FINDINGS Segmentation:  5 lumbar type vertebral bodies Alignment:  Grade 1 anterolisthesis at L5-S1. Vertebrae: Marrow edema and heterogeneous enhancement within the L2, L3, and L4 spinous processes. No discitis or facet edema. Conus medullaris: Extends to the L1 level and is non edematous. There is extensive epidural collection completely effacing the thecal sac throughout the lumbar spine until L4-5 and below where the collection becomes ventral and right eccentric. The infection communicates with extensive bilateral abscess within the intrinsic back muscles via the interspinous space at L3-4. Patient had recent subcutaneous collection and left buttocks collection drainage, with packing seen in place. This midline, upper subcutaneous collection communicates with the paravertebral abscess along its superior margin based on postcontrast axial images. Paraspinal and other soft tissues: As above.  Distended bladder Disc levels: Chronic bilateral pars defects at L5. Critical Value/emergent results were called by telephone at the time of interpretation on 03/22/2018 at 3:04 pm to Dr. Thedore Mins , who verbally acknowledged these results. IMPRESSION: 1. Epidural abscess from C2 to sacrum as described. Maximal cord compression from T3-4 to T5-6. The thecal sac is completely effaced from L1 to L4-5. At the L3-4 interspinous space the spinal abscess communicates with large bilateral abscesses within the intrinsic back muscles. There is osteomyelitis of the L2, L3, and L4 spinous processes. No discitis or facet arthritis. 2. Left paravertebral abscess along the lower thoracic spine  with small left empyema that is new from  CT 2 days ago. 3. Distended bladder Electronically Signed   By: Marnee Spring M.D.   On: 03/22/2018 15:11   Mr Lumbar Spine W Wo Contrast  Result Date: 03/22/2018 CLINICAL DATA:  Spine infection. EXAM: MRI TOTAL SPINE WITHOUT AND WITH CONTRAST TECHNIQUE: Multisequence MR imaging of the spine from the cervical spine to the sacrum was performed prior to and following IV contrast administration. CONTRAST:  6 cc Gadavist intravenous COMPARISON:  CT of the thoracic and lumbar spine from 2 days ago FINDINGS: MRI CERVICAL SPINE FINDINGS Alignment: Normal Vertebrae: No evidence of osseous infection. Canal/Cord: There is extensive spinal fluid collection preferentially in the ventral but also in the right more than left dorsal canal. Subarachnoid space is diffusely effaced. Maximal thickness is posterior to C2 at 9 mm. No cord signal abnormality. Posterior Fossa, vertebral arteries, paraspinal tissues: No retropharyngeal or other discrete soft tissue collection. Disc levels: C2-3: Unremarkable. C3-4: Small left foraminal protrusion with moderate narrowing C4-5: Unremarkable. C5-6: Disc narrowing and bulging with asymmetric left uncovertebral spurring. Left foraminal impingement C6-7: Right foraminal protrusion mild narrowing. C7-T1:Unremarkable. MRI THORACIC SPINE FINDINGS Alignment:  Normal Vertebrae: No evidence of osteomyelitis or discitis. T12 butterfly vertebra. Canal/Cord: Cervical ventral epidural collection continues throughout the thoracic levels. There is also a focal dorsal component at T3-4 to T5-6, where thecal sac effacement is accentuated and there is cord flattening. No cord edema. Paraspinal and other soft tissues: Paraspinous phlegmon on the left at T8-T12, with new complex left pleural effusion and lower lobe atelectasis Disc levels: T5-6 central disc protrusion. MRI LUMBAR SPINE FINDINGS Segmentation:  5 lumbar type vertebral bodies Alignment:  Grade 1  anterolisthesis at L5-S1. Vertebrae: Marrow edema and heterogeneous enhancement within the L2, L3, and L4 spinous processes. No discitis or facet edema. Conus medullaris: Extends to the L1 level and is non edematous. There is extensive epidural collection completely effacing the thecal sac throughout the lumbar spine until L4-5 and below where the collection becomes ventral and right eccentric. The infection communicates with extensive bilateral abscess within the intrinsic back muscles via the interspinous space at L3-4. Patient had recent subcutaneous collection and left buttocks collection drainage, with packing seen in place. This midline, upper subcutaneous collection communicates with the paravertebral abscess along its superior margin based on postcontrast axial images. Paraspinal and other soft tissues: As above.  Distended bladder Disc levels: Chronic bilateral pars defects at L5. Critical Value/emergent results were called by telephone at the time of interpretation on 03/22/2018 at 3:04 pm to Dr. Thedore Mins , who verbally acknowledged these results. IMPRESSION: 1. Epidural abscess from C2 to sacrum as described. Maximal cord compression from T3-4 to T5-6. The thecal sac is completely effaced from L1 to L4-5. At the L3-4 interspinous space the spinal abscess communicates with large bilateral abscesses within the intrinsic back muscles. There is osteomyelitis of the L2, L3, and L4 spinous processes. No discitis or facet arthritis. 2. Left paravertebral abscess along the lower thoracic spine with small left empyema that is new from CT 2 days ago. 3. Distended bladder Electronically Signed   By: Marnee Spring M.D.   On: 03/22/2018 15:11   Dg Chest Port 1 View  Result Date: 03/30/2018 CLINICAL DATA:  Chest pain EXAM: PORTABLE CHEST 1 VIEW COMPARISON:  None. FINDINGS: Retrocardiac opacity, atelectasis versus pneumonia. Possible small left pleural effusion. Right lung is clear. No pneumothorax. The heart is  normal in size. IMPRESSION: Retrocardiac opacity, atelectasis versus pneumonia. Possible small left pleural effusion. Electronically Signed  By: Charline Bills M.D.   On: 03/30/2018 02:49   [place] Ekg Site Rite  Result Date: 03/21/2018 If Site Rite image not attached, placement could not be confirmed due to current cardiac rhythm.    LOS: 10 days   Signature  Susa Raring M.D on 03/30/2018 at 11:17 AM  To page go to [email or web address] - password [facility]

## 2018-03-30 NOTE — Progress Notes (Signed)
Subjective: Patient reports acute hypotension with leg weakness   Objective: Vital signs in last 24 hours: Temp:  [98.3 F (36.8 C)-100.1 F (37.8 C)] 100.1 F (37.8 C) (10/01 1534) Pulse Rate:  [87-117] 87 (10/01 1635) Resp:  [14-18] 16 (10/01 1635) BP: (72-135)/(36-82) 85/45 (10/01 1635) SpO2:  [87 %-100 %] 94 % (10/01 1635)  Intake/Output from previous day: 09/30 0701 - 10/01 0700 In: 1280 [P.O.:1080; IV Piggyback:200] Out: 5000 [Urine:5000] Intake/Output this shift: Total I/O In: 250 [P.O.:240; I.V.:10] Out: 1200 [Urine:1200]  Physical Exam: Full strength both upper extremities all motor groups.  Left leg without movement.  Right leg has weakness, but antigravity HF, PF, DF.  3/5 quads right.  Patient denies numbness in either leg or in perineum.  Lab Results: Recent Labs    03/30/18 0227  WBC 14.5*  HGB 9.2*  HCT 28.6*  PLT 382   BMET Recent Labs    03/30/18 0227  NA 135  K 2.8*  CL 90*  CO2 34*  GLUCOSE 230*  BUN 6  CREATININE 0.68  CALCIUM 8.5*    Studies/Results: Dg Chest Port 1 View  Result Date: 03/30/2018 CLINICAL DATA:  Chest pain EXAM: PORTABLE CHEST 1 VIEW COMPARISON:  None. FINDINGS: Retrocardiac opacity, atelectasis versus pneumonia. Possible small left pleural effusion. Right lung is clear. No pneumothorax. The heart is normal in size. IMPRESSION: Retrocardiac opacity, atelectasis versus pneumonia. Possible small left pleural effusion. Electronically Signed   By: Charline Bills M.D.   On: 03/30/2018 02:49    Assessment/Plan: Patient had full strength in both legs this morning and was standing after hydrotherapy.  He became febrile and hypotensive, then suddenly weak in both legs.  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  I have spoken with Drs.  Lane Hacker and Pool.  Patient will need to be stabilized medically in ICU and treated for hypotension (currently on pressors).  When he is stable, he will need  MRI of C/T/LS spine to follow up on ESA.  If infarct, there will not be a surgical treatment for this.  Dr.  Jordan Likes is aware of these developments.  He is on call tonight.  I will follow patient.    LOS: 10 days    Dorian Heckle, MD 03/30/2018, 4:58 PM

## 2018-03-30 NOTE — Progress Notes (Signed)
Physical Therapy Wound Treatment Patient Details  Name: Tony Long MRN: [id number] Date of Birth: Sep 24, 1975  Today's Date: 03/30/2018 Time: 7741-2878 Time Calculation (min): 70 min  Subjective  Subjective: Pleasant and agreeable to hydrotherapy Patient and Family Stated Goals: Heal wound Prior Treatments: 03/22/18 I&D lumbar wounds  Pain Score:  Pt appeared to be moderately painful during treatment session however did not ask for additional pain medication.  Wound Assessment  Wound / Incision (Open or Dehisced) 03/23/18 Incision - Open Lumbar Upper (Active)  Wound Image   03/30/2018  2:24 PM  Dressing Type ABD;Gauze (Comment);Barrier Film (skin prep);Moist to dry 03/30/2018  2:24 PM  Dressing Changed Changed 03/30/2018  2:24 PM  Dressing Status Clean;Dry;Intact 03/30/2018  2:24 PM  Dressing Change Frequency Daily 03/30/2018  2:24 PM  Site / Wound Assessment Pink;Red;Yellow 03/30/2018  2:24 PM  % Wound base Red or Granulating 85% 03/30/2018  2:24 PM  % Wound base Yellow/Fibrinous Exudate 15% 03/30/2018  2:24 PM  % Wound base Black/Eschar 0% 03/30/2018  2:24 PM  % Wound base Other/Granulation Tissue (Comment) 0% 03/30/2018  2:24 PM  Peri-wound Assessment Intact 03/30/2018  2:24 PM  Wound Length (cm) 3 cm 03/30/2018  2:24 PM  Wound Width (cm) 1.7 cm 03/30/2018  2:24 PM  Wound Depth (cm) 1 cm 03/30/2018  2:24 PM  Wound Volume (cm^3) 5.1 cm^3 03/30/2018  2:24 PM  Wound Surface Area (cm^2) 5.1 cm^2 03/30/2018  2:24 PM  Undermining (cm) 3.0 cm at 12:00, 1.8 cm at 2:00, 3.2 cm at 3:00, 3.3 cm from 4:00-8:00, 5.4 cm at 11:00 03/30/2018  2:24 PM  Margins Unattached edges (unapproximated) 03/30/2018  2:24 PM  Closure None 03/30/2018  2:24 PM  Drainage Amount Minimal 03/30/2018  2:24 PM  Drainage Description Purulent 03/30/2018  2:24 PM  Treatment Debridement (Selective);Hydrotherapy (Pulse lavage);Packing (Saline gauze) 03/30/2018  2:24 PM     Wound / Incision (Open or Dehisced) 03/23/18 Incision - Open  Lumbar Lower (Active)  Wound Image   03/30/2018  2:24 PM  Dressing Type Abdominal binder;Gauze (Comment);Moist to dry;Barrier Film (skin prep) 03/30/2018  2:24 PM  Dressing Changed Changed 03/30/2018  2:24 PM  Dressing Status Clean;Dry;Intact 03/30/2018  2:24 PM  Dressing Change Frequency Daily 03/30/2018  2:24 PM  Site / Wound Assessment Clean;Dry 03/30/2018  2:24 PM  % Wound base Red or Granulating 30% 03/30/2018  2:24 PM  % Wound base Yellow/Fibrinous Exudate 30% 03/30/2018  2:24 PM  % Wound base Black/Eschar 0% 03/30/2018  2:24 PM  % Wound base Other/Granulation Tissue (Comment) 40% 03/30/2018  2:24 PM  Peri-wound Assessment Pink 03/30/2018  2:24 PM  Wound Length (cm) 3.7 cm 03/30/2018  2:24 PM  Wound Width (cm) 2 cm 03/30/2018  2:24 PM  Wound Depth (cm) 1.7 cm 03/30/2018  2:24 PM  Wound Volume (cm^3) 12.58 cm^3 03/30/2018  2:24 PM  Wound Surface Area (cm^2) 7.4 cm^2 03/30/2018  2:24 PM  Undermining (cm) 3.4 cm from 1:00-2:00, 3.1 cm at 3:00, 3.9 cm from 3:00-5:00, 2.5 cm at 6:00, 2.8 cm from 6:00-8:00, 4.6 cm at 9:00, 6.1 cm at 11:00, and 1.7 cm at 12:00 03/30/2018  2:24 PM  Margins Unattached edges (unapproximated) 03/30/2018  2:24 PM  Closure None 03/30/2018  2:24 PM  Drainage Amount Moderate 03/30/2018  2:24 PM  Drainage Description Purulent 03/30/2018  2:24 PM  Treatment Debridement (Selective);Hydrotherapy (Pulse lavage);Packing (Saline gauze) 03/30/2018  2:24 PM     Incision (Closed) 03/22/18 Back Left (Active)  Dressing Type ABD;Tape dressing  03/30/2018  8:00 AM  Dressing Clean;Dry;Intact 03/30/2018  8:00 AM  Dressing Change Frequency Other (Comment) 03/30/2018  8:00 AM  Site / Wound Assessment Dressing in place / Unable to assess 03/30/2018  8:00 AM  Drainage Amount None 03/30/2018  8:00 AM  Drainage Description Serosanguineous 03/27/2018  9:25 AM  Treatment Cleansed 03/22/2018  8:32 PM   Hydrotherapy Pulsed lavage therapy - wound location: x2 lumbar wounds Pulsed Lavage with Suction (psi): 12  psi Pulsed Lavage with Suction - Normal Saline Used: 1000 mL Pulsed Lavage Tip: Tip with splash shield Selective Debridement Selective Debridement - Location: x2 lumbar wounds Selective Debridement - Tools Used: Forceps;Scissors Selective Debridement - Tissue Removed: yellow necrotic/unviable tissue   Wound Assessment and Plan  Wound Therapy - Assess/Plan/Recommendations Wound Therapy - Clinical Statement: Inferior wound very wet with purulent drainage this session however overall amount that was draining during session appeared decreased. Pt tolerated treatment well although pain seemed to be a little elevated with packing. Pt continues to benefit from hydrotherapy to reduce bioburden and promote wound healing.   Wound Therapy - Functional Problem List: Acute pain, decreased tolerance for position changes Factors Delaying/Impairing Wound Healing: Diabetes Mellitus;Infection - systemic/local Hydrotherapy Plan: Debridement;Dressing change;Patient/family education;Pulsatile lavage with suction Wound Therapy - Frequency: 6X / week Wound Therapy - Current Recommendations: PT Wound Therapy - Follow Up Recommendations: Home health RN Wound Plan: See above  Wound Therapy Goals- Improve the function of patient's integumentary system by progressing the wound(s) through the phases of wound healing (inflammation - proliferation - remodeling) by: Decrease Necrotic Tissue to: 0% both wounds Decrease Necrotic Tissue - Progress: Progressing toward goal Increase Granulation Tissue to: 100% both wounds Increase Granulation Tissue - Progress: Progressing toward goal Improve Drainage Characteristics: Min;Serous Improve Drainage Characteristics - Progress: Progressing toward goal Goals/treatment plan/discharge plan were made with and agreed upon by patient/family: Yes Time For Goal Achievement: 7 days Wound Therapy - Potential for Goals: Good  Goals will be updated until maximal potential achieved or  discharge criteria met.  Discharge criteria: when goals achieved, discharge from hospital, MD decision/surgical intervention, no progress towards goals, refusal/missing three consecutive treatments without notification or medical reason.  GP     Thelma Comp 03/30/2018, 2:38 PM   Rolinda Roan, PT, DPT Acute Rehabilitation Services Pager: [phone] Office: [phone]

## 2018-03-31 DIAGNOSIS — I959 Hypotension, unspecified: Secondary | ICD-10-CM

## 2018-03-31 DIAGNOSIS — F1120 Opioid dependence, uncomplicated: Secondary | ICD-10-CM

## 2018-03-31 LAB — LIPID PANEL
CHOL/HDL RATIO: 3.2 ratio
CHOLESTEROL: 74 mg/dL (ref 0–200)
HDL: 23 mg/dL — ABNORMAL LOW (ref >40)
LDL Cholesterol: 41 mg/dL (ref 0–99)
Triglycerides: 48 mg/dL (ref <150)
VLDL: 10 mg/dL (ref 0–40)

## 2018-03-31 LAB — BASIC METABOLIC PANEL
Anion gap: 10 (ref 5–15)
BUN: 9 mg/dL (ref 6–20)
CALCIUM: 7.7 mg/dL — AB (ref 8.9–10.3)
CO2: 31 mmol/L (ref 22–32)
CREATININE: 0.62 mg/dL (ref 0.61–1.24)
Chloride: 94 mmol/L — ABNORMAL LOW (ref 98–111)
GFR calc non Af Amer: >60 mL/min (ref >60)
Glucose, Bld: 266 mg/dL — ABNORMAL HIGH (ref 70–99)
Potassium: 3.7 mmol/L (ref 3.5–5.1)
SODIUM: 135 mmol/L (ref 135–145)

## 2018-03-31 LAB — MAGNESIUM: MAGNESIUM: 2 mg/dL (ref 1.7–2.4)

## 2018-03-31 LAB — CBC
HEMATOCRIT: 23.7 % — AB (ref 39.0–52.0)
Hemoglobin: 7.5 g/dL — ABNORMAL LOW (ref 13.0–17.0)
MCH: 30.7 pg (ref 26.0–34.0)
MCHC: 31.6 g/dL (ref 30.0–36.0)
MCV: 97.1 fL (ref 78.0–100.0)
PLATELETS: 358 10*3/uL (ref 150–400)
RBC: 2.44 MIL/uL — AB (ref 4.22–5.81)
RDW: 13 % (ref 11.5–15.5)
WBC: 24.8 10*3/uL — AB (ref 4.0–10.5)

## 2018-03-31 LAB — GLUCOSE, CAPILLARY
GLUCOSE-CAPILLARY: 265 mg/dL — AB (ref 70–99)
Glucose-Capillary: 349 mg/dL — ABNORMAL HIGH (ref 70–99)
Glucose-Capillary: 273 mg/dL — ABNORMAL HIGH (ref 70–99)
Glucose-Capillary: 292 mg/dL — ABNORMAL HIGH (ref 70–99)

## 2018-03-31 LAB — PHOSPHORUS: Phosphorus: 2.7 mg/dL (ref 2.5–4.6)

## 2018-03-31 LAB — PROCALCITONIN: Procalcitonin: 0.49 ng/mL

## 2018-03-31 MED ORDER — LORAZEPAM 2 MG/ML IJ SOLN: 1.0000 mg | Freq: Once | INTRAMUSCULAR | Status: DC | PRN

## 2018-03-31 MED ORDER — HALOPERIDOL LACTATE 5 MG/ML IJ SOLN
5.0000 mg | Freq: Once | INTRAMUSCULAR | Status: AC
  Administered 2018-03-31: 5 mg via INTRAVENOUS

## 2018-03-31 MED ORDER — HALOPERIDOL LACTATE 5 MG/ML IJ SOLN
INTRAMUSCULAR | Status: AC
  Filled 2018-03-31: qty 1

## 2018-03-31 MED ORDER — SODIUM CHLORIDE 0.9 % IV SOLN
INTRAVENOUS | Status: DC
  Administered 2018-03-31: 17:00:00 via INTRAVENOUS

## 2018-03-31 NOTE — Progress Notes (Signed)
Subjective: Patient reports confused, but significant improvement in bilateral leg weakness.  Objective: Vital signs in last 24 hours: Temp:  [98.1 F (36.7 C)-103 F (39.4 C)] 99 F (37.2 C) (10/02 0732) Pulse Rate:  [72-121] 82 (10/02 0700) Resp:  [7-28] 9 (10/02 0700) BP: (72-135)/(36-74) 92/57 (10/02 0700) SpO2:  [86 %-97 %] 95 % (10/02 0700)  Intake/Output from previous day: 10/01 0701 - 10/02 0700 In: 4000 [P.O.:960; I.V.:1202.2; IV Piggyback:1837.9] Out: 3675 [Urine:3675] Intake/Output this shift: No intake/output data recorded.  Physical Exam: Patient's upper extremity motor exam remains normal.  His legs are significantly improved.  He has good strength in both legs now and >AG all groups with strong PF/DF bilaterally, HF, quads, hamstrings as well.  He denies numbness.    Lab Results: Recent Labs    03/30/18 1837 03/31/18 0336  WBC 25.2* 24.8*  HGB 7.6* 7.5*  HCT 24.3* 23.7*  PLT 365 358   BMET Recent Labs    03/30/18 0227  NA 135  K 2.8*  CL 90*  CO2 34*  GLUCOSE 230*  BUN 6  CREATININE 0.68  CALCIUM 8.5*    Studies/Results: Ct Angio Chest Pe W Or Wo Contrast  Result Date: 03/30/2018 CLINICAL DATA:  Shortness of breath PE suspected, high pretest prob. Patient with known epidural abscess and endocarditis. Now with left-sided chest pain. EXAM: CT ANGIOGRAPHY CHEST WITH CONTRAST TECHNIQUE: Multidetector CT imaging of the chest was performed using the standard protocol during bolus administration of intravenous contrast. Multiplanar CT image reconstructions and MIPs were obtained to evaluate the vascular anatomy. CONTRAST:  54mL ISOVUE-370 IOPAMIDOL (ISOVUE-370) INJECTION 76% COMPARISON:  Chest radiographs earlier this day. FINDINGS: Cardiovascular: There are no filling defects within the pulmonary arteries to suggest pulmonary embolus. The thoracic aorta is normal in caliber without dissection. Mild cardiomegaly. Small amount pericardial fluid.  Mediastinum/Nodes: Loculated left pleural effusion tracks along the left aspect of the mediastinum. Small left hilar nodes without bulky adenopathy. No enlarged mediastinal nodes. No dominant thyroid nodule. The esophagus is decompressed. Lungs/Pleura: Moderate left pleural effusion is loculated and tracks along the posterior, lateral, and medial hemithorax. Adjacent airspace disease in the left lower lobe, favoring compressive atelectasis, with adjacent compressive atelectasis in the right upper lobe. Pleural thickening versus loculated pleural effusion the upper medial right hemithorax, with additional areas of pleural thickening dependently. Dependent atelectasis in the right lower lobe. Trachea and bronchi are patent. Upper Abdomen: No acute findings. Musculoskeletal: Patient with known epidural abscess, not well delineated by CT. No bony destructive change. Review of the MIP images confirms the above findings. IMPRESSION: 1. No pulmonary embolus. 2. Moderate loculated left pleural effusion. Adjacent airspace disease likely compressive atelectasis, pneumonia not excluded in the setting of spinal infection and endocarditis. Mild right pleural thickening or loculated pleural fluid in the upper right hemithorax. Electronically Signed   By: Narda Rutherford M.D.   On: 03/30/2018 23:21   Mr Cervical Spine Wo Contrast  Result Date: 03/30/2018 CLINICAL DATA:  42 year old male with history of MSSA bacteremia with known endocarditis and epidural abscess extending from C2 through the sacrum. Recent episode of hypotension and acute lower extremity weakness. Evaluate abscess and possible spinal cord infarct. EXAM: MRI CERVICAL, THORACIC SPINE WITHOUT CONTRAST TECHNIQUE: Multiplanar and multiecho pulse sequences of the cervical spine, to include the craniocervical junction and cervicothoracic junction, and thoracic and lumbar spine, were obtained without intravenous contrast. COMPARISON:  Prior MRI from 03/22/2018.  FINDINGS: MRI CERVICAL SPINE FINDINGS Alignment: Examination technically limited as the  patient was unable to tolerate the full length of the exam. Additionally, images provided are degraded by motion artifact. Reversal of the normal cervical lordosis with apex at C5, stable. No interval listhesis or malalignment. Vertebrae: Vertebral body height maintained without acute or interval fracture. Bone marrow signal intensity diffusely decreased on T1 weighted imaging, suspected to be related to anemia and chronic disease. No discrete osseous lesions. No abnormal marrow edema. No evidence for interval discitis. Cord: Signal intensity within the cervical spinal cord is within normal limits. No findings to suggest interval cord infarction on this motion degraded and limited exam. Previously identified epidural collection involving the ventral epidural space extending from C2 inferiorly is decreased in size, now measuring up to 7 mm in maximal AP diameter at the level of C2. Collection diffusely involves the ventral epidural space, but also is seen dorsally as well. Slightly improved diffuse spinal stenosis with thecal sac patency. Posterior Fossa, vertebral arteries, paraspinal tissues: Visualized brain and posterior fossa within normal limits. Craniocervical junction normal. Scattered edema within the posterior paraspinous soft tissues. Normal intravascular flow voids seen within the vertebral arteries bilaterally. Disc levels: C2-C3: Unremarkable. C3-C4: Small left foraminal protrusion with associated moderate left C4 foraminal stenosis. C4-C5:  Unremarkable. C5-C6: Left eccentric disc bulge with uncovertebral hypertrophy. Moderate left C6 foraminal narrowing. C6-C7: Right foraminal disc protrusion with associated moderate right C7 foraminal stenosis. C7-T1:  Unremarkable. MRI THORACIC SPINE FINDINGS Alignment: Examination markedly limited as the patient was unable to tolerate the full length of the exam. Sagittal T1,  T2, and STIR sequences only were performed. No axial images obtained. Vertebral bodies normally aligned with preservation of the normal thoracic kyphosis. Vertebrae: Vertebral body height maintained without evidence for interval fracture. Butterfly vertebra noted at T12. Diffusely decreased T1 weighted signal intensity throughout the visualized bone marrow, like related to anemia chronic disease. No findings to suggest interval or new discitis or septic arthritis. Cord: Signal intensity within the thoracic spinal cord grossly within normal limits on these limited sagittal views. No definite cord signal abnormality or cord edema to suggest acute spinal cord infarction. Previously seen diffuse epidural collection appears overall decreased in size from previous, with improved spinal stenosis and thecal sac patency. Paraspinal and other soft tissues: Scattered edema seen within the posterior paraspinous soft tissues at the upper back/cervicothoracic junction. No discrete soft tissue collections. Disc levels: T5-6: Central disc protrusion indenting upon the ventral thoracic spinal cord, stable. IMPRESSION: 1. Technically limited exam due to motion artifact and the patient's inability to tolerate the full length of the exam. Cervical and thoracic spine only was imaged, and only sagittal sequences of the thoracic spine were obtained. 2. No imaging findings to suggest spinal cord infarction identified on this limited exam. 3. Interval improvement in diffuse epidural collection, decreased in size as compared to previous exam with improved diffuse spinal stenosis and thecal sac patency. No new discitis or facet arthritis. Electronically Signed   By: Rise Mu M.D.   On: 03/30/2018 22:52   Mr Thoracic Spine Wo Contrast  Result Date: 03/30/2018 CLINICAL DATA:  42 year old male with history of MSSA bacteremia with known endocarditis and epidural abscess extending from C2 through the sacrum. Recent episode of  hypotension and acute lower extremity weakness. Evaluate abscess and possible spinal cord infarct. EXAM: MRI CERVICAL, THORACIC SPINE WITHOUT CONTRAST TECHNIQUE: Multiplanar and multiecho pulse sequences of the cervical spine, to include the craniocervical junction and cervicothoracic junction, and thoracic and lumbar spine, were obtained without intravenous contrast. COMPARISON:  Prior MRI from 03/22/2018. FINDINGS: MRI CERVICAL SPINE FINDINGS Alignment: Examination technically limited as the patient was unable to tolerate the full length of the exam. Additionally, images provided are degraded by motion artifact. Reversal of the normal cervical lordosis with apex at C5, stable. No interval listhesis or malalignment. Vertebrae: Vertebral body height maintained without acute or interval fracture. Bone marrow signal intensity diffusely decreased on T1 weighted imaging, suspected to be related to anemia and chronic disease. No discrete osseous lesions. No abnormal marrow edema. No evidence for interval discitis. Cord: Signal intensity within the cervical spinal cord is within normal limits. No findings to suggest interval cord infarction on this motion degraded and limited exam. Previously identified epidural collection involving the ventral epidural space extending from C2 inferiorly is decreased in size, now measuring up to 7 mm in maximal AP diameter at the level of C2. Collection diffusely involves the ventral epidural space, but also is seen dorsally as well. Slightly improved diffuse spinal stenosis with thecal sac patency. Posterior Fossa, vertebral arteries, paraspinal tissues: Visualized brain and posterior fossa within normal limits. Craniocervical junction normal. Scattered edema within the posterior paraspinous soft tissues. Normal intravascular flow voids seen within the vertebral arteries bilaterally. Disc levels: C2-C3: Unremarkable. C3-C4: Small left foraminal protrusion with associated moderate left C4  foraminal stenosis. C4-C5:  Unremarkable. C5-C6: Left eccentric disc bulge with uncovertebral hypertrophy. Moderate left C6 foraminal narrowing. C6-C7: Right foraminal disc protrusion with associated moderate right C7 foraminal stenosis. C7-T1:  Unremarkable. MRI THORACIC SPINE FINDINGS Alignment: Examination markedly limited as the patient was unable to tolerate the full length of the exam. Sagittal T1, T2, and STIR sequences only were performed. No axial images obtained. Vertebral bodies normally aligned with preservation of the normal thoracic kyphosis. Vertebrae: Vertebral body height maintained without evidence for interval fracture. Butterfly vertebra noted at T12. Diffusely decreased T1 weighted signal intensity throughout the visualized bone marrow, like related to anemia chronic disease. No findings to suggest interval or new discitis or septic arthritis. Cord: Signal intensity within the thoracic spinal cord grossly within normal limits on these limited sagittal views. No definite cord signal abnormality or cord edema to suggest acute spinal cord infarction. Previously seen diffuse epidural collection appears overall decreased in size from previous, with improved spinal stenosis and thecal sac patency. Paraspinal and other soft tissues: Scattered edema seen within the posterior paraspinous soft tissues at the upper back/cervicothoracic junction. No discrete soft tissue collections. Disc levels: T5-6: Central disc protrusion indenting upon the ventral thoracic spinal cord, stable. IMPRESSION: 1. Technically limited exam due to motion artifact and the patient's inability to tolerate the full length of the exam. Cervical and thoracic spine only was imaged, and only sagittal sequences of the thoracic spine were obtained. 2. No imaging findings to suggest spinal cord infarction identified on this limited exam. 3. Interval improvement in diffuse epidural collection, decreased in size as compared to previous exam  with improved diffuse spinal stenosis and thecal sac patency. No new discitis or facet arthritis. Electronically Signed   By: Rise Mu M.D.   On: 03/30/2018 22:52   Dg Chest Port 1 View  Result Date: 03/30/2018 CLINICAL DATA:  Shortness of breath EXAM: PORTABLE CHEST 1 VIEW COMPARISON:  03/30/2018 at 0227 hours FINDINGS: Moderate layering left pleural effusion, increased. Left lower lobe opacity, atelectasis versus pneumonia. Right lung is clear.  No pneumothorax. The heart is normal in size. IMPRESSION: Moderate layering left pleural effusion, increased. Left lower lobe opacity, atelectasis versus pneumonia. Electronically  Signed   By: Charline Bills M.D.   On: 03/30/2018 19:06   Dg Chest Port 1 View  Result Date: 03/30/2018 CLINICAL DATA:  Chest pain EXAM: PORTABLE CHEST 1 VIEW COMPARISON:  None. FINDINGS: Retrocardiac opacity, atelectasis versus pneumonia. Possible small left pleural effusion. Right lung is clear. No pneumothorax. The heart is normal in size. IMPRESSION: Retrocardiac opacity, atelectasis versus pneumonia. Possible small left pleural effusion. Electronically Signed   By: Charline Bills M.D.   On: 03/30/2018 02:49    Assessment/Plan: Marked improvement in motor exam.  MRI incomplete, but shows improvement in ESA.  No suggestion of cord infarct.  Sepsis and hypotension improved with change in antibiotics.  Continue to monitor.  Repeat lumbar MRI if patient can tolerate.  He has reportedly had incontinence of stool, but has been disoriented (thought he was in [place]).  Continue to monitor patient.    LOS: 11 days    Dorian Heckle, MD 03/31/2018, 8:14 AM

## 2018-03-31 NOTE — Progress Notes (Addendum)
INFECTIOUS DISEASE PROGRESS NOTE  ID: Tony Long is a 42 y.o. male with  Principal Problem:   Opioid use disorder, severe, dependence (HCC) Active Problems:   Non compliance w medication regimen   Chronic hepatitis C without hepatic coma (HCC)   Osteomyelitis (HCC)   Back abscess   Bacteremia due to Gram-positive bacteria   Chest pain on breathing  Subjective: No complaints.   Abtx:  Anti-infectives (From admission, onward)   Start     Dose/Rate Route Frequency Ordered Stop   03/31/18 0200  vancomycin (VANCOCIN) IVPB 750 mg/150 ml premix     750 mg 150 mL/hr over 60 Minutes Intravenous Every 8 hours 03/30/18 1628     03/31/18 0100  meropenem (MERREM) 1 g in sodium chloride 0.9 % 100 mL IVPB     1 g 200 mL/hr over 30 Minutes Intravenous Every 8 hours 03/30/18 1628     03/30/18 1630  vancomycin (VANCOCIN) 1,500 mg in sodium chloride 0.9 % 500 mL IVPB     1,500 mg 250 mL/hr over 120 Minutes Intravenous NOW 03/30/18 1619 03/30/18 1925   03/30/18 1630  meropenem (MERREM) 2 g in sodium chloride 0.9 % 100 mL IVPB     2 g 200 mL/hr over 30 Minutes Intravenous NOW 03/30/18 1619 03/30/18 1754   03/21/18 0930  ceFAZolin (ANCEF) IVPB 2g/100 mL premix  Status:  Discontinued     2 g 200 mL/hr over 30 Minutes Intravenous Every 8 hours 03/21/18 0920 03/30/18 1616      Medications:  Scheduled: . aspirin  81 mg Oral Daily  . atorvastatin  40 mg Oral q1800  . gabapentin  300 mg Oral TID  . heparin injection (subcutaneous)  5,000 Units Subcutaneous Q8H  . hydrocortisone sod succinate (SOLU-CORTEF) inj  100 mg Intravenous Q8H  . insulin aspart  0-5 Units Subcutaneous QHS  . insulin aspart  0-9 Units Subcutaneous TID WC  . insulin glargine  25 Units Subcutaneous QHS  . iopamidol      . mouth rinse  15 mL Mouth Rinse BID  . nicotine  21 mg Transdermal Daily  . polyethylene glycol  17 g Oral BID  . senna-docusate  2 tablet Oral QHS  . sodium chloride flush  10-40 mL Intracatheter  Q12H  . tamsulosin  0.4 mg Oral Daily    Objective: Vital signs in last 24 hours: Temp:  [98.1 F (36.7 C)-103 F (39.4 C)] 99 F (37.2 C) (10/02 0732) Pulse Rate:  [72-121] 104 (10/02 0800) Resp:  [7-28] 15 (10/02 0800) BP: (72-123)/(36-73) 105/66 (10/02 0800) SpO2:  [86 %-97 %] 90 % (10/02 0800)   General appearance: alert, cooperative and no distress Resp: clear to auscultation bilaterally Cardio: regular rate and rhythm GI: normal findings: bowel sounds normal and soft, non-tender Extremities: no edema. moves LE without difficulty (plantar flex, lifts legs)  Lab Results Recent Labs    03/30/18 0227 03/30/18 1837 03/31/18 0336  WBC 14.5* 25.2* 24.8*  HGB 9.2* 7.6* 7.5*  HCT 28.6* 24.3* 23.7*  NA 135  --   --   K 2.8*  --   --   CL 90*  --   --   CO2 34*  --   --   BUN 6  --   --   CREATININE 0.68  --   --    Liver Panel No results for input(s): PROT, ALBUMIN, AST, ALT, ALKPHOS, BILITOT, BILIDIR, IBILI in the last 72 hours. Sedimentation Rate No results for  input(s): ESRSEDRATE in the last 72 hours. C-Reactive Protein No results for input(s): CRP in the last 72 hours.  Microbiology: Recent Results (from the past 240 hour(s))  Anaerobic culture     Status: None   Collection Time: 03/22/18 10:36 AM  Result Value Ref Range Status   Specimen Description ABSCESS BACK  Final   Special Requests NONE  Final   Gram Stain   Final    RARE WBC PRESENT, PREDOMINANTLY MONONUCLEAR ABUNDANT GRAM POSITIVE COCCI    Culture   Final    NO ANAEROBES ISOLATED Performed at [facility], [address] [place], [place] [address]    Report Status 03/27/2018 FINAL  Final  Aerobic Culture (superficial specimen)     Status: None   Collection Time: 03/22/18 10:36 AM  Result Value Ref Range Status   Specimen Description ABSCESS BACK  Final   Special Requests NONE  Final   Gram Stain   Final    FEW WBC PRESENT,BOTH PMN AND MONONUCLEAR MODERATE GRAM POSITIVE  COCCI Performed at [name], [address] [place], [place] [address]    Culture ABUNDANT STAPHYLOCOCCUS AUREUS  Final   Report Status 03/24/2018 FINAL  Final   Organism ID, Bacteria STAPHYLOCOCCUS AUREUS  Final      Susceptibility   Staphylococcus aureus - MIC*    CIPROFLOXACIN <=0.5 SENSITIVE Sensitive     ERYTHROMYCIN <=0.25 SENSITIVE Sensitive     GENTAMICIN <=0.5 SENSITIVE Sensitive     OXACILLIN 0.5 SENSITIVE Sensitive     TETRACYCLINE <=1 SENSITIVE Sensitive     VANCOMYCIN 1 SENSITIVE Sensitive     TRIMETH/SULFA <=10 SENSITIVE Sensitive     CLINDAMYCIN <=0.25 SENSITIVE Sensitive     RIFAMPIN <=0.5 SENSITIVE Sensitive     Inducible Clindamycin NEGATIVE Sensitive     * ABUNDANT STAPHYLOCOCCUS AUREUS  Culture, blood (Routine X 2) w Reflex to ID Panel     Status: None   Collection Time: 03/23/18  4:39 AM  Result Value Ref Range Status   Specimen Description BLOOD RIGHT HAND  Final   Special Requests   Final    BOTTLES DRAWN AEROBIC ONLY Blood Culture adequate volume   Culture   Final    NO GROWTH 5 DAYS Performed at [facility], [address], [place], [place] [address]    Report Status 03/28/2018 FINAL  Final  Culture, blood (single)     Status: None (Preliminary result)   Collection Time: 03/30/18  4:12 PM  Result Value Ref Range Status   Specimen Description BLOOD RIGHT ANTECUBITAL  Final   Special Requests   Final    BOTTLES DRAWN AEROBIC ONLY Blood Culture adequate volume   Culture   Final    NO GROWTH < 24 HOURS Performed at [facility], [address], [place], [place] [address]    Report Status PENDING  Incomplete    Studies/Results: Ct Angio Chest Pe W Or Wo Contrast  Result Date: 03/30/2018 CLINICAL DATA:  Shortness of breath PE suspected, high pretest prob. Patient with known epidural abscess and endocarditis. Now with left-sided chest pain. EXAM: CT ANGIOGRAPHY CHEST WITH CONTRAST TECHNIQUE: Multidetector CT imaging of the chest  was performed using the standard protocol during bolus administration of intravenous contrast. Multiplanar CT image reconstructions and MIPs were obtained to evaluate the vascular anatomy. CONTRAST:  54mL ISOVUE-370 IOPAMIDOL (ISOVUE-370) INJECTION 76% COMPARISON:  Chest radiographs earlier this day. FINDINGS: Cardiovascular: There are no filling defects within the  pulmonary arteries to suggest pulmonary embolus. The thoracic aorta is normal in caliber without dissection. Mild cardiomegaly. Small amount pericardial fluid. Mediastinum/Nodes: Loculated left pleural effusion tracks along the left aspect of the mediastinum. Small left hilar nodes without bulky adenopathy. No enlarged mediastinal nodes. No dominant thyroid nodule. The esophagus is decompressed. Lungs/Pleura: Moderate left pleural effusion is loculated and tracks along the posterior, lateral, and medial hemithorax. Adjacent airspace disease in the left lower lobe, favoring compressive atelectasis, with adjacent compressive atelectasis in the right upper lobe. Pleural thickening versus loculated pleural effusion the upper medial right hemithorax, with additional areas of pleural thickening dependently. Dependent atelectasis in the right lower lobe. Trachea and bronchi are patent. Upper Abdomen: No acute findings. Musculoskeletal: Patient with known epidural abscess, not well delineated by CT. No bony destructive change. Review of the MIP images confirms the above findings. IMPRESSION: 1. No pulmonary embolus. 2. Moderate loculated left pleural effusion. Adjacent airspace disease likely compressive atelectasis, pneumonia not excluded in the setting of spinal infection and endocarditis. Mild right pleural thickening or loculated pleural fluid in the upper right hemithorax. Electronically Signed   By: Narda Rutherford M.D.   On: 03/30/2018 23:21   Mr Cervical Spine Wo Contrast  Result Date: 03/30/2018 CLINICAL DATA:  42 year old male with history of MSSA  bacteremia with known endocarditis and epidural abscess extending from C2 through the sacrum. Recent episode of hypotension and acute lower extremity weakness. Evaluate abscess and possible spinal cord infarct. EXAM: MRI CERVICAL, THORACIC SPINE WITHOUT CONTRAST TECHNIQUE: Multiplanar and multiecho pulse sequences of the cervical spine, to include the craniocervical junction and cervicothoracic junction, and thoracic and lumbar spine, were obtained without intravenous contrast. COMPARISON:  Prior MRI from 03/22/2018. FINDINGS: MRI CERVICAL SPINE FINDINGS Alignment: Examination technically limited as the patient was unable to tolerate the full length of the exam. Additionally, images provided are degraded by motion artifact. Reversal of the normal cervical lordosis with apex at C5, stable. No interval listhesis or malalignment. Vertebrae: Vertebral body height maintained without acute or interval fracture. Bone marrow signal intensity diffusely decreased on T1 weighted imaging, suspected to be related to anemia and chronic disease. No discrete osseous lesions. No abnormal marrow edema. No evidence for interval discitis. Cord: Signal intensity within the cervical spinal cord is within normal limits. No findings to suggest interval cord infarction on this motion degraded and limited exam. Previously identified epidural collection involving the ventral epidural space extending from C2 inferiorly is decreased in size, now measuring up to 7 mm in maximal AP diameter at the level of C2. Collection diffusely involves the ventral epidural space, but also is seen dorsally as well. Slightly improved diffuse spinal stenosis with thecal sac patency. Posterior Fossa, vertebral arteries, paraspinal tissues: Visualized brain and posterior fossa within normal limits. Craniocervical junction normal. Scattered edema within the posterior paraspinous soft tissues. Normal intravascular flow voids seen within the vertebral arteries  bilaterally. Disc levels: C2-C3: Unremarkable. C3-C4: Small left foraminal protrusion with associated moderate left C4 foraminal stenosis. C4-C5:  Unremarkable. C5-C6: Left eccentric disc bulge with uncovertebral hypertrophy. Moderate left C6 foraminal narrowing. C6-C7: Right foraminal disc protrusion with associated moderate right C7 foraminal stenosis. C7-T1:  Unremarkable. MRI THORACIC SPINE FINDINGS Alignment: Examination markedly limited as the patient was unable to tolerate the full length of the exam. Sagittal T1, T2, and STIR sequences only were performed. No axial images obtained. Vertebral bodies normally aligned with preservation of the normal thoracic kyphosis. Vertebrae: Vertebral body height maintained without evidence for interval  fracture. Butterfly vertebra noted at T12. Diffusely decreased T1 weighted signal intensity throughout the visualized bone marrow, like related to anemia chronic disease. No findings to suggest interval or new discitis or septic arthritis. Cord: Signal intensity within the thoracic spinal cord grossly within normal limits on these limited sagittal views. No definite cord signal abnormality or cord edema to suggest acute spinal cord infarction. Previously seen diffuse epidural collection appears overall decreased in size from previous, with improved spinal stenosis and thecal sac patency. Paraspinal and other soft tissues: Scattered edema seen within the posterior paraspinous soft tissues at the upper back/cervicothoracic junction. No discrete soft tissue collections. Disc levels: T5-6: Central disc protrusion indenting upon the ventral thoracic spinal cord, stable. IMPRESSION: 1. Technically limited exam due to motion artifact and the patient's inability to tolerate the full length of the exam. Cervical and thoracic spine only was imaged, and only sagittal sequences of the thoracic spine were obtained. 2. No imaging findings to suggest spinal cord infarction identified on  this limited exam. 3. Interval improvement in diffuse epidural collection, decreased in size as compared to previous exam with improved diffuse spinal stenosis and thecal sac patency. No new discitis or facet arthritis. Electronically Signed   By: Rise Mu M.D.   On: 03/30/2018 22:52   Mr Thoracic Spine Wo Contrast  Result Date: 03/30/2018 CLINICAL DATA:  42 year old male with history of MSSA bacteremia with known endocarditis and epidural abscess extending from C2 through the sacrum. Recent episode of hypotension and acute lower extremity weakness. Evaluate abscess and possible spinal cord infarct. EXAM: MRI CERVICAL, THORACIC SPINE WITHOUT CONTRAST TECHNIQUE: Multiplanar and multiecho pulse sequences of the cervical spine, to include the craniocervical junction and cervicothoracic junction, and thoracic and lumbar spine, were obtained without intravenous contrast. COMPARISON:  Prior MRI from 03/22/2018. FINDINGS: MRI CERVICAL SPINE FINDINGS Alignment: Examination technically limited as the patient was unable to tolerate the full length of the exam. Additionally, images provided are degraded by motion artifact. Reversal of the normal cervical lordosis with apex at C5, stable. No interval listhesis or malalignment. Vertebrae: Vertebral body height maintained without acute or interval fracture. Bone marrow signal intensity diffusely decreased on T1 weighted imaging, suspected to be related to anemia and chronic disease. No discrete osseous lesions. No abnormal marrow edema. No evidence for interval discitis. Cord: Signal intensity within the cervical spinal cord is within normal limits. No findings to suggest interval cord infarction on this motion degraded and limited exam. Previously identified epidural collection involving the ventral epidural space extending from C2 inferiorly is decreased in size, now measuring up to 7 mm in maximal AP diameter at the level of C2. Collection diffusely involves  the ventral epidural space, but also is seen dorsally as well. Slightly improved diffuse spinal stenosis with thecal sac patency. Posterior Fossa, vertebral arteries, paraspinal tissues: Visualized brain and posterior fossa within normal limits. Craniocervical junction normal. Scattered edema within the posterior paraspinous soft tissues. Normal intravascular flow voids seen within the vertebral arteries bilaterally. Disc levels: C2-C3: Unremarkable. C3-C4: Small left foraminal protrusion with associated moderate left C4 foraminal stenosis. C4-C5:  Unremarkable. C5-C6: Left eccentric disc bulge with uncovertebral hypertrophy. Moderate left C6 foraminal narrowing. C6-C7: Right foraminal disc protrusion with associated moderate right C7 foraminal stenosis. C7-T1:  Unremarkable. MRI THORACIC SPINE FINDINGS Alignment: Examination markedly limited as the patient was unable to tolerate the full length of the exam. Sagittal T1, T2, and STIR sequences only were performed. No axial images obtained. Vertebral bodies normally aligned  with preservation of the normal thoracic kyphosis. Vertebrae: Vertebral body height maintained without evidence for interval fracture. Butterfly vertebra noted at T12. Diffusely decreased T1 weighted signal intensity throughout the visualized bone marrow, like related to anemia chronic disease. No findings to suggest interval or new discitis or septic arthritis. Cord: Signal intensity within the thoracic spinal cord grossly within normal limits on these limited sagittal views. No definite cord signal abnormality or cord edema to suggest acute spinal cord infarction. Previously seen diffuse epidural collection appears overall decreased in size from previous, with improved spinal stenosis and thecal sac patency. Paraspinal and other soft tissues: Scattered edema seen within the posterior paraspinous soft tissues at the upper back/cervicothoracic junction. No discrete soft tissue collections. Disc  levels: T5-6: Central disc protrusion indenting upon the ventral thoracic spinal cord, stable. IMPRESSION: 1. Technically limited exam due to motion artifact and the patient's inability to tolerate the full length of the exam. Cervical and thoracic spine only was imaged, and only sagittal sequences of the thoracic spine were obtained. 2. No imaging findings to suggest spinal cord infarction identified on this limited exam. 3. Interval improvement in diffuse epidural collection, decreased in size as compared to previous exam with improved diffuse spinal stenosis and thecal sac patency. No new discitis or facet arthritis. Electronically Signed   By: Rise Mu M.D.   On: 03/30/2018 22:52   Dg Chest Port 1 View  Result Date: 03/30/2018 CLINICAL DATA:  Shortness of breath EXAM: PORTABLE CHEST 1 VIEW COMPARISON:  03/30/2018 at 0227 hours FINDINGS: Moderate layering left pleural effusion, increased. Left lower lobe opacity, atelectasis versus pneumonia. Right lung is clear.  No pneumothorax. The heart is normal in size. IMPRESSION: Moderate layering left pleural effusion, increased. Left lower lobe opacity, atelectasis versus pneumonia. Electronically Signed   By: Charline Bills M.D.   On: 03/30/2018 19:06   Dg Chest Port 1 View  Result Date: 03/30/2018 CLINICAL DATA:  Chest pain EXAM: PORTABLE CHEST 1 VIEW COMPARISON:  None. FINDINGS: Retrocardiac opacity, atelectasis versus pneumonia. Possible small left pleural effusion. Right lung is clear. No pneumothorax. The heart is normal in size. IMPRESSION: Retrocardiac opacity, atelectasis versus pneumonia. Possible small left pleural effusion. Electronically Signed   By: Charline Bills M.D.   On: 03/30/2018 02:49     Assessment/Plan: New L leg weakness C2- Sacrum epidural abscess MSSA bacteremia DM2 Prev IVDA  Total days of antibiotics: 11 ancef --> merrem/vanco (day 1)  MRI (incomplete due to motion) shows interval improvement and no  spinal infarct.  CTA no PE He continues to be hypotensive. Would change his PIC when possible.  Await his repeat BCx, UCx.  Moving legs without difficulty Back to floor today if continues to do well.  If continues to improve, will change to nafcillin tomorrow         Johny Sax MD, FACP Infectious Diseases (pager) [phone] [email or web address] 03/31/2018, 9:28 AM  LOS: 11 days

## 2018-03-31 NOTE — Progress Notes (Signed)
Inpatient Diabetes Program Recommendations  AACE/ADA: New Consensus Statement on Inpatient Glycemic Control (2015)  Target Ranges:  Prepandial:   less than 140 mg/dL      Peak postprandial:   less than 180 mg/dL (1-2 hours)      Critically ill patients:  140 - 180 mg/dL   Lab Results  Component Value Date   GLUCAP 349 (H) 03/31/2018   HGBA1C 12.6 (A) 02/26/2018    Review of Glycemic ControlResults for SHIRO, ELLERMAN (MRN [id number]) as of 03/31/2018 12:49  Ref. Range 03/30/2018 11:52 03/30/2018 18:33 03/30/2018 23:09 03/31/2018 07:30 03/31/2018 12:28  Glucose-Capillary Latest Ref Range: 70 - 99 mg/dL 528 (H) 413 (H) 244 (H) 265 (H) 349 (H)    Diabetes history: DM Outpatient Diabetes medications:  Lantus 50 units q HS, Humalog 10 units tid with meals,  Metformin 500 mg bid Current orders for Inpatient glycemic control:  Novolog sensitive tid with meals and HS, Lantus 25 units q HS Solu-cortef 100 mg IV q 8 hours Inpatient Diabetes Program Recommendations:   Note that blood sugars increased with steroids. May consider increasing Lantus to 30 units q HS and increase Novolog correction to q 4 hours (while on steroids).  As steroids are titrated down, will also need insulin reduced.  Thanks,  Beryl Meager, RN, BC-ADM Inpatient Diabetes Coordinator Pager [phone] (8a-5p)

## 2018-03-31 NOTE — Progress Notes (Signed)
PROGRESS NOTE        PATIENT DETAILS  Name: Tony Long  Age: 42 y.o.  Sex: male Date of Birth: [birth year] Admit Date: 03/20/2018 Admitting Physician Kendell Bane, MD  ZOX:[id number], Rolm Gala, FNP   Brief Narrative: [name] a 42 y.o. male history of IVDA, DM-2, hypertension admitted for worsening back pain-further evaluation revealed MSSA bacteremia with tricuspid valve endocarditis, epidural abscess involving C2 through sacrum, and numerous soft tissue back abscesses.  Evaluated by general surgery-underwent I&D of her lower back soft tissue abscess, evaluated by neurosurgery-not felt to be a candidate for decompressive surgery due to extensive nature of the disease and lack of any significant neurological findings.  ID following with plans to continue Ancef with stop date of 05/25/2018.  See below for further details  Subjective: Patient in bed, appears comfortable, denies any headache, no fever, no chest pain or pressure, no shortness of breath , no abdominal pain.  Has some left lower extremity weakness but it has improved compared to 03/30/2018.  Assessment/Plan:   MSSA bacteremia with tricuspid valve endocarditis, extensive epidural abscess (C2 through sacrum) and large soft tissue lower back abscess: due to extensive nature of the epidural abscess and lack of concerning neurological findings-neurosurgery does not recommend decompressive surgery.  In by ID and current recommendation is total of 6 weeks of antibiotics with a tentative stop date of 05/25/2018.  Given history of IVDA-not a candidate for outpatient IV antimicrobial therapy. He has received a PICC line, continue IV antibiotics through PEG.   If patient cannot be placed to SNF-he will likely remain inpatient.  Patient aware of the extensive nature of this infection-with life-threatening and life disabling risks including quadriplegia along with life-threatening sepsis with this present infection  which is nonsurgical.  This was explained by me to the patient several times along with his parents bedside on 03/22/2018 and 03/23/2018.  Patient was doing well on IV cefazolin since admission till about 03/30/2018 when he had another abrupt run off sepsis with temp of 103, hypotension to 60s requiring several liters of IV fluids, ID and neurosurgery and PCCM were called and his antibiotics were broadened from cefazolin to IV vancomycin and meropenem on 03/30/2018.  On 03/31/2018 his sepsis physiology has resolved and he is much better although he has now developed a left lower extremity weakness.  Neurosurgery is following.  He has undergone repeat echocardiogram, CT angios chest which were unremarkable, MRI see, T and L-spine are being repeated.  Neurosurgery following.  Prognosis remains poor and again family and patient were updated by me in detail on 03/30/2018.  Remains nonsurgical now DNR.    New onset of left-sided pleuritic chest pain starting on late night 03/29/2018.  Does have lateral T wave inversions and ST depression on EKG, repeat EKG improved, troponin stable he is currently on aspirin, low-dose beta-blocker and statin as tolerated, repeat echocardiogram unremarkable.  Chest pain-free now.  CT chest noted with small pleural effusion which is nonspecific will continue to monitor as symptoms are better.  Hypertension: On beta-blocker currently on hold as blood pressure is on the softer side.  We will continue to monitor.  Gentle hydration.  Constipation: Had bowel movement overnight-probably secondary to narcotic use-have placed on scheduled MiraLAX and senna.  Acute urinary retention: Required Foley catheter placement on 03/26/2018 along with Flomax, continue for now.  Will try removing Foley catheter on 04/02/2018 and monitor.  Cocaine/Subutex abuse: Withdrawal symptoms much better-on 9/25 patient developed excessive sedation-Klonopin/Flexeril has been changed to as needed dosing-clonidine  being tapered down.  On 03/28/2018 he requested me that he be put back on his Suboxone, he does not want short-acting pain medications anymore, changes will be made per his request.    Polysubstance abuse/IVDA: Counseled to quit.  Hypokalemia and hypomagnesemia.  Both replaced.  Monitor.  DM-2: Poor outpatient control, continue Lantus and sliding scale.     CBG (last 3)  Recent Labs    03/30/18 1833 03/30/18 2309 03/31/18 0730  GLUCAP 154* 301* 265*   Lab Results  Component Value Date   HGBA1C 12.6 (A) 02/26/2018     DVT Prophylaxis: Prophylactic heparin  Code Status: Full code  Family Communication: Parents multiple times clearly explained prognosis remains poor, now DNR.  They have clear understanding of his medical situation.  Disposition Plan: Briefly moved to ICU on 03/30/2018, now stabilized moved back to stepdown on 03/31/2018 and monitor  Antimicrobial agents: Anti-infectives (From admission, onward)   Start     Dose/Rate Route Frequency Ordered Stop   03/31/18 0200  vancomycin (VANCOCIN) IVPB 750 mg/150 ml premix     750 mg 150 mL/hr over 60 Minutes Intravenous Every 8 hours 03/30/18 1628     03/31/18 0100  meropenem (MERREM) 1 g in sodium chloride 0.9 % 100 mL IVPB     1 g 200 mL/hr over 30 Minutes Intravenous Every 8 hours 03/30/18 1628     03/30/18 1630  vancomycin (VANCOCIN) 1,500 mg in sodium chloride 0.9 % 500 mL IVPB     1,500 mg 250 mL/hr over 120 Minutes Intravenous NOW 03/30/18 1619 03/30/18 1925   03/30/18 1630  meropenem (MERREM) 2 g in sodium chloride 0.9 % 100 mL IVPB     2 g 200 mL/hr over 30 Minutes Intravenous NOW 03/30/18 1619 03/30/18 1754   03/21/18 0930  ceFAZolin (ANCEF) IVPB 2g/100 mL premix  Status:  Discontinued     2 g 200 mL/hr over 30 Minutes Intravenous Every 8 hours 03/21/18 0920 03/30/18 1616      Procedures:   MRI C, T and L-spine done 03/30/2018 and 03/31/2018.    CT angiogram chest 03/30/2018.  1. No pulmonary embolus.  2. Moderate loculated left pleural effusion. Adjacent airspace disease likely compressive atelectasis, pneumonia not excluded in the setting of spinal infection and endocarditis. Mild right pleural thickening or loculated pleural fluid in the upper right hemithorax.  Repeat echocardiogram 03/30/2018 - Left ventricle: The cavity size was normal. Wall thickness was normal. Systolic function was normal. The estimated ejection fraction was in the range of 60% to 65%. Wall motion was normal; there were no regional wall motion abnormalities. Left ventricular diastolic function parameters were normal.  TEE - - Left ventricle: The cavity size was normal. Wall thickness wasnormal. Systolic function was normal. The estimated ejectionfraction was in the range of 60% to 65%. - Aortic valve: No evidence of vegetation. - Mitral valve: No evidence of vegetation. - Left atrium: No evidence of thrombus in the atrial cavity or appendage. No evidence of thrombus in the appendage. - Right atrium: No evidence of thrombus in the atrial cavity orappendage. - Tricuspid valve: There was a vegetation. There was a small (1.2cmx .6cm), mobile vegetation on the septal leaflet. There was mildregurgitation. - Pulmonic valve: No evidence of vegetation.  Impressions:   Tricuspid valve endocarditis. Discussed with primary team  MRI C-T-L Spine - 1. Epidural abscess from C2 to sacrum as described. Maximal cord compression from T3-4 to T5-6. The thecal sac is completely effaced from L1 to L4-5. At the L3-4 interspinous space the spinal abscess communicates with large bilateral abscesses within the intrinsic back muscles. There is osteomyelitis of the L2, L3, and L4 spinous processes. No discitis or facet arthritis. 2. Left paravertebral abscess along the lower thoracic spine with small left empyema that is new from CT 2 days ago. 3. Distended bladder  CT - 1. Evidence of cellulitis involving the right side of the base of  the neck extending into the right supraclavicular region with fluid and gas in the soft tissues at the base of the right side of the neck. 2. No significant abnormality of the thoracic spine. 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  CT -  1. Extensive abnormal fluid collection in the subcutaneous fat of the midline of the back with underlying marked abnormality of the posterior paraspinal musculature from L1-2 through S3. This is worrisome for subcutaneous abscess and myositis. 2. Moth-eaten appearance of the spinous processes of L3 and L4 consistent with osteomyelitis. 3. No discrete epidural abscess. However, the abnormal edema in the paraspinal musculature extends to the posterior aspect of the spinal canal at L3-4. 4. MRI with and without contrast may better define the extent of the soft tissue and infection and could detect epidural extension that is not apparent on this unenhanced CT scan.  9/22>> PICC line  I&D by [facility] of back soft tissue abscess on -  03/22/18    CONSULTS: N.Surgery, ID, general surgery, PCCM  Time spent: 25- minutes-Greater than 50% of this time was spent in counseling, explanation of diagnosis, planning of further management, and coordination of care.  MEDICATIONS: Scheduled Meds: . aspirin  81 mg Oral Daily  . atorvastatin  40 mg Oral q1800  . gabapentin  300 mg Oral TID  . heparin injection (subcutaneous)  5,000 Units Subcutaneous Q8H  . hydrocortisone sod succinate (SOLU-CORTEF) inj  100 mg Intravenous Q8H  . insulin aspart  0-5 Units Subcutaneous QHS  . insulin aspart  0-9 Units Subcutaneous TID WC  . insulin glargine  25 Units Subcutaneous QHS  . iopamidol      . mouth rinse  15 mL Mouth Rinse BID  . nicotine  21 mg Transdermal Daily  . polyethylene glycol  17 g Oral BID  . senna-docusate  2 tablet Oral QHS  . sodium chloride flush  10-40 mL Intracatheter Q12H  . tamsulosin  0.4 mg Oral Daily   Continuous Infusions: . sodium chloride    . meropenem  (MERREM) IV Stopped (03/31/18 0039)  . vancomycin Stopped (03/31/18 0238)   PRN Meds:.acetaminophen, bisacodyl, clonazepam, cyclobenzaprine, hydrALAZINE, LORazepam, magnesium hydroxide, metoprolol tartrate, nitroGLYCERIN, sodium phosphate   PHYSICAL EXAM: Vital signs: Vitals:   03/31/18 0645 03/31/18 0700 03/31/18 0732 03/31/18 0800  BP: (!) 89/53 (!) 92/57  105/66  Pulse: 79 82  (!) 104  Resp: (!) 8 (!) 9  15  Temp:   99 F (37.2 C)   TempSrc:   Oral   SpO2: 97% 95%  90%  Weight:      Height:       Filed Weights   03/21/18 0602  Weight: 68 kg   Body mass index is 20.92 kg/m.   Awake Alert, Oriented X 3, Normal affect Roe.AT,PERRAL Supple Neck,No JVD, No cervical lymphadenopathy appriciated.  Symmetrical Chest wall movement, Good air movement bilaterally, CTAB  RRR,No Gallops, Rubs or new Murmurs, No Parasternal Heave +ve B.Sounds, Abd Soft, No tenderness, No organomegaly appriciated, No rebound - guarding or rigidity. No Cyanosis, Clubbing or edema, No new Rash or bruise Foley catheter in place, L Leg 3/5  I have personally reviewed following labs and imaging studies  LABORATORY DATA: CBC: Recent Labs  Lab 03/26/18 0640 03/30/18 0227 03/30/18 1837 03/31/18 0336  WBC 15.4* 14.5* 25.2* 24.8*  HGB 10.2* 9.2* 7.6* 7.5*  HCT 31.4* 28.6* 24.3* 23.7*  MCV 95.2 95.7 98.4 97.1  PLT 289 382 365 358    Basic Metabolic Panel: Recent Labs  Lab 03/26/18 0640 03/30/18 0227  NA 138 135  K 3.5 2.8*  CL 103 90*  CO2 22 34*  GLUCOSE 319* 230*  BUN 15 6  CREATININE 1.08 0.68  CALCIUM 8.9 8.5*  MG  --  1.6*    GFR: Estimated Creatinine Clearance: 116.9 mL/min (by C-G formula based on SCr of 0.68 mg/dL).  Liver Function Tests: No results for input(s): AST, ALT, ALKPHOS, BILITOT, PROT, ALBUMIN in the last 168 hours. No results for input(s): LIPASE, AMYLASE in the last 168 hours. No results for input(s): AMMONIA in the last 168 hours.  Coagulation Profile: No  results for input(s): INR, PROTIME in the last 168 hours.  Cardiac Enzymes: Recent Labs  Lab 03/30/18 0219 03/30/18 1222 03/30/18 1620  TROPONINI <0.03 <0.03 <0.03    BNP (last 3 results) No results for input(s): PROBNP in the last 8760 hours.  HbA1C: No results for input(s): HGBA1C in the last 72 hours.  CBG: Recent Labs  Lab 03/30/18 0746 03/30/18 1152 03/30/18 1833 03/30/18 2309 03/31/18 0730  GLUCAP 134* 176* 154* 301* 265*    Lipid Profile: Recent Labs    03/31/18 0336  CHOL 74  HDL 23*  LDLCALC 41  TRIG 48  CHOLHDL 3.2    Thyroid Function Tests: No results for input(s): TSH, T4TOTAL, FREET4, T3FREE, THYROIDAB in the last 72 hours.  Anemia Panel: No results for input(s): VITAMINB12, FOLATE, FERRITIN, TIBC, IRON, RETICCTPCT in the last 72 hours.  Urine analysis:    Component Value Date/Time   COLORURINE YELLOW 03/30/2018 1930   APPEARANCEUR CLEAR 03/30/2018 1930   LABSPEC 1.009 03/30/2018 1930   PHURINE 6.0 03/30/2018 1930   GLUCOSEU 50 (A) 03/30/2018 1930   HGBUR SMALL (A) 03/30/2018 1930   BILIRUBINUR NEGATIVE 03/30/2018 1930   BILIRUBINUR small 02/26/2018 1202   KETONESUR NEGATIVE 03/30/2018 1930   PROTEINUR NEGATIVE 03/30/2018 1930   UROBILINOGEN 1.0 02/26/2018 1202   UROBILINOGEN 1.0 12/11/2016 1020   NITRITE NEGATIVE 03/30/2018 1930   LEUKOCYTESUR TRACE (A) 03/30/2018 1930    Sepsis Labs: Lactic Acid, Venous    Component Value Date/Time   LATICACIDVEN 1.2 03/30/2018 1837    MICROBIOLOGY: Recent Results (from the past 240 hour(s))  Anaerobic culture     Status: None   Collection Time: 03/22/18 10:36 AM  Result Value Ref Range Status   Specimen Description ABSCESS BACK  Final   Special Requests NONE  Final   Gram Stain   Final    RARE WBC PRESENT, PREDOMINANTLY MONONUCLEAR ABUNDANT GRAM POSITIVE COCCI    Culture   Final    NO ANAEROBES ISOLATED Performed at [facility], [address], [place], [place] [address]     Report Status 03/27/2018 FINAL  Final  Aerobic Culture (superficial specimen)     Status: None   Collection Time: 03/22/18 10:36 AM  Result Value Ref Range Status  Specimen Description ABSCESS BACK  Final   Special Requests NONE  Final   Gram Stain   Final    FEW WBC PRESENT,BOTH PMN AND MONONUCLEAR MODERATE GRAM POSITIVE COCCI Performed at [facility], [address], [place], [place] [address]    Culture ABUNDANT STAPHYLOCOCCUS AUREUS  Final   Report Status 03/24/2018 FINAL  Final   Organism ID, Bacteria STAPHYLOCOCCUS AUREUS  Final      Susceptibility   Staphylococcus aureus - MIC*    CIPROFLOXACIN <=0.5 SENSITIVE Sensitive     ERYTHROMYCIN <=0.25 SENSITIVE Sensitive     GENTAMICIN <=0.5 SENSITIVE Sensitive     OXACILLIN 0.5 SENSITIVE Sensitive     TETRACYCLINE <=1 SENSITIVE Sensitive     VANCOMYCIN 1 SENSITIVE Sensitive     TRIMETH/SULFA <=10 SENSITIVE Sensitive     CLINDAMYCIN <=0.25 SENSITIVE Sensitive     RIFAMPIN <=0.5 SENSITIVE Sensitive     Inducible Clindamycin NEGATIVE Sensitive     * ABUNDANT STAPHYLOCOCCUS AUREUS  Culture, blood (Routine X 2) w Reflex to ID Panel     Status: None   Collection Time: 03/23/18  4:39 AM  Result Value Ref Range Status   Specimen Description BLOOD RIGHT HAND  Final   Special Requests   Final    BOTTLES DRAWN AEROBIC ONLY Blood Culture adequate volume   Culture   Final    NO GROWTH 5 DAYS Performed at [name], [address] [place], [place] [address]    Report Status 03/28/2018 FINAL  Final  Culture, blood (single)     Status: None (Preliminary result)   Collection Time: 03/30/18  4:12 PM  Result Value Ref Range Status   Specimen Description BLOOD RIGHT ANTECUBITAL  Final   Special Requests   Final    BOTTLES DRAWN AEROBIC ONLY Blood Culture adequate volume   Culture   Final    NO GROWTH < 24 HOURS Performed at [facility], [address] [place], [place] [address]    Report Status PENDING   Incomplete    RADIOLOGY STUDIES/RESULTS: Ct Angio Chest Pe W Or Wo Contrast  Result Date: 03/30/2018 CLINICAL DATA:  Shortness of breath PE suspected, high pretest prob. Patient with known epidural abscess and endocarditis. Now with left-sided chest pain. EXAM: CT ANGIOGRAPHY CHEST WITH CONTRAST TECHNIQUE: Multidetector CT imaging of the chest was performed using the standard protocol during bolus administration of intravenous contrast. Multiplanar CT image reconstructions and MIPs were obtained to evaluate the vascular anatomy. CONTRAST:  54mL ISOVUE-370 IOPAMIDOL (ISOVUE-370) INJECTION 76% COMPARISON:  Chest radiographs earlier this day. FINDINGS: Cardiovascular: There are no filling defects within the pulmonary arteries to suggest pulmonary embolus. The thoracic aorta is normal in caliber without dissection. Mild cardiomegaly. Small amount pericardial fluid. Mediastinum/Nodes: Loculated left pleural effusion tracks along the left aspect of the mediastinum. Small left hilar nodes without bulky adenopathy. No enlarged mediastinal nodes. No dominant thyroid nodule. The esophagus is decompressed. Lungs/Pleura: Moderate left pleural effusion is loculated and tracks along the posterior, lateral, and medial hemithorax. Adjacent airspace disease in the left lower lobe, favoring compressive atelectasis, with adjacent compressive atelectasis in the right upper lobe. Pleural thickening versus loculated pleural effusion the upper medial right hemithorax, with additional areas of pleural thickening dependently. Dependent atelectasis in the right lower lobe. Trachea and bronchi are patent. Upper Abdomen: No acute findings. Musculoskeletal: Patient with known epidural abscess, not well delineated by CT. No bony destructive change. Review of the MIP images confirms  the above findings. IMPRESSION: 1. No pulmonary embolus. 2. Moderate loculated left pleural effusion. Adjacent airspace disease likely compressive atelectasis,  pneumonia not excluded in the setting of spinal infection and endocarditis. Mild right pleural thickening or loculated pleural fluid in the upper right hemithorax. Electronically Signed   By: Narda Rutherford M.D.   On: 03/30/2018 23:21   Ct Thoracic Spine Wo Contrast  Result Date: 03/20/2018 CLINICAL DATA:  Progressive back pain.  Swelling of the lower back. EXAM: CT THORACIC SPINE WITHOUT CONTRAST TECHNIQUE: Multidetector CT images of the thoracic were obtained using the standard protocol without intravenous contrast. COMPARISON:  None. FINDINGS: Alignment: Normal. 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 Paraspinal and other soft tissues: There is abnormal gas and fluid in the soft tissues of the right side of the base of the neck and in the right supraclavicular region. Paraspinal soft tissues appear normal throughout the thoracic spine. Disc levels: There is no evidence of disc protrusion or significant disc bulging or spinal or foraminal stenosis or other significant abnormality of the thoracic spine. IMPRESSION: 1. Evidence of cellulitis involving the right side of the base of the neck extending into the right supraclavicular region with fluid and gas in the soft tissues at the base of the right side of the neck. 2. No significant abnormality of the thoracic spine. 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 Electronically Signed   By: Francene Boyers M.D.   On: 03/20/2018 11:25   Ct Lumbar Spine Wo Contrast  Addendum Date: 03/20/2018   ADDENDUM REPORT: 03/20/2018 11:44 ADDENDUM: Critical Value/emergent results were called by telephone at the time of interpretation on 03/20/2018 at 11:30 am to Dr. Sharyn Creamer , who verbally acknowledged these results. Electronically Signed   By: Francene Boyers M.D.   On: 03/20/2018 11:44   Result Date: 03/20/2018 CLINICAL DATA:  Increasing low back pain and soft tissue swelling. EXAM: CT LUMBAR SPINE WITHOUT CONTRAST TECHNIQUE: Multidetector CT imaging of the lumbar  spine was performed without intravenous contrast administration. Multiplanar CT image reconstructions were also generated. IV contrast could not be utilized due to the lack of an appropriate IV. COMPARISON:  None. FINDINGS: Segmentation: 5 lumbar type vertebrae. Alignment: Normal. Vertebrae: There is a moth-eaten appearance of the spinous processes of L3 and L4 which is worrisome for osteomyelitis. Bilateral pars defects at L5 with grade 1 spondylolisthesis. 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 Paraspinal and other soft tissues: There is an extensive abnormal fluid collection in the subcutaneous soft tissues of the posterior aspect of the back extending from approximately L1-2 to S3. This fluid collection is lobulated and measures approximately 20 x 9 x 2.5 cm. It is centered slightly to the left of midline and has a mass effect upon the adjacent posterior paraspinal muscles. There is abnormal lucency in the underlying paraspinal muscles which could represent myositis. Disc levels: T11-12: No significant abnormality. Butterfly T12 vertebra. T12-L1: No significant abnormality. L1-2: Normal disc. Abnormal edema in the posterior paraspinal musculature with adjacent fluid collection in the subcutaneous fat of the posterior aspect of the back as described above. L2-3: Normal disc. L3-4: Normal disc. Lucency in the posterior paraspinal soft tissues extends to the posterior aspect of the thecal sac on image 80 of series 4 but there is no discrete epidural abscess. L4-5: Normal disc.  No evidence of epidural abscess. L5-S1: Grade 1 spondylolisthesis. No disc bulging or protrusion. Bilateral pars defects. No visible epidural abscess. IMPRESSION: 1. Extensive abnormal fluid collection in the subcutaneous fat of the midline  of the back with underlying marked abnormality of the posterior paraspinal musculature from L1-2 through S3. This is worrisome for subcutaneous abscess and myositis. 2. Moth-eaten appearance of the spinous  processes of L3 and L4 consistent with osteomyelitis. 3. No discrete epidural abscess. However, the abnormal edema in the paraspinal musculature extends to the posterior aspect of the spinal canal at L3-4. 4. MRI with and without contrast may better define the extent of the soft tissue and infection and could detect epidural extension that is not apparent on this unenhanced CT scan. Electronically Signed: By: Francene Boyers M.D. On: 03/20/2018 11:18   Mr Cervical Spine Wo Contrast  Result Date: 03/30/2018 CLINICAL DATA:  42 year old male with history of MSSA bacteremia with known endocarditis and epidural abscess extending from C2 through the sacrum. Recent episode of hypotension and acute lower extremity weakness. Evaluate abscess and possible spinal cord infarct. EXAM: MRI CERVICAL, THORACIC SPINE WITHOUT CONTRAST TECHNIQUE: Multiplanar and multiecho pulse sequences of the cervical spine, to include the craniocervical junction and cervicothoracic junction, and thoracic and lumbar spine, were obtained without intravenous contrast. COMPARISON:  Prior MRI from 03/22/2018. FINDINGS: MRI CERVICAL SPINE FINDINGS Alignment: Examination technically limited as the patient was unable to tolerate the full length of the exam. Additionally, images provided are degraded by motion artifact. Reversal of the normal cervical lordosis with apex at C5, stable. No interval listhesis or malalignment. Vertebrae: Vertebral body height maintained without acute or interval fracture. Bone marrow signal intensity diffusely decreased on T1 weighted imaging, suspected to be related to anemia and chronic disease. No discrete osseous lesions. No abnormal marrow edema. No evidence for interval discitis. Cord: Signal intensity within the cervical spinal cord is within normal limits. No findings to suggest interval cord infarction on this motion degraded and limited exam. Previously identified epidural collection involving the ventral epidural  space extending from C2 inferiorly is decreased in size, now measuring up to 7 mm in maximal AP diameter at the level of C2. Collection diffusely involves the ventral epidural space, but also is seen dorsally as well. Slightly improved diffuse spinal stenosis with thecal sac patency. Posterior Fossa, vertebral arteries, paraspinal tissues: Visualized brain and posterior fossa within normal limits. Craniocervical junction normal. Scattered edema within the posterior paraspinous soft tissues. Normal intravascular flow voids seen within the vertebral arteries bilaterally. Disc levels: C2-C3: Unremarkable. C3-C4: Small left foraminal protrusion with associated moderate left C4 foraminal stenosis. C4-C5:  Unremarkable. C5-C6: Left eccentric disc bulge with uncovertebral hypertrophy. Moderate left C6 foraminal narrowing. C6-C7: Right foraminal disc protrusion with associated moderate right C7 foraminal stenosis. C7-T1:  Unremarkable. MRI THORACIC SPINE FINDINGS Alignment: Examination markedly limited as the patient was unable to tolerate the full length of the exam. Sagittal T1, T2, and STIR sequences only were performed. No axial images obtained. Vertebral bodies normally aligned with preservation of the normal thoracic kyphosis. Vertebrae: Vertebral body height maintained without evidence for interval fracture. Butterfly vertebra noted at T12. Diffusely decreased T1 weighted signal intensity throughout the visualized bone marrow, like related to anemia chronic disease. No findings to suggest interval or new discitis or septic arthritis. Cord: Signal intensity within the thoracic spinal cord grossly within normal limits on these limited sagittal views. No definite cord signal abnormality or cord edema to suggest acute spinal cord infarction. Previously seen diffuse epidural collection appears overall decreased in size from previous, with improved spinal stenosis and thecal sac patency. Paraspinal and other soft tissues:  Scattered edema seen within the posterior paraspinous soft tissues  at the upper back/cervicothoracic junction. No discrete soft tissue collections. Disc levels: T5-6: Central disc protrusion indenting upon the ventral thoracic spinal cord, stable. IMPRESSION: 1. Technically limited exam due to motion artifact and the patient's inability to tolerate the full length of the exam. Cervical and thoracic spine only was imaged, and only sagittal sequences of the thoracic spine were obtained. 2. No imaging findings to suggest spinal cord infarction identified on this limited exam. 3. Interval improvement in diffuse epidural collection, decreased in size as compared to previous exam with improved diffuse spinal stenosis and thecal sac patency. No new discitis or facet arthritis. Electronically Signed   By: Rise Mu M.D.   On: 03/30/2018 22:52   Mr Thoracic Spine Wo Contrast  Result Date: 03/30/2018 CLINICAL DATA:  42 year old male with history of MSSA bacteremia with known endocarditis and epidural abscess extending from C2 through the sacrum. Recent episode of hypotension and acute lower extremity weakness. Evaluate abscess and possible spinal cord infarct. EXAM: MRI CERVICAL, THORACIC SPINE WITHOUT CONTRAST TECHNIQUE: Multiplanar and multiecho pulse sequences of the cervical spine, to include the craniocervical junction and cervicothoracic junction, and thoracic and lumbar spine, were obtained without intravenous contrast. COMPARISON:  Prior MRI from 03/22/2018. FINDINGS: MRI CERVICAL SPINE FINDINGS Alignment: Examination technically limited as the patient was unable to tolerate the full length of the exam. Additionally, images provided are degraded by motion artifact. Reversal of the normal cervical lordosis with apex at C5, stable. No interval listhesis or malalignment. Vertebrae: Vertebral body height maintained without acute or interval fracture. Bone marrow signal intensity diffusely decreased on T1  weighted imaging, suspected to be related to anemia and chronic disease. No discrete osseous lesions. No abnormal marrow edema. No evidence for interval discitis. Cord: Signal intensity within the cervical spinal cord is within normal limits. No findings to suggest interval cord infarction on this motion degraded and limited exam. Previously identified epidural collection involving the ventral epidural space extending from C2 inferiorly is decreased in size, now measuring up to 7 mm in maximal AP diameter at the level of C2. Collection diffusely involves the ventral epidural space, but also is seen dorsally as well. Slightly improved diffuse spinal stenosis with thecal sac patency. Posterior Fossa, vertebral arteries, paraspinal tissues: Visualized brain and posterior fossa within normal limits. Craniocervical junction normal. Scattered edema within the posterior paraspinous soft tissues. Normal intravascular flow voids seen within the vertebral arteries bilaterally. Disc levels: C2-C3: Unremarkable. C3-C4: Small left foraminal protrusion with associated moderate left C4 foraminal stenosis. C4-C5:  Unremarkable. C5-C6: Left eccentric disc bulge with uncovertebral hypertrophy. Moderate left C6 foraminal narrowing. C6-C7: Right foraminal disc protrusion with associated moderate right C7 foraminal stenosis. C7-T1:  Unremarkable. MRI THORACIC SPINE FINDINGS Alignment: Examination markedly limited as the patient was unable to tolerate the full length of the exam. Sagittal T1, T2, and STIR sequences only were performed. No axial images obtained. Vertebral bodies normally aligned with preservation of the normal thoracic kyphosis. Vertebrae: Vertebral body height maintained without evidence for interval fracture. Butterfly vertebra noted at T12. Diffusely decreased T1 weighted signal intensity throughout the visualized bone marrow, like related to anemia chronic disease. No findings to suggest interval or new discitis or  septic arthritis. Cord: Signal intensity within the thoracic spinal cord grossly within normal limits on these limited sagittal views. No definite cord signal abnormality or cord edema to suggest acute spinal cord infarction. Previously seen diffuse epidural collection appears overall decreased in size from previous, with improved spinal stenosis and thecal  sac patency. Paraspinal and other soft tissues: Scattered edema seen within the posterior paraspinous soft tissues at the upper back/cervicothoracic junction. No discrete soft tissue collections. Disc levels: T5-6: Central disc protrusion indenting upon the ventral thoracic spinal cord, stable. IMPRESSION: 1. Technically limited exam due to motion artifact and the patient's inability to tolerate the full length of the exam. Cervical and thoracic spine only was imaged, and only sagittal sequences of the thoracic spine were obtained. 2. No imaging findings to suggest spinal cord infarction identified on this limited exam. 3. Interval improvement in diffuse epidural collection, decreased in size as compared to previous exam with improved diffuse spinal stenosis and thecal sac patency. No new discitis or facet arthritis. Electronically Signed   By: Rise Mu M.D.   On: 03/30/2018 22:52   Mr Cervical Spine W Wo Contrast  Result Date: 03/22/2018 CLINICAL DATA:  Spine infection. EXAM: MRI TOTAL SPINE WITHOUT AND WITH CONTRAST TECHNIQUE: Multisequence MR imaging of the spine from the cervical spine to the sacrum was performed prior to and following IV contrast administration. CONTRAST:  6 cc Gadavist intravenous COMPARISON:  CT of the thoracic and lumbar spine from 2 days ago FINDINGS: MRI CERVICAL SPINE FINDINGS Alignment: Normal Vertebrae: No evidence of osseous infection. Canal/Cord: There is extensive spinal fluid collection preferentially in the ventral but also in the right more than left dorsal canal. Subarachnoid space is diffusely effaced. Maximal  thickness is posterior to C2 at 9 mm. No cord signal abnormality. Posterior Fossa, vertebral arteries, paraspinal tissues: No retropharyngeal or other discrete soft tissue collection. Disc levels: C2-3: Unremarkable. C3-4: Small left foraminal protrusion with moderate narrowing C4-5: Unremarkable. C5-6: Disc narrowing and bulging with asymmetric left uncovertebral spurring. Left foraminal impingement C6-7: Right foraminal protrusion mild narrowing. C7-T1:Unremarkable. MRI THORACIC SPINE FINDINGS Alignment:  Normal Vertebrae: No evidence of osteomyelitis or discitis. T12 butterfly vertebra. Canal/Cord: Cervical ventral epidural collection continues throughout the thoracic levels. There is also a focal dorsal component at T3-4 to T5-6, where thecal sac effacement is accentuated and there is cord flattening. No cord edema. Paraspinal and other soft tissues: Paraspinous phlegmon on the left at T8-T12, with new complex left pleural effusion and lower lobe atelectasis Disc levels: T5-6 central disc protrusion. MRI LUMBAR SPINE FINDINGS Segmentation:  5 lumbar type vertebral bodies Alignment:  Grade 1 anterolisthesis at L5-S1. Vertebrae: Marrow edema and heterogeneous enhancement within the L2, L3, and L4 spinous processes. No discitis or facet edema. Conus medullaris: Extends to the L1 level and is non edematous. There is extensive epidural collection completely effacing the thecal sac throughout the lumbar spine until L4-5 and below where the collection becomes ventral and right eccentric. The infection communicates with extensive bilateral abscess within the intrinsic back muscles via the interspinous space at L3-4. Patient had recent subcutaneous collection and left buttocks collection drainage, with packing seen in place. This midline, upper subcutaneous collection communicates with the paravertebral abscess along its superior margin based on postcontrast axial images. Paraspinal and other soft tissues: As above.   Distended bladder Disc levels: Chronic bilateral pars defects at L5. Critical Value/emergent results were called by telephone at the time of interpretation on 03/22/2018 at 3:04 pm to Dr. Thedore Mins , who verbally acknowledged these results. IMPRESSION: 1. Epidural abscess from C2 to sacrum as described. Maximal cord compression from T3-4 to T5-6. The thecal sac is completely effaced from L1 to L4-5. At the L3-4 interspinous space the spinal abscess communicates with large bilateral abscesses within the intrinsic back muscles.  There is osteomyelitis of the L2, L3, and L4 spinous processes. No discitis or facet arthritis. 2. Left paravertebral abscess along the lower thoracic spine with small left empyema that is new from CT 2 days ago. 3. Distended bladder Electronically Signed   By: Marnee Spring M.D.   On: 03/22/2018 15:11   Mr Thoracic Spine W Wo Contrast  Result Date: 03/22/2018 CLINICAL DATA:  Spine infection. EXAM: MRI TOTAL SPINE WITHOUT AND WITH CONTRAST TECHNIQUE: Multisequence MR imaging of the spine from the cervical spine to the sacrum was performed prior to and following IV contrast administration. CONTRAST:  6 cc Gadavist intravenous COMPARISON:  CT of the thoracic and lumbar spine from 2 days ago FINDINGS: MRI CERVICAL SPINE FINDINGS Alignment: Normal Vertebrae: No evidence of osseous infection. Canal/Cord: There is extensive spinal fluid collection preferentially in the ventral but also in the right more than left dorsal canal. Subarachnoid space is diffusely effaced. Maximal thickness is posterior to C2 at 9 mm. No cord signal abnormality. Posterior Fossa, vertebral arteries, paraspinal tissues: No retropharyngeal or other discrete soft tissue collection. Disc levels: C2-3: Unremarkable. C3-4: Small left foraminal protrusion with moderate narrowing C4-5: Unremarkable. C5-6: Disc narrowing and bulging with asymmetric left uncovertebral spurring. Left foraminal impingement C6-7: Right foraminal  protrusion mild narrowing. C7-T1:Unremarkable. MRI THORACIC SPINE FINDINGS Alignment:  Normal Vertebrae: No evidence of osteomyelitis or discitis. T12 butterfly vertebra. Canal/Cord: Cervical ventral epidural collection continues throughout the thoracic levels. There is also a focal dorsal component at T3-4 to T5-6, where thecal sac effacement is accentuated and there is cord flattening. No cord edema. Paraspinal and other soft tissues: Paraspinous phlegmon on the left at T8-T12, with new complex left pleural effusion and lower lobe atelectasis Disc levels: T5-6 central disc protrusion. MRI LUMBAR SPINE FINDINGS Segmentation:  5 lumbar type vertebral bodies Alignment:  Grade 1 anterolisthesis at L5-S1. Vertebrae: Marrow edema and heterogeneous enhancement within the L2, L3, and L4 spinous processes. No discitis or facet edema. Conus medullaris: Extends to the L1 level and is non edematous. There is extensive epidural collection completely effacing the thecal sac throughout the lumbar spine until L4-5 and below where the collection becomes ventral and right eccentric. The infection communicates with extensive bilateral abscess within the intrinsic back muscles via the interspinous space at L3-4. Patient had recent subcutaneous collection and left buttocks collection drainage, with packing seen in place. This midline, upper subcutaneous collection communicates with the paravertebral abscess along its superior margin based on postcontrast axial images. Paraspinal and other soft tissues: As above.  Distended bladder Disc levels: Chronic bilateral pars defects at L5. Critical Value/emergent results were called by telephone at the time of interpretation on 03/22/2018 at 3:04 pm to Dr. Thedore Mins , who verbally acknowledged these results. IMPRESSION: 1. Epidural abscess from C2 to sacrum as described. Maximal cord compression from T3-4 to T5-6. The thecal sac is completely effaced from L1 to L4-5. At the L3-4 interspinous space  the spinal abscess communicates with large bilateral abscesses within the intrinsic back muscles. There is osteomyelitis of the L2, L3, and L4 spinous processes. No discitis or facet arthritis. 2. Left paravertebral abscess along the lower thoracic spine with small left empyema that is new from CT 2 days ago. 3. Distended bladder Electronically Signed   By: Marnee Spring M.D.   On: 03/22/2018 15:11   Mr Lumbar Spine W Wo Contrast  Result Date: 03/22/2018 CLINICAL DATA:  Spine infection. EXAM: MRI TOTAL SPINE WITHOUT AND WITH CONTRAST TECHNIQUE: Multisequence MR  imaging of the spine from the cervical spine to the sacrum was performed prior to and following IV contrast administration. CONTRAST:  6 cc Gadavist intravenous COMPARISON:  CT of the thoracic and lumbar spine from 2 days ago FINDINGS: MRI CERVICAL SPINE FINDINGS Alignment: Normal Vertebrae: No evidence of osseous infection. Canal/Cord: There is extensive spinal fluid collection preferentially in the ventral but also in the right more than left dorsal canal. Subarachnoid space is diffusely effaced. Maximal thickness is posterior to C2 at 9 mm. No cord signal abnormality. Posterior Fossa, vertebral arteries, paraspinal tissues: No retropharyngeal or other discrete soft tissue collection. Disc levels: C2-3: Unremarkable. C3-4: Small left foraminal protrusion with moderate narrowing C4-5: Unremarkable. C5-6: Disc narrowing and bulging with asymmetric left uncovertebral spurring. Left foraminal impingement C6-7: Right foraminal protrusion mild narrowing. C7-T1:Unremarkable. MRI THORACIC SPINE FINDINGS Alignment:  Normal Vertebrae: No evidence of osteomyelitis or discitis. T12 butterfly vertebra. Canal/Cord: Cervical ventral epidural collection continues throughout the thoracic levels. There is also a focal dorsal component at T3-4 to T5-6, where thecal sac effacement is accentuated and there is cord flattening. No cord edema. Paraspinal and other soft  tissues: Paraspinous phlegmon on the left at T8-T12, with new complex left pleural effusion and lower lobe atelectasis Disc levels: T5-6 central disc protrusion. MRI LUMBAR SPINE FINDINGS Segmentation:  5 lumbar type vertebral bodies Alignment:  Grade 1 anterolisthesis at L5-S1. Vertebrae: Marrow edema and heterogeneous enhancement within the L2, L3, and L4 spinous processes. No discitis or facet edema. Conus medullaris: Extends to the L1 level and is non edematous. There is extensive epidural collection completely effacing the thecal sac throughout the lumbar spine until L4-5 and below where the collection becomes ventral and right eccentric. The infection communicates with extensive bilateral abscess within the intrinsic back muscles via the interspinous space at L3-4. Patient had recent subcutaneous collection and left buttocks collection drainage, with packing seen in place. This midline, upper subcutaneous collection communicates with the paravertebral abscess along its superior margin based on postcontrast axial images. Paraspinal and other soft tissues: As above.  Distended bladder Disc levels: Chronic bilateral pars defects at L5. Critical Value/emergent results were called by telephone at the time of interpretation on 03/22/2018 at 3:04 pm to Dr. Thedore Mins , who verbally acknowledged these results. IMPRESSION: 1. Epidural abscess from C2 to sacrum as described. Maximal cord compression from T3-4 to T5-6. The thecal sac is completely effaced from L1 to L4-5. At the L3-4 interspinous space the spinal abscess communicates with large bilateral abscesses within the intrinsic back muscles. There is osteomyelitis of the L2, L3, and L4 spinous processes. No discitis or facet arthritis. 2. Left paravertebral abscess along the lower thoracic spine with small left empyema that is new from CT 2 days ago. 3. Distended bladder Electronically Signed   By: Marnee Spring M.D.   On: 03/22/2018 15:11   Dg Chest Port 1  View  Result Date: 03/30/2018 CLINICAL DATA:  Shortness of breath EXAM: PORTABLE CHEST 1 VIEW COMPARISON:  03/30/2018 at 0227 hours FINDINGS: Moderate layering left pleural effusion, increased. Left lower lobe opacity, atelectasis versus pneumonia. Right lung is clear.  No pneumothorax. The heart is normal in size. IMPRESSION: Moderate layering left pleural effusion, increased. Left lower lobe opacity, atelectasis versus pneumonia. Electronically Signed   By: Charline Bills M.D.   On: 03/30/2018 19:06   Dg Chest Port 1 View  Result Date: 03/30/2018 CLINICAL DATA:  Chest pain EXAM: PORTABLE CHEST 1 VIEW COMPARISON:  None. FINDINGS: Retrocardiac opacity,  atelectasis versus pneumonia. Possible small left pleural effusion. Right lung is clear. No pneumothorax. The heart is normal in size. IMPRESSION: Retrocardiac opacity, atelectasis versus pneumonia. Possible small left pleural effusion. Electronically Signed   By: Charline Bills M.D.   On: 03/30/2018 02:49   [place] Ekg Site Rite  Result Date: 03/21/2018 If Site Rite image not attached, placement could not be confirmed due to current cardiac rhythm.    LOS: 11 days   Signature  Susa Raring M.D on 03/31/2018 at 8:47 AM  To page go to [email or web address] - password [name]

## 2018-03-31 NOTE — Plan of Care (Signed)
  Problem: Education: Goal: Knowledge of General Education information will improve Description Including pain rating scale, medication(s)/side effects and non-pharmacologic comfort measures Outcome: Progressing   Problem: Health Behavior/Discharge Planning: Goal: Ability to manage health-related needs will improve Outcome: Progressing   Problem: Clinical Measurements: Goal: Ability to maintain clinical measurements within normal limits will improve Outcome: Progressing Goal: Will remain free from infection Outcome: Progressing Note:  Antibiotics administered per orders, see MAR Goal: Diagnostic test results will improve Outcome: Progressing Goal: Respiratory complications will improve Outcome: Progressing Goal: Cardiovascular complication will be avoided Outcome: Progressing   Problem: Nutrition: Goal: Adequate nutrition will be maintained Outcome: Progressing Note:  Good appetite, carb mod diet   Problem: Elimination: Goal: Will not experience complications related to bowel motility Outcome: Progressing Note:  Normal BMs, last 10/2  Goal: Will not experience complications related to urinary retention Outcome: Progressing Note:  Foley in place for retention   Problem: Pain Managment: Goal: General experience of comfort will improve Outcome: Progressing   Problem: Safety: Goal: Ability to remain free from injury will improve Outcome: Progressing   Problem: Skin Integrity: Goal: Risk for impaired skin integrity will decrease Outcome: Progressing

## 2018-03-31 NOTE — Progress Notes (Signed)
Physical Therapy Wound Treatment Patient Details  Name: Tony Long MRN: [id number] Date of Birth: 02/13/1976  Today's Date: 03/31/2018 Time: 8563-1497 Time Calculation (min): 41 min  Subjective  Subjective: Pleasant and agreeable to hydrotherapy Patient and Family Stated Goals: Heal wound Prior Treatments: 03/22/18 I&D lumbar wounds  Pain Score:  Pt tolerated treatment well with minimal complaints of pain.   Wound Assessment  Wound / Incision (Open or Dehisced) 03/23/18 Incision - Open Lumbar Upper (Active)  Dressing Type ABD;Gauze (Comment);Barrier Film (skin prep);Moist to dry 03/31/2018 12:00 PM  Dressing Changed Changed 03/31/2018 12:00 PM  Dressing Status Clean;Dry;Intact 03/31/2018 12:00 PM  Dressing Change Frequency Daily 03/31/2018 12:00 PM  Site / Wound Assessment Red;Yellow 03/31/2018 12:00 PM  % Wound base Red or Granulating 95% 03/31/2018 12:00 PM  % Wound base Yellow/Fibrinous Exudate 5% 03/31/2018 12:00 PM  % Wound base Black/Eschar 0% 03/31/2018 12:00 PM  % Wound base Other/Granulation Tissue (Comment) 0% 03/31/2018 12:00 PM  Peri-wound Assessment Intact 03/31/2018 12:00 PM  Wound Length (cm) 3 cm 03/30/2018  2:24 PM  Wound Width (cm) 1.7 cm 03/30/2018  2:24 PM  Wound Depth (cm) 1 cm 03/30/2018  2:24 PM  Wound Volume (cm^3) 5.1 cm^3 03/30/2018  2:24 PM  Wound Surface Area (cm^2) 5.1 cm^2 03/30/2018  2:24 PM  Undermining (cm) 3.0 cm at 12:00, 1.8 cm at 2:00, 3.2 cm at 3:00, 3.3 cm from 4:00-8:00, 5.4 cm at 11:00 03/30/2018  2:24 PM  Margins Unattached edges (unapproximated) 03/31/2018 12:00 PM  Closure None 03/31/2018 12:00 PM  Drainage Amount None 03/31/2018 12:00 PM  Drainage Description Purulent 03/30/2018  2:24 PM  Treatment Debridement (Selective);Hydrotherapy (Pulse lavage);Packing (Saline gauze) 03/31/2018 12:00 PM     Wound / Incision (Open or Dehisced) 03/23/18 Incision - Open Lumbar Lower (Active)  Dressing Type Gauze (Comment);Moist to dry;Barrier Film (skin prep);ABD  03/31/2018 12:00 PM  Dressing Changed Changed 03/31/2018 12:00 PM  Dressing Status Clean;Dry;Intact 03/31/2018 12:00 PM  Dressing Change Frequency Daily 03/31/2018 12:00 PM  Site / Wound Assessment Pink;Yellow;Other (Comment) 03/31/2018 12:00 PM  % Wound base Red or Granulating 30% 03/31/2018 12:00 PM  % Wound base Yellow/Fibrinous Exudate 20% 03/31/2018 12:00 PM  % Wound base Black/Eschar 0% 03/31/2018 12:00 PM  % Wound base Other/Granulation Tissue (Comment) 50% (fascia) 03/31/2018 12:00 PM  Peri-wound Assessment Pink 03/31/2018 12:00 PM  Wound Length (cm) 3.7 cm 03/30/2018  2:24 PM  Wound Width (cm) 2 cm 03/30/2018  2:24 PM  Wound Depth (cm) 1.7 cm 03/30/2018  2:24 PM  Wound Volume (cm^3) 12.58 cm^3 03/30/2018  2:24 PM  Wound Surface Area (cm^2) 7.4 cm^2 03/30/2018  2:24 PM  Undermining (cm) 3.4 cm from 1:00-2:00, 3.1 cm at 3:00, 3.9 cm from 3:00-5:00, 2.5 cm at 6:00, 2.8 cm from 6:00-8:00, 4.6 cm at 9:00, 6.1 cm at 11:00, and 1.7 cm at 12:00 03/30/2018  2:24 PM  Margins Unattached edges (unapproximated) 03/31/2018 12:00 PM  Closure None 03/31/2018 12:00 PM  Drainage Amount Moderate 03/31/2018 12:00 PM  Drainage Description Purulent 03/31/2018 12:00 PM  Treatment Debridement (Selective);Hydrotherapy (Pulse lavage);Packing (Saline gauze) 03/31/2018 12:00 PM   Hydrotherapy Pulsed lavage therapy - wound location: x2 lumbar wounds Pulsed Lavage with Suction (psi): 12 psi Pulsed Lavage with Suction - Normal Saline Used: 1000 mL Pulsed Lavage Tip: Tip with splash shield Selective Debridement Selective Debridement - Location: x2 lumbar wounds Selective Debridement - Tools Used: Forceps;Scissors Selective Debridement - Tissue Removed: yellow necrotic/unviable tissue   Wound Assessment and Plan  Wound Therapy -  Assess/Plan/Recommendations Wound Therapy - Clinical Statement: Superior wound appears clean and a minimal amount of nonviable tissue was debrided this session. Discussed with PA and hydrotherapy will  only continue with inferior wound at this time. This patient will continue to benefit from hydrotherapy to reduce bioburden and promote wound bed healing.  Wound Therapy - Functional Problem List: Acute pain, decreased tolerance for position changes Factors Delaying/Impairing Wound Healing: Diabetes Mellitus;Infection - systemic/local Hydrotherapy Plan: Debridement;Dressing change;Patient/family education;Pulsatile lavage with suction Wound Therapy - Frequency: 6X / week Wound Therapy - Current Recommendations: PT Wound Therapy - Follow Up Recommendations: Home health RN Wound Plan: See above  Wound Therapy Goals- Improve the function of patient's integumentary system by progressing the wound(s) through the phases of wound healing (inflammation - proliferation - remodeling) by: Decrease Necrotic Tissue to: 0% both wounds Decrease Necrotic Tissue - Progress: Progressing toward goal Increase Granulation Tissue to: 100% both wounds Increase Granulation Tissue - Progress: Progressing toward goal Improve Drainage Characteristics: Min;Serous Improve Drainage Characteristics - Progress: Progressing toward goal Goals/treatment plan/discharge plan were made with and agreed upon by patient/family: Yes Time For Goal Achievement: 7 days Wound Therapy - Potential for Goals: Good  Goals will be updated until maximal potential achieved or discharge criteria met.  Discharge criteria: when goals achieved, discharge from hospital, MD decision/surgical intervention, no progress towards goals, refusal/missing three consecutive treatments without notification or medical reason.  GP     Thelma Comp 03/31/2018, 12:40 PM   Rolinda Roan, PT, DPT Acute Rehabilitation Services Pager: [phone] Office: [phone]

## 2018-03-31 NOTE — Progress Notes (Signed)
Patient suddenly became confused and agitated after waking up and attempting to get out of bed to "go outside and smoke a cigarette," pt states he is in [place] and that this is not a hospital and disoriented to time. RN attempted to reorient patient, but agitation increased. Patient stood at side of bed with RN and MD notified of agitation/confusion. Haldol given per MD order, see MAR. Patient able to be redirected to bed and is resting at this time, no apparent distress, VSS. Will continue to monitor.

## 2018-04-01 LAB — GLUCOSE, CAPILLARY
GLUCOSE-CAPILLARY: 442 mg/dL — AB (ref 70–99)
Glucose-Capillary: 217 mg/dL — ABNORMAL HIGH (ref 70–99)
Glucose-Capillary: 262 mg/dL — ABNORMAL HIGH (ref 70–99)
Glucose-Capillary: 350 mg/dL — ABNORMAL HIGH (ref 70–99)

## 2018-04-01 LAB — BASIC METABOLIC PANEL
Anion gap: 6 (ref 5–15)
BUN: 8 mg/dL (ref 6–20)
CHLORIDE: 98 mmol/L (ref 98–111)
CO2: 30 mmol/L (ref 22–32)
CREATININE: 0.58 mg/dL — AB (ref 0.61–1.24)
Calcium: 7.7 mg/dL — ABNORMAL LOW (ref 8.9–10.3)
GFR calc Af Amer: >60 mL/min (ref >60)
GFR calc non Af Amer: >60 mL/min (ref >60)
Glucose, Bld: 305 mg/dL — ABNORMAL HIGH (ref 70–99)
POTASSIUM: 3.5 mmol/L (ref 3.5–5.1)
Sodium: 134 mmol/L — ABNORMAL LOW (ref 135–145)

## 2018-04-01 LAB — FOLATE: Folate: 9.5 ng/mL (ref >5.9)

## 2018-04-01 LAB — IRON AND TIBC
Iron: 31 ug/dL — ABNORMAL LOW (ref 45–182)
SATURATION RATIOS: 20 % (ref 17.9–39.5)
TIBC: 157 ug/dL — ABNORMAL LOW (ref 250–450)
UIBC: 126 ug/dL

## 2018-04-01 LAB — RETICULOCYTES
RBC.: 3.08 MIL/uL — AB (ref 4.22–5.81)
RETIC COUNT ABSOLUTE: 58.5 10*3/uL (ref 19.0–186.0)
RETIC CT PCT: 1.9 % (ref 0.4–3.1)

## 2018-04-01 LAB — URINE CULTURE: Culture: NO GROWTH

## 2018-04-01 LAB — MAGNESIUM: Magnesium: 1.9 mg/dL (ref 1.7–2.4)

## 2018-04-01 LAB — VITAMIN B12: VITAMIN B 12: 886 pg/mL (ref 180–914)

## 2018-04-01 LAB — PROCALCITONIN: Procalcitonin: 0.39 ng/mL

## 2018-04-01 LAB — FERRITIN: Ferritin: 583 ng/mL — ABNORMAL HIGH (ref 24–336)

## 2018-04-01 MED ORDER — BUPRENORPHINE HCL-NALOXONE HCL 2-0.5 MG SL SUBL
2.0000 | SUBLINGUAL_TABLET | Freq: Every day | SUBLINGUAL | Status: DC
  Administered 2018-04-01 – 2018-04-17 (×17): 2 via SUBLINGUAL
  Filled 2018-04-01 (×11): qty 2
  Filled 2018-04-01: qty 1
  Filled 2018-04-01 (×7): qty 2

## 2018-04-01 MED ORDER — INSULIN ASPART 100 UNIT/ML ~~LOC~~ SOLN
7.0000 [IU] | Freq: Once | SUBCUTANEOUS | Status: AC
  Administered 2018-04-01: 7 [IU] via SUBCUTANEOUS

## 2018-04-01 MED ORDER — HYDROCORTISONE NA SUCCINATE PF 100 MG IJ SOLR
50.0000 mg | Freq: Three times a day (TID) | INTRAMUSCULAR | Status: DC
  Administered 2018-04-01 – 2018-04-03 (×5): 50 mg via INTRAVENOUS
  Filled 2018-04-01 (×5): qty 2

## 2018-04-01 MED ORDER — METOPROLOL TARTRATE 50 MG PO TABS
50.0000 mg | ORAL_TABLET | Freq: Two times a day (BID) | ORAL | Status: DC
  Administered 2018-04-01 – 2018-04-17 (×31): 50 mg via ORAL
  Filled 2018-04-01 (×34): qty 1

## 2018-04-01 MED ORDER — NAFCILLIN SODIUM 2 G IJ SOLR
2.0000 g | INTRAMUSCULAR | Status: DC
  Filled 2018-04-01 (×3): qty 2000

## 2018-04-01 MED ORDER — SODIUM CHLORIDE 0.9 % IV SOLN
2.0000 g | INTRAVENOUS | Status: DC
  Administered 2018-04-01 – 2018-04-16 (×89): 2 g via INTRAVENOUS
  Filled 2018-04-01 (×93): qty 2000

## 2018-04-01 NOTE — Progress Notes (Signed)
Results for ZACKARIAH, VANDERPOL (MRN [id number]) as of 04/01/2018 13:43  Ref. Range 03/31/2018 12:28 03/31/2018 16:52 03/31/2018 21:40 04/01/2018 08:31 04/01/2018 13:09  Glucose-Capillary Latest Ref Range: 70 - 99 mg/dL 409 (H) 811 (H) 914 (H) 217 (H) 262 (H)  Note that blood sugars have increased with steroids. May consider increasing Lantus to 30 units q HS and increase Novolog correction to q 4 hours (while on steroids).  As steroids are titrated down, will also need insulin reduced.  Smith Mince RN BSN CDE Diabetes Coordinator Pager: [phone]  8am-5pm

## 2018-04-01 NOTE — Progress Notes (Addendum)
PROGRESS NOTE        PATIENT DETAILS  Name: Tony Long  Age: 42 y.o.  Sex: male Date of Birth: May 29, 1976 Admit Date: 03/20/2018 Admitting Physician Kendell Bane, MD  ZOX:[id number], Rolm Gala, FNP   Brief Narrative: [name] a 42 y.o. male history of IVDA, DM-2, hypertension admitted for worsening back pain-further evaluation revealed MSSA bacteremia with tricuspid valve endocarditis, epidural abscess involving C2 through sacrum, and numerous soft tissue back abscesses.  Evaluated by general surgery-underwent I&D of her lower back soft tissue abscess, evaluated by neurosurgery-not felt to be a candidate for decompressive surgery due to extensive nature of the disease and lack of any significant neurological findings.  ID following with plans to continue Ancef with stop date of 05/25/2018.  See below for further details  Subjective: Patient in bed, appears comfortable, denies any headache, no fever, no chest pain or pressure, no shortness of breath , no abdominal pain. No focal weakness.  Assessment/Plan:   MSSA Abscess with tricuspid valve endocarditis, extensive epidural abscess (C2 through sacrum) and large soft tissue lower back abscess Blood cultures negative: due to extensive nature of the epidural abscess and lack of concerning neurological findings-neurosurgery does not recommend decompressive surgery.  In by ID and current recommendation is total of 6 weeks of antibiotics with a tentative stop date of 05/25/2018.  Given history of IVDA-not a candidate for outpatient IV antimicrobial therapy. He has received a PICC line, continue IV antibiotics through PEG.   If patient cannot be placed to SNF-he will likely remain inpatient.  Patient aware of the extensive nature of this infection-with life-threatening and life disabling risks including quadriplegia along with life-threatening sepsis with this present infection which is nonsurgical.  This was explained by  me to the patient several times along with his parents bedside on 03/22/2018 and 03/23/2018, 03/30/2018.  Patient was doing well on IV cefazolin since admission till about 03/30/2018 when he had another abrupt run off sepsis with temp of 103, hypotension to 60s requiring several liters of IV fluids and developed some new left-sided lower extremity weakness, ID and neurosurgery and PCCM were called and his antibiotics were broadened from cefazolin to IV vancomycin and meropenem on 03/30/2018.  On 03/31/2018 his sepsis physiology has resolved and he is much better although he has now leg weakness is remarkably improved as well.  Neurosurgery is following.  He has undergone repeat echocardiogram, CT angios chest which were unremarkable, MRI see, T and L-spine are being repeated.  Neurosurgery following.  Prognosis remains poor and again family and patient were updated by me in detail on 03/30/2018.  Remains nonsurgical, now DNR - made by PCCM.  Blood Cultures were repeated on 03/30/2018 which were negative so far, repeat blood cultures were ordered by ID on 04/01/2018 which will be monitored as well.    New onset of left-sided pleuritic chest pain starting on late night 03/29/2018.  Does have lateral T wave inversions and ST depression on EKG, repeat EKG improved, troponin stable he is currently on aspirin, low-dose beta-blocker and statin as tolerated, repeat echocardiogram unremarkable.  Chest pain-free now.  CT chest noted with small pleural effusion which is nonspecific will continue to monitor as symptoms are better.  Hypertension: Monitor on beta-blocker.  Constipation: Had bowel movement overnight-probably secondary to narcotic use-have placed on scheduled MiraLAX and senna.  Acute urinary  retention: Required Foley catheter placement on 03/26/2018 along with Flomax, continue for now.  Will try removing Foley catheter on 04/02/2018 and monitor.  Cocaine/Subutex abuse: Withdrawal symptoms much better-on 9/25  patient developed excessive sedation-Klonopin/Flexeril has been changed to as needed dosing-clonidine being tapered down.  On 03/28/2018 he requested me that he be put back on his Suboxone, he does not want short-acting pain medications anymore, changes will be made per his request.    Polysubstance abuse/IVDA: Counseled to quit.  Hypokalemia and hypomagnesemia.  Both replaced.  Monitor.  Anemia.  Normocytic, he had some chronic anemia upon admission as well and now worse due to him dilution from IV fluids.  Check anemia panel and monitor no need for transfusion now.    Likely DM-2: Poor outpatient control, continue Lantus and sliding scale.  Lantus dose adjusted for better control.   CBG (last 3)  Recent Labs    03/31/18 1652 03/31/18 2140 04/01/18 0831  GLUCAP 273* 292* 217*   Lab Results  Component Value Date   HGBA1C 12.6 (A) 02/26/2018     DVT Prophylaxis: Prophylactic heparin  Code Status: Full code  Family Communication: Parents multiple times clearly explained prognosis remains poor, now DNR.  They have clear understanding of his medical situation.  Disposition Plan: Briefly moved to ICU on 03/30/2018, now stabilized moved back to stepdown on 03/31/2018 and monitor  Antimicrobial agents: Anti-infectives (From admission, onward)   Start     Dose/Rate Route Frequency Ordered Stop   03/31/18 0200  vancomycin (VANCOCIN) IVPB 750 mg/150 ml premix     750 mg 150 mL/hr over 60 Minutes Intravenous Every 8 hours 03/30/18 1628     03/31/18 0100  meropenem (MERREM) 1 g in sodium chloride 0.9 % 100 mL IVPB     1 g 200 mL/hr over 30 Minutes Intravenous Every 8 hours 03/30/18 1628     03/30/18 1630  vancomycin (VANCOCIN) 1,500 mg in sodium chloride 0.9 % 500 mL IVPB     1,500 mg 250 mL/hr over 120 Minutes Intravenous NOW 03/30/18 1619 03/30/18 1925   03/30/18 1630  meropenem (MERREM) 2 g in sodium chloride 0.9 % 100 mL IVPB     2 g 200 mL/hr over 30 Minutes Intravenous NOW  03/30/18 1619 03/30/18 1754   03/21/18 0930  ceFAZolin (ANCEF) IVPB 2g/100 mL premix  Status:  Discontinued     2 g 200 mL/hr over 30 Minutes Intravenous Every 8 hours 03/21/18 0920 03/30/18 1616      Procedures:   MRI C, T and L-spine done 03/30/2018 and 03/31/2018.    CT angiogram chest 03/30/2018.  1. No pulmonary embolus. 2. Moderate loculated left pleural effusion. Adjacent airspace disease likely compressive atelectasis, pneumonia not excluded in the setting of spinal infection and endocarditis. Mild right pleural thickening or loculated pleural fluid in the upper right hemithorax.  Repeat echocardiogram 03/30/2018 - Left ventricle: The cavity size was normal. Wall thickness was normal. Systolic function was normal. The estimated ejection fraction was in the range of 60% to 65%. Wall motion was normal; there were no regional wall motion abnormalities. Left ventricular diastolic function parameters were normal.  TEE - - Left ventricle: The cavity size was normal. Wall thickness wasnormal. Systolic function was normal. The estimated ejectionfraction was in the range of 60% to 65%. - Aortic valve: No evidence of vegetation. - Mitral valve: No evidence of vegetation. - Left atrium: No evidence of thrombus in the atrial cavity or appendage. No evidence of thrombus  in the appendage. - Right atrium: No evidence of thrombus in the atrial cavity orappendage. - Tricuspid valve: There was a vegetation. There was a small (1.2cmx .6cm), mobile vegetation on the septal leaflet. There was mildregurgitation. - Pulmonic valve: No evidence of vegetation.  Impressions:   Tricuspid valve endocarditis. Discussed with primary team   MRI C-T-L Spine - 1. Epidural abscess from C2 to sacrum as described. Maximal cord compression from T3-4 to T5-6. The thecal sac is completely effaced from L1 to L4-5. At the L3-4 interspinous space the spinal abscess communicates with large bilateral abscesses within  the intrinsic back muscles. There is osteomyelitis of the L2, L3, and L4 spinous processes. No discitis or facet arthritis. 2. Left paravertebral abscess along the lower thoracic spine with small left empyema that is new from CT 2 days ago. 3. Distended bladder  CT - 1. Evidence of cellulitis involving the right side of the base of the neck extending into the right supraclavicular region with fluid and gas in the soft tissues at the base of the right side of the neck. 2. No significant abnormality of the thoracic spine. 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  CT -  1. Extensive abnormal fluid collection in the subcutaneous fat of the midline of the back with underlying marked abnormality of the posterior paraspinal musculature from L1-2 through S3. This is worrisome for subcutaneous abscess and myositis. 2. Moth-eaten appearance of the spinous processes of L3 and L4 consistent with osteomyelitis. 3. No discrete epidural abscess. However, the abnormal edema in the paraspinal musculature extends to the posterior aspect of the spinal canal at L3-4. 4. MRI with and without contrast may better define the extent of the soft tissue and infection and could detect epidural extension that is not apparent on this unenhanced CT scan.  9/22>> PICC line  I&D by [facility] of back soft tissue abscess on -  03/22/18    CONSULTS: N.Surgery, ID, general surgery, PCCM  Time spent: 25- minutes-Greater than 50% of this time was spent in counseling, explanation of diagnosis, planning of further management, and coordination of care.  MEDICATIONS: Scheduled Meds: . aspirin  81 mg Oral Daily  . atorvastatin  40 mg Oral q1800  . buprenorphine-naloxone  2 tablet Sublingual Daily  . gabapentin  300 mg Oral TID  . heparin injection (subcutaneous)  5,000 Units Subcutaneous Q8H  . hydrocortisone sod succinate (SOLU-CORTEF) inj  50 mg Intravenous Q8H  . insulin aspart  0-5 Units Subcutaneous QHS  . insulin aspart  0-9 Units  Subcutaneous TID WC  . insulin glargine  25 Units Subcutaneous QHS  . mouth rinse  15 mL Mouth Rinse BID  . nicotine  21 mg Transdermal Daily  . polyethylene glycol  17 g Oral BID  . senna-docusate  2 tablet Oral QHS  . sodium chloride flush  10-40 mL Intracatheter Q12H  . tamsulosin  0.4 mg Oral Daily   Continuous Infusions: . meropenem (MERREM) IV 1 g (04/01/18 0830)  . vancomycin 750 mg (04/01/18 0150)   PRN Meds:.acetaminophen, bisacodyl, clonazepam, cyclobenzaprine, hydrALAZINE, LORazepam, magnesium hydroxide, metoprolol tartrate, nitroGLYCERIN, sodium phosphate   PHYSICAL EXAM: Vital signs: Vitals:   04/01/18 0203 04/01/18 0418 04/01/18 0605 04/01/18 0837  BP: 127/77  136/89 (!) 143/88  Pulse: (!) 105  94 (!) 53  Resp: (!) 28  19 14   Temp: 98.7 F (37.1 C) 98.5 F (36.9 C) 98 F (36.7 C) 98.2 F (36.8 C)  TempSrc: Oral  Oral Oral  SpO2: 90%  92% 91%  Weight:      Height:       Filed Weights   03/21/18 0602  Weight: 68 kg   Body mass index is 20.92 kg/m.   Exam  Awake Alert, Oriented X 3, No new F.N deficits, Normal affect [place].AT,PERRAL Supple Neck,No JVD, No cervical lymphadenopathy appriciated.  Symmetrical Chest wall movement, Good air movement bilaterally, CTAB RRR,No Gallops, Rubs or new Murmurs, No Parasternal Heave +ve B.Sounds, Abd Soft, No tenderness, No organomegaly appriciated, No rebound - guarding or rigidity. No Cyanosis, Clubbing or edema, No new Rash or bruise Foley catheter in place, L Leg 4-5/5  I have personally reviewed following labs and imaging studies  LABORATORY DATA: CBC: Recent Labs  Lab 03/26/18 0640 03/30/18 0227 03/30/18 1837 03/31/18 0336  WBC 15.4* 14.5* 25.2* 24.8*  HGB 10.2* 9.2* 7.6* 7.5*  HCT 31.4* 28.6* 24.3* 23.7*  MCV 95.2 95.7 98.4 97.1  PLT 289 382 365 358    Basic Metabolic Panel: Recent Labs  Lab 03/26/18 0640 03/30/18 0227 03/31/18 0336 04/01/18 0311  NA 138 135 135 134*  K 3.5 2.8* 3.7 3.5  CL  103 90* 94* 98  CO2 22 34* 31 30  GLUCOSE 319* 230* 266* 305*  BUN 15 6 9 8   CREATININE 1.08 0.68 0.62 0.58*  CALCIUM 8.9 8.5* 7.7* 7.7*  MG  --  1.6* 2.0 1.9  PHOS  --   --  2.7  --     GFR: Estimated Creatinine Clearance: 116.9 mL/min (A) (by C-G formula based on SCr of 0.58 mg/dL (L)).  Liver Function Tests: No results for input(s): AST, ALT, ALKPHOS, BILITOT, PROT, ALBUMIN in the last 168 hours. No results for input(s): LIPASE, AMYLASE in the last 168 hours. No results for input(s): AMMONIA in the last 168 hours.  Coagulation Profile: No results for input(s): INR, PROTIME in the last 168 hours.  Cardiac Enzymes: Recent Labs  Lab 03/30/18 0219 03/30/18 1222 03/30/18 1620  TROPONINI <0.03 <0.03 <0.03    BNP (last 3 results) No results for input(s): PROBNP in the last 8760 hours.  HbA1C: No results for input(s): HGBA1C in the last 72 hours.  CBG: Recent Labs  Lab 03/31/18 0730 03/31/18 1228 03/31/18 1652 03/31/18 2140 04/01/18 0831  GLUCAP 265* 349* 273* 292* 217*    Lipid Profile: Recent Labs    03/31/18 0336  CHOL 74  HDL 23*  LDLCALC 41  TRIG 48  CHOLHDL 3.2    Thyroid Function Tests: No results for input(s): TSH, T4TOTAL, FREET4, T3FREE, THYROIDAB in the last 72 hours.  Anemia Panel: No results for input(s): VITAMINB12, FOLATE, FERRITIN, TIBC, IRON, RETICCTPCT in the last 72 hours.  Urine analysis:    Component Value Date/Time   COLORURINE YELLOW 03/30/2018 1930   APPEARANCEUR CLEAR 03/30/2018 1930   LABSPEC 1.009 03/30/2018 1930   PHURINE 6.0 03/30/2018 1930   GLUCOSEU 50 (A) 03/30/2018 1930   HGBUR SMALL (A) 03/30/2018 1930   BILIRUBINUR NEGATIVE 03/30/2018 1930   BILIRUBINUR small 02/26/2018 1202   KETONESUR NEGATIVE 03/30/2018 1930   PROTEINUR NEGATIVE 03/30/2018 1930   UROBILINOGEN 1.0 02/26/2018 1202   UROBILINOGEN 1.0 12/11/2016 1020   NITRITE NEGATIVE 03/30/2018 1930   LEUKOCYTESUR TRACE (A) 03/30/2018 1930    Sepsis  Labs: Lactic Acid, Venous    Component Value Date/Time   LATICACIDVEN 1.2 03/30/2018 1837    MICROBIOLOGY: Recent Results (from the past 240 hour(s))  Culture, blood (Routine X 2) w Reflex to ID Panel  Status: None   Collection Time: 03/23/18  4:39 AM  Result Value Ref Range Status   Specimen Description BLOOD RIGHT HAND  Final   Special Requests   Final    BOTTLES DRAWN AEROBIC ONLY Blood Culture adequate volume   Culture   Final    NO GROWTH 5 DAYS Performed at [facility], [address] [place], [place] [address]    Report Status 03/28/2018 FINAL  Final  Culture, blood (single)     Status: None (Preliminary result)   Collection Time: 03/30/18  4:12 PM  Result Value Ref Range Status   Specimen Description BLOOD RIGHT ANTECUBITAL  Final   Special Requests   Final    BOTTLES DRAWN AEROBIC ONLY Blood Culture adequate volume   Culture   Final    NO GROWTH 2 DAYS Performed at [facility], [address], [place], [place] [address]    Report Status PENDING  Incomplete  Culture, Urine     Status: None   Collection Time: 03/30/18  4:20 PM  Result Value Ref Range Status   Specimen Description URINE, CATHETERIZED  Final   Special Requests NONE  Final   Culture   Final    NO GROWTH Performed at [facility], [address], [place], [place] [address]    Report Status 04/01/2018 FINAL  Final    RADIOLOGY STUDIES/RESULTS: Ct Angio Chest Pe W Or Wo Contrast  Result Date: 03/30/2018 CLINICAL DATA:  Shortness of breath PE suspected, high pretest prob. Patient with known epidural abscess and endocarditis. Now with left-sided chest pain. EXAM: CT ANGIOGRAPHY CHEST WITH CONTRAST TECHNIQUE: Multidetector CT imaging of the chest was performed using the standard protocol during bolus administration of intravenous contrast. Multiplanar CT image reconstructions and MIPs were obtained to evaluate the vascular anatomy. CONTRAST:  54mL ISOVUE-370 IOPAMIDOL (ISOVUE-370)  INJECTION 76% COMPARISON:  Chest radiographs earlier this day. FINDINGS: Cardiovascular: There are no filling defects within the pulmonary arteries to suggest pulmonary embolus. The thoracic aorta is normal in caliber without dissection. Mild cardiomegaly. Small amount pericardial fluid. Mediastinum/Nodes: Loculated left pleural effusion tracks along the left aspect of the mediastinum. Small left hilar nodes without bulky adenopathy. No enlarged mediastinal nodes. No dominant thyroid nodule. The esophagus is decompressed. Lungs/Pleura: Moderate left pleural effusion is loculated and tracks along the posterior, lateral, and medial hemithorax. Adjacent airspace disease in the left lower lobe, favoring compressive atelectasis, with adjacent compressive atelectasis in the right upper lobe. Pleural thickening versus loculated pleural effusion the upper medial right hemithorax, with additional areas of pleural thickening dependently. Dependent atelectasis in the right lower lobe. Trachea and bronchi are patent. Upper Abdomen: No acute findings. Musculoskeletal: Patient with known epidural abscess, not well delineated by CT. No bony destructive change. Review of the MIP images confirms the above findings. IMPRESSION: 1. No pulmonary embolus. 2. Moderate loculated left pleural effusion. Adjacent airspace disease likely compressive atelectasis, pneumonia not excluded in the setting of spinal infection and endocarditis. Mild right pleural thickening or loculated pleural fluid in the upper right hemithorax. Electronically Signed   By: Narda Rutherford M.D.   On: 03/30/2018 23:21   Ct Thoracic Spine Wo Contrast  Result Date: 03/20/2018 CLINICAL DATA:  Progressive back pain.  Swelling of the lower back. EXAM: CT THORACIC SPINE WITHOUT CONTRAST TECHNIQUE: Multidetector CT images of the thoracic were obtained using the standard protocol without intravenous contrast. COMPARISON:  None. FINDINGS: Alignment: Normal. 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  Paraspinal and other soft tissues: There is abnormal gas and fluid in the soft tissues of the right side of the base of the neck and in the right supraclavicular region. Paraspinal soft tissues appear normal throughout the thoracic spine. Disc levels: There is no evidence of disc protrusion or significant disc bulging or spinal or foraminal stenosis or other significant abnormality of the thoracic spine. IMPRESSION: 1. Evidence of cellulitis involving the right side of the base of the neck extending into the right supraclavicular region with fluid and gas in the soft tissues at the base of the right side of the neck. 2. No significant abnormality of the thoracic spine. 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 Electronically Signed   By: Francene Boyers M.D.   On: 03/20/2018 11:25   Ct Lumbar Spine Wo Contrast  Addendum Date: 03/20/2018   ADDENDUM REPORT: 03/20/2018 11:44 ADDENDUM: Critical Value/emergent results were called by telephone at the time of interpretation on 03/20/2018 at 11:30 am to Dr. Sharyn Creamer , who verbally acknowledged these results. Electronically Signed   By: Francene Boyers M.D.   On: 03/20/2018 11:44   Result Date: 03/20/2018 CLINICAL DATA:  Increasing low back pain and soft tissue swelling. EXAM: CT LUMBAR SPINE WITHOUT CONTRAST TECHNIQUE: Multidetector CT imaging of the lumbar spine was performed without intravenous contrast administration. Multiplanar CT image reconstructions were also generated. IV contrast could not be utilized due to the lack of an appropriate IV. COMPARISON:  None. FINDINGS: Segmentation: 5 lumbar type vertebrae. Alignment: Normal. Vertebrae: There is a moth-eaten appearance of the spinous processes of L3 and L4 which is worrisome for osteomyelitis. Bilateral pars defects at L5 with grade 1 spondylolisthesis. 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 Paraspinal and other soft tissues: There is an extensive abnormal fluid collection in  the subcutaneous soft tissues of the posterior aspect of the back extending from approximately L1-2 to S3. This fluid collection is lobulated and measures approximately 20 x 9 x 2.5 cm. It is centered slightly to the left of midline and has a mass effect upon the adjacent posterior paraspinal muscles. There is abnormal lucency in the underlying paraspinal muscles which could represent myositis. Disc levels: T11-12: No significant abnormality. Butterfly T12 vertebra. T12-L1: No significant abnormality. L1-2: Normal disc. Abnormal edema in the posterior paraspinal musculature with adjacent fluid collection in the subcutaneous fat of the posterior aspect of the back as described above. L2-3: Normal disc. L3-4: Normal disc. Lucency in the posterior paraspinal soft tissues extends to the posterior aspect of the thecal sac on image 80 of series 4 but there is no discrete epidural abscess. L4-5: Normal disc.  No evidence of epidural abscess. L5-S1: Grade 1 spondylolisthesis. No disc bulging or protrusion. Bilateral pars defects. No visible epidural abscess. IMPRESSION: 1. Extensive abnormal fluid collection in the subcutaneous fat of the midline of the back with underlying marked abnormality of the posterior paraspinal musculature from L1-2 through S3. This is worrisome for subcutaneous abscess and myositis. 2. Moth-eaten appearance of the spinous processes of L3 and L4 consistent with osteomyelitis. 3. No discrete epidural abscess. However, the abnormal edema in the paraspinal musculature extends to the posterior aspect of the spinal canal at L3-4. 4. MRI with and without contrast may better define the extent of the soft tissue and infection and could detect epidural extension that is not apparent on this unenhanced CT scan. Electronically Signed: By: Francene Boyers M.D. On: 03/20/2018 11:18   Mr Cervical Spine Wo Contrast  Result Date: 03/30/2018 CLINICAL DATA:  42 year old male with history of MSSA bacteremia with  known endocarditis and epidural abscess extending from C2 through the sacrum. Recent episode of hypotension and acute lower extremity weakness. Evaluate abscess and possible spinal cord infarct. EXAM: MRI CERVICAL, THORACIC SPINE WITHOUT CONTRAST TECHNIQUE: Multiplanar and multiecho pulse sequences of the cervical spine, to include the craniocervical junction and cervicothoracic junction, and thoracic and lumbar spine, were obtained without intravenous contrast. COMPARISON:  Prior MRI from 03/22/2018. FINDINGS: MRI CERVICAL SPINE FINDINGS Alignment: Examination technically limited as the patient was unable to tolerate the full length of the exam. Additionally, images provided are degraded by motion artifact. Reversal of the normal cervical lordosis with apex at C5, stable. No interval listhesis or malalignment. Vertebrae: Vertebral body height maintained without acute or interval fracture. Bone marrow signal intensity diffusely decreased on T1 weighted imaging, suspected to be related to anemia and chronic disease. No discrete osseous lesions. No abnormal marrow edema. No evidence for interval discitis. Cord: Signal intensity within the cervical spinal cord is within normal limits. No findings to suggest interval cord infarction on this motion degraded and limited exam. Previously identified epidural collection involving the ventral epidural space extending from C2 inferiorly is decreased in size, now measuring up to 7 mm in maximal AP diameter at the level of C2. Collection diffusely involves the ventral epidural space, but also is seen dorsally as well. Slightly improved diffuse spinal stenosis with thecal sac patency. Posterior Fossa, vertebral arteries, paraspinal tissues: Visualized brain and posterior fossa within normal limits. Craniocervical junction normal. Scattered edema within the posterior paraspinous soft tissues. Normal intravascular flow voids seen within the vertebral arteries bilaterally. Disc  levels: C2-C3: Unremarkable. C3-C4: Small left foraminal protrusion with associated moderate left C4 foraminal stenosis. C4-C5:  Unremarkable. C5-C6: Left eccentric disc bulge with uncovertebral hypertrophy. Moderate left C6 foraminal narrowing. C6-C7: Right foraminal disc protrusion with associated moderate right C7 foraminal stenosis. C7-T1:  Unremarkable. MRI THORACIC SPINE FINDINGS Alignment: Examination markedly limited as the patient was unable to tolerate the full length of the exam. Sagittal T1, T2, and STIR sequences only were performed. No axial images obtained. Vertebral bodies normally aligned with preservation of the normal thoracic kyphosis. Vertebrae: Vertebral body height maintained without evidence for interval fracture. Butterfly vertebra noted at T12. Diffusely decreased T1 weighted signal intensity throughout the visualized bone marrow, like related to anemia chronic disease. No findings to suggest interval or new discitis or septic arthritis. Cord: Signal intensity within the thoracic spinal cord grossly within normal limits on these limited sagittal views. No definite cord signal abnormality or cord edema to suggest acute spinal cord infarction. Previously seen diffuse epidural collection appears overall decreased in size from previous, with improved spinal stenosis and thecal sac patency. Paraspinal and other soft tissues: Scattered edema seen within the posterior paraspinous soft tissues at the upper back/cervicothoracic junction. No discrete soft tissue collections. Disc levels: T5-6: Central disc protrusion indenting upon the ventral thoracic spinal cord, stable. IMPRESSION: 1. Technically limited exam due to motion artifact and the patient's inability to tolerate the full length of the exam. Cervical and thoracic spine only was imaged, and only sagittal sequences of the thoracic spine were obtained. 2. No imaging findings to suggest spinal cord infarction identified on this limited exam.  3. Interval improvement in diffuse epidural collection, decreased in size as compared to previous exam with improved diffuse spinal stenosis and thecal sac patency. No new discitis or facet arthritis. Electronically Signed   By: Janell Quiet.D.  On: 03/30/2018 22:52   Mr Thoracic Spine Wo Contrast  Result Date: 03/30/2018 CLINICAL DATA:  42 year old male with history of MSSA bacteremia with known endocarditis and epidural abscess extending from C2 through the sacrum. Recent episode of hypotension and acute lower extremity weakness. Evaluate abscess and possible spinal cord infarct. EXAM: MRI CERVICAL, THORACIC SPINE WITHOUT CONTRAST TECHNIQUE: Multiplanar and multiecho pulse sequences of the cervical spine, to include the craniocervical junction and cervicothoracic junction, and thoracic and lumbar spine, were obtained without intravenous contrast. COMPARISON:  Prior MRI from 03/22/2018. FINDINGS: MRI CERVICAL SPINE FINDINGS Alignment: Examination technically limited as the patient was unable to tolerate the full length of the exam. Additionally, images provided are degraded by motion artifact. Reversal of the normal cervical lordosis with apex at C5, stable. No interval listhesis or malalignment. Vertebrae: Vertebral body height maintained without acute or interval fracture. Bone marrow signal intensity diffusely decreased on T1 weighted imaging, suspected to be related to anemia and chronic disease. No discrete osseous lesions. No abnormal marrow edema. No evidence for interval discitis. Cord: Signal intensity within the cervical spinal cord is within normal limits. No findings to suggest interval cord infarction on this motion degraded and limited exam. Previously identified epidural collection involving the ventral epidural space extending from C2 inferiorly is decreased in size, now measuring up to 7 mm in maximal AP diameter at the level of C2. Collection diffusely involves the ventral epidural  space, but also is seen dorsally as well. Slightly improved diffuse spinal stenosis with thecal sac patency. Posterior Fossa, vertebral arteries, paraspinal tissues: Visualized brain and posterior fossa within normal limits. Craniocervical junction normal. Scattered edema within the posterior paraspinous soft tissues. Normal intravascular flow voids seen within the vertebral arteries bilaterally. Disc levels: C2-C3: Unremarkable. C3-C4: Small left foraminal protrusion with associated moderate left C4 foraminal stenosis. C4-C5:  Unremarkable. C5-C6: Left eccentric disc bulge with uncovertebral hypertrophy. Moderate left C6 foraminal narrowing. C6-C7: Right foraminal disc protrusion with associated moderate right C7 foraminal stenosis. C7-T1:  Unremarkable. MRI THORACIC SPINE FINDINGS Alignment: Examination markedly limited as the patient was unable to tolerate the full length of the exam. Sagittal T1, T2, and STIR sequences only were performed. No axial images obtained. Vertebral bodies normally aligned with preservation of the normal thoracic kyphosis. Vertebrae: Vertebral body height maintained without evidence for interval fracture. Butterfly vertebra noted at T12. Diffusely decreased T1 weighted signal intensity throughout the visualized bone marrow, like related to anemia chronic disease. No findings to suggest interval or new discitis or septic arthritis. Cord: Signal intensity within the thoracic spinal cord grossly within normal limits on these limited sagittal views. No definite cord signal abnormality or cord edema to suggest acute spinal cord infarction. Previously seen diffuse epidural collection appears overall decreased in size from previous, with improved spinal stenosis and thecal sac patency. Paraspinal and other soft tissues: Scattered edema seen within the posterior paraspinous soft tissues at the upper back/cervicothoracic junction. No discrete soft tissue collections. Disc levels: T5-6: Central  disc protrusion indenting upon the ventral thoracic spinal cord, stable. IMPRESSION: 1. Technically limited exam due to motion artifact and the patient's inability to tolerate the full length of the exam. Cervical and thoracic spine only was imaged, and only sagittal sequences of the thoracic spine were obtained. 2. No imaging findings to suggest spinal cord infarction identified on this limited exam. 3. Interval improvement in diffuse epidural collection, decreased in size as compared to previous exam with improved diffuse spinal stenosis and thecal sac patency. No  new discitis or facet arthritis. Electronically Signed   By: Rise Mu M.D.   On: 03/30/2018 22:52   Mr Cervical Spine W Wo Contrast  Result Date: 03/22/2018 CLINICAL DATA:  Spine infection. EXAM: MRI TOTAL SPINE WITHOUT AND WITH CONTRAST TECHNIQUE: Multisequence MR imaging of the spine from the cervical spine to the sacrum was performed prior to and following IV contrast administration. CONTRAST:  6 cc Gadavist intravenous COMPARISON:  CT of the thoracic and lumbar spine from 2 days ago FINDINGS: MRI CERVICAL SPINE FINDINGS Alignment: Normal Vertebrae: No evidence of osseous infection. Canal/Cord: There is extensive spinal fluid collection preferentially in the ventral but also in the right more than left dorsal canal. Subarachnoid space is diffusely effaced. Maximal thickness is posterior to C2 at 9 mm. No cord signal abnormality. Posterior Fossa, vertebral arteries, paraspinal tissues: No retropharyngeal or other discrete soft tissue collection. Disc levels: C2-3: Unremarkable. C3-4: Small left foraminal protrusion with moderate narrowing C4-5: Unremarkable. C5-6: Disc narrowing and bulging with asymmetric left uncovertebral spurring. Left foraminal impingement C6-7: Right foraminal protrusion mild narrowing. C7-T1:Unremarkable. MRI THORACIC SPINE FINDINGS Alignment:  Normal Vertebrae: No evidence of osteomyelitis or discitis. T12  butterfly vertebra. Canal/Cord: Cervical ventral epidural collection continues throughout the thoracic levels. There is also a focal dorsal component at T3-4 to T5-6, where thecal sac effacement is accentuated and there is cord flattening. No cord edema. Paraspinal and other soft tissues: Paraspinous phlegmon on the left at T8-T12, with new complex left pleural effusion and lower lobe atelectasis Disc levels: T5-6 central disc protrusion. MRI LUMBAR SPINE FINDINGS Segmentation:  5 lumbar type vertebral bodies Alignment:  Grade 1 anterolisthesis at L5-S1. Vertebrae: Marrow edema and heterogeneous enhancement within the L2, L3, and L4 spinous processes. No discitis or facet edema. Conus medullaris: Extends to the L1 level and is non edematous. There is extensive epidural collection completely effacing the thecal sac throughout the lumbar spine until L4-5 and below where the collection becomes ventral and right eccentric. The infection communicates with extensive bilateral abscess within the intrinsic back muscles via the interspinous space at L3-4. Patient had recent subcutaneous collection and left buttocks collection drainage, with packing seen in place. This midline, upper subcutaneous collection communicates with the paravertebral abscess along its superior margin based on postcontrast axial images. Paraspinal and other soft tissues: As above.  Distended bladder Disc levels: Chronic bilateral pars defects at L5. Critical Value/emergent results were called by telephone at the time of interpretation on 03/22/2018 at 3:04 pm to Dr. Thedore Mins , who verbally acknowledged these results. IMPRESSION: 1. Epidural abscess from C2 to sacrum as described. Maximal cord compression from T3-4 to T5-6. The thecal sac is completely effaced from L1 to L4-5. At the L3-4 interspinous space the spinal abscess communicates with large bilateral abscesses within the intrinsic back muscles. There is osteomyelitis of the L2, L3, and L4 spinous  processes. No discitis or facet arthritis. 2. Left paravertebral abscess along the lower thoracic spine with small left empyema that is new from CT 2 days ago. 3. Distended bladder Electronically Signed   By: Marnee Spring M.D.   On: 03/22/2018 15:11   Mr Thoracic Spine W Wo Contrast  Result Date: 03/22/2018 CLINICAL DATA:  Spine infection. EXAM: MRI TOTAL SPINE WITHOUT AND WITH CONTRAST TECHNIQUE: Multisequence MR imaging of the spine from the cervical spine to the sacrum was performed prior to and following IV contrast administration. CONTRAST:  6 cc Gadavist intravenous COMPARISON:  CT of the thoracic and lumbar spine  from 2 days ago FINDINGS: MRI CERVICAL SPINE FINDINGS Alignment: Normal Vertebrae: No evidence of osseous infection. Canal/Cord: There is extensive spinal fluid collection preferentially in the ventral but also in the right more than left dorsal canal. Subarachnoid space is diffusely effaced. Maximal thickness is posterior to C2 at 9 mm. No cord signal abnormality. Posterior Fossa, vertebral arteries, paraspinal tissues: No retropharyngeal or other discrete soft tissue collection. Disc levels: C2-3: Unremarkable. C3-4: Small left foraminal protrusion with moderate narrowing C4-5: Unremarkable. C5-6: Disc narrowing and bulging with asymmetric left uncovertebral spurring. Left foraminal impingement C6-7: Right foraminal protrusion mild narrowing. C7-T1:Unremarkable. MRI THORACIC SPINE FINDINGS Alignment:  Normal Vertebrae: No evidence of osteomyelitis or discitis. T12 butterfly vertebra. Canal/Cord: Cervical ventral epidural collection continues throughout the thoracic levels. There is also a focal dorsal component at T3-4 to T5-6, where thecal sac effacement is accentuated and there is cord flattening. No cord edema. Paraspinal and other soft tissues: Paraspinous phlegmon on the left at T8-T12, with new complex left pleural effusion and lower lobe atelectasis Disc levels: T5-6 central disc  protrusion. MRI LUMBAR SPINE FINDINGS Segmentation:  5 lumbar type vertebral bodies Alignment:  Grade 1 anterolisthesis at L5-S1. Vertebrae: Marrow edema and heterogeneous enhancement within the L2, L3, and L4 spinous processes. No discitis or facet edema. Conus medullaris: Extends to the L1 level and is non edematous. There is extensive epidural collection completely effacing the thecal sac throughout the lumbar spine until L4-5 and below where the collection becomes ventral and right eccentric. The infection communicates with extensive bilateral abscess within the intrinsic back muscles via the interspinous space at L3-4. Patient had recent subcutaneous collection and left buttocks collection drainage, with packing seen in place. This midline, upper subcutaneous collection communicates with the paravertebral abscess along its superior margin based on postcontrast axial images. Paraspinal and other soft tissues: As above.  Distended bladder Disc levels: Chronic bilateral pars defects at L5. Critical Value/emergent results were called by telephone at the time of interpretation on 03/22/2018 at 3:04 pm to Dr. Thedore Mins , who verbally acknowledged these results. IMPRESSION: 1. Epidural abscess from C2 to sacrum as described. Maximal cord compression from T3-4 to T5-6. The thecal sac is completely effaced from L1 to L4-5. At the L3-4 interspinous space the spinal abscess communicates with large bilateral abscesses within the intrinsic back muscles. There is osteomyelitis of the L2, L3, and L4 spinous processes. No discitis or facet arthritis. 2. Left paravertebral abscess along the lower thoracic spine with small left empyema that is new from CT 2 days ago. 3. Distended bladder Electronically Signed   By: Marnee Spring M.D.   On: 03/22/2018 15:11   Mr Lumbar Spine W Wo Contrast  Result Date: 03/22/2018 CLINICAL DATA:  Spine infection. EXAM: MRI TOTAL SPINE WITHOUT AND WITH CONTRAST TECHNIQUE: Multisequence MR imaging  of the spine from the cervical spine to the sacrum was performed prior to and following IV contrast administration. CONTRAST:  6 cc Gadavist intravenous COMPARISON:  CT of the thoracic and lumbar spine from 2 days ago FINDINGS: MRI CERVICAL SPINE FINDINGS Alignment: Normal Vertebrae: No evidence of osseous infection. Canal/Cord: There is extensive spinal fluid collection preferentially in the ventral but also in the right more than left dorsal canal. Subarachnoid space is diffusely effaced. Maximal thickness is posterior to C2 at 9 mm. No cord signal abnormality. Posterior Fossa, vertebral arteries, paraspinal tissues: No retropharyngeal or other discrete soft tissue collection. Disc levels: C2-3: Unremarkable. C3-4: Small left foraminal protrusion with moderate narrowing  C4-5: Unremarkable. C5-6: Disc narrowing and bulging with asymmetric left uncovertebral spurring. Left foraminal impingement C6-7: Right foraminal protrusion mild narrowing. C7-T1:Unremarkable. MRI THORACIC SPINE FINDINGS Alignment:  Normal Vertebrae: No evidence of osteomyelitis or discitis. T12 butterfly vertebra. Canal/Cord: Cervical ventral epidural collection continues throughout the thoracic levels. There is also a focal dorsal component at T3-4 to T5-6, where thecal sac effacement is accentuated and there is cord flattening. No cord edema. Paraspinal and other soft tissues: Paraspinous phlegmon on the left at T8-T12, with new complex left pleural effusion and lower lobe atelectasis Disc levels: T5-6 central disc protrusion. MRI LUMBAR SPINE FINDINGS Segmentation:  5 lumbar type vertebral bodies Alignment:  Grade 1 anterolisthesis at L5-S1. Vertebrae: Marrow edema and heterogeneous enhancement within the L2, L3, and L4 spinous processes. No discitis or facet edema. Conus medullaris: Extends to the L1 level and is non edematous. There is extensive epidural collection completely effacing the thecal sac throughout the lumbar spine until L4-5 and  below where the collection becomes ventral and right eccentric. The infection communicates with extensive bilateral abscess within the intrinsic back muscles via the interspinous space at L3-4. Patient had recent subcutaneous collection and left buttocks collection drainage, with packing seen in place. This midline, upper subcutaneous collection communicates with the paravertebral abscess along its superior margin based on postcontrast axial images. Paraspinal and other soft tissues: As above.  Distended bladder Disc levels: Chronic bilateral pars defects at L5. Critical Value/emergent results were called by telephone at the time of interpretation on 03/22/2018 at 3:04 pm to Dr. Thedore Mins , who verbally acknowledged these results. IMPRESSION: 1. Epidural abscess from C2 to sacrum as described. Maximal cord compression from T3-4 to T5-6. The thecal sac is completely effaced from L1 to L4-5. At the L3-4 interspinous space the spinal abscess communicates with large bilateral abscesses within the intrinsic back muscles. There is osteomyelitis of the L2, L3, and L4 spinous processes. No discitis or facet arthritis. 2. Left paravertebral abscess along the lower thoracic spine with small left empyema that is new from CT 2 days ago. 3. Distended bladder Electronically Signed   By: Marnee Spring M.D.   On: 03/22/2018 15:11   Dg Chest Port 1 View  Result Date: 03/30/2018 CLINICAL DATA:  Shortness of breath EXAM: PORTABLE CHEST 1 VIEW COMPARISON:  03/30/2018 at 0227 hours FINDINGS: Moderate layering left pleural effusion, increased. Left lower lobe opacity, atelectasis versus pneumonia. Right lung is clear.  No pneumothorax. The heart is normal in size. IMPRESSION: Moderate layering left pleural effusion, increased. Left lower lobe opacity, atelectasis versus pneumonia. Electronically Signed   By: Charline Bills M.D.   On: 03/30/2018 19:06   Dg Chest Port 1 View  Result Date: 03/30/2018 CLINICAL DATA:  Chest pain EXAM:  PORTABLE CHEST 1 VIEW COMPARISON:  None. FINDINGS: Retrocardiac opacity, atelectasis versus pneumonia. Possible small left pleural effusion. Right lung is clear. No pneumothorax. The heart is normal in size. IMPRESSION: Retrocardiac opacity, atelectasis versus pneumonia. Possible small left pleural effusion. Electronically Signed   By: Charline Bills M.D.   On: 03/30/2018 02:49   [place] Ekg Site Rite  Result Date: 03/21/2018 If Site Rite image not attached, placement could not be confirmed due to current cardiac rhythm.    LOS: 12 days   Signature  Susa Raring M.D on 04/01/2018 at 11:03 AM  To page go to [email or web address] - password [facility]

## 2018-04-01 NOTE — Progress Notes (Signed)
Spoke with Dr Daiva Eves re PICC line, BC negative x 2 days.  Reviewed chart with Dr Daiva Eves.  States will order repeat blood cultures, s/p midline removal.  States ok to place PICC after blood cultures drawn and prior to these results.

## 2018-04-01 NOTE — Progress Notes (Addendum)
Physical Therapy Wound Treatment and Discharge Patient Details  Name: Tony Long MRN: [id number] Date of Birth: March 11, 1976  Today's Date: 04/01/2018 Time: 3557-3220 Time Calculation (min): 32 min  Subjective  Subjective: Pleasant and agreeable to hydrotherapy Patient and Family Stated Goals: Heal wound Prior Treatments: 03/22/18 I&D lumbar wounds  Pain Score: Pain Score: 0-No pain  Wound Assessment  Wound / Incision (Open or Dehisced) 03/23/18 Incision - Open Lumbar Upper (Active)  Dressing Type ABD;Gauze (Comment);Barrier Film (skin prep);Moist to dry 04/01/2018  3:07 PM  Dressing Changed Changed 04/01/2018  3:07 PM  Dressing Status Clean;Dry;Intact 04/01/2018  3:07 PM  Dressing Change Frequency Daily 04/01/2018  3:07 PM  Site / Wound Assessment Red 04/01/2018  3:07 PM  % Wound base Red or Granulating 100% 04/01/2018  3:07 PM  % Wound base Yellow/Fibrinous Exudate 0% 04/01/2018  3:07 PM  % Wound base Black/Eschar 0% 04/01/2018  3:07 PM  % Wound base Other/Granulation Tissue (Comment) 0% 04/01/2018  3:07 PM  Peri-wound Assessment Intact 04/01/2018  3:07 PM  Wound Length (cm) 3 cm 03/30/2018  2:24 PM  Wound Width (cm) 1.7 cm 03/30/2018  2:24 PM  Wound Depth (cm) 1 cm 03/30/2018  2:24 PM  Wound Volume (cm^3) 5.1 cm^3 03/30/2018  2:24 PM  Wound Surface Area (cm^2) 5.1 cm^2 03/30/2018  2:24 PM  Undermining (cm) 3.0 cm at 12:00, 1.8 cm at 2:00, 3.2 cm at 3:00, 3.3 cm from 4:00-8:00, 5.4 cm at 11:00 03/30/2018  2:24 PM  Margins Unattached edges (unapproximated) 04/01/2018  3:07 PM  Closure None 04/01/2018  3:07 PM  Drainage Amount None 04/01/2018  3:07 PM  Drainage Description Purulent 03/30/2018  2:24 PM  Treatment Packing (Saline gauze) 04/01/2018  3:07 PM     Wound / Incision (Open or Dehisced) 03/23/18 Incision - Open Lumbar Lower (Active)  Dressing Type Gauze (Comment);Moist to dry;Barrier Film (skin prep);ABD 04/01/2018  3:07 PM  Dressing Changed Changed 04/01/2018  3:07 PM  Dressing Status  Clean;Intact;Dry 04/01/2018  3:07 PM  Dressing Change Frequency Daily 04/01/2018  3:07 PM  Site / Wound Assessment Dressing in place / Unable to assess 03/31/2018  3:00 PM  % Wound base Red or Granulating 30% 04/01/2018  3:07 PM  % Wound base Yellow/Fibrinous Exudate 0% 04/01/2018  3:07 PM  % Wound base Black/Eschar 0% 04/01/2018  3:07 PM  % Wound base Other/Granulation Tissue (Comment) 70% 04/01/2018  3:07 PM  Peri-wound Assessment Pink 04/01/2018  3:07 PM  Wound Length (cm) 3.7 cm 03/30/2018  2:24 PM  Wound Width (cm) 2 cm 03/30/2018  2:24 PM  Wound Depth (cm) 1.7 cm 03/30/2018  2:24 PM  Wound Volume (cm^3) 12.58 cm^3 03/30/2018  2:24 PM  Wound Surface Area (cm^2) 7.4 cm^2 03/30/2018  2:24 PM  Undermining (cm) 3.4 cm from 1:00-2:00, 3.1 cm at 3:00, 3.9 cm from 3:00-5:00, 2.5 cm at 6:00, 2.8 cm from 6:00-8:00, 4.6 cm at 9:00, 6.1 cm at 11:00, and 1.7 cm at 12:00 03/30/2018  2:24 PM  Margins Unattached edges (unapproximated) 04/01/2018  3:07 PM  Closure None 04/01/2018  3:07 PM  Drainage Amount Minimal 04/01/2018  3:07 PM  Drainage Description Purulent 04/01/2018  3:07 PM  Treatment Debridement (Selective);Hydrotherapy (Pulse lavage);Packing (Saline gauze) 04/01/2018  3:07 PM     Incision (Closed) 03/22/18 Back Left (Active)  Dressing Type ABD;Tape dressing 04/01/2018  8:00 AM  Dressing Clean;Dry;Intact 04/01/2018  8:00 AM  Dressing Change Frequency Other (Comment) 04/01/2018  8:00 AM  Site / Wound Assessment Dressing in  place / Unable to assess 04/01/2018  8:00 AM  Drainage Amount None 03/31/2018  4:00 AM  Drainage Description Serosanguineous 03/27/2018  9:25 AM  Treatment Off loading 03/31/2018  8:00 AM   Hydrotherapy Pulsed lavage therapy - wound location: inferior lumbar wound Pulsed Lavage with Suction (psi): 12 psi Pulsed Lavage with Suction - Normal Saline Used: 1000 mL Pulsed Lavage Tip: Tip with splash shield Selective Debridement Selective Debridement - Location: inferior lumbar wound Selective  Debridement - Tools Used: Forceps;Scissors Selective Debridement - Tissue Removed: yellow necrotic/unviable tissue   Wound Assessment and Plan  Wound Therapy - Assess/Plan/Recommendations Wound Therapy - Clinical Statement: Both superior and inferior wound appear clean and a minimal amount of nonviable tissue was debrided this session from the inferior wound. Feel we are ready to sign off on this patient and transition to wet>dry dressing changes by nursing staff. If surgery feels we need to return and continue hydrotherapy, please reconsult, but at this time will sign off. Wound Therapy - Functional Problem List: Acute pain, decreased tolerance for position changes Factors Delaying/Impairing Wound Healing: Diabetes Mellitus;Infection - systemic/local Hydrotherapy Plan: Debridement;Dressing change;Patient/family education;Pulsatile lavage with suction Wound Therapy - Frequency: 6X / week Wound Therapy - Current Recommendations: PT Wound Therapy - Follow Up Recommendations: Home health RN Wound Plan: See above  Wound Therapy Goals- Improve the function of patient's integumentary system by progressing the wound(s) through the phases of wound healing (inflammation - proliferation - remodeling) by: Decrease Necrotic Tissue to: 0% both wounds Decrease Necrotic Tissue - Progress: Met Increase Granulation Tissue to: 100% both wounds Increase Granulation Tissue - Progress: Met Improve Drainage Characteristics: Min;Serous Improve Drainage Characteristics - Progress: Per MD note, there continues to be purulent drainage exiting through inferior wound, so considering this goal N/A.  Goals/treatment plan/discharge plan were made with and agreed upon by patient/family: Yes Time For Goal Achievement: 7 days Wound Therapy - Potential for Goals: Good  Goals will be updated until maximal potential achieved or discharge criteria met.  Discharge criteria: when goals achieved, discharge from hospital, MD  decision/surgical intervention, no progress towards goals, refusal/missing three consecutive treatments without notification or medical reason.  GP     Thelma Comp 04/01/2018, 3:14 PM   Rolinda Roan, PT, DPT Acute Rehabilitation Services Pager: [phone] Office: [phone]

## 2018-04-01 NOTE — Progress Notes (Signed)
INFECTIOUS DISEASE PROGRESS NOTE  ID: Tony Long is a 42 y.o. male with  Principal Problem:   Opioid use disorder, severe, dependence (HCC) Active Problems:   Non compliance w medication regimen   Chronic hepatitis C without hepatic coma (HCC)   Osteomyelitis (HCC)   Back abscess   MSSA bacteremia   Chest pain on breathing  Subjective: No complaints, up ambulating in room.   Abtx:  Anti-infectives (From admission, onward)   Start     Dose/Rate Route Frequency Ordered Stop   03/31/18 0200  vancomycin (VANCOCIN) IVPB 750 mg/150 ml premix     750 mg 150 mL/hr over 60 Minutes Intravenous Every 8 hours 03/30/18 1628     03/31/18 0100  meropenem (MERREM) 1 g in sodium chloride 0.9 % 100 mL IVPB     1 g 200 mL/hr over 30 Minutes Intravenous Every 8 hours 03/30/18 1628     03/30/18 1630  vancomycin (VANCOCIN) 1,500 mg in sodium chloride 0.9 % 500 mL IVPB     1,500 mg 250 mL/hr over 120 Minutes Intravenous NOW 03/30/18 1619 03/30/18 1925   03/30/18 1630  meropenem (MERREM) 2 g in sodium chloride 0.9 % 100 mL IVPB     2 g 200 mL/hr over 30 Minutes Intravenous NOW 03/30/18 1619 03/30/18 1754   03/21/18 0930  ceFAZolin (ANCEF) IVPB 2g/100 mL premix  Status:  Discontinued     2 g 200 mL/hr over 30 Minutes Intravenous Every 8 hours 03/21/18 0920 03/30/18 1616      Medications:  Scheduled: . aspirin  81 mg Oral Daily  . atorvastatin  40 mg Oral q1800  . buprenorphine-naloxone  2 tablet Sublingual Daily  . gabapentin  300 mg Oral TID  . heparin injection (subcutaneous)  5,000 Units Subcutaneous Q8H  . hydrocortisone sod succinate (SOLU-CORTEF) inj  50 mg Intravenous Q8H  . insulin aspart  0-5 Units Subcutaneous QHS  . insulin aspart  0-9 Units Subcutaneous TID WC  . insulin glargine  25 Units Subcutaneous QHS  . mouth rinse  15 mL Mouth Rinse BID  . metoprolol tartrate  50 mg Oral BID  . nicotine  21 mg Transdermal Daily  . polyethylene glycol  17 g Oral BID  .  senna-docusate  2 tablet Oral QHS  . sodium chloride flush  10-40 mL Intracatheter Q12H  . tamsulosin  0.4 mg Oral Daily    Objective: Vital signs in last 24 hours: Temp:  [98 F (36.7 C)-98.7 F (37.1 C)] 98.2 F (36.8 C) (10/03 0837) Pulse Rate:  [53-105] 53 (10/03 0837) Resp:  [14-28] 14 (10/03 0837) BP: (96-143)/(57-89) 143/88 (10/03 0837) SpO2:  [90 %-93 %] 91 % (10/03 0837)   General appearance: alert, cooperative and no distress Resp: clear to auscultation bilaterally Cardio: regular rate and rhythm GI: normal findings: bowel sounds normal and soft, non-tender  Lab Results Recent Labs    03/30/18 1837 03/31/18 0336 04/01/18 0311  WBC 25.2* 24.8*  --   HGB 7.6* 7.5*  --   HCT 24.3* 23.7*  --   NA  --  135 134*  K  --  3.7 3.5  CL  --  94* 98  CO2  --  31 30  BUN  --  9 8  CREATININE  --  0.62 0.58*   Liver Panel No results for input(s): PROT, ALBUMIN, AST, ALT, ALKPHOS, BILITOT, BILIDIR, IBILI in the last 72 hours. Sedimentation Rate No results for input(s): ESRSEDRATE in the last 72  hours. C-Reactive Protein No results for input(s): CRP in the last 72 hours.  Microbiology: Recent Results (from the past 240 hour(s))  Culture, blood (Routine X 2) w Reflex to ID Panel     Status: None   Collection Time: 03/23/18  4:39 AM  Result Value Ref Range Status   Specimen Description BLOOD RIGHT HAND  Final   Special Requests   Final    BOTTLES DRAWN AEROBIC ONLY Blood Culture adequate volume   Culture   Final    NO GROWTH 5 DAYS Performed at [facility], [address], [place], [place] [address]    Report Status 03/28/2018 FINAL  Final  Culture, blood (single)     Status: None (Preliminary result)   Collection Time: 03/30/18  4:12 PM  Result Value Ref Range Status   Specimen Description BLOOD RIGHT ANTECUBITAL  Final   Special Requests   Final    BOTTLES DRAWN AEROBIC ONLY Blood Culture adequate volume   Culture   Final    NO GROWTH 2  DAYS Performed at [facility], [address] [place], [place] [address]    Report Status PENDING  Incomplete  Culture, Urine     Status: None   Collection Time: 03/30/18  4:20 PM  Result Value Ref Range Status   Specimen Description URINE, CATHETERIZED  Final   Special Requests NONE  Final   Culture   Final    NO GROWTH Performed at [name], [address], [place], [place] [address]    Report Status 04/01/2018 FINAL  Final    Studies/Results: Ct Angio Chest Pe W Or Wo Contrast  Result Date: 03/30/2018 CLINICAL DATA:  Shortness of breath PE suspected, high pretest prob. Patient with known epidural abscess and endocarditis. Now with left-sided chest pain. EXAM: CT ANGIOGRAPHY CHEST WITH CONTRAST TECHNIQUE: Multidetector CT imaging of the chest was performed using the standard protocol during bolus administration of intravenous contrast. Multiplanar CT image reconstructions and MIPs were obtained to evaluate the vascular anatomy. CONTRAST:  54mL ISOVUE-370 IOPAMIDOL (ISOVUE-370) INJECTION 76% COMPARISON:  Chest radiographs earlier this day. FINDINGS: Cardiovascular: There are no filling defects within the pulmonary arteries to suggest pulmonary embolus. The thoracic aorta is normal in caliber without dissection. Mild cardiomegaly. Small amount pericardial fluid. Mediastinum/Nodes: Loculated left pleural effusion tracks along the left aspect of the mediastinum. Small left hilar nodes without bulky adenopathy. No enlarged mediastinal nodes. No dominant thyroid nodule. The esophagus is decompressed. Lungs/Pleura: Moderate left pleural effusion is loculated and tracks along the posterior, lateral, and medial hemithorax. Adjacent airspace disease in the left lower lobe, favoring compressive atelectasis, with adjacent compressive atelectasis in the right upper lobe. Pleural thickening versus loculated pleural effusion the upper medial right hemithorax, with additional areas of pleural  thickening dependently. Dependent atelectasis in the right lower lobe. Trachea and bronchi are patent. Upper Abdomen: No acute findings. Musculoskeletal: Patient with known epidural abscess, not well delineated by CT. No bony destructive change. Review of the MIP images confirms the above findings. IMPRESSION: 1. No pulmonary embolus. 2. Moderate loculated left pleural effusion. Adjacent airspace disease likely compressive atelectasis, pneumonia not excluded in the setting of spinal infection and endocarditis. Mild right pleural thickening or loculated pleural fluid in the upper right hemithorax. Electronically Signed   By: Narda Rutherford M.D.   On: 03/30/2018 23:21   Mr Cervical Spine Wo Contrast  Result Date: 03/30/2018 CLINICAL DATA:  42 year old male with history of MSSA bacteremia  with known endocarditis and epidural abscess extending from C2 through the sacrum. Recent episode of hypotension and acute lower extremity weakness. Evaluate abscess and possible spinal cord infarct. EXAM: MRI CERVICAL, THORACIC SPINE WITHOUT CONTRAST TECHNIQUE: Multiplanar and multiecho pulse sequences of the cervical spine, to include the craniocervical junction and cervicothoracic junction, and thoracic and lumbar spine, were obtained without intravenous contrast. COMPARISON:  Prior MRI from 03/22/2018. FINDINGS: MRI CERVICAL SPINE FINDINGS Alignment: Examination technically limited as the patient was unable to tolerate the full length of the exam. Additionally, images provided are degraded by motion artifact. Reversal of the normal cervical lordosis with apex at C5, stable. No interval listhesis or malalignment. Vertebrae: Vertebral body height maintained without acute or interval fracture. Bone marrow signal intensity diffusely decreased on T1 weighted imaging, suspected to be related to anemia and chronic disease. No discrete osseous lesions. No abnormal marrow edema. No evidence for interval discitis. Cord: Signal  intensity within the cervical spinal cord is within normal limits. No findings to suggest interval cord infarction on this motion degraded and limited exam. Previously identified epidural collection involving the ventral epidural space extending from C2 inferiorly is decreased in size, now measuring up to 7 mm in maximal AP diameter at the level of C2. Collection diffusely involves the ventral epidural space, but also is seen dorsally as well. Slightly improved diffuse spinal stenosis with thecal sac patency. Posterior Fossa, vertebral arteries, paraspinal tissues: Visualized brain and posterior fossa within normal limits. Craniocervical junction normal. Scattered edema within the posterior paraspinous soft tissues. Normal intravascular flow voids seen within the vertebral arteries bilaterally. Disc levels: C2-C3: Unremarkable. C3-C4: Small left foraminal protrusion with associated moderate left C4 foraminal stenosis. C4-C5:  Unremarkable. C5-C6: Left eccentric disc bulge with uncovertebral hypertrophy. Moderate left C6 foraminal narrowing. C6-C7: Right foraminal disc protrusion with associated moderate right C7 foraminal stenosis. C7-T1:  Unremarkable. MRI THORACIC SPINE FINDINGS Alignment: Examination markedly limited as the patient was unable to tolerate the full length of the exam. Sagittal T1, T2, and STIR sequences only were performed. No axial images obtained. Vertebral bodies normally aligned with preservation of the normal thoracic kyphosis. Vertebrae: Vertebral body height maintained without evidence for interval fracture. Butterfly vertebra noted at T12. Diffusely decreased T1 weighted signal intensity throughout the visualized bone marrow, like related to anemia chronic disease. No findings to suggest interval or new discitis or septic arthritis. Cord: Signal intensity within the thoracic spinal cord grossly within normal limits on these limited sagittal views. No definite cord signal abnormality or cord  edema to suggest acute spinal cord infarction. Previously seen diffuse epidural collection appears overall decreased in size from previous, with improved spinal stenosis and thecal sac patency. Paraspinal and other soft tissues: Scattered edema seen within the posterior paraspinous soft tissues at the upper back/cervicothoracic junction. No discrete soft tissue collections. Disc levels: T5-6: Central disc protrusion indenting upon the ventral thoracic spinal cord, stable. IMPRESSION: 1. Technically limited exam due to motion artifact and the patient's inability to tolerate the full length of the exam. Cervical and thoracic spine only was imaged, and only sagittal sequences of the thoracic spine were obtained. 2. No imaging findings to suggest spinal cord infarction identified on this limited exam. 3. Interval improvement in diffuse epidural collection, decreased in size as compared to previous exam with improved diffuse spinal stenosis and thecal sac patency. No new discitis or facet arthritis. Electronically Signed   By: Rise Mu M.D.   On: 03/30/2018 22:52   Mr Thoracic Spine  Wo Contrast  Result Date: 03/30/2018 CLINICAL DATA:  42 year old male with history of MSSA bacteremia with known endocarditis and epidural abscess extending from C2 through the sacrum. Recent episode of hypotension and acute lower extremity weakness. Evaluate abscess and possible spinal cord infarct. EXAM: MRI CERVICAL, THORACIC SPINE WITHOUT CONTRAST TECHNIQUE: Multiplanar and multiecho pulse sequences of the cervical spine, to include the craniocervical junction and cervicothoracic junction, and thoracic and lumbar spine, were obtained without intravenous contrast. COMPARISON:  Prior MRI from 03/22/2018. FINDINGS: MRI CERVICAL SPINE FINDINGS Alignment: Examination technically limited as the patient was unable to tolerate the full length of the exam. Additionally, images provided are degraded by motion artifact. Reversal of  the normal cervical lordosis with apex at C5, stable. No interval listhesis or malalignment. Vertebrae: Vertebral body height maintained without acute or interval fracture. Bone marrow signal intensity diffusely decreased on T1 weighted imaging, suspected to be related to anemia and chronic disease. No discrete osseous lesions. No abnormal marrow edema. No evidence for interval discitis. Cord: Signal intensity within the cervical spinal cord is within normal limits. No findings to suggest interval cord infarction on this motion degraded and limited exam. Previously identified epidural collection involving the ventral epidural space extending from C2 inferiorly is decreased in size, now measuring up to 7 mm in maximal AP diameter at the level of C2. Collection diffusely involves the ventral epidural space, but also is seen dorsally as well. Slightly improved diffuse spinal stenosis with thecal sac patency. Posterior Fossa, vertebral arteries, paraspinal tissues: Visualized brain and posterior fossa within normal limits. Craniocervical junction normal. Scattered edema within the posterior paraspinous soft tissues. Normal intravascular flow voids seen within the vertebral arteries bilaterally. Disc levels: C2-C3: Unremarkable. C3-C4: Small left foraminal protrusion with associated moderate left C4 foraminal stenosis. C4-C5:  Unremarkable. C5-C6: Left eccentric disc bulge with uncovertebral hypertrophy. Moderate left C6 foraminal narrowing. C6-C7: Right foraminal disc protrusion with associated moderate right C7 foraminal stenosis. C7-T1:  Unremarkable. MRI THORACIC SPINE FINDINGS Alignment: Examination markedly limited as the patient was unable to tolerate the full length of the exam. Sagittal T1, T2, and STIR sequences only were performed. No axial images obtained. Vertebral bodies normally aligned with preservation of the normal thoracic kyphosis. Vertebrae: Vertebral body height maintained without evidence for  interval fracture. Butterfly vertebra noted at T12. Diffusely decreased T1 weighted signal intensity throughout the visualized bone marrow, like related to anemia chronic disease. No findings to suggest interval or new discitis or septic arthritis. Cord: Signal intensity within the thoracic spinal cord grossly within normal limits on these limited sagittal views. No definite cord signal abnormality or cord edema to suggest acute spinal cord infarction. Previously seen diffuse epidural collection appears overall decreased in size from previous, with improved spinal stenosis and thecal sac patency. Paraspinal and other soft tissues: Scattered edema seen within the posterior paraspinous soft tissues at the upper back/cervicothoracic junction. No discrete soft tissue collections. Disc levels: T5-6: Central disc protrusion indenting upon the ventral thoracic spinal cord, stable. IMPRESSION: 1. Technically limited exam due to motion artifact and the patient's inability to tolerate the full length of the exam. Cervical and thoracic spine only was imaged, and only sagittal sequences of the thoracic spine were obtained. 2. No imaging findings to suggest spinal cord infarction identified on this limited exam. 3. Interval improvement in diffuse epidural collection, decreased in size as compared to previous exam with improved diffuse spinal stenosis and thecal sac patency. No new discitis or facet arthritis. Electronically Signed  By: Rise Mu M.D.   On: 03/30/2018 22:52   Dg Chest Port 1 View  Result Date: 03/30/2018 CLINICAL DATA:  Shortness of breath EXAM: PORTABLE CHEST 1 VIEW COMPARISON:  03/30/2018 at 0227 hours FINDINGS: Moderate layering left pleural effusion, increased. Left lower lobe opacity, atelectasis versus pneumonia. Right lung is clear.  No pneumothorax. The heart is normal in size. IMPRESSION: Moderate layering left pleural effusion, increased. Left lower lobe opacity, atelectasis versus  pneumonia. Electronically Signed   By: Charline Bills M.D.   On: 03/30/2018 19:06     Assessment/Plan: New L leg weakness C2- Sacrum epidural abscess MSSA bacteremia DM2 Prev IVDA  Total days of antibiotics: 13 11 ancef --> 2 merrem/vanco  Will change him back to more narrow anbx (nafcillin) agree with new PIC Will try to keep him in hospital for 4 weeks to get IV anbx. (04-16-18) After that, would change to keflex 500mg  bid for 4 weeks.  ID available as needed.          Johny Sax MD, FACP Infectious Diseases (pager) [phone] [email or web address] 04/01/2018, 11:13 AM  LOS: 12 days

## 2018-04-02 LAB — COMPREHENSIVE METABOLIC PANEL
ALBUMIN: 1.5 g/dL — AB (ref 3.5–5.0)
ALK PHOS: 140 U/L — AB (ref 38–126)
ALT: 14 U/L (ref 0–44)
AST: 22 U/L (ref 15–41)
Anion gap: 13 (ref 5–15)
BILIRUBIN TOTAL: 0.7 mg/dL (ref 0.3–1.2)
BUN: 15 mg/dL (ref 6–20)
CALCIUM: 8.4 mg/dL — AB (ref 8.9–10.3)
CO2: 30 mmol/L (ref 22–32)
Chloride: 97 mmol/L — ABNORMAL LOW (ref 98–111)
Creatinine, Ser: 0.76 mg/dL (ref 0.61–1.24)
GFR calc Af Amer: >60 mL/min (ref >60)
GFR calc non Af Amer: >60 mL/min (ref >60)
GLUCOSE: 360 mg/dL — AB (ref 70–99)
Potassium: 3.6 mmol/L (ref 3.5–5.1)
Sodium: 140 mmol/L (ref 135–145)
TOTAL PROTEIN: 6.5 g/dL (ref 6.5–8.1)

## 2018-04-02 LAB — CBC
HEMATOCRIT: 28.7 % — AB (ref 39.0–52.0)
HEMOGLOBIN: 9.1 g/dL — AB (ref 13.0–17.0)
MCH: 30.6 pg (ref 26.0–34.0)
MCHC: 31.7 g/dL (ref 30.0–36.0)
MCV: 96.6 fL (ref 78.0–100.0)
Platelets: 485 10*3/uL — ABNORMAL HIGH (ref 150–400)
RBC: 2.97 MIL/uL — AB (ref 4.22–5.81)
RDW: 13.1 % (ref 11.5–15.5)
WBC: 22.2 10*3/uL — AB (ref 4.0–10.5)

## 2018-04-02 LAB — GLUCOSE, CAPILLARY
GLUCOSE-CAPILLARY: 322 mg/dL — AB (ref 70–99)
Glucose-Capillary: 328 mg/dL — ABNORMAL HIGH (ref 70–99)
Glucose-Capillary: 244 mg/dL — ABNORMAL HIGH (ref 70–99)
Glucose-Capillary: 295 mg/dL — ABNORMAL HIGH (ref 70–99)

## 2018-04-02 LAB — MAGNESIUM: Magnesium: 2 mg/dL (ref 1.7–2.4)

## 2018-04-02 MED ORDER — LORAZEPAM 2 MG/ML IJ SOLN
1.0000 mg | Freq: Once | INTRAMUSCULAR | Status: AC
  Administered 2018-04-02: 1 mg via INTRAVENOUS
  Filled 2018-04-02: qty 1

## 2018-04-02 MED ORDER — MORPHINE SULFATE (PF) 4 MG/ML IV SOLN
4.0000 mg | Freq: Once | INTRAVENOUS | Status: DC
  Filled 2018-04-02: qty 1

## 2018-04-02 MED ORDER — LIDOCAINE HCL URETHRAL/MUCOSAL 2 % EX GEL
1.0000 "application " | CUTANEOUS | Status: AC
  Filled 2018-04-02: qty 5

## 2018-04-02 MED ORDER — PRO-STAT SUGAR FREE PO LIQD
30.0000 mL | Freq: Three times a day (TID) | ORAL | Status: DC
  Administered 2018-04-02 – 2018-04-04 (×3): 30 mL via ORAL
  Filled 2018-04-02 (×20): qty 30

## 2018-04-02 MED ORDER — MAGNESIUM SULFATE IN D5W 1-5 GM/100ML-% IV SOLN
1.0000 g | Freq: Once | INTRAVENOUS | Status: AC
  Administered 2018-04-02: 1 g via INTRAVENOUS
  Filled 2018-04-02: qty 100

## 2018-04-02 MED ORDER — POTASSIUM CHLORIDE CRYS ER 20 MEQ PO TBCR
40.0000 meq | EXTENDED_RELEASE_TABLET | Freq: Once | ORAL | Status: AC
  Administered 2018-04-02: 40 meq via ORAL
  Filled 2018-04-02: qty 2

## 2018-04-02 MED ORDER — LIDOCAINE HCL URETHRAL/MUCOSAL 2 % EX GEL
1.0000 "application " | Freq: Once | CUTANEOUS | Status: AC
  Administered 2018-04-02: 1 via URETHRAL
  Filled 2018-04-02 (×2): qty 5

## 2018-04-02 MED ORDER — LIDOCAINE HCL URETHRAL/MUCOSAL 2 % EX GEL: 1.0000 "application " | Freq: Once | CUTANEOUS | Status: DC

## 2018-04-02 NOTE — Progress Notes (Signed)
Follow-up: Notified by RN that pt woke up confused and attempted to pull out his foley. Now there is bleeding into the bag and is no longer draining urine. Requested RN attempt to irrigate the foley but she was unable to do so. Order placed to remove current foley and replace which was also unsuccessful. Bladder scan revealed 609 cc of likely blood and urine. Pt extremely uncomfortable and unable to void. Order placed to have coude catheter placed which was successful but did not drain and RN was unable to irrigate d/t large clot that formed in the tube. So coude catheter removed. Discussed pt w/ Dr Ronne Binning w/ urology service who has requested the "Catheter Team" at ext #[phone] be called and ask to re-insert the coude. He states that if they are unable to do so they will call him. Bedside RN Juanda Chance) made aware of plan.   Tony Chang, NP-C [facility] Pager [phone]

## 2018-04-02 NOTE — Progress Notes (Signed)
   04/02/18 1600  Clinical Encounter Type  Visited With Patient and family together  Visit Type Initial  Spiritual Encounters  Spiritual Needs Other (Comment)  Stress Factors  Family Stress Factors Exhausted   While doing rounds I noticed PT and father inviting me in to room. PT and father were wanting to converse. I offered spiritual care with a listening ear, words of encouragement and Ministry of presence.  Chaplain available as needed.   Chaplain Orest Dikes  [phone]

## 2018-04-02 NOTE — Progress Notes (Signed)
Patient's transient lower extremity weakness has resolved.  Back at baseline.  Continue antibiotics.  No new recommendations.

## 2018-04-02 NOTE — Progress Notes (Signed)
Inpatient Diabetes Program Recommendations  AACE/ADA: New Consensus Statement on Inpatient Glycemic Control (2015)  Target Ranges:  Prepandial:   less than 140 mg/dL      Peak postprandial:   less than 180 mg/dL (1-2 hours)      Critically ill patients:  140 - 180 mg/dL   Lab Results  Component Value Date   GLUCAP 328 (H) 04/02/2018   HGBA1C 12.6 (A) 02/26/2018    Review of Glycemic ControlResults for Tony Long, Tony Long (MRN [id number]) as of 04/02/2018 11:26  Ref. Range 04/01/2018 08:31 04/01/2018 13:09 04/01/2018 17:14 04/01/2018 22:04 04/02/2018 08:04  Glucose-Capillary Latest Ref Range: 70 - 99 mg/dL 952 (H) 841 (H) 324 (H) 442 (H) 328 (H)   Diabetes history: DM Outpatient Diabetes medications: Lantus 50 units q HS, Humalog 10 units tid with meals, Metformin 500 mg bid Current orders for Inpatient glycemic control: Novolog sensitive tid with meals and HS, Lantus 25 units q HS Solu-cortef 100 mg IV q 8 hours Inpatient Diabetes Program Recommendations:   Note that blood sugars increased with steroids. May consider increasing Lantus to 30 units q HS and increase Novolog correction to moderate and to q 4 hours (while on steroids).  As steroids are titrated down, will also need insulin reduced.  Thanks,  Beryl Meager, RN, BC-ADM Inpatient Diabetes Coordinator Pager [phone] (8a-5p)

## 2018-04-02 NOTE — Progress Notes (Signed)
[facility] Progress Note  11 Days Post-Op  Subjective: No complaints.  Sitting up in his chair eating breakfast.  Antibiotics narrowed yesterday to nafcillin per ID.  Repeat blood cultures yesterday NGTD. WBC 22.2 today from 24.8, afebrile.  Objective: Vital signs in last 24 hours: Temp:  [97.9 F (36.6 C)-98.1 F (36.7 C)] 97.9 F (36.6 C) (10/04 0519) Pulse Rate:  [79-110] 79 (10/04 0519) Resp:  [18-20] 18 (10/04 0519) BP: (119-144)/(71-85) 133/77 (10/04 0519) SpO2:  [91 %-93 %] 91 % (10/04 0519) Last BM Date: 03/31/18  Intake/Output from previous day: 10/03 0701 - 10/04 0700 In: 680.1 [P.O.:480; IV Piggyback:200.1] Out: 2200 [Urine:2200] Intake/Output this shift: Total I/O In: 983.5 [IV Piggyback:983.5] Out: 1800 [Urine:1800]  PE: Back: both wounds are clean and look good.  The lower wound still has a small amount of purulent drainage noted, but looks much better.  Lab Results:  Recent Labs    03/31/18 0336 04/02/18 0625  WBC 24.8* 22.2*  HGB 7.5* 9.1*  HCT 23.7* 28.7*  PLT 358 485*   BMET Recent Labs    04/01/18 0311 04/02/18 0625  NA 134* 140  K 3.5 3.6  CL 98 97*  CO2 30 30  GLUCOSE 305* 360*  BUN 8 15  CREATININE 0.58* 0.76  CALCIUM 7.7* 8.4*   PT/INR No results for input(s): LABPROT, INR in the last 72 hours. CMP     Component Value Date/Time   NA 140 04/02/2018 0625   NA 137 10/14/2017 1619   K 3.6 04/02/2018 0625   CL 97 (L) 04/02/2018 0625   CO2 30 04/02/2018 0625   GLUCOSE 360 (H) 04/02/2018 0625   BUN 15 04/02/2018 0625   BUN 9 10/14/2017 1619   CREATININE 0.76 04/02/2018 0625   CREATININE 0.77 12/11/2016 1045   CALCIUM 8.4 (L) 04/02/2018 0625   PROT 6.5 04/02/2018 0625   PROT 8.5 10/14/2017 1619   ALBUMIN 1.5 (L) 04/02/2018 0625   ALBUMIN 4.9 10/14/2017 1619   AST 22 04/02/2018 0625   ALT 14 04/02/2018 0625   ALT BLOOD 03/23/2018 0439   ALKPHOS 140 (H) 04/02/2018 0625   BILITOT 0.7 04/02/2018 0625   BILITOT 0.4 10/14/2017 1619   GFRNONAA >60 04/02/2018 0625   GFRNONAA >89 12/11/2016 1045   GFRAA >60 04/02/2018 0625   GFRAA >89 12/11/2016 1045   Lipase     Component Value Date/Time   LIPASE 15 03/20/2018 0759       Studies/Results: No results found.  Anti-infectives: Anti-infectives (From admission, onward)   Start     Dose/Rate Route Frequency Ordered Stop   04/01/18 1300  nafcillin 2 g in sodium chloride 0.9 % 100 mL IVPB     2 g 200 mL/hr over 30 Minutes Intravenous Every 4 hours 04/01/18 1242     04/01/18 1200  nafcillin injection 2 g  Status:  Discontinued     2 g Intravenous Every 4 hours 04/01/18 1129 04/01/18 1242   03/31/18 0200  vancomycin (VANCOCIN) IVPB 750 mg/150 ml premix  Status:  Discontinued     750 mg 150 mL/hr over 60 Minutes Intravenous Every 8 hours 03/30/18 1628 04/01/18 1129   03/31/18 0100  meropenem (MERREM) 1 g in sodium chloride 0.9 % 100 mL IVPB  Status:  Discontinued     1 g 200 mL/hr over 30 Minutes Intravenous Every 8 hours 03/30/18 1628 04/01/18 1129   03/30/18 1630  vancomycin (VANCOCIN) 1,500 mg in sodium chloride 0.9 % 500 mL IVPB  1,500 mg 250 mL/hr over 120 Minutes Intravenous NOW 03/30/18 1619 03/30/18 1925   03/30/18 1630  meropenem (MERREM) 2 g in sodium chloride 0.9 % 100 mL IVPB     2 g 200 mL/hr over 30 Minutes Intravenous NOW 03/30/18 1619 03/30/18 1754   03/21/18 0930  ceFAZolin (ANCEF) IVPB 2g/100 mL premix  Status:  Discontinued     2 g 200 mL/hr over 30 Minutes Intravenous Every 8 hours 03/21/18 0920 03/30/18 1616       Assessment/Plan T2DM on insulin - A1c 02/26/18 12.6  HTN Hx of IVDA - positive for cocaine and opiates on UDS H/ohepatitisC -MCV Ab >11,HCVRNA negative Code status DNR  MSSA bacteremia- abx per primary team, ID Lumbar abscesses, POD11, s/p I&D9/23 Dr. Janee Morn -ON:GEXBM aureuspan sensitive, anaerobic cx neg - drainage much better.  Hydrotherapy has signed off appropriately so as  the wounds look much better with much less drainage. -ContinueTID dressing changesfor now - Will continue to see once weekly  Epidural abscess from C2-sacrum -per medicine and NS Endocarditis -per medicine  FEN: CM diet VTE: SCDs/heparin ID: ancef 9/21>>10/1, merrem/vancomycin 10/1>>10/3, nafcillin 10/3>>   LOS: 13 days    Franne Forts , [facility] 04/02/2018, 9:41 AM Pager: [phone] Mon 7:00 am -11:30 AM Tues-Fri 7:00 am-4:30 pm Sat-Sun 7:00 am-11:30 am

## 2018-04-02 NOTE — Progress Notes (Addendum)
Pt confused when RN entered around 1 am to hang antibiotic. Pt was looking for his hair and stated he was going to the car while putting on shoes. Pt redirected back to bed.   0122- RN called to patients room because pt stated he wanted his foley out. Once RN entered room pts foley bag contained bloody urine with clots in foley line and pt stated he tried to pull foley out. Pt confused attempting to go to the car unaware of being in the hospital. Pt has thick clots occluding line of foley. Md paged. Orders received to irrigate foley and 1 mg ativan IV for agitation. Will continue to monitor.   0136- unable to irrigate foley meeting resistance. Md paged Orders received to removed foley and replace with new one.   0207- Once foley removed 2 RNs  unable to place new foley. Pt attempted to void without success. Md paged. Orders received to bladder scan (609). Orders received to attempt to place coude catheter.   0315- Coude placed with approximately 100 ml of bloody return with clots. Pt unable to tolerate coude being in place. Screaming and grabbing his penis in pain requesting coude be removed. Coude removed with balloon inflated. MD paged.   Angy.Crafts- MD called and stated Nephrologist MD advised coude be placed by the foley team on 4W.  Supplies ordered spoke with nurse on 4W.  1610- NT walked in pt room and witnessed pt snorting a white substance. When RN asked pt about substance pt stated he was snorting suboxone he stated he gets two pills during the say and that he saved one to snort.  Pt handed RN a card and a empty plastic bag.Then pt continue to say he was just playing and he did not snort any suboxone is orange and not white. Md paged.    9604- Called 4W to follow up with nurse to ensure coude would be placed before end of shift. Nurse relayed message that she was unable to find a second nurse to place foley and stated to call 6N. Information passed to day shift team. [facility] [place] called and stated  they could possibly place foley at beginning of shift and for day team to call back

## 2018-04-02 NOTE — Progress Notes (Addendum)
PROGRESS NOTE        PATIENT DETAILS  Name: Tony Long  Age: 42 y.o.  Sex: male Date of Birth: [birth year] Admit Date: 03/20/2018 Admitting Physician Kendell Bane, MD  ZOX:[id number], Rolm Gala, FNP   Brief Narrative: [name] a 42 y.o. male history of IVDA, DM-2, hypertension admitted for worsening back pain-further evaluation revealed MSSA bacteremia with tricuspid valve endocarditis, epidural abscess involving C2 through sacrum, and numerous soft tissue back abscesses.  Evaluated by general surgery-underwent I&D of her lower back soft tissue abscess, evaluated by neurosurgery-not felt to be a candidate for decompressive surgery due to extensive nature of the disease and lack of any significant neurological findings.  ID following with plans to continue Ancef with stop date of 05/25/2018.  See below for further details  Subjective: Patient in bed, appears comfortable, denies any headache, no fever, no chest pain or pressure, no shortness of breath , no abdominal pain. No focal weakness.   Assessment/Plan:   MSSA Abscess + MSSA Bacteremia with tricuspid valve endocarditis, extensive epidural abscess (C2 through sacrum) and large soft tissue lower back abscess : due to extensive nature of the epidural abscess and lack of concerning neurological findings-neurosurgery does not recommend decompressive surgery.  In by ID and current recommendation is total of 6 weeks of antibiotics with a tentative stop date of 05/25/2018.  Given history of IVDA-not a candidate for outpatient IV antimicrobial therapy. He has received a PICC line, continue IV antibiotics through PEG.   If patient cannot be placed to SNF-he will likely remain inpatient.  Patient aware of the extensive nature of this infection-with life-threatening and life disabling risks including quadriplegia along with life-threatening sepsis with this present infection which is nonsurgical.  This was explained by me  to the patient several times along with his parents bedside on 03/22/2018 and 03/23/2018, 03/30/2018.  Patient was doing well on IV cefazolin since admission till about 03/30/2018 when he had another abrupt run off sepsis with temp of 103, hypotension to 60s requiring several liters of IV fluids and developed some new left-sided lower extremity weakness, ID and neurosurgery and PCCM were called and his antibiotics were broadened from cefazolin to IV vancomycin and meropenem on 03/30/2018.  On 03/31/2018 his sepsis physiology has resolved and he is much better although he has now leg weakness is remarkably improved as well.  Neurosurgery is following.  He has undergone repeat echocardiogram, CT angios chest which were unremarkable, MRI see, T and L-spine are being repeated.  Neurosurgery following.  Prognosis remains poor and again family and patient were updated by me in detail on 03/30/2018.  Remains nonsurgical, now DNR - made by PCCM.  Blood Cultures were repeated on 03/30/2018 which were negative so far, repeat blood cultures were ordered by ID on 04/01/2018 which will be monitored as well.    New onset of left-sided pleuritic chest pain starting on late night 03/29/2018.  Does have lateral T wave inversions and ST depression on EKG, repeat EKG improved, troponin stable he is currently on aspirin, low-dose beta-blocker and statin as tolerated, repeat echocardiogram unremarkable.  Chest pain-free now.  CT chest noted with small pleural effusion which is nonspecific will continue to monitor as symptoms are better.  Hypertension: Monitor on beta-blocker.  Constipation: Had bowel movement overnight-probably secondary to narcotic use-have placed on scheduled MiraLAX and senna.  Acute urinary retention: Required Foley catheter placement on 03/26/2018 along with Flomax, he pulled his Foley catheter by mistake early morning 04/02/2018 and had some hematuria, Foley is being flushed, he has been counseled not to remove  his Foley.  Monitor.  Tried a Foley removal in the next few days.  Cocaine/Subutex abuse: Withdrawal symptoms much better-on 9/25 patient developed excessive sedation-Klonopin/Flexeril has been changed to as needed dosing-clonidine being tapered down.  On 03/28/2018 he requested me that he be put back on his Suboxone, he does not want short-acting pain medications anymore, changes will be made per his request.    Polysubstance abuse/IVDA: Counseled to quit.  Hypokalemia and hypomagnesemia.  Both replaced.  Monitor.  Anemia.  Normocytic, he had some chronic anemia upon admission as well and now worse due to him dilution from IV fluids.  Check anemia panel and monitor no need for transfusion now.    Likely DM-2: Poor outpatient control, continue Lantus and sliding scale.  Lantus dose adjusted for better control.   CBG (last 3)  Recent Labs    04/01/18 1714 04/01/18 2204 04/02/18 0804  GLUCAP 350* 442* 328*   Lab Results  Component Value Date   HGBA1C 12.6 (A) 02/26/2018     DVT Prophylaxis: Prophylactic heparin  Code Status: Full code  Family Communication: Parents multiple times clearly explained prognosis remains poor, now DNR.  They have clear understanding of his medical situation.  Disposition Plan: Briefly moved to ICU on 03/30/2018, now stabilized moved back to stepdown on 03/31/2018 and monitor  Antimicrobial agents: Anti-infectives (From admission, onward)   Start     Dose/Rate Route Frequency Ordered Stop   04/01/18 1300  nafcillin 2 g in sodium chloride 0.9 % 100 mL IVPB     2 g 200 mL/hr over 30 Minutes Intravenous Every 4 hours 04/01/18 1242     04/01/18 1200  nafcillin injection 2 g  Status:  Discontinued     2 g Intravenous Every 4 hours 04/01/18 1129 04/01/18 1242   03/31/18 0200  vancomycin (VANCOCIN) IVPB 750 mg/150 ml premix  Status:  Discontinued     750 mg 150 mL/hr over 60 Minutes Intravenous Every 8 hours 03/30/18 1628 04/01/18 1129   03/31/18 0100   meropenem (MERREM) 1 g in sodium chloride 0.9 % 100 mL IVPB  Status:  Discontinued     1 g 200 mL/hr over 30 Minutes Intravenous Every 8 hours 03/30/18 1628 04/01/18 1129   03/30/18 1630  vancomycin (VANCOCIN) 1,500 mg in sodium chloride 0.9 % 500 mL IVPB     1,500 mg 250 mL/hr over 120 Minutes Intravenous NOW 03/30/18 1619 03/30/18 1925   03/30/18 1630  meropenem (MERREM) 2 g in sodium chloride 0.9 % 100 mL IVPB     2 g 200 mL/hr over 30 Minutes Intravenous NOW 03/30/18 1619 03/30/18 1754   03/21/18 0930  ceFAZolin (ANCEF) IVPB 2g/100 mL premix  Status:  Discontinued     2 g 200 mL/hr over 30 Minutes Intravenous Every 8 hours 03/21/18 0920 03/30/18 1616      Procedures:   MRI C, T and L-spine done 03/30/2018 and 03/31/2018.    CT angiogram chest 03/30/2018.  1. No pulmonary embolus. 2. Moderate loculated left pleural effusion. Adjacent airspace disease likely compressive atelectasis, pneumonia not excluded in the setting of spinal infection and endocarditis. Mild right pleural thickening or loculated pleural fluid in the upper right hemithorax.  Repeat echocardiogram 03/30/2018 - Left ventricle: The cavity size was normal.  Wall thickness was normal. Systolic function was normal. The estimated ejection fraction was in the range of 60% to 65%. Wall motion was normal; there were no regional wall motion abnormalities. Left ventricular diastolic function parameters were normal.  TEE - - Left ventricle: The cavity size was normal. Wall thickness wasnormal. Systolic function was normal. The estimated ejectionfraction was in the range of 60% to 65%. - Aortic valve: No evidence of vegetation. - Mitral valve: No evidence of vegetation. - Left atrium: No evidence of thrombus in the atrial cavity or appendage. No evidence of thrombus in the appendage. - Right atrium: No evidence of thrombus in the atrial cavity orappendage. - Tricuspid valve: There was a vegetation. There was a small (1.2cmx  .6cm), mobile vegetation on the septal leaflet. There was mildregurgitation. - Pulmonic valve: No evidence of vegetation.  Impressions:   Tricuspid valve endocarditis. Discussed with primary team   MRI C-T-L Spine - 1. Epidural abscess from C2 to sacrum as described. Maximal cord compression from T3-4 to T5-6. The thecal sac is completely effaced from L1 to L4-5. At the L3-4 interspinous space the spinal abscess communicates with large bilateral abscesses within the intrinsic back muscles. There is osteomyelitis of the L2, L3, and L4 spinous processes. No discitis or facet arthritis. 2. Left paravertebral abscess along the lower thoracic spine with small left empyema that is new from CT 2 days ago. 3. Distended bladder  CT - 1. Evidence of cellulitis involving the right side of the base of the neck extending into the right supraclavicular region with fluid and gas in the soft tissues at the base of the right side of the neck. 2. No significant abnormality of the thoracic spine. 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  CT -  1. Extensive abnormal fluid collection in the subcutaneous fat of the midline of the back with underlying marked abnormality of the posterior paraspinal musculature from L1-2 through S3. This is worrisome for subcutaneous abscess and myositis. 2. Moth-eaten appearance of the spinous processes of L3 and L4 consistent with osteomyelitis. 3. No discrete epidural abscess. However, the abnormal edema in the paraspinal musculature extends to the posterior aspect of the spinal canal at L3-4. 4. MRI with and without contrast may better define the extent of the soft tissue and infection and could detect epidural extension that is not apparent on this unenhanced CT scan.  9/22>> PICC line  I&D by [facility] of back soft tissue abscess on -  03/22/18    CONSULTS: N.Surgery, ID, general surgery, PCCM  Time spent: 25- minutes-Greater than 50% of this time was spent in counseling,  explanation of diagnosis, planning of further management, and coordination of care.  MEDICATIONS: Scheduled Meds: . aspirin  81 mg Oral Daily  . atorvastatin  40 mg Oral q1800  . buprenorphine-naloxone  2 tablet Sublingual Daily  . gabapentin  300 mg Oral TID  . heparin injection (subcutaneous)  5,000 Units Subcutaneous Q8H  . hydrocortisone sod succinate (SOLU-CORTEF) inj  50 mg Intravenous Q8H  . insulin aspart  0-5 Units Subcutaneous QHS  . insulin aspart  0-9 Units Subcutaneous TID WC  . insulin glargine  25 Units Subcutaneous QHS  . lidocaine  1 application Urethral STAT  . metoprolol tartrate  50 mg Oral BID  . nicotine  21 mg Transdermal Daily  . polyethylene glycol  17 g Oral BID  . senna-docusate  2 tablet Oral QHS  . sodium chloride flush  10-40 mL Intracatheter Q12H  . tamsulosin  0.4 mg Oral Daily   Continuous Infusions: . nafcillin IV 2 g (04/02/18 0832)   PRN Meds:.acetaminophen, bisacodyl, clonazepam, cyclobenzaprine, hydrALAZINE, LORazepam, magnesium hydroxide, metoprolol tartrate, nitroGLYCERIN, sodium phosphate   PHYSICAL EXAM: Vital signs: Vitals:   04/01/18 0837 04/01/18 1311 04/01/18 2205 04/02/18 0519  BP: (!) 143/88 (!) 144/85 119/71 133/77  Pulse: (!) 53 (!) 110 83 79  Resp: 14 20  18   Temp: 98.2 F (36.8 C) 98.1 F (36.7 C) 98 F (36.7 C) 97.9 F (36.6 C)  TempSrc: Oral  Oral Oral  SpO2: 91% 93% 92% 91%  Weight:      Height:       Filed Weights   03/21/18 0602  Weight: 68 kg   Body mass index is 20.92 kg/m.   Exam  Awake Alert, Oriented X 3, No new F.N deficits, Normal affect [place].AT,PERRAL Supple Neck,No JVD, No cervical lymphadenopathy appriciated.  Symmetrical Chest wall movement, Good air movement bilaterally, CTAB RRR,No Gallops, Rubs or new Murmurs, No Parasternal Heave +ve B.Sounds, Abd Soft, No tenderness, No organomegaly appriciated, No rebound - guarding or rigidity. No Cyanosis, Clubbing or edema, No new Rash or  bruise Foley catheter in place, L Leg 4-5/5  I have personally reviewed following labs and imaging studies  LABORATORY DATA: CBC: Recent Labs  Lab 03/30/18 0227 03/30/18 1837 03/31/18 0336 04/02/18 0625  WBC 14.5* 25.2* 24.8* 22.2*  HGB 9.2* 7.6* 7.5* 9.1*  HCT 28.6* 24.3* 23.7* 28.7*  MCV 95.7 98.4 97.1 96.6  PLT 382 365 358 485*    Basic Metabolic Panel: Recent Labs  Lab 03/30/18 0227 03/31/18 0336 04/01/18 0311 04/02/18 0625  NA 135 135 134* 140  K 2.8* 3.7 3.5 3.6  CL 90* 94* 98 97*  CO2 34* 31 30 30   GLUCOSE 230* 266* 305* 360*  BUN 6 9 8 15   CREATININE 0.68 0.62 0.58* 0.76  CALCIUM 8.5* 7.7* 7.7* 8.4*  MG 1.6* 2.0 1.9 2.0  PHOS  --  2.7  --   --     GFR: Estimated Creatinine Clearance: 116.9 mL/min (by C-G formula based on SCr of 0.76 mg/dL).  Liver Function Tests: Recent Labs  Lab 04/02/18 0625  AST 22  ALT 14  ALKPHOS 140*  BILITOT 0.7  PROT 6.5  ALBUMIN 1.5*   No results for input(s): LIPASE, AMYLASE in the last 168 hours. No results for input(s): AMMONIA in the last 168 hours.  Coagulation Profile: No results for input(s): INR, PROTIME in the last 168 hours.  Cardiac Enzymes: Recent Labs  Lab 03/30/18 0219 03/30/18 1222 03/30/18 1620  TROPONINI <0.03 <0.03 <0.03    BNP (last 3 results) No results for input(s): PROBNP in the last 8760 hours.  HbA1C: No results for input(s): HGBA1C in the last 72 hours.  CBG: Recent Labs  Lab 04/01/18 0831 04/01/18 1309 04/01/18 1714 04/01/18 2204 04/02/18 0804  GLUCAP 217* 262* 350* 442* 328*    Lipid Profile: Recent Labs    03/31/18 0336  CHOL 74  HDL 23*  LDLCALC 41  TRIG 48  CHOLHDL 3.2    Thyroid Function Tests: No results for input(s): TSH, T4TOTAL, FREET4, T3FREE, THYROIDAB in the last 72 hours.  Anemia Panel: Recent Labs    04/01/18 1237 04/01/18 1238  VITAMINB12 886  --   FOLATE  --  9.5  FERRITIN 583*  --   TIBC 157*  --   IRON 31*  --   RETICCTPCT 1.9  --      Urine  analysis:    Component Value Date/Time   COLORURINE YELLOW 03/30/2018 1930   APPEARANCEUR CLEAR 03/30/2018 1930   LABSPEC 1.009 03/30/2018 1930   PHURINE 6.0 03/30/2018 1930   GLUCOSEU 50 (A) 03/30/2018 1930   HGBUR SMALL (A) 03/30/2018 1930   BILIRUBINUR NEGATIVE 03/30/2018 1930   BILIRUBINUR small 02/26/2018 1202   KETONESUR NEGATIVE 03/30/2018 1930   PROTEINUR NEGATIVE 03/30/2018 1930   UROBILINOGEN 1.0 02/26/2018 1202   UROBILINOGEN 1.0 12/11/2016 1020   NITRITE NEGATIVE 03/30/2018 1930   LEUKOCYTESUR TRACE (A) 03/30/2018 1930    Sepsis Labs: Lactic Acid, Venous    Component Value Date/Time   LATICACIDVEN 1.2 03/30/2018 1837    MICROBIOLOGY: Recent Results (from the past 240 hour(s))  Culture, blood (single)     Status: None (Preliminary result)   Collection Time: 03/30/18  4:12 PM  Result Value Ref Range Status   Specimen Description BLOOD RIGHT ANTECUBITAL  Final   Special Requests   Final    BOTTLES DRAWN AEROBIC ONLY Blood Culture adequate volume   Culture   Final    NO GROWTH 3 DAYS Performed at [facility], [address] [place], [place] [address]    Report Status PENDING  Incomplete  Culture, Urine     Status: None   Collection Time: 03/30/18  4:20 PM  Result Value Ref Range Status   Specimen Description URINE, CATHETERIZED  Final   Special Requests NONE  Final   Culture   Final    NO GROWTH Performed at [facility], [address] [place], [place] [address]    Report Status 04/01/2018 FINAL  Final  Culture, blood (Routine X 2) w Reflex to ID Panel     Status: None (Preliminary result)   Collection Time: 04/01/18  9:49 AM  Result Value Ref Range Status   Specimen Description BLOOD BLOOD RIGHT HAND  Final   Special Requests   Final    BOTTLES DRAWN AEROBIC ONLY Blood Culture results may not be optimal due to an inadequate volume of blood received in culture bottles   Culture   Final    NO GROWTH < 24 HOURS Performed at  [facility], [address] [place], [place] [address]    Report Status PENDING  Incomplete  Culture, blood (Routine X 2) w Reflex to ID Panel     Status: None (Preliminary result)   Collection Time: 04/01/18 10:02 AM  Result Value Ref Range Status   Specimen Description BLOOD RIGHT ANTECUBITAL  Final   Special Requests   Final    BOTTLES DRAWN AEROBIC ONLY Blood Culture adequate volume   Culture   Final    NO GROWTH < 24 HOURS Performed at [facility], [address], [place], [place] [address]    Report Status PENDING  Incomplete    RADIOLOGY STUDIES/RESULTS: Ct Angio Chest Pe W Or Wo Contrast  Result Date: 03/30/2018 CLINICAL DATA:  Shortness of breath PE suspected, high pretest prob. Patient with known epidural abscess and endocarditis. Now with left-sided chest pain. EXAM: CT ANGIOGRAPHY CHEST WITH CONTRAST TECHNIQUE: Multidetector CT imaging of the chest was performed using the standard protocol during bolus administration of intravenous contrast. Multiplanar CT image reconstructions and MIPs were obtained to evaluate the vascular anatomy. CONTRAST:  54mL ISOVUE-370 IOPAMIDOL (ISOVUE-370) INJECTION 76% COMPARISON:  Chest radiographs earlier this day. FINDINGS: Cardiovascular: There are no filling defects within the pulmonary arteries to suggest pulmonary embolus. The thoracic aorta is  normal in caliber without dissection. Mild cardiomegaly. Small amount pericardial fluid. Mediastinum/Nodes: Loculated left pleural effusion tracks along the left aspect of the mediastinum. Small left hilar nodes without bulky adenopathy. No enlarged mediastinal nodes. No dominant thyroid nodule. The esophagus is decompressed. Lungs/Pleura: Moderate left pleural effusion is loculated and tracks along the posterior, lateral, and medial hemithorax. Adjacent airspace disease in the left lower lobe, favoring compressive atelectasis, with adjacent compressive atelectasis in the right upper lobe.  Pleural thickening versus loculated pleural effusion the upper medial right hemithorax, with additional areas of pleural thickening dependently. Dependent atelectasis in the right lower lobe. Trachea and bronchi are patent. Upper Abdomen: No acute findings. Musculoskeletal: Patient with known epidural abscess, not well delineated by CT. No bony destructive change. Review of the MIP images confirms the above findings. IMPRESSION: 1. No pulmonary embolus. 2. Moderate loculated left pleural effusion. Adjacent airspace disease likely compressive atelectasis, pneumonia not excluded in the setting of spinal infection and endocarditis. Mild right pleural thickening or loculated pleural fluid in the upper right hemithorax. Electronically Signed   By: Narda Rutherford M.D.   On: 03/30/2018 23:21   Ct Thoracic Spine Wo Contrast  Result Date: 03/20/2018 CLINICAL DATA:  Progressive back pain.  Swelling of the lower back. EXAM: CT THORACIC SPINE WITHOUT CONTRAST TECHNIQUE: Multidetector CT images of the thoracic were obtained using the standard protocol without intravenous contrast. COMPARISON:  None. FINDINGS: Alignment: Normal. 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 Paraspinal and other soft tissues: There is abnormal gas and fluid in the soft tissues of the right side of the base of the neck and in the right supraclavicular region. Paraspinal soft tissues appear normal throughout the thoracic spine. Disc levels: There is no evidence of disc protrusion or significant disc bulging or spinal or foraminal stenosis or other significant abnormality of the thoracic spine. IMPRESSION: 1. Evidence of cellulitis involving the right side of the base of the neck extending into the right supraclavicular region with fluid and gas in the soft tissues at the base of the right side of the neck. 2. No significant abnormality of the thoracic spine. 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 Electronically Signed   By: Francene Boyers  M.D.   On: 03/20/2018 11:25   Ct Lumbar Spine Wo Contrast  Addendum Date: 03/20/2018   ADDENDUM REPORT: 03/20/2018 11:44 ADDENDUM: Critical Value/emergent results were called by telephone at the time of interpretation on 03/20/2018 at 11:30 am to Dr. Sharyn Creamer , who verbally acknowledged these results. Electronically Signed   By: Francene Boyers M.D.   On: 03/20/2018 11:44   Result Date: 03/20/2018 CLINICAL DATA:  Increasing low back pain and soft tissue swelling. EXAM: CT LUMBAR SPINE WITHOUT CONTRAST TECHNIQUE: Multidetector CT imaging of the lumbar spine was performed without intravenous contrast administration. Multiplanar CT image reconstructions were also generated. IV contrast could not be utilized due to the lack of an appropriate IV. COMPARISON:  None. FINDINGS: Segmentation: 5 lumbar type vertebrae. Alignment: Normal. Vertebrae: There is a moth-eaten appearance of the spinous processes of L3 and L4 which is worrisome for osteomyelitis. Bilateral pars defects at L5 with grade 1 spondylolisthesis. 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 Paraspinal and other soft tissues: There is an extensive abnormal fluid collection in the subcutaneous soft tissues of the posterior aspect of the back extending from approximately L1-2 to S3. This fluid collection is lobulated and measures approximately 20 x 9 x 2.5 cm. It is centered slightly to the left of midline and has a  mass effect upon the adjacent posterior paraspinal muscles. There is abnormal lucency in the underlying paraspinal muscles which could represent myositis. Disc levels: T11-12: No significant abnormality. Butterfly T12 vertebra. T12-L1: No significant abnormality. L1-2: Normal disc. Abnormal edema in the posterior paraspinal musculature with adjacent fluid collection in the subcutaneous fat of the posterior aspect of the back as described above. L2-3: Normal disc. L3-4: Normal disc. Lucency in the posterior paraspinal soft tissues extends to the  posterior aspect of the thecal sac on image 80 of series 4 but there is no discrete epidural abscess. L4-5: Normal disc.  No evidence of epidural abscess. L5-S1: Grade 1 spondylolisthesis. No disc bulging or protrusion. Bilateral pars defects. No visible epidural abscess. IMPRESSION: 1. Extensive abnormal fluid collection in the subcutaneous fat of the midline of the back with underlying marked abnormality of the posterior paraspinal musculature from L1-2 through S3. This is worrisome for subcutaneous abscess and myositis. 2. Moth-eaten appearance of the spinous processes of L3 and L4 consistent with osteomyelitis. 3. No discrete epidural abscess. However, the abnormal edema in the paraspinal musculature extends to the posterior aspect of the spinal canal at L3-4. 4. MRI with and without contrast may better define the extent of the soft tissue and infection and could detect epidural extension that is not apparent on this unenhanced CT scan. Electronically Signed: By: Francene Boyers M.D. On: 03/20/2018 11:18   Mr Cervical Spine Wo Contrast  Result Date: 03/30/2018 CLINICAL DATA:  42 year old male with history of MSSA bacteremia with known endocarditis and epidural abscess extending from C2 through the sacrum. Recent episode of hypotension and acute lower extremity weakness. Evaluate abscess and possible spinal cord infarct. EXAM: MRI CERVICAL, THORACIC SPINE WITHOUT CONTRAST TECHNIQUE: Multiplanar and multiecho pulse sequences of the cervical spine, to include the craniocervical junction and cervicothoracic junction, and thoracic and lumbar spine, were obtained without intravenous contrast. COMPARISON:  Prior MRI from 03/22/2018. FINDINGS: MRI CERVICAL SPINE FINDINGS Alignment: Examination technically limited as the patient was unable to tolerate the full length of the exam. Additionally, images provided are degraded by motion artifact. Reversal of the normal cervical lordosis with apex at C5, stable. No interval  listhesis or malalignment. Vertebrae: Vertebral body height maintained without acute or interval fracture. Bone marrow signal intensity diffusely decreased on T1 weighted imaging, suspected to be related to anemia and chronic disease. No discrete osseous lesions. No abnormal marrow edema. No evidence for interval discitis. Cord: Signal intensity within the cervical spinal cord is within normal limits. No findings to suggest interval cord infarction on this motion degraded and limited exam. Previously identified epidural collection involving the ventral epidural space extending from C2 inferiorly is decreased in size, now measuring up to 7 mm in maximal AP diameter at the level of C2. Collection diffusely involves the ventral epidural space, but also is seen dorsally as well. Slightly improved diffuse spinal stenosis with thecal sac patency. Posterior Fossa, vertebral arteries, paraspinal tissues: Visualized brain and posterior fossa within normal limits. Craniocervical junction normal. Scattered edema within the posterior paraspinous soft tissues. Normal intravascular flow voids seen within the vertebral arteries bilaterally. Disc levels: C2-C3: Unremarkable. C3-C4: Small left foraminal protrusion with associated moderate left C4 foraminal stenosis. C4-C5:  Unremarkable. C5-C6: Left eccentric disc bulge with uncovertebral hypertrophy. Moderate left C6 foraminal narrowing. C6-C7: Right foraminal disc protrusion with associated moderate right C7 foraminal stenosis. C7-T1:  Unremarkable. MRI THORACIC SPINE FINDINGS Alignment: Examination markedly limited as the patient was unable to tolerate the full length of  the exam. Sagittal T1, T2, and STIR sequences only were performed. No axial images obtained. Vertebral bodies normally aligned with preservation of the normal thoracic kyphosis. Vertebrae: Vertebral body height maintained without evidence for interval fracture. Butterfly vertebra noted at T12. Diffusely decreased  T1 weighted signal intensity throughout the visualized bone marrow, like related to anemia chronic disease. No findings to suggest interval or new discitis or septic arthritis. Cord: Signal intensity within the thoracic spinal cord grossly within normal limits on these limited sagittal views. No definite cord signal abnormality or cord edema to suggest acute spinal cord infarction. Previously seen diffuse epidural collection appears overall decreased in size from previous, with improved spinal stenosis and thecal sac patency. Paraspinal and other soft tissues: Scattered edema seen within the posterior paraspinous soft tissues at the upper back/cervicothoracic junction. No discrete soft tissue collections. Disc levels: T5-6: Central disc protrusion indenting upon the ventral thoracic spinal cord, stable. IMPRESSION: 1. Technically limited exam due to motion artifact and the patient's inability to tolerate the full length of the exam. Cervical and thoracic spine only was imaged, and only sagittal sequences of the thoracic spine were obtained. 2. No imaging findings to suggest spinal cord infarction identified on this limited exam. 3. Interval improvement in diffuse epidural collection, decreased in size as compared to previous exam with improved diffuse spinal stenosis and thecal sac patency. No new discitis or facet arthritis. Electronically Signed   By: Rise Mu M.D.   On: 03/30/2018 22:52   Mr Thoracic Spine Wo Contrast  Result Date: 03/30/2018 CLINICAL DATA:  42 year old male with history of MSSA bacteremia with known endocarditis and epidural abscess extending from C2 through the sacrum. Recent episode of hypotension and acute lower extremity weakness. Evaluate abscess and possible spinal cord infarct. EXAM: MRI CERVICAL, THORACIC SPINE WITHOUT CONTRAST TECHNIQUE: Multiplanar and multiecho pulse sequences of the cervical spine, to include the craniocervical junction and cervicothoracic junction,  and thoracic and lumbar spine, were obtained without intravenous contrast. COMPARISON:  Prior MRI from 03/22/2018. FINDINGS: MRI CERVICAL SPINE FINDINGS Alignment: Examination technically limited as the patient was unable to tolerate the full length of the exam. Additionally, images provided are degraded by motion artifact. Reversal of the normal cervical lordosis with apex at C5, stable. No interval listhesis or malalignment. Vertebrae: Vertebral body height maintained without acute or interval fracture. Bone marrow signal intensity diffusely decreased on T1 weighted imaging, suspected to be related to anemia and chronic disease. No discrete osseous lesions. No abnormal marrow edema. No evidence for interval discitis. Cord: Signal intensity within the cervical spinal cord is within normal limits. No findings to suggest interval cord infarction on this motion degraded and limited exam. Previously identified epidural collection involving the ventral epidural space extending from C2 inferiorly is decreased in size, now measuring up to 7 mm in maximal AP diameter at the level of C2. Collection diffusely involves the ventral epidural space, but also is seen dorsally as well. Slightly improved diffuse spinal stenosis with thecal sac patency. Posterior Fossa, vertebral arteries, paraspinal tissues: Visualized brain and posterior fossa within normal limits. Craniocervical junction normal. Scattered edema within the posterior paraspinous soft tissues. Normal intravascular flow voids seen within the vertebral arteries bilaterally. Disc levels: C2-C3: Unremarkable. C3-C4: Small left foraminal protrusion with associated moderate left C4 foraminal stenosis. C4-C5:  Unremarkable. C5-C6: Left eccentric disc bulge with uncovertebral hypertrophy. Moderate left C6 foraminal narrowing. C6-C7: Right foraminal disc protrusion with associated moderate right C7 foraminal stenosis. C7-T1:  Unremarkable. MRI THORACIC SPINE  FINDINGS  Alignment: Examination markedly limited as the patient was unable to tolerate the full length of the exam. Sagittal T1, T2, and STIR sequences only were performed. No axial images obtained. Vertebral bodies normally aligned with preservation of the normal thoracic kyphosis. Vertebrae: Vertebral body height maintained without evidence for interval fracture. Butterfly vertebra noted at T12. Diffusely decreased T1 weighted signal intensity throughout the visualized bone marrow, like related to anemia chronic disease. No findings to suggest interval or new discitis or septic arthritis. Cord: Signal intensity within the thoracic spinal cord grossly within normal limits on these limited sagittal views. No definite cord signal abnormality or cord edema to suggest acute spinal cord infarction. Previously seen diffuse epidural collection appears overall decreased in size from previous, with improved spinal stenosis and thecal sac patency. Paraspinal and other soft tissues: Scattered edema seen within the posterior paraspinous soft tissues at the upper back/cervicothoracic junction. No discrete soft tissue collections. Disc levels: T5-6: Central disc protrusion indenting upon the ventral thoracic spinal cord, stable. IMPRESSION: 1. Technically limited exam due to motion artifact and the patient's inability to tolerate the full length of the exam. Cervical and thoracic spine only was imaged, and only sagittal sequences of the thoracic spine were obtained. 2. No imaging findings to suggest spinal cord infarction identified on this limited exam. 3. Interval improvement in diffuse epidural collection, decreased in size as compared to previous exam with improved diffuse spinal stenosis and thecal sac patency. No new discitis or facet arthritis. Electronically Signed   By: Rise Mu M.D.   On: 03/30/2018 22:52   Mr Cervical Spine W Wo Contrast  Result Date: 03/22/2018 CLINICAL DATA:  Spine infection. EXAM: MRI TOTAL  SPINE WITHOUT AND WITH CONTRAST TECHNIQUE: Multisequence MR imaging of the spine from the cervical spine to the sacrum was performed prior to and following IV contrast administration. CONTRAST:  6 cc Gadavist intravenous COMPARISON:  CT of the thoracic and lumbar spine from 2 days ago FINDINGS: MRI CERVICAL SPINE FINDINGS Alignment: Normal Vertebrae: No evidence of osseous infection. Canal/Cord: There is extensive spinal fluid collection preferentially in the ventral but also in the right more than left dorsal canal. Subarachnoid space is diffusely effaced. Maximal thickness is posterior to C2 at 9 mm. No cord signal abnormality. Posterior Fossa, vertebral arteries, paraspinal tissues: No retropharyngeal or other discrete soft tissue collection. Disc levels: C2-3: Unremarkable. C3-4: Small left foraminal protrusion with moderate narrowing C4-5: Unremarkable. C5-6: Disc narrowing and bulging with asymmetric left uncovertebral spurring. Left foraminal impingement C6-7: Right foraminal protrusion mild narrowing. C7-T1:Unremarkable. MRI THORACIC SPINE FINDINGS Alignment:  Normal Vertebrae: No evidence of osteomyelitis or discitis. T12 butterfly vertebra. Canal/Cord: Cervical ventral epidural collection continues throughout the thoracic levels. There is also a focal dorsal component at T3-4 to T5-6, where thecal sac effacement is accentuated and there is cord flattening. No cord edema. Paraspinal and other soft tissues: Paraspinous phlegmon on the left at T8-T12, with new complex left pleural effusion and lower lobe atelectasis Disc levels: T5-6 central disc protrusion. MRI LUMBAR SPINE FINDINGS Segmentation:  5 lumbar type vertebral bodies Alignment:  Grade 1 anterolisthesis at L5-S1. Vertebrae: Marrow edema and heterogeneous enhancement within the L2, L3, and L4 spinous processes. No discitis or facet edema. Conus medullaris: Extends to the L1 level and is non edematous. There is extensive epidural collection  completely effacing the thecal sac throughout the lumbar spine until L4-5 and below where the collection becomes ventral and right eccentric. The infection communicates with extensive bilateral  abscess within the intrinsic back muscles via the interspinous space at L3-4. Patient had recent subcutaneous collection and left buttocks collection drainage, with packing seen in place. This midline, upper subcutaneous collection communicates with the paravertebral abscess along its superior margin based on postcontrast axial images. Paraspinal and other soft tissues: As above.  Distended bladder Disc levels: Chronic bilateral pars defects at L5. Critical Value/emergent results were called by telephone at the time of interpretation on 03/22/2018 at 3:04 pm to Dr. Thedore Mins , who verbally acknowledged these results. IMPRESSION: 1. Epidural abscess from C2 to sacrum as described. Maximal cord compression from T3-4 to T5-6. The thecal sac is completely effaced from L1 to L4-5. At the L3-4 interspinous space the spinal abscess communicates with large bilateral abscesses within the intrinsic back muscles. There is osteomyelitis of the L2, L3, and L4 spinous processes. No discitis or facet arthritis. 2. Left paravertebral abscess along the lower thoracic spine with small left empyema that is new from CT 2 days ago. 3. Distended bladder Electronically Signed   By: Marnee Spring M.D.   On: 03/22/2018 15:11   Mr Thoracic Spine W Wo Contrast  Result Date: 03/22/2018 CLINICAL DATA:  Spine infection. EXAM: MRI TOTAL SPINE WITHOUT AND WITH CONTRAST TECHNIQUE: Multisequence MR imaging of the spine from the cervical spine to the sacrum was performed prior to and following IV contrast administration. CONTRAST:  6 cc Gadavist intravenous COMPARISON:  CT of the thoracic and lumbar spine from 2 days ago FINDINGS: MRI CERVICAL SPINE FINDINGS Alignment: Normal Vertebrae: No evidence of osseous infection. Canal/Cord: There is extensive spinal  fluid collection preferentially in the ventral but also in the right more than left dorsal canal. Subarachnoid space is diffusely effaced. Maximal thickness is posterior to C2 at 9 mm. No cord signal abnormality. Posterior Fossa, vertebral arteries, paraspinal tissues: No retropharyngeal or other discrete soft tissue collection. Disc levels: C2-3: Unremarkable. C3-4: Small left foraminal protrusion with moderate narrowing C4-5: Unremarkable. C5-6: Disc narrowing and bulging with asymmetric left uncovertebral spurring. Left foraminal impingement C6-7: Right foraminal protrusion mild narrowing. C7-T1:Unremarkable. MRI THORACIC SPINE FINDINGS Alignment:  Normal Vertebrae: No evidence of osteomyelitis or discitis. T12 butterfly vertebra. Canal/Cord: Cervical ventral epidural collection continues throughout the thoracic levels. There is also a focal dorsal component at T3-4 to T5-6, where thecal sac effacement is accentuated and there is cord flattening. No cord edema. Paraspinal and other soft tissues: Paraspinous phlegmon on the left at T8-T12, with new complex left pleural effusion and lower lobe atelectasis Disc levels: T5-6 central disc protrusion. MRI LUMBAR SPINE FINDINGS Segmentation:  5 lumbar type vertebral bodies Alignment:  Grade 1 anterolisthesis at L5-S1. Vertebrae: Marrow edema and heterogeneous enhancement within the L2, L3, and L4 spinous processes. No discitis or facet edema. Conus medullaris: Extends to the L1 level and is non edematous. There is extensive epidural collection completely effacing the thecal sac throughout the lumbar spine until L4-5 and below where the collection becomes ventral and right eccentric. The infection communicates with extensive bilateral abscess within the intrinsic back muscles via the interspinous space at L3-4. Patient had recent subcutaneous collection and left buttocks collection drainage, with packing seen in place. This midline, upper subcutaneous collection  communicates with the paravertebral abscess along its superior margin based on postcontrast axial images. Paraspinal and other soft tissues: As above.  Distended bladder Disc levels: Chronic bilateral pars defects at L5. Critical Value/emergent results were called by telephone at the time of interpretation on 03/22/2018 at 3:04 pm to  Dr. Thedore Mins , who verbally acknowledged these results. IMPRESSION: 1. Epidural abscess from C2 to sacrum as described. Maximal cord compression from T3-4 to T5-6. The thecal sac is completely effaced from L1 to L4-5. At the L3-4 interspinous space the spinal abscess communicates with large bilateral abscesses within the intrinsic back muscles. There is osteomyelitis of the L2, L3, and L4 spinous processes. No discitis or facet arthritis. 2. Left paravertebral abscess along the lower thoracic spine with small left empyema that is new from CT 2 days ago. 3. Distended bladder Electronically Signed   By: Marnee Spring M.D.   On: 03/22/2018 15:11   Mr Lumbar Spine W Wo Contrast  Result Date: 03/22/2018 CLINICAL DATA:  Spine infection. EXAM: MRI TOTAL SPINE WITHOUT AND WITH CONTRAST TECHNIQUE: Multisequence MR imaging of the spine from the cervical spine to the sacrum was performed prior to and following IV contrast administration. CONTRAST:  6 cc Gadavist intravenous COMPARISON:  CT of the thoracic and lumbar spine from 2 days ago FINDINGS: MRI CERVICAL SPINE FINDINGS Alignment: Normal Vertebrae: No evidence of osseous infection. Canal/Cord: There is extensive spinal fluid collection preferentially in the ventral but also in the right more than left dorsal canal. Subarachnoid space is diffusely effaced. Maximal thickness is posterior to C2 at 9 mm. No cord signal abnormality. Posterior Fossa, vertebral arteries, paraspinal tissues: No retropharyngeal or other discrete soft tissue collection. Disc levels: C2-3: Unremarkable. C3-4: Small left foraminal protrusion with moderate narrowing  C4-5: Unremarkable. C5-6: Disc narrowing and bulging with asymmetric left uncovertebral spurring. Left foraminal impingement C6-7: Right foraminal protrusion mild narrowing. C7-T1:Unremarkable. MRI THORACIC SPINE FINDINGS Alignment:  Normal Vertebrae: No evidence of osteomyelitis or discitis. T12 butterfly vertebra. Canal/Cord: Cervical ventral epidural collection continues throughout the thoracic levels. There is also a focal dorsal component at T3-4 to T5-6, where thecal sac effacement is accentuated and there is cord flattening. No cord edema. Paraspinal and other soft tissues: Paraspinous phlegmon on the left at T8-T12, with new complex left pleural effusion and lower lobe atelectasis Disc levels: T5-6 central disc protrusion. MRI LUMBAR SPINE FINDINGS Segmentation:  5 lumbar type vertebral bodies Alignment:  Grade 1 anterolisthesis at L5-S1. Vertebrae: Marrow edema and heterogeneous enhancement within the L2, L3, and L4 spinous processes. No discitis or facet edema. Conus medullaris: Extends to the L1 level and is non edematous. There is extensive epidural collection completely effacing the thecal sac throughout the lumbar spine until L4-5 and below where the collection becomes ventral and right eccentric. The infection communicates with extensive bilateral abscess within the intrinsic back muscles via the interspinous space at L3-4. Patient had recent subcutaneous collection and left buttocks collection drainage, with packing seen in place. This midline, upper subcutaneous collection communicates with the paravertebral abscess along its superior margin based on postcontrast axial images. Paraspinal and other soft tissues: As above.  Distended bladder Disc levels: Chronic bilateral pars defects at L5. Critical Value/emergent results were called by telephone at the time of interpretation on 03/22/2018 at 3:04 pm to Dr. Thedore Mins , who verbally acknowledged these results. IMPRESSION: 1. Epidural abscess from C2 to  sacrum as described. Maximal cord compression from T3-4 to T5-6. The thecal sac is completely effaced from L1 to L4-5. At the L3-4 interspinous space the spinal abscess communicates with large bilateral abscesses within the intrinsic back muscles. There is osteomyelitis of the L2, L3, and L4 spinous processes. No discitis or facet arthritis. 2. Left paravertebral abscess along the lower thoracic spine with small left empyema  that is new from CT 2 days ago. 3. Distended bladder Electronically Signed   By: Marnee Spring M.D.   On: 03/22/2018 15:11   Dg Chest Port 1 View  Result Date: 03/30/2018 CLINICAL DATA:  Shortness of breath EXAM: PORTABLE CHEST 1 VIEW COMPARISON:  03/30/2018 at 0227 hours FINDINGS: Moderate layering left pleural effusion, increased. Left lower lobe opacity, atelectasis versus pneumonia. Right lung is clear.  No pneumothorax. The heart is normal in size. IMPRESSION: Moderate layering left pleural effusion, increased. Left lower lobe opacity, atelectasis versus pneumonia. Electronically Signed   By: Charline Bills M.D.   On: 03/30/2018 19:06   Dg Chest Port 1 View  Result Date: 03/30/2018 CLINICAL DATA:  Chest pain EXAM: PORTABLE CHEST 1 VIEW COMPARISON:  None. FINDINGS: Retrocardiac opacity, atelectasis versus pneumonia. Possible small left pleural effusion. Right lung is clear. No pneumothorax. The heart is normal in size. IMPRESSION: Retrocardiac opacity, atelectasis versus pneumonia. Possible small left pleural effusion. Electronically Signed   By: Charline Bills M.D.   On: 03/30/2018 02:49   [place] Ekg Site Rite  Result Date: 03/21/2018 If Site Rite image not attached, placement could not be confirmed due to current cardiac rhythm.    LOS: 13 days   Signature  Susa Raring M.D on 04/02/2018 at 11:44 AM  To page go to [email or web address] - password [email or web address]

## 2018-04-03 LAB — GLUCOSE, CAPILLARY
GLUCOSE-CAPILLARY: 416 mg/dL — AB (ref 70–99)
GLUCOSE-CAPILLARY: 271 mg/dL — AB (ref 70–99)
Glucose-Capillary: 475 mg/dL — ABNORMAL HIGH (ref 70–99)
Glucose-Capillary: 305 mg/dL — ABNORMAL HIGH (ref 70–99)

## 2018-04-03 MED ORDER — HYDROCORTISONE NA SUCCINATE PF 100 MG IJ SOLR
50.0000 mg | Freq: Every day | INTRAMUSCULAR | Status: DC
  Administered 2018-04-03: 50 mg via INTRAVENOUS
  Filled 2018-04-03: qty 2

## 2018-04-03 MED ORDER — POTASSIUM CHLORIDE CRYS ER 20 MEQ PO TBCR
40.0000 meq | EXTENDED_RELEASE_TABLET | Freq: Once | ORAL | Status: AC
  Administered 2018-04-03: 40 meq via ORAL
  Filled 2018-04-03: qty 2

## 2018-04-03 MED ORDER — FUROSEMIDE 40 MG PO TABS
40.0000 mg | ORAL_TABLET | Freq: Once | ORAL | Status: AC
  Administered 2018-04-03: 40 mg via ORAL
  Filled 2018-04-03: qty 1

## 2018-04-03 MED ORDER — INSULIN ASPART 100 UNIT/ML ~~LOC~~ SOLN
4.0000 [IU] | Freq: Three times a day (TID) | SUBCUTANEOUS | Status: DC
  Administered 2018-04-03 – 2018-04-17 (×42): 4 [IU] via SUBCUTANEOUS

## 2018-04-03 NOTE — Progress Notes (Signed)
PROGRESS NOTE        PATIENT DETAILS  Name: Tony Long  Age: 42 y.o.  Sex: male Date of Birth: October 12, 1975 Admit Date: 03/20/2018 Admitting Physician Kendell Bane, MD  VHQ:IONGEX, Rolm Gala, FNP   Brief Narrative: [name] a 42 y.o. male history of IVDA, DM-2, hypertension admitted for worsening back pain-further evaluation revealed MSSA bacteremia with tricuspid valve endocarditis, epidural abscess involving C2 through sacrum, and numerous soft tissue back abscesses.  Evaluated by general surgery-underwent I&D of her lower back soft tissue abscess, evaluated by neurosurgery-not felt to be a candidate for decompressive surgery due to extensive nature of the disease and lack of any significant neurological findings.  ID following with plans to continue Ancef with stop date of 05/25/2018.  See below for further details  Subjective: Patient sitting up in recliner, appears comfortable, denies any headache, no fever, no chest pain or pressure, no shortness of breath , no abdominal pain. No focal weakness.  Assessment/Plan:   MSSA Abscess with tricuspid valve endocarditis, extensive epidural abscess (C2 through sacrum) and large soft tissue lower back abscess Blood cultures negative: due to extensive nature of the epidural abscess and lack of concerning neurological findings-neurosurgery does not recommend decompressive surgery.  In by ID and current recommendation is total of 6 weeks of antibiotics with a tentative stop date of 05/25/2018.  Given history of IVDA-not a candidate for outpatient IV antimicrobial therapy. He has received a PICC line, continue IV antibiotics through PEG.   If patient cannot be placed to SNF-he will likely remain inpatient.  Patient aware of the extensive nature of this infection-with life-threatening and life disabling risks including quadriplegia along with life-threatening sepsis with this present infection which is nonsurgical.  This  was explained by me to the patient several times along with his parents bedside on 03/22/2018 and 03/23/2018, 03/30/2018.  Patient was doing well on IV cefazolin since admission till about 03/30/2018 when he had another abrupt run off sepsis with temp of 103, hypotension to 60s requiring several liters of IV fluids and developed some new left-sided lower extremity weakness, ID and neurosurgery and PCCM were called and his antibiotics were broadened from cefazolin to IV vancomycin and meropenem on 03/30/2018.  On 03/31/2018 his sepsis physiology has resolved and he is much better although he has now leg weakness is remarkably improved as well.  Neurosurgery is following.  He has undergone repeat echocardiogram, CT angios chest which were unremarkable, MRI see, T and L-spine are being repeated.  Neurosurgery following.  Prognosis remains poor and again family and patient were updated by me in detail on 03/30/2018.  Remains nonsurgical, now DNR - made by PCCM.  Blood Cultures were repeated on 03/30/2018 which were negative so far, repeat blood cultures were ordered by ID on 04/01/2018 which will be monitored as well.  Cocaine/Subutex abuse: Withdrawal symptoms much better-on 9/25 patient developed excessive sedation-Klonopin/Flexeril has been changed to as needed dosing-clonidine being tapered down.  On 03/28/2018 he requested me that he be put back on his Suboxone, he does not want short-acting pain medications anymore, changes will be made per his request.    Polysubstance abuse/IVDA: Counseled to quit.  Anemia.  Normocytic, he had some chronic anemia upon admission as well and now worse due to him dilution from IV fluids.  Also some indices are low and could have  some element of iron deficiency as well due to multiple blood draws etc., will place on oral iron for now thereafter outpatient PCP and GI work-up as needed.  Mild lower extremity leg edema.  Likely due to multiple liters of fluid given while he was septic,  TED stockings and gentle Lasix on 04/03/2018 and monitor.    New onset of left-sided pleuritic chest pain starting on late night 03/29/2018.  Nonspecific EKG changes stable on combination of aspirin, beta-blocker and statin.  Echocardiogram shows no wall motion abnormality and preserved EF, pain is completely resolved.  Hypertension: Monitor on beta-blocker.  Constipation: Had bowel movement overnight-probably secondary to narcotic use-have placed on scheduled MiraLAX and senna.  Acute urinary retention: Currently on Foley catheter and Flomax, Foley catheter removed on 04/03/2018 will monitor post void bladder scans for 24 hours.  Hypokalemia and hypomagnesemia.  Both replaced.  Monitor.  DM-2: Poor outpatient control, continue Lantus and sliding scale.  Lantus dose adjusted for better control.   CBG (last 3)  Recent Labs    04/02/18 2206 04/03/18 0758 04/03/18 1221  GLUCAP 322* 475* 416*   Lab Results  Component Value Date   HGBA1C 12.6 (A) 02/26/2018     DVT Prophylaxis: Prophylactic heparin  Code Status: Full code  Family Communication: Parents multiple times clearly explained prognosis remains poor, now DNR.  They have clear understanding of his medical situation.  Disposition Plan: Briefly moved to ICU on 03/30/2018, now stabilized moved back to stepdown on 03/31/2018 and monitor  Antimicrobial agents: Anti-infectives (From admission, onward)   Start     Dose/Rate Route Frequency Ordered Stop   04/01/18 1300  nafcillin 2 g in sodium chloride 0.9 % 100 mL IVPB     2 g 200 mL/hr over 30 Minutes Intravenous Every 4 hours 04/01/18 1242     04/01/18 1200  nafcillin injection 2 g  Status:  Discontinued     2 g Intravenous Every 4 hours 04/01/18 1129 04/01/18 1242   03/31/18 0200  vancomycin (VANCOCIN) IVPB 750 mg/150 ml premix  Status:  Discontinued     750 mg 150 mL/hr over 60 Minutes Intravenous Every 8 hours 03/30/18 1628 04/01/18 1129   03/31/18 0100  meropenem  (MERREM) 1 g in sodium chloride 0.9 % 100 mL IVPB  Status:  Discontinued     1 g 200 mL/hr over 30 Minutes Intravenous Every 8 hours 03/30/18 1628 04/01/18 1129   03/30/18 1630  vancomycin (VANCOCIN) 1,500 mg in sodium chloride 0.9 % 500 mL IVPB     1,500 mg 250 mL/hr over 120 Minutes Intravenous NOW 03/30/18 1619 03/30/18 1925   03/30/18 1630  meropenem (MERREM) 2 g in sodium chloride 0.9 % 100 mL IVPB     2 g 200 mL/hr over 30 Minutes Intravenous NOW 03/30/18 1619 03/30/18 1754   03/21/18 0930  ceFAZolin (ANCEF) IVPB 2g/100 mL premix  Status:  Discontinued     2 g 200 mL/hr over 30 Minutes Intravenous Every 8 hours 03/21/18 0920 03/30/18 1616      Procedures:   MRI C, T and L-spine done 03/30/2018 and 03/31/2018.    CT angiogram chest 03/30/2018.  1. No pulmonary embolus. 2. Moderate loculated left pleural effusion. Adjacent airspace disease likely compressive atelectasis, pneumonia not excluded in the setting of spinal infection and endocarditis. Mild right pleural thickening or loculated pleural fluid in the upper right hemithorax.  Repeat echocardiogram 03/30/2018 - Left ventricle: The cavity size was normal. Wall thickness was normal. Systolic  function was normal. The estimated ejection fraction was in the range of 60% to 65%. Wall motion was normal; there were no regional wall motion abnormalities. Left ventricular diastolic function parameters were normal.  TEE - - Left ventricle: The cavity size was normal. Wall thickness wasnormal. Systolic function was normal. The estimated ejectionfraction was in the range of 60% to 65%. - Aortic valve: No evidence of vegetation. - Mitral valve: No evidence of vegetation. - Left atrium: No evidence of thrombus in the atrial cavity or appendage. No evidence of thrombus in the appendage. - Right atrium: No evidence of thrombus in the atrial cavity orappendage. - Tricuspid valve: There was a vegetation. There was a small (1.2cmx .6cm),  mobile vegetation on the septal leaflet. There was mildregurgitation. - Pulmonic valve: No evidence of vegetation.  Impressions:   Tricuspid valve endocarditis. Discussed with primary team   MRI C-T-L Spine - 1. Epidural abscess from C2 to sacrum as described. Maximal cord compression from T3-4 to T5-6. The thecal sac is completely effaced from L1 to L4-5. At the L3-4 interspinous space the spinal abscess communicates with large bilateral abscesses within the intrinsic back muscles. There is osteomyelitis of the L2, L3, and L4 spinous processes. No discitis or facet arthritis. 2. Left paravertebral abscess along the lower thoracic spine with small left empyema that is new from CT 2 days ago. 3. Distended bladder  CT - 1. Evidence of cellulitis involving the right side of the base of the neck extending into the right supraclavicular region with fluid and gas in the soft tissues at the base of the right side of the neck. 2. No significant abnormality of the thoracic spine. 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  CT -  1. Extensive abnormal fluid collection in the subcutaneous fat of the midline of the back with underlying marked abnormality of the posterior paraspinal musculature from L1-2 through S3. This is worrisome for subcutaneous abscess and myositis. 2. Moth-eaten appearance of the spinous processes of L3 and L4 consistent with osteomyelitis. 3. No discrete epidural abscess. However, the abnormal edema in the paraspinal musculature extends to the posterior aspect of the spinal canal at L3-4. 4. MRI with and without contrast may better define the extent of the soft tissue and infection and could detect epidural extension that is not apparent on this unenhanced CT scan.  9/22>> PICC line  I&D by [facility] of back soft tissue abscess on -  03/22/18    CONSULTS: N.Surgery, ID, general surgery, PCCM  Time spent: 25- minutes-Greater than 50% of this time was spent in counseling, explanation of  diagnosis, planning of further management, and coordination of care.  MEDICATIONS: Scheduled Meds: . aspirin  81 mg Oral Daily  . atorvastatin  40 mg Oral q1800  . buprenorphine-naloxone  2 tablet Sublingual Daily  . feeding supplement (PRO-STAT SUGAR FREE 64)  30 mL Oral TID WC  . gabapentin  300 mg Oral TID  . heparin injection (subcutaneous)  5,000 Units Subcutaneous Q8H  . hydrocortisone sod succinate (SOLU-CORTEF) inj  50 mg Intravenous Daily  . insulin aspart  0-5 Units Subcutaneous QHS  . insulin aspart  0-9 Units Subcutaneous TID WC  . insulin aspart  4 Units Subcutaneous TID WC  . insulin glargine  25 Units Subcutaneous QHS  . metoprolol tartrate  50 mg Oral BID  . nicotine  21 mg Transdermal Daily  . polyethylene glycol  17 g Oral BID  . senna-docusate  2 tablet Oral QHS  . sodium  chloride flush  10-40 mL Intracatheter Q12H  . tamsulosin  0.4 mg Oral Daily   Continuous Infusions: . nafcillin IV 2 g (04/03/18 0952)   PRN Meds:.acetaminophen, bisacodyl, clonazepam, cyclobenzaprine, hydrALAZINE, LORazepam, magnesium hydroxide, metoprolol tartrate, nitroGLYCERIN, sodium phosphate   PHYSICAL EXAM: Vital signs: Vitals:   04/02/18 0519 04/02/18 1339 04/02/18 2204 04/03/18 0557  BP: 133/77 128/73 123/71 135/81  Pulse: 79 75 83 (!) 111  Resp: 18 18    Temp: 97.9 F (36.6 C) 97.8 F (36.6 C) 98 F (36.7 C) 98.1 F (36.7 C)  TempSrc: Oral Oral Oral Oral  SpO2: 91% 95% 95% 97%  Weight:      Height:       Filed Weights   03/21/18 0602  Weight: 68 kg   Body mass index is 20.92 kg/m.   Exam  Awake Alert, Oriented X 3, No new F.N deficits, Normal affect [place].AT,PERRAL Supple Neck,No JVD, No cervical lymphadenopathy appriciated.  Symmetrical Chest wall movement, Good air movement bilaterally, CTAB RRR,No Gallops, Rubs or new Murmurs, No Parasternal Heave +ve B.Sounds, Abd Soft, No tenderness, No organomegaly appriciated, No rebound - guarding or rigidity. No  Cyanosis, Clubbing or edema, No new Rash or bruise L Leg 5/5  I have personally reviewed following labs and imaging studies  LABORATORY DATA: CBC: Recent Labs  Lab 03/30/18 0227 03/30/18 1837 03/31/18 0336 04/02/18 0625  WBC 14.5* 25.2* 24.8* 22.2*  HGB 9.2* 7.6* 7.5* 9.1*  HCT 28.6* 24.3* 23.7* 28.7*  MCV 95.7 98.4 97.1 96.6  PLT 382 365 358 485*    Basic Metabolic Panel: Recent Labs  Lab 03/30/18 0227 03/31/18 0336 04/01/18 0311 04/02/18 0625  NA 135 135 134* 140  K 2.8* 3.7 3.5 3.6  CL 90* 94* 98 97*  CO2 34* 31 30 30   GLUCOSE 230* 266* 305* 360*  BUN 6 9 8 15   CREATININE 0.68 0.62 0.58* 0.76  CALCIUM 8.5* 7.7* 7.7* 8.4*  MG 1.6* 2.0 1.9 2.0  PHOS  --  2.7  --   --     GFR: Estimated Creatinine Clearance: 116.9 mL/min (by C-G formula based on SCr of 0.76 mg/dL).  Liver Function Tests: Recent Labs  Lab 04/02/18 0625  AST 22  ALT 14  ALKPHOS 140*  BILITOT 0.7  PROT 6.5  ALBUMIN 1.5*   No results for input(s): LIPASE, AMYLASE in the last 168 hours. No results for input(s): AMMONIA in the last 168 hours.  Coagulation Profile: No results for input(s): INR, PROTIME in the last 168 hours.  Cardiac Enzymes: Recent Labs  Lab 03/30/18 0219 03/30/18 1222 03/30/18 1620  TROPONINI <0.03 <0.03 <0.03    BNP (last 3 results) No results for input(s): PROBNP in the last 8760 hours.  HbA1C: No results for input(s): HGBA1C in the last 72 hours.  CBG: Recent Labs  Lab 04/02/18 1146 04/02/18 1717 04/02/18 2206 04/03/18 0758 04/03/18 1221  GLUCAP 244* 295* 322* 475* 416*    Lipid Profile: No results for input(s): CHOL, HDL, LDLCALC, TRIG, CHOLHDL, LDLDIRECT in the last 72 hours.  Thyroid Function Tests: No results for input(s): TSH, T4TOTAL, FREET4, T3FREE, THYROIDAB in the last 72 hours.  Anemia Panel: Recent Labs    04/01/18 1237 04/01/18 1238  VITAMINB12 886  --   FOLATE  --  9.5  FERRITIN 583*  --   TIBC 157*  --   IRON 31*  --     RETICCTPCT 1.9  --     Urine analysis:  Component Value Date/Time   COLORURINE YELLOW 03/30/2018 1930   APPEARANCEUR CLEAR 03/30/2018 1930   LABSPEC 1.009 03/30/2018 1930   PHURINE 6.0 03/30/2018 1930   GLUCOSEU 50 (A) 03/30/2018 1930   HGBUR SMALL (A) 03/30/2018 1930   BILIRUBINUR NEGATIVE 03/30/2018 1930   BILIRUBINUR small 02/26/2018 1202   KETONESUR NEGATIVE 03/30/2018 1930   PROTEINUR NEGATIVE 03/30/2018 1930   UROBILINOGEN 1.0 02/26/2018 1202   UROBILINOGEN 1.0 12/11/2016 1020   NITRITE NEGATIVE 03/30/2018 1930   LEUKOCYTESUR TRACE (A) 03/30/2018 1930    Sepsis Labs: Lactic Acid, Venous    Component Value Date/Time   LATICACIDVEN 1.2 03/30/2018 1837    MICROBIOLOGY: Recent Results (from the past 240 hour(s))  Culture, blood (single)     Status: None (Preliminary result)   Collection Time: 03/30/18  4:12 PM  Result Value Ref Range Status   Specimen Description BLOOD RIGHT ANTECUBITAL  Final   Special Requests   Final    BOTTLES DRAWN AEROBIC ONLY Blood Culture adequate volume   Culture   Final    NO GROWTH 3 DAYS Performed at [facility], [address] [place], [place] [address]    Report Status PENDING  Incomplete  Culture, Urine     Status: None   Collection Time: 03/30/18  4:20 PM  Result Value Ref Range Status   Specimen Description URINE, CATHETERIZED  Final   Special Requests NONE  Final   Culture   Final    NO GROWTH Performed at [facility], [address], [place], [place] [address]    Report Status 04/01/2018 FINAL  Final  Culture, blood (Routine X 2) w Reflex to ID Panel     Status: None (Preliminary result)   Collection Time: 04/01/18  9:49 AM  Result Value Ref Range Status   Specimen Description BLOOD BLOOD RIGHT HAND  Final   Special Requests   Final    BOTTLES DRAWN AEROBIC ONLY Blood Culture results may not be optimal due to an inadequate volume of blood received in culture bottles   Culture   Final    NO GROWTH 1  DAY Performed at [name], [address], [place], [place] [address]    Report Status PENDING  Incomplete  Culture, blood (Routine X 2) w Reflex to ID Panel     Status: None (Preliminary result)   Collection Time: 04/01/18 10:02 AM  Result Value Ref Range Status   Specimen Description BLOOD RIGHT ANTECUBITAL  Final   Special Requests   Final    BOTTLES DRAWN AEROBIC ONLY Blood Culture adequate volume   Culture   Final    NO GROWTH 1 DAY Performed at [facility], [address] [place], [place] [address]    Report Status PENDING  Incomplete    RADIOLOGY STUDIES/RESULTS: Ct Angio Chest Pe W Or Wo Contrast  Result Date: 03/30/2018 CLINICAL DATA:  Shortness of breath PE suspected, high pretest prob. Patient with known epidural abscess and endocarditis. Now with left-sided chest pain. EXAM: CT ANGIOGRAPHY CHEST WITH CONTRAST TECHNIQUE: Multidetector CT imaging of the chest was performed using the standard protocol during bolus administration of intravenous contrast. Multiplanar CT image reconstructions and MIPs were obtained to evaluate the vascular anatomy. CONTRAST:  54mL ISOVUE-370 IOPAMIDOL (ISOVUE-370) INJECTION 76% COMPARISON:  Chest radiographs earlier this day. FINDINGS: Cardiovascular: There are no filling defects within the pulmonary arteries to suggest pulmonary embolus. The thoracic aorta is normal in caliber without dissection. Mild  cardiomegaly. Small amount pericardial fluid. Mediastinum/Nodes: Loculated left pleural effusion tracks along the left aspect of the mediastinum. Small left hilar nodes without bulky adenopathy. No enlarged mediastinal nodes. No dominant thyroid nodule. The esophagus is decompressed. Lungs/Pleura: Moderate left pleural effusion is loculated and tracks along the posterior, lateral, and medial hemithorax. Adjacent airspace disease in the left lower lobe, favoring compressive atelectasis, with adjacent compressive atelectasis in the right upper  lobe. Pleural thickening versus loculated pleural effusion the upper medial right hemithorax, with additional areas of pleural thickening dependently. Dependent atelectasis in the right lower lobe. Trachea and bronchi are patent. Upper Abdomen: No acute findings. Musculoskeletal: Patient with known epidural abscess, not well delineated by CT. No bony destructive change. Review of the MIP images confirms the above findings. IMPRESSION: 1. No pulmonary embolus. 2. Moderate loculated left pleural effusion. Adjacent airspace disease likely compressive atelectasis, pneumonia not excluded in the setting of spinal infection and endocarditis. Mild right pleural thickening or loculated pleural fluid in the upper right hemithorax. Electronically Signed   By: Narda Rutherford M.D.   On: 03/30/2018 23:21   Ct Thoracic Spine Wo Contrast  Result Date: 03/20/2018 CLINICAL DATA:  Progressive back pain.  Swelling of the lower back. EXAM: CT THORACIC SPINE WITHOUT CONTRAST TECHNIQUE: Multidetector CT images of the thoracic were obtained using the standard protocol without intravenous contrast. COMPARISON:  None. FINDINGS: Alignment: Normal. 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 Paraspinal and other soft tissues: There is abnormal gas and fluid in the soft tissues of the right side of the base of the neck and in the right supraclavicular region. Paraspinal soft tissues appear normal throughout the thoracic spine. Disc levels: There is no evidence of disc protrusion or significant disc bulging or spinal or foraminal stenosis or other significant abnormality of the thoracic spine. IMPRESSION: 1. Evidence of cellulitis involving the right side of the base of the neck extending into the right supraclavicular region with fluid and gas in the soft tissues at the base of the right side of the neck. 2. No significant abnormality of the thoracic spine. 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 Electronically Signed   By: Francene Boyers M.D.   On: 03/20/2018 11:25   Ct Lumbar Spine Wo Contrast  Addendum Date: 03/20/2018   ADDENDUM REPORT: 03/20/2018 11:44 ADDENDUM: Critical Value/emergent results were called by telephone at the time of interpretation on 03/20/2018 at 11:30 am to Dr. Sharyn Creamer , who verbally acknowledged these results. Electronically Signed   By: Francene Boyers M.D.   On: 03/20/2018 11:44   Result Date: 03/20/2018 CLINICAL DATA:  Increasing low back pain and soft tissue swelling. EXAM: CT LUMBAR SPINE WITHOUT CONTRAST TECHNIQUE: Multidetector CT imaging of the lumbar spine was performed without intravenous contrast administration. Multiplanar CT image reconstructions were also generated. IV contrast could not be utilized due to the lack of an appropriate IV. COMPARISON:  None. FINDINGS: Segmentation: 5 lumbar type vertebrae. Alignment: Normal. Vertebrae: There is a moth-eaten appearance of the spinous processes of L3 and L4 which is worrisome for osteomyelitis. Bilateral pars defects at L5 with grade 1 spondylolisthesis. 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 Paraspinal and other soft tissues: There is an extensive abnormal fluid collection in the subcutaneous soft tissues of the posterior aspect of the back extending from approximately L1-2 to S3. This fluid collection is lobulated and measures approximately 20 x 9 x 2.5 cm. It is centered slightly to the left of midline and has a mass effect upon the adjacent posterior  paraspinal muscles. There is abnormal lucency in the underlying paraspinal muscles which could represent myositis. Disc levels: T11-12: No significant abnormality. Butterfly T12 vertebra. T12-L1: No significant abnormality. L1-2: Normal disc. Abnormal edema in the posterior paraspinal musculature with adjacent fluid collection in the subcutaneous fat of the posterior aspect of the back as described above. L2-3: Normal disc. L3-4: Normal disc. Lucency in the posterior paraspinal soft tissues extends to  the posterior aspect of the thecal sac on image 80 of series 4 but there is no discrete epidural abscess. L4-5: Normal disc.  No evidence of epidural abscess. L5-S1: Grade 1 spondylolisthesis. No disc bulging or protrusion. Bilateral pars defects. No visible epidural abscess. IMPRESSION: 1. Extensive abnormal fluid collection in the subcutaneous fat of the midline of the back with underlying marked abnormality of the posterior paraspinal musculature from L1-2 through S3. This is worrisome for subcutaneous abscess and myositis. 2. Moth-eaten appearance of the spinous processes of L3 and L4 consistent with osteomyelitis. 3. No discrete epidural abscess. However, the abnormal edema in the paraspinal musculature extends to the posterior aspect of the spinal canal at L3-4. 4. MRI with and without contrast may better define the extent of the soft tissue and infection and could detect epidural extension that is not apparent on this unenhanced CT scan. Electronically Signed: By: Francene Boyers M.D. On: 03/20/2018 11:18   Mr Cervical Spine Wo Contrast  Result Date: 03/30/2018 CLINICAL DATA:  42 year old male with history of MSSA bacteremia with known endocarditis and epidural abscess extending from C2 through the sacrum. Recent episode of hypotension and acute lower extremity weakness. Evaluate abscess and possible spinal cord infarct. EXAM: MRI CERVICAL, THORACIC SPINE WITHOUT CONTRAST TECHNIQUE: Multiplanar and multiecho pulse sequences of the cervical spine, to include the craniocervical junction and cervicothoracic junction, and thoracic and lumbar spine, were obtained without intravenous contrast. COMPARISON:  Prior MRI from 03/22/2018. FINDINGS: MRI CERVICAL SPINE FINDINGS Alignment: Examination technically limited as the patient was unable to tolerate the full length of the exam. Additionally, images provided are degraded by motion artifact. Reversal of the normal cervical lordosis with apex at C5, stable. No  interval listhesis or malalignment. Vertebrae: Vertebral body height maintained without acute or interval fracture. Bone marrow signal intensity diffusely decreased on T1 weighted imaging, suspected to be related to anemia and chronic disease. No discrete osseous lesions. No abnormal marrow edema. No evidence for interval discitis. Cord: Signal intensity within the cervical spinal cord is within normal limits. No findings to suggest interval cord infarction on this motion degraded and limited exam. Previously identified epidural collection involving the ventral epidural space extending from C2 inferiorly is decreased in size, now measuring up to 7 mm in maximal AP diameter at the level of C2. Collection diffusely involves the ventral epidural space, but also is seen dorsally as well. Slightly improved diffuse spinal stenosis with thecal sac patency. Posterior Fossa, vertebral arteries, paraspinal tissues: Visualized brain and posterior fossa within normal limits. Craniocervical junction normal. Scattered edema within the posterior paraspinous soft tissues. Normal intravascular flow voids seen within the vertebral arteries bilaterally. Disc levels: C2-C3: Unremarkable. C3-C4: Small left foraminal protrusion with associated moderate left C4 foraminal stenosis. C4-C5:  Unremarkable. C5-C6: Left eccentric disc bulge with uncovertebral hypertrophy. Moderate left C6 foraminal narrowing. C6-C7: Right foraminal disc protrusion with associated moderate right C7 foraminal stenosis. C7-T1:  Unremarkable. MRI THORACIC SPINE FINDINGS Alignment: Examination markedly limited as the patient was unable to tolerate the full length of the exam. Sagittal T1, T2, and  STIR sequences only were performed. No axial images obtained. Vertebral bodies normally aligned with preservation of the normal thoracic kyphosis. Vertebrae: Vertebral body height maintained without evidence for interval fracture. Butterfly vertebra noted at T12. Diffusely  decreased T1 weighted signal intensity throughout the visualized bone marrow, like related to anemia chronic disease. No findings to suggest interval or new discitis or septic arthritis. Cord: Signal intensity within the thoracic spinal cord grossly within normal limits on these limited sagittal views. No definite cord signal abnormality or cord edema to suggest acute spinal cord infarction. Previously seen diffuse epidural collection appears overall decreased in size from previous, with improved spinal stenosis and thecal sac patency. Paraspinal and other soft tissues: Scattered edema seen within the posterior paraspinous soft tissues at the upper back/cervicothoracic junction. No discrete soft tissue collections. Disc levels: T5-6: Central disc protrusion indenting upon the ventral thoracic spinal cord, stable. IMPRESSION: 1. Technically limited exam due to motion artifact and the patient's inability to tolerate the full length of the exam. Cervical and thoracic spine only was imaged, and only sagittal sequences of the thoracic spine were obtained. 2. No imaging findings to suggest spinal cord infarction identified on this limited exam. 3. Interval improvement in diffuse epidural collection, decreased in size as compared to previous exam with improved diffuse spinal stenosis and thecal sac patency. No new discitis or facet arthritis. Electronically Signed   By: Rise Mu M.D.   On: 03/30/2018 22:52   Mr Thoracic Spine Wo Contrast  Result Date: 03/30/2018 CLINICAL DATA:  42 year old male with history of MSSA bacteremia with known endocarditis and epidural abscess extending from C2 through the sacrum. Recent episode of hypotension and acute lower extremity weakness. Evaluate abscess and possible spinal cord infarct. EXAM: MRI CERVICAL, THORACIC SPINE WITHOUT CONTRAST TECHNIQUE: Multiplanar and multiecho pulse sequences of the cervical spine, to include the craniocervical junction and cervicothoracic  junction, and thoracic and lumbar spine, were obtained without intravenous contrast. COMPARISON:  Prior MRI from 03/22/2018. FINDINGS: MRI CERVICAL SPINE FINDINGS Alignment: Examination technically limited as the patient was unable to tolerate the full length of the exam. Additionally, images provided are degraded by motion artifact. Reversal of the normal cervical lordosis with apex at C5, stable. No interval listhesis or malalignment. Vertebrae: Vertebral body height maintained without acute or interval fracture. Bone marrow signal intensity diffusely decreased on T1 weighted imaging, suspected to be related to anemia and chronic disease. No discrete osseous lesions. No abnormal marrow edema. No evidence for interval discitis. Cord: Signal intensity within the cervical spinal cord is within normal limits. No findings to suggest interval cord infarction on this motion degraded and limited exam. Previously identified epidural collection involving the ventral epidural space extending from C2 inferiorly is decreased in size, now measuring up to 7 mm in maximal AP diameter at the level of C2. Collection diffusely involves the ventral epidural space, but also is seen dorsally as well. Slightly improved diffuse spinal stenosis with thecal sac patency. Posterior Fossa, vertebral arteries, paraspinal tissues: Visualized brain and posterior fossa within normal limits. Craniocervical junction normal. Scattered edema within the posterior paraspinous soft tissues. Normal intravascular flow voids seen within the vertebral arteries bilaterally. Disc levels: C2-C3: Unremarkable. C3-C4: Small left foraminal protrusion with associated moderate left C4 foraminal stenosis. C4-C5:  Unremarkable. C5-C6: Left eccentric disc bulge with uncovertebral hypertrophy. Moderate left C6 foraminal narrowing. C6-C7: Right foraminal disc protrusion with associated moderate right C7 foraminal stenosis. C7-T1:  Unremarkable. MRI THORACIC SPINE  FINDINGS Alignment: Examination markedly limited  as the patient was unable to tolerate the full length of the exam. Sagittal T1, T2, and STIR sequences only were performed. No axial images obtained. Vertebral bodies normally aligned with preservation of the normal thoracic kyphosis. Vertebrae: Vertebral body height maintained without evidence for interval fracture. Butterfly vertebra noted at T12. Diffusely decreased T1 weighted signal intensity throughout the visualized bone marrow, like related to anemia chronic disease. No findings to suggest interval or new discitis or septic arthritis. Cord: Signal intensity within the thoracic spinal cord grossly within normal limits on these limited sagittal views. No definite cord signal abnormality or cord edema to suggest acute spinal cord infarction. Previously seen diffuse epidural collection appears overall decreased in size from previous, with improved spinal stenosis and thecal sac patency. Paraspinal and other soft tissues: Scattered edema seen within the posterior paraspinous soft tissues at the upper back/cervicothoracic junction. No discrete soft tissue collections. Disc levels: T5-6: Central disc protrusion indenting upon the ventral thoracic spinal cord, stable. IMPRESSION: 1. Technically limited exam due to motion artifact and the patient's inability to tolerate the full length of the exam. Cervical and thoracic spine only was imaged, and only sagittal sequences of the thoracic spine were obtained. 2. No imaging findings to suggest spinal cord infarction identified on this limited exam. 3. Interval improvement in diffuse epidural collection, decreased in size as compared to previous exam with improved diffuse spinal stenosis and thecal sac patency. No new discitis or facet arthritis. Electronically Signed   By: Rise Mu M.D.   On: 03/30/2018 22:52   Mr Cervical Spine W Wo Contrast  Result Date: 03/22/2018 CLINICAL DATA:  Spine infection. EXAM:  MRI TOTAL SPINE WITHOUT AND WITH CONTRAST TECHNIQUE: Multisequence MR imaging of the spine from the cervical spine to the sacrum was performed prior to and following IV contrast administration. CONTRAST:  6 cc Gadavist intravenous COMPARISON:  CT of the thoracic and lumbar spine from 2 days ago FINDINGS: MRI CERVICAL SPINE FINDINGS Alignment: Normal Vertebrae: No evidence of osseous infection. Canal/Cord: There is extensive spinal fluid collection preferentially in the ventral but also in the right more than left dorsal canal. Subarachnoid space is diffusely effaced. Maximal thickness is posterior to C2 at 9 mm. No cord signal abnormality. Posterior Fossa, vertebral arteries, paraspinal tissues: No retropharyngeal or other discrete soft tissue collection. Disc levels: C2-3: Unremarkable. C3-4: Small left foraminal protrusion with moderate narrowing C4-5: Unremarkable. C5-6: Disc narrowing and bulging with asymmetric left uncovertebral spurring. Left foraminal impingement C6-7: Right foraminal protrusion mild narrowing. C7-T1:Unremarkable. MRI THORACIC SPINE FINDINGS Alignment:  Normal Vertebrae: No evidence of osteomyelitis or discitis. T12 butterfly vertebra. Canal/Cord: Cervical ventral epidural collection continues throughout the thoracic levels. There is also a focal dorsal component at T3-4 to T5-6, where thecal sac effacement is accentuated and there is cord flattening. No cord edema. Paraspinal and other soft tissues: Paraspinous phlegmon on the left at T8-T12, with new complex left pleural effusion and lower lobe atelectasis Disc levels: T5-6 central disc protrusion. MRI LUMBAR SPINE FINDINGS Segmentation:  5 lumbar type vertebral bodies Alignment:  Grade 1 anterolisthesis at L5-S1. Vertebrae: Marrow edema and heterogeneous enhancement within the L2, L3, and L4 spinous processes. No discitis or facet edema. Conus medullaris: Extends to the L1 level and is non edematous. There is extensive epidural collection  completely effacing the thecal sac throughout the lumbar spine until L4-5 and below where the collection becomes ventral and right eccentric. The infection communicates with extensive bilateral abscess within the intrinsic back muscles  via the interspinous space at L3-4. Patient had recent subcutaneous collection and left buttocks collection drainage, with packing seen in place. This midline, upper subcutaneous collection communicates with the paravertebral abscess along its superior margin based on postcontrast axial images. Paraspinal and other soft tissues: As above.  Distended bladder Disc levels: Chronic bilateral pars defects at L5. Critical Value/emergent results were called by telephone at the time of interpretation on 03/22/2018 at 3:04 pm to Dr. Thedore Mins , who verbally acknowledged these results. IMPRESSION: 1. Epidural abscess from C2 to sacrum as described. Maximal cord compression from T3-4 to T5-6. The thecal sac is completely effaced from L1 to L4-5. At the L3-4 interspinous space the spinal abscess communicates with large bilateral abscesses within the intrinsic back muscles. There is osteomyelitis of the L2, L3, and L4 spinous processes. No discitis or facet arthritis. 2. Left paravertebral abscess along the lower thoracic spine with small left empyema that is new from CT 2 days ago. 3. Distended bladder Electronically Signed   By: Marnee Spring M.D.   On: 03/22/2018 15:11   Mr Thoracic Spine W Wo Contrast  Result Date: 03/22/2018 CLINICAL DATA:  Spine infection. EXAM: MRI TOTAL SPINE WITHOUT AND WITH CONTRAST TECHNIQUE: Multisequence MR imaging of the spine from the cervical spine to the sacrum was performed prior to and following IV contrast administration. CONTRAST:  6 cc Gadavist intravenous COMPARISON:  CT of the thoracic and lumbar spine from 2 days ago FINDINGS: MRI CERVICAL SPINE FINDINGS Alignment: Normal Vertebrae: No evidence of osseous infection. Canal/Cord: There is extensive spinal  fluid collection preferentially in the ventral but also in the right more than left dorsal canal. Subarachnoid space is diffusely effaced. Maximal thickness is posterior to C2 at 9 mm. No cord signal abnormality. Posterior Fossa, vertebral arteries, paraspinal tissues: No retropharyngeal or other discrete soft tissue collection. Disc levels: C2-3: Unremarkable. C3-4: Small left foraminal protrusion with moderate narrowing C4-5: Unremarkable. C5-6: Disc narrowing and bulging with asymmetric left uncovertebral spurring. Left foraminal impingement C6-7: Right foraminal protrusion mild narrowing. C7-T1:Unremarkable. MRI THORACIC SPINE FINDINGS Alignment:  Normal Vertebrae: No evidence of osteomyelitis or discitis. T12 butterfly vertebra. Canal/Cord: Cervical ventral epidural collection continues throughout the thoracic levels. There is also a focal dorsal component at T3-4 to T5-6, where thecal sac effacement is accentuated and there is cord flattening. No cord edema. Paraspinal and other soft tissues: Paraspinous phlegmon on the left at T8-T12, with new complex left pleural effusion and lower lobe atelectasis Disc levels: T5-6 central disc protrusion. MRI LUMBAR SPINE FINDINGS Segmentation:  5 lumbar type vertebral bodies Alignment:  Grade 1 anterolisthesis at L5-S1. Vertebrae: Marrow edema and heterogeneous enhancement within the L2, L3, and L4 spinous processes. No discitis or facet edema. Conus medullaris: Extends to the L1 level and is non edematous. There is extensive epidural collection completely effacing the thecal sac throughout the lumbar spine until L4-5 and below where the collection becomes ventral and right eccentric. The infection communicates with extensive bilateral abscess within the intrinsic back muscles via the interspinous space at L3-4. Patient had recent subcutaneous collection and left buttocks collection drainage, with packing seen in place. This midline, upper subcutaneous collection  communicates with the paravertebral abscess along its superior margin based on postcontrast axial images. Paraspinal and other soft tissues: As above.  Distended bladder Disc levels: Chronic bilateral pars defects at L5. Critical Value/emergent results were called by telephone at the time of interpretation on 03/22/2018 at 3:04 pm to Dr. Thedore Mins , who verbally acknowledged  these results. IMPRESSION: 1. Epidural abscess from C2 to sacrum as described. Maximal cord compression from T3-4 to T5-6. The thecal sac is completely effaced from L1 to L4-5. At the L3-4 interspinous space the spinal abscess communicates with large bilateral abscesses within the intrinsic back muscles. There is osteomyelitis of the L2, L3, and L4 spinous processes. No discitis or facet arthritis. 2. Left paravertebral abscess along the lower thoracic spine with small left empyema that is new from CT 2 days ago. 3. Distended bladder Electronically Signed   By: Marnee Spring M.D.   On: 03/22/2018 15:11   Mr Lumbar Spine W Wo Contrast  Result Date: 03/22/2018 CLINICAL DATA:  Spine infection. EXAM: MRI TOTAL SPINE WITHOUT AND WITH CONTRAST TECHNIQUE: Multisequence MR imaging of the spine from the cervical spine to the sacrum was performed prior to and following IV contrast administration. CONTRAST:  6 cc Gadavist intravenous COMPARISON:  CT of the thoracic and lumbar spine from 2 days ago FINDINGS: MRI CERVICAL SPINE FINDINGS Alignment: Normal Vertebrae: No evidence of osseous infection. Canal/Cord: There is extensive spinal fluid collection preferentially in the ventral but also in the right more than left dorsal canal. Subarachnoid space is diffusely effaced. Maximal thickness is posterior to C2 at 9 mm. No cord signal abnormality. Posterior Fossa, vertebral arteries, paraspinal tissues: No retropharyngeal or other discrete soft tissue collection. Disc levels: C2-3: Unremarkable. C3-4: Small left foraminal protrusion with moderate narrowing  C4-5: Unremarkable. C5-6: Disc narrowing and bulging with asymmetric left uncovertebral spurring. Left foraminal impingement C6-7: Right foraminal protrusion mild narrowing. C7-T1:Unremarkable. MRI THORACIC SPINE FINDINGS Alignment:  Normal Vertebrae: No evidence of osteomyelitis or discitis. T12 butterfly vertebra. Canal/Cord: Cervical ventral epidural collection continues throughout the thoracic levels. There is also a focal dorsal component at T3-4 to T5-6, where thecal sac effacement is accentuated and there is cord flattening. No cord edema. Paraspinal and other soft tissues: Paraspinous phlegmon on the left at T8-T12, with new complex left pleural effusion and lower lobe atelectasis Disc levels: T5-6 central disc protrusion. MRI LUMBAR SPINE FINDINGS Segmentation:  5 lumbar type vertebral bodies Alignment:  Grade 1 anterolisthesis at L5-S1. Vertebrae: Marrow edema and heterogeneous enhancement within the L2, L3, and L4 spinous processes. No discitis or facet edema. Conus medullaris: Extends to the L1 level and is non edematous. There is extensive epidural collection completely effacing the thecal sac throughout the lumbar spine until L4-5 and below where the collection becomes ventral and right eccentric. The infection communicates with extensive bilateral abscess within the intrinsic back muscles via the interspinous space at L3-4. Patient had recent subcutaneous collection and left buttocks collection drainage, with packing seen in place. This midline, upper subcutaneous collection communicates with the paravertebral abscess along its superior margin based on postcontrast axial images. Paraspinal and other soft tissues: As above.  Distended bladder Disc levels: Chronic bilateral pars defects at L5. Critical Value/emergent results were called by telephone at the time of interpretation on 03/22/2018 at 3:04 pm to Dr. Thedore Mins , who verbally acknowledged these results. IMPRESSION: 1. Epidural abscess from C2 to  sacrum as described. Maximal cord compression from T3-4 to T5-6. The thecal sac is completely effaced from L1 to L4-5. At the L3-4 interspinous space the spinal abscess communicates with large bilateral abscesses within the intrinsic back muscles. There is osteomyelitis of the L2, L3, and L4 spinous processes. No discitis or facet arthritis. 2. Left paravertebral abscess along the lower thoracic spine with small left empyema that is new from CT 2  days ago. 3. Distended bladder Electronically Signed   By: Marnee Spring M.D.   On: 03/22/2018 15:11   Dg Chest Port 1 View  Result Date: 03/30/2018 CLINICAL DATA:  Shortness of breath EXAM: PORTABLE CHEST 1 VIEW COMPARISON:  03/30/2018 at 0227 hours FINDINGS: Moderate layering left pleural effusion, increased. Left lower lobe opacity, atelectasis versus pneumonia. Right lung is clear.  No pneumothorax. The heart is normal in size. IMPRESSION: Moderate layering left pleural effusion, increased. Left lower lobe opacity, atelectasis versus pneumonia. Electronically Signed   By: Charline Bills M.D.   On: 03/30/2018 19:06   Dg Chest Port 1 View  Result Date: 03/30/2018 CLINICAL DATA:  Chest pain EXAM: PORTABLE CHEST 1 VIEW COMPARISON:  None. FINDINGS: Retrocardiac opacity, atelectasis versus pneumonia. Possible small left pleural effusion. Right lung is clear. No pneumothorax. The heart is normal in size. IMPRESSION: Retrocardiac opacity, atelectasis versus pneumonia. Possible small left pleural effusion. Electronically Signed   By: Charline Bills M.D.   On: 03/30/2018 02:49   [place] Ekg Site Rite  Result Date: 03/21/2018 If Site Rite image not attached, placement could not be confirmed due to current cardiac rhythm.    LOS: 14 days   Signature  Susa Raring M.D on 04/03/2018 at 2:53 PM  To page go to [email or web address] - password [facility]

## 2018-04-03 NOTE — Progress Notes (Signed)
Overall stable.  Lower extremity strength at baseline.  No explanation for patient's transient weakness the other day.  Continue IV antibiotics.  No new recommendations.

## 2018-04-04 ENCOUNTER — Inpatient Hospital Stay: Payer: Self-pay

## 2018-04-04 LAB — GLUCOSE, CAPILLARY
GLUCOSE-CAPILLARY: 213 mg/dL — AB (ref 70–99)
GLUCOSE-CAPILLARY: 133 mg/dL — AB (ref 70–99)
GLUCOSE-CAPILLARY: 229 mg/dL — AB (ref 70–99)
Glucose-Capillary: 207 mg/dL — ABNORMAL HIGH (ref 70–99)

## 2018-04-04 LAB — CULTURE, BLOOD (SINGLE)
CULTURE: NO GROWTH
SPECIAL REQUESTS: ADEQUATE

## 2018-04-04 LAB — BASIC METABOLIC PANEL
ANION GAP: 6 (ref 5–15)
BUN: 10 mg/dL (ref 6–20)
CALCIUM: 7.7 mg/dL — AB (ref 8.9–10.3)
CO2: 32 mmol/L (ref 22–32)
Chloride: 100 mmol/L (ref 98–111)
Creatinine, Ser: 0.66 mg/dL (ref 0.61–1.24)
GFR calc Af Amer: >60 mL/min (ref >60)
Glucose, Bld: 258 mg/dL — ABNORMAL HIGH (ref 70–99)
POTASSIUM: 3 mmol/L — AB (ref 3.5–5.1)
Sodium: 138 mmol/L (ref 135–145)

## 2018-04-04 LAB — MAGNESIUM: MAGNESIUM: 1.6 mg/dL — AB (ref 1.7–2.4)

## 2018-04-04 MED ORDER — SODIUM CHLORIDE 0.9% FLUSH
10.0000 mL | INTRAVENOUS | Status: DC | PRN
  Administered 2018-04-10: 10 mL
  Administered 2018-04-16: 20 mL
  Filled 2018-04-04 (×2): qty 40

## 2018-04-04 NOTE — Progress Notes (Signed)
Patient was unable to void and in and out was performed, removing 1700cc of urine.  Patient tolerated well.  Will continue to monitor.

## 2018-04-04 NOTE — Progress Notes (Addendum)
Patient foley was removed on day shift and patient was unable to void on his own.  In and out cath was performed at 01:30 and 1700cc was removed.  Patient tolerated procedure well.  Will continue to monitor.

## 2018-04-04 NOTE — Progress Notes (Signed)
Peripherally Inserted Central Catheter/Midline Placement  The IV Nurse has discussed with the patient and/or persons authorized to consent for the patient, the purpose of this procedure and the potential benefits and risks involved with this procedure.  The benefits include less needle sticks, lab draws from the catheter, and the patient may be discharged home with the catheter. Risks include, but not limited to, infection, bleeding, blood clot (thrombus formation), and puncture of an artery; nerve damage and irregular heartbeat and possibility to perform a PICC exchange if needed/ordered by physician.  Alternatives to this procedure were also discussed.  Bard Power PICC patient education guide, fact sheet on infection prevention and patient information card has been provided to patient /or left at bedside.    PICC/Midline Placement Documentation  PICC Single Lumen 04/04/18 PICC Right Brachial 40 cm 0 cm (Active)  Indication for Insertion or Continuance of Line Prolonged intravenous therapies 04/04/2018  9:57 AM  Exposed Catheter (cm) 0 cm 04/04/2018  9:57 AM  Site Assessment Clean;Dry;Intact 04/04/2018  9:57 AM  Line Status Flushed;Blood return noted;Saline locked 04/04/2018  9:57 AM  Dressing Type Transparent 04/04/2018  9:57 AM  Dressing Status Intact;Dry;Clean 04/04/2018  9:57 AM  Dressing Intervention New dressing 04/04/2018  9:57 AM  Dressing Change Due 04/11/18 04/04/2018  9:57 AM       Tony Long 04/04/2018, 9:59 AM

## 2018-04-04 NOTE — Progress Notes (Signed)
PROGRESS NOTE        PATIENT DETAILS  Name: Tony Long  Age: 42 y.o.  Sex: male Date of Birth: Oct 14, 1975 Admit Date: 03/20/2018 Admitting Physician Kendell Bane, MD  WUJ:[id number], Rolm Gala, FNP   Brief Narrative: [name] a 42 y.o. male history of IVDA, DM-2, hypertension admitted for worsening back pain-further evaluation revealed MSSA bacteremia with tricuspid valve endocarditis, epidural abscess involving C2 through sacrum, and numerous soft tissue back abscesses.  Evaluated by general surgery-underwent I&D of her lower back soft tissue abscess, evaluated by neurosurgery-not felt to be a candidate for decompressive surgery due to extensive nature of the disease and lack of any significant neurological findings.  ID following with plans to continue Ancef with stop date of 05/25/2018.  See below for further details  Subjective: Patient in bed, appears comfortable, denies any headache, no fever, no chest pain or pressure, no shortness of breath , no abdominal pain. No focal weakness.  Assessment/Plan:   MSSA Abscess with tricuspid valve endocarditis, extensive epidural abscess (C2 through sacrum) and large soft tissue lower back abscess Blood cultures negative: due to extensive nature of the epidural abscess and lack of concerning neurological findings-neurosurgery does not recommend decompressive surgery. Seen by ID and current recommendation is total of 4 weeks of antibiotics with a tentative stop date now changed by ID to 04/16/2018 that after 4 weeks of Keflex 500 p.o. twice daily.  Given history of IVDA-not a candidate for outpatient IV antimicrobial therapy. He has received a PICC line, continue IV antibiotics through PEG.  Patient doing fairly well, hospital course was complicated by an episode of sepsis which arose on 03/30/2018, at that time he was frankly septic and had developed some transient left lower extremity weakness as well of unclear etiology,  at that point he had to be briefly moved to ICU, at that point antibiotics were broadened and he is now stable.    He was initially kept on IV cefazolin and then antibiotic coverage was broadened to Vancomycin and Rocephin on 03/30/2018 by ID.  His Rocephin will stop on 04/06/2018, vancomycin will stop on 04/16/2018 thereafter 4 weeks of Keflex 500 p.o. twice daily per ID.  Patient aware of the extensive nature of this infection-with life-threatening and life disabling risks including quadriplegia along with life-threatening sepsis with this present infection which is nonsurgical.  This was explained by me to the patient several times along with his parents bedside on 03/22/2018 and 03/23/2018, 03/30/2018.  Repeat Surveillance cultures are negative and he is going to receive a PICC line on 04/05/2018.   Cocaine/Subutex abuse: Withdrawal symptoms much better-on 9/25 patient developed excessive sedation-Klonopin/Flexeril has been changed to as needed dosing-clonidine being tapered down.  On 03/28/2018 he requested me that he be put back on his Suboxone, he does not want short-acting pain medications anymore, changes will be made per his request.    Polysubstance abuse/IVDA: Counseled to quit.  Anemia.  Normocytic, he had some chronic anemia upon admission as well and now worse due to him dilution from IV fluids.  Also some indices are low and could have some element of iron deficiency as well due to multiple blood draws etc., will place on oral iron for now thereafter outpatient PCP and GI work-up as needed.  Mild lower extremity leg edema.  Likely due to multiple liters of fluid given  while he was septic, TED stockings and gentle Lasix on 04/03/2018 and monitor.    New onset of left-sided pleuritic chest pain starting on late night 03/29/2018.  Nonspecific EKG changes stable on combination of aspirin, beta-blocker and statin.  Echocardiogram shows no wall motion abnormality and preserved EF, pain is  completely resolved.  Hypertension: Monitor on beta-blocker.  Constipation: Had bowel movement overnight-probably secondary to narcotic use-have placed on scheduled MiraLAX and senna.  Acute urinary retention: Continue Foley and Flomax, trial of Foley removal failed on 04/03/2018, likely combination of lack of activity and spine infection.  Hypokalemia and hypomagnesemia.  Both replaced.  Monitor.  DM-2: Poor outpatient control, continue Lantus and sliding scale.  Lantus dose adjusted for better control.   CBG (last 3)  Recent Labs    04/03/18 1936 04/03/18 2109 04/04/18 0809  GLUCAP 305* 271* 213*   Lab Results  Component Value Date   HGBA1C 12.6 (A) 02/26/2018     DVT Prophylaxis: Prophylactic heparin  Code Status: Full code  Family Communication: Parents multiple times clearly explained prognosis remains poor, now DNR.  They have clear understanding of his medical situation.  Disposition Plan: Briefly moved to ICU on 03/30/2018, now stabilized moved back to stepdown on 03/31/2018 and monitor  Antimicrobial agents: Anti-infectives (From admission, onward)   Start     Dose/Rate Route Frequency Ordered Stop   04/01/18 1300  nafcillin 2 g in sodium chloride 0.9 % 100 mL IVPB     2 g 200 mL/hr over 30 Minutes Intravenous Every 4 hours 04/01/18 1242     04/01/18 1200  nafcillin injection 2 g  Status:  Discontinued     2 g Intravenous Every 4 hours 04/01/18 1129 04/01/18 1242   03/31/18 0200  vancomycin (VANCOCIN) IVPB 750 mg/150 ml premix  Status:  Discontinued     750 mg 150 mL/hr over 60 Minutes Intravenous Every 8 hours 03/30/18 1628 04/01/18 1129   03/31/18 0100  meropenem (MERREM) 1 g in sodium chloride 0.9 % 100 mL IVPB  Status:  Discontinued     1 g 200 mL/hr over 30 Minutes Intravenous Every 8 hours 03/30/18 1628 04/01/18 1129   03/30/18 1630  vancomycin (VANCOCIN) 1,500 mg in sodium chloride 0.9 % 500 mL IVPB     1,500 mg 250 mL/hr over 120 Minutes  Intravenous NOW 03/30/18 1619 03/30/18 1925   03/30/18 1630  meropenem (MERREM) 2 g in sodium chloride 0.9 % 100 mL IVPB     2 g 200 mL/hr over 30 Minutes Intravenous NOW 03/30/18 1619 03/30/18 1754   03/21/18 0930  ceFAZolin (ANCEF) IVPB 2g/100 mL premix  Status:  Discontinued     2 g 200 mL/hr over 30 Minutes Intravenous Every 8 hours 03/21/18 0920 03/30/18 1616      Procedures:   MRI C, T and L-spine done 03/30/2018 and 03/31/2018.    CT angiogram chest 03/30/2018.  1. No pulmonary embolus. 2. Moderate loculated left pleural effusion. Adjacent airspace disease likely compressive atelectasis, pneumonia not excluded in the setting of spinal infection and endocarditis. Mild right pleural thickening or loculated pleural fluid in the upper right hemithorax.  Repeat echocardiogram 03/30/2018 - Left ventricle: The cavity size was normal. Wall thickness was normal. Systolic function was normal. The estimated ejection fraction was in the range of 60% to 65%. Wall motion was normal; there were no regional wall motion abnormalities. Left ventricular diastolic function parameters were normal.  TEE - - Left ventricle: The cavity size was  normal. Wall thickness wasnormal. Systolic function was normal. The estimated ejectionfraction was in the range of 60% to 65%. - Aortic valve: No evidence of vegetation. - Mitral valve: No evidence of vegetation. - Left atrium: No evidence of thrombus in the atrial cavity or appendage. No evidence of thrombus in the appendage. - Right atrium: No evidence of thrombus in the atrial cavity orappendage. - Tricuspid valve: There was a vegetation. There was a small (1.2cmx .6cm), mobile vegetation on the septal leaflet. There was mildregurgitation. - Pulmonic valve: No evidence of vegetation.  Impressions:   Tricuspid valve endocarditis. Discussed with primary team   MRI C-T-L Spine - 1. Epidural abscess from C2 to sacrum as described. Maximal cord compression  from T3-4 to T5-6. The thecal sac is completely effaced from L1 to L4-5. At the L3-4 interspinous space the spinal abscess communicates with large bilateral abscesses within the intrinsic back muscles. There is osteomyelitis of the L2, L3, and L4 spinous processes. No discitis or facet arthritis. 2. Left paravertebral abscess along the lower thoracic spine with small left empyema that is new from CT 2 days ago. 3. Distended bladder  CT - 1. Evidence of cellulitis involving the right side of the base of the neck extending into the right supraclavicular region with fluid and gas in the soft tissues at the base of the right side of the neck. 2. No significant abnormality of the thoracic spine. 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  CT -  1. Extensive abnormal fluid collection in the subcutaneous fat of the midline of the back with underlying marked abnormality of the posterior paraspinal musculature from L1-2 through S3. This is worrisome for subcutaneous abscess and myositis. 2. Moth-eaten appearance of the spinous processes of L3 and L4 consistent with osteomyelitis. 3. No discrete epidural abscess. However, the abnormal edema in the paraspinal musculature extends to the posterior aspect of the spinal canal at L3-4. 4. MRI with and without contrast may better define the extent of the soft tissue and infection and could detect epidural extension that is not apparent on this unenhanced CT scan.  9/22>> PICC line  I&D by [facility] of back soft tissue abscess on -  03/22/18    CONSULTS: N.Surgery, ID, general surgery, PCCM  Time spent: 25- minutes-Greater than 50% of this time was spent in counseling, explanation of diagnosis, planning of further management, and coordination of care.  MEDICATIONS: Scheduled Meds: . aspirin  81 mg Oral Daily  . atorvastatin  40 mg Oral q1800  . buprenorphine-naloxone  2 tablet Sublingual Daily  . feeding supplement (PRO-STAT SUGAR FREE 64)  30 mL Oral TID WC  .  gabapentin  300 mg Oral TID  . heparin injection (subcutaneous)  5,000 Units Subcutaneous Q8H  . insulin aspart  0-5 Units Subcutaneous QHS  . insulin aspart  0-9 Units Subcutaneous TID WC  . insulin aspart  4 Units Subcutaneous TID WC  . insulin glargine  25 Units Subcutaneous QHS  . metoprolol tartrate  50 mg Oral BID  . nicotine  21 mg Transdermal Daily  . polyethylene glycol  17 g Oral BID  . senna-docusate  2 tablet Oral QHS  . tamsulosin  0.4 mg Oral Daily   Continuous Infusions: . nafcillin IV 2 g (04/04/18 0924)   PRN Meds:.acetaminophen, bisacodyl, clonazepam, cyclobenzaprine, hydrALAZINE, LORazepam, magnesium hydroxide, metoprolol tartrate, nitroGLYCERIN, sodium chloride flush, sodium phosphate   PHYSICAL EXAM: Vital signs: Vitals:   04/03/18 0557 04/03/18 1519 04/03/18 2109 04/04/18 0559  BP: 135/81  133/71 139/82 (!) 143/91  Pulse: (!) 111 85 83 78  Resp:  20 17 17   Temp: 98.1 F (36.7 C) 98.6 F (37 C) 98.7 F (37.1 C) 98.4 F (36.9 C)  TempSrc: Oral Oral Oral Oral  SpO2: 97% 93% 95% 95%  Weight:      Height:       Filed Weights   03/21/18 0602  Weight: 68 kg   Body mass index is 20.92 kg/m.   Exam  Awake Alert, Oriented X 3, No new F.N deficits, Normal affect [place].AT,PERRAL Supple Neck,No JVD, No cervical lymphadenopathy appriciated.  Symmetrical Chest wall movement, Good air movement bilaterally, CTAB RRR,No Gallops, Rubs or new Murmurs, No Parasternal Heave +ve B.Sounds, Abd Soft, No tenderness, No organomegaly appriciated, No rebound - guarding or rigidity. No Cyanosis, Clubbing or edema, No new Rash or bruise L Leg 5/5  I have personally reviewed following labs and imaging studies  LABORATORY DATA: CBC: Recent Labs  Lab 03/30/18 0227 03/30/18 1837 03/31/18 0336 04/02/18 0625  WBC 14.5* 25.2* 24.8* 22.2*  HGB 9.2* 7.6* 7.5* 9.1*  HCT 28.6* 24.3* 23.7* 28.7*  MCV 95.7 98.4 97.1 96.6  PLT 382 365 358 485*    Basic Metabolic  Panel: Recent Labs  Lab 03/30/18 0227 03/31/18 0336 04/01/18 0311 04/02/18 0625 04/04/18 0634  NA 135 135 134* 140 138  K 2.8* 3.7 3.5 3.6 3.0*  CL 90* 94* 98 97* 100  CO2 34* 31 30 30  32  GLUCOSE 230* 266* 305* 360* 258*  BUN 6 9 8 15 10   CREATININE 0.68 0.62 0.58* 0.76 0.66  CALCIUM 8.5* 7.7* 7.7* 8.4* 7.7*  MG 1.6* 2.0 1.9 2.0 1.6*  PHOS  --  2.7  --   --   --     GFR: Estimated Creatinine Clearance: 116.9 mL/min (by C-G formula based on SCr of 0.66 mg/dL).  Liver Function Tests: Recent Labs  Lab 04/02/18 0625  AST 22  ALT 14  ALKPHOS 140*  BILITOT 0.7  PROT 6.5  ALBUMIN 1.5*   No results for input(s): LIPASE, AMYLASE in the last 168 hours. No results for input(s): AMMONIA in the last 168 hours.  Coagulation Profile: No results for input(s): INR, PROTIME in the last 168 hours.  Cardiac Enzymes: Recent Labs  Lab 03/30/18 0219 03/30/18 1222 03/30/18 1620  TROPONINI <0.03 <0.03 <0.03    BNP (last 3 results) No results for input(s): PROBNP in the last 8760 hours.  HbA1C: No results for input(s): HGBA1C in the last 72 hours.  CBG: Recent Labs  Lab 04/03/18 0758 04/03/18 1221 04/03/18 1936 04/03/18 2109 04/04/18 0809  GLUCAP 475* 416* 305* 271* 213*    Lipid Profile: No results for input(s): CHOL, HDL, LDLCALC, TRIG, CHOLHDL, LDLDIRECT in the last 72 hours.  Thyroid Function Tests: No results for input(s): TSH, T4TOTAL, FREET4, T3FREE, THYROIDAB in the last 72 hours.  Anemia Panel: Recent Labs    04/01/18 1237 04/01/18 1238  VITAMINB12 886  --   FOLATE  --  9.5  FERRITIN 583*  --   TIBC 157*  --   IRON 31*  --   RETICCTPCT 1.9  --     Urine analysis:    Component Value Date/Time   COLORURINE YELLOW 03/30/2018 1930   APPEARANCEUR CLEAR 03/30/2018 1930   LABSPEC 1.009 03/30/2018 1930   PHURINE 6.0 03/30/2018 1930   GLUCOSEU 50 (A) 03/30/2018 1930   HGBUR SMALL (A) 03/30/2018 1930   BILIRUBINUR NEGATIVE 03/30/2018 1930  BILIRUBINUR small 02/26/2018 1202   KETONESUR NEGATIVE 03/30/2018 1930   PROTEINUR NEGATIVE 03/30/2018 1930   UROBILINOGEN 1.0 02/26/2018 1202   UROBILINOGEN 1.0 12/11/2016 1020   NITRITE NEGATIVE 03/30/2018 1930   LEUKOCYTESUR TRACE (A) 03/30/2018 1930    Sepsis Labs: Lactic Acid, Venous    Component Value Date/Time   LATICACIDVEN 1.2 03/30/2018 1837    MICROBIOLOGY: Recent Results (from the past 240 hour(s))  Culture, blood (single)     Status: None (Preliminary result)   Collection Time: 03/30/18  4:12 PM  Result Value Ref Range Status   Specimen Description BLOOD RIGHT ANTECUBITAL  Final   Special Requests   Final    BOTTLES DRAWN AEROBIC ONLY Blood Culture adequate volume   Culture   Final    NO GROWTH 4 DAYS Performed at [name], [address] [place], [place] [address]    Report Status PENDING  Incomplete  Culture, Urine     Status: None   Collection Time: 03/30/18  4:20 PM  Result Value Ref Range Status   Specimen Description URINE, CATHETERIZED  Final   Special Requests NONE  Final   Culture   Final    NO GROWTH Performed at [facility], [address], [place], [place] [address]    Report Status 04/01/2018 FINAL  Final  Culture, blood (Routine X 2) w Reflex to ID Panel     Status: None (Preliminary result)   Collection Time: 04/01/18  9:49 AM  Result Value Ref Range Status   Specimen Description BLOOD BLOOD RIGHT HAND  Final   Special Requests   Final    BOTTLES DRAWN AEROBIC ONLY Blood Culture results may not be optimal due to an inadequate volume of blood received in culture bottles   Culture   Final    NO GROWTH 2 DAYS Performed at [name], [address], [place], [place] [address]    Report Status PENDING  Incomplete  Culture, blood (Routine X 2) w Reflex to ID Panel     Status: None (Preliminary result)   Collection Time: 04/01/18 10:02 AM  Result Value Ref Range Status   Specimen Description BLOOD RIGHT ANTECUBITAL   Final   Special Requests   Final    BOTTLES DRAWN AEROBIC ONLY Blood Culture adequate volume   Culture   Final    NO GROWTH 2 DAYS Performed at [facility], [address], [place], [place] [address]    Report Status PENDING  Incomplete    RADIOLOGY STUDIES/RESULTS: Ct Angio Chest Pe W Or Wo Contrast  Result Date: 03/30/2018 CLINICAL DATA:  Shortness of breath PE suspected, high pretest prob. Patient with known epidural abscess and endocarditis. Now with left-sided chest pain. EXAM: CT ANGIOGRAPHY CHEST WITH CONTRAST TECHNIQUE: Multidetector CT imaging of the chest was performed using the standard protocol during bolus administration of intravenous contrast. Multiplanar CT image reconstructions and MIPs were obtained to evaluate the vascular anatomy. CONTRAST:  54mL ISOVUE-370 IOPAMIDOL (ISOVUE-370) INJECTION 76% COMPARISON:  Chest radiographs earlier this day. FINDINGS: Cardiovascular: There are no filling defects within the pulmonary arteries to suggest pulmonary embolus. The thoracic aorta is normal in caliber without dissection. Mild cardiomegaly. Small amount pericardial fluid. Mediastinum/Nodes: Loculated left pleural effusion tracks along the left aspect of the mediastinum. Small left hilar nodes without bulky adenopathy. No enlarged mediastinal nodes. No dominant thyroid nodule. The esophagus is decompressed. Lungs/Pleura: Moderate left pleural effusion is loculated and tracks along the posterior,  lateral, and medial hemithorax. Adjacent airspace disease in the left lower lobe, favoring compressive atelectasis, with adjacent compressive atelectasis in the right upper lobe. Pleural thickening versus loculated pleural effusion the upper medial right hemithorax, with additional areas of pleural thickening dependently. Dependent atelectasis in the right lower lobe. Trachea and bronchi are patent. Upper Abdomen: No acute findings. Musculoskeletal: Patient with known epidural abscess, not well  delineated by CT. No bony destructive change. Review of the MIP images confirms the above findings. IMPRESSION: 1. No pulmonary embolus. 2. Moderate loculated left pleural effusion. Adjacent airspace disease likely compressive atelectasis, pneumonia not excluded in the setting of spinal infection and endocarditis. Mild right pleural thickening or loculated pleural fluid in the upper right hemithorax. Electronically Signed   By: Narda Rutherford M.D.   On: 03/30/2018 23:21   Ct Thoracic Spine Wo Contrast  Result Date: 03/20/2018 CLINICAL DATA:  Progressive back pain.  Swelling of the lower back. EXAM: CT THORACIC SPINE WITHOUT CONTRAST TECHNIQUE: Multidetector CT images of the thoracic were obtained using the standard protocol without intravenous contrast. COMPARISON:  None. FINDINGS: Alignment: Normal. 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 Paraspinal and other soft tissues: There is abnormal gas and fluid in the soft tissues of the right side of the base of the neck and in the right supraclavicular region. Paraspinal soft tissues appear normal throughout the thoracic spine. Disc levels: There is no evidence of disc protrusion or significant disc bulging or spinal or foraminal stenosis or other significant abnormality of the thoracic spine. IMPRESSION: 1. Evidence of cellulitis involving the right side of the base of the neck extending into the right supraclavicular region with fluid and gas in the soft tissues at the base of the right side of the neck. 2. No significant abnormality of the thoracic spine. 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 Electronically Signed   By: Francene Boyers M.D.   On: 03/20/2018 11:25   Ct Lumbar Spine Wo Contrast  Addendum Date: 03/20/2018   ADDENDUM REPORT: 03/20/2018 11:44 ADDENDUM: Critical Value/emergent results were called by telephone at the time of interpretation on 03/20/2018 at 11:30 am to Dr. Sharyn Creamer , who verbally acknowledged these results. Electronically  Signed   By: Francene Boyers M.D.   On: 03/20/2018 11:44   Result Date: 03/20/2018 CLINICAL DATA:  Increasing low back pain and soft tissue swelling. EXAM: CT LUMBAR SPINE WITHOUT CONTRAST TECHNIQUE: Multidetector CT imaging of the lumbar spine was performed without intravenous contrast administration. Multiplanar CT image reconstructions were also generated. IV contrast could not be utilized due to the lack of an appropriate IV. COMPARISON:  None. FINDINGS: Segmentation: 5 lumbar type vertebrae. Alignment: Normal. Vertebrae: There is a moth-eaten appearance of the spinous processes of L3 and L4 which is worrisome for osteomyelitis. Bilateral pars defects at L5 with grade 1 spondylolisthesis. 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 Paraspinal and other soft tissues: There is an extensive abnormal fluid collection in the subcutaneous soft tissues of the posterior aspect of the back extending from approximately L1-2 to S3. This fluid collection is lobulated and measures approximately 20 x 9 x 2.5 cm. It is centered slightly to the left of midline and has a mass effect upon the adjacent posterior paraspinal muscles. There is abnormal lucency in the underlying paraspinal muscles which could represent myositis. Disc levels: T11-12: No significant abnormality. Butterfly T12 vertebra. T12-L1: No significant abnormality. L1-2: Normal disc. Abnormal edema in the posterior paraspinal musculature with adjacent fluid collection in the subcutaneous fat of the posterior  aspect of the back as described above. L2-3: Normal disc. L3-4: Normal disc. Lucency in the posterior paraspinal soft tissues extends to the posterior aspect of the thecal sac on image 80 of series 4 but there is no discrete epidural abscess. L4-5: Normal disc.  No evidence of epidural abscess. L5-S1: Grade 1 spondylolisthesis. No disc bulging or protrusion. Bilateral pars defects. No visible epidural abscess. IMPRESSION: 1. Extensive abnormal fluid collection in  the subcutaneous fat of the midline of the back with underlying marked abnormality of the posterior paraspinal musculature from L1-2 through S3. This is worrisome for subcutaneous abscess and myositis. 2. Moth-eaten appearance of the spinous processes of L3 and L4 consistent with osteomyelitis. 3. No discrete epidural abscess. However, the abnormal edema in the paraspinal musculature extends to the posterior aspect of the spinal canal at L3-4. 4. MRI with and without contrast may better define the extent of the soft tissue and infection and could detect epidural extension that is not apparent on this unenhanced CT scan. Electronically Signed: By: Francene Boyers M.D. On: 03/20/2018 11:18   Mr Cervical Spine Wo Contrast  Result Date: 03/30/2018 CLINICAL DATA:  42 year old male with history of MSSA bacteremia with known endocarditis and epidural abscess extending from C2 through the sacrum. Recent episode of hypotension and acute lower extremity weakness. Evaluate abscess and possible spinal cord infarct. EXAM: MRI CERVICAL, THORACIC SPINE WITHOUT CONTRAST TECHNIQUE: Multiplanar and multiecho pulse sequences of the cervical spine, to include the craniocervical junction and cervicothoracic junction, and thoracic and lumbar spine, were obtained without intravenous contrast. COMPARISON:  Prior MRI from 03/22/2018. FINDINGS: MRI CERVICAL SPINE FINDINGS Alignment: Examination technically limited as the patient was unable to tolerate the full length of the exam. Additionally, images provided are degraded by motion artifact. Reversal of the normal cervical lordosis with apex at C5, stable. No interval listhesis or malalignment. Vertebrae: Vertebral body height maintained without acute or interval fracture. Bone marrow signal intensity diffusely decreased on T1 weighted imaging, suspected to be related to anemia and chronic disease. No discrete osseous lesions. No abnormal marrow edema. No evidence for interval discitis.  Cord: Signal intensity within the cervical spinal cord is within normal limits. No findings to suggest interval cord infarction on this motion degraded and limited exam. Previously identified epidural collection involving the ventral epidural space extending from C2 inferiorly is decreased in size, now measuring up to 7 mm in maximal AP diameter at the level of C2. Collection diffusely involves the ventral epidural space, but also is seen dorsally as well. Slightly improved diffuse spinal stenosis with thecal sac patency. Posterior Fossa, vertebral arteries, paraspinal tissues: Visualized brain and posterior fossa within normal limits. Craniocervical junction normal. Scattered edema within the posterior paraspinous soft tissues. Normal intravascular flow voids seen within the vertebral arteries bilaterally. Disc levels: C2-C3: Unremarkable. C3-C4: Small left foraminal protrusion with associated moderate left C4 foraminal stenosis. C4-C5:  Unremarkable. C5-C6: Left eccentric disc bulge with uncovertebral hypertrophy. Moderate left C6 foraminal narrowing. C6-C7: Right foraminal disc protrusion with associated moderate right C7 foraminal stenosis. C7-T1:  Unremarkable. MRI THORACIC SPINE FINDINGS Alignment: Examination markedly limited as the patient was unable to tolerate the full length of the exam. Sagittal T1, T2, and STIR sequences only were performed. No axial images obtained. Vertebral bodies normally aligned with preservation of the normal thoracic kyphosis. Vertebrae: Vertebral body height maintained without evidence for interval fracture. Butterfly vertebra noted at T12. Diffusely decreased T1 weighted signal intensity throughout the visualized bone marrow, like related to  anemia chronic disease. No findings to suggest interval or new discitis or septic arthritis. Cord: Signal intensity within the thoracic spinal cord grossly within normal limits on these limited sagittal views. No definite cord signal  abnormality or cord edema to suggest acute spinal cord infarction. Previously seen diffuse epidural collection appears overall decreased in size from previous, with improved spinal stenosis and thecal sac patency. Paraspinal and other soft tissues: Scattered edema seen within the posterior paraspinous soft tissues at the upper back/cervicothoracic junction. No discrete soft tissue collections. Disc levels: T5-6: Central disc protrusion indenting upon the ventral thoracic spinal cord, stable. IMPRESSION: 1. Technically limited exam due to motion artifact and the patient's inability to tolerate the full length of the exam. Cervical and thoracic spine only was imaged, and only sagittal sequences of the thoracic spine were obtained. 2. No imaging findings to suggest spinal cord infarction identified on this limited exam. 3. Interval improvement in diffuse epidural collection, decreased in size as compared to previous exam with improved diffuse spinal stenosis and thecal sac patency. No new discitis or facet arthritis. Electronically Signed   By: Rise Mu M.D.   On: 03/30/2018 22:52   Mr Thoracic Spine Wo Contrast  Result Date: 03/30/2018 CLINICAL DATA:  42 year old male with history of MSSA bacteremia with known endocarditis and epidural abscess extending from C2 through the sacrum. Recent episode of hypotension and acute lower extremity weakness. Evaluate abscess and possible spinal cord infarct. EXAM: MRI CERVICAL, THORACIC SPINE WITHOUT CONTRAST TECHNIQUE: Multiplanar and multiecho pulse sequences of the cervical spine, to include the craniocervical junction and cervicothoracic junction, and thoracic and lumbar spine, were obtained without intravenous contrast. COMPARISON:  Prior MRI from 03/22/2018. FINDINGS: MRI CERVICAL SPINE FINDINGS Alignment: Examination technically limited as the patient was unable to tolerate the full length of the exam. Additionally, images provided are degraded by motion  artifact. Reversal of the normal cervical lordosis with apex at C5, stable. No interval listhesis or malalignment. Vertebrae: Vertebral body height maintained without acute or interval fracture. Bone marrow signal intensity diffusely decreased on T1 weighted imaging, suspected to be related to anemia and chronic disease. No discrete osseous lesions. No abnormal marrow edema. No evidence for interval discitis. Cord: Signal intensity within the cervical spinal cord is within normal limits. No findings to suggest interval cord infarction on this motion degraded and limited exam. Previously identified epidural collection involving the ventral epidural space extending from C2 inferiorly is decreased in size, now measuring up to 7 mm in maximal AP diameter at the level of C2. Collection diffusely involves the ventral epidural space, but also is seen dorsally as well. Slightly improved diffuse spinal stenosis with thecal sac patency. Posterior Fossa, vertebral arteries, paraspinal tissues: Visualized brain and posterior fossa within normal limits. Craniocervical junction normal. Scattered edema within the posterior paraspinous soft tissues. Normal intravascular flow voids seen within the vertebral arteries bilaterally. Disc levels: C2-C3: Unremarkable. C3-C4: Small left foraminal protrusion with associated moderate left C4 foraminal stenosis. C4-C5:  Unremarkable. C5-C6: Left eccentric disc bulge with uncovertebral hypertrophy. Moderate left C6 foraminal narrowing. C6-C7: Right foraminal disc protrusion with associated moderate right C7 foraminal stenosis. C7-T1:  Unremarkable. MRI THORACIC SPINE FINDINGS Alignment: Examination markedly limited as the patient was unable to tolerate the full length of the exam. Sagittal T1, T2, and STIR sequences only were performed. No axial images obtained. Vertebral bodies normally aligned with preservation of the normal thoracic kyphosis. Vertebrae: Vertebral body height maintained  without evidence for interval fracture. Butterfly vertebra  noted at T12. Diffusely decreased T1 weighted signal intensity throughout the visualized bone marrow, like related to anemia chronic disease. No findings to suggest interval or new discitis or septic arthritis. Cord: Signal intensity within the thoracic spinal cord grossly within normal limits on these limited sagittal views. No definite cord signal abnormality or cord edema to suggest acute spinal cord infarction. Previously seen diffuse epidural collection appears overall decreased in size from previous, with improved spinal stenosis and thecal sac patency. Paraspinal and other soft tissues: Scattered edema seen within the posterior paraspinous soft tissues at the upper back/cervicothoracic junction. No discrete soft tissue collections. Disc levels: T5-6: Central disc protrusion indenting upon the ventral thoracic spinal cord, stable. IMPRESSION: 1. Technically limited exam due to motion artifact and the patient's inability to tolerate the full length of the exam. Cervical and thoracic spine only was imaged, and only sagittal sequences of the thoracic spine were obtained. 2. No imaging findings to suggest spinal cord infarction identified on this limited exam. 3. Interval improvement in diffuse epidural collection, decreased in size as compared to previous exam with improved diffuse spinal stenosis and thecal sac patency. No new discitis or facet arthritis. Electronically Signed   By: Rise Mu M.D.   On: 03/30/2018 22:52   Mr Cervical Spine W Wo Contrast  Result Date: 03/22/2018 CLINICAL DATA:  Spine infection. EXAM: MRI TOTAL SPINE WITHOUT AND WITH CONTRAST TECHNIQUE: Multisequence MR imaging of the spine from the cervical spine to the sacrum was performed prior to and following IV contrast administration. CONTRAST:  6 cc Gadavist intravenous COMPARISON:  CT of the thoracic and lumbar spine from 2 days ago FINDINGS: MRI CERVICAL SPINE  FINDINGS Alignment: Normal Vertebrae: No evidence of osseous infection. Canal/Cord: There is extensive spinal fluid collection preferentially in the ventral but also in the right more than left dorsal canal. Subarachnoid space is diffusely effaced. Maximal thickness is posterior to C2 at 9 mm. No cord signal abnormality. Posterior Fossa, vertebral arteries, paraspinal tissues: No retropharyngeal or other discrete soft tissue collection. Disc levels: C2-3: Unremarkable. C3-4: Small left foraminal protrusion with moderate narrowing C4-5: Unremarkable. C5-6: Disc narrowing and bulging with asymmetric left uncovertebral spurring. Left foraminal impingement C6-7: Right foraminal protrusion mild narrowing. C7-T1:Unremarkable. MRI THORACIC SPINE FINDINGS Alignment:  Normal Vertebrae: No evidence of osteomyelitis or discitis. T12 butterfly vertebra. Canal/Cord: Cervical ventral epidural collection continues throughout the thoracic levels. There is also a focal dorsal component at T3-4 to T5-6, where thecal sac effacement is accentuated and there is cord flattening. No cord edema. Paraspinal and other soft tissues: Paraspinous phlegmon on the left at T8-T12, with new complex left pleural effusion and lower lobe atelectasis Disc levels: T5-6 central disc protrusion. MRI LUMBAR SPINE FINDINGS Segmentation:  5 lumbar type vertebral bodies Alignment:  Grade 1 anterolisthesis at L5-S1. Vertebrae: Marrow edema and heterogeneous enhancement within the L2, L3, and L4 spinous processes. No discitis or facet edema. Conus medullaris: Extends to the L1 level and is non edematous. There is extensive epidural collection completely effacing the thecal sac throughout the lumbar spine until L4-5 and below where the collection becomes ventral and right eccentric. The infection communicates with extensive bilateral abscess within the intrinsic back muscles via the interspinous space at L3-4. Patient had recent subcutaneous collection and left  buttocks collection drainage, with packing seen in place. This midline, upper subcutaneous collection communicates with the paravertebral abscess along its superior margin based on postcontrast axial images. Paraspinal and other soft tissues: As above.  Distended  bladder Disc levels: Chronic bilateral pars defects at L5. Critical Value/emergent results were called by telephone at the time of interpretation on 03/22/2018 at 3:04 pm to Dr. Thedore Mins , who verbally acknowledged these results. IMPRESSION: 1. Epidural abscess from C2 to sacrum as described. Maximal cord compression from T3-4 to T5-6. The thecal sac is completely effaced from L1 to L4-5. At the L3-4 interspinous space the spinal abscess communicates with large bilateral abscesses within the intrinsic back muscles. There is osteomyelitis of the L2, L3, and L4 spinous processes. No discitis or facet arthritis. 2. Left paravertebral abscess along the lower thoracic spine with small left empyema that is new from CT 2 days ago. 3. Distended bladder Electronically Signed   By: Marnee Spring M.D.   On: 03/22/2018 15:11   Mr Thoracic Spine W Wo Contrast  Result Date: 03/22/2018 CLINICAL DATA:  Spine infection. EXAM: MRI TOTAL SPINE WITHOUT AND WITH CONTRAST TECHNIQUE: Multisequence MR imaging of the spine from the cervical spine to the sacrum was performed prior to and following IV contrast administration. CONTRAST:  6 cc Gadavist intravenous COMPARISON:  CT of the thoracic and lumbar spine from 2 days ago FINDINGS: MRI CERVICAL SPINE FINDINGS Alignment: Normal Vertebrae: No evidence of osseous infection. Canal/Cord: There is extensive spinal fluid collection preferentially in the ventral but also in the right more than left dorsal canal. Subarachnoid space is diffusely effaced. Maximal thickness is posterior to C2 at 9 mm. No cord signal abnormality. Posterior Fossa, vertebral arteries, paraspinal tissues: No retropharyngeal or other discrete soft tissue  collection. Disc levels: C2-3: Unremarkable. C3-4: Small left foraminal protrusion with moderate narrowing C4-5: Unremarkable. C5-6: Disc narrowing and bulging with asymmetric left uncovertebral spurring. Left foraminal impingement C6-7: Right foraminal protrusion mild narrowing. C7-T1:Unremarkable. MRI THORACIC SPINE FINDINGS Alignment:  Normal Vertebrae: No evidence of osteomyelitis or discitis. T12 butterfly vertebra. Canal/Cord: Cervical ventral epidural collection continues throughout the thoracic levels. There is also a focal dorsal component at T3-4 to T5-6, where thecal sac effacement is accentuated and there is cord flattening. No cord edema. Paraspinal and other soft tissues: Paraspinous phlegmon on the left at T8-T12, with new complex left pleural effusion and lower lobe atelectasis Disc levels: T5-6 central disc protrusion. MRI LUMBAR SPINE FINDINGS Segmentation:  5 lumbar type vertebral bodies Alignment:  Grade 1 anterolisthesis at L5-S1. Vertebrae: Marrow edema and heterogeneous enhancement within the L2, L3, and L4 spinous processes. No discitis or facet edema. Conus medullaris: Extends to the L1 level and is non edematous. There is extensive epidural collection completely effacing the thecal sac throughout the lumbar spine until L4-5 and below where the collection becomes ventral and right eccentric. The infection communicates with extensive bilateral abscess within the intrinsic back muscles via the interspinous space at L3-4. Patient had recent subcutaneous collection and left buttocks collection drainage, with packing seen in place. This midline, upper subcutaneous collection communicates with the paravertebral abscess along its superior margin based on postcontrast axial images. Paraspinal and other soft tissues: As above.  Distended bladder Disc levels: Chronic bilateral pars defects at L5. Critical Value/emergent results were called by telephone at the time of interpretation on 03/22/2018 at  3:04 pm to Dr. Thedore Mins , who verbally acknowledged these results. IMPRESSION: 1. Epidural abscess from C2 to sacrum as described. Maximal cord compression from T3-4 to T5-6. The thecal sac is completely effaced from L1 to L4-5. At the L3-4 interspinous space the spinal abscess communicates with large bilateral abscesses within the intrinsic back muscles. There is  osteomyelitis of the L2, L3, and L4 spinous processes. No discitis or facet arthritis. 2. Left paravertebral abscess along the lower thoracic spine with small left empyema that is new from CT 2 days ago. 3. Distended bladder Electronically Signed   By: Marnee Spring M.D.   On: 03/22/2018 15:11   Mr Lumbar Spine W Wo Contrast  Result Date: 03/22/2018 CLINICAL DATA:  Spine infection. EXAM: MRI TOTAL SPINE WITHOUT AND WITH CONTRAST TECHNIQUE: Multisequence MR imaging of the spine from the cervical spine to the sacrum was performed prior to and following IV contrast administration. CONTRAST:  6 cc Gadavist intravenous COMPARISON:  CT of the thoracic and lumbar spine from 2 days ago FINDINGS: MRI CERVICAL SPINE FINDINGS Alignment: Normal Vertebrae: No evidence of osseous infection. Canal/Cord: There is extensive spinal fluid collection preferentially in the ventral but also in the right more than left dorsal canal. Subarachnoid space is diffusely effaced. Maximal thickness is posterior to C2 at 9 mm. No cord signal abnormality. Posterior Fossa, vertebral arteries, paraspinal tissues: No retropharyngeal or other discrete soft tissue collection. Disc levels: C2-3: Unremarkable. C3-4: Small left foraminal protrusion with moderate narrowing C4-5: Unremarkable. C5-6: Disc narrowing and bulging with asymmetric left uncovertebral spurring. Left foraminal impingement C6-7: Right foraminal protrusion mild narrowing. C7-T1:Unremarkable. MRI THORACIC SPINE FINDINGS Alignment:  Normal Vertebrae: No evidence of osteomyelitis or discitis. T12 butterfly vertebra.  Canal/Cord: Cervical ventral epidural collection continues throughout the thoracic levels. There is also a focal dorsal component at T3-4 to T5-6, where thecal sac effacement is accentuated and there is cord flattening. No cord edema. Paraspinal and other soft tissues: Paraspinous phlegmon on the left at T8-T12, with new complex left pleural effusion and lower lobe atelectasis Disc levels: T5-6 central disc protrusion. MRI LUMBAR SPINE FINDINGS Segmentation:  5 lumbar type vertebral bodies Alignment:  Grade 1 anterolisthesis at L5-S1. Vertebrae: Marrow edema and heterogeneous enhancement within the L2, L3, and L4 spinous processes. No discitis or facet edema. Conus medullaris: Extends to the L1 level and is non edematous. There is extensive epidural collection completely effacing the thecal sac throughout the lumbar spine until L4-5 and below where the collection becomes ventral and right eccentric. The infection communicates with extensive bilateral abscess within the intrinsic back muscles via the interspinous space at L3-4. Patient had recent subcutaneous collection and left buttocks collection drainage, with packing seen in place. This midline, upper subcutaneous collection communicates with the paravertebral abscess along its superior margin based on postcontrast axial images. Paraspinal and other soft tissues: As above.  Distended bladder Disc levels: Chronic bilateral pars defects at L5. Critical Value/emergent results were called by telephone at the time of interpretation on 03/22/2018 at 3:04 pm to Dr. Thedore Mins , who verbally acknowledged these results. IMPRESSION: 1. Epidural abscess from C2 to sacrum as described. Maximal cord compression from T3-4 to T5-6. The thecal sac is completely effaced from L1 to L4-5. At the L3-4 interspinous space the spinal abscess communicates with large bilateral abscesses within the intrinsic back muscles. There is osteomyelitis of the L2, L3, and L4 spinous processes. No  discitis or facet arthritis. 2. Left paravertebral abscess along the lower thoracic spine with small left empyema that is new from CT 2 days ago. 3. Distended bladder Electronically Signed   By: Marnee Spring M.D.   On: 03/22/2018 15:11   Dg Chest Port 1 View  Result Date: 03/30/2018 CLINICAL DATA:  Shortness of breath EXAM: PORTABLE CHEST 1 VIEW COMPARISON:  03/30/2018 at 0227 hours FINDINGS: Moderate  layering left pleural effusion, increased. Left lower lobe opacity, atelectasis versus pneumonia. Right lung is clear.  No pneumothorax. The heart is normal in size. IMPRESSION: Moderate layering left pleural effusion, increased. Left lower lobe opacity, atelectasis versus pneumonia. Electronically Signed   By: Charline Bills M.D.   On: 03/30/2018 19:06   Dg Chest Port 1 View  Result Date: 03/30/2018 CLINICAL DATA:  Chest pain EXAM: PORTABLE CHEST 1 VIEW COMPARISON:  None. FINDINGS: Retrocardiac opacity, atelectasis versus pneumonia. Possible small left pleural effusion. Right lung is clear. No pneumothorax. The heart is normal in size. IMPRESSION: Retrocardiac opacity, atelectasis versus pneumonia. Possible small left pleural effusion. Electronically Signed   By: Charline Bills M.D.   On: 03/30/2018 02:49   [place] Ekg Site Rite  Result Date: 04/04/2018 If Site Rite image not attached, placement could not be confirmed due to current cardiac rhythm.  [place] Ekg Site Rite  Result Date: 03/21/2018 If Site Rite image not attached, placement could not be confirmed due to current cardiac rhythm.    LOS: 15 days   Signature  Susa Raring M.D on 04/04/2018 at 10:19 AM  To page go to [email or web address] - password [facility]

## 2018-04-05 LAB — BASIC METABOLIC PANEL
ANION GAP: 5 (ref 5–15)
BUN: <5 mg/dL — ABNORMAL LOW (ref 6–20)
CALCIUM: 6.6 mg/dL — AB (ref 8.9–10.3)
CO2: 33 mmol/L — ABNORMAL HIGH (ref 22–32)
Chloride: 98 mmol/L (ref 98–111)
Creatinine, Ser: 0.49 mg/dL — ABNORMAL LOW (ref 0.61–1.24)
Glucose, Bld: 169 mg/dL — ABNORMAL HIGH (ref 70–99)
POTASSIUM: 2.4 mmol/L — AB (ref 3.5–5.1)
SODIUM: 136 mmol/L (ref 135–145)

## 2018-04-05 LAB — GLUCOSE, CAPILLARY
GLUCOSE-CAPILLARY: 156 mg/dL — AB (ref 70–99)
Glucose-Capillary: 117 mg/dL — ABNORMAL HIGH (ref 70–99)
Glucose-Capillary: 226 mg/dL — ABNORMAL HIGH (ref 70–99)
Glucose-Capillary: 229 mg/dL — ABNORMAL HIGH (ref 70–99)

## 2018-04-05 LAB — CBC
HCT: 22.2 % — ABNORMAL LOW (ref 39.0–52.0)
Hemoglobin: 6.7 g/dL — CL (ref 13.0–17.0)
MCH: 29.8 pg (ref 26.0–34.0)
MCHC: 30.2 g/dL (ref 30.0–36.0)
MCV: 98.7 fL (ref 78.0–100.0)
PLATELETS: 306 10*3/uL (ref 150–400)
RBC: 2.25 MIL/uL — AB (ref 4.22–5.81)
RDW: 13.4 % (ref 11.5–15.5)
WBC: 10.2 10*3/uL (ref 4.0–10.5)

## 2018-04-05 LAB — HEMOGLOBIN AND HEMATOCRIT, BLOOD
HCT: 23.3 % — ABNORMAL LOW (ref 39.0–52.0)
Hemoglobin: 7.3 g/dL — ABNORMAL LOW (ref 13.0–17.0)

## 2018-04-05 LAB — MAGNESIUM: MAGNESIUM: 1.5 mg/dL — AB (ref 1.7–2.4)

## 2018-04-05 MED ORDER — POTASSIUM CHLORIDE CRYS ER 20 MEQ PO TBCR
40.0000 meq | EXTENDED_RELEASE_TABLET | Freq: Once | ORAL | Status: AC
  Administered 2018-04-05: 40 meq via ORAL
  Filled 2018-04-05: qty 2

## 2018-04-05 MED ORDER — POTASSIUM CHLORIDE 10 MEQ/100ML IV SOLN
10.0000 meq | INTRAVENOUS | Status: AC
  Administered 2018-04-05 – 2018-04-06 (×5): 10 meq via INTRAVENOUS
  Filled 2018-04-05 (×3): qty 100

## 2018-04-05 MED ORDER — POTASSIUM CHLORIDE 10 MEQ/100ML IV SOLN
10.0000 meq | INTRAVENOUS | Status: AC
  Administered 2018-04-05: 10 meq via INTRAVENOUS
  Filled 2018-04-05 (×2): qty 100

## 2018-04-05 MED ORDER — MAGNESIUM SULFATE 2 GM/50ML IV SOLN
2.0000 g | Freq: Once | INTRAVENOUS | Status: AC
  Administered 2018-04-05: 2 g via INTRAVENOUS
  Filled 2018-04-05: qty 50

## 2018-04-05 MED ORDER — POTASSIUM CHLORIDE CRYS ER 20 MEQ PO TBCR
40.0000 meq | EXTENDED_RELEASE_TABLET | Freq: Once | ORAL | Status: AC
  Administered 2018-04-05: 40 meq via ORAL
  Filled 2018-04-05 (×2): qty 2

## 2018-04-05 NOTE — Progress Notes (Addendum)
Subjective: Patient reports "I'm ok"  Objective: Vital signs in last 24 hours: Temp:  [98.6 F (37 C)-100.5 F (38.1 C)] 98.6 F (37 C) (10/07 0559) Pulse Rate:  [80-87] 80 (10/07 0559) Resp:  [16] 16 (10/07 0559) BP: (119-130)/(74-77) 119/74 (10/07 0559) SpO2:  [91 %-95 %] 95 % (10/07 0559)  Intake/Output from previous day: 10/06 0701 - 10/07 0700 In: 118 [P.O.:118] Out: 2450 [Urine:2450] Intake/Output this shift: No intake/output data recorded.  Alert, conversant, denying pain or discomfort. Neuro exam stable: full strength all extremities without reported numbness.  IVAB continue per ID with plan to transition to po after the 18th. Lumbar wounds not assessed this visit; managed by Wound Therapy and general surgery.  Lab Results: No results for input(s): WBC, HGB, HCT, PLT in the last 72 hours. BMET Recent Labs    04/04/18 0634  NA 138  K 3.0*  CL 100  CO2 32  GLUCOSE 258*  BUN 10  CREATININE 0.66  CALCIUM 7.7*    Studies/Results: [place] Ekg Site Rite  Result Date: 04/04/2018 If Site Rite image not attached, placement could not be confirmed due to current cardiac rhythm.   Assessment/Plan:   LOS: 16 days  Supportive care continues.    Tony Long 04/05/2018, 8:06 AM  [name] stable

## 2018-04-05 NOTE — Progress Notes (Addendum)
14 Days Post-Op    CC:  MSSA back abscess  Subjective: I have a picture below, the top wound looks OK it has some undermining, but fairly clean. The lower abscess is a little soupy with some white non viable tissue around it.Both sites have about 1 - 1.5 cm of undermining.  I cleaned and repacked both, I will talk with the nurse about the packing..    Objective: Vital signs in last 24 hours: Temp:  [98.6 F (37 C)-100.5 F (38.1 C)] 98.6 F (37 C) (10/07 0559) Pulse Rate:  [80-87] 82 (10/07 0905) Resp:  [16] 16 (10/07 0559) BP: (119-130)/(74-82) 130/82 (10/07 0905) SpO2:  [91 %-95 %] 95 % (10/07 0559) Last BM Date: 04/03/18 PO 118 recorded Urine 2450 Afebrile, VSS K+ 2.4 Glucose 169 Mag 1.5 WBC 10.2 H/H 6.7/22.2   Intake/Output from previous day: 10/06 0701 - 10/07 0700 In: 118 [P.O.:118] Out: 2450 [Urine:2450] Intake/Output this shift: No intake/output data recorded.  General appearance: alert, cooperative and no distress Skin: Skin color, texture, turgor normal. No rashes or lesions or picture    Lab Results:  Recent Labs    04/05/18 0754  WBC 10.2  HGB 6.7*  HCT 22.2*  PLT 306    BMET Recent Labs    04/04/18 0634 04/05/18 0754  NA 138 136  K 3.0* 2.4*  CL 100 98  CO2 32 33*  GLUCOSE 258* 169*  BUN 10 <5*  CREATININE 0.66 0.49*  CALCIUM 7.7* 6.6*   PT/INR No results for input(s): LABPROT, INR in the last 72 hours.  Recent Labs  Lab 04/02/18 0625  AST 22  ALT 14  ALKPHOS 140*  BILITOT 0.7  PROT 6.5  ALBUMIN 1.5*     Lipase     Component Value Date/Time   LIPASE 15 03/20/2018 0759     Medications: . aspirin  81 mg Oral Daily  . atorvastatin  40 mg Oral q1800  . buprenorphine-naloxone  2 tablet Sublingual Daily  . feeding supplement (PRO-STAT SUGAR FREE 64)  30 mL Oral TID WC  . gabapentin  300 mg Oral TID  . heparin injection (subcutaneous)  5,000 Units Subcutaneous Q8H  . insulin aspart  0-5 Units Subcutaneous QHS  .  insulin aspart  0-9 Units Subcutaneous TID WC  . insulin aspart  4 Units Subcutaneous TID WC  . insulin glargine  25 Units Subcutaneous QHS  . metoprolol tartrate  50 mg Oral BID  . nicotine  21 mg Transdermal Daily  . polyethylene glycol  17 g Oral BID  . potassium chloride  40 mEq Oral Once  . potassium chloride  40 mEq Oral Once  . senna-docusate  2 tablet Oral QHS  . tamsulosin  0.4 mg Oral Daily   . magnesium sulfate 1 - 4 g bolus IVPB    . nafcillin IV 2 g (04/05/18 0538)  . potassium chloride     Anti-infectives (From admission, onward)   Start     Dose/Rate Route Frequency Ordered Stop   04/01/18 1300  nafcillin 2 g in sodium chloride 0.9 % 100 mL IVPB     2 g 200 mL/hr over 30 Minutes Intravenous Every 4 hours 04/01/18 1242     04/01/18 1200  nafcillin injection 2 g  Status:  Discontinued     2 g Intravenous Every 4 hours 04/01/18 1129 04/01/18 1242   03/31/18 0200  vancomycin (VANCOCIN) IVPB 750 mg/150 ml premix  Status:  Discontinued     750  mg 150 mL/hr over 60 Minutes Intravenous Every 8 hours 03/30/18 1628 04/01/18 1129   03/31/18 0100  meropenem (MERREM) 1 g in sodium chloride 0.9 % 100 mL IVPB  Status:  Discontinued     1 g 200 mL/hr over 30 Minutes Intravenous Every 8 hours 03/30/18 1628 04/01/18 1129   03/30/18 1630  vancomycin (VANCOCIN) 1,500 mg in sodium chloride 0.9 % 500 mL IVPB     1,500 mg 250 mL/hr over 120 Minutes Intravenous NOW 03/30/18 1619 03/30/18 1925   03/30/18 1630  meropenem (MERREM) 2 g in sodium chloride 0.9 % 100 mL IVPB     2 g 200 mL/hr over 30 Minutes Intravenous NOW 03/30/18 1619 03/30/18 1754   03/21/18 0930  ceFAZolin (ANCEF) IVPB 2g/100 mL premix  Status:  Discontinued     2 g 200 mL/hr over 30 Minutes Intravenous Every 8 hours 03/21/18 0920 03/30/18 1616      Assessment/Plan  T2DM on insulin - A1c 02/26/18 12.6  HTN Hx of IVDA - positive for cocaine and opiates on UDS H/ohepatitisC -MCV Ab >11,HCVRNA negative Code status  DNR  MSSA bacteremia- abx per primary team, ID Lumbar abscesses, POD13, s/p I&D9/23 Dr. Janee Morn -AV:[id number] aureuspan sensitive, anaerobic cx neg - drainage much better.  Hydrotherapy has signed off appropriately so as the wounds look much better with much less drainage. -ContinueTID dressing changesfor now - Will continue to see once weekly  Epidural abscess from C2-sacrum -per medicine and NS Endocarditis -per medicine  FEN: CM diet VTE: SCDs/heparin ID: ancef 9/21>>10/1, merrem/vancomycin 10/1>>10/3, nafcillin 10/3 =>> day 4 Follow up:  TBD  Plan:  The patient says he is only getting one dressing change per day.  I have placed dressing change orders in computer.  We will follow as needed, please call if we can help.  I will get him follow up with the clinic.      LOS: 16 days    Marvelene Stoneberg 04/05/2018 [phone]

## 2018-04-05 NOTE — Progress Notes (Signed)
PROGRESS NOTE        PATIENT DETAILS  Name: Tony Long  Age: 42 y.o.  Sex: male Date of Birth: April 02, 1976 Admit Date: 03/20/2018 Admitting Physician Kendell Bane, MD  UEA:[id number], Rolm Gala, FNP   Brief Narrative:  [name] a 42 y.o. male history of IVDA, DM-2, hypertension admitted for worsening back pain-further evaluation revealed MSSA bacteremia with tricuspid valve endocarditis, epidural abscess involving C2 through sacrum, and numerous soft tissue back abscesses.  Evaluated by general surgery-underwent I&D of her lower back soft tissue abscess, evaluated by neurosurgery-not felt to be a candidate for decompressive surgery due to extensive nature of the disease and lack of any significant neurological findings.  ID following with plans to continue Ancef with stop date of 05/25/2018.    He was initially kept on IV cefazolin and then antibiotic coverage was broadened to Vancomycin and Rocephin on 03/30/2018 by ID.  His Rocephin will stop on 04/06/2018, vancomycin will stop on 04/16/2018 thereafter 4 weeks of Keflex 500 p.o. twice daily per ID.Marland Kitchen   See below for further details   Subjective: Patient in bed, appears comfortable, denies any headache, no fever, no chest pain or pressure, no shortness of breath , no abdominal pain. No focal weakness.  Assessment/Plan:   MSSA Bacteremia with tricuspid valve endocarditis, extensive epidural abscess (C2 through sacrum) and large soft tissue lower back abscess  : due to extensive nature of the epidural abscess and lack of concerning neurological findings-neurosurgery does not recommend decompressive surgery. Seen by ID and current recommendation is total of 4 weeks of antibiotics with a tentative stop date now changed by ID to 04/16/2018 that after 4 weeks of Keflex 500 p.o. twice daily.  Given history of IVDA-not a candidate for outpatient IV antimicrobial therapy. He has received a PICC line, continue IV  antibiotics through PEG.  Patient doing fairly well, hospital course was complicated by an episode of sepsis which arose on 03/30/2018, at that time he was frankly septic and had developed some transient left lower extremity weakness as well of unclear etiology, at that point he had to be briefly moved to ICU, at that point antibiotics were broadened and he is now stable.     Patient aware of the extensive nature of this infection-with life-threatening and life disabling risks including quadriplegia along with life-threatening sepsis with this present infection which is nonsurgical.  This was explained by me to the patient several times along with his parents bedside on 03/22/2018 and 03/23/2018, 03/30/2018.  Repeat Surveillance cultures are negative and he is going to receive a PICC line on 04/05/2018.   Cocaine/Subutex abuse: Withdrawal symptoms much better-on 9/25 patient developed excessive sedation-Klonopin/Flexeril has been changed to as needed dosing-clonidine being tapered down.  On 03/28/2018 he requested me that he be put back on his Suboxone, he does not want short-acting pain medications anymore, changes will be made per his request.    Polysubstance abuse/IVDA: Counseled to quit.  Anemia.  Normocytic, he had some chronic anemia upon admission as well and now worse due to him dilution from IV fluids.  Also some indices are low and could have some element of iron deficiency as well due to multiple blood draws etc., will place on oral iron, transfuse as needed to keep hemoglobin around 7, no signs of active ongoing bleeding..  Mild lower extremity leg edema.  Likely due to multiple liters of fluid given while he was septic, TED stockings and gentle Lasix on 04/03/2018 and monitor.    New onset of left-sided pleuritic chest pain starting on late night 03/29/2018.  Nonspecific EKG changes stable on combination of aspirin, beta-blocker and statin.  Echocardiogram shows no wall motion abnormality and  preserved EF, pain is completely resolved.  Hypertension: Monitor on beta-blocker.  Constipation: Had bowel movement overnight-probably secondary to narcotic use-have placed on scheduled MiraLAX and senna.  Acute urinary retention: Continue Foley and Flomax, trial of Foley removal failed on 04/03/2018, likely combination of lack of activity and spine infection.  Severe hypokalemia and hypomagnesemia.  Both replaced aggressively will continue to monitor.  DM-2: Poor outpatient control, continue Lantus and sliding scale.  Lantus dose adjusted for better control.   CBG (last 3)  Recent Labs    04/04/18 1719 04/04/18 2100 04/05/18 0825  GLUCAP 229* 207* 156*   Lab Results  Component Value Date   HGBA1C 12.6 (A) 02/26/2018     DVT Prophylaxis: Prophylactic heparin  Code Status: Full code  Family Communication: Parents multiple times clearly explained prognosis remains poor, now DNR.  They have clear understanding of his medical situation.  Disposition Plan: Briefly moved to ICU on 03/30/2018, now stabilized moved back to stepdown on 03/31/2018 and monitor  Antimicrobial agents: Anti-infectives (From admission, onward)   Start     Dose/Rate Route Frequency Ordered Stop   04/01/18 1300  nafcillin 2 g in sodium chloride 0.9 % 100 mL IVPB     2 g 200 mL/hr over 30 Minutes Intravenous Every 4 hours 04/01/18 1242     04/01/18 1200  nafcillin injection 2 g  Status:  Discontinued     2 g Intravenous Every 4 hours 04/01/18 1129 04/01/18 1242   03/31/18 0200  vancomycin (VANCOCIN) IVPB 750 mg/150 ml premix  Status:  Discontinued     750 mg 150 mL/hr over 60 Minutes Intravenous Every 8 hours 03/30/18 1628 04/01/18 1129   03/31/18 0100  meropenem (MERREM) 1 g in sodium chloride 0.9 % 100 mL IVPB  Status:  Discontinued     1 g 200 mL/hr over 30 Minutes Intravenous Every 8 hours 03/30/18 1628 04/01/18 1129   03/30/18 1630  vancomycin (VANCOCIN) 1,500 mg in sodium chloride 0.9 % 500 mL  IVPB     1,500 mg 250 mL/hr over 120 Minutes Intravenous NOW 03/30/18 1619 03/30/18 1925   03/30/18 1630  meropenem (MERREM) 2 g in sodium chloride 0.9 % 100 mL IVPB     2 g 200 mL/hr over 30 Minutes Intravenous NOW 03/30/18 1619 03/30/18 1754   03/21/18 0930  ceFAZolin (ANCEF) IVPB 2g/100 mL premix  Status:  Discontinued     2 g 200 mL/hr over 30 Minutes Intravenous Every 8 hours 03/21/18 0920 03/30/18 1616      Procedures:   MRI C, T and L-spine done 03/30/2018 and 03/31/2018.    CT angiogram chest 03/30/2018.  1. No pulmonary embolus. 2. Moderate loculated left pleural effusion. Adjacent airspace disease likely compressive atelectasis, pneumonia not excluded in the setting of spinal infection and endocarditis. Mild right pleural thickening or loculated pleural fluid in the upper right hemithorax.  Repeat echocardiogram 03/30/2018 - Left ventricle: The cavity size was normal. Wall thickness was normal. Systolic function was normal. The estimated ejection fraction was in the range of 60% to 65%. Wall motion was normal; there were no regional wall motion abnormalities. Left ventricular diastolic function parameters  were normal.  TEE - - Left ventricle: The cavity size was normal. Wall thickness wasnormal. Systolic function was normal. The estimated ejectionfraction was in the range of 60% to 65%. - Aortic valve: No evidence of vegetation. - Mitral valve: No evidence of vegetation. - Left atrium: No evidence of thrombus in the atrial cavity or appendage. No evidence of thrombus in the appendage. - Right atrium: No evidence of thrombus in the atrial cavity orappendage. - Tricuspid valve: There was a vegetation. There was a small (1.2cmx .6cm), mobile vegetation on the septal leaflet. There was mildregurgitation. - Pulmonic valve: No evidence of vegetation.  Impressions:   Tricuspid valve endocarditis. Discussed with primary team   MRI C-T-L Spine - 1. Epidural abscess from C2  to sacrum as described. Maximal cord compression from T3-4 to T5-6. The thecal sac is completely effaced from L1 to L4-5. At the L3-4 interspinous space the spinal abscess communicates with large bilateral abscesses within the intrinsic back muscles. There is osteomyelitis of the L2, L3, and L4 spinous processes. No discitis or facet arthritis. 2. Left paravertebral abscess along the lower thoracic spine with small left empyema that is new from CT 2 days ago. 3. Distended bladder  CT - 1. Evidence of cellulitis involving the right side of the base of the neck extending into the right supraclavicular region with fluid and gas in the soft tissues at the base of the right side of the neck. 2. No significant abnormality of the thoracic spine. 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  CT -  1. Extensive abnormal fluid collection in the subcutaneous fat of the midline of the back with underlying marked abnormality of the posterior paraspinal musculature from L1-2 through S3. This is worrisome for subcutaneous abscess and myositis. 2. Moth-eaten appearance of the spinous processes of L3 and L4 consistent with osteomyelitis. 3. No discrete epidural abscess. However, the abnormal edema in the paraspinal musculature extends to the posterior aspect of the spinal canal at L3-4. 4. MRI with and without contrast may better define the extent of the soft tissue and infection and could detect epidural extension that is not apparent on this unenhanced CT scan.  9/22>> PICC line  I&D by [facility] of back soft tissue abscess on -  03/22/18    CONSULTS:  N.Surgery, ID, general surgery, PCCM  Time spent:  25- minutes-Greater than 50% of this time was spent in counseling, explanation of diagnosis, planning of further management, and coordination of care.  MEDICATIONS:  Scheduled Meds:  . aspirin  81 mg Oral Daily  . atorvastatin  40 mg Oral q1800  . buprenorphine-naloxone  2 tablet Sublingual Daily  . feeding  supplement (PRO-STAT SUGAR FREE 64)  30 mL Oral TID WC  . gabapentin  300 mg Oral TID  . heparin injection (subcutaneous)  5,000 Units Subcutaneous Q8H  . insulin aspart  0-5 Units Subcutaneous QHS  . insulin aspart  0-9 Units Subcutaneous TID WC  . insulin aspart  4 Units Subcutaneous TID WC  . insulin glargine  25 Units Subcutaneous QHS  . metoprolol tartrate  50 mg Oral BID  . nicotine  21 mg Transdermal Daily  . polyethylene glycol  17 g Oral BID  . potassium chloride  40 mEq Oral Once  . potassium chloride  40 mEq Oral Once  . senna-docusate  2 tablet Oral QHS  . tamsulosin  0.4 mg Oral Daily   Continuous Infusions: . magnesium sulfate 1 - 4 g bolus IVPB    .  nafcillin IV 2 g (04/05/18 0538)  . potassium chloride     PRN Meds:.acetaminophen, bisacodyl, clonazepam, cyclobenzaprine, hydrALAZINE, LORazepam, magnesium hydroxide, metoprolol tartrate, nitroGLYCERIN, sodium chloride flush, sodium phosphate   PHYSICAL EXAM: Vital signs: Vitals:   04/04/18 2102 04/04/18 2300 04/05/18 0559 04/05/18 0905  BP: 130/77  119/74 130/82  Pulse: 87  80 82  Resp: 16  16   Temp: (!) 100.5 F (38.1 C) 99 F (37.2 C) 98.6 F (37 C)   TempSrc:  Oral Oral   SpO2: 91%  95%   Weight:      Height:       Filed Weights   03/21/18 0602  Weight: 68 kg   Body mass index is 20.92 kg/m.   Exam  Awake Alert, Oriented X 3, No new F.N deficits, Normal affect [place].AT,PERRAL Supple Neck,No JVD, No cervical lymphadenopathy appriciated.  Symmetrical Chest wall movement, Good air movement bilaterally, CTAB RRR,No Gallops, Rubs or new Murmurs, No Parasternal Heave +ve B.Sounds, Abd Soft, No tenderness, No organomegaly appriciated, No rebound - guarding or rigidity.  Foley catheter in place No Cyanosis, Clubbing or edema, No new Rash or bruise L Leg 5/5  I have personally reviewed following labs and imaging studies  LABORATORY DATA: CBC: Recent Labs  Lab 03/30/18 0227 03/30/18 1837  03/31/18 0336 04/02/18 0625 04/05/18 0754  WBC 14.5* 25.2* 24.8* 22.2* 10.2  HGB 9.2* 7.6* 7.5* 9.1* 6.7*  HCT 28.6* 24.3* 23.7* 28.7* 22.2*  MCV 95.7 98.4 97.1 96.6 98.7  PLT 382 365 358 485* 306    Basic Metabolic Panel: Recent Labs  Lab 03/31/18 0336 04/01/18 0311 04/02/18 0625 04/04/18 0634 04/05/18 0754  NA 135 134* 140 138 136  K 3.7 3.5 3.6 3.0* 2.4*  CL 94* 98 97* 100 98  CO2 31 30 30  32 33*  GLUCOSE 266* 305* 360* 258* 169*  BUN 9 8 15 10  <5*  CREATININE 0.62 0.58* 0.76 0.66 0.49*  CALCIUM 7.7* 7.7* 8.4* 7.7* 6.6*  MG 2.0 1.9 2.0 1.6* 1.5*  PHOS 2.7  --   --   --   --     GFR: Estimated Creatinine Clearance: 116.9 mL/min (A) (by C-G formula based on SCr of 0.49 mg/dL (L)).  Liver Function Tests: Recent Labs  Lab 04/02/18 0625  AST 22  ALT 14  ALKPHOS 140*  BILITOT 0.7  PROT 6.5  ALBUMIN 1.5*   No results for input(s): LIPASE, AMYLASE in the last 168 hours. No results for input(s): AMMONIA in the last 168 hours.  Coagulation Profile: No results for input(s): INR, PROTIME in the last 168 hours.  Cardiac Enzymes: Recent Labs  Lab 03/30/18 0219 03/30/18 1222 03/30/18 1620  TROPONINI <0.03 <0.03 <0.03    BNP (last 3 results) No results for input(s): PROBNP in the last 8760 hours.  HbA1C: No results for input(s): HGBA1C in the last 72 hours.  CBG: Recent Labs  Lab 04/04/18 0809 04/04/18 1223 04/04/18 1719 04/04/18 2100 04/05/18 0825  GLUCAP 213* 133* 229* 207* 156*    Lipid Profile: No results for input(s): CHOL, HDL, LDLCALC, TRIG, CHOLHDL, LDLDIRECT in the last 72 hours.  Thyroid Function Tests: No results for input(s): TSH, T4TOTAL, FREET4, T3FREE, THYROIDAB in the last 72 hours.  Anemia Panel: No results for input(s): VITAMINB12, FOLATE, FERRITIN, TIBC, IRON, RETICCTPCT in the last 72 hours.  Urine analysis:    Component Value Date/Time   COLORURINE YELLOW 03/30/2018 1930   APPEARANCEUR CLEAR 03/30/2018 1930   LABSPEC  1.009  03/30/2018 1930   PHURINE 6.0 03/30/2018 1930   GLUCOSEU 50 (A) 03/30/2018 1930   HGBUR SMALL (A) 03/30/2018 1930   BILIRUBINUR NEGATIVE 03/30/2018 1930   BILIRUBINUR small 02/26/2018 1202   KETONESUR NEGATIVE 03/30/2018 1930   PROTEINUR NEGATIVE 03/30/2018 1930   UROBILINOGEN 1.0 02/26/2018 1202   UROBILINOGEN 1.0 12/11/2016 1020   NITRITE NEGATIVE 03/30/2018 1930   LEUKOCYTESUR TRACE (A) 03/30/2018 1930    Sepsis Labs: Lactic Acid, Venous    Component Value Date/Time   LATICACIDVEN 1.2 03/30/2018 1837    MICROBIOLOGY: Recent Results (from the past 240 hour(s))  Culture, blood (single)     Status: None   Collection Time: 03/30/18  4:12 PM  Result Value Ref Range Status   Specimen Description BLOOD RIGHT ANTECUBITAL  Final   Special Requests   Final    BOTTLES DRAWN AEROBIC ONLY Blood Culture adequate volume   Culture   Final    NO GROWTH 5 DAYS Performed at [facility], [address] [place], [place] [address]    Report Status 04/04/2018 FINAL  Final  Culture, Urine     Status: None   Collection Time: 03/30/18  4:20 PM  Result Value Ref Range Status   Specimen Description URINE, CATHETERIZED  Final   Special Requests NONE  Final   Culture   Final    NO GROWTH Performed at [facility], [address], [place], [place] [address]    Report Status 04/01/2018 FINAL  Final  Culture, blood (Routine X 2) w Reflex to ID Panel     Status: None (Preliminary result)   Collection Time: 04/01/18  9:49 AM  Result Value Ref Range Status   Specimen Description BLOOD BLOOD RIGHT HAND  Final   Special Requests   Final    BOTTLES DRAWN AEROBIC ONLY Blood Culture results may not be optimal due to an inadequate volume of blood received in culture bottles   Culture   Final    NO GROWTH 4 DAYS Performed at [facility], [address], [place], [place] [address]    Report Status PENDING  Incomplete  Culture, blood (Routine X 2) w Reflex to ID Panel      Status: None (Preliminary result)   Collection Time: 04/01/18 10:02 AM  Result Value Ref Range Status   Specimen Description BLOOD RIGHT ANTECUBITAL  Final   Special Requests   Final    BOTTLES DRAWN AEROBIC ONLY Blood Culture adequate volume   Culture   Final    NO GROWTH 4 DAYS Performed at [facility], [address] [place], [place] [address]    Report Status PENDING  Incomplete    RADIOLOGY STUDIES/RESULTS: Ct Angio Chest Pe W Or Wo Contrast  Result Date: 03/30/2018 CLINICAL DATA:  Shortness of breath PE suspected, high pretest prob. Patient with known epidural abscess and endocarditis. Now with left-sided chest pain. EXAM: CT ANGIOGRAPHY CHEST WITH CONTRAST TECHNIQUE: Multidetector CT imaging of the chest was performed using the standard protocol during bolus administration of intravenous contrast. Multiplanar CT image reconstructions and MIPs were obtained to evaluate the vascular anatomy. CONTRAST:  54mL ISOVUE-370 IOPAMIDOL (ISOVUE-370) INJECTION 76% COMPARISON:  Chest radiographs earlier this day. FINDINGS: Cardiovascular: There are no filling defects within the pulmonary arteries to suggest pulmonary embolus. The thoracic aorta is normal in caliber without dissection. Mild cardiomegaly. Small amount pericardial fluid. Mediastinum/Nodes: Loculated left pleural effusion tracks along the left aspect of the mediastinum. Small left  hilar nodes without bulky adenopathy. No enlarged mediastinal nodes. No dominant thyroid nodule. The esophagus is decompressed. Lungs/Pleura: Moderate left pleural effusion is loculated and tracks along the posterior, lateral, and medial hemithorax. Adjacent airspace disease in the left lower lobe, favoring compressive atelectasis, with adjacent compressive atelectasis in the right upper lobe. Pleural thickening versus loculated pleural effusion the upper medial right hemithorax, with additional areas of pleural thickening dependently. Dependent atelectasis  in the right lower lobe. Trachea and bronchi are patent. Upper Abdomen: No acute findings. Musculoskeletal: Patient with known epidural abscess, not well delineated by CT. No bony destructive change. Review of the MIP images confirms the above findings. IMPRESSION: 1. No pulmonary embolus. 2. Moderate loculated left pleural effusion. Adjacent airspace disease likely compressive atelectasis, pneumonia not excluded in the setting of spinal infection and endocarditis. Mild right pleural thickening or loculated pleural fluid in the upper right hemithorax. Electronically Signed   By: Narda Rutherford M.D.   On: 03/30/2018 23:21   Ct Thoracic Spine Wo Contrast  Result Date: 03/20/2018 CLINICAL DATA:  Progressive back pain.  Swelling of the lower back. EXAM: CT THORACIC SPINE WITHOUT CONTRAST TECHNIQUE: Multidetector CT images of the thoracic were obtained using the standard protocol without intravenous contrast. COMPARISON:  None. FINDINGS: Alignment: Normal. 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 Paraspinal and other soft tissues: There is abnormal gas and fluid in the soft tissues of the right side of the base of the neck and in the right supraclavicular region. Paraspinal soft tissues appear normal throughout the thoracic spine. Disc levels: There is no evidence of disc protrusion or significant disc bulging or spinal or foraminal stenosis or other significant abnormality of the thoracic spine. IMPRESSION: 1. Evidence of cellulitis involving the right side of the base of the neck extending into the right supraclavicular region with fluid and gas in the soft tissues at the base of the right side of the neck. 2. No significant abnormality of the thoracic spine. 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 Electronically Signed   By: Francene Boyers M.D.   On: 03/20/2018 11:25   Ct Lumbar Spine Wo Contrast  Addendum Date: 03/20/2018   ADDENDUM REPORT: 03/20/2018 11:44 ADDENDUM: Critical Value/emergent results  were called by telephone at the time of interpretation on 03/20/2018 at 11:30 am to Dr. Sharyn Creamer , who verbally acknowledged these results. Electronically Signed   By: Francene Boyers M.D.   On: 03/20/2018 11:44   Result Date: 03/20/2018 CLINICAL DATA:  Increasing low back pain and soft tissue swelling. EXAM: CT LUMBAR SPINE WITHOUT CONTRAST TECHNIQUE: Multidetector CT imaging of the lumbar spine was performed without intravenous contrast administration. Multiplanar CT image reconstructions were also generated. IV contrast could not be utilized due to the lack of an appropriate IV. COMPARISON:  None. FINDINGS: Segmentation: 5 lumbar type vertebrae. Alignment: Normal. Vertebrae: There is a moth-eaten appearance of the spinous processes of L3 and L4 which is worrisome for osteomyelitis. Bilateral pars defects at L5 with grade 1 spondylolisthesis. 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 Paraspinal and other soft tissues: There is an extensive abnormal fluid collection in the subcutaneous soft tissues of the posterior aspect of the back extending from approximately L1-2 to S3. This fluid collection is lobulated and measures approximately 20 x 9 x 2.5 cm. It is centered slightly to the left of midline and has a mass effect upon the adjacent posterior paraspinal muscles. There is abnormal lucency in the underlying paraspinal muscles which could represent myositis. Disc levels: T11-12: No significant  abnormality. Butterfly T12 vertebra. T12-L1: No significant abnormality. L1-2: Normal disc. Abnormal edema in the posterior paraspinal musculature with adjacent fluid collection in the subcutaneous fat of the posterior aspect of the back as described above. L2-3: Normal disc. L3-4: Normal disc. Lucency in the posterior paraspinal soft tissues extends to the posterior aspect of the thecal sac on image 80 of series 4 but there is no discrete epidural abscess. L4-5: Normal disc.  No evidence of epidural abscess. L5-S1: Grade 1  spondylolisthesis. No disc bulging or protrusion. Bilateral pars defects. No visible epidural abscess. IMPRESSION: 1. Extensive abnormal fluid collection in the subcutaneous fat of the midline of the back with underlying marked abnormality of the posterior paraspinal musculature from L1-2 through S3. This is worrisome for subcutaneous abscess and myositis. 2. Moth-eaten appearance of the spinous processes of L3 and L4 consistent with osteomyelitis. 3. No discrete epidural abscess. However, the abnormal edema in the paraspinal musculature extends to the posterior aspect of the spinal canal at L3-4. 4. MRI with and without contrast may better define the extent of the soft tissue and infection and could detect epidural extension that is not apparent on this unenhanced CT scan. Electronically Signed: By: Francene Boyers M.D. On: 03/20/2018 11:18   Mr Cervical Spine Wo Contrast  Result Date: 03/30/2018 CLINICAL DATA:  42 year old male with history of MSSA bacteremia with known endocarditis and epidural abscess extending from C2 through the sacrum. Recent episode of hypotension and acute lower extremity weakness. Evaluate abscess and possible spinal cord infarct. EXAM: MRI CERVICAL, THORACIC SPINE WITHOUT CONTRAST TECHNIQUE: Multiplanar and multiecho pulse sequences of the cervical spine, to include the craniocervical junction and cervicothoracic junction, and thoracic and lumbar spine, were obtained without intravenous contrast. COMPARISON:  Prior MRI from 03/22/2018. FINDINGS: MRI CERVICAL SPINE FINDINGS Alignment: Examination technically limited as the patient was unable to tolerate the full length of the exam. Additionally, images provided are degraded by motion artifact. Reversal of the normal cervical lordosis with apex at C5, stable. No interval listhesis or malalignment. Vertebrae: Vertebral body height maintained without acute or interval fracture. Bone marrow signal intensity diffusely decreased on T1  weighted imaging, suspected to be related to anemia and chronic disease. No discrete osseous lesions. No abnormal marrow edema. No evidence for interval discitis. Cord: Signal intensity within the cervical spinal cord is within normal limits. No findings to suggest interval cord infarction on this motion degraded and limited exam. Previously identified epidural collection involving the ventral epidural space extending from C2 inferiorly is decreased in size, now measuring up to 7 mm in maximal AP diameter at the level of C2. Collection diffusely involves the ventral epidural space, but also is seen dorsally as well. Slightly improved diffuse spinal stenosis with thecal sac patency. Posterior Fossa, vertebral arteries, paraspinal tissues: Visualized brain and posterior fossa within normal limits. Craniocervical junction normal. Scattered edema within the posterior paraspinous soft tissues. Normal intravascular flow voids seen within the vertebral arteries bilaterally. Disc levels: C2-C3: Unremarkable. C3-C4: Small left foraminal protrusion with associated moderate left C4 foraminal stenosis. C4-C5:  Unremarkable. C5-C6: Left eccentric disc bulge with uncovertebral hypertrophy. Moderate left C6 foraminal narrowing. C6-C7: Right foraminal disc protrusion with associated moderate right C7 foraminal stenosis. C7-T1:  Unremarkable. MRI THORACIC SPINE FINDINGS Alignment: Examination markedly limited as the patient was unable to tolerate the full length of the exam. Sagittal T1, T2, and STIR sequences only were performed. No axial images obtained. Vertebral bodies normally aligned with preservation of the normal thoracic kyphosis.  Vertebrae: Vertebral body height maintained without evidence for interval fracture. Butterfly vertebra noted at T12. Diffusely decreased T1 weighted signal intensity throughout the visualized bone marrow, like related to anemia chronic disease. No findings to suggest interval or new discitis or  septic arthritis. Cord: Signal intensity within the thoracic spinal cord grossly within normal limits on these limited sagittal views. No definite cord signal abnormality or cord edema to suggest acute spinal cord infarction. Previously seen diffuse epidural collection appears overall decreased in size from previous, with improved spinal stenosis and thecal sac patency. Paraspinal and other soft tissues: Scattered edema seen within the posterior paraspinous soft tissues at the upper back/cervicothoracic junction. No discrete soft tissue collections. Disc levels: T5-6: Central disc protrusion indenting upon the ventral thoracic spinal cord, stable. IMPRESSION: 1. Technically limited exam due to motion artifact and the patient's inability to tolerate the full length of the exam. Cervical and thoracic spine only was imaged, and only sagittal sequences of the thoracic spine were obtained. 2. No imaging findings to suggest spinal cord infarction identified on this limited exam. 3. Interval improvement in diffuse epidural collection, decreased in size as compared to previous exam with improved diffuse spinal stenosis and thecal sac patency. No new discitis or facet arthritis. Electronically Signed   By: Rise Mu M.D.   On: 03/30/2018 22:52   Mr Thoracic Spine Wo Contrast  Result Date: 03/30/2018 CLINICAL DATA:  42 year old male with history of MSSA bacteremia with known endocarditis and epidural abscess extending from C2 through the sacrum. Recent episode of hypotension and acute lower extremity weakness. Evaluate abscess and possible spinal cord infarct. EXAM: MRI CERVICAL, THORACIC SPINE WITHOUT CONTRAST TECHNIQUE: Multiplanar and multiecho pulse sequences of the cervical spine, to include the craniocervical junction and cervicothoracic junction, and thoracic and lumbar spine, were obtained without intravenous contrast. COMPARISON:  Prior MRI from 03/22/2018. FINDINGS: MRI CERVICAL SPINE FINDINGS  Alignment: Examination technically limited as the patient was unable to tolerate the full length of the exam. Additionally, images provided are degraded by motion artifact. Reversal of the normal cervical lordosis with apex at C5, stable. No interval listhesis or malalignment. Vertebrae: Vertebral body height maintained without acute or interval fracture. Bone marrow signal intensity diffusely decreased on T1 weighted imaging, suspected to be related to anemia and chronic disease. No discrete osseous lesions. No abnormal marrow edema. No evidence for interval discitis. Cord: Signal intensity within the cervical spinal cord is within normal limits. No findings to suggest interval cord infarction on this motion degraded and limited exam. Previously identified epidural collection involving the ventral epidural space extending from C2 inferiorly is decreased in size, now measuring up to 7 mm in maximal AP diameter at the level of C2. Collection diffusely involves the ventral epidural space, but also is seen dorsally as well. Slightly improved diffuse spinal stenosis with thecal sac patency. Posterior Fossa, vertebral arteries, paraspinal tissues: Visualized brain and posterior fossa within normal limits. Craniocervical junction normal. Scattered edema within the posterior paraspinous soft tissues. Normal intravascular flow voids seen within the vertebral arteries bilaterally. Disc levels: C2-C3: Unremarkable. C3-C4: Small left foraminal protrusion with associated moderate left C4 foraminal stenosis. C4-C5:  Unremarkable. C5-C6: Left eccentric disc bulge with uncovertebral hypertrophy. Moderate left C6 foraminal narrowing. C6-C7: Right foraminal disc protrusion with associated moderate right C7 foraminal stenosis. C7-T1:  Unremarkable. MRI THORACIC SPINE FINDINGS Alignment: Examination markedly limited as the patient was unable to tolerate the full length of the exam. Sagittal T1, T2, and STIR sequences only were  performed. No axial images obtained. Vertebral bodies normally aligned with preservation of the normal thoracic kyphosis. Vertebrae: Vertebral body height maintained without evidence for interval fracture. Butterfly vertebra noted at T12. Diffusely decreased T1 weighted signal intensity throughout the visualized bone marrow, like related to anemia chronic disease. No findings to suggest interval or new discitis or septic arthritis. Cord: Signal intensity within the thoracic spinal cord grossly within normal limits on these limited sagittal views. No definite cord signal abnormality or cord edema to suggest acute spinal cord infarction. Previously seen diffuse epidural collection appears overall decreased in size from previous, with improved spinal stenosis and thecal sac patency. Paraspinal and other soft tissues: Scattered edema seen within the posterior paraspinous soft tissues at the upper back/cervicothoracic junction. No discrete soft tissue collections. Disc levels: T5-6: Central disc protrusion indenting upon the ventral thoracic spinal cord, stable. IMPRESSION: 1. Technically limited exam due to motion artifact and the patient's inability to tolerate the full length of the exam. Cervical and thoracic spine only was imaged, and only sagittal sequences of the thoracic spine were obtained. 2. No imaging findings to suggest spinal cord infarction identified on this limited exam. 3. Interval improvement in diffuse epidural collection, decreased in size as compared to previous exam with improved diffuse spinal stenosis and thecal sac patency. No new discitis or facet arthritis. Electronically Signed   By: Rise Mu M.D.   On: 03/30/2018 22:52   Mr Cervical Spine W Wo Contrast  Result Date: 03/22/2018 CLINICAL DATA:  Spine infection. EXAM: MRI TOTAL SPINE WITHOUT AND WITH CONTRAST TECHNIQUE: Multisequence MR imaging of the spine from the cervical spine to the sacrum was performed prior to and  following IV contrast administration. CONTRAST:  6 cc Gadavist intravenous COMPARISON:  CT of the thoracic and lumbar spine from 2 days ago FINDINGS: MRI CERVICAL SPINE FINDINGS Alignment: Normal Vertebrae: No evidence of osseous infection. Canal/Cord: There is extensive spinal fluid collection preferentially in the ventral but also in the right more than left dorsal canal. Subarachnoid space is diffusely effaced. Maximal thickness is posterior to C2 at 9 mm. No cord signal abnormality. Posterior Fossa, vertebral arteries, paraspinal tissues: No retropharyngeal or other discrete soft tissue collection. Disc levels: C2-3: Unremarkable. C3-4: Small left foraminal protrusion with moderate narrowing C4-5: Unremarkable. C5-6: Disc narrowing and bulging with asymmetric left uncovertebral spurring. Left foraminal impingement C6-7: Right foraminal protrusion mild narrowing. C7-T1:Unremarkable. MRI THORACIC SPINE FINDINGS Alignment:  Normal Vertebrae: No evidence of osteomyelitis or discitis. T12 butterfly vertebra. Canal/Cord: Cervical ventral epidural collection continues throughout the thoracic levels. There is also a focal dorsal component at T3-4 to T5-6, where thecal sac effacement is accentuated and there is cord flattening. No cord edema. Paraspinal and other soft tissues: Paraspinous phlegmon on the left at T8-T12, with new complex left pleural effusion and lower lobe atelectasis Disc levels: T5-6 central disc protrusion. MRI LUMBAR SPINE FINDINGS Segmentation:  5 lumbar type vertebral bodies Alignment:  Grade 1 anterolisthesis at L5-S1. Vertebrae: Marrow edema and heterogeneous enhancement within the L2, L3, and L4 spinous processes. No discitis or facet edema. Conus medullaris: Extends to the L1 level and is non edematous. There is extensive epidural collection completely effacing the thecal sac throughout the lumbar spine until L4-5 and below where the collection becomes ventral and right eccentric. The infection  communicates with extensive bilateral abscess within the intrinsic back muscles via the interspinous space at L3-4. Patient had recent subcutaneous collection and left buttocks collection drainage, with packing seen in place.  This midline, upper subcutaneous collection communicates with the paravertebral abscess along its superior margin based on postcontrast axial images. Paraspinal and other soft tissues: As above.  Distended bladder Disc levels: Chronic bilateral pars defects at L5. Critical Value/emergent results were called by telephone at the time of interpretation on 03/22/2018 at 3:04 pm to Dr. Thedore Mins , who verbally acknowledged these results. IMPRESSION: 1. Epidural abscess from C2 to sacrum as described. Maximal cord compression from T3-4 to T5-6. The thecal sac is completely effaced from L1 to L4-5. At the L3-4 interspinous space the spinal abscess communicates with large bilateral abscesses within the intrinsic back muscles. There is osteomyelitis of the L2, L3, and L4 spinous processes. No discitis or facet arthritis. 2. Left paravertebral abscess along the lower thoracic spine with small left empyema that is new from CT 2 days ago. 3. Distended bladder Electronically Signed   By: Marnee Spring M.D.   On: 03/22/2018 15:11   Mr Thoracic Spine W Wo Contrast  Result Date: 03/22/2018 CLINICAL DATA:  Spine infection. EXAM: MRI TOTAL SPINE WITHOUT AND WITH CONTRAST TECHNIQUE: Multisequence MR imaging of the spine from the cervical spine to the sacrum was performed prior to and following IV contrast administration. CONTRAST:  6 cc Gadavist intravenous COMPARISON:  CT of the thoracic and lumbar spine from 2 days ago FINDINGS: MRI CERVICAL SPINE FINDINGS Alignment: Normal Vertebrae: No evidence of osseous infection. Canal/Cord: There is extensive spinal fluid collection preferentially in the ventral but also in the right more than left dorsal canal. Subarachnoid space is diffusely effaced. Maximal thickness  is posterior to C2 at 9 mm. No cord signal abnormality. Posterior Fossa, vertebral arteries, paraspinal tissues: No retropharyngeal or other discrete soft tissue collection. Disc levels: C2-3: Unremarkable. C3-4: Small left foraminal protrusion with moderate narrowing C4-5: Unremarkable. C5-6: Disc narrowing and bulging with asymmetric left uncovertebral spurring. Left foraminal impingement C6-7: Right foraminal protrusion mild narrowing. C7-T1:Unremarkable. MRI THORACIC SPINE FINDINGS Alignment:  Normal Vertebrae: No evidence of osteomyelitis or discitis. T12 butterfly vertebra. Canal/Cord: Cervical ventral epidural collection continues throughout the thoracic levels. There is also a focal dorsal component at T3-4 to T5-6, where thecal sac effacement is accentuated and there is cord flattening. No cord edema. Paraspinal and other soft tissues: Paraspinous phlegmon on the left at T8-T12, with new complex left pleural effusion and lower lobe atelectasis Disc levels: T5-6 central disc protrusion. MRI LUMBAR SPINE FINDINGS Segmentation:  5 lumbar type vertebral bodies Alignment:  Grade 1 anterolisthesis at L5-S1. Vertebrae: Marrow edema and heterogeneous enhancement within the L2, L3, and L4 spinous processes. No discitis or facet edema. Conus medullaris: Extends to the L1 level and is non edematous. There is extensive epidural collection completely effacing the thecal sac throughout the lumbar spine until L4-5 and below where the collection becomes ventral and right eccentric. The infection communicates with extensive bilateral abscess within the intrinsic back muscles via the interspinous space at L3-4. Patient had recent subcutaneous collection and left buttocks collection drainage, with packing seen in place. This midline, upper subcutaneous collection communicates with the paravertebral abscess along its superior margin based on postcontrast axial images. Paraspinal and other soft tissues: As above.  Distended  bladder Disc levels: Chronic bilateral pars defects at L5. Critical Value/emergent results were called by telephone at the time of interpretation on 03/22/2018 at 3:04 pm to Dr. Thedore Mins , who verbally acknowledged these results. IMPRESSION: 1. Epidural abscess from C2 to sacrum as described. Maximal cord compression from T3-4 to T5-6. The thecal  sac is completely effaced from L1 to L4-5. At the L3-4 interspinous space the spinal abscess communicates with large bilateral abscesses within the intrinsic back muscles. There is osteomyelitis of the L2, L3, and L4 spinous processes. No discitis or facet arthritis. 2. Left paravertebral abscess along the lower thoracic spine with small left empyema that is new from CT 2 days ago. 3. Distended bladder Electronically Signed   By: Marnee Spring M.D.   On: 03/22/2018 15:11   Mr Lumbar Spine W Wo Contrast  Result Date: 03/22/2018 CLINICAL DATA:  Spine infection. EXAM: MRI TOTAL SPINE WITHOUT AND WITH CONTRAST TECHNIQUE: Multisequence MR imaging of the spine from the cervical spine to the sacrum was performed prior to and following IV contrast administration. CONTRAST:  6 cc Gadavist intravenous COMPARISON:  CT of the thoracic and lumbar spine from 2 days ago FINDINGS: MRI CERVICAL SPINE FINDINGS Alignment: Normal Vertebrae: No evidence of osseous infection. Canal/Cord: There is extensive spinal fluid collection preferentially in the ventral but also in the right more than left dorsal canal. Subarachnoid space is diffusely effaced. Maximal thickness is posterior to C2 at 9 mm. No cord signal abnormality. Posterior Fossa, vertebral arteries, paraspinal tissues: No retropharyngeal or other discrete soft tissue collection. Disc levels: C2-3: Unremarkable. C3-4: Small left foraminal protrusion with moderate narrowing C4-5: Unremarkable. C5-6: Disc narrowing and bulging with asymmetric left uncovertebral spurring. Left foraminal impingement C6-7: Right foraminal protrusion mild  narrowing. C7-T1:Unremarkable. MRI THORACIC SPINE FINDINGS Alignment:  Normal Vertebrae: No evidence of osteomyelitis or discitis. T12 butterfly vertebra. Canal/Cord: Cervical ventral epidural collection continues throughout the thoracic levels. There is also a focal dorsal component at T3-4 to T5-6, where thecal sac effacement is accentuated and there is cord flattening. No cord edema. Paraspinal and other soft tissues: Paraspinous phlegmon on the left at T8-T12, with new complex left pleural effusion and lower lobe atelectasis Disc levels: T5-6 central disc protrusion. MRI LUMBAR SPINE FINDINGS Segmentation:  5 lumbar type vertebral bodies Alignment:  Grade 1 anterolisthesis at L5-S1. Vertebrae: Marrow edema and heterogeneous enhancement within the L2, L3, and L4 spinous processes. No discitis or facet edema. Conus medullaris: Extends to the L1 level and is non edematous. There is extensive epidural collection completely effacing the thecal sac throughout the lumbar spine until L4-5 and below where the collection becomes ventral and right eccentric. The infection communicates with extensive bilateral abscess within the intrinsic back muscles via the interspinous space at L3-4. Patient had recent subcutaneous collection and left buttocks collection drainage, with packing seen in place. This midline, upper subcutaneous collection communicates with the paravertebral abscess along its superior margin based on postcontrast axial images. Paraspinal and other soft tissues: As above.  Distended bladder Disc levels: Chronic bilateral pars defects at L5. Critical Value/emergent results were called by telephone at the time of interpretation on 03/22/2018 at 3:04 pm to Dr. Thedore Mins , who verbally acknowledged these results. IMPRESSION: 1. Epidural abscess from C2 to sacrum as described. Maximal cord compression from T3-4 to T5-6. The thecal sac is completely effaced from L1 to L4-5. At the L3-4 interspinous space the spinal  abscess communicates with large bilateral abscesses within the intrinsic back muscles. There is osteomyelitis of the L2, L3, and L4 spinous processes. No discitis or facet arthritis. 2. Left paravertebral abscess along the lower thoracic spine with small left empyema that is new from CT 2 days ago. 3. Distended bladder Electronically Signed   By: Marnee Spring M.D.   On: 03/22/2018 15:11  Dg Chest Port 1 View  Result Date: 03/30/2018 CLINICAL DATA:  Shortness of breath EXAM: PORTABLE CHEST 1 VIEW COMPARISON:  03/30/2018 at 0227 hours FINDINGS: Moderate layering left pleural effusion, increased. Left lower lobe opacity, atelectasis versus pneumonia. Right lung is clear.  No pneumothorax. The heart is normal in size. IMPRESSION: Moderate layering left pleural effusion, increased. Left lower lobe opacity, atelectasis versus pneumonia. Electronically Signed   By: Charline Bills M.D.   On: 03/30/2018 19:06   Dg Chest Port 1 View  Result Date: 03/30/2018 CLINICAL DATA:  Chest pain EXAM: PORTABLE CHEST 1 VIEW COMPARISON:  None. FINDINGS: Retrocardiac opacity, atelectasis versus pneumonia. Possible small left pleural effusion. Right lung is clear. No pneumothorax. The heart is normal in size. IMPRESSION: Retrocardiac opacity, atelectasis versus pneumonia. Possible small left pleural effusion. Electronically Signed   By: Charline Bills M.D.   On: 03/30/2018 02:49   [place] Ekg Site Rite  Result Date: 04/04/2018 If Site Rite image not attached, placement could not be confirmed due to current cardiac rhythm.  [place] Ekg Site Rite  Result Date: 03/21/2018 If Site Rite image not attached, placement could not be confirmed due to current cardiac rhythm.    LOS: 16 days   Signature  Susa Raring M.D on 04/05/2018 at 9:38 AM  To page go to [email or web address] - password [facility]

## 2018-04-06 LAB — GLUCOSE, CAPILLARY
GLUCOSE-CAPILLARY: 146 mg/dL — AB (ref 70–99)
GLUCOSE-CAPILLARY: 211 mg/dL — AB (ref 70–99)
Glucose-Capillary: 125 mg/dL — ABNORMAL HIGH (ref 70–99)
Glucose-Capillary: 202 mg/dL — ABNORMAL HIGH (ref 70–99)

## 2018-04-06 LAB — CULTURE, BLOOD (ROUTINE X 2)
CULTURE: NO GROWTH
Culture: NO GROWTH
SPECIAL REQUESTS: ADEQUATE

## 2018-04-06 LAB — CBC
HEMATOCRIT: 23.8 % — AB (ref 39.0–52.0)
HEMOGLOBIN: 7.2 g/dL — AB (ref 13.0–17.0)
MCH: 29.9 pg (ref 26.0–34.0)
MCHC: 30.3 g/dL (ref 30.0–36.0)
MCV: 98.8 fL (ref 80.0–100.0)
Platelets: 328 10*3/uL (ref 150–400)
RBC: 2.41 MIL/uL — AB (ref 4.22–5.81)
RDW: 13.6 % (ref 11.5–15.5)
WBC: 10.6 10*3/uL — ABNORMAL HIGH (ref 4.0–10.5)

## 2018-04-06 LAB — BASIC METABOLIC PANEL
ANION GAP: 6 (ref 5–15)
BUN: 6 mg/dL (ref 6–20)
CO2: 34 mmol/L — AB (ref 22–32)
Calcium: 7.5 mg/dL — ABNORMAL LOW (ref 8.9–10.3)
Chloride: 96 mmol/L — ABNORMAL LOW (ref 98–111)
Creatinine, Ser: 0.61 mg/dL (ref 0.61–1.24)
GFR calc Af Amer: >60 mL/min (ref >60)
GLUCOSE: 210 mg/dL — AB (ref 70–99)
POTASSIUM: 3.9 mmol/L (ref 3.5–5.1)
Sodium: 136 mmol/L (ref 135–145)

## 2018-04-06 LAB — PREPARE RBC (CROSSMATCH)

## 2018-04-06 LAB — MAGNESIUM: Magnesium: 1.9 mg/dL (ref 1.7–2.4)

## 2018-04-06 MED ORDER — POTASSIUM CHLORIDE 10 MEQ/100ML IV SOLN
INTRAVENOUS | Status: AC
  Administered 2018-04-06: 10 meq via INTRAVENOUS
  Filled 2018-04-06: qty 100

## 2018-04-06 MED ORDER — SODIUM CHLORIDE 0.9% IV SOLUTION
Freq: Once | INTRAVENOUS | Status: AC
  Administered 2018-04-06: 14:00:00 via INTRAVENOUS

## 2018-04-06 MED ORDER — POTASSIUM CHLORIDE CRYS ER 20 MEQ PO TBCR
40.0000 meq | EXTENDED_RELEASE_TABLET | Freq: Once | ORAL | Status: AC
  Administered 2018-04-06: 40 meq via ORAL
  Filled 2018-04-06: qty 2

## 2018-04-06 MED ORDER — FUROSEMIDE 10 MG/ML IJ SOLN
40.0000 mg | Freq: Once | INTRAMUSCULAR | Status: AC
  Administered 2018-04-06: 40 mg via INTRAVENOUS
  Filled 2018-04-06: qty 4

## 2018-04-06 MED ORDER — FUROSEMIDE 10 MG/ML IJ SOLN
20.0000 mg | Freq: Once | INTRAMUSCULAR | Status: DC
  Filled 2018-04-06: qty 2

## 2018-04-06 NOTE — Progress Notes (Addendum)
Subjective: Patient reports "I feel pretty good... been walking"  Objective: Vital signs in last 24 hours: Temp:  [99.1 F (37.3 C)-100 F (37.8 C)] 100 F (37.8 C) (10/08 1303) Pulse Rate:  [79-90] 82 (10/08 1303) Resp:  [16-17] 16 (10/08 1303) BP: (106-122)/(61-76) 108/69 (10/08 1303) SpO2:  [95 %-97 %] 96 % (10/08 1303)  Intake/Output from previous day: 10/07 0701 - 10/08 0700 In: 540.9 [IV Piggyback:540.9] Out: 4800 [Urine:4800] Intake/Output this shift: Total I/O In: 360 [P.O.:360] Out: -   Sitting up in chair without c/o pain or discomfort. Neuro exam unchanged: full strength all extremities. TED hose in use for BLE edema. Lumbar wounds deferred to Wound therapy.  Lab Results: Recent Labs    04/05/18 0754 04/05/18 1133 04/06/18 0355  WBC 10.2  --  10.6*  HGB 6.7* 7.3* 7.2*  HCT 22.2* 23.3* 23.8*  PLT 306  --  328   BMET Recent Labs    04/05/18 0754 04/06/18 0355  NA 136 136  K 2.4* 3.9  CL 98 96*  CO2 33* 34*  GLUCOSE 169* 210*  BUN <5* 6  CREATININE 0.49* 0.61  CALCIUM 6.6* 7.5*    Studies/Results: No results found.  Assessment/Plan:   LOS: 17 days  Supportive care continues.   Tony Long 04/06/2018, 1:49 PM   Patient continues to improve.

## 2018-04-06 NOTE — Progress Notes (Signed)
PROGRESS NOTE        PATIENT DETAILS  Name: Tony Long  Age: 42 y.o.  Sex: male Date of Birth: [birth year] Admit Date: 03/20/2018 Admitting Physician Kendell Bane, MD  JYN:[id number], Rolm Gala, FNP   Brief Narrative:  [name] a 42 y.o. male history of IVDA, DM-2, hypertension admitted for worsening back pain-further evaluation revealed MSSA bacteremia with tricuspid valve endocarditis, epidural abscess involving C2 through sacrum, and numerous soft tissue back abscesses.  Evaluated by general surgery-underwent I&D of her lower back soft tissue abscess, evaluated by neurosurgery-not felt to be a candidate for decompressive surgery due to extensive nature of the disease and lack of any significant neurological findings.  ID following with plans to continue Ancef with stop date of 05/25/2018.    He was initially kept on IV cefazolin and then antibiotic coverage was broadened to Vancomycin and Rocephin on 03/30/2018 by ID.  His Rocephin will stop on 04/06/2018, vancomycin will stop on 04/16/2018 thereafter 4 weeks of Keflex 500 p.o. twice daily per ID.Marland Kitchen   See below for further details   Subjective: Patient in bed, appears comfortable, denies any headache, no fever, no chest pain or pressure, no shortness of breath , no abdominal pain. No focal weakness.  Assessment/Plan:   MSSA Bacteremia with tricuspid valve endocarditis, extensive epidural abscess (C2 through sacrum) and large soft tissue lower back abscess  : due to extensive nature of the epidural abscess and lack of concerning neurological findings-neurosurgery does not recommend decompressive surgery. Seen by ID and current recommendation is total of 4 weeks of antibiotics with a tentative stop date now changed by ID to 04/16/2018 that after 4 weeks of Keflex 500 p.o. twice daily.  Given history of IVDA-not a candidate for outpatient IV antimicrobial therapy. He has received a PICC line, continue IV  antibiotics through PEG.  Patient doing fairly well, hospital course was complicated by an episode of sepsis which arose on 03/30/2018, at that time he was frankly septic and had developed some transient left lower extremity weakness as well of unclear etiology, at that point he had to be briefly moved to ICU, at that point antibiotics were broadened and he is now stable.     Patient aware of the extensive nature of this infection-with life-threatening and life disabling risks including quadriplegia along with life-threatening sepsis with this present infection which is nonsurgical.  This was explained by me to the patient several times along with his parents bedside on 03/22/2018 and 03/23/2018, 03/30/2018.  Repeat Surveillance cultures are negative and he is going to receive a PICC line on 04/05/2018.   Cocaine/Subutex abuse: Withdrawal symptoms much better-on 9/25 patient developed excessive sedation-Klonopin/Flexeril has been changed to as needed dosing-clonidine being tapered down.  On 03/28/2018 he requested me that he be put back on his Suboxone, he does not want short-acting pain medications anymore, changes will be made per his request.    Polysubstance abuse/IVDA: Counseled to quit.  Anemia.  Normocytic, he had some chronic anemia upon admission as well and now worse due to him dilution from IV fluids also some element of iron deficiency per anemia panel likely due to multiple blood draws.  No signs of ongoing bleeding or acute blood loss, will transfuse 1 unit of packed RBC on 04/06/2018 and monitor.  Mild lower extremity leg edema.  Likely due to multiple  liters of fluid given while he was septic, TED stockings and gentle Lasix on 04/03/2018 & post RBC on 04/06/2018 and monitor.    New onset of left-sided pleuritic chest pain starting on late night 03/29/2018.  Nonspecific EKG changes stable on combination of aspirin, beta-blocker and statin.  Echocardiogram shows no wall motion abnormality and  preserved EF, pain is completely resolved.  Hypertension: Monitor on beta-blocker.  Constipation: placed on bowel regimen and now stable.  Acute urinary retention: Continue Foley and Flomax, trial of Foley removal failed on 04/03/2018, likely combination of lack of activity and spine infection.  Severe hypokalemia and hypomagnesemia.  Both replaced aggressively will continue to monitor.  DM-2: Poor outpatient control, continue Lantus and sliding scale.  Lantus dose adjusted for better control.   CBG (last 3)  Recent Labs    04/05/18 1731 04/05/18 2152 04/06/18 0821  GLUCAP 226* 229* 146*   Lab Results  Component Value Date   HGBA1C 12.6 (A) 02/26/2018     DVT Prophylaxis: Prophylactic heparin  Code Status: Full code  Family Communication: Parents multiple times clearly explained prognosis remains poor, now DNR.  They have clear understanding of his medical situation.  Disposition Plan: Briefly moved to ICU on 03/30/2018, now stabilized moved back to stepdown on 03/31/2018 and monitor  Antimicrobial agents: Anti-infectives (From admission, onward)   Start     Dose/Rate Route Frequency Ordered Stop   04/01/18 1300  nafcillin 2 g in sodium chloride 0.9 % 100 mL IVPB     2 g 200 mL/hr over 30 Minutes Intravenous Every 4 hours 04/01/18 1242     04/01/18 1200  nafcillin injection 2 g  Status:  Discontinued     2 g Intravenous Every 4 hours 04/01/18 1129 04/01/18 1242   03/31/18 0200  vancomycin (VANCOCIN) IVPB 750 mg/150 ml premix  Status:  Discontinued     750 mg 150 mL/hr over 60 Minutes Intravenous Every 8 hours 03/30/18 1628 04/01/18 1129   03/31/18 0100  meropenem (MERREM) 1 g in sodium chloride 0.9 % 100 mL IVPB  Status:  Discontinued     1 g 200 mL/hr over 30 Minutes Intravenous Every 8 hours 03/30/18 1628 04/01/18 1129   03/30/18 1630  vancomycin (VANCOCIN) 1,500 mg in sodium chloride 0.9 % 500 mL IVPB     1,500 mg 250 mL/hr over 120 Minutes Intravenous NOW  03/30/18 1619 03/30/18 1925   03/30/18 1630  meropenem (MERREM) 2 g in sodium chloride 0.9 % 100 mL IVPB     2 g 200 mL/hr over 30 Minutes Intravenous NOW 03/30/18 1619 03/30/18 1754   03/21/18 0930  ceFAZolin (ANCEF) IVPB 2g/100 mL premix  Status:  Discontinued     2 g 200 mL/hr over 30 Minutes Intravenous Every 8 hours 03/21/18 0920 03/30/18 1616      Procedures:   MRI C, T and L-spine done 03/30/2018 and 03/31/2018.    CT angiogram chest 03/30/2018.  1. No pulmonary embolus. 2. Moderate loculated left pleural effusion. Adjacent airspace disease likely compressive atelectasis, pneumonia not excluded in the setting of spinal infection and endocarditis. Mild right pleural thickening or loculated pleural fluid in the upper right hemithorax.  Repeat echocardiogram 03/30/2018 - Left ventricle: The cavity size was normal. Wall thickness was normal. Systolic function was normal. The estimated ejection fraction was in the range of 60% to 65%. Wall motion was normal; there were no regional wall motion abnormalities. Left ventricular diastolic function parameters were normal.  TEE - -  Left ventricle: The cavity size was normal. Wall thickness wasnormal. Systolic function was normal. The estimated ejectionfraction was in the range of 60% to 65%. - Aortic valve: No evidence of vegetation. - Mitral valve: No evidence of vegetation. - Left atrium: No evidence of thrombus in the atrial cavity or appendage. No evidence of thrombus in the appendage. - Right atrium: No evidence of thrombus in the atrial cavity orappendage. - Tricuspid valve: There was a vegetation. There was a small (1.2cmx .6cm), mobile vegetation on the septal leaflet. There was mildregurgitation. - Pulmonic valve: No evidence of vegetation.  Impressions:   Tricuspid valve endocarditis. Discussed with primary team   MRI C-T-L Spine - 1. Epidural abscess from C2 to sacrum as described. Maximal cord compression from T3-4 to  T5-6. The thecal sac is completely effaced from L1 to L4-5. At the L3-4 interspinous space the spinal abscess communicates with large bilateral abscesses within the intrinsic back muscles. There is osteomyelitis of the L2, L3, and L4 spinous processes. No discitis or facet arthritis. 2. Left paravertebral abscess along the lower thoracic spine with small left empyema that is new from CT 2 days ago. 3. Distended bladder  CT - 1. Evidence of cellulitis involving the right side of the base of the neck extending into the right supraclavicular region with fluid and gas in the soft tissues at the base of the right side of the neck. 2. No significant abnormality of the thoracic spine. 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  CT -  1. Extensive abnormal fluid collection in the subcutaneous fat of the midline of the back with underlying marked abnormality of the posterior paraspinal musculature from L1-2 through S3. This is worrisome for subcutaneous abscess and myositis. 2. Moth-eaten appearance of the spinous processes of L3 and L4 consistent with osteomyelitis. 3. No discrete epidural abscess. However, the abnormal edema in the paraspinal musculature extends to the posterior aspect of the spinal canal at L3-4. 4. MRI with and without contrast may better define the extent of the soft tissue and infection and could detect epidural extension that is not apparent on this unenhanced CT scan.  9/22>> PICC line  I&D by [facility] of back soft tissue abscess on -  03/22/18    CONSULTS:  N.Surgery, ID, general surgery, PCCM  Time spent:  25- minutes-Greater than 50% of this time was spent in counseling, explanation of diagnosis, planning of further management, and coordination of care.  MEDICATIONS:  Scheduled Meds:  . sodium chloride   Intravenous Once  . aspirin  81 mg Oral Daily  . atorvastatin  40 mg Oral q1800  . buprenorphine-naloxone  2 tablet Sublingual Daily  . feeding supplement (PRO-STAT SUGAR FREE  64)  30 mL Oral TID WC  . furosemide  20 mg Intravenous Once  . gabapentin  300 mg Oral TID  . heparin injection (subcutaneous)  5,000 Units Subcutaneous Q8H  . insulin aspart  0-5 Units Subcutaneous QHS  . insulin aspart  0-9 Units Subcutaneous TID WC  . insulin aspart  4 Units Subcutaneous TID WC  . insulin glargine  25 Units Subcutaneous QHS  . metoprolol tartrate  50 mg Oral BID  . nicotine  21 mg Transdermal Daily  . polyethylene glycol  17 g Oral BID  . senna-docusate  2 tablet Oral QHS  . tamsulosin  0.4 mg Oral Daily   Continuous Infusions: . nafcillin IV 2 g (04/06/18 0901)   PRN Meds:.acetaminophen, bisacodyl, clonazepam, cyclobenzaprine, hydrALAZINE, LORazepam, magnesium hydroxide, metoprolol tartrate,  nitroGLYCERIN, sodium chloride flush, sodium phosphate   PHYSICAL EXAM: Vital signs: Vitals:   04/05/18 0905 04/05/18 1325 04/05/18 2158 04/06/18 0642  BP: 130/82 110/75 112/75 106/61  Pulse: 82 79 90 79  Resp:  16 17 17   Temp:  99.4 F (37.4 C) 99.1 F (37.3 C) 99.1 F (37.3 C)  TempSrc:  Oral Oral Oral  SpO2:  96% 97% 95%  Weight:      Height:       Filed Weights   03/21/18 0602  Weight: 68 kg   Body mass index is 20.92 kg/m.   Exam  Awake Alert, Oriented X 3, No new F.N deficits, Normal affect [place].AT,PERRAL Supple Neck,No JVD, No cervical lymphadenopathy appriciated.  Symmetrical Chest wall movement, Good air movement bilaterally, CTAB RRR,No Gallops, Rubs or new Murmurs, No Parasternal Heave +ve B.Sounds, Abd Soft, No tenderness, No organomegaly appriciated, No rebound - guarding or rigidity. No Cyanosis, Clubbing or edema, No new Rash or bruise L Leg 5/5  I have personally reviewed following labs and imaging studies  LABORATORY DATA: CBC: Recent Labs  Lab 03/30/18 1837 03/31/18 0336 04/02/18 0625 04/05/18 0754 04/05/18 1133 04/06/18 0355  WBC 25.2* 24.8* 22.2* 10.2  --  10.6*  HGB 7.6* 7.5* 9.1* 6.7* 7.3* 7.2*  HCT 24.3* 23.7* 28.7*  22.2* 23.3* 23.8*  MCV 98.4 97.1 96.6 98.7  --  98.8  PLT 365 358 485* 306  --  328    Basic Metabolic Panel: Recent Labs  Lab 03/31/18 0336 04/01/18 0311 04/02/18 0625 04/04/18 0634 04/05/18 0754 04/06/18 0355  NA 135 134* 140 138 136 136  K 3.7 3.5 3.6 3.0* 2.4* 3.9  CL 94* 98 97* 100 98 96*  CO2 31 30 30  32 33* 34*  GLUCOSE 266* 305* 360* 258* 169* 210*  BUN 9 8 15 10  <5* 6  CREATININE 0.62 0.58* 0.76 0.66 0.49* 0.61  CALCIUM 7.7* 7.7* 8.4* 7.7* 6.6* 7.5*  MG 2.0 1.9 2.0 1.6* 1.5* 1.9  PHOS 2.7  --   --   --   --   --     GFR: Estimated Creatinine Clearance: 116.9 mL/min (by C-G formula based on SCr of 0.61 mg/dL).  Liver Function Tests: Recent Labs  Lab 04/02/18 0625  AST 22  ALT 14  ALKPHOS 140*  BILITOT 0.7  PROT 6.5  ALBUMIN 1.5*   No results for input(s): LIPASE, AMYLASE in the last 168 hours. No results for input(s): AMMONIA in the last 168 hours.  Coagulation Profile: No results for input(s): INR, PROTIME in the last 168 hours.  Cardiac Enzymes: Recent Labs  Lab 03/30/18 1222 03/30/18 1620  TROPONINI <0.03 <0.03    BNP (last 3 results) No results for input(s): PROBNP in the last 8760 hours.  HbA1C: No results for input(s): HGBA1C in the last 72 hours.  CBG: Recent Labs  Lab 04/05/18 0825 04/05/18 1210 04/05/18 1731 04/05/18 2152 04/06/18 0821  GLUCAP 156* 117* 226* 229* 146*    Lipid Profile: No results for input(s): CHOL, HDL, LDLCALC, TRIG, CHOLHDL, LDLDIRECT in the last 72 hours.  Thyroid Function Tests: No results for input(s): TSH, T4TOTAL, FREET4, T3FREE, THYROIDAB in the last 72 hours.  Anemia Panel: No results for input(s): VITAMINB12, FOLATE, FERRITIN, TIBC, IRON, RETICCTPCT in the last 72 hours.  Urine analysis:    Component Value Date/Time   COLORURINE YELLOW 03/30/2018 1930   APPEARANCEUR CLEAR 03/30/2018 1930   LABSPEC 1.009 03/30/2018 1930   PHURINE 6.0 03/30/2018 1930  GLUCOSEU 50 (A) 03/30/2018 1930    HGBUR SMALL (A) 03/30/2018 1930   BILIRUBINUR NEGATIVE 03/30/2018 1930   BILIRUBINUR small 02/26/2018 1202   KETONESUR NEGATIVE 03/30/2018 1930   PROTEINUR NEGATIVE 03/30/2018 1930   UROBILINOGEN 1.0 02/26/2018 1202   UROBILINOGEN 1.0 12/11/2016 1020   NITRITE NEGATIVE 03/30/2018 1930   LEUKOCYTESUR TRACE (A) 03/30/2018 1930    Sepsis Labs: Lactic Acid, Venous    Component Value Date/Time   LATICACIDVEN 1.2 03/30/2018 1837    MICROBIOLOGY: Recent Results (from the past 240 hour(s))  Culture, blood (single)     Status: None   Collection Time: 03/30/18  4:12 PM  Result Value Ref Range Status   Specimen Description BLOOD RIGHT ANTECUBITAL  Final   Special Requests   Final    BOTTLES DRAWN AEROBIC ONLY Blood Culture adequate volume   Culture   Final    NO GROWTH 5 DAYS Performed at [facility], [address], [place], [place] [address]    Report Status 04/04/2018 FINAL  Final  Culture, Urine     Status: None   Collection Time: 03/30/18  4:20 PM  Result Value Ref Range Status   Specimen Description URINE, CATHETERIZED  Final   Special Requests NONE  Final   Culture   Final    NO GROWTH Performed at [facility], [address] [place], [place] [address]    Report Status 04/01/2018 FINAL  Final  Culture, blood (Routine X 2) w Reflex to ID Panel     Status: None (Preliminary result)   Collection Time: 04/01/18  9:49 AM  Result Value Ref Range Status   Specimen Description BLOOD BLOOD RIGHT HAND  Final   Special Requests   Final    BOTTLES DRAWN AEROBIC ONLY Blood Culture results may not be optimal due to an inadequate volume of blood received in culture bottles   Culture   Final    NO GROWTH 4 DAYS Performed at [facility], [address], [place], [place] [address]    Report Status PENDING  Incomplete  Culture, blood (Routine X 2) w Reflex to ID Panel     Status: None (Preliminary result)   Collection Time: 04/01/18 10:02 AM  Result Value Ref  Range Status   Specimen Description BLOOD RIGHT ANTECUBITAL  Final   Special Requests   Final    BOTTLES DRAWN AEROBIC ONLY Blood Culture adequate volume   Culture   Final    NO GROWTH 4 DAYS Performed at [name], [address], [place], [place] [address]    Report Status PENDING  Incomplete    RADIOLOGY STUDIES/RESULTS:  Ct Angio Chest Pe W Or Wo Contrast  Result Date: 03/30/2018 CLINICAL DATA:  Shortness of breath PE suspected, high pretest prob. Patient with known epidural abscess and endocarditis. Now with left-sided chest pain. EXAM: CT ANGIOGRAPHY CHEST WITH CONTRAST TECHNIQUE: Multidetector CT imaging of the chest was performed using the standard protocol during bolus administration of intravenous contrast. Multiplanar CT image reconstructions and MIPs were obtained to evaluate the vascular anatomy. CONTRAST:  54mL ISOVUE-370 IOPAMIDOL (ISOVUE-370) INJECTION 76% COMPARISON:  Chest radiographs earlier this day. FINDINGS: Cardiovascular: There are no filling defects within the pulmonary arteries to suggest pulmonary embolus. The thoracic aorta is normal in caliber without dissection. Mild cardiomegaly. Small amount pericardial fluid. Mediastinum/Nodes: Loculated left pleural effusion tracks along the left aspect of the mediastinum. Small left hilar nodes without bulky adenopathy. No enlarged mediastinal  nodes. No dominant thyroid nodule. The esophagus is decompressed. Lungs/Pleura: Moderate left pleural effusion is loculated and tracks along the posterior, lateral, and medial hemithorax. Adjacent airspace disease in the left lower lobe, favoring compressive atelectasis, with adjacent compressive atelectasis in the right upper lobe. Pleural thickening versus loculated pleural effusion the upper medial right hemithorax, with additional areas of pleural thickening dependently. Dependent atelectasis in the right lower lobe. Trachea and bronchi are patent. Upper Abdomen: No acute findings.  Musculoskeletal: Patient with known epidural abscess, not well delineated by CT. No bony destructive change. Review of the MIP images confirms the above findings. IMPRESSION: 1. No pulmonary embolus. 2. Moderate loculated left pleural effusion. Adjacent airspace disease likely compressive atelectasis, pneumonia not excluded in the setting of spinal infection and endocarditis. Mild right pleural thickening or loculated pleural fluid in the upper right hemithorax. Electronically Signed   By: Narda Rutherford M.D.   On: 03/30/2018 23:21   Ct Thoracic Spine Wo Contrast  Result Date: 03/20/2018 CLINICAL DATA:  Progressive back pain.  Swelling of the lower back. EXAM: CT THORACIC SPINE WITHOUT CONTRAST TECHNIQUE: Multidetector CT images of the thoracic were obtained using the standard protocol without intravenous contrast. COMPARISON:  None. FINDINGS: Alignment: Normal. 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 Paraspinal and other soft tissues: There is abnormal gas and fluid in the soft tissues of the right side of the base of the neck and in the right supraclavicular region. Paraspinal soft tissues appear normal throughout the thoracic spine. Disc levels: There is no evidence of disc protrusion or significant disc bulging or spinal or foraminal stenosis or other significant abnormality of the thoracic spine. IMPRESSION: 1. Evidence of cellulitis involving the right side of the base of the neck extending into the right supraclavicular region with fluid and gas in the soft tissues at the base of the right side of the neck. 2. No significant abnormality of the thoracic spine. 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 Electronically Signed   By: Francene Boyers M.D.   On: 03/20/2018 11:25   Ct Lumbar Spine Wo Contrast  Addendum Date: 03/20/2018   ADDENDUM REPORT: 03/20/2018 11:44 ADDENDUM: Critical Value/emergent results were called by telephone at the time of interpretation on 03/20/2018 at 11:30 am to Dr. Sharyn Creamer , who verbally acknowledged these results. Electronically Signed   By: Francene Boyers M.D.   On: 03/20/2018 11:44   Result Date: 03/20/2018 CLINICAL DATA:  Increasing low back pain and soft tissue swelling. EXAM: CT LUMBAR SPINE WITHOUT CONTRAST TECHNIQUE: Multidetector CT imaging of the lumbar spine was performed without intravenous contrast administration. Multiplanar CT image reconstructions were also generated. IV contrast could not be utilized due to the lack of an appropriate IV. COMPARISON:  None. FINDINGS: Segmentation: 5 lumbar type vertebrae. Alignment: Normal. Vertebrae: There is a moth-eaten appearance of the spinous processes of L3 and L4 which is worrisome for osteomyelitis. Bilateral pars defects at L5 with grade 1 spondylolisthesis. 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 Paraspinal and other soft tissues: There is an extensive abnormal fluid collection in the subcutaneous soft tissues of the posterior aspect of the back extending from approximately L1-2 to S3. This fluid collection is lobulated and measures approximately 20 x 9 x 2.5 cm. It is centered slightly to the left of midline and has a mass effect upon the adjacent posterior paraspinal muscles. There is abnormal lucency in the underlying paraspinal muscles which could represent myositis. Disc levels: T11-12: No significant abnormality. Butterfly T12 vertebra. T12-L1: No significant abnormality.  L1-2: Normal disc. Abnormal edema in the posterior paraspinal musculature with adjacent fluid collection in the subcutaneous fat of the posterior aspect of the back as described above. L2-3: Normal disc. L3-4: Normal disc. Lucency in the posterior paraspinal soft tissues extends to the posterior aspect of the thecal sac on image 80 of series 4 but there is no discrete epidural abscess. L4-5: Normal disc.  No evidence of epidural abscess. L5-S1: Grade 1 spondylolisthesis. No disc bulging or protrusion. Bilateral pars defects. No visible epidural  abscess. IMPRESSION: 1. Extensive abnormal fluid collection in the subcutaneous fat of the midline of the back with underlying marked abnormality of the posterior paraspinal musculature from L1-2 through S3. This is worrisome for subcutaneous abscess and myositis. 2. Moth-eaten appearance of the spinous processes of L3 and L4 consistent with osteomyelitis. 3. No discrete epidural abscess. However, the abnormal edema in the paraspinal musculature extends to the posterior aspect of the spinal canal at L3-4. 4. MRI with and without contrast may better define the extent of the soft tissue and infection and could detect epidural extension that is not apparent on this unenhanced CT scan. Electronically Signed: By: Francene Boyers M.D. On: 03/20/2018 11:18   Mr Cervical Spine Wo Contrast  Result Date: 03/30/2018 CLINICAL DATA:  42 year old male with history of MSSA bacteremia with known endocarditis and epidural abscess extending from C2 through the sacrum. Recent episode of hypotension and acute lower extremity weakness. Evaluate abscess and possible spinal cord infarct. EXAM: MRI CERVICAL, THORACIC SPINE WITHOUT CONTRAST TECHNIQUE: Multiplanar and multiecho pulse sequences of the cervical spine, to include the craniocervical junction and cervicothoracic junction, and thoracic and lumbar spine, were obtained without intravenous contrast. COMPARISON:  Prior MRI from 03/22/2018. FINDINGS: MRI CERVICAL SPINE FINDINGS Alignment: Examination technically limited as the patient was unable to tolerate the full length of the exam. Additionally, images provided are degraded by motion artifact. Reversal of the normal cervical lordosis with apex at C5, stable. No interval listhesis or malalignment. Vertebrae: Vertebral body height maintained without acute or interval fracture. Bone marrow signal intensity diffusely decreased on T1 weighted imaging, suspected to be related to anemia and chronic disease. No discrete osseous lesions.  No abnormal marrow edema. No evidence for interval discitis. Cord: Signal intensity within the cervical spinal cord is within normal limits. No findings to suggest interval cord infarction on this motion degraded and limited exam. Previously identified epidural collection involving the ventral epidural space extending from C2 inferiorly is decreased in size, now measuring up to 7 mm in maximal AP diameter at the level of C2. Collection diffusely involves the ventral epidural space, but also is seen dorsally as well. Slightly improved diffuse spinal stenosis with thecal sac patency. Posterior Fossa, vertebral arteries, paraspinal tissues: Visualized brain and posterior fossa within normal limits. Craniocervical junction normal. Scattered edema within the posterior paraspinous soft tissues. Normal intravascular flow voids seen within the vertebral arteries bilaterally. Disc levels: C2-C3: Unremarkable. C3-C4: Small left foraminal protrusion with associated moderate left C4 foraminal stenosis. C4-C5:  Unremarkable. C5-C6: Left eccentric disc bulge with uncovertebral hypertrophy. Moderate left C6 foraminal narrowing. C6-C7: Right foraminal disc protrusion with associated moderate right C7 foraminal stenosis. C7-T1:  Unremarkable. MRI THORACIC SPINE FINDINGS Alignment: Examination markedly limited as the patient was unable to tolerate the full length of the exam. Sagittal T1, T2, and STIR sequences only were performed. No axial images obtained. Vertebral bodies normally aligned with preservation of the normal thoracic kyphosis. Vertebrae: Vertebral body height maintained without evidence for  interval fracture. Butterfly vertebra noted at T12. Diffusely decreased T1 weighted signal intensity throughout the visualized bone marrow, like related to anemia chronic disease. No findings to suggest interval or new discitis or septic arthritis. Cord: Signal intensity within the thoracic spinal cord grossly within normal limits on  these limited sagittal views. No definite cord signal abnormality or cord edema to suggest acute spinal cord infarction. Previously seen diffuse epidural collection appears overall decreased in size from previous, with improved spinal stenosis and thecal sac patency. Paraspinal and other soft tissues: Scattered edema seen within the posterior paraspinous soft tissues at the upper back/cervicothoracic junction. No discrete soft tissue collections. Disc levels: T5-6: Central disc protrusion indenting upon the ventral thoracic spinal cord, stable. IMPRESSION: 1. Technically limited exam due to motion artifact and the patient's inability to tolerate the full length of the exam. Cervical and thoracic spine only was imaged, and only sagittal sequences of the thoracic spine were obtained. 2. No imaging findings to suggest spinal cord infarction identified on this limited exam. 3. Interval improvement in diffuse epidural collection, decreased in size as compared to previous exam with improved diffuse spinal stenosis and thecal sac patency. No new discitis or facet arthritis. Electronically Signed   By: Rise Mu M.D.   On: 03/30/2018 22:52   Mr Thoracic Spine Wo Contrast  Result Date: 03/30/2018 CLINICAL DATA:  42 year old male with history of MSSA bacteremia with known endocarditis and epidural abscess extending from C2 through the sacrum. Recent episode of hypotension and acute lower extremity weakness. Evaluate abscess and possible spinal cord infarct. EXAM: MRI CERVICAL, THORACIC SPINE WITHOUT CONTRAST TECHNIQUE: Multiplanar and multiecho pulse sequences of the cervical spine, to include the craniocervical junction and cervicothoracic junction, and thoracic and lumbar spine, were obtained without intravenous contrast. COMPARISON:  Prior MRI from 03/22/2018. FINDINGS: MRI CERVICAL SPINE FINDINGS Alignment: Examination technically limited as the patient was unable to tolerate the full length of the exam.  Additionally, images provided are degraded by motion artifact. Reversal of the normal cervical lordosis with apex at C5, stable. No interval listhesis or malalignment. Vertebrae: Vertebral body height maintained without acute or interval fracture. Bone marrow signal intensity diffusely decreased on T1 weighted imaging, suspected to be related to anemia and chronic disease. No discrete osseous lesions. No abnormal marrow edema. No evidence for interval discitis. Cord: Signal intensity within the cervical spinal cord is within normal limits. No findings to suggest interval cord infarction on this motion degraded and limited exam. Previously identified epidural collection involving the ventral epidural space extending from C2 inferiorly is decreased in size, now measuring up to 7 mm in maximal AP diameter at the level of C2. Collection diffusely involves the ventral epidural space, but also is seen dorsally as well. Slightly improved diffuse spinal stenosis with thecal sac patency. Posterior Fossa, vertebral arteries, paraspinal tissues: Visualized brain and posterior fossa within normal limits. Craniocervical junction normal. Scattered edema within the posterior paraspinous soft tissues. Normal intravascular flow voids seen within the vertebral arteries bilaterally. Disc levels: C2-C3: Unremarkable. C3-C4: Small left foraminal protrusion with associated moderate left C4 foraminal stenosis. C4-C5:  Unremarkable. C5-C6: Left eccentric disc bulge with uncovertebral hypertrophy. Moderate left C6 foraminal narrowing. C6-C7: Right foraminal disc protrusion with associated moderate right C7 foraminal stenosis. C7-T1:  Unremarkable. MRI THORACIC SPINE FINDINGS Alignment: Examination markedly limited as the patient was unable to tolerate the full length of the exam. Sagittal T1, T2, and STIR sequences only were performed. No axial images obtained. Vertebral bodies normally  aligned with preservation of the normal thoracic  kyphosis. Vertebrae: Vertebral body height maintained without evidence for interval fracture. Butterfly vertebra noted at T12. Diffusely decreased T1 weighted signal intensity throughout the visualized bone marrow, like related to anemia chronic disease. No findings to suggest interval or new discitis or septic arthritis. Cord: Signal intensity within the thoracic spinal cord grossly within normal limits on these limited sagittal views. No definite cord signal abnormality or cord edema to suggest acute spinal cord infarction. Previously seen diffuse epidural collection appears overall decreased in size from previous, with improved spinal stenosis and thecal sac patency. Paraspinal and other soft tissues: Scattered edema seen within the posterior paraspinous soft tissues at the upper back/cervicothoracic junction. No discrete soft tissue collections. Disc levels: T5-6: Central disc protrusion indenting upon the ventral thoracic spinal cord, stable. IMPRESSION: 1. Technically limited exam due to motion artifact and the patient's inability to tolerate the full length of the exam. Cervical and thoracic spine only was imaged, and only sagittal sequences of the thoracic spine were obtained. 2. No imaging findings to suggest spinal cord infarction identified on this limited exam. 3. Interval improvement in diffuse epidural collection, decreased in size as compared to previous exam with improved diffuse spinal stenosis and thecal sac patency. No new discitis or facet arthritis. Electronically Signed   By: Rise Mu M.D.   On: 03/30/2018 22:52   Mr Cervical Spine W Wo Contrast  Result Date: 03/22/2018 CLINICAL DATA:  Spine infection. EXAM: MRI TOTAL SPINE WITHOUT AND WITH CONTRAST TECHNIQUE: Multisequence MR imaging of the spine from the cervical spine to the sacrum was performed prior to and following IV contrast administration. CONTRAST:  6 cc Gadavist intravenous COMPARISON:  CT of the thoracic and lumbar  spine from 2 days ago FINDINGS: MRI CERVICAL SPINE FINDINGS Alignment: Normal Vertebrae: No evidence of osseous infection. Canal/Cord: There is extensive spinal fluid collection preferentially in the ventral but also in the right more than left dorsal canal. Subarachnoid space is diffusely effaced. Maximal thickness is posterior to C2 at 9 mm. No cord signal abnormality. Posterior Fossa, vertebral arteries, paraspinal tissues: No retropharyngeal or other discrete soft tissue collection. Disc levels: C2-3: Unremarkable. C3-4: Small left foraminal protrusion with moderate narrowing C4-5: Unremarkable. C5-6: Disc narrowing and bulging with asymmetric left uncovertebral spurring. Left foraminal impingement C6-7: Right foraminal protrusion mild narrowing. C7-T1:Unremarkable. MRI THORACIC SPINE FINDINGS Alignment:  Normal Vertebrae: No evidence of osteomyelitis or discitis. T12 butterfly vertebra. Canal/Cord: Cervical ventral epidural collection continues throughout the thoracic levels. There is also a focal dorsal component at T3-4 to T5-6, where thecal sac effacement is accentuated and there is cord flattening. No cord edema. Paraspinal and other soft tissues: Paraspinous phlegmon on the left at T8-T12, with new complex left pleural effusion and lower lobe atelectasis Disc levels: T5-6 central disc protrusion. MRI LUMBAR SPINE FINDINGS Segmentation:  5 lumbar type vertebral bodies Alignment:  Grade 1 anterolisthesis at L5-S1. Vertebrae: Marrow edema and heterogeneous enhancement within the L2, L3, and L4 spinous processes. No discitis or facet edema. Conus medullaris: Extends to the L1 level and is non edematous. There is extensive epidural collection completely effacing the thecal sac throughout the lumbar spine until L4-5 and below where the collection becomes ventral and right eccentric. The infection communicates with extensive bilateral abscess within the intrinsic back muscles via the interspinous space at L3-4.  Patient had recent subcutaneous collection and left buttocks collection drainage, with packing seen in place. This midline, upper subcutaneous collection communicates with  the paravertebral abscess along its superior margin based on postcontrast axial images. Paraspinal and other soft tissues: As above.  Distended bladder Disc levels: Chronic bilateral pars defects at L5. Critical Value/emergent results were called by telephone at the time of interpretation on 03/22/2018 at 3:04 pm to Dr. Thedore Mins , who verbally acknowledged these results. IMPRESSION: 1. Epidural abscess from C2 to sacrum as described. Maximal cord compression from T3-4 to T5-6. The thecal sac is completely effaced from L1 to L4-5. At the L3-4 interspinous space the spinal abscess communicates with large bilateral abscesses within the intrinsic back muscles. There is osteomyelitis of the L2, L3, and L4 spinous processes. No discitis or facet arthritis. 2. Left paravertebral abscess along the lower thoracic spine with small left empyema that is new from CT 2 days ago. 3. Distended bladder Electronically Signed   By: Marnee Spring M.D.   On: 03/22/2018 15:11   Mr Thoracic Spine W Wo Contrast  Result Date: 03/22/2018 CLINICAL DATA:  Spine infection. EXAM: MRI TOTAL SPINE WITHOUT AND WITH CONTRAST TECHNIQUE: Multisequence MR imaging of the spine from the cervical spine to the sacrum was performed prior to and following IV contrast administration. CONTRAST:  6 cc Gadavist intravenous COMPARISON:  CT of the thoracic and lumbar spine from 2 days ago FINDINGS: MRI CERVICAL SPINE FINDINGS Alignment: Normal Vertebrae: No evidence of osseous infection. Canal/Cord: There is extensive spinal fluid collection preferentially in the ventral but also in the right more than left dorsal canal. Subarachnoid space is diffusely effaced. Maximal thickness is posterior to C2 at 9 mm. No cord signal abnormality. Posterior Fossa, vertebral arteries, paraspinal tissues: No  retropharyngeal or other discrete soft tissue collection. Disc levels: C2-3: Unremarkable. C3-4: Small left foraminal protrusion with moderate narrowing C4-5: Unremarkable. C5-6: Disc narrowing and bulging with asymmetric left uncovertebral spurring. Left foraminal impingement C6-7: Right foraminal protrusion mild narrowing. C7-T1:Unremarkable. MRI THORACIC SPINE FINDINGS Alignment:  Normal Vertebrae: No evidence of osteomyelitis or discitis. T12 butterfly vertebra. Canal/Cord: Cervical ventral epidural collection continues throughout the thoracic levels. There is also a focal dorsal component at T3-4 to T5-6, where thecal sac effacement is accentuated and there is cord flattening. No cord edema. Paraspinal and other soft tissues: Paraspinous phlegmon on the left at T8-T12, with new complex left pleural effusion and lower lobe atelectasis Disc levels: T5-6 central disc protrusion. MRI LUMBAR SPINE FINDINGS Segmentation:  5 lumbar type vertebral bodies Alignment:  Grade 1 anterolisthesis at L5-S1. Vertebrae: Marrow edema and heterogeneous enhancement within the L2, L3, and L4 spinous processes. No discitis or facet edema. Conus medullaris: Extends to the L1 level and is non edematous. There is extensive epidural collection completely effacing the thecal sac throughout the lumbar spine until L4-5 and below where the collection becomes ventral and right eccentric. The infection communicates with extensive bilateral abscess within the intrinsic back muscles via the interspinous space at L3-4. Patient had recent subcutaneous collection and left buttocks collection drainage, with packing seen in place. This midline, upper subcutaneous collection communicates with the paravertebral abscess along its superior margin based on postcontrast axial images. Paraspinal and other soft tissues: As above.  Distended bladder Disc levels: Chronic bilateral pars defects at L5. Critical Value/emergent results were called by telephone at  the time of interpretation on 03/22/2018 at 3:04 pm to Dr. Thedore Mins , who verbally acknowledged these results. IMPRESSION: 1. Epidural abscess from C2 to sacrum as described. Maximal cord compression from T3-4 to T5-6. The thecal sac is completely effaced from L1 to  L4-5. At the L3-4 interspinous space the spinal abscess communicates with large bilateral abscesses within the intrinsic back muscles. There is osteomyelitis of the L2, L3, and L4 spinous processes. No discitis or facet arthritis. 2. Left paravertebral abscess along the lower thoracic spine with small left empyema that is new from CT 2 days ago. 3. Distended bladder Electronically Signed   By: Marnee Spring M.D.   On: 03/22/2018 15:11   Mr Lumbar Spine W Wo Contrast  Result Date: 03/22/2018 CLINICAL DATA:  Spine infection. EXAM: MRI TOTAL SPINE WITHOUT AND WITH CONTRAST TECHNIQUE: Multisequence MR imaging of the spine from the cervical spine to the sacrum was performed prior to and following IV contrast administration. CONTRAST:  6 cc Gadavist intravenous COMPARISON:  CT of the thoracic and lumbar spine from 2 days ago FINDINGS: MRI CERVICAL SPINE FINDINGS Alignment: Normal Vertebrae: No evidence of osseous infection. Canal/Cord: There is extensive spinal fluid collection preferentially in the ventral but also in the right more than left dorsal canal. Subarachnoid space is diffusely effaced. Maximal thickness is posterior to C2 at 9 mm. No cord signal abnormality. Posterior Fossa, vertebral arteries, paraspinal tissues: No retropharyngeal or other discrete soft tissue collection. Disc levels: C2-3: Unremarkable. C3-4: Small left foraminal protrusion with moderate narrowing C4-5: Unremarkable. C5-6: Disc narrowing and bulging with asymmetric left uncovertebral spurring. Left foraminal impingement C6-7: Right foraminal protrusion mild narrowing. C7-T1:Unremarkable. MRI THORACIC SPINE FINDINGS Alignment:  Normal Vertebrae: No evidence of osteomyelitis or  discitis. T12 butterfly vertebra. Canal/Cord: Cervical ventral epidural collection continues throughout the thoracic levels. There is also a focal dorsal component at T3-4 to T5-6, where thecal sac effacement is accentuated and there is cord flattening. No cord edema. Paraspinal and other soft tissues: Paraspinous phlegmon on the left at T8-T12, with new complex left pleural effusion and lower lobe atelectasis Disc levels: T5-6 central disc protrusion. MRI LUMBAR SPINE FINDINGS Segmentation:  5 lumbar type vertebral bodies Alignment:  Grade 1 anterolisthesis at L5-S1. Vertebrae: Marrow edema and heterogeneous enhancement within the L2, L3, and L4 spinous processes. No discitis or facet edema. Conus medullaris: Extends to the L1 level and is non edematous. There is extensive epidural collection completely effacing the thecal sac throughout the lumbar spine until L4-5 and below where the collection becomes ventral and right eccentric. The infection communicates with extensive bilateral abscess within the intrinsic back muscles via the interspinous space at L3-4. Patient had recent subcutaneous collection and left buttocks collection drainage, with packing seen in place. This midline, upper subcutaneous collection communicates with the paravertebral abscess along its superior margin based on postcontrast axial images. Paraspinal and other soft tissues: As above.  Distended bladder Disc levels: Chronic bilateral pars defects at L5. Critical Value/emergent results were called by telephone at the time of interpretation on 03/22/2018 at 3:04 pm to Dr. Thedore Mins , who verbally acknowledged these results. IMPRESSION: 1. Epidural abscess from C2 to sacrum as described. Maximal cord compression from T3-4 to T5-6. The thecal sac is completely effaced from L1 to L4-5. At the L3-4 interspinous space the spinal abscess communicates with large bilateral abscesses within the intrinsic back muscles. There is osteomyelitis of the L2, L3,  and L4 spinous processes. No discitis or facet arthritis. 2. Left paravertebral abscess along the lower thoracic spine with small left empyema that is new from CT 2 days ago. 3. Distended bladder Electronically Signed   By: Marnee Spring M.D.   On: 03/22/2018 15:11   Dg Chest [facility] 1 View  Result  Date: 03/30/2018 CLINICAL DATA:  Shortness of breath EXAM: PORTABLE CHEST 1 VIEW COMPARISON:  03/30/2018 at 0227 hours FINDINGS: Moderate layering left pleural effusion, increased. Left lower lobe opacity, atelectasis versus pneumonia. Right lung is clear.  No pneumothorax. The heart is normal in size. IMPRESSION: Moderate layering left pleural effusion, increased. Left lower lobe opacity, atelectasis versus pneumonia. Electronically Signed   By: Charline Bills M.D.   On: 03/30/2018 19:06   Dg Chest Port 1 View  Result Date: 03/30/2018 CLINICAL DATA:  Chest pain EXAM: PORTABLE CHEST 1 VIEW COMPARISON:  None. FINDINGS: Retrocardiac opacity, atelectasis versus pneumonia. Possible small left pleural effusion. Right lung is clear. No pneumothorax. The heart is normal in size. IMPRESSION: Retrocardiac opacity, atelectasis versus pneumonia. Possible small left pleural effusion. Electronically Signed   By: Charline Bills M.D.   On: 03/30/2018 02:49   [place] Ekg Site Rite  Result Date: 04/04/2018 If Site Rite image not attached, placement could not be confirmed due to current cardiac rhythm.  [place] Ekg Site Rite  Result Date: 03/21/2018 If Site Rite image not attached, placement could not be confirmed due to current cardiac rhythm.    LOS: 17 days   Signature  Susa Raring M.D on 04/06/2018 at 10:39 AM  To page go to [email or web address] - password [facility]

## 2018-04-07 LAB — CBC
HEMATOCRIT: 27.2 % — AB (ref 39.0–52.0)
Hemoglobin: 8.1 g/dL — ABNORMAL LOW (ref 13.0–17.0)
MCH: 28.9 pg (ref 26.0–34.0)
MCHC: 29.8 g/dL — ABNORMAL LOW (ref 30.0–36.0)
MCV: 97.1 fL (ref 80.0–100.0)
NRBC: 0 % (ref 0.0–0.2)
Platelets: 382 10*3/uL (ref 150–400)
RBC: 2.8 MIL/uL — ABNORMAL LOW (ref 4.22–5.81)
RDW: 13.8 % (ref 11.5–15.5)
WBC: 9.1 10*3/uL (ref 4.0–10.5)

## 2018-04-07 LAB — GLUCOSE, CAPILLARY
GLUCOSE-CAPILLARY: 188 mg/dL — AB (ref 70–99)
GLUCOSE-CAPILLARY: 189 mg/dL — AB (ref 70–99)
GLUCOSE-CAPILLARY: 201 mg/dL — AB (ref 70–99)
Glucose-Capillary: 219 mg/dL — ABNORMAL HIGH (ref 70–99)
Glucose-Capillary: 151 mg/dL — ABNORMAL HIGH (ref 70–99)

## 2018-04-07 LAB — BASIC METABOLIC PANEL
ANION GAP: 9 (ref 5–15)
BUN: 8 mg/dL (ref 6–20)
CALCIUM: 7.8 mg/dL — AB (ref 8.9–10.3)
CO2: 31 mmol/L (ref 22–32)
CREATININE: 0.7 mg/dL (ref 0.61–1.24)
Chloride: 97 mmol/L — ABNORMAL LOW (ref 98–111)
GFR calc non Af Amer: >60 mL/min (ref >60)
Glucose, Bld: 289 mg/dL — ABNORMAL HIGH (ref 70–99)
Potassium: 3.6 mmol/L (ref 3.5–5.1)
SODIUM: 137 mmol/L (ref 135–145)

## 2018-04-07 LAB — MAGNESIUM: MAGNESIUM: 1.7 mg/dL (ref 1.7–2.4)

## 2018-04-07 NOTE — Progress Notes (Signed)
Subjective: Patient reports "I'm doing ok"  Objective: Vital signs in last 24 hours: Temp:  [98.9 F (37.2 C)-100.4 F (38 C)] 99.2 F (37.3 C) (10/09 0524) Pulse Rate:  [77-90] 90 (10/09 0524) Resp:  [15-17] 17 (10/09 0524) BP: (106-144)/(64-86) 144/86 (10/09 0524) SpO2:  [95 %-98 %] 96 % (10/09 0524)  Intake/Output from previous day: 10/08 0701 - 10/09 0700 In: 996.3 [P.O.:360; I.V.:36.3; IV Piggyback:600] Out: 2800 [Urine:2800] Intake/Output this shift: No intake/output data recorded.  Sitting in chair, alert and conversant. Neuro exam unchanged. MAEW with full strength. Lumbar wounds managed by wound therapy.  Lab Results: Recent Labs    04/06/18 0355 04/07/18 0357  WBC 10.6* 9.1  HGB 7.2* 8.1*  HCT 23.8* 27.2*  PLT 328 382   BMET Recent Labs    04/06/18 0355 04/07/18 0357  NA 136 137  K 3.9 3.6  CL 96* 97*  CO2 34* 31  GLUCOSE 210* 289*  BUN 6 8  CREATININE 0.61 0.70  CALCIUM 7.5* 7.8*    Studies/Results: No results found.  Assessment/Plan:   LOS: 18 days  supportive care continues   Georgiann Cocker 04/07/2018, 8:08 AM

## 2018-04-07 NOTE — Progress Notes (Signed)
PROGRESS NOTE        PATIENT DETAILS  Name: Tony Long  Age: 42 y.o.  Sex: male Date of Birth: 1976-03-03 Admit Date: 03/20/2018 Admitting Physician Kendell Bane, MD  ZOX:[id number], Rolm Gala, FNP   Brief Narrative:  [name] a 42 y.o. male history of IVDA, DM-2, hypertension admitted for worsening back pain-further evaluation revealed MSSA bacteremia with tricuspid valve endocarditis, epidural abscess involving C2 through sacrum, and numerous soft tissue back abscesses.  Evaluated by general surgery-underwent I&D of her lower back soft tissue abscess, evaluated by neurosurgery-not felt to be a candidate for decompressive surgery due to extensive nature of the disease and lack of any significant neurological findings.  ID following He was initially kept on IV cefazolin and then antibiotic coverage was broadened to Vancomycin and Rocephin on 03/30/2018 by ID.  His Rocephin  stopped on 04/06/2018, vancomycin will stop on 04/16/2018 thereafter 4 weeks of Keflex 500 p.o. twice daily per ID.Marland Kitchen   See below for further details   Subjective: P T-max 100.4, no chills, no vomiting or diarrhea  Assessment/Plan:   MSSA Bacteremia with tricuspid valve endocarditis, extensive epidural abscess (C2 through sacrum) and large soft tissue lower back abscess  : due to extensive nature of the epidural abscess and lack of concerning neurological findings-neurosurgery does not recommend decompressive surgery. Seen by ID and current recommendation is total of 4 weeks of antibiotics with a tentative stop date now changed by ID to 04/16/2018 that after 4 weeks of Keflex 500 p.o. twice daily.  Given history of IVDA-not a candidate for outpatient IV antimicrobial therapy. He has received a PICC line, continue IV antibiotics through PEG.  Patient doing fairly well, hospital course was complicated by an episode of sepsis which arose on 03/30/2018, at that time he was frankly septic and had  developed some transient left lower extremity weakness as well of unclear etiology, at that point he had to be briefly moved to ICU, at that point antibiotics were broadened and he is now stable.    Patient aware of the extensive nature of this infection-with life-threatening and life disabling risks including quadriplegia along with life-threatening sepsis with this present infection which is nonsurgical.  This was explained by me to the patient several times along with his parents bedside on 03/22/2018 and 03/23/2018, 03/30/2018.  Repeat Surveillance cultures are negative and he is going to receive a PICC line on 04/05/2018.   Cocaine/Substance abuse: Withdrawal symptoms much better-on 9/25 patient developed excessive sedation-Klonopin/Flexeril has been changed to as needed dosing-clonidine being tapered down.  On 03/28/2018 he requested me that he be put back on his Suboxone, he does not want short-acting pain medications anymore, changes will be made per his request.    Polysubstance abuse/IVDA: Counseled to quit.  Anemia.  Normocytic, he had some chronic anemia upon admission as well and now worse due to him dilution from IV fluids also some element of iron deficiency per anemia panel likely due to multiple blood draws.  No signs of ongoing bleeding or acute blood loss, s/p  1 unit of packed RBC on 04/06/2018 and monitor, hgb is 9.1   Mild lower extremity leg edema.  ---improving with TED stockings and gentle Lasix on 04/03/2018 & post RBC on 04/06/2018 and monitor.    New onset of left-sided pleuritic chest pain starting on late night 03/29/2018.  Nonspecific EKG changes stable on combination of aspirin, beta-blocker and statin.  Echocardiogram shows no wall motion abnormality and preserved EF, pain is completely resolved.  Hypertension: Monitor on beta-blocker.  Acute urinary retention: Continue Foley and Flomax, trial of Foley removal failed on 04/03/2018, likely combination of lack of activity and  spine infection.  Severe hypokalemia and hypomagnesemia. -Replace and recheck  DM-2: Poor outpatient control, last A1c 12.6, continue Lantus and sliding scale.  Lantus dose adjusted for better control.   CBG (last 3)  Recent Labs    04/07/18 0752 04/07/18 1225 04/07/18 1444  GLUCAP 188* 189* 219*   Lab Results  Component Value Date   HGBA1C 12.6 (A) 02/26/2018     DVT Prophylaxis: Prophylactic heparin  Code Status: Full code  Family Communication: Parents multiple times clearly explained prognosis remains poor, now DNR.  They have clear understanding of his medical situation.  Disposition Plan: Home after completing IV antibiotics   Antimicrobial agents: Anti-infectives (From admission, onward)   Start     Dose/Rate Route Frequency Ordered Stop   04/01/18 1300  nafcillin 2 g in sodium chloride 0.9 % 100 mL IVPB     2 g 200 mL/hr over 30 Minutes Intravenous Every 4 hours 04/01/18 1242     04/01/18 1200  nafcillin injection 2 g  Status:  Discontinued     2 g Intravenous Every 4 hours 04/01/18 1129 04/01/18 1242   03/31/18 0200  vancomycin (VANCOCIN) IVPB 750 mg/150 ml premix  Status:  Discontinued     750 mg 150 mL/hr over 60 Minutes Intravenous Every 8 hours 03/30/18 1628 04/01/18 1129   03/31/18 0100  meropenem (MERREM) 1 g in sodium chloride 0.9 % 100 mL IVPB  Status:  Discontinued     1 g 200 mL/hr over 30 Minutes Intravenous Every 8 hours 03/30/18 1628 04/01/18 1129   03/30/18 1630  vancomycin (VANCOCIN) 1,500 mg in sodium chloride 0.9 % 500 mL IVPB     1,500 mg 250 mL/hr over 120 Minutes Intravenous NOW 03/30/18 1619 03/30/18 1925   03/30/18 1630  meropenem (MERREM) 2 g in sodium chloride 0.9 % 100 mL IVPB     2 g 200 mL/hr over 30 Minutes Intravenous NOW 03/30/18 1619 03/30/18 1754   03/21/18 0930  ceFAZolin (ANCEF) IVPB 2g/100 mL premix  Status:  Discontinued     2 g 200 mL/hr over 30 Minutes Intravenous Every 8 hours 03/21/18 0920 03/30/18 1616       Procedures:   MRI C, T and L-spine done 03/30/2018 and 03/31/2018.    CT angiogram chest 03/30/2018.  1. No pulmonary embolus. 2. Moderate loculated left pleural effusion. Adjacent airspace disease likely compressive atelectasis, pneumonia not excluded in the setting of spinal infection and endocarditis. Mild right pleural thickening or loculated pleural fluid in the upper right hemithorax.  Repeat echocardiogram 03/30/2018 - Left ventricle: The cavity size was normal. Wall thickness was normal. Systolic function was normal. The estimated ejection fraction was in the range of 60% to 65%. Wall motion was normal; there were no regional wall motion abnormalities. Left ventricular diastolic function parameters were normal.  TEE - - Left ventricle: The cavity size was normal. Wall thickness wasnormal. Systolic function was normal. The estimated ejectionfraction was in the range of 60% to 65%. - Aortic valve: No evidence of vegetation. - Mitral valve: No evidence of vegetation. - Left atrium: No evidence of thrombus in the atrial cavity or appendage. No evidence of thrombus in the appendage. -  Right atrium: No evidence of thrombus in the atrial cavity orappendage. - Tricuspid valve: There was a vegetation. There was a small (1.2cmx .6cm), mobile vegetation on the septal leaflet. There was mildregurgitation. - Pulmonic valve: No evidence of vegetation.  Impressions:   Tricuspid valve endocarditis. Discussed with primary team   MRI C-T-L Spine - 1. Epidural abscess from C2 to sacrum as described. Maximal cord compression from T3-4 to T5-6. The thecal sac is completely effaced from L1 to L4-5. At the L3-4 interspinous space the spinal abscess communicates with large bilateral abscesses within the intrinsic back muscles. There is osteomyelitis of the L2, L3, and L4 spinous processes. No discitis or facet arthritis. 2. Left paravertebral abscess along the lower thoracic spine with small left  empyema that is new from CT 2 days ago. 3. Distended bladder  CT - 1. Evidence of cellulitis involving the right side of the base of the neck extending into the right supraclavicular region with fluid and gas in the soft tissues at the base of the right side of the neck. 2. No significant abnormality of the thoracic spine. 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  CT -  1. Extensive abnormal fluid collection in the subcutaneous fat of the midline of the back with underlying marked abnormality of the posterior paraspinal musculature from L1-2 through S3. This is worrisome for subcutaneous abscess and myositis. 2. Moth-eaten appearance of the spinous processes of L3 and L4 consistent with osteomyelitis. 3. No discrete epidural abscess. However, the abnormal edema in the paraspinal musculature extends to the posterior aspect of the spinal canal at L3-4. 4. MRI with and without contrast may better define the extent of the soft tissue and infection and could detect epidural extension that is not apparent on this unenhanced CT scan.  9/22>> PICC line  I&D by [facility] of back soft tissue abscess on -  03/22/18    CONSULTS:  N.Surgery, ID, general surgery, PCCM    MEDICATIONS:  Scheduled Meds:  . aspirin  81 mg Oral Daily  . atorvastatin  40 mg Oral q1800  . buprenorphine-naloxone  2 tablet Sublingual Daily  . feeding supplement (PRO-STAT SUGAR FREE 64)  30 mL Oral TID WC  . gabapentin  300 mg Oral TID  . heparin injection (subcutaneous)  5,000 Units Subcutaneous Q8H  . insulin aspart  0-5 Units Subcutaneous QHS  . insulin aspart  0-9 Units Subcutaneous TID WC  . insulin aspart  4 Units Subcutaneous TID WC  . insulin glargine  25 Units Subcutaneous QHS  . metoprolol tartrate  50 mg Oral BID  . nicotine  21 mg Transdermal Daily  . polyethylene glycol  17 g Oral BID  . senna-docusate  2 tablet Oral QHS  . tamsulosin  0.4 mg Oral Daily   Continuous Infusions: . nafcillin IV 2 g (04/07/18 1636)    PRN Meds:.acetaminophen, bisacodyl, clonazepam, cyclobenzaprine, hydrALAZINE, LORazepam, magnesium hydroxide, metoprolol tartrate, nitroGLYCERIN, sodium chloride flush, sodium phosphate   PHYSICAL EXAM: Vital signs: Vitals:   04/06/18 1530 04/06/18 1755 04/06/18 2128 04/07/18 0524  BP: 118/75 114/69 106/64 (!) 144/86  Pulse: 81 79 86 90  Resp: 15 16 17 17   Temp: 100 F (37.8 C) 99.3 F (37.4 C) 98.9 F (37.2 C) 99.2 F (37.3 C)  TempSrc: Oral Oral Oral Oral  SpO2: 95% 95% 97% 96%  Weight:      Height:       Filed Weights   03/21/18 0602  Weight: 68 kg   Body mass  index is 20.92 kg/m.   Exam  Awake Alert, Oriented X 3, No new F.N deficits, Normal affect [place].AT,PERRAL Supple Neck,No JVD,   Lungs- CTA, fair air movement bilaterally CV-RRR, S1-S2 normal  Abd-+ve B.Sounds, Abd Soft, No tenderness,   LE--- 2+ ankle edema bilaterally Neuro-L Leg 5/5    LABORATORY DATA: CBC: Recent Labs  Lab 04/02/18 0625 04/05/18 0754 04/05/18 1133 04/06/18 0355 04/07/18 0357  WBC 22.2* 10.2  --  10.6* 9.1  HGB 9.1* 6.7* 7.3* 7.2* 8.1*  HCT 28.7* 22.2* 23.3* 23.8* 27.2*  MCV 96.6 98.7  --  98.8 97.1  PLT 485* 306  --  328 382    Basic Metabolic Panel: Recent Labs  Lab 04/02/18 0625 04/04/18 0634 04/05/18 0754 04/06/18 0355 04/07/18 0357  NA 140 138 136 136 137  K 3.6 3.0* 2.4* 3.9 3.6  CL 97* 100 98 96* 97*  CO2 30 32 33* 34* 31  GLUCOSE 360* 258* 169* 210* 289*  BUN 15 10 <5* 6 8  CREATININE 0.76 0.66 0.49* 0.61 0.70  CALCIUM 8.4* 7.7* 6.6* 7.5* 7.8*  MG 2.0 1.6* 1.5* 1.9 1.7    GFR: Estimated Creatinine Clearance: 116.9 mL/min (by C-G formula based on SCr of 0.7 mg/dL).  Liver Function Tests: Recent Labs  Lab 04/02/18 0625  AST 22  ALT 14  ALKPHOS 140*  BILITOT 0.7  PROT 6.5  ALBUMIN 1.5*   No results for input(s): LIPASE, AMYLASE in the last 168 hours. No results for input(s): AMMONIA in the last 168 hours.  Coagulation Profile: No results  for input(s): INR, PROTIME in the last 168 hours.  Cardiac Enzymes: No results for input(s): CKTOTAL, CKMB, CKMBINDEX, TROPONINI in the last 168 hours.  BNP (last 3 results) No results for input(s): PROBNP in the last 8760 hours.  HbA1C: No results for input(s): HGBA1C in the last 72 hours.  CBG: Recent Labs  Lab 04/06/18 1701 04/06/18 2124 04/07/18 0752 04/07/18 1225 04/07/18 1444  GLUCAP 202* 211* 188* 189* 219*    Lipid Profile: No results for input(s): CHOL, HDL, LDLCALC, TRIG, CHOLHDL, LDLDIRECT in the last 72 hours.  Thyroid Function Tests: No results for input(s): TSH, T4TOTAL, FREET4, T3FREE, THYROIDAB in the last 72 hours.  Anemia Panel: No results for input(s): VITAMINB12, FOLATE, FERRITIN, TIBC, IRON, RETICCTPCT in the last 72 hours.  Urine analysis:    Component Value Date/Time   COLORURINE YELLOW 03/30/2018 1930   APPEARANCEUR CLEAR 03/30/2018 1930   LABSPEC 1.009 03/30/2018 1930   PHURINE 6.0 03/30/2018 1930   GLUCOSEU 50 (A) 03/30/2018 1930   HGBUR SMALL (A) 03/30/2018 1930   BILIRUBINUR NEGATIVE 03/30/2018 1930   BILIRUBINUR small 02/26/2018 1202   KETONESUR NEGATIVE 03/30/2018 1930   PROTEINUR NEGATIVE 03/30/2018 1930   UROBILINOGEN 1.0 02/26/2018 1202   UROBILINOGEN 1.0 12/11/2016 1020   NITRITE NEGATIVE 03/30/2018 1930   LEUKOCYTESUR TRACE (A) 03/30/2018 1930    Sepsis Labs: Lactic Acid, Venous    Component Value Date/Time   LATICACIDVEN 1.2 03/30/2018 1837    MICROBIOLOGY: Recent Results (from the past 240 hour(s))  Culture, blood (single)     Status: None   Collection Time: 03/30/18  4:12 PM  Result Value Ref Range Status   Specimen Description BLOOD RIGHT ANTECUBITAL  Final   Special Requests   Final    BOTTLES DRAWN AEROBIC ONLY Blood Culture adequate volume   Culture   Final    NO GROWTH 5 DAYS Performed at [facility], [address], [place], [place] [address]    Report Status 04/04/2018 FINAL  Final  Culture,  Urine     Status: None   Collection Time: 03/30/18  4:20 PM  Result Value Ref Range Status   Specimen Description URINE, CATHETERIZED  Final   Special Requests NONE  Final   Culture   Final    NO GROWTH Performed at [facility], [address], [place], [place] [address]    Report Status 04/01/2018 FINAL  Final  Culture, blood (Routine X 2) w Reflex to ID Panel     Status: None   Collection Time: 04/01/18  9:49 AM  Result Value Ref Range Status   Specimen Description BLOOD BLOOD RIGHT HAND  Final   Special Requests   Final    BOTTLES DRAWN AEROBIC ONLY Blood Culture results may not be optimal due to an inadequate volume of blood received in culture bottles   Culture   Final    NO GROWTH 5 DAYS Performed at [facility], [address], [place], [place] [address]    Report Status 04/06/2018 FINAL  Final  Culture, blood (Routine X 2) w Reflex to ID Panel     Status: None   Collection Time: 04/01/18 10:02 AM  Result Value Ref Range Status   Specimen Description BLOOD RIGHT ANTECUBITAL  Final   Special Requests   Final    BOTTLES DRAWN AEROBIC ONLY Blood Culture adequate volume   Culture   Final    NO GROWTH 5 DAYS Performed at [facility], [address] [place], [place] [address]    Report Status 04/06/2018 FINAL  Final    RADIOLOGY STUDIES/RESULTS:  Ct Angio Chest Pe W Or Wo Contrast  Result Date: 03/30/2018 CLINICAL DATA:  Shortness of breath PE suspected, high pretest prob. Patient with known epidural abscess and endocarditis. Now with left-sided chest pain. EXAM: CT ANGIOGRAPHY CHEST WITH CONTRAST TECHNIQUE: Multidetector CT imaging of the chest was performed using the standard protocol during bolus administration of intravenous contrast. Multiplanar CT image reconstructions and MIPs were obtained to evaluate the vascular anatomy. CONTRAST:  54mL ISOVUE-370 IOPAMIDOL (ISOVUE-370) INJECTION 76% COMPARISON:  Chest radiographs earlier this day. FINDINGS:  Cardiovascular: There are no filling defects within the pulmonary arteries to suggest pulmonary embolus. The thoracic aorta is normal in caliber without dissection. Mild cardiomegaly. Small amount pericardial fluid. Mediastinum/Nodes: Loculated left pleural effusion tracks along the left aspect of the mediastinum. Small left hilar nodes without bulky adenopathy. No enlarged mediastinal nodes. No dominant thyroid nodule. The esophagus is decompressed. Lungs/Pleura: Moderate left pleural effusion is loculated and tracks along the posterior, lateral, and medial hemithorax. Adjacent airspace disease in the left lower lobe, favoring compressive atelectasis, with adjacent compressive atelectasis in the right upper lobe. Pleural thickening versus loculated pleural effusion the upper medial right hemithorax, with additional areas of pleural thickening dependently. Dependent atelectasis in the right lower lobe. Trachea and bronchi are patent. Upper Abdomen: No acute findings. Musculoskeletal: Patient with known epidural abscess, not well delineated by CT. No bony destructive change. Review of the MIP images confirms the above findings. IMPRESSION: 1. No pulmonary embolus. 2. Moderate loculated left pleural effusion. Adjacent airspace disease likely compressive atelectasis, pneumonia not excluded in the setting of spinal infection and endocarditis. Mild right pleural thickening or loculated pleural fluid in the upper right hemithorax. Electronically Signed   By: Narda Rutherford M.D.   On: 03/30/2018 23:21   Ct Thoracic Spine Wo Contrast  Result Date: 03/20/2018 CLINICAL DATA:  Progressive back pain.  Swelling of the lower back. EXAM: CT THORACIC SPINE WITHOUT CONTRAST TECHNIQUE: Multidetector CT images of the thoracic were obtained using the standard protocol without intravenous contrast. COMPARISON:  None. FINDINGS: Alignment: Normal. 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 Paraspinal and other soft tissues:  There is abnormal gas and fluid in the soft tissues of the right side of the base of the neck and in the right supraclavicular region. Paraspinal soft tissues appear normal throughout the thoracic spine. Disc levels: There is no evidence of disc protrusion or significant disc bulging or spinal or foraminal stenosis or other significant abnormality of the thoracic spine. IMPRESSION: 1. Evidence of cellulitis involving the right side of the base of the neck extending into the right supraclavicular region with fluid and gas in the soft tissues at the base of the right side of the neck. 2. No significant abnormality of the thoracic spine. 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 Electronically Signed   By: Francene Boyers M.D.   On: 03/20/2018 11:25   Ct Lumbar Spine Wo Contrast  Addendum Date: 03/20/2018   ADDENDUM REPORT: 03/20/2018 11:44 ADDENDUM: Critical Value/emergent results were called by telephone at the time of interpretation on 03/20/2018 at 11:30 am to Dr. Sharyn Creamer , who verbally acknowledged these results. Electronically Signed   By: Francene Boyers M.D.   On: 03/20/2018 11:44   Result Date: 03/20/2018 CLINICAL DATA:  Increasing low back pain and soft tissue swelling. EXAM: CT LUMBAR SPINE WITHOUT CONTRAST TECHNIQUE: Multidetector CT imaging of the lumbar spine was performed without intravenous contrast administration. Multiplanar CT image reconstructions were also generated. IV contrast could not be utilized due to the lack of an appropriate IV. COMPARISON:  None. FINDINGS: Segmentation: 5 lumbar type vertebrae. Alignment: Normal. Vertebrae: There is a moth-eaten appearance of the spinous processes of L3 and L4 which is worrisome for osteomyelitis. Bilateral pars defects at L5 with grade 1 spondylolisthesis. 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 Paraspinal and other soft tissues: There is an extensive abnormal fluid collection in the subcutaneous soft tissues of the posterior aspect of the back extending  from approximately L1-2 to S3. This fluid collection is lobulated and measures approximately 20 x 9 x 2.5 cm. It is centered slightly to the left of midline and has a mass effect upon the adjacent posterior paraspinal muscles. There is abnormal lucency in the underlying paraspinal muscles which could represent myositis. Disc levels: T11-12: No significant abnormality. Butterfly T12 vertebra. T12-L1: No significant abnormality. L1-2: Normal disc. Abnormal edema in the posterior paraspinal musculature with adjacent fluid collection in the subcutaneous fat of the posterior aspect of the back as described above. L2-3: Normal disc. L3-4: Normal disc. Lucency in the posterior paraspinal soft tissues extends to the posterior aspect of the thecal sac on image 80 of series 4 but there is no discrete epidural abscess. L4-5: Normal disc.  No evidence of epidural abscess. L5-S1: Grade 1 spondylolisthesis. No disc bulging or protrusion. Bilateral pars defects. No visible epidural abscess. IMPRESSION: 1. Extensive abnormal fluid collection in the subcutaneous fat of the midline of the back with underlying marked abnormality of the posterior paraspinal musculature from L1-2 through S3. This is worrisome for subcutaneous abscess and myositis. 2. Moth-eaten appearance of the spinous processes of L3 and L4 consistent with osteomyelitis. 3. No discrete epidural abscess. However, the abnormal edema in the paraspinal musculature extends to the posterior aspect of the spinal canal at L3-4. 4. MRI with and  without contrast may better define the extent of the soft tissue and infection and could detect epidural extension that is not apparent on this unenhanced CT scan. Electronically Signed: By: Francene Boyers M.D. On: 03/20/2018 11:18   Mr Cervical Spine Wo Contrast  Result Date: 03/30/2018 CLINICAL DATA:  42 year old male with history of MSSA bacteremia with known endocarditis and epidural abscess extending from C2 through the sacrum.  Recent episode of hypotension and acute lower extremity weakness. Evaluate abscess and possible spinal cord infarct. EXAM: MRI CERVICAL, THORACIC SPINE WITHOUT CONTRAST TECHNIQUE: Multiplanar and multiecho pulse sequences of the cervical spine, to include the craniocervical junction and cervicothoracic junction, and thoracic and lumbar spine, were obtained without intravenous contrast. COMPARISON:  Prior MRI from 03/22/2018. FINDINGS: MRI CERVICAL SPINE FINDINGS Alignment: Examination technically limited as the patient was unable to tolerate the full length of the exam. Additionally, images provided are degraded by motion artifact. Reversal of the normal cervical lordosis with apex at C5, stable. No interval listhesis or malalignment. Vertebrae: Vertebral body height maintained without acute or interval fracture. Bone marrow signal intensity diffusely decreased on T1 weighted imaging, suspected to be related to anemia and chronic disease. No discrete osseous lesions. No abnormal marrow edema. No evidence for interval discitis. Cord: Signal intensity within the cervical spinal cord is within normal limits. No findings to suggest interval cord infarction on this motion degraded and limited exam. Previously identified epidural collection involving the ventral epidural space extending from C2 inferiorly is decreased in size, now measuring up to 7 mm in maximal AP diameter at the level of C2. Collection diffusely involves the ventral epidural space, but also is seen dorsally as well. Slightly improved diffuse spinal stenosis with thecal sac patency. Posterior Fossa, vertebral arteries, paraspinal tissues: Visualized brain and posterior fossa within normal limits. Craniocervical junction normal. Scattered edema within the posterior paraspinous soft tissues. Normal intravascular flow voids seen within the vertebral arteries bilaterally. Disc levels: C2-C3: Unremarkable. C3-C4: Small left foraminal protrusion with  associated moderate left C4 foraminal stenosis. C4-C5:  Unremarkable. C5-C6: Left eccentric disc bulge with uncovertebral hypertrophy. Moderate left C6 foraminal narrowing. C6-C7: Right foraminal disc protrusion with associated moderate right C7 foraminal stenosis. C7-T1:  Unremarkable. MRI THORACIC SPINE FINDINGS Alignment: Examination markedly limited as the patient was unable to tolerate the full length of the exam. Sagittal T1, T2, and STIR sequences only were performed. No axial images obtained. Vertebral bodies normally aligned with preservation of the normal thoracic kyphosis. Vertebrae: Vertebral body height maintained without evidence for interval fracture. Butterfly vertebra noted at T12. Diffusely decreased T1 weighted signal intensity throughout the visualized bone marrow, like related to anemia chronic disease. No findings to suggest interval or new discitis or septic arthritis. Cord: Signal intensity within the thoracic spinal cord grossly within normal limits on these limited sagittal views. No definite cord signal abnormality or cord edema to suggest acute spinal cord infarction. Previously seen diffuse epidural collection appears overall decreased in size from previous, with improved spinal stenosis and thecal sac patency. Paraspinal and other soft tissues: Scattered edema seen within the posterior paraspinous soft tissues at the upper back/cervicothoracic junction. No discrete soft tissue collections. Disc levels: T5-6: Central disc protrusion indenting upon the ventral thoracic spinal cord, stable. IMPRESSION: 1. Technically limited exam due to motion artifact and the patient's inability to tolerate the full length of the exam. Cervical and thoracic spine only was imaged, and only sagittal sequences of the thoracic spine were obtained. 2. No imaging findings to  suggest spinal cord infarction identified on this limited exam. 3. Interval improvement in diffuse epidural collection, decreased in size  as compared to previous exam with improved diffuse spinal stenosis and thecal sac patency. No new discitis or facet arthritis. Electronically Signed   By: Rise Mu M.D.   On: 03/30/2018 22:52   Mr Thoracic Spine Wo Contrast  Result Date: 03/30/2018 CLINICAL DATA:  42 year old male with history of MSSA bacteremia with known endocarditis and epidural abscess extending from C2 through the sacrum. Recent episode of hypotension and acute lower extremity weakness. Evaluate abscess and possible spinal cord infarct. EXAM: MRI CERVICAL, THORACIC SPINE WITHOUT CONTRAST TECHNIQUE: Multiplanar and multiecho pulse sequences of the cervical spine, to include the craniocervical junction and cervicothoracic junction, and thoracic and lumbar spine, were obtained without intravenous contrast. COMPARISON:  Prior MRI from 03/22/2018. FINDINGS: MRI CERVICAL SPINE FINDINGS Alignment: Examination technically limited as the patient was unable to tolerate the full length of the exam. Additionally, images provided are degraded by motion artifact. Reversal of the normal cervical lordosis with apex at C5, stable. No interval listhesis or malalignment. Vertebrae: Vertebral body height maintained without acute or interval fracture. Bone marrow signal intensity diffusely decreased on T1 weighted imaging, suspected to be related to anemia and chronic disease. No discrete osseous lesions. No abnormal marrow edema. No evidence for interval discitis. Cord: Signal intensity within the cervical spinal cord is within normal limits. No findings to suggest interval cord infarction on this motion degraded and limited exam. Previously identified epidural collection involving the ventral epidural space extending from C2 inferiorly is decreased in size, now measuring up to 7 mm in maximal AP diameter at the level of C2. Collection diffusely involves the ventral epidural space, but also is seen dorsally as well. Slightly improved diffuse spinal  stenosis with thecal sac patency. Posterior Fossa, vertebral arteries, paraspinal tissues: Visualized brain and posterior fossa within normal limits. Craniocervical junction normal. Scattered edema within the posterior paraspinous soft tissues. Normal intravascular flow voids seen within the vertebral arteries bilaterally. Disc levels: C2-C3: Unremarkable. C3-C4: Small left foraminal protrusion with associated moderate left C4 foraminal stenosis. C4-C5:  Unremarkable. C5-C6: Left eccentric disc bulge with uncovertebral hypertrophy. Moderate left C6 foraminal narrowing. C6-C7: Right foraminal disc protrusion with associated moderate right C7 foraminal stenosis. C7-T1:  Unremarkable. MRI THORACIC SPINE FINDINGS Alignment: Examination markedly limited as the patient was unable to tolerate the full length of the exam. Sagittal T1, T2, and STIR sequences only were performed. No axial images obtained. Vertebral bodies normally aligned with preservation of the normal thoracic kyphosis. Vertebrae: Vertebral body height maintained without evidence for interval fracture. Butterfly vertebra noted at T12. Diffusely decreased T1 weighted signal intensity throughout the visualized bone marrow, like related to anemia chronic disease. No findings to suggest interval or new discitis or septic arthritis. Cord: Signal intensity within the thoracic spinal cord grossly within normal limits on these limited sagittal views. No definite cord signal abnormality or cord edema to suggest acute spinal cord infarction. Previously seen diffuse epidural collection appears overall decreased in size from previous, with improved spinal stenosis and thecal sac patency. Paraspinal and other soft tissues: Scattered edema seen within the posterior paraspinous soft tissues at the upper back/cervicothoracic junction. No discrete soft tissue collections. Disc levels: T5-6: Central disc protrusion indenting upon the ventral thoracic spinal cord, stable.  IMPRESSION: 1. Technically limited exam due to motion artifact and the patient's inability to tolerate the full length of the exam. Cervical and thoracic spine only  was imaged, and only sagittal sequences of the thoracic spine were obtained. 2. No imaging findings to suggest spinal cord infarction identified on this limited exam. 3. Interval improvement in diffuse epidural collection, decreased in size as compared to previous exam with improved diffuse spinal stenosis and thecal sac patency. No new discitis or facet arthritis. Electronically Signed   By: Rise Mu M.D.   On: 03/30/2018 22:52   Mr Cervical Spine W Wo Contrast  Result Date: 03/22/2018 CLINICAL DATA:  Spine infection. EXAM: MRI TOTAL SPINE WITHOUT AND WITH CONTRAST TECHNIQUE: Multisequence MR imaging of the spine from the cervical spine to the sacrum was performed prior to and following IV contrast administration. CONTRAST:  6 cc Gadavist intravenous COMPARISON:  CT of the thoracic and lumbar spine from 2 days ago FINDINGS: MRI CERVICAL SPINE FINDINGS Alignment: Normal Vertebrae: No evidence of osseous infection. Canal/Cord: There is extensive spinal fluid collection preferentially in the ventral but also in the right more than left dorsal canal. Subarachnoid space is diffusely effaced. Maximal thickness is posterior to C2 at 9 mm. No cord signal abnormality. Posterior Fossa, vertebral arteries, paraspinal tissues: No retropharyngeal or other discrete soft tissue collection. Disc levels: C2-3: Unremarkable. C3-4: Small left foraminal protrusion with moderate narrowing C4-5: Unremarkable. C5-6: Disc narrowing and bulging with asymmetric left uncovertebral spurring. Left foraminal impingement C6-7: Right foraminal protrusion mild narrowing. C7-T1:Unremarkable. MRI THORACIC SPINE FINDINGS Alignment:  Normal Vertebrae: No evidence of osteomyelitis or discitis. T12 butterfly vertebra. Canal/Cord: Cervical ventral epidural collection continues  throughout the thoracic levels. There is also a focal dorsal component at T3-4 to T5-6, where thecal sac effacement is accentuated and there is cord flattening. No cord edema. Paraspinal and other soft tissues: Paraspinous phlegmon on the left at T8-T12, with new complex left pleural effusion and lower lobe atelectasis Disc levels: T5-6 central disc protrusion. MRI LUMBAR SPINE FINDINGS Segmentation:  5 lumbar type vertebral bodies Alignment:  Grade 1 anterolisthesis at L5-S1. Vertebrae: Marrow edema and heterogeneous enhancement within the L2, L3, and L4 spinous processes. No discitis or facet edema. Conus medullaris: Extends to the L1 level and is non edematous. There is extensive epidural collection completely effacing the thecal sac throughout the lumbar spine until L4-5 and below where the collection becomes ventral and right eccentric. The infection communicates with extensive bilateral abscess within the intrinsic back muscles via the interspinous space at L3-4. Patient had recent subcutaneous collection and left buttocks collection drainage, with packing seen in place. This midline, upper subcutaneous collection communicates with the paravertebral abscess along its superior margin based on postcontrast axial images. Paraspinal and other soft tissues: As above.  Distended bladder Disc levels: Chronic bilateral pars defects at L5. Critical Value/emergent results were called by telephone at the time of interpretation on 03/22/2018 at 3:04 pm to Dr. Thedore Mins , who verbally acknowledged these results. IMPRESSION: 1. Epidural abscess from C2 to sacrum as described. Maximal cord compression from T3-4 to T5-6. The thecal sac is completely effaced from L1 to L4-5. At the L3-4 interspinous space the spinal abscess communicates with large bilateral abscesses within the intrinsic back muscles. There is osteomyelitis of the L2, L3, and L4 spinous processes. No discitis or facet arthritis. 2. Left paravertebral abscess along  the lower thoracic spine with small left empyema that is new from CT 2 days ago. 3. Distended bladder Electronically Signed   By: Marnee Spring M.D.   On: 03/22/2018 15:11   Mr Thoracic Spine W Wo Contrast  Result Date: 03/22/2018  CLINICAL DATA:  Spine infection. EXAM: MRI TOTAL SPINE WITHOUT AND WITH CONTRAST TECHNIQUE: Multisequence MR imaging of the spine from the cervical spine to the sacrum was performed prior to and following IV contrast administration. CONTRAST:  6 cc Gadavist intravenous COMPARISON:  CT of the thoracic and lumbar spine from 2 days ago FINDINGS: MRI CERVICAL SPINE FINDINGS Alignment: Normal Vertebrae: No evidence of osseous infection. Canal/Cord: There is extensive spinal fluid collection preferentially in the ventral but also in the right more than left dorsal canal. Subarachnoid space is diffusely effaced. Maximal thickness is posterior to C2 at 9 mm. No cord signal abnormality. Posterior Fossa, vertebral arteries, paraspinal tissues: No retropharyngeal or other discrete soft tissue collection. Disc levels: C2-3: Unremarkable. C3-4: Small left foraminal protrusion with moderate narrowing C4-5: Unremarkable. C5-6: Disc narrowing and bulging with asymmetric left uncovertebral spurring. Left foraminal impingement C6-7: Right foraminal protrusion mild narrowing. C7-T1:Unremarkable. MRI THORACIC SPINE FINDINGS Alignment:  Normal Vertebrae: No evidence of osteomyelitis or discitis. T12 butterfly vertebra. Canal/Cord: Cervical ventral epidural collection continues throughout the thoracic levels. There is also a focal dorsal component at T3-4 to T5-6, where thecal sac effacement is accentuated and there is cord flattening. No cord edema. Paraspinal and other soft tissues: Paraspinous phlegmon on the left at T8-T12, with new complex left pleural effusion and lower lobe atelectasis Disc levels: T5-6 central disc protrusion. MRI LUMBAR SPINE FINDINGS Segmentation:  5 lumbar type vertebral bodies  Alignment:  Grade 1 anterolisthesis at L5-S1. Vertebrae: Marrow edema and heterogeneous enhancement within the L2, L3, and L4 spinous processes. No discitis or facet edema. Conus medullaris: Extends to the L1 level and is non edematous. There is extensive epidural collection completely effacing the thecal sac throughout the lumbar spine until L4-5 and below where the collection becomes ventral and right eccentric. The infection communicates with extensive bilateral abscess within the intrinsic back muscles via the interspinous space at L3-4. Patient had recent subcutaneous collection and left buttocks collection drainage, with packing seen in place. This midline, upper subcutaneous collection communicates with the paravertebral abscess along its superior margin based on postcontrast axial images. Paraspinal and other soft tissues: As above.  Distended bladder Disc levels: Chronic bilateral pars defects at L5. Critical Value/emergent results were called by telephone at the time of interpretation on 03/22/2018 at 3:04 pm to Dr. Thedore Mins , who verbally acknowledged these results. IMPRESSION: 1. Epidural abscess from C2 to sacrum as described. Maximal cord compression from T3-4 to T5-6. The thecal sac is completely effaced from L1 to L4-5. At the L3-4 interspinous space the spinal abscess communicates with large bilateral abscesses within the intrinsic back muscles. There is osteomyelitis of the L2, L3, and L4 spinous processes. No discitis or facet arthritis. 2. Left paravertebral abscess along the lower thoracic spine with small left empyema that is new from CT 2 days ago. 3. Distended bladder Electronically Signed   By: Marnee Spring M.D.   On: 03/22/2018 15:11   Mr Lumbar Spine W Wo Contrast  Result Date: 03/22/2018 CLINICAL DATA:  Spine infection. EXAM: MRI TOTAL SPINE WITHOUT AND WITH CONTRAST TECHNIQUE: Multisequence MR imaging of the spine from the cervical spine to the sacrum was performed prior to and  following IV contrast administration. CONTRAST:  6 cc Gadavist intravenous COMPARISON:  CT of the thoracic and lumbar spine from 2 days ago FINDINGS: MRI CERVICAL SPINE FINDINGS Alignment: Normal Vertebrae: No evidence of osseous infection. Canal/Cord: There is extensive spinal fluid collection preferentially in the ventral but also in  the right more than left dorsal canal. Subarachnoid space is diffusely effaced. Maximal thickness is posterior to C2 at 9 mm. No cord signal abnormality. Posterior Fossa, vertebral arteries, paraspinal tissues: No retropharyngeal or other discrete soft tissue collection. Disc levels: C2-3: Unremarkable. C3-4: Small left foraminal protrusion with moderate narrowing C4-5: Unremarkable. C5-6: Disc narrowing and bulging with asymmetric left uncovertebral spurring. Left foraminal impingement C6-7: Right foraminal protrusion mild narrowing. C7-T1:Unremarkable. MRI THORACIC SPINE FINDINGS Alignment:  Normal Vertebrae: No evidence of osteomyelitis or discitis. T12 butterfly vertebra. Canal/Cord: Cervical ventral epidural collection continues throughout the thoracic levels. There is also a focal dorsal component at T3-4 to T5-6, where thecal sac effacement is accentuated and there is cord flattening. No cord edema. Paraspinal and other soft tissues: Paraspinous phlegmon on the left at T8-T12, with new complex left pleural effusion and lower lobe atelectasis Disc levels: T5-6 central disc protrusion. MRI LUMBAR SPINE FINDINGS Segmentation:  5 lumbar type vertebral bodies Alignment:  Grade 1 anterolisthesis at L5-S1. Vertebrae: Marrow edema and heterogeneous enhancement within the L2, L3, and L4 spinous processes. No discitis or facet edema. Conus medullaris: Extends to the L1 level and is non edematous. There is extensive epidural collection completely effacing the thecal sac throughout the lumbar spine until L4-5 and below where the collection becomes ventral and right eccentric. The infection  communicates with extensive bilateral abscess within the intrinsic back muscles via the interspinous space at L3-4. Patient had recent subcutaneous collection and left buttocks collection drainage, with packing seen in place. This midline, upper subcutaneous collection communicates with the paravertebral abscess along its superior margin based on postcontrast axial images. Paraspinal and other soft tissues: As above.  Distended bladder Disc levels: Chronic bilateral pars defects at L5. Critical Value/emergent results were called by telephone at the time of interpretation on 03/22/2018 at 3:04 pm to Dr. Thedore Mins , who verbally acknowledged these results. IMPRESSION: 1. Epidural abscess from C2 to sacrum as described. Maximal cord compression from T3-4 to T5-6. The thecal sac is completely effaced from L1 to L4-5. At the L3-4 interspinous space the spinal abscess communicates with large bilateral abscesses within the intrinsic back muscles. There is osteomyelitis of the L2, L3, and L4 spinous processes. No discitis or facet arthritis. 2. Left paravertebral abscess along the lower thoracic spine with small left empyema that is new from CT 2 days ago. 3. Distended bladder Electronically Signed   By: Marnee Spring M.D.   On: 03/22/2018 15:11   Dg Chest Port 1 View  Result Date: 03/30/2018 CLINICAL DATA:  Shortness of breath EXAM: PORTABLE CHEST 1 VIEW COMPARISON:  03/30/2018 at 0227 hours FINDINGS: Moderate layering left pleural effusion, increased. Left lower lobe opacity, atelectasis versus pneumonia. Right lung is clear.  No pneumothorax. The heart is normal in size. IMPRESSION: Moderate layering left pleural effusion, increased. Left lower lobe opacity, atelectasis versus pneumonia. Electronically Signed   By: Charline Bills M.D.   On: 03/30/2018 19:06   Dg Chest Port 1 View  Result Date: 03/30/2018 CLINICAL DATA:  Chest pain EXAM: PORTABLE CHEST 1 VIEW COMPARISON:  None. FINDINGS: Retrocardiac opacity,  atelectasis versus pneumonia. Possible small left pleural effusion. Right lung is clear. No pneumothorax. The heart is normal in size. IMPRESSION: Retrocardiac opacity, atelectasis versus pneumonia. Possible small left pleural effusion. Electronically Signed   By: Charline Bills M.D.   On: 03/30/2018 02:49   [place] Ekg Site Rite  Result Date: 04/04/2018 If Site Rite image not attached, placement could not be confirmed  due to current cardiac rhythm.  [place] Ekg Site Rite  Result Date: 03/21/2018 If Site Rite image not attached, placement could not be confirmed due to current cardiac rhythm.    LOS: 18 days   Signature  Shon Hale M.D on 04/07/2018 at 4:58 PM  To page go to [email or web address] - password [facility]

## 2018-04-08 LAB — TYPE AND SCREEN
ABO/RH(D): O POS
Antibody Screen: NEGATIVE
UNIT DIVISION: 0

## 2018-04-08 LAB — GLUCOSE, CAPILLARY
GLUCOSE-CAPILLARY: 312 mg/dL — AB (ref 70–99)
Glucose-Capillary: 153 mg/dL — ABNORMAL HIGH (ref 70–99)
Glucose-Capillary: 186 mg/dL — ABNORMAL HIGH (ref 70–99)
Glucose-Capillary: 373 mg/dL — ABNORMAL HIGH (ref 70–99)

## 2018-04-08 LAB — BPAM RBC
BLOOD PRODUCT EXPIRATION DATE: 201911052359
ISSUE DATE / TIME: 201910081428
UNIT TYPE AND RH: 5100

## 2018-04-08 MED ORDER — TAMSULOSIN HCL 0.4 MG PO CAPS
0.4000 mg | ORAL_CAPSULE | Freq: Two times a day (BID) | ORAL | Status: DC
  Administered 2018-04-08 – 2018-04-16 (×16): 0.4 mg via ORAL
  Filled 2018-04-08 (×16): qty 1

## 2018-04-08 NOTE — Progress Notes (Addendum)
Subjective: Patient reports "I'm doing ok...still here"  Objective: Vital signs in last 24 hours: Temp:  [98.9 F (37.2 C)-99.4 F (37.4 C)] 98.9 F (37.2 C) (10/10 0555) Pulse Rate:  [82-96] 82 (10/10 0555) Resp:  [18] 18 (10/10 0555) BP: (111-125)/(71-75) 111/71 (10/10 0555) SpO2:  [93 %-94 %] 93 % (10/10 0555)  Intake/Output from previous day: 10/09 0701 - 10/10 0700 In: 1080 [P.O.:1080] Out: 2900 [Urine:2900] Intake/Output this shift: No intake/output data recorded.  Alert and conversant without c/o pain or discomfort. MAEW. Good strength all extremities, including hand intrinsics. Lumbar wounds managed by wound therapy.  Lab Results: Recent Labs    04/06/18 0355 04/07/18 0357  WBC 10.6* 9.1  HGB 7.2* 8.1*  HCT 23.8* 27.2*  PLT 328 382   BMET Recent Labs    04/06/18 0355 04/07/18 0357  NA 136 137  K 3.9 3.6  CL 96* 97*  CO2 34* 31  GLUCOSE 210* 289*  BUN 6 8  CREATININE 0.61 0.70  CALCIUM 7.5* 7.8*    Studies/Results: No results found.  Assessment/Plan:   LOS: 19 days  supportive care continues   Georgiann Cocker 04/08/2018, 7:44 AM   [name] continuing to improve

## 2018-04-08 NOTE — Progress Notes (Signed)
PROGRESS NOTE        PATIENT DETAILS  Name: Tony Long  Age: 42 y.o.  Sex: male Date of Birth: 1975-12-14 Admit Date: 03/20/2018 Admitting Physician Kendell Bane, MD  WUJ:[id number], Rolm Gala, FNP   Brief Narrative:  [name] a 42 y.o. male history of IVDA, DM-2, hypertension admitted for worsening back pain-further evaluation revealed MSSA bacteremia with tricuspid valve endocarditis, epidural abscess involving C2 through sacrum, and numerous soft tissue back abscesses.  Evaluated by general surgery-underwent I&D of her lower back soft tissue abscess, evaluated by neurosurgery-not felt to be a candidate for decompressive surgery due to extensive nature of the disease and lack of any significant neurological findings.  ID following He was initially kept on IV cefazolin and then antibiotic coverage was broadened to Vancomycin and Rocephin on 03/30/2018 by ID.   Currently on IV nafcillin on 04/16/2018, thereafter 4 weeks of Keflex 500 p.o. twice daily per ID.Marland Kitchen   See below for further details   Subjective:  No fevers or chills no vomiting or diarrhea  Assessment/Plan:   MSSA Bacteremia with tricuspid valve endocarditis, extensive epidural abscess (C2 through sacrum) and large soft tissue lower back abscess  : due to extensive nature of the epidural abscess and lack of concerning neurological findings-neurosurgery does not recommend decompressive surgery. Seen by ID and current recommendation noted, he was initially kept on IV cefazolin and then antibiotic coverage was broadened to Vancomycin and Rocephin on 03/30/2018 by ID.   Currently on IV nafcillin on 04/16/2018, thereafter 4 weeks of Keflex 500 p.o. twice daily per ID.Marland Kitchen   Given history of IVDA-not a candidate for outpatient IV Abx therapy. He has received a PICC line, continue IV antibiotics through PICC Line.  Patient doing fairly well, hospital course was complicated by an episode of sepsis which arose on  03/30/2018, at that time he was frankly septic and had developed some transient left lower extremity weakness as well of unclear etiology, at that point he had to be briefly moved to ICU, at that point antibiotics were broadened and he is now stable.    Patient aware of the extensive nature of this infection-with life-threatening and life disabling risks including quadriplegia along with life-threatening sepsis with this present infection which is nonsurgical.     Cocaine/Substance abuse: Withdrawal symptoms much better-on 9/25 patient developed excessive sedation-Klonopin/Flexeril has been changed to as needed dosing-clonidine being tapered down.  On 03/28/2018 he requested me that he be put back on his Suboxone, he does not want short-acting pain medications anymore,    Polysubstance abuse/IVDA: Counseled to quit.  Anemia.  Normocytic, he had some chronic anemia upon admission as well and now worse due to him dilution from IV fluids also some element of iron deficiency per anemia panel likely due to multiple blood draws.  No signs of ongoing bleeding or acute blood loss, s/p  1 unit of packed RBC on 04/06/2018 and monitor, hgb is 9.1   Mild lower extremity leg edema.  ---improving with TED stockings and gentle Lasix on 04/03/2018 & post RBC on 04/06/2018 and monitor.    New onset of left-sided pleuritic chest pain starting on late night 03/29/2018.  Nonspecific EKG changes stable on combination of aspirin, beta-blocker and statin.  Echocardiogram shows no wall motion abnormality and preserved EF, pain is completely resolved.  Hypertension: Monitor on beta-blocker.  Acute urinary retention: Continue Foley and Flomax, trial of Foley removal failed on 04/03/2018, likely combination of lack of activity and spine infection.  Severe hypokalemia and hypomagnesemia. -Replace and recheck  DM-2: Poor outpatient control, last A1c 12.6, continue Lantus and sliding scale.  Lantus dose adjusted for better  control.   CBG (last 3)  Recent Labs    04/07/18 2139 04/08/18 0801 04/08/18 1210  GLUCAP 201* 153* 186*   Lab Results  Component Value Date   HGBA1C 12.6 (A) 02/26/2018     DVT Prophylaxis: Prophylactic heparin  Code Status: Full code  Family Communication: Parents multiple times clearly explained prognosis remains poor, now DNR.  They have clear understanding of his medical situation.  Disposition Plan: Home after completing IV antibiotics   Antimicrobial agents: Anti-infectives (From admission, onward)   Start     Dose/Rate Route Frequency Ordered Stop   04/01/18 1300  nafcillin 2 g in sodium chloride 0.9 % 100 mL IVPB     2 g 200 mL/hr over 30 Minutes Intravenous Every 4 hours 04/01/18 1242     04/01/18 1200  nafcillin injection 2 g  Status:  Discontinued     2 g Intravenous Every 4 hours 04/01/18 1129 04/01/18 1242   03/31/18 0200  vancomycin (VANCOCIN) IVPB 750 mg/150 ml premix  Status:  Discontinued     750 mg 150 mL/hr over 60 Minutes Intravenous Every 8 hours 03/30/18 1628 04/01/18 1129   03/31/18 0100  meropenem (MERREM) 1 g in sodium chloride 0.9 % 100 mL IVPB  Status:  Discontinued     1 g 200 mL/hr over 30 Minutes Intravenous Every 8 hours 03/30/18 1628 04/01/18 1129   03/30/18 1630  vancomycin (VANCOCIN) 1,500 mg in sodium chloride 0.9 % 500 mL IVPB     1,500 mg 250 mL/hr over 120 Minutes Intravenous NOW 03/30/18 1619 03/30/18 1925   03/30/18 1630  meropenem (MERREM) 2 g in sodium chloride 0.9 % 100 mL IVPB     2 g 200 mL/hr over 30 Minutes Intravenous NOW 03/30/18 1619 03/30/18 1754   03/21/18 0930  ceFAZolin (ANCEF) IVPB 2g/100 mL premix  Status:  Discontinued     2 g 200 mL/hr over 30 Minutes Intravenous Every 8 hours 03/21/18 0920 03/30/18 1616      Procedures:   MRI C, T and L-spine done 03/30/2018 and 03/31/2018.    CT angiogram chest 03/30/2018.  1. No pulmonary embolus. 2. Moderate loculated left pleural effusion. Adjacent airspace  disease likely compressive atelectasis, pneumonia not excluded in the setting of spinal infection and endocarditis. Mild right pleural thickening or loculated pleural fluid in the upper right hemithorax.  Repeat echocardiogram 03/30/2018 - Left ventricle: The cavity size was normal. Wall thickness was normal. Systolic function was normal. The estimated ejection fraction was in the range of 60% to 65%. Wall motion was normal; there were no regional wall motion abnormalities. Left ventricular diastolic function parameters were normal.  TEE - - Left ventricle: The cavity size was normal. Wall thickness wasnormal. Systolic function was normal. The estimated ejectionfraction was in the range of 60% to 65%. - Aortic valve: No evidence of vegetation. - Mitral valve: No evidence of vegetation. - Left atrium: No evidence of thrombus in the atrial cavity or appendage. No evidence of thrombus in the appendage. - Right atrium: No evidence of thrombus in the atrial cavity orappendage. - Tricuspid valve: There was a vegetation. There was a small (1.2cmx .6cm), mobile vegetation on the septal leaflet.  There was mildregurgitation. - Pulmonic valve: No evidence of vegetation.  Impressions:   Tricuspid valve endocarditis. Discussed with primary team   MRI C-T-L Spine - 1. Epidural abscess from C2 to sacrum as described. Maximal cord compression from T3-4 to T5-6. The thecal sac is completely effaced from L1 to L4-5. At the L3-4 interspinous space the spinal abscess communicates with large bilateral abscesses within the intrinsic back muscles. There is osteomyelitis of the L2, L3, and L4 spinous processes. No discitis or facet arthritis. 2. Left paravertebral abscess along the lower thoracic spine with small left empyema that is new from CT 2 days ago. 3. Distended bladder  CT - 1. Evidence of cellulitis involving the right side of the base of the neck extending into the right supraclavicular region with  fluid and gas in the soft tissues at the base of the right side of the neck. 2. No significant abnormality of the thoracic spine. 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  CT -  1. Extensive abnormal fluid collection in the subcutaneous fat of the midline of the back with underlying marked abnormality of the posterior paraspinal musculature from L1-2 through S3. This is worrisome for subcutaneous abscess and myositis. 2. Moth-eaten appearance of the spinous processes of L3 and L4 consistent with osteomyelitis. 3. No discrete epidural abscess. However, the abnormal edema in the paraspinal musculature extends to the posterior aspect of the spinal canal at L3-4. 4. MRI with and without contrast may better define the extent of the soft tissue and infection and could detect epidural extension that is not apparent on this unenhanced CT scan.  9/22>> PICC line  I&D by [facility] of back soft tissue abscess on -  03/22/18  CONSULTS:  N.Surgery, ID, general surgery, PCCM    MEDICATIONS:  Scheduled Meds:  . aspirin  81 mg Oral Daily  . atorvastatin  40 mg Oral q1800  . buprenorphine-naloxone  2 tablet Sublingual Daily  . feeding supplement (PRO-STAT SUGAR FREE 64)  30 mL Oral TID WC  . gabapentin  300 mg Oral TID  . heparin injection (subcutaneous)  5,000 Units Subcutaneous Q8H  . insulin aspart  0-5 Units Subcutaneous QHS  . insulin aspart  0-9 Units Subcutaneous TID WC  . insulin aspart  4 Units Subcutaneous TID WC  . insulin glargine  25 Units Subcutaneous QHS  . metoprolol tartrate  50 mg Oral BID  . nicotine  21 mg Transdermal Daily  . polyethylene glycol  17 g Oral BID  . senna-docusate  2 tablet Oral QHS  . tamsulosin  0.4 mg Oral Daily   Continuous Infusions: . nafcillin IV 2 g (04/08/18 1356)   PRN Meds:.acetaminophen, bisacodyl, clonazepam, cyclobenzaprine, hydrALAZINE, LORazepam, magnesium hydroxide, metoprolol tartrate, nitroGLYCERIN, sodium chloride flush, sodium phosphate   PHYSICAL  EXAM: Vital signs: Vitals:   04/07/18 2141 04/08/18 0555 04/08/18 0840 04/08/18 1334  BP: 125/75 111/71 125/80 119/80  Pulse: 96 82 91 88  Resp: 18 18  18   Temp: 99.4 F (37.4 C) 98.9 F (37.2 C)  99.4 F (37.4 C)  TempSrc: Oral Oral  Oral  SpO2: 94% 93%  95%  Weight:      Height:       Filed Weights   03/21/18 0602  Weight: 68 kg   Body mass index is 20.92 kg/m.   Exam  Awake Alert, Oriented X 3, No new F.N deficits, Normal affect [place].AT,PERRAL Supple Neck,No JVD,   Lungs- CTA, fair air movement bilaterally CV-RRR, S1-S2 normal  Abd-+ve  B.Sounds, Abd Soft, No tenderness,   LE--- 2+ ankle edema bilaterally Neuro-L Leg 5/5    LABORATORY DATA: CBC: Recent Labs  Lab 04/02/18 0625 04/05/18 0754 04/05/18 1133 04/06/18 0355 04/07/18 0357  WBC 22.2* 10.2  --  10.6* 9.1  HGB 9.1* 6.7* 7.3* 7.2* 8.1*  HCT 28.7* 22.2* 23.3* 23.8* 27.2*  MCV 96.6 98.7  --  98.8 97.1  PLT 485* 306  --  328 382    Basic Metabolic Panel: Recent Labs  Lab 04/02/18 0625 04/04/18 0634 04/05/18 0754 04/06/18 0355 04/07/18 0357  NA 140 138 136 136 137  K 3.6 3.0* 2.4* 3.9 3.6  CL 97* 100 98 96* 97*  CO2 30 32 33* 34* 31  GLUCOSE 360* 258* 169* 210* 289*  BUN 15 10 <5* 6 8  CREATININE 0.76 0.66 0.49* 0.61 0.70  CALCIUM 8.4* 7.7* 6.6* 7.5* 7.8*  MG 2.0 1.6* 1.5* 1.9 1.7    GFR: Estimated Creatinine Clearance: 116.9 mL/min (by C-G formula based on SCr of 0.7 mg/dL).  Liver Function Tests: Recent Labs  Lab 04/02/18 0625  AST 22  ALT 14  ALKPHOS 140*  BILITOT 0.7  PROT 6.5  ALBUMIN 1.5*   No results for input(s): LIPASE, AMYLASE in the last 168 hours. No results for input(s): AMMONIA in the last 168 hours.  Coagulation Profile: No results for input(s): INR, PROTIME in the last 168 hours.  Cardiac Enzymes: No results for input(s): CKTOTAL, CKMB, CKMBINDEX, TROPONINI in the last 168 hours.  BNP (last 3 results) No results for input(s): PROBNP in the last 8760  hours.  HbA1C: No results for input(s): HGBA1C in the last 72 hours.  CBG: Recent Labs  Lab 04/07/18 1444 04/07/18 1711 04/07/18 2139 04/08/18 0801 04/08/18 1210  GLUCAP 219* 151* 201* 153* 186*    Lipid Profile: No results for input(s): CHOL, HDL, LDLCALC, TRIG, CHOLHDL, LDLDIRECT in the last 72 hours.  Thyroid Function Tests: No results for input(s): TSH, T4TOTAL, FREET4, T3FREE, THYROIDAB in the last 72 hours.  Anemia Panel: No results for input(s): VITAMINB12, FOLATE, FERRITIN, TIBC, IRON, RETICCTPCT in the last 72 hours.  Urine analysis:    Component Value Date/Time   COLORURINE YELLOW 03/30/2018 1930   APPEARANCEUR CLEAR 03/30/2018 1930   LABSPEC 1.009 03/30/2018 1930   PHURINE 6.0 03/30/2018 1930   GLUCOSEU 50 (A) 03/30/2018 1930   HGBUR SMALL (A) 03/30/2018 1930   BILIRUBINUR NEGATIVE 03/30/2018 1930   BILIRUBINUR small 02/26/2018 1202   KETONESUR NEGATIVE 03/30/2018 1930   PROTEINUR NEGATIVE 03/30/2018 1930   UROBILINOGEN 1.0 02/26/2018 1202   UROBILINOGEN 1.0 12/11/2016 1020   NITRITE NEGATIVE 03/30/2018 1930   LEUKOCYTESUR TRACE (A) 03/30/2018 1930    Sepsis Labs: Lactic Acid, Venous    Component Value Date/Time   LATICACIDVEN 1.2 03/30/2018 1837    MICROBIOLOGY: Recent Results (from the past 240 hour(s))  Culture, blood (single)     Status: None   Collection Time: 03/30/18  4:12 PM  Result Value Ref Range Status   Specimen Description BLOOD RIGHT ANTECUBITAL  Final   Special Requests   Final    BOTTLES DRAWN AEROBIC ONLY Blood Culture adequate volume   Culture   Final    NO GROWTH 5 DAYS Performed at [name], [address] [place], [place] [address]    Report Status 04/04/2018 FINAL  Final  Culture, Urine     Status: None   Collection Time: 03/30/18  4:20 PM  Result Value Ref Range  Status   Specimen Description URINE, CATHETERIZED  Final   Special Requests NONE  Final   Culture   Final    NO GROWTH Performed at [facility], [address] [place], [place] [address]    Report Status 04/01/2018 FINAL  Final  Culture, blood (Routine X 2) w Reflex to ID Panel     Status: None   Collection Time: 04/01/18  9:49 AM  Result Value Ref Range Status   Specimen Description BLOOD BLOOD RIGHT HAND  Final   Special Requests   Final    BOTTLES DRAWN AEROBIC ONLY Blood Culture results may not be optimal due to an inadequate volume of blood received in culture bottles   Culture   Final    NO GROWTH 5 DAYS Performed at [facility], [address] [place], [place] [address]    Report Status 04/06/2018 FINAL  Final  Culture, blood (Routine X 2) w Reflex to ID Panel     Status: None   Collection Time: 04/01/18 10:02 AM  Result Value Ref Range Status   Specimen Description BLOOD RIGHT ANTECUBITAL  Final   Special Requests   Final    BOTTLES DRAWN AEROBIC ONLY Blood Culture adequate volume   Culture   Final    NO GROWTH 5 DAYS Performed at [facility], [address], [place], [place] [address]    Report Status 04/06/2018 FINAL  Final    RADIOLOGY STUDIES/RESULTS:  Ct Angio Chest Pe W Or Wo Contrast  Result Date: 03/30/2018 CLINICAL DATA:  Shortness of breath PE suspected, high pretest prob. Patient with known epidural abscess and endocarditis. Now with left-sided chest pain. EXAM: CT ANGIOGRAPHY CHEST WITH CONTRAST TECHNIQUE: Multidetector CT imaging of the chest was performed using the standard protocol during bolus administration of intravenous contrast. Multiplanar CT image reconstructions and MIPs were obtained to evaluate the vascular anatomy. CONTRAST:  54mL ISOVUE-370 IOPAMIDOL (ISOVUE-370) INJECTION 76% COMPARISON:  Chest radiographs earlier this day. FINDINGS: Cardiovascular: There are no filling defects within the pulmonary arteries to suggest pulmonary embolus. The thoracic aorta is normal in caliber without dissection. Mild cardiomegaly. Small amount pericardial fluid. Mediastinum/Nodes:  Loculated left pleural effusion tracks along the left aspect of the mediastinum. Small left hilar nodes without bulky adenopathy. No enlarged mediastinal nodes. No dominant thyroid nodule. The esophagus is decompressed. Lungs/Pleura: Moderate left pleural effusion is loculated and tracks along the posterior, lateral, and medial hemithorax. Adjacent airspace disease in the left lower lobe, favoring compressive atelectasis, with adjacent compressive atelectasis in the right upper lobe. Pleural thickening versus loculated pleural effusion the upper medial right hemithorax, with additional areas of pleural thickening dependently. Dependent atelectasis in the right lower lobe. Trachea and bronchi are patent. Upper Abdomen: No acute findings. Musculoskeletal: Patient with known epidural abscess, not well delineated by CT. No bony destructive change. Review of the MIP images confirms the above findings. IMPRESSION: 1. No pulmonary embolus. 2. Moderate loculated left pleural effusion. Adjacent airspace disease likely compressive atelectasis, pneumonia not excluded in the setting of spinal infection and endocarditis. Mild right pleural thickening or loculated pleural fluid in the upper right hemithorax. Electronically Signed   By: Narda Rutherford M.D.   On: 03/30/2018 23:21   Ct Thoracic Spine Wo Contrast  Result Date: 03/20/2018 CLINICAL DATA:  Progressive back pain.  Swelling of the lower back. EXAM: CT THORACIC SPINE WITHOUT CONTRAST TECHNIQUE: Multidetector CT images of the thoracic were obtained using the standard protocol without  intravenous contrast. COMPARISON:  None. FINDINGS: Alignment: Normal. 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 Paraspinal and other soft tissues: There is abnormal gas and fluid in the soft tissues of the right side of the base of the neck and in the right supraclavicular region. Paraspinal soft tissues appear normal throughout the thoracic spine. Disc levels: There is no evidence  of disc protrusion or significant disc bulging or spinal or foraminal stenosis or other significant abnormality of the thoracic spine. IMPRESSION: 1. Evidence of cellulitis involving the right side of the base of the neck extending into the right supraclavicular region with fluid and gas in the soft tissues at the base of the right side of the neck. 2. No significant abnormality of the thoracic spine. 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 Electronically Signed   By: Francene Boyers M.D.   On: 03/20/2018 11:25   Ct Lumbar Spine Wo Contrast  Addendum Date: 03/20/2018   ADDENDUM REPORT: 03/20/2018 11:44 ADDENDUM: Critical Value/emergent results were called by telephone at the time of interpretation on 03/20/2018 at 11:30 am to Dr. Sharyn Creamer , who verbally acknowledged these results. Electronically Signed   By: Francene Boyers M.D.   On: 03/20/2018 11:44   Result Date: 03/20/2018 CLINICAL DATA:  Increasing low back pain and soft tissue swelling. EXAM: CT LUMBAR SPINE WITHOUT CONTRAST TECHNIQUE: Multidetector CT imaging of the lumbar spine was performed without intravenous contrast administration. Multiplanar CT image reconstructions were also generated. IV contrast could not be utilized due to the lack of an appropriate IV. COMPARISON:  None. FINDINGS: Segmentation: 5 lumbar type vertebrae. Alignment: Normal. Vertebrae: There is a moth-eaten appearance of the spinous processes of L3 and L4 which is worrisome for osteomyelitis. Bilateral pars defects at L5 with grade 1 spondylolisthesis. 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 Paraspinal and other soft tissues: There is an extensive abnormal fluid collection in the subcutaneous soft tissues of the posterior aspect of the back extending from approximately L1-2 to S3. This fluid collection is lobulated and measures approximately 20 x 9 x 2.5 cm. It is centered slightly to the left of midline and has a mass effect upon the adjacent posterior paraspinal muscles. There is  abnormal lucency in the underlying paraspinal muscles which could represent myositis. Disc levels: T11-12: No significant abnormality. Butterfly T12 vertebra. T12-L1: No significant abnormality. L1-2: Normal disc. Abnormal edema in the posterior paraspinal musculature with adjacent fluid collection in the subcutaneous fat of the posterior aspect of the back as described above. L2-3: Normal disc. L3-4: Normal disc. Lucency in the posterior paraspinal soft tissues extends to the posterior aspect of the thecal sac on image 80 of series 4 but there is no discrete epidural abscess. L4-5: Normal disc.  No evidence of epidural abscess. L5-S1: Grade 1 spondylolisthesis. No disc bulging or protrusion. Bilateral pars defects. No visible epidural abscess. IMPRESSION: 1. Extensive abnormal fluid collection in the subcutaneous fat of the midline of the back with underlying marked abnormality of the posterior paraspinal musculature from L1-2 through S3. This is worrisome for subcutaneous abscess and myositis. 2. Moth-eaten appearance of the spinous processes of L3 and L4 consistent with osteomyelitis. 3. No discrete epidural abscess. However, the abnormal edema in the paraspinal musculature extends to the posterior aspect of the spinal canal at L3-4. 4. MRI with and without contrast may better define the extent of the soft tissue and infection and could detect epidural extension that is not apparent on this unenhanced CT scan. Electronically Signed: By: Francene Boyers M.D. On:  03/20/2018 11:18   Mr Cervical Spine Wo Contrast  Result Date: 03/30/2018 CLINICAL DATA:  42 year old male with history of MSSA bacteremia with known endocarditis and epidural abscess extending from C2 through the sacrum. Recent episode of hypotension and acute lower extremity weakness. Evaluate abscess and possible spinal cord infarct. EXAM: MRI CERVICAL, THORACIC SPINE WITHOUT CONTRAST TECHNIQUE: Multiplanar and multiecho pulse sequences of the  cervical spine, to include the craniocervical junction and cervicothoracic junction, and thoracic and lumbar spine, were obtained without intravenous contrast. COMPARISON:  Prior MRI from 03/22/2018. FINDINGS: MRI CERVICAL SPINE FINDINGS Alignment: Examination technically limited as the patient was unable to tolerate the full length of the exam. Additionally, images provided are degraded by motion artifact. Reversal of the normal cervical lordosis with apex at C5, stable. No interval listhesis or malalignment. Vertebrae: Vertebral body height maintained without acute or interval fracture. Bone marrow signal intensity diffusely decreased on T1 weighted imaging, suspected to be related to anemia and chronic disease. No discrete osseous lesions. No abnormal marrow edema. No evidence for interval discitis. Cord: Signal intensity within the cervical spinal cord is within normal limits. No findings to suggest interval cord infarction on this motion degraded and limited exam. Previously identified epidural collection involving the ventral epidural space extending from C2 inferiorly is decreased in size, now measuring up to 7 mm in maximal AP diameter at the level of C2. Collection diffusely involves the ventral epidural space, but also is seen dorsally as well. Slightly improved diffuse spinal stenosis with thecal sac patency. Posterior Fossa, vertebral arteries, paraspinal tissues: Visualized brain and posterior fossa within normal limits. Craniocervical junction normal. Scattered edema within the posterior paraspinous soft tissues. Normal intravascular flow voids seen within the vertebral arteries bilaterally. Disc levels: C2-C3: Unremarkable. C3-C4: Small left foraminal protrusion with associated moderate left C4 foraminal stenosis. C4-C5:  Unremarkable. C5-C6: Left eccentric disc bulge with uncovertebral hypertrophy. Moderate left C6 foraminal narrowing. C6-C7: Right foraminal disc protrusion with associated moderate  right C7 foraminal stenosis. C7-T1:  Unremarkable. MRI THORACIC SPINE FINDINGS Alignment: Examination markedly limited as the patient was unable to tolerate the full length of the exam. Sagittal T1, T2, and STIR sequences only were performed. No axial images obtained. Vertebral bodies normally aligned with preservation of the normal thoracic kyphosis. Vertebrae: Vertebral body height maintained without evidence for interval fracture. Butterfly vertebra noted at T12. Diffusely decreased T1 weighted signal intensity throughout the visualized bone marrow, like related to anemia chronic disease. No findings to suggest interval or new discitis or septic arthritis. Cord: Signal intensity within the thoracic spinal cord grossly within normal limits on these limited sagittal views. No definite cord signal abnormality or cord edema to suggest acute spinal cord infarction. Previously seen diffuse epidural collection appears overall decreased in size from previous, with improved spinal stenosis and thecal sac patency. Paraspinal and other soft tissues: Scattered edema seen within the posterior paraspinous soft tissues at the upper back/cervicothoracic junction. No discrete soft tissue collections. Disc levels: T5-6: Central disc protrusion indenting upon the ventral thoracic spinal cord, stable. IMPRESSION: 1. Technically limited exam due to motion artifact and the patient's inability to tolerate the full length of the exam. Cervical and thoracic spine only was imaged, and only sagittal sequences of the thoracic spine were obtained. 2. No imaging findings to suggest spinal cord infarction identified on this limited exam. 3. Interval improvement in diffuse epidural collection, decreased in size as compared to previous exam with improved diffuse spinal stenosis and thecal sac patency. No new  discitis or facet arthritis. Electronically Signed   By: Rise Mu M.D.   On: 03/30/2018 22:52   Mr Thoracic Spine Wo  Contrast  Result Date: 03/30/2018 CLINICAL DATA:  42 year old male with history of MSSA bacteremia with known endocarditis and epidural abscess extending from C2 through the sacrum. Recent episode of hypotension and acute lower extremity weakness. Evaluate abscess and possible spinal cord infarct. EXAM: MRI CERVICAL, THORACIC SPINE WITHOUT CONTRAST TECHNIQUE: Multiplanar and multiecho pulse sequences of the cervical spine, to include the craniocervical junction and cervicothoracic junction, and thoracic and lumbar spine, were obtained without intravenous contrast. COMPARISON:  Prior MRI from 03/22/2018. FINDINGS: MRI CERVICAL SPINE FINDINGS Alignment: Examination technically limited as the patient was unable to tolerate the full length of the exam. Additionally, images provided are degraded by motion artifact. Reversal of the normal cervical lordosis with apex at C5, stable. No interval listhesis or malalignment. Vertebrae: Vertebral body height maintained without acute or interval fracture. Bone marrow signal intensity diffusely decreased on T1 weighted imaging, suspected to be related to anemia and chronic disease. No discrete osseous lesions. No abnormal marrow edema. No evidence for interval discitis. Cord: Signal intensity within the cervical spinal cord is within normal limits. No findings to suggest interval cord infarction on this motion degraded and limited exam. Previously identified epidural collection involving the ventral epidural space extending from C2 inferiorly is decreased in size, now measuring up to 7 mm in maximal AP diameter at the level of C2. Collection diffusely involves the ventral epidural space, but also is seen dorsally as well. Slightly improved diffuse spinal stenosis with thecal sac patency. Posterior Fossa, vertebral arteries, paraspinal tissues: Visualized brain and posterior fossa within normal limits. Craniocervical junction normal. Scattered edema within the posterior  paraspinous soft tissues. Normal intravascular flow voids seen within the vertebral arteries bilaterally. Disc levels: C2-C3: Unremarkable. C3-C4: Small left foraminal protrusion with associated moderate left C4 foraminal stenosis. C4-C5:  Unremarkable. C5-C6: Left eccentric disc bulge with uncovertebral hypertrophy. Moderate left C6 foraminal narrowing. C6-C7: Right foraminal disc protrusion with associated moderate right C7 foraminal stenosis. C7-T1:  Unremarkable. MRI THORACIC SPINE FINDINGS Alignment: Examination markedly limited as the patient was unable to tolerate the full length of the exam. Sagittal T1, T2, and STIR sequences only were performed. No axial images obtained. Vertebral bodies normally aligned with preservation of the normal thoracic kyphosis. Vertebrae: Vertebral body height maintained without evidence for interval fracture. Butterfly vertebra noted at T12. Diffusely decreased T1 weighted signal intensity throughout the visualized bone marrow, like related to anemia chronic disease. No findings to suggest interval or new discitis or septic arthritis. Cord: Signal intensity within the thoracic spinal cord grossly within normal limits on these limited sagittal views. No definite cord signal abnormality or cord edema to suggest acute spinal cord infarction. Previously seen diffuse epidural collection appears overall decreased in size from previous, with improved spinal stenosis and thecal sac patency. Paraspinal and other soft tissues: Scattered edema seen within the posterior paraspinous soft tissues at the upper back/cervicothoracic junction. No discrete soft tissue collections. Disc levels: T5-6: Central disc protrusion indenting upon the ventral thoracic spinal cord, stable. IMPRESSION: 1. Technically limited exam due to motion artifact and the patient's inability to tolerate the full length of the exam. Cervical and thoracic spine only was imaged, and only sagittal sequences of the thoracic  spine were obtained. 2. No imaging findings to suggest spinal cord infarction identified on this limited exam. 3. Interval improvement in diffuse epidural collection, decreased in  size as compared to previous exam with improved diffuse spinal stenosis and thecal sac patency. No new discitis or facet arthritis. Electronically Signed   By: Rise Mu M.D.   On: 03/30/2018 22:52   Mr Cervical Spine W Wo Contrast  Result Date: 03/22/2018 CLINICAL DATA:  Spine infection. EXAM: MRI TOTAL SPINE WITHOUT AND WITH CONTRAST TECHNIQUE: Multisequence MR imaging of the spine from the cervical spine to the sacrum was performed prior to and following IV contrast administration. CONTRAST:  6 cc Gadavist intravenous COMPARISON:  CT of the thoracic and lumbar spine from 2 days ago FINDINGS: MRI CERVICAL SPINE FINDINGS Alignment: Normal Vertebrae: No evidence of osseous infection. Canal/Cord: There is extensive spinal fluid collection preferentially in the ventral but also in the right more than left dorsal canal. Subarachnoid space is diffusely effaced. Maximal thickness is posterior to C2 at 9 mm. No cord signal abnormality. Posterior Fossa, vertebral arteries, paraspinal tissues: No retropharyngeal or other discrete soft tissue collection. Disc levels: C2-3: Unremarkable. C3-4: Small left foraminal protrusion with moderate narrowing C4-5: Unremarkable. C5-6: Disc narrowing and bulging with asymmetric left uncovertebral spurring. Left foraminal impingement C6-7: Right foraminal protrusion mild narrowing. C7-T1:Unremarkable. MRI THORACIC SPINE FINDINGS Alignment:  Normal Vertebrae: No evidence of osteomyelitis or discitis. T12 butterfly vertebra. Canal/Cord: Cervical ventral epidural collection continues throughout the thoracic levels. There is also a focal dorsal component at T3-4 to T5-6, where thecal sac effacement is accentuated and there is cord flattening. No cord edema. Paraspinal and other soft tissues:  Paraspinous phlegmon on the left at T8-T12, with new complex left pleural effusion and lower lobe atelectasis Disc levels: T5-6 central disc protrusion. MRI LUMBAR SPINE FINDINGS Segmentation:  5 lumbar type vertebral bodies Alignment:  Grade 1 anterolisthesis at L5-S1. Vertebrae: Marrow edema and heterogeneous enhancement within the L2, L3, and L4 spinous processes. No discitis or facet edema. Conus medullaris: Extends to the L1 level and is non edematous. There is extensive epidural collection completely effacing the thecal sac throughout the lumbar spine until L4-5 and below where the collection becomes ventral and right eccentric. The infection communicates with extensive bilateral abscess within the intrinsic back muscles via the interspinous space at L3-4. Patient had recent subcutaneous collection and left buttocks collection drainage, with packing seen in place. This midline, upper subcutaneous collection communicates with the paravertebral abscess along its superior margin based on postcontrast axial images. Paraspinal and other soft tissues: As above.  Distended bladder Disc levels: Chronic bilateral pars defects at L5. Critical Value/emergent results were called by telephone at the time of interpretation on 03/22/2018 at 3:04 pm to Dr. Thedore Mins , who verbally acknowledged these results. IMPRESSION: 1. Epidural abscess from C2 to sacrum as described. Maximal cord compression from T3-4 to T5-6. The thecal sac is completely effaced from L1 to L4-5. At the L3-4 interspinous space the spinal abscess communicates with large bilateral abscesses within the intrinsic back muscles. There is osteomyelitis of the L2, L3, and L4 spinous processes. No discitis or facet arthritis. 2. Left paravertebral abscess along the lower thoracic spine with small left empyema that is new from CT 2 days ago. 3. Distended bladder Electronically Signed   By: Marnee Spring M.D.   On: 03/22/2018 15:11   Mr Thoracic Spine W Wo  Contrast  Result Date: 03/22/2018 CLINICAL DATA:  Spine infection. EXAM: MRI TOTAL SPINE WITHOUT AND WITH CONTRAST TECHNIQUE: Multisequence MR imaging of the spine from the cervical spine to the sacrum was performed prior to and following IV contrast  administration. CONTRAST:  6 cc Gadavist intravenous COMPARISON:  CT of the thoracic and lumbar spine from 2 days ago FINDINGS: MRI CERVICAL SPINE FINDINGS Alignment: Normal Vertebrae: No evidence of osseous infection. Canal/Cord: There is extensive spinal fluid collection preferentially in the ventral but also in the right more than left dorsal canal. Subarachnoid space is diffusely effaced. Maximal thickness is posterior to C2 at 9 mm. No cord signal abnormality. Posterior Fossa, vertebral arteries, paraspinal tissues: No retropharyngeal or other discrete soft tissue collection. Disc levels: C2-3: Unremarkable. C3-4: Small left foraminal protrusion with moderate narrowing C4-5: Unremarkable. C5-6: Disc narrowing and bulging with asymmetric left uncovertebral spurring. Left foraminal impingement C6-7: Right foraminal protrusion mild narrowing. C7-T1:Unremarkable. MRI THORACIC SPINE FINDINGS Alignment:  Normal Vertebrae: No evidence of osteomyelitis or discitis. T12 butterfly vertebra. Canal/Cord: Cervical ventral epidural collection continues throughout the thoracic levels. There is also a focal dorsal component at T3-4 to T5-6, where thecal sac effacement is accentuated and there is cord flattening. No cord edema. Paraspinal and other soft tissues: Paraspinous phlegmon on the left at T8-T12, with new complex left pleural effusion and lower lobe atelectasis Disc levels: T5-6 central disc protrusion. MRI LUMBAR SPINE FINDINGS Segmentation:  5 lumbar type vertebral bodies Alignment:  Grade 1 anterolisthesis at L5-S1. Vertebrae: Marrow edema and heterogeneous enhancement within the L2, L3, and L4 spinous processes. No discitis or facet edema. Conus medullaris: Extends  to the L1 level and is non edematous. There is extensive epidural collection completely effacing the thecal sac throughout the lumbar spine until L4-5 and below where the collection becomes ventral and right eccentric. The infection communicates with extensive bilateral abscess within the intrinsic back muscles via the interspinous space at L3-4. Patient had recent subcutaneous collection and left buttocks collection drainage, with packing seen in place. This midline, upper subcutaneous collection communicates with the paravertebral abscess along its superior margin based on postcontrast axial images. Paraspinal and other soft tissues: As above.  Distended bladder Disc levels: Chronic bilateral pars defects at L5. Critical Value/emergent results were called by telephone at the time of interpretation on 03/22/2018 at 3:04 pm to Dr. Thedore Mins , who verbally acknowledged these results. IMPRESSION: 1. Epidural abscess from C2 to sacrum as described. Maximal cord compression from T3-4 to T5-6. The thecal sac is completely effaced from L1 to L4-5. At the L3-4 interspinous space the spinal abscess communicates with large bilateral abscesses within the intrinsic back muscles. There is osteomyelitis of the L2, L3, and L4 spinous processes. No discitis or facet arthritis. 2. Left paravertebral abscess along the lower thoracic spine with small left empyema that is new from CT 2 days ago. 3. Distended bladder Electronically Signed   By: Marnee Spring M.D.   On: 03/22/2018 15:11   Mr Lumbar Spine W Wo Contrast  Result Date: 03/22/2018 CLINICAL DATA:  Spine infection. EXAM: MRI TOTAL SPINE WITHOUT AND WITH CONTRAST TECHNIQUE: Multisequence MR imaging of the spine from the cervical spine to the sacrum was performed prior to and following IV contrast administration. CONTRAST:  6 cc Gadavist intravenous COMPARISON:  CT of the thoracic and lumbar spine from 2 days ago FINDINGS: MRI CERVICAL SPINE FINDINGS Alignment: Normal  Vertebrae: No evidence of osseous infection. Canal/Cord: There is extensive spinal fluid collection preferentially in the ventral but also in the right more than left dorsal canal. Subarachnoid space is diffusely effaced. Maximal thickness is posterior to C2 at 9 mm. No cord signal abnormality. Posterior Fossa, vertebral arteries, paraspinal tissues: No retropharyngeal or other  discrete soft tissue collection. Disc levels: C2-3: Unremarkable. C3-4: Small left foraminal protrusion with moderate narrowing C4-5: Unremarkable. C5-6: Disc narrowing and bulging with asymmetric left uncovertebral spurring. Left foraminal impingement C6-7: Right foraminal protrusion mild narrowing. C7-T1:Unremarkable. MRI THORACIC SPINE FINDINGS Alignment:  Normal Vertebrae: No evidence of osteomyelitis or discitis. T12 butterfly vertebra. Canal/Cord: Cervical ventral epidural collection continues throughout the thoracic levels. There is also a focal dorsal component at T3-4 to T5-6, where thecal sac effacement is accentuated and there is cord flattening. No cord edema. Paraspinal and other soft tissues: Paraspinous phlegmon on the left at T8-T12, with new complex left pleural effusion and lower lobe atelectasis Disc levels: T5-6 central disc protrusion. MRI LUMBAR SPINE FINDINGS Segmentation:  5 lumbar type vertebral bodies Alignment:  Grade 1 anterolisthesis at L5-S1. Vertebrae: Marrow edema and heterogeneous enhancement within the L2, L3, and L4 spinous processes. No discitis or facet edema. Conus medullaris: Extends to the L1 level and is non edematous. There is extensive epidural collection completely effacing the thecal sac throughout the lumbar spine until L4-5 and below where the collection becomes ventral and right eccentric. The infection communicates with extensive bilateral abscess within the intrinsic back muscles via the interspinous space at L3-4. Patient had recent subcutaneous collection and left buttocks collection  drainage, with packing seen in place. This midline, upper subcutaneous collection communicates with the paravertebral abscess along its superior margin based on postcontrast axial images. Paraspinal and other soft tissues: As above.  Distended bladder Disc levels: Chronic bilateral pars defects at L5. Critical Value/emergent results were called by telephone at the time of interpretation on 03/22/2018 at 3:04 pm to Dr. Thedore Mins , who verbally acknowledged these results. IMPRESSION: 1. Epidural abscess from C2 to sacrum as described. Maximal cord compression from T3-4 to T5-6. The thecal sac is completely effaced from L1 to L4-5. At the L3-4 interspinous space the spinal abscess communicates with large bilateral abscesses within the intrinsic back muscles. There is osteomyelitis of the L2, L3, and L4 spinous processes. No discitis or facet arthritis. 2. Left paravertebral abscess along the lower thoracic spine with small left empyema that is new from CT 2 days ago. 3. Distended bladder Electronically Signed   By: Marnee Spring M.D.   On: 03/22/2018 15:11   Dg Chest Port 1 View  Result Date: 03/30/2018 CLINICAL DATA:  Shortness of breath EXAM: PORTABLE CHEST 1 VIEW COMPARISON:  03/30/2018 at 0227 hours FINDINGS: Moderate layering left pleural effusion, increased. Left lower lobe opacity, atelectasis versus pneumonia. Right lung is clear.  No pneumothorax. The heart is normal in size. IMPRESSION: Moderate layering left pleural effusion, increased. Left lower lobe opacity, atelectasis versus pneumonia. Electronically Signed   By: Charline Bills M.D.   On: 03/30/2018 19:06   Dg Chest Port 1 View  Result Date: 03/30/2018 CLINICAL DATA:  Chest pain EXAM: PORTABLE CHEST 1 VIEW COMPARISON:  None. FINDINGS: Retrocardiac opacity, atelectasis versus pneumonia. Possible small left pleural effusion. Right lung is clear. No pneumothorax. The heart is normal in size. IMPRESSION: Retrocardiac opacity, atelectasis versus  pneumonia. Possible small left pleural effusion. Electronically Signed   By: Charline Bills M.D.   On: 03/30/2018 02:49   [place] Ekg Site Rite  Result Date: 04/04/2018 If Site Rite image not attached, placement could not be confirmed due to current cardiac rhythm.  [place] Ekg Site Rite  Result Date: 03/21/2018 If Site Rite image not attached, placement could not be confirmed due to current cardiac rhythm.    LOS: 19  days   Signature  Shon Hale M.D on 04/08/2018 at 5:03 PM  To page go to [email or web address] - password [facility]

## 2018-04-09 LAB — GLUCOSE, CAPILLARY
GLUCOSE-CAPILLARY: 351 mg/dL — AB (ref 70–99)
Glucose-Capillary: 235 mg/dL — ABNORMAL HIGH (ref 70–99)
Glucose-Capillary: 199 mg/dL — ABNORMAL HIGH (ref 70–99)
Glucose-Capillary: 311 mg/dL — ABNORMAL HIGH (ref 70–99)

## 2018-04-09 LAB — CBC
HCT: 25.8 % — ABNORMAL LOW (ref 39.0–52.0)
HEMOGLOBIN: 7.8 g/dL — AB (ref 13.0–17.0)
MCH: 29.5 pg (ref 26.0–34.0)
MCHC: 30.2 g/dL (ref 30.0–36.0)
MCV: 97.7 fL (ref 80.0–100.0)
NRBC: 0 % (ref 0.0–0.2)
PLATELETS: 367 10*3/uL (ref 150–400)
RBC: 2.64 MIL/uL — ABNORMAL LOW (ref 4.22–5.81)
RDW: 13.5 % (ref 11.5–15.5)
WBC: 8.9 10*3/uL (ref 4.0–10.5)

## 2018-04-09 NOTE — Progress Notes (Signed)
Inpatient Diabetes Program Recommendations  AACE/ADA: New Consensus Statement on Inpatient Glycemic Control (2015)  Target Ranges:  Prepandial:   less than 140 mg/dL      Peak postprandial:   less than 180 mg/dL (1-2 hours)      Critically ill patients:  140 - 180 mg/dL   Lab Results  Component Value Date   GLUCAP 199 (H) 04/09/2018   HGBA1C 12.6 (A) 02/26/2018    Review of Glycemic ControlResults for MORRILL, BOMKAMP (MRN [id number]) as of 04/09/2018 12:16  Ref. Range 04/08/2018 12:10 04/08/2018 17:10 04/08/2018 22:15 04/09/2018 08:06 04/09/2018 12:08  Glucose-Capillary Latest Ref Range: 70 - 99 mg/dL 409 (H) 811 (H) 914 (H) 235 (H) 199 (H)   Diabetes history:DM Outpatient Diabetes medications: Lantus 40 units q HS, Humalog 10 units tid with meals, Metformin 500 mg bid Current orders for Inpatient glycemic control: Novolog sensitive tid with meals and HS, Lantus 25 units q HS, Novolog 4 units tid with meals Inpatient Diabetes Program Recommendations:   Please consider increasing Lantus to 30 units q HS.   Thanks, Beryl Meager, RN, BC-ADM Inpatient Diabetes Coordinator Pager [phone] (8a-5p)

## 2018-04-09 NOTE — Progress Notes (Signed)
During report pt stated to be a Med/surg pt. Orders reviewed and noted to be stepdown with d/c tele order. CN notified and stated pt was changed prior to this shift to MS and to request order clarification with MD. MD paged with same. Awaiting response.

## 2018-04-09 NOTE — Progress Notes (Signed)
PROGRESS NOTE        PATIENT DETAILS  Name: Tony Long  Age: 42 y.o.  Sex: male Date of Birth: Oct 01, 1975 Admit Date: 03/20/2018 Admitting Physician Kendell Bane, MD  ZOX:[id number], Rolm Gala, FNP   Brief Narrative:  [name] a 42 y.o. male history of IVDA, DM-2, hypertension admitted for worsening back pain-further evaluation revealed MSSA bacteremia with tricuspid valve endocarditis, epidural abscess involving C2 through sacrum, and numerous soft tissue back abscesses.  Evaluated by general surgery-underwent I&D of her lower back soft tissue abscess, evaluated by neurosurgery-not felt to be a candidate for decompressive surgery due to extensive nature of the disease and lack of any significant neurological findings.  ID following He was initially kept on IV cefazolin and then antibiotic coverage was broadened to Vancomycin and Rocephin on 03/30/2018 by ID.   Currently on IV nafcillin on 04/16/2018, thereafter 4 weeks of Keflex 500 p.o. twice daily per ID.Marland Kitchen   See below for further details   Subjective:  No new concerns, reluctant to take Foley catheter out  Assessment/Plan:   MSSA Bacteremia with tricuspid valve endocarditis, extensive epidural abscess (C2 through sacrum) and large soft tissue lower back abscess  : due to extensive nature of the epidural abscess and lack of concerning neurological findings-neurosurgery does not recommend decompressive surgery. Seen by ID and current recommendation noted, he was initially kept on IV cefazolin and then antibiotic coverage was broadened to Vancomycin and Rocephin on 03/30/2018 by ID.   Currently on IV nafcillin on 04/16/2018, thereafter 4 weeks of Keflex 500 p.o. twice daily per ID.. D/w Dr Jolayne Haines on 04/09/18, he confirms antibiotic plan above  Given history of IVDA-not a candidate for outpatient IV Abx therapy. He has received a PICC line, continue IV antibiotics through PICC Line.  Patient doing fairly  well,   Patient aware of the extensive nature of this infection-with life-threatening and life disabling risks including quadriplegia along with life-threatening sepsis with this present infection which is nonsurgical.     Cocaine/Substance abuse: Withdrawal symptoms much better-on 9/25 patient developed excessive sedation-Klonopin/Flexeril has been changed to as needed dosing-clonidine being tapered down.  On 03/28/2018 he requested  that he be put back on his Suboxone, he does not want short-acting pain medications anymore,    Polysubstance abuse/IVDA: Counseled to quit.  Anemia.  Normocytic, he had some chronic anemia upon admission as well and now worse due to him dilution from IV fluids also some element of iron deficiency per anemia panel likely due to multiple blood draws.  No signs of ongoing bleeding or acute blood loss, s/p  1 unit of packed RBC on 04/06/2018 and monitor, hgb is > 9  Mild lower extremity leg edema.  ---improving with TED stockings and gentle Lasix on 04/03/2018 & post RBC on 04/06/2018 and monitor.    New onset of left-sided pleuritic chest pain starting on late night 03/29/2018.  Nonspecific EKG changes stable on combination of aspirin, beta-blocker and statin.  Echocardiogram shows no wall motion abnormality and preserved EF, pain is completely resolved.  Hypertension: Monitor on beta-blocker.  Acute urinary retention: Continue Foley and Flomax, trial of Foley removal failed on 04/03/2018, likely combination of lack of activity and spine infection, advised voiding trial again on 04/09/2017 the patient is reluctant to do this  Severe hypokalemia and hypomagnesemia. -Replace and recheck  DM-2:  Poor outpatient control, last A1c 12.6, continue Lantus and sliding scale.  Lantus dose adjusted for better control.   CBG (last 3)  Recent Labs    04/08/18 2215 04/09/18 0806 04/09/18 1208  GLUCAP 373* 235* 199*   Lab Results  Component Value Date   HGBA1C 12.6 (A)  02/26/2018     DVT Prophylaxis: Prophylactic heparin  Code Status: Full code  Family Communication: Parents multiple times clearly explained prognosis remains poor, now DNR.  They have clear understanding of his medical situation.  Disposition Plan: Home after completing IV antibiotics ...04/16/18 or 04/17/18  Antimicrobial agents: Anti-infectives (From admission, onward)   Start     Dose/Rate Route Frequency Ordered Stop   04/01/18 1300  nafcillin 2 g in sodium chloride 0.9 % 100 mL IVPB     2 g 200 mL/hr over 30 Minutes Intravenous Every 4 hours 04/01/18 1242     04/01/18 1200  nafcillin injection 2 g  Status:  Discontinued     2 g Intravenous Every 4 hours 04/01/18 1129 04/01/18 1242   03/31/18 0200  vancomycin (VANCOCIN) IVPB 750 mg/150 ml premix  Status:  Discontinued     750 mg 150 mL/hr over 60 Minutes Intravenous Every 8 hours 03/30/18 1628 04/01/18 1129   03/31/18 0100  meropenem (MERREM) 1 g in sodium chloride 0.9 % 100 mL IVPB  Status:  Discontinued     1 g 200 mL/hr over 30 Minutes Intravenous Every 8 hours 03/30/18 1628 04/01/18 1129   03/30/18 1630  vancomycin (VANCOCIN) 1,500 mg in sodium chloride 0.9 % 500 mL IVPB     1,500 mg 250 mL/hr over 120 Minutes Intravenous NOW 03/30/18 1619 03/30/18 1925   03/30/18 1630  meropenem (MERREM) 2 g in sodium chloride 0.9 % 100 mL IVPB     2 g 200 mL/hr over 30 Minutes Intravenous NOW 03/30/18 1619 03/30/18 1754   03/21/18 0930  ceFAZolin (ANCEF) IVPB 2g/100 mL premix  Status:  Discontinued     2 g 200 mL/hr over 30 Minutes Intravenous Every 8 hours 03/21/18 0920 03/30/18 1616      Procedures:   MRI C, T and L-spine done 03/30/2018 and 03/31/2018.    CT angiogram chest 03/30/2018.  1. No pulmonary embolus. 2. Moderate loculated left pleural effusion. Adjacent airspace disease likely compressive atelectasis, pneumonia not excluded in the setting of spinal infection and endocarditis. Mild right pleural thickening or  loculated pleural fluid in the upper right hemithorax.  Repeat echocardiogram 03/30/2018 - Left ventricle: The cavity size was normal. Wall thickness was normal. Systolic function was normal. The estimated ejection fraction was in the range of 60% to 65%. Wall motion was normal; there were no regional wall motion abnormalities. Left ventricular diastolic function parameters were normal.  TEE - - Left ventricle: The cavity size was normal. Wall thickness wasnormal. Systolic function was normal. The estimated ejectionfraction was in the range of 60% to 65%. - Aortic valve: No evidence of vegetation. - Mitral valve: No evidence of vegetation. - Left atrium: No evidence of thrombus in the atrial cavity or appendage. No evidence of thrombus in the appendage. - Right atrium: No evidence of thrombus in the atrial cavity orappendage. - Tricuspid valve: There was a vegetation. There was a small (1.2cmx .6cm), mobile vegetation on the septal leaflet. There was mildregurgitation. - Pulmonic valve: No evidence of vegetation.  Impressions:   Tricuspid valve endocarditis. Discussed with primary team   MRI C-T-L Spine - 1. Epidural abscess from  C2 to sacrum as described. Maximal cord compression from T3-4 to T5-6. The thecal sac is completely effaced from L1 to L4-5. At the L3-4 interspinous space the spinal abscess communicates with large bilateral abscesses within the intrinsic back muscles. There is osteomyelitis of the L2, L3, and L4 spinous processes. No discitis or facet arthritis. 2. Left paravertebral abscess along the lower thoracic spine with small left empyema that is new from CT 2 days ago. 3. Distended bladder  CT - 1. Evidence of cellulitis involving the right side of the base of the neck extending into the right supraclavicular region with fluid and gas in the soft tissues at the base of the right side of the neck. 2. No significant abnormality of the thoracic spine. 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  CT -  1. Extensive abnormal fluid collection in the subcutaneous fat of the midline of the back with underlying marked abnormality of the posterior paraspinal musculature from L1-2 through S3. This is worrisome for subcutaneous abscess and myositis. 2. Moth-eaten appearance of the spinous processes of L3 and L4 consistent with osteomyelitis. 3. No discrete epidural abscess. However, the abnormal edema in the paraspinal musculature extends to the posterior aspect of the spinal canal at L3-4. 4. MRI with and without contrast may better define the extent of the soft tissue and infection and could detect epidural extension that is not apparent on this unenhanced CT scan.  9/22>> PICC line  I&D by [facility] of back soft tissue abscess on -  03/22/18  CONSULTS:  N.Surgery, ID, general surgery, PCCM    MEDICATIONS:  Scheduled Meds:  . aspirin  81 mg Oral Daily  . atorvastatin  40 mg Oral q1800  . buprenorphine-naloxone  2 tablet Sublingual Daily  . feeding supplement (PRO-STAT SUGAR FREE 64)  30 mL Oral TID WC  . gabapentin  300 mg Oral TID  . heparin injection (subcutaneous)  5,000 Units Subcutaneous Q8H  . insulin aspart  0-5 Units Subcutaneous QHS  . insulin aspart  0-9 Units Subcutaneous TID WC  . insulin aspart  4 Units Subcutaneous TID WC  . insulin glargine  25 Units Subcutaneous QHS  . metoprolol tartrate  50 mg Oral BID  . nicotine  21 mg Transdermal Daily  . polyethylene glycol  17 g Oral BID  . senna-docusate  2 tablet Oral QHS  . tamsulosin  0.4 mg Oral BID   Continuous Infusions: . nafcillin IV 2 g (04/09/18 1229)   PRN Meds:.acetaminophen, bisacodyl, clonazepam, cyclobenzaprine, hydrALAZINE, LORazepam, magnesium hydroxide, metoprolol tartrate, nitroGLYCERIN, sodium chloride flush, sodium phosphate   PHYSICAL EXAM: Vital signs: Vitals:   04/08/18 0840 04/08/18 1334 04/08/18 2133 04/09/18 0531  BP: 125/80 119/80 111/70 126/68  Pulse: 91 88 98 87    Resp:  18 16 18   Temp:  99.4 F (37.4 C) 99 F (37.2 C) 98.9 F (37.2 C)  TempSrc:  Oral Oral Oral  SpO2:  95% 95% 94%  Weight:      Height:       Filed Weights   03/21/18 0602  Weight: 68 kg   Body mass index is 20.92 kg/m.   Exam  Awake Alert, Oriented X 3, No new F.N deficits, Normal affect [place].AT,PERRAL Supple Neck,No JVD,   Lungs- CTA, fair air movement bilaterally CV-RRR, S1-S2 normal  Abd-+ve B.Sounds, Abd Soft, No tenderness,   LE--- 2+ ankle edema bilaterally Neuro-L Leg 5/5 GU- Foley with clear urine   LABORATORY DATA: CBC: Recent Labs  Lab 04/05/18  1610 04/05/18 1133 04/06/18 0355 04/07/18 0357 04/09/18 0317  WBC 10.2  --  10.6* 9.1 8.9  HGB 6.7* 7.3* 7.2* 8.1* 7.8*  HCT 22.2* 23.3* 23.8* 27.2* 25.8*  MCV 98.7  --  98.8 97.1 97.7  PLT 306  --  328 382 367    Basic Metabolic Panel: Recent Labs  Lab 04/04/18 0634 04/05/18 0754 04/06/18 0355 04/07/18 0357  NA 138 136 136 137  K 3.0* 2.4* 3.9 3.6  CL 100 98 96* 97*  CO2 32 33* 34* 31  GLUCOSE 258* 169* 210* 289*  BUN 10 <5* 6 8  CREATININE 0.66 0.49* 0.61 0.70  CALCIUM 7.7* 6.6* 7.5* 7.8*  MG 1.6* 1.5* 1.9 1.7    GFR: Estimated Creatinine Clearance: 116.9 mL/min (by C-G formula based on SCr of 0.7 mg/dL).  Liver Function Tests: No results for input(s): AST, ALT, ALKPHOS, BILITOT, PROT, ALBUMIN in the last 168 hours. No results for input(s): LIPASE, AMYLASE in the last 168 hours. No results for input(s): AMMONIA in the last 168 hours.  Coagulation Profile: No results for input(s): INR, PROTIME in the last 168 hours.  Cardiac Enzymes: No results for input(s): CKTOTAL, CKMB, CKMBINDEX, TROPONINI in the last 168 hours.  BNP (last 3 results) No results for input(s): PROBNP in the last 8760 hours.  HbA1C: No results for input(s): HGBA1C in the last 72 hours.  CBG: Recent Labs  Lab 04/08/18 1210 04/08/18 1710 04/08/18 2215 04/09/18 0806 04/09/18 1208  GLUCAP 186* 312* 373*  235* 199*    Lipid Profile: No results for input(s): CHOL, HDL, LDLCALC, TRIG, CHOLHDL, LDLDIRECT in the last 72 hours.  Thyroid Function Tests: No results for input(s): TSH, T4TOTAL, FREET4, T3FREE, THYROIDAB in the last 72 hours.  Anemia Panel: No results for input(s): VITAMINB12, FOLATE, FERRITIN, TIBC, IRON, RETICCTPCT in the last 72 hours.  Urine analysis:    Component Value Date/Time   COLORURINE YELLOW 03/30/2018 1930   APPEARANCEUR CLEAR 03/30/2018 1930   LABSPEC 1.009 03/30/2018 1930   PHURINE 6.0 03/30/2018 1930   GLUCOSEU 50 (A) 03/30/2018 1930   HGBUR SMALL (A) 03/30/2018 1930   BILIRUBINUR NEGATIVE 03/30/2018 1930   BILIRUBINUR small 02/26/2018 1202   KETONESUR NEGATIVE 03/30/2018 1930   PROTEINUR NEGATIVE 03/30/2018 1930   UROBILINOGEN 1.0 02/26/2018 1202   UROBILINOGEN 1.0 12/11/2016 1020   NITRITE NEGATIVE 03/30/2018 1930   LEUKOCYTESUR TRACE (A) 03/30/2018 1930    Sepsis Labs: Lactic Acid, Venous    Component Value Date/Time   LATICACIDVEN 1.2 03/30/2018 1837    MICROBIOLOGY: Recent Results (from the past 240 hour(s))  Culture, Urine     Status: None   Collection Time: 03/30/18  4:20 PM  Result Value Ref Range Status   Specimen Description URINE, CATHETERIZED  Final   Special Requests NONE  Final   Culture   Final    NO GROWTH Performed at [facility], [address] [place], [place] [address]    Report Status 04/01/2018 FINAL  Final  Culture, blood (Routine X 2) w Reflex to ID Panel     Status: None   Collection Time: 04/01/18  9:49 AM  Result Value Ref Range Status   Specimen Description BLOOD BLOOD RIGHT HAND  Final   Special Requests   Final    BOTTLES DRAWN AEROBIC ONLY Blood Culture results may not be optimal due to an inadequate volume of blood received in culture bottles   Culture   Final    NO GROWTH 5  DAYS Performed at [facility], [address], [place], [place] [address]    Report Status 04/06/2018 FINAL  Final    Culture, blood (Routine X 2) w Reflex to ID Panel     Status: None   Collection Time: 04/01/18 10:02 AM  Result Value Ref Range Status   Specimen Description BLOOD RIGHT ANTECUBITAL  Final   Special Requests   Final    BOTTLES DRAWN AEROBIC ONLY Blood Culture adequate volume   Culture   Final    NO GROWTH 5 DAYS Performed at [facility], [address] [place], [place] [address]    Report Status 04/06/2018 FINAL  Final    RADIOLOGY STUDIES/RESULTS:  Ct Angio Chest Pe W Or Wo Contrast  Result Date: 03/30/2018 CLINICAL DATA:  Shortness of breath PE suspected, high pretest prob. Patient with known epidural abscess and endocarditis. Now with left-sided chest pain. EXAM: CT ANGIOGRAPHY CHEST WITH CONTRAST TECHNIQUE: Multidetector CT imaging of the chest was performed using the standard protocol during bolus administration of intravenous contrast. Multiplanar CT image reconstructions and MIPs were obtained to evaluate the vascular anatomy. CONTRAST:  54mL ISOVUE-370 IOPAMIDOL (ISOVUE-370) INJECTION 76% COMPARISON:  Chest radiographs earlier this day. FINDINGS: Cardiovascular: There are no filling defects within the pulmonary arteries to suggest pulmonary embolus. The thoracic aorta is normal in caliber without dissection. Mild cardiomegaly. Small amount pericardial fluid. Mediastinum/Nodes: Loculated left pleural effusion tracks along the left aspect of the mediastinum. Small left hilar nodes without bulky adenopathy. No enlarged mediastinal nodes. No dominant thyroid nodule. The esophagus is decompressed. Lungs/Pleura: Moderate left pleural effusion is loculated and tracks along the posterior, lateral, and medial hemithorax. Adjacent airspace disease in the left lower lobe, favoring compressive atelectasis, with adjacent compressive atelectasis in the right upper lobe. Pleural thickening versus loculated pleural effusion the upper medial right hemithorax, with additional areas of pleural  thickening dependently. Dependent atelectasis in the right lower lobe. Trachea and bronchi are patent. Upper Abdomen: No acute findings. Musculoskeletal: Patient with known epidural abscess, not well delineated by CT. No bony destructive change. Review of the MIP images confirms the above findings. IMPRESSION: 1. No pulmonary embolus. 2. Moderate loculated left pleural effusion. Adjacent airspace disease likely compressive atelectasis, pneumonia not excluded in the setting of spinal infection and endocarditis. Mild right pleural thickening or loculated pleural fluid in the upper right hemithorax. Electronically Signed   By: Narda Rutherford M.D.   On: 03/30/2018 23:21   Ct Thoracic Spine Wo Contrast  Result Date: 03/20/2018 CLINICAL DATA:  Progressive back pain.  Swelling of the lower back. EXAM: CT THORACIC SPINE WITHOUT CONTRAST TECHNIQUE: Multidetector CT images of the thoracic were obtained using the standard protocol without intravenous contrast. COMPARISON:  None. FINDINGS: Alignment: Normal. 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 Paraspinal and other soft tissues: There is abnormal gas and fluid in the soft tissues of the right side of the base of the neck and in the right supraclavicular region. Paraspinal soft tissues appear normal throughout the thoracic spine. Disc levels: There is no evidence of disc protrusion or significant disc bulging or spinal or foraminal stenosis or other significant abnormality of the thoracic spine. IMPRESSION: 1. Evidence of cellulitis involving the right side of the base of the neck extending into the right supraclavicular region with fluid and gas in the soft tissues at the base of the right side of the neck. 2. No significant abnormality of the thoracic spine. 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 Electronically  Signed   By: Francene Boyers M.D.   On: 03/20/2018 11:25   Ct Lumbar Spine Wo Contrast  Addendum Date: 03/20/2018   ADDENDUM REPORT: 03/20/2018  11:44 ADDENDUM: Critical Value/emergent results were called by telephone at the time of interpretation on 03/20/2018 at 11:30 am to Dr. Sharyn Creamer , who verbally acknowledged these results. Electronically Signed   By: Francene Boyers M.D.   On: 03/20/2018 11:44   Result Date: 03/20/2018 CLINICAL DATA:  Increasing low back pain and soft tissue swelling. EXAM: CT LUMBAR SPINE WITHOUT CONTRAST TECHNIQUE: Multidetector CT imaging of the lumbar spine was performed without intravenous contrast administration. Multiplanar CT image reconstructions were also generated. IV contrast could not be utilized due to the lack of an appropriate IV. COMPARISON:  None. FINDINGS: Segmentation: 5 lumbar type vertebrae. Alignment: Normal. Vertebrae: There is a moth-eaten appearance of the spinous processes of L3 and L4 which is worrisome for osteomyelitis. Bilateral pars defects at L5 with grade 1 spondylolisthesis. 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 Paraspinal and other soft tissues: There is an extensive abnormal fluid collection in the subcutaneous soft tissues of the posterior aspect of the back extending from approximately L1-2 to S3. This fluid collection is lobulated and measures approximately 20 x 9 x 2.5 cm. It is centered slightly to the left of midline and has a mass effect upon the adjacent posterior paraspinal muscles. There is abnormal lucency in the underlying paraspinal muscles which could represent myositis. Disc levels: T11-12: No significant abnormality. Butterfly T12 vertebra. T12-L1: No significant abnormality. L1-2: Normal disc. Abnormal edema in the posterior paraspinal musculature with adjacent fluid collection in the subcutaneous fat of the posterior aspect of the back as described above. L2-3: Normal disc. L3-4: Normal disc. Lucency in the posterior paraspinal soft tissues extends to the posterior aspect of the thecal sac on image 80 of series 4 but there is no discrete epidural abscess. L4-5: Normal disc.  No  evidence of epidural abscess. L5-S1: Grade 1 spondylolisthesis. No disc bulging or protrusion. Bilateral pars defects. No visible epidural abscess. IMPRESSION: 1. Extensive abnormal fluid collection in the subcutaneous fat of the midline of the back with underlying marked abnormality of the posterior paraspinal musculature from L1-2 through S3. This is worrisome for subcutaneous abscess and myositis. 2. Moth-eaten appearance of the spinous processes of L3 and L4 consistent with osteomyelitis. 3. No discrete epidural abscess. However, the abnormal edema in the paraspinal musculature extends to the posterior aspect of the spinal canal at L3-4. 4. MRI with and without contrast may better define the extent of the soft tissue and infection and could detect epidural extension that is not apparent on this unenhanced CT scan. Electronically Signed: By: Francene Boyers M.D. On: 03/20/2018 11:18   Mr Cervical Spine Wo Contrast  Result Date: 03/30/2018 CLINICAL DATA:  41 year old male with history of MSSA bacteremia with known endocarditis and epidural abscess extending from C2 through the sacrum. Recent episode of hypotension and acute lower extremity weakness. Evaluate abscess and possible spinal cord infarct. EXAM: MRI CERVICAL, THORACIC SPINE WITHOUT CONTRAST TECHNIQUE: Multiplanar and multiecho pulse sequences of the cervical spine, to include the craniocervical junction and cervicothoracic junction, and thoracic and lumbar spine, were obtained without intravenous contrast. COMPARISON:  Prior MRI from 03/22/2018. FINDINGS: MRI CERVICAL SPINE FINDINGS Alignment: Examination technically limited as the patient was unable to tolerate the full length of the exam. Additionally, images provided are degraded by motion artifact. Reversal of the normal cervical lordosis with apex at C5, stable.  No interval listhesis or malalignment. Vertebrae: Vertebral body height maintained without acute or interval fracture. Bone marrow  signal intensity diffusely decreased on T1 weighted imaging, suspected to be related to anemia and chronic disease. No discrete osseous lesions. No abnormal marrow edema. No evidence for interval discitis. Cord: Signal intensity within the cervical spinal cord is within normal limits. No findings to suggest interval cord infarction on this motion degraded and limited exam. Previously identified epidural collection involving the ventral epidural space extending from C2 inferiorly is decreased in size, now measuring up to 7 mm in maximal AP diameter at the level of C2. Collection diffusely involves the ventral epidural space, but also is seen dorsally as well. Slightly improved diffuse spinal stenosis with thecal sac patency. Posterior Fossa, vertebral arteries, paraspinal tissues: Visualized brain and posterior fossa within normal limits. Craniocervical junction normal. Scattered edema within the posterior paraspinous soft tissues. Normal intravascular flow voids seen within the vertebral arteries bilaterally. Disc levels: C2-C3: Unremarkable. C3-C4: Small left foraminal protrusion with associated moderate left C4 foraminal stenosis. C4-C5:  Unremarkable. C5-C6: Left eccentric disc bulge with uncovertebral hypertrophy. Moderate left C6 foraminal narrowing. C6-C7: Right foraminal disc protrusion with associated moderate right C7 foraminal stenosis. C7-T1:  Unremarkable. MRI THORACIC SPINE FINDINGS Alignment: Examination markedly limited as the patient was unable to tolerate the full length of the exam. Sagittal T1, T2, and STIR sequences only were performed. No axial images obtained. Vertebral bodies normally aligned with preservation of the normal thoracic kyphosis. Vertebrae: Vertebral body height maintained without evidence for interval fracture. Butterfly vertebra noted at T12. Diffusely decreased T1 weighted signal intensity throughout the visualized bone marrow, like related to anemia chronic disease. No  findings to suggest interval or new discitis or septic arthritis. Cord: Signal intensity within the thoracic spinal cord grossly within normal limits on these limited sagittal views. No definite cord signal abnormality or cord edema to suggest acute spinal cord infarction. Previously seen diffuse epidural collection appears overall decreased in size from previous, with improved spinal stenosis and thecal sac patency. Paraspinal and other soft tissues: Scattered edema seen within the posterior paraspinous soft tissues at the upper back/cervicothoracic junction. No discrete soft tissue collections. Disc levels: T5-6: Central disc protrusion indenting upon the ventral thoracic spinal cord, stable. IMPRESSION: 1. Technically limited exam due to motion artifact and the patient's inability to tolerate the full length of the exam. Cervical and thoracic spine only was imaged, and only sagittal sequences of the thoracic spine were obtained. 2. No imaging findings to suggest spinal cord infarction identified on this limited exam. 3. Interval improvement in diffuse epidural collection, decreased in size as compared to previous exam with improved diffuse spinal stenosis and thecal sac patency. No new discitis or facet arthritis. Electronically Signed   By: Rise Mu M.D.   On: 03/30/2018 22:52   Mr Thoracic Spine Wo Contrast  Result Date: 03/30/2018 CLINICAL DATA:  42 year old male with history of MSSA bacteremia with known endocarditis and epidural abscess extending from C2 through the sacrum. Recent episode of hypotension and acute lower extremity weakness. Evaluate abscess and possible spinal cord infarct. EXAM: MRI CERVICAL, THORACIC SPINE WITHOUT CONTRAST TECHNIQUE: Multiplanar and multiecho pulse sequences of the cervical spine, to include the craniocervical junction and cervicothoracic junction, and thoracic and lumbar spine, were obtained without intravenous contrast. COMPARISON:  Prior MRI from  03/22/2018. FINDINGS: MRI CERVICAL SPINE FINDINGS Alignment: Examination technically limited as the patient was unable to tolerate the full length of the exam. Additionally, images  provided are degraded by motion artifact. Reversal of the normal cervical lordosis with apex at C5, stable. No interval listhesis or malalignment. Vertebrae: Vertebral body height maintained without acute or interval fracture. Bone marrow signal intensity diffusely decreased on T1 weighted imaging, suspected to be related to anemia and chronic disease. No discrete osseous lesions. No abnormal marrow edema. No evidence for interval discitis. Cord: Signal intensity within the cervical spinal cord is within normal limits. No findings to suggest interval cord infarction on this motion degraded and limited exam. Previously identified epidural collection involving the ventral epidural space extending from C2 inferiorly is decreased in size, now measuring up to 7 mm in maximal AP diameter at the level of C2. Collection diffusely involves the ventral epidural space, but also is seen dorsally as well. Slightly improved diffuse spinal stenosis with thecal sac patency. Posterior Fossa, vertebral arteries, paraspinal tissues: Visualized brain and posterior fossa within normal limits. Craniocervical junction normal. Scattered edema within the posterior paraspinous soft tissues. Normal intravascular flow voids seen within the vertebral arteries bilaterally. Disc levels: C2-C3: Unremarkable. C3-C4: Small left foraminal protrusion with associated moderate left C4 foraminal stenosis. C4-C5:  Unremarkable. C5-C6: Left eccentric disc bulge with uncovertebral hypertrophy. Moderate left C6 foraminal narrowing. C6-C7: Right foraminal disc protrusion with associated moderate right C7 foraminal stenosis. C7-T1:  Unremarkable. MRI THORACIC SPINE FINDINGS Alignment: Examination markedly limited as the patient was unable to tolerate the full length of the exam.  Sagittal T1, T2, and STIR sequences only were performed. No axial images obtained. Vertebral bodies normally aligned with preservation of the normal thoracic kyphosis. Vertebrae: Vertebral body height maintained without evidence for interval fracture. Butterfly vertebra noted at T12. Diffusely decreased T1 weighted signal intensity throughout the visualized bone marrow, like related to anemia chronic disease. No findings to suggest interval or new discitis or septic arthritis. Cord: Signal intensity within the thoracic spinal cord grossly within normal limits on these limited sagittal views. No definite cord signal abnormality or cord edema to suggest acute spinal cord infarction. Previously seen diffuse epidural collection appears overall decreased in size from previous, with improved spinal stenosis and thecal sac patency. Paraspinal and other soft tissues: Scattered edema seen within the posterior paraspinous soft tissues at the upper back/cervicothoracic junction. No discrete soft tissue collections. Disc levels: T5-6: Central disc protrusion indenting upon the ventral thoracic spinal cord, stable. IMPRESSION: 1. Technically limited exam due to motion artifact and the patient's inability to tolerate the full length of the exam. Cervical and thoracic spine only was imaged, and only sagittal sequences of the thoracic spine were obtained. 2. No imaging findings to suggest spinal cord infarction identified on this limited exam. 3. Interval improvement in diffuse epidural collection, decreased in size as compared to previous exam with improved diffuse spinal stenosis and thecal sac patency. No new discitis or facet arthritis. Electronically Signed   By: Rise Mu M.D.   On: 03/30/2018 22:52   Mr Cervical Spine W Wo Contrast  Result Date: 03/22/2018 CLINICAL DATA:  Spine infection. EXAM: MRI TOTAL SPINE WITHOUT AND WITH CONTRAST TECHNIQUE: Multisequence MR imaging of the spine from the cervical spine to  the sacrum was performed prior to and following IV contrast administration. CONTRAST:  6 cc Gadavist intravenous COMPARISON:  CT of the thoracic and lumbar spine from 2 days ago FINDINGS: MRI CERVICAL SPINE FINDINGS Alignment: Normal Vertebrae: No evidence of osseous infection. Canal/Cord: There is extensive spinal fluid collection preferentially in the ventral but also in the right more than  left dorsal canal. Subarachnoid space is diffusely effaced. Maximal thickness is posterior to C2 at 9 mm. No cord signal abnormality. Posterior Fossa, vertebral arteries, paraspinal tissues: No retropharyngeal or other discrete soft tissue collection. Disc levels: C2-3: Unremarkable. C3-4: Small left foraminal protrusion with moderate narrowing C4-5: Unremarkable. C5-6: Disc narrowing and bulging with asymmetric left uncovertebral spurring. Left foraminal impingement C6-7: Right foraminal protrusion mild narrowing. C7-T1:Unremarkable. MRI THORACIC SPINE FINDINGS Alignment:  Normal Vertebrae: No evidence of osteomyelitis or discitis. T12 butterfly vertebra. Canal/Cord: Cervical ventral epidural collection continues throughout the thoracic levels. There is also a focal dorsal component at T3-4 to T5-6, where thecal sac effacement is accentuated and there is cord flattening. No cord edema. Paraspinal and other soft tissues: Paraspinous phlegmon on the left at T8-T12, with new complex left pleural effusion and lower lobe atelectasis Disc levels: T5-6 central disc protrusion. MRI LUMBAR SPINE FINDINGS Segmentation:  5 lumbar type vertebral bodies Alignment:  Grade 1 anterolisthesis at L5-S1. Vertebrae: Marrow edema and heterogeneous enhancement within the L2, L3, and L4 spinous processes. No discitis or facet edema. Conus medullaris: Extends to the L1 level and is non edematous. There is extensive epidural collection completely effacing the thecal sac throughout the lumbar spine until L4-5 and below where the collection becomes  ventral and right eccentric. The infection communicates with extensive bilateral abscess within the intrinsic back muscles via the interspinous space at L3-4. Patient had recent subcutaneous collection and left buttocks collection drainage, with packing seen in place. This midline, upper subcutaneous collection communicates with the paravertebral abscess along its superior margin based on postcontrast axial images. Paraspinal and other soft tissues: As above.  Distended bladder Disc levels: Chronic bilateral pars defects at L5. Critical Value/emergent results were called by telephone at the time of interpretation on 03/22/2018 at 3:04 pm to Dr. Thedore Mins , who verbally acknowledged these results. IMPRESSION: 1. Epidural abscess from C2 to sacrum as described. Maximal cord compression from T3-4 to T5-6. The thecal sac is completely effaced from L1 to L4-5. At the L3-4 interspinous space the spinal abscess communicates with large bilateral abscesses within the intrinsic back muscles. There is osteomyelitis of the L2, L3, and L4 spinous processes. No discitis or facet arthritis. 2. Left paravertebral abscess along the lower thoracic spine with small left empyema that is new from CT 2 days ago. 3. Distended bladder Electronically Signed   By: Marnee Spring M.D.   On: 03/22/2018 15:11   Mr Thoracic Spine W Wo Contrast  Result Date: 03/22/2018 CLINICAL DATA:  Spine infection. EXAM: MRI TOTAL SPINE WITHOUT AND WITH CONTRAST TECHNIQUE: Multisequence MR imaging of the spine from the cervical spine to the sacrum was performed prior to and following IV contrast administration. CONTRAST:  6 cc Gadavist intravenous COMPARISON:  CT of the thoracic and lumbar spine from 2 days ago FINDINGS: MRI CERVICAL SPINE FINDINGS Alignment: Normal Vertebrae: No evidence of osseous infection. Canal/Cord: There is extensive spinal fluid collection preferentially in the ventral but also in the right more than left dorsal canal. Subarachnoid  space is diffusely effaced. Maximal thickness is posterior to C2 at 9 mm. No cord signal abnormality. Posterior Fossa, vertebral arteries, paraspinal tissues: No retropharyngeal or other discrete soft tissue collection. Disc levels: C2-3: Unremarkable. C3-4: Small left foraminal protrusion with moderate narrowing C4-5: Unremarkable. C5-6: Disc narrowing and bulging with asymmetric left uncovertebral spurring. Left foraminal impingement C6-7: Right foraminal protrusion mild narrowing. C7-T1:Unremarkable. MRI THORACIC SPINE FINDINGS Alignment:  Normal Vertebrae: No evidence of osteomyelitis or  discitis. T12 butterfly vertebra. Canal/Cord: Cervical ventral epidural collection continues throughout the thoracic levels. There is also a focal dorsal component at T3-4 to T5-6, where thecal sac effacement is accentuated and there is cord flattening. No cord edema. Paraspinal and other soft tissues: Paraspinous phlegmon on the left at T8-T12, with new complex left pleural effusion and lower lobe atelectasis Disc levels: T5-6 central disc protrusion. MRI LUMBAR SPINE FINDINGS Segmentation:  5 lumbar type vertebral bodies Alignment:  Grade 1 anterolisthesis at L5-S1. Vertebrae: Marrow edema and heterogeneous enhancement within the L2, L3, and L4 spinous processes. No discitis or facet edema. Conus medullaris: Extends to the L1 level and is non edematous. There is extensive epidural collection completely effacing the thecal sac throughout the lumbar spine until L4-5 and below where the collection becomes ventral and right eccentric. The infection communicates with extensive bilateral abscess within the intrinsic back muscles via the interspinous space at L3-4. Patient had recent subcutaneous collection and left buttocks collection drainage, with packing seen in place. This midline, upper subcutaneous collection communicates with the paravertebral abscess along its superior margin based on postcontrast axial images. Paraspinal  and other soft tissues: As above.  Distended bladder Disc levels: Chronic bilateral pars defects at L5. Critical Value/emergent results were called by telephone at the time of interpretation on 03/22/2018 at 3:04 pm to Dr. Thedore Mins , who verbally acknowledged these results. IMPRESSION: 1. Epidural abscess from C2 to sacrum as described. Maximal cord compression from T3-4 to T5-6. The thecal sac is completely effaced from L1 to L4-5. At the L3-4 interspinous space the spinal abscess communicates with large bilateral abscesses within the intrinsic back muscles. There is osteomyelitis of the L2, L3, and L4 spinous processes. No discitis or facet arthritis. 2. Left paravertebral abscess along the lower thoracic spine with small left empyema that is new from CT 2 days ago. 3. Distended bladder Electronically Signed   By: Marnee Spring M.D.   On: 03/22/2018 15:11   Mr Lumbar Spine W Wo Contrast  Result Date: 03/22/2018 CLINICAL DATA:  Spine infection. EXAM: MRI TOTAL SPINE WITHOUT AND WITH CONTRAST TECHNIQUE: Multisequence MR imaging of the spine from the cervical spine to the sacrum was performed prior to and following IV contrast administration. CONTRAST:  6 cc Gadavist intravenous COMPARISON:  CT of the thoracic and lumbar spine from 2 days ago FINDINGS: MRI CERVICAL SPINE FINDINGS Alignment: Normal Vertebrae: No evidence of osseous infection. Canal/Cord: There is extensive spinal fluid collection preferentially in the ventral but also in the right more than left dorsal canal. Subarachnoid space is diffusely effaced. Maximal thickness is posterior to C2 at 9 mm. No cord signal abnormality. Posterior Fossa, vertebral arteries, paraspinal tissues: No retropharyngeal or other discrete soft tissue collection. Disc levels: C2-3: Unremarkable. C3-4: Small left foraminal protrusion with moderate narrowing C4-5: Unremarkable. C5-6: Disc narrowing and bulging with asymmetric left uncovertebral spurring. Left foraminal  impingement C6-7: Right foraminal protrusion mild narrowing. C7-T1:Unremarkable. MRI THORACIC SPINE FINDINGS Alignment:  Normal Vertebrae: No evidence of osteomyelitis or discitis. T12 butterfly vertebra. Canal/Cord: Cervical ventral epidural collection continues throughout the thoracic levels. There is also a focal dorsal component at T3-4 to T5-6, where thecal sac effacement is accentuated and there is cord flattening. No cord edema. Paraspinal and other soft tissues: Paraspinous phlegmon on the left at T8-T12, with new complex left pleural effusion and lower lobe atelectasis Disc levels: T5-6 central disc protrusion. MRI LUMBAR SPINE FINDINGS Segmentation:  5 lumbar type vertebral bodies Alignment:  Grade 1  anterolisthesis at L5-S1. Vertebrae: Marrow edema and heterogeneous enhancement within the L2, L3, and L4 spinous processes. No discitis or facet edema. Conus medullaris: Extends to the L1 level and is non edematous. There is extensive epidural collection completely effacing the thecal sac throughout the lumbar spine until L4-5 and below where the collection becomes ventral and right eccentric. The infection communicates with extensive bilateral abscess within the intrinsic back muscles via the interspinous space at L3-4. Patient had recent subcutaneous collection and left buttocks collection drainage, with packing seen in place. This midline, upper subcutaneous collection communicates with the paravertebral abscess along its superior margin based on postcontrast axial images. Paraspinal and other soft tissues: As above.  Distended bladder Disc levels: Chronic bilateral pars defects at L5. Critical Value/emergent results were called by telephone at the time of interpretation on 03/22/2018 at 3:04 pm to Dr. Thedore Mins , who verbally acknowledged these results. IMPRESSION: 1. Epidural abscess from C2 to sacrum as described. Maximal cord compression from T3-4 to T5-6. The thecal sac is completely effaced from L1 to  L4-5. At the L3-4 interspinous space the spinal abscess communicates with large bilateral abscesses within the intrinsic back muscles. There is osteomyelitis of the L2, L3, and L4 spinous processes. No discitis or facet arthritis. 2. Left paravertebral abscess along the lower thoracic spine with small left empyema that is new from CT 2 days ago. 3. Distended bladder Electronically Signed   By: Marnee Spring M.D.   On: 03/22/2018 15:11   Dg Chest Port 1 View  Result Date: 03/30/2018 CLINICAL DATA:  Shortness of breath EXAM: PORTABLE CHEST 1 VIEW COMPARISON:  03/30/2018 at 0227 hours FINDINGS: Moderate layering left pleural effusion, increased. Left lower lobe opacity, atelectasis versus pneumonia. Right lung is clear.  No pneumothorax. The heart is normal in size. IMPRESSION: Moderate layering left pleural effusion, increased. Left lower lobe opacity, atelectasis versus pneumonia. Electronically Signed   By: Charline Bills M.D.   On: 03/30/2018 19:06   Dg Chest Port 1 View  Result Date: 03/30/2018 CLINICAL DATA:  Chest pain EXAM: PORTABLE CHEST 1 VIEW COMPARISON:  None. FINDINGS: Retrocardiac opacity, atelectasis versus pneumonia. Possible small left pleural effusion. Right lung is clear. No pneumothorax. The heart is normal in size. IMPRESSION: Retrocardiac opacity, atelectasis versus pneumonia. Possible small left pleural effusion. Electronically Signed   By: Charline Bills M.D.   On: 03/30/2018 02:49   [place] Ekg Site Rite  Result Date: 04/04/2018 If Site Rite image not attached, placement could not be confirmed due to current cardiac rhythm.  [place] Ekg Site Rite  Result Date: 03/21/2018 If Site Rite image not attached, placement could not be confirmed due to current cardiac rhythm.    LOS: 20 days   Signature  Shon Hale M.D on 04/09/2018 at 4:14 PM  To page go to [email or web address] - password [email or web address]

## 2018-04-09 NOTE — Progress Notes (Signed)
Patient foley catheter was removedduring the day.  No urine output since the removal. Patient complained of lower abdominal discomfort.  Greater than 999 found when the patient was bladder scanned.  Notified Linton Flemings, NP.  Will continue to monitor the patient.

## 2018-04-09 NOTE — Progress Notes (Addendum)
Subjective: Patient reports "Still kicking"  Objective: Vital signs in last 24 hours: Temp:  [98.9 F (37.2 C)-99.4 F (37.4 C)] 98.9 F (37.2 C) (10/11 0531) Pulse Rate:  [87-98] 87 (10/11 0531) Resp:  [16-18] 18 (10/11 0531) BP: (111-126)/(68-80) 126/68 (10/11 0531) SpO2:  [94 %-95 %] 94 % (10/11 0531)  Intake/Output from previous day: 10/10 0701 - 10/11 0700 In: 970 [P.O.:970] Out: 1550 [Urine:1550] Intake/Output this shift: Total I/O In: -  Out: 550 [Urine:550]  Awake and cooperative. Conversant. MAEW. Neuro exam remains good with full strength all extremities. Reports no pain. BLE edema improved this morning. Lumbar wounds managed by wound therapy.  Lab Results: Recent Labs    04/07/18 0357 04/09/18 0317  WBC 9.1 8.9  HGB 8.1* 7.8*  HCT 27.2* 25.8*  PLT 382 367   BMET Recent Labs    04/07/18 0357  NA 137  K 3.6  CL 97*  CO2 31  GLUCOSE 289*  BUN 8  CREATININE 0.70  CALCIUM 7.8*    Studies/Results: No results found.  Assessment/Plan:   LOS: 20 days  supportive care continues   Georgiann Cocker 04/09/2018, 7:33 AM   [name] continuing to improve.

## 2018-04-10 LAB — GLUCOSE, CAPILLARY
GLUCOSE-CAPILLARY: 231 mg/dL — AB (ref 70–99)
Glucose-Capillary: 244 mg/dL — ABNORMAL HIGH (ref 70–99)
Glucose-Capillary: 159 mg/dL — ABNORMAL HIGH (ref 70–99)
Glucose-Capillary: 166 mg/dL — ABNORMAL HIGH (ref 70–99)

## 2018-04-10 LAB — CBC
HCT: 26.7 % — ABNORMAL LOW (ref 39.0–52.0)
Hemoglobin: 7.8 g/dL — ABNORMAL LOW (ref 13.0–17.0)
MCH: 29 pg (ref 26.0–34.0)
MCHC: 29.2 g/dL — ABNORMAL LOW (ref 30.0–36.0)
MCV: 99.3 fL (ref 80.0–100.0)
NRBC: 0 % (ref 0.0–0.2)
Platelets: 384 10*3/uL (ref 150–400)
RBC: 2.69 MIL/uL — AB (ref 4.22–5.81)
RDW: 13.5 % (ref 11.5–15.5)
WBC: 8.7 10*3/uL (ref 4.0–10.5)

## 2018-04-10 LAB — BASIC METABOLIC PANEL
Anion gap: 8 (ref 5–15)
BUN: 5 mg/dL — AB (ref 6–20)
CALCIUM: 7.9 mg/dL — AB (ref 8.9–10.3)
CO2: 30 mmol/L (ref 22–32)
CREATININE: 0.72 mg/dL (ref 0.61–1.24)
Chloride: 99 mmol/L (ref 98–111)
GFR calc Af Amer: >60 mL/min (ref >60)
GLUCOSE: 245 mg/dL — AB (ref 70–99)
POTASSIUM: 3.6 mmol/L (ref 3.5–5.1)
SODIUM: 137 mmol/L (ref 135–145)

## 2018-04-10 NOTE — Progress Notes (Signed)
Foley Catheter placed. Patient tolerated the procedure well. 1450 ml of urine output documented.  Will continue to monitor the patient.

## 2018-04-10 NOTE — Progress Notes (Signed)
PROGRESS NOTE        PATIENT DETAILS  Name: Tony Long  Age: 42 y.o.  Sex: male Date of Birth: 03-12-1976 Admit Date: 03/20/2018 Admitting Physician Kendell Bane, MD  ZOX:[id number], Rolm Gala, FNP   Brief Narrative:  [name] a 42 y.o. male history of IVDA, DM-2, hypertension admitted for worsening back pain-further evaluation revealed MSSA bacteremia with tricuspid valve endocarditis, epidural abscess involving C2 through sacrum, and numerous soft tissue back abscesses.  Evaluated by general surgery-underwent I&D of her lower back soft tissue abscess, evaluated by neurosurgery-not felt to be a candidate for decompressive surgery due to extensive nature of the disease and lack of any significant neurological findings.  ID following He was initially kept on IV cefazolin and then antibiotic coverage was broadened to Vancomycin and Rocephin on 03/30/2018 by ID.   Currently on IV nafcillin on 04/16/2018, thereafter 4 weeks of Keflex 500 p.o. twice daily per ID.Marland Kitchen   See below for further details   Subjective:  Failed voiding trial Foley reinserted with 1450 ml of urine removed, no fevers no chills  Assessment/Plan:   MSSA Bacteremia with tricuspid valve endocarditis, extensive epidural abscess (C2 through sacrum) and large soft tissue lower back abscess  : due to extensive nature of the epidural abscess and lack of concerning neurological findings-neurosurgery does not recommend decompressive surgery. Seen by ID and current recommendation noted, he was initially kept on IV cefazolin and then antibiotic coverage was broadened to Vancomycin and Rocephin on 03/30/2018 by ID.   Currently on IV nafcillin on 04/16/2018, thereafter 4 weeks of Keflex 500 p.o. twice daily per ID.. D/w Dr Jolayne Haines on 04/09/18, he confirms antibiotic plan above  Given history of IVDA-not a candidate for outpatient IV Abx therapy. He has received a PICC line, continue IV antibiotics through  PICC Line.  Patient doing fairly well,   Patient aware of the extensive nature of this infection-with life-threatening and life disabling risks including quadriplegia along with life-threatening sepsis with this present infection which is nonsurgical.     Cocaine/Substance abuse: Withdrawal symptoms much better-on 9/25 patient developed excessive sedation-Klonopin/Flexeril has been changed to as needed dosing-clonidine being tapered down.  On 03/28/2018 he requested  that he be put back on his Suboxone, he does not want short-acting pain medications anymore,    Polysubstance abuse/IVDA: Counseled to quit.  Anemia.  Normocytic, he had some chronic anemia upon admission as well and now worse due to him dilution from IV fluids also some element of iron deficiency per anemia panel likely due to multiple blood draws.  No signs of ongoing bleeding or acute blood loss, s/p  1 unit of packed RBC on 04/06/2018 and monitor, hgb is > 9  Mild lower extremity leg edema.  ---improving with TED stockings and gentle Lasix on 04/03/2018 & post RBC on 04/06/2018 and monitor.    New onset of left-sided pleuritic chest pain starting on late night 03/29/2018.  Nonspecific EKG changes stable on combination of aspirin, beta-blocker and statin.  Echocardiogram shows no wall motion abnormality and preserved EF, pain is completely resolved.  Hypertension: Monitor on beta-blocker.  Acute urinary retention: Continue Foley and Flomax, trial of Foley removal failed on 04/03/2018, likely combination of lack of activity and spine infection, failed voiding trial again on 04/09/2018, Foley reinserted and 1450 mL of urine removed, ,will need outpatient  follow-up with urology  Severe hypokalemia and hypomagnesemia. -Replace and recheck  DM-2: Poor outpatient control, last A1c 12.6, continue Lantus and sliding scale.  Lantus dose adjusted for better control.   CBG (last 3)  Recent Labs    04/09/18 1704 04/09/18 2135 04/10/18 0812    GLUCAP 351* 311* 231*   Lab Results  Component Value Date   HGBA1C 12.6 (A) 02/26/2018     DVT Prophylaxis: Prophylactic heparin  Code Status: Full code  Disposition Plan: Home after completing IV antibiotics ...04/16/18 or 04/17/18  Antimicrobial agents: Anti-infectives (From admission, onward)   Start     Dose/Rate Route Frequency Ordered Stop   04/01/18 1300  nafcillin 2 g in sodium chloride 0.9 % 100 mL IVPB     2 g 200 mL/hr over 30 Minutes Intravenous Every 4 hours 04/01/18 1242     04/01/18 1200  nafcillin injection 2 g  Status:  Discontinued     2 g Intravenous Every 4 hours 04/01/18 1129 04/01/18 1242   03/31/18 0200  vancomycin (VANCOCIN) IVPB 750 mg/150 ml premix  Status:  Discontinued     750 mg 150 mL/hr over 60 Minutes Intravenous Every 8 hours 03/30/18 1628 04/01/18 1129   03/31/18 0100  meropenem (MERREM) 1 g in sodium chloride 0.9 % 100 mL IVPB  Status:  Discontinued     1 g 200 mL/hr over 30 Minutes Intravenous Every 8 hours 03/30/18 1628 04/01/18 1129   03/30/18 1630  vancomycin (VANCOCIN) 1,500 mg in sodium chloride 0.9 % 500 mL IVPB     1,500 mg 250 mL/hr over 120 Minutes Intravenous NOW 03/30/18 1619 03/30/18 1925   03/30/18 1630  meropenem (MERREM) 2 g in sodium chloride 0.9 % 100 mL IVPB     2 g 200 mL/hr over 30 Minutes Intravenous NOW 03/30/18 1619 03/30/18 1754   03/21/18 0930  ceFAZolin (ANCEF) IVPB 2g/100 mL premix  Status:  Discontinued     2 g 200 mL/hr over 30 Minutes Intravenous Every 8 hours 03/21/18 0920 03/30/18 1616      Procedures:   MRI C, T and L-spine done 03/30/2018 and 03/31/2018.    CT angiogram chest 03/30/2018.  1. No pulmonary embolus. 2. Moderate loculated left pleural effusion. Adjacent airspace disease likely compressive atelectasis, pneumonia not excluded in the setting of spinal infection and endocarditis. Mild right pleural thickening or loculated pleural fluid in the upper right hemithorax.  Repeat echocardiogram  03/30/2018 - Left ventricle: The cavity size was normal. Wall thickness was normal. Systolic function was normal. The estimated ejection fraction was in the range of 60% to 65%. Wall motion was normal; there were no regional wall motion abnormalities. Left ventricular diastolic function parameters were normal.  TEE - - Left ventricle: The cavity size was normal. Wall thickness wasnormal. Systolic function was normal. The estimated ejectionfraction was in the range of 60% to 65%. - Aortic valve: No evidence of vegetation. - Mitral valve: No evidence of vegetation. - Left atrium: No evidence of thrombus in the atrial cavity or appendage. No evidence of thrombus in the appendage. - Right atrium: No evidence of thrombus in the atrial cavity orappendage. - Tricuspid valve: There was a vegetation. There was a small (1.2cmx .6cm), mobile vegetation on the septal leaflet. There was mildregurgitation. - Pulmonic valve: No evidence of vegetation.  Impressions:   Tricuspid valve endocarditis. Discussed with primary team   MRI C-T-L Spine - 1. Epidural abscess from C2 to sacrum as described. Maximal cord compression  from T3-4 to T5-6. The thecal sac is completely effaced from L1 to L4-5. At the L3-4 interspinous space the spinal abscess communicates with large bilateral abscesses within the intrinsic back muscles. There is osteomyelitis of the L2, L3, and L4 spinous processes. No discitis or facet arthritis. 2. Left paravertebral abscess along the lower thoracic spine with small left empyema that is new from CT 2 days ago. 3. Distended bladder  CT - 1. Evidence of cellulitis involving the right side of the base of the neck extending into the right supraclavicular region with fluid and gas in the soft tissues at the base of the right side of the neck. 2. No significant abnormality of the thoracic spine. 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  CT -  1. Extensive abnormal fluid collection in the  subcutaneous fat of the midline of the back with underlying marked abnormality of the posterior paraspinal musculature from L1-2 through S3. This is worrisome for subcutaneous abscess and myositis. 2. Moth-eaten appearance of the spinous processes of L3 and L4 consistent with osteomyelitis. 3. No discrete epidural abscess. However, the abnormal edema in the paraspinal musculature extends to the posterior aspect of the spinal canal at L3-4. 4. MRI with and without contrast may better define the extent of the soft tissue and infection and could detect epidural extension that is not apparent on this unenhanced CT scan.  9/22>> PICC line  I&D by [facility] of back soft tissue abscess on -  03/22/18  CONSULTS:  N.Surgery, ID, general surgery, PCCM    MEDICATIONS:  Scheduled Meds:  . aspirin  81 mg Oral Daily  . atorvastatin  40 mg Oral q1800  . buprenorphine-naloxone  2 tablet Sublingual Daily  . feeding supplement (PRO-STAT SUGAR FREE 64)  30 mL Oral TID WC  . gabapentin  300 mg Oral TID  . heparin injection (subcutaneous)  5,000 Units Subcutaneous Q8H  . insulin aspart  0-5 Units Subcutaneous QHS  . insulin aspart  0-9 Units Subcutaneous TID WC  . insulin aspart  4 Units Subcutaneous TID WC  . insulin glargine  25 Units Subcutaneous QHS  . metoprolol tartrate  50 mg Oral BID  . nicotine  21 mg Transdermal Daily  . polyethylene glycol  17 g Oral BID  . senna-docusate  2 tablet Oral QHS  . tamsulosin  0.4 mg Oral BID   Continuous Infusions: . nafcillin IV 2 g (04/10/18 0832)   PRN Meds:.acetaminophen, bisacodyl, clonazepam, cyclobenzaprine, hydrALAZINE, LORazepam, magnesium hydroxide, metoprolol tartrate, nitroGLYCERIN, sodium chloride flush, sodium phosphate   PHYSICAL EXAM: Vital signs: Vitals:   04/09/18 0531 04/09/18 1854 04/09/18 2133 04/10/18 0454  BP: 126/68 (!) 103/58 114/70 116/62  Pulse: 87 97 95 83  Resp: 18 16 16 18   Temp: 98.9 F (37.2 C) 100.3 F (37.9 C) 98 F (36.7  C) 99.3 F (37.4 C)  TempSrc: Oral Oral Oral Oral  SpO2: 94% 94% 96% 95%  Weight:      Height:       Filed Weights   03/21/18 0602  Weight: 68 kg   Body mass index is 20.92 kg/m.   Exam  Physical Exam  [name] examined daily including today on 04/10/18 , exams remain the same as of yesterday except that has changed   Gen:- Awake Alert, in no acute distress HEENT:- [place].AT, No sclera icterus Neck-Supple Neck,No JVD,.  Lungs-  CTAB , fairly symmetrical air movement CV- S1, S2 normal, regular Abd-  +ve B.Sounds, Abd Soft, No tenderness,  Extremity/Skin:-+1 to +2 ankle edema bilaterally, right arm PICC line site is clean dry and intact Psych-affect is appropriate, oriented x3 Neuro-L Leg 5/5 GU- Foley with clear urine    LABORATORY DATA: CBC: Recent Labs  Lab 04/05/18 0754 04/05/18 1133 04/06/18 0355 04/07/18 0357 04/09/18 0317 04/10/18 0812  WBC 10.2  --  10.6* 9.1 8.9 8.7  HGB 6.7* 7.3* 7.2* 8.1* 7.8* 7.8*  HCT 22.2* 23.3* 23.8* 27.2* 25.8* 26.7*  MCV 98.7  --  98.8 97.1 97.7 99.3  PLT 306  --  328 382 367 384    Basic Metabolic Panel: Recent Labs  Lab 04/04/18 0634 04/05/18 0754 04/06/18 0355 04/07/18 0357 04/10/18 0812  NA 138 136 136 137 137  K 3.0* 2.4* 3.9 3.6 3.6  CL 100 98 96* 97* 99  CO2 32 33* 34* 31 30  GLUCOSE 258* 169* 210* 289* 245*  BUN 10 <5* 6 8 5*  CREATININE 0.66 0.49* 0.61 0.70 0.72  CALCIUM 7.7* 6.6* 7.5* 7.8* 7.9*  MG 1.6* 1.5* 1.9 1.7  --     GFR: Estimated Creatinine Clearance: 116.9 mL/min (by C-G formula based on SCr of 0.72 mg/dL).  Liver Function Tests: No results for input(s): AST, ALT, ALKPHOS, BILITOT, PROT, ALBUMIN in the last 168 hours. No results for input(s): LIPASE, AMYLASE in the last 168 hours. No results for input(s): AMMONIA in the last 168 hours.  Coagulation Profile: No results for input(s): INR, PROTIME in the last 168 hours.  Cardiac Enzymes: No results for input(s): CKTOTAL, CKMB, CKMBINDEX,  TROPONINI in the last 168 hours.  BNP (last 3 results) No results for input(s): PROBNP in the last 8760 hours.  HbA1C: No results for input(s): HGBA1C in the last 72 hours.  CBG: Recent Labs  Lab 04/09/18 0806 04/09/18 1208 04/09/18 1704 04/09/18 2135 04/10/18 0812  GLUCAP 235* 199* 351* 311* 231*    Lipid Profile: No results for input(s): CHOL, HDL, LDLCALC, TRIG, CHOLHDL, LDLDIRECT in the last 72 hours.  Thyroid Function Tests: No results for input(s): TSH, T4TOTAL, FREET4, T3FREE, THYROIDAB in the last 72 hours.  Anemia Panel: No results for input(s): VITAMINB12, FOLATE, FERRITIN, TIBC, IRON, RETICCTPCT in the last 72 hours.  Urine analysis:    Component Value Date/Time   COLORURINE YELLOW 03/30/2018 1930   APPEARANCEUR CLEAR 03/30/2018 1930   LABSPEC 1.009 03/30/2018 1930   PHURINE 6.0 03/30/2018 1930   GLUCOSEU 50 (A) 03/30/2018 1930   HGBUR SMALL (A) 03/30/2018 1930   BILIRUBINUR NEGATIVE 03/30/2018 1930   BILIRUBINUR small 02/26/2018 1202   KETONESUR NEGATIVE 03/30/2018 1930   PROTEINUR NEGATIVE 03/30/2018 1930   UROBILINOGEN 1.0 02/26/2018 1202   UROBILINOGEN 1.0 12/11/2016 1020   NITRITE NEGATIVE 03/30/2018 1930   LEUKOCYTESUR TRACE (A) 03/30/2018 1930    Sepsis Labs: Lactic Acid, Venous    Component Value Date/Time   LATICACIDVEN 1.2 03/30/2018 1837    MICROBIOLOGY: Recent Results (from the past 240 hour(s))  Culture, blood (Routine X 2) w Reflex to ID Panel     Status: None   Collection Time: 04/01/18  9:49 AM  Result Value Ref Range Status   Specimen Description BLOOD BLOOD RIGHT HAND  Final   Special Requests   Final    BOTTLES DRAWN AEROBIC ONLY Blood Culture results may not be optimal due to an inadequate volume of blood received in culture bottles   Culture   Final    NO GROWTH 5 DAYS Performed at [facility], [address],  [place], [place] [address]    Report Status 04/06/2018 FINAL  Final  Culture, blood (Routine X 2) w  Reflex to ID Panel     Status: None   Collection Time: 04/01/18 10:02 AM  Result Value Ref Range Status   Specimen Description BLOOD RIGHT ANTECUBITAL  Final   Special Requests   Final    BOTTLES DRAWN AEROBIC ONLY Blood Culture adequate volume   Culture   Final    NO GROWTH 5 DAYS Performed at [name], [address] [place], [place] [address]    Report Status 04/06/2018 FINAL  Final    RADIOLOGY STUDIES/RESULTS:  Ct Angio Chest Pe W Or Wo Contrast  Result Date: 03/30/2018 CLINICAL DATA:  Shortness of breath PE suspected, high pretest prob. Patient with known epidural abscess and endocarditis. Now with left-sided chest pain. EXAM: CT ANGIOGRAPHY CHEST WITH CONTRAST TECHNIQUE: Multidetector CT imaging of the chest was performed using the standard protocol during bolus administration of intravenous contrast. Multiplanar CT image reconstructions and MIPs were obtained to evaluate the vascular anatomy. CONTRAST:  54mL ISOVUE-370 IOPAMIDOL (ISOVUE-370) INJECTION 76% COMPARISON:  Chest radiographs earlier this day. FINDINGS: Cardiovascular: There are no filling defects within the pulmonary arteries to suggest pulmonary embolus. The thoracic aorta is normal in caliber without dissection. Mild cardiomegaly. Small amount pericardial fluid. Mediastinum/Nodes: Loculated left pleural effusion tracks along the left aspect of the mediastinum. Small left hilar nodes without bulky adenopathy. No enlarged mediastinal nodes. No dominant thyroid nodule. The esophagus is decompressed. Lungs/Pleura: Moderate left pleural effusion is loculated and tracks along the posterior, lateral, and medial hemithorax. Adjacent airspace disease in the left lower lobe, favoring compressive atelectasis, with adjacent compressive atelectasis in the right upper lobe. Pleural thickening versus loculated pleural effusion the upper medial right hemithorax, with additional areas of pleural thickening dependently. Dependent  atelectasis in the right lower lobe. Trachea and bronchi are patent. Upper Abdomen: No acute findings. Musculoskeletal: Patient with known epidural abscess, not well delineated by CT. No bony destructive change. Review of the MIP images confirms the above findings. IMPRESSION: 1. No pulmonary embolus. 2. Moderate loculated left pleural effusion. Adjacent airspace disease likely compressive atelectasis, pneumonia not excluded in the setting of spinal infection and endocarditis. Mild right pleural thickening or loculated pleural fluid in the upper right hemithorax. Electronically Signed   By: Narda Rutherford M.D.   On: 03/30/2018 23:21   Ct Thoracic Spine Wo Contrast  Result Date: 03/20/2018 CLINICAL DATA:  Progressive back pain.  Swelling of the lower back. EXAM: CT THORACIC SPINE WITHOUT CONTRAST TECHNIQUE: Multidetector CT images of the thoracic were obtained using the standard protocol without intravenous contrast. COMPARISON:  None. FINDINGS: Alignment: Normal. 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 Paraspinal and other soft tissues: There is abnormal gas and fluid in the soft tissues of the right side of the base of the neck and in the right supraclavicular region. Paraspinal soft tissues appear normal throughout the thoracic spine. Disc levels: There is no evidence of disc protrusion or significant disc bulging or spinal or foraminal stenosis or other significant abnormality of the thoracic spine. IMPRESSION: 1. Evidence of cellulitis involving the right side of the base of the neck extending into the right supraclavicular region with fluid and gas in the soft tissues at the base of the right side of the neck. 2. No significant abnormality of the thoracic spine. 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 Electronically Signed   By: Francene Boyers M.D.   On: 03/20/2018  11:25   Ct Lumbar Spine Wo Contrast  Addendum Date: 03/20/2018   ADDENDUM REPORT: 03/20/2018 11:44 ADDENDUM: Critical  Value/emergent results were called by telephone at the time of interpretation on 03/20/2018 at 11:30 am to Dr. Sharyn Creamer , who verbally acknowledged these results. Electronically Signed   By: Francene Boyers M.D.   On: 03/20/2018 11:44   Result Date: 03/20/2018 CLINICAL DATA:  Increasing low back pain and soft tissue swelling. EXAM: CT LUMBAR SPINE WITHOUT CONTRAST TECHNIQUE: Multidetector CT imaging of the lumbar spine was performed without intravenous contrast administration. Multiplanar CT image reconstructions were also generated. IV contrast could not be utilized due to the lack of an appropriate IV. COMPARISON:  None. FINDINGS: Segmentation: 5 lumbar type vertebrae. Alignment: Normal. Vertebrae: There is a moth-eaten appearance of the spinous processes of L3 and L4 which is worrisome for osteomyelitis. Bilateral pars defects at L5 with grade 1 spondylolisthesis. 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 Paraspinal and other soft tissues: There is an extensive abnormal fluid collection in the subcutaneous soft tissues of the posterior aspect of the back extending from approximately L1-2 to S3. This fluid collection is lobulated and measures approximately 20 x 9 x 2.5 cm. It is centered slightly to the left of midline and has a mass effect upon the adjacent posterior paraspinal muscles. There is abnormal lucency in the underlying paraspinal muscles which could represent myositis. Disc levels: T11-12: No significant abnormality. Butterfly T12 vertebra. T12-L1: No significant abnormality. L1-2: Normal disc. Abnormal edema in the posterior paraspinal musculature with adjacent fluid collection in the subcutaneous fat of the posterior aspect of the back as described above. L2-3: Normal disc. L3-4: Normal disc. Lucency in the posterior paraspinal soft tissues extends to the posterior aspect of the thecal sac on image 80 of series 4 but there is no discrete epidural abscess. L4-5: Normal disc.  No evidence of epidural  abscess. L5-S1: Grade 1 spondylolisthesis. No disc bulging or protrusion. Bilateral pars defects. No visible epidural abscess. IMPRESSION: 1. Extensive abnormal fluid collection in the subcutaneous fat of the midline of the back with underlying marked abnormality of the posterior paraspinal musculature from L1-2 through S3. This is worrisome for subcutaneous abscess and myositis. 2. Moth-eaten appearance of the spinous processes of L3 and L4 consistent with osteomyelitis. 3. No discrete epidural abscess. However, the abnormal edema in the paraspinal musculature extends to the posterior aspect of the spinal canal at L3-4. 4. MRI with and without contrast may better define the extent of the soft tissue and infection and could detect epidural extension that is not apparent on this unenhanced CT scan. Electronically Signed: By: Francene Boyers M.D. On: 03/20/2018 11:18   Mr Cervical Spine Wo Contrast  Result Date: 03/30/2018 CLINICAL DATA:  42 year old male with history of MSSA bacteremia with known endocarditis and epidural abscess extending from C2 through the sacrum. Recent episode of hypotension and acute lower extremity weakness. Evaluate abscess and possible spinal cord infarct. EXAM: MRI CERVICAL, THORACIC SPINE WITHOUT CONTRAST TECHNIQUE: Multiplanar and multiecho pulse sequences of the cervical spine, to include the craniocervical junction and cervicothoracic junction, and thoracic and lumbar spine, were obtained without intravenous contrast. COMPARISON:  Prior MRI from 03/22/2018. FINDINGS: MRI CERVICAL SPINE FINDINGS Alignment: Examination technically limited as the patient was unable to tolerate the full length of the exam. Additionally, images provided are degraded by motion artifact. Reversal of the normal cervical lordosis with apex at C5, stable. No interval listhesis or malalignment. Vertebrae: Vertebral body height maintained without acute  or interval fracture. Bone marrow signal intensity diffusely  decreased on T1 weighted imaging, suspected to be related to anemia and chronic disease. No discrete osseous lesions. No abnormal marrow edema. No evidence for interval discitis. Cord: Signal intensity within the cervical spinal cord is within normal limits. No findings to suggest interval cord infarction on this motion degraded and limited exam. Previously identified epidural collection involving the ventral epidural space extending from C2 inferiorly is decreased in size, now measuring up to 7 mm in maximal AP diameter at the level of C2. Collection diffusely involves the ventral epidural space, but also is seen dorsally as well. Slightly improved diffuse spinal stenosis with thecal sac patency. Posterior Fossa, vertebral arteries, paraspinal tissues: Visualized brain and posterior fossa within normal limits. Craniocervical junction normal. Scattered edema within the posterior paraspinous soft tissues. Normal intravascular flow voids seen within the vertebral arteries bilaterally. Disc levels: C2-C3: Unremarkable. C3-C4: Small left foraminal protrusion with associated moderate left C4 foraminal stenosis. C4-C5:  Unremarkable. C5-C6: Left eccentric disc bulge with uncovertebral hypertrophy. Moderate left C6 foraminal narrowing. C6-C7: Right foraminal disc protrusion with associated moderate right C7 foraminal stenosis. C7-T1:  Unremarkable. MRI THORACIC SPINE FINDINGS Alignment: Examination markedly limited as the patient was unable to tolerate the full length of the exam. Sagittal T1, T2, and STIR sequences only were performed. No axial images obtained. Vertebral bodies normally aligned with preservation of the normal thoracic kyphosis. Vertebrae: Vertebral body height maintained without evidence for interval fracture. Butterfly vertebra noted at T12. Diffusely decreased T1 weighted signal intensity throughout the visualized bone marrow, like related to anemia chronic disease. No findings to suggest interval or  new discitis or septic arthritis. Cord: Signal intensity within the thoracic spinal cord grossly within normal limits on these limited sagittal views. No definite cord signal abnormality or cord edema to suggest acute spinal cord infarction. Previously seen diffuse epidural collection appears overall decreased in size from previous, with improved spinal stenosis and thecal sac patency. Paraspinal and other soft tissues: Scattered edema seen within the posterior paraspinous soft tissues at the upper back/cervicothoracic junction. No discrete soft tissue collections. Disc levels: T5-6: Central disc protrusion indenting upon the ventral thoracic spinal cord, stable. IMPRESSION: 1. Technically limited exam due to motion artifact and the patient's inability to tolerate the full length of the exam. Cervical and thoracic spine only was imaged, and only sagittal sequences of the thoracic spine were obtained. 2. No imaging findings to suggest spinal cord infarction identified on this limited exam. 3. Interval improvement in diffuse epidural collection, decreased in size as compared to previous exam with improved diffuse spinal stenosis and thecal sac patency. No new discitis or facet arthritis. Electronically Signed   By: Rise Mu M.D.   On: 03/30/2018 22:52   Mr Thoracic Spine Wo Contrast  Result Date: 03/30/2018 CLINICAL DATA:  42 year old male with history of MSSA bacteremia with known endocarditis and epidural abscess extending from C2 through the sacrum. Recent episode of hypotension and acute lower extremity weakness. Evaluate abscess and possible spinal cord infarct. EXAM: MRI CERVICAL, THORACIC SPINE WITHOUT CONTRAST TECHNIQUE: Multiplanar and multiecho pulse sequences of the cervical spine, to include the craniocervical junction and cervicothoracic junction, and thoracic and lumbar spine, were obtained without intravenous contrast. COMPARISON:  Prior MRI from 03/22/2018. FINDINGS: MRI CERVICAL SPINE  FINDINGS Alignment: Examination technically limited as the patient was unable to tolerate the full length of the exam. Additionally, images provided are degraded by motion artifact. Reversal of the normal cervical lordosis  with apex at C5, stable. No interval listhesis or malalignment. Vertebrae: Vertebral body height maintained without acute or interval fracture. Bone marrow signal intensity diffusely decreased on T1 weighted imaging, suspected to be related to anemia and chronic disease. No discrete osseous lesions. No abnormal marrow edema. No evidence for interval discitis. Cord: Signal intensity within the cervical spinal cord is within normal limits. No findings to suggest interval cord infarction on this motion degraded and limited exam. Previously identified epidural collection involving the ventral epidural space extending from C2 inferiorly is decreased in size, now measuring up to 7 mm in maximal AP diameter at the level of C2. Collection diffusely involves the ventral epidural space, but also is seen dorsally as well. Slightly improved diffuse spinal stenosis with thecal sac patency. Posterior Fossa, vertebral arteries, paraspinal tissues: Visualized brain and posterior fossa within normal limits. Craniocervical junction normal. Scattered edema within the posterior paraspinous soft tissues. Normal intravascular flow voids seen within the vertebral arteries bilaterally. Disc levels: C2-C3: Unremarkable. C3-C4: Small left foraminal protrusion with associated moderate left C4 foraminal stenosis. C4-C5:  Unremarkable. C5-C6: Left eccentric disc bulge with uncovertebral hypertrophy. Moderate left C6 foraminal narrowing. C6-C7: Right foraminal disc protrusion with associated moderate right C7 foraminal stenosis. C7-T1:  Unremarkable. MRI THORACIC SPINE FINDINGS Alignment: Examination markedly limited as the patient was unable to tolerate the full length of the exam. Sagittal T1, T2, and STIR sequences only  were performed. No axial images obtained. Vertebral bodies normally aligned with preservation of the normal thoracic kyphosis. Vertebrae: Vertebral body height maintained without evidence for interval fracture. Butterfly vertebra noted at T12. Diffusely decreased T1 weighted signal intensity throughout the visualized bone marrow, like related to anemia chronic disease. No findings to suggest interval or new discitis or septic arthritis. Cord: Signal intensity within the thoracic spinal cord grossly within normal limits on these limited sagittal views. No definite cord signal abnormality or cord edema to suggest acute spinal cord infarction. Previously seen diffuse epidural collection appears overall decreased in size from previous, with improved spinal stenosis and thecal sac patency. Paraspinal and other soft tissues: Scattered edema seen within the posterior paraspinous soft tissues at the upper back/cervicothoracic junction. No discrete soft tissue collections. Disc levels: T5-6: Central disc protrusion indenting upon the ventral thoracic spinal cord, stable. IMPRESSION: 1. Technically limited exam due to motion artifact and the patient's inability to tolerate the full length of the exam. Cervical and thoracic spine only was imaged, and only sagittal sequences of the thoracic spine were obtained. 2. No imaging findings to suggest spinal cord infarction identified on this limited exam. 3. Interval improvement in diffuse epidural collection, decreased in size as compared to previous exam with improved diffuse spinal stenosis and thecal sac patency. No new discitis or facet arthritis. Electronically Signed   By: Rise Mu M.D.   On: 03/30/2018 22:52   Mr Cervical Spine W Wo Contrast  Result Date: 03/22/2018 CLINICAL DATA:  Spine infection. EXAM: MRI TOTAL SPINE WITHOUT AND WITH CONTRAST TECHNIQUE: Multisequence MR imaging of the spine from the cervical spine to the sacrum was performed prior to and  following IV contrast administration. CONTRAST:  6 cc Gadavist intravenous COMPARISON:  CT of the thoracic and lumbar spine from 2 days ago FINDINGS: MRI CERVICAL SPINE FINDINGS Alignment: Normal Vertebrae: No evidence of osseous infection. Canal/Cord: There is extensive spinal fluid collection preferentially in the ventral but also in the right more than left dorsal canal. Subarachnoid space is diffusely effaced. Maximal thickness is posterior  to C2 at 9 mm. No cord signal abnormality. Posterior Fossa, vertebral arteries, paraspinal tissues: No retropharyngeal or other discrete soft tissue collection. Disc levels: C2-3: Unremarkable. C3-4: Small left foraminal protrusion with moderate narrowing C4-5: Unremarkable. C5-6: Disc narrowing and bulging with asymmetric left uncovertebral spurring. Left foraminal impingement C6-7: Right foraminal protrusion mild narrowing. C7-T1:Unremarkable. MRI THORACIC SPINE FINDINGS Alignment:  Normal Vertebrae: No evidence of osteomyelitis or discitis. T12 butterfly vertebra. Canal/Cord: Cervical ventral epidural collection continues throughout the thoracic levels. There is also a focal dorsal component at T3-4 to T5-6, where thecal sac effacement is accentuated and there is cord flattening. No cord edema. Paraspinal and other soft tissues: Paraspinous phlegmon on the left at T8-T12, with new complex left pleural effusion and lower lobe atelectasis Disc levels: T5-6 central disc protrusion. MRI LUMBAR SPINE FINDINGS Segmentation:  5 lumbar type vertebral bodies Alignment:  Grade 1 anterolisthesis at L5-S1. Vertebrae: Marrow edema and heterogeneous enhancement within the L2, L3, and L4 spinous processes. No discitis or facet edema. Conus medullaris: Extends to the L1 level and is non edematous. There is extensive epidural collection completely effacing the thecal sac throughout the lumbar spine until L4-5 and below where the collection becomes ventral and right eccentric. The infection  communicates with extensive bilateral abscess within the intrinsic back muscles via the interspinous space at L3-4. Patient had recent subcutaneous collection and left buttocks collection drainage, with packing seen in place. This midline, upper subcutaneous collection communicates with the paravertebral abscess along its superior margin based on postcontrast axial images. Paraspinal and other soft tissues: As above.  Distended bladder Disc levels: Chronic bilateral pars defects at L5. Critical Value/emergent results were called by telephone at the time of interpretation on 03/22/2018 at 3:04 pm to Dr. Thedore Mins , who verbally acknowledged these results. IMPRESSION: 1. Epidural abscess from C2 to sacrum as described. Maximal cord compression from T3-4 to T5-6. The thecal sac is completely effaced from L1 to L4-5. At the L3-4 interspinous space the spinal abscess communicates with large bilateral abscesses within the intrinsic back muscles. There is osteomyelitis of the L2, L3, and L4 spinous processes. No discitis or facet arthritis. 2. Left paravertebral abscess along the lower thoracic spine with small left empyema that is new from CT 2 days ago. 3. Distended bladder Electronically Signed   By: Marnee Spring M.D.   On: 03/22/2018 15:11   Mr Thoracic Spine W Wo Contrast  Result Date: 03/22/2018 CLINICAL DATA:  Spine infection. EXAM: MRI TOTAL SPINE WITHOUT AND WITH CONTRAST TECHNIQUE: Multisequence MR imaging of the spine from the cervical spine to the sacrum was performed prior to and following IV contrast administration. CONTRAST:  6 cc Gadavist intravenous COMPARISON:  CT of the thoracic and lumbar spine from 2 days ago FINDINGS: MRI CERVICAL SPINE FINDINGS Alignment: Normal Vertebrae: No evidence of osseous infection. Canal/Cord: There is extensive spinal fluid collection preferentially in the ventral but also in the right more than left dorsal canal. Subarachnoid space is diffusely effaced. Maximal thickness  is posterior to C2 at 9 mm. No cord signal abnormality. Posterior Fossa, vertebral arteries, paraspinal tissues: No retropharyngeal or other discrete soft tissue collection. Disc levels: C2-3: Unremarkable. C3-4: Small left foraminal protrusion with moderate narrowing C4-5: Unremarkable. C5-6: Disc narrowing and bulging with asymmetric left uncovertebral spurring. Left foraminal impingement C6-7: Right foraminal protrusion mild narrowing. C7-T1:Unremarkable. MRI THORACIC SPINE FINDINGS Alignment:  Normal Vertebrae: No evidence of osteomyelitis or discitis. T12 butterfly vertebra. Canal/Cord: Cervical ventral epidural collection continues throughout the  thoracic levels. There is also a focal dorsal component at T3-4 to T5-6, where thecal sac effacement is accentuated and there is cord flattening. No cord edema. Paraspinal and other soft tissues: Paraspinous phlegmon on the left at T8-T12, with new complex left pleural effusion and lower lobe atelectasis Disc levels: T5-6 central disc protrusion. MRI LUMBAR SPINE FINDINGS Segmentation:  5 lumbar type vertebral bodies Alignment:  Grade 1 anterolisthesis at L5-S1. Vertebrae: Marrow edema and heterogeneous enhancement within the L2, L3, and L4 spinous processes. No discitis or facet edema. Conus medullaris: Extends to the L1 level and is non edematous. There is extensive epidural collection completely effacing the thecal sac throughout the lumbar spine until L4-5 and below where the collection becomes ventral and right eccentric. The infection communicates with extensive bilateral abscess within the intrinsic back muscles via the interspinous space at L3-4. Patient had recent subcutaneous collection and left buttocks collection drainage, with packing seen in place. This midline, upper subcutaneous collection communicates with the paravertebral abscess along its superior margin based on postcontrast axial images. Paraspinal and other soft tissues: As above.  Distended  bladder Disc levels: Chronic bilateral pars defects at L5. Critical Value/emergent results were called by telephone at the time of interpretation on 03/22/2018 at 3:04 pm to Dr. Thedore Mins , who verbally acknowledged these results. IMPRESSION: 1. Epidural abscess from C2 to sacrum as described. Maximal cord compression from T3-4 to T5-6. The thecal sac is completely effaced from L1 to L4-5. At the L3-4 interspinous space the spinal abscess communicates with large bilateral abscesses within the intrinsic back muscles. There is osteomyelitis of the L2, L3, and L4 spinous processes. No discitis or facet arthritis. 2. Left paravertebral abscess along the lower thoracic spine with small left empyema that is new from CT 2 days ago. 3. Distended bladder Electronically Signed   By: Marnee Spring M.D.   On: 03/22/2018 15:11   Mr Lumbar Spine W Wo Contrast  Result Date: 03/22/2018 CLINICAL DATA:  Spine infection. EXAM: MRI TOTAL SPINE WITHOUT AND WITH CONTRAST TECHNIQUE: Multisequence MR imaging of the spine from the cervical spine to the sacrum was performed prior to and following IV contrast administration. CONTRAST:  6 cc Gadavist intravenous COMPARISON:  CT of the thoracic and lumbar spine from 2 days ago FINDINGS: MRI CERVICAL SPINE FINDINGS Alignment: Normal Vertebrae: No evidence of osseous infection. Canal/Cord: There is extensive spinal fluid collection preferentially in the ventral but also in the right more than left dorsal canal. Subarachnoid space is diffusely effaced. Maximal thickness is posterior to C2 at 9 mm. No cord signal abnormality. Posterior Fossa, vertebral arteries, paraspinal tissues: No retropharyngeal or other discrete soft tissue collection. Disc levels: C2-3: Unremarkable. C3-4: Small left foraminal protrusion with moderate narrowing C4-5: Unremarkable. C5-6: Disc narrowing and bulging with asymmetric left uncovertebral spurring. Left foraminal impingement C6-7: Right foraminal protrusion mild  narrowing. C7-T1:Unremarkable. MRI THORACIC SPINE FINDINGS Alignment:  Normal Vertebrae: No evidence of osteomyelitis or discitis. T12 butterfly vertebra. Canal/Cord: Cervical ventral epidural collection continues throughout the thoracic levels. There is also a focal dorsal component at T3-4 to T5-6, where thecal sac effacement is accentuated and there is cord flattening. No cord edema. Paraspinal and other soft tissues: Paraspinous phlegmon on the left at T8-T12, with new complex left pleural effusion and lower lobe atelectasis Disc levels: T5-6 central disc protrusion. MRI LUMBAR SPINE FINDINGS Segmentation:  5 lumbar type vertebral bodies Alignment:  Grade 1 anterolisthesis at L5-S1. Vertebrae: Marrow edema and heterogeneous enhancement within the L2,  L3, and L4 spinous processes. No discitis or facet edema. Conus medullaris: Extends to the L1 level and is non edematous. There is extensive epidural collection completely effacing the thecal sac throughout the lumbar spine until L4-5 and below where the collection becomes ventral and right eccentric. The infection communicates with extensive bilateral abscess within the intrinsic back muscles via the interspinous space at L3-4. Patient had recent subcutaneous collection and left buttocks collection drainage, with packing seen in place. This midline, upper subcutaneous collection communicates with the paravertebral abscess along its superior margin based on postcontrast axial images. Paraspinal and other soft tissues: As above.  Distended bladder Disc levels: Chronic bilateral pars defects at L5. Critical Value/emergent results were called by telephone at the time of interpretation on 03/22/2018 at 3:04 pm to Dr. Thedore Mins , who verbally acknowledged these results. IMPRESSION: 1. Epidural abscess from C2 to sacrum as described. Maximal cord compression from T3-4 to T5-6. The thecal sac is completely effaced from L1 to L4-5. At the L3-4 interspinous space the spinal  abscess communicates with large bilateral abscesses within the intrinsic back muscles. There is osteomyelitis of the L2, L3, and L4 spinous processes. No discitis or facet arthritis. 2. Left paravertebral abscess along the lower thoracic spine with small left empyema that is new from CT 2 days ago. 3. Distended bladder Electronically Signed   By: Marnee Spring M.D.   On: 03/22/2018 15:11   Dg Chest Port 1 View  Result Date: 03/30/2018 CLINICAL DATA:  Shortness of breath EXAM: PORTABLE CHEST 1 VIEW COMPARISON:  03/30/2018 at 0227 hours FINDINGS: Moderate layering left pleural effusion, increased. Left lower lobe opacity, atelectasis versus pneumonia. Right lung is clear.  No pneumothorax. The heart is normal in size. IMPRESSION: Moderate layering left pleural effusion, increased. Left lower lobe opacity, atelectasis versus pneumonia. Electronically Signed   By: Charline Bills M.D.   On: 03/30/2018 19:06   Dg Chest Port 1 View  Result Date: 03/30/2018 CLINICAL DATA:  Chest pain EXAM: PORTABLE CHEST 1 VIEW COMPARISON:  None. FINDINGS: Retrocardiac opacity, atelectasis versus pneumonia. Possible small left pleural effusion. Right lung is clear. No pneumothorax. The heart is normal in size. IMPRESSION: Retrocardiac opacity, atelectasis versus pneumonia. Possible small left pleural effusion. Electronically Signed   By: Charline Bills M.D.   On: 03/30/2018 02:49   [place] Ekg Site Rite  Result Date: 04/04/2018 If Site Rite image not attached, placement could not be confirmed due to current cardiac rhythm.  [place] Ekg Site Rite  Result Date: 03/21/2018 If Site Rite image not attached, placement could not be confirmed due to current cardiac rhythm.    LOS: 21 days   Signature  Shon Hale M.D on 04/10/2018 at 11:57 AM  To page go to [email or web address] - password [name]

## 2018-04-10 NOTE — Progress Notes (Signed)
Assumed care of patient from [place], [place]. Patient resting in bed with family at bedside. Denies any needs at this time.

## 2018-04-11 LAB — GLUCOSE, CAPILLARY
GLUCOSE-CAPILLARY: 279 mg/dL — AB (ref 70–99)
Glucose-Capillary: 123 mg/dL — ABNORMAL HIGH (ref 70–99)
Glucose-Capillary: 119 mg/dL — ABNORMAL HIGH (ref 70–99)
Glucose-Capillary: 258 mg/dL — ABNORMAL HIGH (ref 70–99)

## 2018-04-11 NOTE — Progress Notes (Signed)
PROGRESS NOTE        PATIENT DETAILS  Name: Tony Long  Age: 42 y.o.  Sex: male Date of Birth: 01/12/1976 Admit Date: 03/20/2018 Admitting Physician Kendell Bane, MD  WUJ:[id number], Rolm Gala, FNP   Brief Narrative:  [name] a 42 y.o. male history of IVDA, DM-2, hypertension admitted for worsening back pain-further evaluation revealed MSSA bacteremia with tricuspid valve endocarditis, epidural abscess involving C2 through sacrum, and numerous soft tissue back abscesses.  Evaluated by general surgery-underwent I&D of her lower back soft tissue abscess, evaluated by neurosurgery-not felt to be a candidate for decompressive surgery due to extensive nature of the disease and lack of any significant neurological findings.  ID following He was initially kept on IV cefazolin and then antibiotic coverage was broadened to Vancomycin and Rocephin on 03/30/2018 by ID.   Currently on IV nafcillin on 04/16/2018, thereafter 4 weeks of Keflex 500 p.o. twice daily per ID.Marland Kitchen   See below for further details   Subjective:  No new concerns, eating and drinking well, Foley catheter draining okay  Assessment/Plan:   MSSA Bacteremia with tricuspid valve endocarditis, extensive epidural abscess (C2 through sacrum) and large soft tissue lower back abscess  : due to extensive nature of the epidural abscess and lack of concerning neurological findings-neurosurgery does not recommend decompressive surgery. Seen by ID and current recommendation noted, he was initially kept on IV cefazolin and then antibiotic coverage was broadened to Vancomycin and Rocephin on 03/30/2018 by ID.   Currently on IV nafcillin on 04/16/2018, thereafter 4 weeks of Keflex 500 p.o. twice daily per ID.. D/w Dr Jolayne Haines on 04/09/18, he confirms antibiotic plan above  Given history of IVDA-not a candidate for outpatient IV Abx therapy. He has received a PICC line, continue IV antibiotics through PICC Line.  Patient  doing fairly well,   Patient aware of the extensive nature of this infection-with life-threatening and life disabling risks including quadriplegia along with life-threatening sepsis with this present infection which is nonsurgical.     Cocaine/Substance abuse: Withdrawal symptoms much better-on 9/25 patient developed excessive sedation-Klonopin/Flexeril has been changed to as needed dosing-clonidine being tapered down.  On 03/28/2018 he requested  that he be put back on his Suboxone, he does not want short-acting pain medications anymore,    Polysubstance abuse/IVDA: Counseled to quit.  Anemia.  Normocytic, he had some chronic anemia upon admission as well and now worse due to him dilution from IV fluids also some element of iron deficiency per anemia panel likely due to multiple blood draws.  No signs of ongoing bleeding or acute blood loss, s/p  1 unit of packed RBC on 04/06/2018 and monitor, hgb is > 9  Mild lower extremity leg edema.  ---improving with TED stockings and gentle Lasix on 04/03/2018 & post RBC on 04/06/2018 and monitor.    New onset of left-sided pleuritic chest pain starting on late night 03/29/2018.  Nonspecific EKG changes stable on combination of aspirin, beta-blocker and statin.  Echocardiogram shows no wall motion abnormality and preserved EF, pain is completely resolved.  Hypertension: Monitor on beta-blocker.  Acute urinary retention: Continue Foley and Flomax, trial of Foley removal failed on 04/03/2018, likely combination of lack of activity and spine infection, failed voiding trial again on 04/09/2018, Foley reinserted and 1450 mL of urine removed, ,will need outpatient follow-up with urology discharge  Severe hypokalemia and hypomagnesemia. -Replace and recheck  DM-2: Poor outpatient control, last A1c 12.6, continue Lantus and sliding scale.  Lantus dose adjusted for better control.   CBG (last 3)  Recent Labs    04/10/18 1656 04/10/18 2153 04/11/18 0806  GLUCAP  159* 166* 123*   Lab Results  Component Value Date   HGBA1C 12.6 (A) 02/26/2018     DVT Prophylaxis: Prophylactic heparin  Code Status: Full code  Disposition Plan: Home after completing IV antibiotics ...04/16/18 or 04/17/18  Antimicrobial agents: Anti-infectives (From admission, onward)   Start     Dose/Rate Route Frequency Ordered Stop   04/01/18 1300  nafcillin 2 g in sodium chloride 0.9 % 100 mL IVPB     2 g 200 mL/hr over 30 Minutes Intravenous Every 4 hours 04/01/18 1242     04/01/18 1200  nafcillin injection 2 g  Status:  Discontinued     2 g Intravenous Every 4 hours 04/01/18 1129 04/01/18 1242   03/31/18 0200  vancomycin (VANCOCIN) IVPB 750 mg/150 ml premix  Status:  Discontinued     750 mg 150 mL/hr over 60 Minutes Intravenous Every 8 hours 03/30/18 1628 04/01/18 1129   03/31/18 0100  meropenem (MERREM) 1 g in sodium chloride 0.9 % 100 mL IVPB  Status:  Discontinued     1 g 200 mL/hr over 30 Minutes Intravenous Every 8 hours 03/30/18 1628 04/01/18 1129   03/30/18 1630  vancomycin (VANCOCIN) 1,500 mg in sodium chloride 0.9 % 500 mL IVPB     1,500 mg 250 mL/hr over 120 Minutes Intravenous NOW 03/30/18 1619 03/30/18 1925   03/30/18 1630  meropenem (MERREM) 2 g in sodium chloride 0.9 % 100 mL IVPB     2 g 200 mL/hr over 30 Minutes Intravenous NOW 03/30/18 1619 03/30/18 1754   03/21/18 0930  ceFAZolin (ANCEF) IVPB 2g/100 mL premix  Status:  Discontinued     2 g 200 mL/hr over 30 Minutes Intravenous Every 8 hours 03/21/18 0920 03/30/18 1616      Procedures:   MRI C, T and L-spine done 03/30/2018 and 03/31/2018.    CT angiogram chest 03/30/2018.  1. No pulmonary embolus. 2. Moderate loculated left pleural effusion. Adjacent airspace disease likely compressive atelectasis, pneumonia not excluded in the setting of spinal infection and endocarditis. Mild right pleural thickening or loculated pleural fluid in the upper right hemithorax.  Repeat echocardiogram 03/30/2018  - Left ventricle: The cavity size was normal. Wall thickness was normal. Systolic function was normal. The estimated ejection fraction was in the range of 60% to 65%. Wall motion was normal; there were no regional wall motion abnormalities. Left ventricular diastolic function parameters were normal.  TEE - - Left ventricle: The cavity size was normal. Wall thickness wasnormal. Systolic function was normal. The estimated ejectionfraction was in the range of 60% to 65%. - Aortic valve: No evidence of vegetation. - Mitral valve: No evidence of vegetation. - Left atrium: No evidence of thrombus in the atrial cavity or appendage. No evidence of thrombus in the appendage. - Right atrium: No evidence of thrombus in the atrial cavity orappendage. - Tricuspid valve: There was a vegetation. There was a small (1.2cmx .6cm), mobile vegetation on the septal leaflet. There was mildregurgitation. - Pulmonic valve: No evidence of vegetation.  Impressions:   Tricuspid valve endocarditis. Discussed with primary team   MRI C-T-L Spine - 1. Epidural abscess from C2 to sacrum as described. Maximal cord compression from T3-4 to T5-6. The  thecal sac is completely effaced from L1 to L4-5. At the L3-4 interspinous space the spinal abscess communicates with large bilateral abscesses within the intrinsic back muscles. There is osteomyelitis of the L2, L3, and L4 spinous processes. No discitis or facet arthritis. 2. Left paravertebral abscess along the lower thoracic spine with small left empyema that is new from CT 2 days ago. 3. Distended bladder  CT - 1. Evidence of cellulitis involving the right side of the base of the neck extending into the right supraclavicular region with fluid and gas in the soft tissues at the base of the right side of the neck. 2. No significant abnormality of the thoracic spine. 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  CT -  1. Extensive abnormal fluid collection in the subcutaneous  fat of the midline of the back with underlying marked abnormality of the posterior paraspinal musculature from L1-2 through S3. This is worrisome for subcutaneous abscess and myositis. 2. Moth-eaten appearance of the spinous processes of L3 and L4 consistent with osteomyelitis. 3. No discrete epidural abscess. However, the abnormal edema in the paraspinal musculature extends to the posterior aspect of the spinal canal at L3-4. 4. MRI with and without contrast may better define the extent of the soft tissue and infection and could detect epidural extension that is not apparent on this unenhanced CT scan.  9/22>> PICC line  I&D by [facility] of back soft tissue abscess on -  03/22/18  CONSULTS:  N.Surgery, ID, general surgery, PCCM    MEDICATIONS:  Scheduled Meds:  . aspirin  81 mg Oral Daily  . atorvastatin  40 mg Oral q1800  . buprenorphine-naloxone  2 tablet Sublingual Daily  . feeding supplement (PRO-STAT SUGAR FREE 64)  30 mL Oral TID WC  . gabapentin  300 mg Oral TID  . heparin injection (subcutaneous)  5,000 Units Subcutaneous Q8H  . insulin aspart  0-5 Units Subcutaneous QHS  . insulin aspart  0-9 Units Subcutaneous TID WC  . insulin aspart  4 Units Subcutaneous TID WC  . insulin glargine  25 Units Subcutaneous QHS  . metoprolol tartrate  50 mg Oral BID  . nicotine  21 mg Transdermal Daily  . polyethylene glycol  17 g Oral BID  . senna-docusate  2 tablet Oral QHS  . tamsulosin  0.4 mg Oral BID   Continuous Infusions: . nafcillin IV 2 g (04/11/18 0905)   PRN Meds:.acetaminophen, bisacodyl, clonazepam, cyclobenzaprine, hydrALAZINE, LORazepam, magnesium hydroxide, metoprolol tartrate, nitroGLYCERIN, sodium chloride flush, sodium phosphate   PHYSICAL EXAM: Vital signs: Vitals:   04/10/18 0454 04/10/18 2147 04/11/18 0524 04/11/18 0853  BP: 116/62 (!) 104/59 114/73 124/75  Pulse: 83 85 83 94  Resp: 18 17 18    Temp: 99.3 F (37.4 C) 99.2 F (37.3 C) 99.2 F (37.3 C)   TempSrc:  Oral Oral Oral   SpO2: 95% 95% 94%   Weight:      Height:       Filed Weights   03/21/18 0602  Weight: 68 kg   Body mass index is 20.92 kg/m.   Exam  Physical Exam  [name] examined daily including today on 04/11/18 , exams remain the same as of yesterday except that has changed   Gen:- Awake Alert, in no acute distress HEENT:- [place].AT, No sclera icterus Neck-Supple Neck,No JVD,.  Lungs-  CTAB , fairly symmetrical air movement CV- S1, S2 normal, regular Abd-  +ve B.Sounds, Abd Soft, No tenderness,    Extremity/Skin:-+1 to +1ankle edema bilaterally, right  arm PICC line site is clean dry and intact Psych-affect is appropriate, oriented x3 Neuro-L Leg 5/5 GU- Foley with clear urine    LABORATORY DATA: CBC: Recent Labs  Lab 04/05/18 0754 04/05/18 1133 04/06/18 0355 04/07/18 0357 04/09/18 0317 04/10/18 0812  WBC 10.2  --  10.6* 9.1 8.9 8.7  HGB 6.7* 7.3* 7.2* 8.1* 7.8* 7.8*  HCT 22.2* 23.3* 23.8* 27.2* 25.8* 26.7*  MCV 98.7  --  98.8 97.1 97.7 99.3  PLT 306  --  328 382 367 384    Basic Metabolic Panel: Recent Labs  Lab 04/05/18 0754 04/06/18 0355 04/07/18 0357 04/10/18 0812  NA 136 136 137 137  K 2.4* 3.9 3.6 3.6  CL 98 96* 97* 99  CO2 33* 34* 31 30  GLUCOSE 169* 210* 289* 245*  BUN <5* 6 8 5*  CREATININE 0.49* 0.61 0.70 0.72  CALCIUM 6.6* 7.5* 7.8* 7.9*  MG 1.5* 1.9 1.7  --     GFR: Estimated Creatinine Clearance: 116.9 mL/min (by C-G formula based on SCr of 0.72 mg/dL).  Liver Function Tests: No results for input(s): AST, ALT, ALKPHOS, BILITOT, PROT, ALBUMIN in the last 168 hours. No results for input(s): LIPASE, AMYLASE in the last 168 hours. No results for input(s): AMMONIA in the last 168 hours.  Coagulation Profile: No results for input(s): INR, PROTIME in the last 168 hours.  Cardiac Enzymes: No results for input(s): CKTOTAL, CKMB, CKMBINDEX, TROPONINI in the last 168 hours.  BNP (last 3 results) No results for input(s): PROBNP in  the last 8760 hours.  HbA1C: No results for input(s): HGBA1C in the last 72 hours.  CBG: Recent Labs  Lab 04/10/18 0812 04/10/18 1217 04/10/18 1656 04/10/18 2153 04/11/18 0806  GLUCAP 231* 244* 159* 166* 123*    Lipid Profile: No results for input(s): CHOL, HDL, LDLCALC, TRIG, CHOLHDL, LDLDIRECT in the last 72 hours.  Thyroid Function Tests: No results for input(s): TSH, T4TOTAL, FREET4, T3FREE, THYROIDAB in the last 72 hours.  Anemia Panel: No results for input(s): VITAMINB12, FOLATE, FERRITIN, TIBC, IRON, RETICCTPCT in the last 72 hours.  Urine analysis:    Component Value Date/Time   COLORURINE YELLOW 03/30/2018 1930   APPEARANCEUR CLEAR 03/30/2018 1930   LABSPEC 1.009 03/30/2018 1930   PHURINE 6.0 03/30/2018 1930   GLUCOSEU 50 (A) 03/30/2018 1930   HGBUR SMALL (A) 03/30/2018 1930   BILIRUBINUR NEGATIVE 03/30/2018 1930   BILIRUBINUR small 02/26/2018 1202   KETONESUR NEGATIVE 03/30/2018 1930   PROTEINUR NEGATIVE 03/30/2018 1930   UROBILINOGEN 1.0 02/26/2018 1202   UROBILINOGEN 1.0 12/11/2016 1020   NITRITE NEGATIVE 03/30/2018 1930   LEUKOCYTESUR TRACE (A) 03/30/2018 1930    Sepsis Labs: Lactic Acid, Venous    Component Value Date/Time   LATICACIDVEN 1.2 03/30/2018 1837    MICROBIOLOGY: No results found for this or any previous visit (from the past 240 hour(s)).  RADIOLOGY STUDIES/RESULTS:  Ct Angio Chest Pe W Or Wo Contrast  Result Date: 03/30/2018 CLINICAL DATA:  Shortness of breath PE suspected, high pretest prob. Patient with known epidural abscess and endocarditis. Now with left-sided chest pain. EXAM: CT ANGIOGRAPHY CHEST WITH CONTRAST TECHNIQUE: Multidetector CT imaging of the chest was performed using the standard protocol during bolus administration of intravenous contrast. Multiplanar CT image reconstructions and MIPs were obtained to evaluate the vascular anatomy. CONTRAST:  54mL ISOVUE-370 IOPAMIDOL (ISOVUE-370) INJECTION 76% COMPARISON:  Chest  radiographs earlier this day. FINDINGS: Cardiovascular: There are no filling defects within the pulmonary arteries to suggest pulmonary  embolus. The thoracic aorta is normal in caliber without dissection. Mild cardiomegaly. Small amount pericardial fluid. Mediastinum/Nodes: Loculated left pleural effusion tracks along the left aspect of the mediastinum. Small left hilar nodes without bulky adenopathy. No enlarged mediastinal nodes. No dominant thyroid nodule. The esophagus is decompressed. Lungs/Pleura: Moderate left pleural effusion is loculated and tracks along the posterior, lateral, and medial hemithorax. Adjacent airspace disease in the left lower lobe, favoring compressive atelectasis, with adjacent compressive atelectasis in the right upper lobe. Pleural thickening versus loculated pleural effusion the upper medial right hemithorax, with additional areas of pleural thickening dependently. Dependent atelectasis in the right lower lobe. Trachea and bronchi are patent. Upper Abdomen: No acute findings. Musculoskeletal: Patient with known epidural abscess, not well delineated by CT. No bony destructive change. Review of the MIP images confirms the above findings. IMPRESSION: 1. No pulmonary embolus. 2. Moderate loculated left pleural effusion. Adjacent airspace disease likely compressive atelectasis, pneumonia not excluded in the setting of spinal infection and endocarditis. Mild right pleural thickening or loculated pleural fluid in the upper right hemithorax. Electronically Signed   By: Narda Rutherford M.D.   On: 03/30/2018 23:21   Ct Thoracic Spine Wo Contrast  Result Date: 03/20/2018 CLINICAL DATA:  Progressive back pain.  Swelling of the lower back. EXAM: CT THORACIC SPINE WITHOUT CONTRAST TECHNIQUE: Multidetector CT images of the thoracic were obtained using the standard protocol without intravenous contrast. COMPARISON:  None. FINDINGS: Alignment: Normal. Vertebrae: Congenital butterfly vertebra at  T12. Paraspinal and other soft tissues: There is abnormal gas and fluid in the soft tissues of the right side of the base of the neck and in the right supraclavicular region. Paraspinal soft tissues appear normal throughout the thoracic spine. Disc levels: There is no evidence of disc protrusion or significant disc bulging or spinal or foraminal stenosis or other significant abnormality of the thoracic spine. IMPRESSION: 1. Evidence of cellulitis involving the right side of the base of the neck extending into the right supraclavicular region with fluid and gas in the soft tissues at the base of the right side of the neck. 2. No significant abnormality of the thoracic spine. 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 Electronically Signed   By: Francene Boyers M.D.   On: 03/20/2018 11:25   Ct Lumbar Spine Wo Contrast  Addendum Date: 03/20/2018   ADDENDUM REPORT: 03/20/2018 11:44 ADDENDUM: Critical Value/emergent results were called by telephone at the time of interpretation on 03/20/2018 at 11:30 am to Dr. Sharyn Creamer , who verbally acknowledged these results. Electronically Signed   By: Francene Boyers M.D.   On: 03/20/2018 11:44   Result Date: 03/20/2018 CLINICAL DATA:  Increasing low back pain and soft tissue swelling. EXAM: CT LUMBAR SPINE WITHOUT CONTRAST TECHNIQUE: Multidetector CT imaging of the lumbar spine was performed without intravenous contrast administration. Multiplanar CT image reconstructions were also generated. IV contrast could not be utilized due to the lack of an appropriate IV. COMPARISON:  None. FINDINGS: Segmentation: 5 lumbar type vertebrae. Alignment: Normal. Vertebrae: There is a moth-eaten appearance of the spinous processes of L3 and L4 which is worrisome for osteomyelitis. Bilateral pars defects at L5 with grade 1 spondylolisthesis. 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 Paraspinal and other soft tissues: There is an extensive abnormal fluid collection in the subcutaneous soft tissues of  the posterior aspect of the back extending from approximately L1-2 to S3. This fluid collection is lobulated and measures approximately 20 x 9 x 2.5 cm. It is centered slightly to the left  of midline and has a mass effect upon the adjacent posterior paraspinal muscles. There is abnormal lucency in the underlying paraspinal muscles which could represent myositis. Disc levels: T11-12: No significant abnormality. Butterfly T12 vertebra. T12-L1: No significant abnormality. L1-2: Normal disc. Abnormal edema in the posterior paraspinal musculature with adjacent fluid collection in the subcutaneous fat of the posterior aspect of the back as described above. L2-3: Normal disc. L3-4: Normal disc. Lucency in the posterior paraspinal soft tissues extends to the posterior aspect of the thecal sac on image 80 of series 4 but there is no discrete epidural abscess. L4-5: Normal disc.  No evidence of epidural abscess. L5-S1: Grade 1 spondylolisthesis. No disc bulging or protrusion. Bilateral pars defects. No visible epidural abscess. IMPRESSION: 1. Extensive abnormal fluid collection in the subcutaneous fat of the midline of the back with underlying marked abnormality of the posterior paraspinal musculature from L1-2 through S3. This is worrisome for subcutaneous abscess and myositis. 2. Moth-eaten appearance of the spinous processes of L3 and L4 consistent with osteomyelitis. 3. No discrete epidural abscess. However, the abnormal edema in the paraspinal musculature extends to the posterior aspect of the spinal canal at L3-4. 4. MRI with and without contrast may better define the extent of the soft tissue and infection and could detect epidural extension that is not apparent on this unenhanced CT scan. Electronically Signed: By: Francene Boyers M.D. On: 03/20/2018 11:18   Mr Cervical Spine Wo Contrast  Result Date: 03/30/2018 CLINICAL DATA:  42 year old male with history of MSSA bacteremia with known endocarditis and epidural  abscess extending from C2 through the sacrum. Recent episode of hypotension and acute lower extremity weakness. Evaluate abscess and possible spinal cord infarct. EXAM: MRI CERVICAL, THORACIC SPINE WITHOUT CONTRAST TECHNIQUE: Multiplanar and multiecho pulse sequences of the cervical spine, to include the craniocervical junction and cervicothoracic junction, and thoracic and lumbar spine, were obtained without intravenous contrast. COMPARISON:  Prior MRI from 03/22/2018. FINDINGS: MRI CERVICAL SPINE FINDINGS Alignment: Examination technically limited as the patient was unable to tolerate the full length of the exam. Additionally, images provided are degraded by motion artifact. Reversal of the normal cervical lordosis with apex at C5, stable. No interval listhesis or malalignment. Vertebrae: Vertebral body height maintained without acute or interval fracture. Bone marrow signal intensity diffusely decreased on T1 weighted imaging, suspected to be related to anemia and chronic disease. No discrete osseous lesions. No abnormal marrow edema. No evidence for interval discitis. Cord: Signal intensity within the cervical spinal cord is within normal limits. No findings to suggest interval cord infarction on this motion degraded and limited exam. Previously identified epidural collection involving the ventral epidural space extending from C2 inferiorly is decreased in size, now measuring up to 7 mm in maximal AP diameter at the level of C2. Collection diffusely involves the ventral epidural space, but also is seen dorsally as well. Slightly improved diffuse spinal stenosis with thecal sac patency. Posterior Fossa, vertebral arteries, paraspinal tissues: Visualized brain and posterior fossa within normal limits. Craniocervical junction normal. Scattered edema within the posterior paraspinous soft tissues. Normal intravascular flow voids seen within the vertebral arteries bilaterally. Disc levels: C2-C3: Unremarkable. C3-C4:  Small left foraminal protrusion with associated moderate left C4 foraminal stenosis. C4-C5:  Unremarkable. C5-C6: Left eccentric disc bulge with uncovertebral hypertrophy. Moderate left C6 foraminal narrowing. C6-C7: Right foraminal disc protrusion with associated moderate right C7 foraminal stenosis. C7-T1:  Unremarkable. MRI THORACIC SPINE FINDINGS Alignment: Examination markedly limited as the patient was unable to  tolerate the full length of the exam. Sagittal T1, T2, and STIR sequences only were performed. No axial images obtained. Vertebral bodies normally aligned with preservation of the normal thoracic kyphosis. Vertebrae: Vertebral body height maintained without evidence for interval fracture. Butterfly vertebra noted at T12. Diffusely decreased T1 weighted signal intensity throughout the visualized bone marrow, like related to anemia chronic disease. No findings to suggest interval or new discitis or septic arthritis. Cord: Signal intensity within the thoracic spinal cord grossly within normal limits on these limited sagittal views. No definite cord signal abnormality or cord edema to suggest acute spinal cord infarction. Previously seen diffuse epidural collection appears overall decreased in size from previous, with improved spinal stenosis and thecal sac patency. Paraspinal and other soft tissues: Scattered edema seen within the posterior paraspinous soft tissues at the upper back/cervicothoracic junction. No discrete soft tissue collections. Disc levels: T5-6: Central disc protrusion indenting upon the ventral thoracic spinal cord, stable. IMPRESSION: 1. Technically limited exam due to motion artifact and the patient's inability to tolerate the full length of the exam. Cervical and thoracic spine only was imaged, and only sagittal sequences of the thoracic spine were obtained. 2. No imaging findings to suggest spinal cord infarction identified on this limited exam. 3. Interval improvement in diffuse  epidural collection, decreased in size as compared to previous exam with improved diffuse spinal stenosis and thecal sac patency. No new discitis or facet arthritis. Electronically Signed   By: Rise Mu M.D.   On: 03/30/2018 22:52   Mr Thoracic Spine Wo Contrast  Result Date: 03/30/2018 CLINICAL DATA:  42 year old male with history of MSSA bacteremia with known endocarditis and epidural abscess extending from C2 through the sacrum. Recent episode of hypotension and acute lower extremity weakness. Evaluate abscess and possible spinal cord infarct. EXAM: MRI CERVICAL, THORACIC SPINE WITHOUT CONTRAST TECHNIQUE: Multiplanar and multiecho pulse sequences of the cervical spine, to include the craniocervical junction and cervicothoracic junction, and thoracic and lumbar spine, were obtained without intravenous contrast. COMPARISON:  Prior MRI from 03/22/2018. FINDINGS: MRI CERVICAL SPINE FINDINGS Alignment: Examination technically limited as the patient was unable to tolerate the full length of the exam. Additionally, images provided are degraded by motion artifact. Reversal of the normal cervical lordosis with apex at C5, stable. No interval listhesis or malalignment. Vertebrae: Vertebral body height maintained without acute or interval fracture. Bone marrow signal intensity diffusely decreased on T1 weighted imaging, suspected to be related to anemia and chronic disease. No discrete osseous lesions. No abnormal marrow edema. No evidence for interval discitis. Cord: Signal intensity within the cervical spinal cord is within normal limits. No findings to suggest interval cord infarction on this motion degraded and limited exam. Previously identified epidural collection involving the ventral epidural space extending from C2 inferiorly is decreased in size, now measuring up to 7 mm in maximal AP diameter at the level of C2. Collection diffusely involves the ventral epidural space, but also is seen dorsally as  well. Slightly improved diffuse spinal stenosis with thecal sac patency. Posterior Fossa, vertebral arteries, paraspinal tissues: Visualized brain and posterior fossa within normal limits. Craniocervical junction normal. Scattered edema within the posterior paraspinous soft tissues. Normal intravascular flow voids seen within the vertebral arteries bilaterally. Disc levels: C2-C3: Unremarkable. C3-C4: Small left foraminal protrusion with associated moderate left C4 foraminal stenosis. C4-C5:  Unremarkable. C5-C6: Left eccentric disc bulge with uncovertebral hypertrophy. Moderate left C6 foraminal narrowing. C6-C7: Right foraminal disc protrusion with associated moderate right C7 foraminal stenosis. C7-T1:  Unremarkable. MRI THORACIC SPINE FINDINGS Alignment: Examination markedly limited as the patient was unable to tolerate the full length of the exam. Sagittal T1, T2, and STIR sequences only were performed. No axial images obtained. Vertebral bodies normally aligned with preservation of the normal thoracic kyphosis. Vertebrae: Vertebral body height maintained without evidence for interval fracture. Butterfly vertebra noted at T12. Diffusely decreased T1 weighted signal intensity throughout the visualized bone marrow, like related to anemia chronic disease. No findings to suggest interval or new discitis or septic arthritis. Cord: Signal intensity within the thoracic spinal cord grossly within normal limits on these limited sagittal views. No definite cord signal abnormality or cord edema to suggest acute spinal cord infarction. Previously seen diffuse epidural collection appears overall decreased in size from previous, with improved spinal stenosis and thecal sac patency. Paraspinal and other soft tissues: Scattered edema seen within the posterior paraspinous soft tissues at the upper back/cervicothoracic junction. No discrete soft tissue collections. Disc levels: T5-6: Central disc protrusion indenting upon the  ventral thoracic spinal cord, stable. IMPRESSION: 1. Technically limited exam due to motion artifact and the patient's inability to tolerate the full length of the exam. Cervical and thoracic spine only was imaged, and only sagittal sequences of the thoracic spine were obtained. 2. No imaging findings to suggest spinal cord infarction identified on this limited exam. 3. Interval improvement in diffuse epidural collection, decreased in size as compared to previous exam with improved diffuse spinal stenosis and thecal sac patency. No new discitis or facet arthritis. Electronically Signed   By: Rise Mu M.D.   On: 03/30/2018 22:52   Mr Cervical Spine W Wo Contrast  Result Date: 03/22/2018 CLINICAL DATA:  Spine infection. EXAM: MRI TOTAL SPINE WITHOUT AND WITH CONTRAST TECHNIQUE: Multisequence MR imaging of the spine from the cervical spine to the sacrum was performed prior to and following IV contrast administration. CONTRAST:  6 cc Gadavist intravenous COMPARISON:  CT of the thoracic and lumbar spine from 2 days ago FINDINGS: MRI CERVICAL SPINE FINDINGS Alignment: Normal Vertebrae: No evidence of osseous infection. Canal/Cord: There is extensive spinal fluid collection preferentially in the ventral but also in the right more than left dorsal canal. Subarachnoid space is diffusely effaced. Maximal thickness is posterior to C2 at 9 mm. No cord signal abnormality. Posterior Fossa, vertebral arteries, paraspinal tissues: No retropharyngeal or other discrete soft tissue collection. Disc levels: C2-3: Unremarkable. C3-4: Small left foraminal protrusion with moderate narrowing C4-5: Unremarkable. C5-6: Disc narrowing and bulging with asymmetric left uncovertebral spurring. Left foraminal impingement C6-7: Right foraminal protrusion mild narrowing. C7-T1:Unremarkable. MRI THORACIC SPINE FINDINGS Alignment:  Normal Vertebrae: No evidence of osteomyelitis or discitis. T12 butterfly vertebra. Canal/Cord: Cervical  ventral epidural collection continues throughout the thoracic levels. There is also a focal dorsal component at T3-4 to T5-6, where thecal sac effacement is accentuated and there is cord flattening. No cord edema. Paraspinal and other soft tissues: Paraspinous phlegmon on the left at T8-T12, with new complex left pleural effusion and lower lobe atelectasis Disc levels: T5-6 central disc protrusion. MRI LUMBAR SPINE FINDINGS Segmentation:  5 lumbar type vertebral bodies Alignment:  Grade 1 anterolisthesis at L5-S1. Vertebrae: Marrow edema and heterogeneous enhancement within the L2, L3, and L4 spinous processes. No discitis or facet edema. Conus medullaris: Extends to the L1 level and is non edematous. There is extensive epidural collection completely effacing the thecal sac throughout the lumbar spine until L4-5 and below where the collection becomes ventral and right eccentric. The infection communicates  with extensive bilateral abscess within the intrinsic back muscles via the interspinous space at L3-4. Patient had recent subcutaneous collection and left buttocks collection drainage, with packing seen in place. This midline, upper subcutaneous collection communicates with the paravertebral abscess along its superior margin based on postcontrast axial images. Paraspinal and other soft tissues: As above.  Distended bladder Disc levels: Chronic bilateral pars defects at L5. Critical Value/emergent results were called by telephone at the time of interpretation on 03/22/2018 at 3:04 pm to Dr. Thedore Mins , who verbally acknowledged these results. IMPRESSION: 1. Epidural abscess from C2 to sacrum as described. Maximal cord compression from T3-4 to T5-6. The thecal sac is completely effaced from L1 to L4-5. At the L3-4 interspinous space the spinal abscess communicates with large bilateral abscesses within the intrinsic back muscles. There is osteomyelitis of the L2, L3, and L4 spinous processes. No discitis or facet  arthritis. 2. Left paravertebral abscess along the lower thoracic spine with small left empyema that is new from CT 2 days ago. 3. Distended bladder Electronically Signed   By: Marnee Spring M.D.   On: 03/22/2018 15:11   Mr Thoracic Spine W Wo Contrast  Result Date: 03/22/2018 CLINICAL DATA:  Spine infection. EXAM: MRI TOTAL SPINE WITHOUT AND WITH CONTRAST TECHNIQUE: Multisequence MR imaging of the spine from the cervical spine to the sacrum was performed prior to and following IV contrast administration. CONTRAST:  6 cc Gadavist intravenous COMPARISON:  CT of the thoracic and lumbar spine from 2 days ago FINDINGS: MRI CERVICAL SPINE FINDINGS Alignment: Normal Vertebrae: No evidence of osseous infection. Canal/Cord: There is extensive spinal fluid collection preferentially in the ventral but also in the right more than left dorsal canal. Subarachnoid space is diffusely effaced. Maximal thickness is posterior to C2 at 9 mm. No cord signal abnormality. Posterior Fossa, vertebral arteries, paraspinal tissues: No retropharyngeal or other discrete soft tissue collection. Disc levels: C2-3: Unremarkable. C3-4: Small left foraminal protrusion with moderate narrowing C4-5: Unremarkable. C5-6: Disc narrowing and bulging with asymmetric left uncovertebral spurring. Left foraminal impingement C6-7: Right foraminal protrusion mild narrowing. C7-T1:Unremarkable. MRI THORACIC SPINE FINDINGS Alignment:  Normal Vertebrae: No evidence of osteomyelitis or discitis. T12 butterfly vertebra. Canal/Cord: Cervical ventral epidural collection continues throughout the thoracic levels. There is also a focal dorsal component at T3-4 to T5-6, where thecal sac effacement is accentuated and there is cord flattening. No cord edema. Paraspinal and other soft tissues: Paraspinous phlegmon on the left at T8-T12, with new complex left pleural effusion and lower lobe atelectasis Disc levels: T5-6 central disc protrusion. MRI LUMBAR SPINE  FINDINGS Segmentation:  5 lumbar type vertebral bodies Alignment:  Grade 1 anterolisthesis at L5-S1. Vertebrae: Marrow edema and heterogeneous enhancement within the L2, L3, and L4 spinous processes. No discitis or facet edema. Conus medullaris: Extends to the L1 level and is non edematous. There is extensive epidural collection completely effacing the thecal sac throughout the lumbar spine until L4-5 and below where the collection becomes ventral and right eccentric. The infection communicates with extensive bilateral abscess within the intrinsic back muscles via the interspinous space at L3-4. Patient had recent subcutaneous collection and left buttocks collection drainage, with packing seen in place. This midline, upper subcutaneous collection communicates with the paravertebral abscess along its superior margin based on postcontrast axial images. Paraspinal and other soft tissues: As above.  Distended bladder Disc levels: Chronic bilateral pars defects at L5. Critical Value/emergent results were called by telephone at the time of interpretation on 03/22/2018 at  3:04 pm to Dr. Thedore Mins , who verbally acknowledged these results. IMPRESSION: 1. Epidural abscess from C2 to sacrum as described. Maximal cord compression from T3-4 to T5-6. The thecal sac is completely effaced from L1 to L4-5. At the L3-4 interspinous space the spinal abscess communicates with large bilateral abscesses within the intrinsic back muscles. There is osteomyelitis of the L2, L3, and L4 spinous processes. No discitis or facet arthritis. 2. Left paravertebral abscess along the lower thoracic spine with small left empyema that is new from CT 2 days ago. 3. Distended bladder Electronically Signed   By: Marnee Spring M.D.   On: 03/22/2018 15:11   Mr Lumbar Spine W Wo Contrast  Result Date: 03/22/2018 CLINICAL DATA:  Spine infection. EXAM: MRI TOTAL SPINE WITHOUT AND WITH CONTRAST TECHNIQUE: Multisequence MR imaging of the spine from the  cervical spine to the sacrum was performed prior to and following IV contrast administration. CONTRAST:  6 cc Gadavist intravenous COMPARISON:  CT of the thoracic and lumbar spine from 2 days ago FINDINGS: MRI CERVICAL SPINE FINDINGS Alignment: Normal Vertebrae: No evidence of osseous infection. Canal/Cord: There is extensive spinal fluid collection preferentially in the ventral but also in the right more than left dorsal canal. Subarachnoid space is diffusely effaced. Maximal thickness is posterior to C2 at 9 mm. No cord signal abnormality. Posterior Fossa, vertebral arteries, paraspinal tissues: No retropharyngeal or other discrete soft tissue collection. Disc levels: C2-3: Unremarkable. C3-4: Small left foraminal protrusion with moderate narrowing C4-5: Unremarkable. C5-6: Disc narrowing and bulging with asymmetric left uncovertebral spurring. Left foraminal impingement C6-7: Right foraminal protrusion mild narrowing. C7-T1:Unremarkable. MRI THORACIC SPINE FINDINGS Alignment:  Normal Vertebrae: No evidence of osteomyelitis or discitis. T12 butterfly vertebra. Canal/Cord: Cervical ventral epidural collection continues throughout the thoracic levels. There is also a focal dorsal component at T3-4 to T5-6, where thecal sac effacement is accentuated and there is cord flattening. No cord edema. Paraspinal and other soft tissues: Paraspinous phlegmon on the left at T8-T12, with new complex left pleural effusion and lower lobe atelectasis Disc levels: T5-6 central disc protrusion. MRI LUMBAR SPINE FINDINGS Segmentation:  5 lumbar type vertebral bodies Alignment:  Grade 1 anterolisthesis at L5-S1. Vertebrae: Marrow edema and heterogeneous enhancement within the L2, L3, and L4 spinous processes. No discitis or facet edema. Conus medullaris: Extends to the L1 level and is non edematous. There is extensive epidural collection completely effacing the thecal sac throughout the lumbar spine until L4-5 and below where the  collection becomes ventral and right eccentric. The infection communicates with extensive bilateral abscess within the intrinsic back muscles via the interspinous space at L3-4. Patient had recent subcutaneous collection and left buttocks collection drainage, with packing seen in place. This midline, upper subcutaneous collection communicates with the paravertebral abscess along its superior margin based on postcontrast axial images. Paraspinal and other soft tissues: As above.  Distended bladder Disc levels: Chronic bilateral pars defects at L5. Critical Value/emergent results were called by telephone at the time of interpretation on 03/22/2018 at 3:04 pm to Dr. Thedore Mins , who verbally acknowledged these results. IMPRESSION: 1. Epidural abscess from C2 to sacrum as described. Maximal cord compression from T3-4 to T5-6. The thecal sac is completely effaced from L1 to L4-5. At the L3-4 interspinous space the spinal abscess communicates with large bilateral abscesses within the intrinsic back muscles. There is osteomyelitis of the L2, L3, and L4 spinous processes. No discitis or facet arthritis. 2. Left paravertebral abscess along the lower thoracic spine with  small left empyema that is new from CT 2 days ago. 3. Distended bladder Electronically Signed   By: Marnee Spring M.D.   On: 03/22/2018 15:11   Dg Chest Port 1 View  Result Date: 03/30/2018 CLINICAL DATA:  Shortness of breath EXAM: PORTABLE CHEST 1 VIEW COMPARISON:  03/30/2018 at 0227 hours FINDINGS: Moderate layering left pleural effusion, increased. Left lower lobe opacity, atelectasis versus pneumonia. Right lung is clear.  No pneumothorax. The heart is normal in size. IMPRESSION: Moderate layering left pleural effusion, increased. Left lower lobe opacity, atelectasis versus pneumonia. Electronically Signed   By: Charline Bills M.D.   On: 03/30/2018 19:06   Dg Chest Port 1 View  Result Date: 03/30/2018 CLINICAL DATA:  Chest pain EXAM: PORTABLE CHEST  1 VIEW COMPARISON:  None. FINDINGS: Retrocardiac opacity, atelectasis versus pneumonia. Possible small left pleural effusion. Right lung is clear. No pneumothorax. The heart is normal in size. IMPRESSION: Retrocardiac opacity, atelectasis versus pneumonia. Possible small left pleural effusion. Electronically Signed   By: Charline Bills M.D.   On: 03/30/2018 02:49   [place] Ekg Site Rite  Result Date: 04/04/2018 If Site Rite image not attached, placement could not be confirmed due to current cardiac rhythm.  [place] Ekg Site Rite  Result Date: 03/21/2018 If Site Rite image not attached, placement could not be confirmed due to current cardiac rhythm.    LOS: 22 days   Signature  Shon Hale M.D on 04/11/2018 at 11:22 AM  To page go to [email or web address] - password [email or web address]

## 2018-04-12 LAB — GLUCOSE, CAPILLARY
Glucose-Capillary: 143 mg/dL — ABNORMAL HIGH (ref 70–99)
Glucose-Capillary: 150 mg/dL — ABNORMAL HIGH (ref 70–99)
Glucose-Capillary: 139 mg/dL — ABNORMAL HIGH (ref 70–99)

## 2018-04-12 NOTE — Progress Notes (Signed)
PROGRESS NOTE        PATIENT DETAILS  Name: Tony Long  Age: 42 y.o.  Sex: male Date of Birth: 15-Jul-1975 Admit Date: 03/20/2018 Admitting Physician Kendell Bane, MD  ZOX:[id number], Rolm Gala, FNP   Brief Narrative:  [name] a 42 y.o. male history of IVDA, DM-2, hypertension admitted for worsening back pain-further evaluation revealed MSSA bacteremia with tricuspid valve endocarditis, epidural abscess involving C2 through sacrum, and numerous soft tissue back abscesses.  Evaluated by general surgery-underwent I&D of her lower back soft tissue abscess, evaluated by neurosurgery-not felt to be a candidate for decompressive surgery due to extensive nature of the disease and lack of any significant neurological findings.  ID following He was initially kept on IV cefazolin and then antibiotic coverage was broadened to Vancomycin and Rocephin on 03/30/2018 by ID.   Currently on IV nafcillin on 04/16/2018, thereafter 4 weeks of Keflex 500 p.o. twice daily per ID.Marland Kitchen   See below for further details   Subjective:  Ambulating the room and in hallway, low back pain is improving  Assessment/Plan:   MSSA Bacteremia with tricuspid valve endocarditis, extensive epidural abscess (C2 through sacrum) and large soft tissue lower back abscess  : due to extensive nature of the epidural abscess and lack of concerning neurological findings-neurosurgery does not recommend decompressive surgery. Seen by ID and current recommendation noted, he was initially kept on IV cefazolin and then antibiotic coverage was broadened to Vancomycin and Rocephin on 03/30/2018 by ID.   Currently on IV nafcillin on 04/16/2018, thereafter 4 weeks of Keflex 500 p.o. twice daily per ID.. D/w Dr Jolayne Haines on 04/09/18, he confirms antibiotic plan above  Given history of IVDA-not a candidate for outpatient IV Abx therapy. He has received a PICC line, continue IV antibiotics through PICC Line.  Patient doing  fairly well,   Patient aware of the extensive nature of this infection-with life-threatening and life disabling risks including quadriplegia along with life-threatening sepsis with this present infection which is nonsurgical.     Cocaine/Substance abuse: Withdrawal symptoms much better-on 9/25 patient developed excessive sedation-Klonopin/Flexeril has been changed to as needed dosing-clonidine being tapered down.  On 03/28/2018 he requested  that he be put back on his Suboxone, he does not want short-acting pain medications anymore,    Polysubstance abuse/IVDA: Counseled to quit.  Anemia.  Normocytic, he had some chronic anemia upon admission as well and now worse due to him dilution from IV fluids also some element of iron deficiency per anemia panel likely due to multiple blood draws.  No signs of ongoing bleeding or acute blood loss, s/p  1 unit of packed RBC on 04/06/2018 and monitor, hgb is > 9  Mild lower extremity leg edema.  ---improving with TED stockings and gentle Lasix on 04/03/2018 & post RBC on 04/06/2018 and monitor.    New onset of left-sided pleuritic chest pain starting on late night 03/29/2018.  Nonspecific EKG changes stable on combination of aspirin, beta-blocker and statin.  Echocardiogram shows no wall motion abnormality and preserved EF, pain is completely resolved.  Hypertension: Monitor on beta-blocker.  Acute urinary retention: Continue Foley and Flomax, trial of Foley removal failed on 04/03/2018, likely combination of lack of activity and spine infection, failed voiding trial again on 04/09/2018, Foley reinserted and 1450 mL of urine removed, ,will need outpatient follow-up with urology discharge  Severe hypokalemia and hypomagnesemia. -Replace and recheck  DM-2: Poor outpatient control, last A1c 12.6, continue Lantus and sliding scale.  Lantus dose adjusted for better control.   CBG (last 3)  Recent Labs    04/11/18 2208 04/12/18 0808 04/12/18 1206  GLUCAP 279*  143* 150*   Lab Results  Component Value Date   HGBA1C 12.6 (A) 02/26/2018     DVT Prophylaxis: Prophylactic heparin  Code Status: Full code  Disposition Plan: Home after completing IV antibiotics ...04/16/18 or 04/17/18  Antimicrobial agents: Anti-infectives (From admission, onward)   Start     Dose/Rate Route Frequency Ordered Stop   04/01/18 1300  nafcillin 2 g in sodium chloride 0.9 % 100 mL IVPB     2 g 200 mL/hr over 30 Minutes Intravenous Every 4 hours 04/01/18 1242     04/01/18 1200  nafcillin injection 2 g  Status:  Discontinued     2 g Intravenous Every 4 hours 04/01/18 1129 04/01/18 1242   03/31/18 0200  vancomycin (VANCOCIN) IVPB 750 mg/150 ml premix  Status:  Discontinued     750 mg 150 mL/hr over 60 Minutes Intravenous Every 8 hours 03/30/18 1628 04/01/18 1129   03/31/18 0100  meropenem (MERREM) 1 g in sodium chloride 0.9 % 100 mL IVPB  Status:  Discontinued     1 g 200 mL/hr over 30 Minutes Intravenous Every 8 hours 03/30/18 1628 04/01/18 1129   03/30/18 1630  vancomycin (VANCOCIN) 1,500 mg in sodium chloride 0.9 % 500 mL IVPB     1,500 mg 250 mL/hr over 120 Minutes Intravenous NOW 03/30/18 1619 03/30/18 1925   03/30/18 1630  meropenem (MERREM) 2 g in sodium chloride 0.9 % 100 mL IVPB     2 g 200 mL/hr over 30 Minutes Intravenous NOW 03/30/18 1619 03/30/18 1754   03/21/18 0930  ceFAZolin (ANCEF) IVPB 2g/100 mL premix  Status:  Discontinued     2 g 200 mL/hr over 30 Minutes Intravenous Every 8 hours 03/21/18 0920 03/30/18 1616      Procedures:   MRI C, T and L-spine done 03/30/2018 and 03/31/2018.    CT angiogram chest 03/30/2018.  1. No pulmonary embolus. 2. Moderate loculated left pleural effusion. Adjacent airspace disease likely compressive atelectasis, pneumonia not excluded in the setting of spinal infection and endocarditis. Mild right pleural thickening or loculated pleural fluid in the upper right hemithorax.  Repeat echocardiogram 03/30/2018 -  Left ventricle: The cavity size was normal. Wall thickness was normal. Systolic function was normal. The estimated ejection fraction was in the range of 60% to 65%. Wall motion was normal; there were no regional wall motion abnormalities. Left ventricular diastolic function parameters were normal.  TEE - - Left ventricle: The cavity size was normal. Wall thickness wasnormal. Systolic function was normal. The estimated ejectionfraction was in the range of 60% to 65%. - Aortic valve: No evidence of vegetation. - Mitral valve: No evidence of vegetation. - Left atrium: No evidence of thrombus in the atrial cavity or appendage. No evidence of thrombus in the appendage. - Right atrium: No evidence of thrombus in the atrial cavity orappendage. - Tricuspid valve: There was a vegetation. There was a small (1.2cmx .6cm), mobile vegetation on the septal leaflet. There was mildregurgitation. - Pulmonic valve: No evidence of vegetation.  Impressions:   Tricuspid valve endocarditis. Discussed with primary team   MRI C-T-L Spine - 1. Epidural abscess from C2 to sacrum as described. Maximal cord compression from T3-4 to T5-6. The  thecal sac is completely effaced from L1 to L4-5. At the L3-4 interspinous space the spinal abscess communicates with large bilateral abscesses within the intrinsic back muscles. There is osteomyelitis of the L2, L3, and L4 spinous processes. No discitis or facet arthritis. 2. Left paravertebral abscess along the lower thoracic spine with small left empyema that is new from CT 2 days ago. 3. Distended bladder  CT - 1. Evidence of cellulitis involving the right side of the base of the neck extending into the right supraclavicular region with fluid and gas in the soft tissues at the base of the right side of the neck. 2. No significant abnormality of the thoracic spine. 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  CT -  1. Extensive abnormal fluid collection in the subcutaneous fat  of the midline of the back with underlying marked abnormality of the posterior paraspinal musculature from L1-2 through S3. This is worrisome for subcutaneous abscess and myositis. 2. Moth-eaten appearance of the spinous processes of L3 and L4 consistent with osteomyelitis. 3. No discrete epidural abscess. However, the abnormal edema in the paraspinal musculature extends to the posterior aspect of the spinal canal at L3-4. 4. MRI with and without contrast may better define the extent of the soft tissue and infection and could detect epidural extension that is not apparent on this unenhanced CT scan.  9/22>> PICC line  I&D by [facility] of back soft tissue abscess on -  03/22/18  CONSULTS:  N.Surgery, ID, general surgery, PCCM    MEDICATIONS:  Scheduled Meds:  . aspirin  81 mg Oral Daily  . atorvastatin  40 mg Oral q1800  . buprenorphine-naloxone  2 tablet Sublingual Daily  . feeding supplement (PRO-STAT SUGAR FREE 64)  30 mL Oral TID WC  . gabapentin  300 mg Oral TID  . heparin injection (subcutaneous)  5,000 Units Subcutaneous Q8H  . insulin aspart  0-5 Units Subcutaneous QHS  . insulin aspart  0-9 Units Subcutaneous TID WC  . insulin aspart  4 Units Subcutaneous TID WC  . insulin glargine  25 Units Subcutaneous QHS  . metoprolol tartrate  50 mg Oral BID  . nicotine  21 mg Transdermal Daily  . polyethylene glycol  17 g Oral BID  . senna-docusate  2 tablet Oral QHS  . tamsulosin  0.4 mg Oral BID   Continuous Infusions: . nafcillin IV 2 g (04/12/18 1318)   PRN Meds:.acetaminophen, bisacodyl, clonazepam, cyclobenzaprine, hydrALAZINE, LORazepam, magnesium hydroxide, metoprolol tartrate, nitroGLYCERIN, sodium chloride flush, sodium phosphate   PHYSICAL EXAM: Vital signs: Vitals:   04/11/18 0853 04/11/18 2204 04/12/18 0625 04/12/18 1441  BP: 124/75 120/74 137/79 96/61  Pulse: 94 95 88 84  Resp:  16 16 16   Temp:  98.8 F (37.1 C) 99.7 F (37.6 C) 99 F (37.2 C)  TempSrc:  Oral Oral  Oral  SpO2:  93% 93% 97%  Weight:      Height:       Filed Weights   03/21/18 0602  Weight: 68 kg   Body mass index is 20.92 kg/m.   Exam  Physical Exam  [name] examined daily including today on 04/12/18 , exams remain the same as of yesterday except that has changed   Gen:- Awake Alert, in no acute distress, pleasant HEENT:- [place].AT, No sclera icterus Neck-Supple Neck,No JVD,.  Lungs-  CTAB , good air movement bilaterally  CV- S1, S2 normal, regular Abd-  +ve B.Sounds, Abd Soft, No tenderness,    Extremity/Skin: Improving bilateral ankle edema ,  right arm PICC line site is clean dry and intact Psych-affect is appropriate, oriented x3 Neuro-L Leg 5/5 GU- Foley with clear urine    LABORATORY DATA: CBC: Recent Labs  Lab 04/06/18 0355 04/07/18 0357 04/09/18 0317 04/10/18 0812  WBC 10.6* 9.1 8.9 8.7  HGB 7.2* 8.1* 7.8* 7.8*  HCT 23.8* 27.2* 25.8* 26.7*  MCV 98.8 97.1 97.7 99.3  PLT 328 382 367 384    Basic Metabolic Panel: Recent Labs  Lab 04/06/18 0355 04/07/18 0357 04/10/18 0812  NA 136 137 137  K 3.9 3.6 3.6  CL 96* 97* 99  CO2 34* 31 30  GLUCOSE 210* 289* 245*  BUN 6 8 5*  CREATININE 0.61 0.70 0.72  CALCIUM 7.5* 7.8* 7.9*  MG 1.9 1.7  --     GFR: Estimated Creatinine Clearance: 116.9 mL/min (by C-G formula based on SCr of 0.72 mg/dL).  Liver Function Tests: No results for input(s): AST, ALT, ALKPHOS, BILITOT, PROT, ALBUMIN in the last 168 hours. No results for input(s): LIPASE, AMYLASE in the last 168 hours. No results for input(s): AMMONIA in the last 168 hours.  Coagulation Profile: No results for input(s): INR, PROTIME in the last 168 hours.  Cardiac Enzymes: No results for input(s): CKTOTAL, CKMB, CKMBINDEX, TROPONINI in the last 168 hours.  BNP (last 3 results) No results for input(s): PROBNP in the last 8760 hours.  HbA1C: No results for input(s): HGBA1C in the last 72 hours.  CBG: Recent Labs  Lab 04/11/18 1157  04/11/18 1715 04/11/18 2208 04/12/18 0808 04/12/18 1206  GLUCAP 119* 258* 279* 143* 150*    Lipid Profile: No results for input(s): CHOL, HDL, LDLCALC, TRIG, CHOLHDL, LDLDIRECT in the last 72 hours.  Thyroid Function Tests: No results for input(s): TSH, T4TOTAL, FREET4, T3FREE, THYROIDAB in the last 72 hours.  Anemia Panel: No results for input(s): VITAMINB12, FOLATE, FERRITIN, TIBC, IRON, RETICCTPCT in the last 72 hours.  Urine analysis:    Component Value Date/Time   COLORURINE YELLOW 03/30/2018 1930   APPEARANCEUR CLEAR 03/30/2018 1930   LABSPEC 1.009 03/30/2018 1930   PHURINE 6.0 03/30/2018 1930   GLUCOSEU 50 (A) 03/30/2018 1930   HGBUR SMALL (A) 03/30/2018 1930   BILIRUBINUR NEGATIVE 03/30/2018 1930   BILIRUBINUR small 02/26/2018 1202   KETONESUR NEGATIVE 03/30/2018 1930   PROTEINUR NEGATIVE 03/30/2018 1930   UROBILINOGEN 1.0 02/26/2018 1202   UROBILINOGEN 1.0 12/11/2016 1020   NITRITE NEGATIVE 03/30/2018 1930   LEUKOCYTESUR TRACE (A) 03/30/2018 1930    Sepsis Labs: Lactic Acid, Venous    Component Value Date/Time   LATICACIDVEN 1.2 03/30/2018 1837    MICROBIOLOGY: No results found for this or any previous visit (from the past 240 hour(s)).  RADIOLOGY STUDIES/RESULTS:  Ct Angio Chest Pe W Or Wo Contrast  Result Date: 03/30/2018 CLINICAL DATA:  Shortness of breath PE suspected, high pretest prob. Patient with known epidural abscess and endocarditis. Now with left-sided chest pain. EXAM: CT ANGIOGRAPHY CHEST WITH CONTRAST TECHNIQUE: Multidetector CT imaging of the chest was performed using the standard protocol during bolus administration of intravenous contrast. Multiplanar CT image reconstructions and MIPs were obtained to evaluate the vascular anatomy. CONTRAST:  54mL ISOVUE-370 IOPAMIDOL (ISOVUE-370) INJECTION 76% COMPARISON:  Chest radiographs earlier this day. FINDINGS: Cardiovascular: There are no filling defects within the pulmonary arteries to suggest  pulmonary embolus. The thoracic aorta is normal in caliber without dissection. Mild cardiomegaly. Small amount pericardial fluid. Mediastinum/Nodes: Loculated left pleural effusion tracks along the left aspect of the mediastinum. Small  left hilar nodes without bulky adenopathy. No enlarged mediastinal nodes. No dominant thyroid nodule. The esophagus is decompressed. Lungs/Pleura: Moderate left pleural effusion is loculated and tracks along the posterior, lateral, and medial hemithorax. Adjacent airspace disease in the left lower lobe, favoring compressive atelectasis, with adjacent compressive atelectasis in the right upper lobe. Pleural thickening versus loculated pleural effusion the upper medial right hemithorax, with additional areas of pleural thickening dependently. Dependent atelectasis in the right lower lobe. Trachea and bronchi are patent. Upper Abdomen: No acute findings. Musculoskeletal: Patient with known epidural abscess, not well delineated by CT. No bony destructive change. Review of the MIP images confirms the above findings. IMPRESSION: 1. No pulmonary embolus. 2. Moderate loculated left pleural effusion. Adjacent airspace disease likely compressive atelectasis, pneumonia not excluded in the setting of spinal infection and endocarditis. Mild right pleural thickening or loculated pleural fluid in the upper right hemithorax. Electronically Signed   By: Narda Rutherford M.D.   On: 03/30/2018 23:21   Ct Thoracic Spine Wo Contrast  Result Date: 03/20/2018 CLINICAL DATA:  Progressive back pain.  Swelling of the lower back. EXAM: CT THORACIC SPINE WITHOUT CONTRAST TECHNIQUE: Multidetector CT images of the thoracic were obtained using the standard protocol without intravenous contrast. COMPARISON:  None. FINDINGS: Alignment: Normal. 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 Paraspinal and other soft tissues: There is abnormal gas and fluid in the soft tissues of the right side of the base of the  neck and in the right supraclavicular region. Paraspinal soft tissues appear normal throughout the thoracic spine. Disc levels: There is no evidence of disc protrusion or significant disc bulging or spinal or foraminal stenosis or other significant abnormality of the thoracic spine. IMPRESSION: 1. Evidence of cellulitis involving the right side of the base of the neck extending into the right supraclavicular region with fluid and gas in the soft tissues at the base of the right side of the neck. 2. No significant abnormality of the thoracic spine. 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 Electronically Signed   By: Francene Boyers M.D.   On: 03/20/2018 11:25   Ct Lumbar Spine Wo Contrast  Addendum Date: 03/20/2018   ADDENDUM REPORT: 03/20/2018 11:44 ADDENDUM: Critical Value/emergent results were called by telephone at the time of interpretation on 03/20/2018 at 11:30 am to Dr. Sharyn Creamer , who verbally acknowledged these results. Electronically Signed   By: Francene Boyers M.D.   On: 03/20/2018 11:44   Result Date: 03/20/2018 CLINICAL DATA:  Increasing low back pain and soft tissue swelling. EXAM: CT LUMBAR SPINE WITHOUT CONTRAST TECHNIQUE: Multidetector CT imaging of the lumbar spine was performed without intravenous contrast administration. Multiplanar CT image reconstructions were also generated. IV contrast could not be utilized due to the lack of an appropriate IV. COMPARISON:  None. FINDINGS: Segmentation: 5 lumbar type vertebrae. Alignment: Normal. Vertebrae: There is a moth-eaten appearance of the spinous processes of L3 and L4 which is worrisome for osteomyelitis. Bilateral pars defects at L5 with grade 1 spondylolisthesis. 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 Paraspinal and other soft tissues: There is an extensive abnormal fluid collection in the subcutaneous soft tissues of the posterior aspect of the back extending from approximately L1-2 to S3. This fluid collection is lobulated and measures  approximately 20 x 9 x 2.5 cm. It is centered slightly to the left of midline and has a mass effect upon the adjacent posterior paraspinal muscles. There is abnormal lucency in the underlying paraspinal muscles which could represent myositis. Disc levels: T11-12: No  significant abnormality. Butterfly T12 vertebra. T12-L1: No significant abnormality. L1-2: Normal disc. Abnormal edema in the posterior paraspinal musculature with adjacent fluid collection in the subcutaneous fat of the posterior aspect of the back as described above. L2-3: Normal disc. L3-4: Normal disc. Lucency in the posterior paraspinal soft tissues extends to the posterior aspect of the thecal sac on image 80 of series 4 but there is no discrete epidural abscess. L4-5: Normal disc.  No evidence of epidural abscess. L5-S1: Grade 1 spondylolisthesis. No disc bulging or protrusion. Bilateral pars defects. No visible epidural abscess. IMPRESSION: 1. Extensive abnormal fluid collection in the subcutaneous fat of the midline of the back with underlying marked abnormality of the posterior paraspinal musculature from L1-2 through S3. This is worrisome for subcutaneous abscess and myositis. 2. Moth-eaten appearance of the spinous processes of L3 and L4 consistent with osteomyelitis. 3. No discrete epidural abscess. However, the abnormal edema in the paraspinal musculature extends to the posterior aspect of the spinal canal at L3-4. 4. MRI with and without contrast may better define the extent of the soft tissue and infection and could detect epidural extension that is not apparent on this unenhanced CT scan. Electronically Signed: By: Francene Boyers M.D. On: 03/20/2018 11:18   Mr Cervical Spine Wo Contrast  Result Date: 03/30/2018 CLINICAL DATA:  42 year old male with history of MSSA bacteremia with known endocarditis and epidural abscess extending from C2 through the sacrum. Recent episode of hypotension and acute lower extremity weakness. Evaluate  abscess and possible spinal cord infarct. EXAM: MRI CERVICAL, THORACIC SPINE WITHOUT CONTRAST TECHNIQUE: Multiplanar and multiecho pulse sequences of the cervical spine, to include the craniocervical junction and cervicothoracic junction, and thoracic and lumbar spine, were obtained without intravenous contrast. COMPARISON:  Prior MRI from 03/22/2018. FINDINGS: MRI CERVICAL SPINE FINDINGS Alignment: Examination technically limited as the patient was unable to tolerate the full length of the exam. Additionally, images provided are degraded by motion artifact. Reversal of the normal cervical lordosis with apex at C5, stable. No interval listhesis or malalignment. Vertebrae: Vertebral body height maintained without acute or interval fracture. Bone marrow signal intensity diffusely decreased on T1 weighted imaging, suspected to be related to anemia and chronic disease. No discrete osseous lesions. No abnormal marrow edema. No evidence for interval discitis. Cord: Signal intensity within the cervical spinal cord is within normal limits. No findings to suggest interval cord infarction on this motion degraded and limited exam. Previously identified epidural collection involving the ventral epidural space extending from C2 inferiorly is decreased in size, now measuring up to 7 mm in maximal AP diameter at the level of C2. Collection diffusely involves the ventral epidural space, but also is seen dorsally as well. Slightly improved diffuse spinal stenosis with thecal sac patency. Posterior Fossa, vertebral arteries, paraspinal tissues: Visualized brain and posterior fossa within normal limits. Craniocervical junction normal. Scattered edema within the posterior paraspinous soft tissues. Normal intravascular flow voids seen within the vertebral arteries bilaterally. Disc levels: C2-C3: Unremarkable. C3-C4: Small left foraminal protrusion with associated moderate left C4 foraminal stenosis. C4-C5:  Unremarkable. C5-C6: Left  eccentric disc bulge with uncovertebral hypertrophy. Moderate left C6 foraminal narrowing. C6-C7: Right foraminal disc protrusion with associated moderate right C7 foraminal stenosis. C7-T1:  Unremarkable. MRI THORACIC SPINE FINDINGS Alignment: Examination markedly limited as the patient was unable to tolerate the full length of the exam. Sagittal T1, T2, and STIR sequences only were performed. No axial images obtained. Vertebral bodies normally aligned with preservation of the normal thoracic  kyphosis. Vertebrae: Vertebral body height maintained without evidence for interval fracture. Butterfly vertebra noted at T12. Diffusely decreased T1 weighted signal intensity throughout the visualized bone marrow, like related to anemia chronic disease. No findings to suggest interval or new discitis or septic arthritis. Cord: Signal intensity within the thoracic spinal cord grossly within normal limits on these limited sagittal views. No definite cord signal abnormality or cord edema to suggest acute spinal cord infarction. Previously seen diffuse epidural collection appears overall decreased in size from previous, with improved spinal stenosis and thecal sac patency. Paraspinal and other soft tissues: Scattered edema seen within the posterior paraspinous soft tissues at the upper back/cervicothoracic junction. No discrete soft tissue collections. Disc levels: T5-6: Central disc protrusion indenting upon the ventral thoracic spinal cord, stable. IMPRESSION: 1. Technically limited exam due to motion artifact and the patient's inability to tolerate the full length of the exam. Cervical and thoracic spine only was imaged, and only sagittal sequences of the thoracic spine were obtained. 2. No imaging findings to suggest spinal cord infarction identified on this limited exam. 3. Interval improvement in diffuse epidural collection, decreased in size as compared to previous exam with improved diffuse spinal stenosis and thecal sac  patency. No new discitis or facet arthritis. Electronically Signed   By: Rise Mu M.D.   On: 03/30/2018 22:52   Mr Thoracic Spine Wo Contrast  Result Date: 03/30/2018 CLINICAL DATA:  42 year old male with history of MSSA bacteremia with known endocarditis and epidural abscess extending from C2 through the sacrum. Recent episode of hypotension and acute lower extremity weakness. Evaluate abscess and possible spinal cord infarct. EXAM: MRI CERVICAL, THORACIC SPINE WITHOUT CONTRAST TECHNIQUE: Multiplanar and multiecho pulse sequences of the cervical spine, to include the craniocervical junction and cervicothoracic junction, and thoracic and lumbar spine, were obtained without intravenous contrast. COMPARISON:  Prior MRI from 03/22/2018. FINDINGS: MRI CERVICAL SPINE FINDINGS Alignment: Examination technically limited as the patient was unable to tolerate the full length of the exam. Additionally, images provided are degraded by motion artifact. Reversal of the normal cervical lordosis with apex at C5, stable. No interval listhesis or malalignment. Vertebrae: Vertebral body height maintained without acute or interval fracture. Bone marrow signal intensity diffusely decreased on T1 weighted imaging, suspected to be related to anemia and chronic disease. No discrete osseous lesions. No abnormal marrow edema. No evidence for interval discitis. Cord: Signal intensity within the cervical spinal cord is within normal limits. No findings to suggest interval cord infarction on this motion degraded and limited exam. Previously identified epidural collection involving the ventral epidural space extending from C2 inferiorly is decreased in size, now measuring up to 7 mm in maximal AP diameter at the level of C2. Collection diffusely involves the ventral epidural space, but also is seen dorsally as well. Slightly improved diffuse spinal stenosis with thecal sac patency. Posterior Fossa, vertebral arteries, paraspinal  tissues: Visualized brain and posterior fossa within normal limits. Craniocervical junction normal. Scattered edema within the posterior paraspinous soft tissues. Normal intravascular flow voids seen within the vertebral arteries bilaterally. Disc levels: C2-C3: Unremarkable. C3-C4: Small left foraminal protrusion with associated moderate left C4 foraminal stenosis. C4-C5:  Unremarkable. C5-C6: Left eccentric disc bulge with uncovertebral hypertrophy. Moderate left C6 foraminal narrowing. C6-C7: Right foraminal disc protrusion with associated moderate right C7 foraminal stenosis. C7-T1:  Unremarkable. MRI THORACIC SPINE FINDINGS Alignment: Examination markedly limited as the patient was unable to tolerate the full length of the exam. Sagittal T1, T2, and STIR sequences only  were performed. No axial images obtained. Vertebral bodies normally aligned with preservation of the normal thoracic kyphosis. Vertebrae: Vertebral body height maintained without evidence for interval fracture. Butterfly vertebra noted at T12. Diffusely decreased T1 weighted signal intensity throughout the visualized bone marrow, like related to anemia chronic disease. No findings to suggest interval or new discitis or septic arthritis. Cord: Signal intensity within the thoracic spinal cord grossly within normal limits on these limited sagittal views. No definite cord signal abnormality or cord edema to suggest acute spinal cord infarction. Previously seen diffuse epidural collection appears overall decreased in size from previous, with improved spinal stenosis and thecal sac patency. Paraspinal and other soft tissues: Scattered edema seen within the posterior paraspinous soft tissues at the upper back/cervicothoracic junction. No discrete soft tissue collections. Disc levels: T5-6: Central disc protrusion indenting upon the ventral thoracic spinal cord, stable. IMPRESSION: 1. Technically limited exam due to motion artifact and the patient's  inability to tolerate the full length of the exam. Cervical and thoracic spine only was imaged, and only sagittal sequences of the thoracic spine were obtained. 2. No imaging findings to suggest spinal cord infarction identified on this limited exam. 3. Interval improvement in diffuse epidural collection, decreased in size as compared to previous exam with improved diffuse spinal stenosis and thecal sac patency. No new discitis or facet arthritis. Electronically Signed   By: Rise Mu M.D.   On: 03/30/2018 22:52   Mr Cervical Spine W Wo Contrast  Result Date: 03/22/2018 CLINICAL DATA:  Spine infection. EXAM: MRI TOTAL SPINE WITHOUT AND WITH CONTRAST TECHNIQUE: Multisequence MR imaging of the spine from the cervical spine to the sacrum was performed prior to and following IV contrast administration. CONTRAST:  6 cc Gadavist intravenous COMPARISON:  CT of the thoracic and lumbar spine from 2 days ago FINDINGS: MRI CERVICAL SPINE FINDINGS Alignment: Normal Vertebrae: No evidence of osseous infection. Canal/Cord: There is extensive spinal fluid collection preferentially in the ventral but also in the right more than left dorsal canal. Subarachnoid space is diffusely effaced. Maximal thickness is posterior to C2 at 9 mm. No cord signal abnormality. Posterior Fossa, vertebral arteries, paraspinal tissues: No retropharyngeal or other discrete soft tissue collection. Disc levels: C2-3: Unremarkable. C3-4: Small left foraminal protrusion with moderate narrowing C4-5: Unremarkable. C5-6: Disc narrowing and bulging with asymmetric left uncovertebral spurring. Left foraminal impingement C6-7: Right foraminal protrusion mild narrowing. C7-T1:Unremarkable. MRI THORACIC SPINE FINDINGS Alignment:  Normal Vertebrae: No evidence of osteomyelitis or discitis. T12 butterfly vertebra. Canal/Cord: Cervical ventral epidural collection continues throughout the thoracic levels. There is also a focal dorsal component at T3-4  to T5-6, where thecal sac effacement is accentuated and there is cord flattening. No cord edema. Paraspinal and other soft tissues: Paraspinous phlegmon on the left at T8-T12, with new complex left pleural effusion and lower lobe atelectasis Disc levels: T5-6 central disc protrusion. MRI LUMBAR SPINE FINDINGS Segmentation:  5 lumbar type vertebral bodies Alignment:  Grade 1 anterolisthesis at L5-S1. Vertebrae: Marrow edema and heterogeneous enhancement within the L2, L3, and L4 spinous processes. No discitis or facet edema. Conus medullaris: Extends to the L1 level and is non edematous. There is extensive epidural collection completely effacing the thecal sac throughout the lumbar spine until L4-5 and below where the collection becomes ventral and right eccentric. The infection communicates with extensive bilateral abscess within the intrinsic back muscles via the interspinous space at L3-4. Patient had recent subcutaneous collection and left buttocks collection drainage, with packing seen in  place. This midline, upper subcutaneous collection communicates with the paravertebral abscess along its superior margin based on postcontrast axial images. Paraspinal and other soft tissues: As above.  Distended bladder Disc levels: Chronic bilateral pars defects at L5. Critical Value/emergent results were called by telephone at the time of interpretation on 03/22/2018 at 3:04 pm to Dr. Thedore Mins , who verbally acknowledged these results. IMPRESSION: 1. Epidural abscess from C2 to sacrum as described. Maximal cord compression from T3-4 to T5-6. The thecal sac is completely effaced from L1 to L4-5. At the L3-4 interspinous space the spinal abscess communicates with large bilateral abscesses within the intrinsic back muscles. There is osteomyelitis of the L2, L3, and L4 spinous processes. No discitis or facet arthritis. 2. Left paravertebral abscess along the lower thoracic spine with small left empyema that is new from CT 2 days  ago. 3. Distended bladder Electronically Signed   By: Marnee Spring M.D.   On: 03/22/2018 15:11   Mr Thoracic Spine W Wo Contrast  Result Date: 03/22/2018 CLINICAL DATA:  Spine infection. EXAM: MRI TOTAL SPINE WITHOUT AND WITH CONTRAST TECHNIQUE: Multisequence MR imaging of the spine from the cervical spine to the sacrum was performed prior to and following IV contrast administration. CONTRAST:  6 cc Gadavist intravenous COMPARISON:  CT of the thoracic and lumbar spine from 2 days ago FINDINGS: MRI CERVICAL SPINE FINDINGS Alignment: Normal Vertebrae: No evidence of osseous infection. Canal/Cord: There is extensive spinal fluid collection preferentially in the ventral but also in the right more than left dorsal canal. Subarachnoid space is diffusely effaced. Maximal thickness is posterior to C2 at 9 mm. No cord signal abnormality. Posterior Fossa, vertebral arteries, paraspinal tissues: No retropharyngeal or other discrete soft tissue collection. Disc levels: C2-3: Unremarkable. C3-4: Small left foraminal protrusion with moderate narrowing C4-5: Unremarkable. C5-6: Disc narrowing and bulging with asymmetric left uncovertebral spurring. Left foraminal impingement C6-7: Right foraminal protrusion mild narrowing. C7-T1:Unremarkable. MRI THORACIC SPINE FINDINGS Alignment:  Normal Vertebrae: No evidence of osteomyelitis or discitis. T12 butterfly vertebra. Canal/Cord: Cervical ventral epidural collection continues throughout the thoracic levels. There is also a focal dorsal component at T3-4 to T5-6, where thecal sac effacement is accentuated and there is cord flattening. No cord edema. Paraspinal and other soft tissues: Paraspinous phlegmon on the left at T8-T12, with new complex left pleural effusion and lower lobe atelectasis Disc levels: T5-6 central disc protrusion. MRI LUMBAR SPINE FINDINGS Segmentation:  5 lumbar type vertebral bodies Alignment:  Grade 1 anterolisthesis at L5-S1. Vertebrae: Marrow edema and  heterogeneous enhancement within the L2, L3, and L4 spinous processes. No discitis or facet edema. Conus medullaris: Extends to the L1 level and is non edematous. There is extensive epidural collection completely effacing the thecal sac throughout the lumbar spine until L4-5 and below where the collection becomes ventral and right eccentric. The infection communicates with extensive bilateral abscess within the intrinsic back muscles via the interspinous space at L3-4. Patient had recent subcutaneous collection and left buttocks collection drainage, with packing seen in place. This midline, upper subcutaneous collection communicates with the paravertebral abscess along its superior margin based on postcontrast axial images. Paraspinal and other soft tissues: As above.  Distended bladder Disc levels: Chronic bilateral pars defects at L5. Critical Value/emergent results were called by telephone at the time of interpretation on 03/22/2018 at 3:04 pm to Dr. Thedore Mins , who verbally acknowledged these results. IMPRESSION: 1. Epidural abscess from C2 to sacrum as described. Maximal cord compression from T3-4 to T5-6. The  thecal sac is completely effaced from L1 to L4-5. At the L3-4 interspinous space the spinal abscess communicates with large bilateral abscesses within the intrinsic back muscles. There is osteomyelitis of the L2, L3, and L4 spinous processes. No discitis or facet arthritis. 2. Left paravertebral abscess along the lower thoracic spine with small left empyema that is new from CT 2 days ago. 3. Distended bladder Electronically Signed   By: Marnee Spring M.D.   On: 03/22/2018 15:11   Mr Lumbar Spine W Wo Contrast  Result Date: 03/22/2018 CLINICAL DATA:  Spine infection. EXAM: MRI TOTAL SPINE WITHOUT AND WITH CONTRAST TECHNIQUE: Multisequence MR imaging of the spine from the cervical spine to the sacrum was performed prior to and following IV contrast administration. CONTRAST:  6 cc Gadavist intravenous  COMPARISON:  CT of the thoracic and lumbar spine from 2 days ago FINDINGS: MRI CERVICAL SPINE FINDINGS Alignment: Normal Vertebrae: No evidence of osseous infection. Canal/Cord: There is extensive spinal fluid collection preferentially in the ventral but also in the right more than left dorsal canal. Subarachnoid space is diffusely effaced. Maximal thickness is posterior to C2 at 9 mm. No cord signal abnormality. Posterior Fossa, vertebral arteries, paraspinal tissues: No retropharyngeal or other discrete soft tissue collection. Disc levels: C2-3: Unremarkable. C3-4: Small left foraminal protrusion with moderate narrowing C4-5: Unremarkable. C5-6: Disc narrowing and bulging with asymmetric left uncovertebral spurring. Left foraminal impingement C6-7: Right foraminal protrusion mild narrowing. C7-T1:Unremarkable. MRI THORACIC SPINE FINDINGS Alignment:  Normal Vertebrae: No evidence of osteomyelitis or discitis. T12 butterfly vertebra. Canal/Cord: Cervical ventral epidural collection continues throughout the thoracic levels. There is also a focal dorsal component at T3-4 to T5-6, where thecal sac effacement is accentuated and there is cord flattening. No cord edema. Paraspinal and other soft tissues: Paraspinous phlegmon on the left at T8-T12, with new complex left pleural effusion and lower lobe atelectasis Disc levels: T5-6 central disc protrusion. MRI LUMBAR SPINE FINDINGS Segmentation:  5 lumbar type vertebral bodies Alignment:  Grade 1 anterolisthesis at L5-S1. Vertebrae: Marrow edema and heterogeneous enhancement within the L2, L3, and L4 spinous processes. No discitis or facet edema. Conus medullaris: Extends to the L1 level and is non edematous. There is extensive epidural collection completely effacing the thecal sac throughout the lumbar spine until L4-5 and below where the collection becomes ventral and right eccentric. The infection communicates with extensive bilateral abscess within the intrinsic back  muscles via the interspinous space at L3-4. Patient had recent subcutaneous collection and left buttocks collection drainage, with packing seen in place. This midline, upper subcutaneous collection communicates with the paravertebral abscess along its superior margin based on postcontrast axial images. Paraspinal and other soft tissues: As above.  Distended bladder Disc levels: Chronic bilateral pars defects at L5. Critical Value/emergent results were called by telephone at the time of interpretation on 03/22/2018 at 3:04 pm to Dr. Thedore Mins , who verbally acknowledged these results. IMPRESSION: 1. Epidural abscess from C2 to sacrum as described. Maximal cord compression from T3-4 to T5-6. The thecal sac is completely effaced from L1 to L4-5. At the L3-4 interspinous space the spinal abscess communicates with large bilateral abscesses within the intrinsic back muscles. There is osteomyelitis of the L2, L3, and L4 spinous processes. No discitis or facet arthritis. 2. Left paravertebral abscess along the lower thoracic spine with small left empyema that is new from CT 2 days ago. 3. Distended bladder Electronically Signed   By: Marnee Spring M.D.   On: 03/22/2018 15:11  Dg Chest Port 1 View  Result Date: 03/30/2018 CLINICAL DATA:  Shortness of breath EXAM: PORTABLE CHEST 1 VIEW COMPARISON:  03/30/2018 at 0227 hours FINDINGS: Moderate layering left pleural effusion, increased. Left lower lobe opacity, atelectasis versus pneumonia. Right lung is clear.  No pneumothorax. The heart is normal in size. IMPRESSION: Moderate layering left pleural effusion, increased. Left lower lobe opacity, atelectasis versus pneumonia. Electronically Signed   By: Charline Bills M.D.   On: 03/30/2018 19:06   Dg Chest Port 1 View  Result Date: 03/30/2018 CLINICAL DATA:  Chest pain EXAM: PORTABLE CHEST 1 VIEW COMPARISON:  None. FINDINGS: Retrocardiac opacity, atelectasis versus pneumonia. Possible small left pleural effusion. Right  lung is clear. No pneumothorax. The heart is normal in size. IMPRESSION: Retrocardiac opacity, atelectasis versus pneumonia. Possible small left pleural effusion. Electronically Signed   By: Charline Bills M.D.   On: 03/30/2018 02:49   [place] Ekg Site Rite  Result Date: 04/04/2018 If Site Rite image not attached, placement could not be confirmed due to current cardiac rhythm.  [place] Ekg Site Rite  Result Date: 03/21/2018 If Site Rite image not attached, placement could not be confirmed due to current cardiac rhythm.    LOS: 23 days   Signature  Shon Hale M.D on 04/12/2018 at 3:12 PM  To page go to [email or web address] - password [email or web address]

## 2018-04-12 NOTE — Progress Notes (Signed)
Inpatient Diabetes Program Recommendations  AACE/ADA: New Consensus Statement on Inpatient Glycemic Control (2015)  Target Ranges:  Prepandial:   less than 140 mg/dL      Peak postprandial:   less than 180 mg/dL (1-2 hours)      Critically ill patients:  140 - 180 mg/dL   Lab Results  Component Value Date   GLUCAP 143 (H) 04/12/2018   HGBA1C 12.6 (A) 02/26/2018    Review of Glycemic Control Results for Tony Long, Tony Long (MRN [id number]) as of 04/12/2018 11:23  Ref. Range 04/11/2018 11:57 04/11/2018 17:15 04/11/2018 22:08 04/12/2018 08:08  Glucose-Capillary Latest Ref Range: 70 - 99 mg/dL 086 (H) 578 (H) 469 (H) 143 (H)   Diabetes history:DM Outpatient Diabetes medications: Lantus 40 units q HS, Humalog 10 units tid with meals, Metformin 500 mg bid Current orders for Inpatient glycemic control: Novolog sensitive tid with meals and HS, Lantus 25 units q HS, Novolog 4 units tid with meals  Inpatient Diabetes Program Recommendations:    Noted increased post prandials exceeding 180 mg/dL. Consider increasing meal coverage to Novolog 6 units TID (assuming patient is consuming >50% of meals).   Thanks, Lujean Rave, MSN, RNC-OB Diabetes Coordinator [phone] (8a-5p)

## 2018-04-12 NOTE — Progress Notes (Addendum)
Subjective: Patient reports "I'm hanging in there"  Objective: Vital signs in last 24 hours: Temp:  [98.8 F (37.1 C)-99.7 F (37.6 C)] 99.7 F (37.6 C) (10/14 0625) Pulse Rate:  [88-95] 88 (10/14 0625) Resp:  [16] 16 (10/14 0625) BP: (120-137)/(74-79) 137/79 (10/14 0625) SpO2:  [93 %] 93 % (10/14 0625)  Intake/Output from previous day: 10/13 0701 - 10/14 0700 In: 1540 [P.O.:840; IV Piggyback:700] Out: 2750 [Urine:2750] Intake/Output this shift: No intake/output data recorded.  Walking in room. Reports no pain at present. Neuro exam unchanged. Good strength all extremities including hand intrinsics. Lumbar wounds managed by wound therapy. IVAB continue.   Lab Results: Recent Labs    04/10/18 0812  WBC 8.7  HGB 7.8*  HCT 26.7*  PLT 384   BMET Recent Labs    04/10/18 0812  NA 137  K 3.6  CL 99  CO2 30  GLUCOSE 245*  BUN 5*  CREATININE 0.72  CALCIUM 7.9*    Studies/Results: No results found.  Assessment/Plan:   LOS: 23 days  supportive care continues   Georgiann Cocker 04/12/2018, 8:14 AM   [name] gradually improving.

## 2018-04-13 LAB — GLUCOSE, CAPILLARY
GLUCOSE-CAPILLARY: 202 mg/dL — AB (ref 70–99)
GLUCOSE-CAPILLARY: 277 mg/dL — AB (ref 70–99)
Glucose-Capillary: 132 mg/dL — ABNORMAL HIGH (ref 70–99)
Glucose-Capillary: 86 mg/dL (ref 70–99)
Glucose-Capillary: 238 mg/dL — ABNORMAL HIGH (ref 70–99)

## 2018-04-13 LAB — CBC
HCT: 27.7 % — ABNORMAL LOW (ref 39.0–52.0)
Hemoglobin: 8.5 g/dL — ABNORMAL LOW (ref 13.0–17.0)
MCH: 29.2 pg (ref 26.0–34.0)
MCHC: 30.7 g/dL (ref 30.0–36.0)
MCV: 95.2 fL (ref 80.0–100.0)
NRBC: 0 % (ref 0.0–0.2)
PLATELETS: 486 10*3/uL — AB (ref 150–400)
RBC: 2.91 MIL/uL — AB (ref 4.22–5.81)
RDW: 13.3 % (ref 11.5–15.5)
WBC: 8.5 10*3/uL (ref 4.0–10.5)

## 2018-04-13 LAB — BASIC METABOLIC PANEL
ANION GAP: 8 (ref 5–15)
BUN: 6 mg/dL (ref 6–20)
CO2: 28 mmol/L (ref 22–32)
Calcium: 8.3 mg/dL — ABNORMAL LOW (ref 8.9–10.3)
Chloride: 103 mmol/L (ref 98–111)
Creatinine, Ser: 0.65 mg/dL (ref 0.61–1.24)
GFR calc Af Amer: >60 mL/min (ref >60)
GLUCOSE: 145 mg/dL — AB (ref 70–99)
POTASSIUM: 3.4 mmol/L — AB (ref 3.5–5.1)
Sodium: 139 mmol/L (ref 135–145)

## 2018-04-13 MED ORDER — POTASSIUM CHLORIDE CRYS ER 20 MEQ PO TBCR
40.0000 meq | EXTENDED_RELEASE_TABLET | Freq: Once | ORAL | Status: AC
  Administered 2018-04-13: 40 meq via ORAL
  Filled 2018-04-13: qty 2

## 2018-04-13 NOTE — Progress Notes (Signed)
PROGRESS NOTE        PATIENT DETAILS  Name: Tony Long  Age: 42 y.o.  Sex: male Date of Birth: 21-Jul-1975 Admit Date: 03/20/2018 Admitting Physician Kendell Bane, MD  ZOX:[id number], Rolm Gala, FNP   Brief Narrative:  [name] a 42 y.o. male history of IVDA, DM-2, hypertension admitted for worsening back pain-further evaluation revealed MSSA bacteremia with tricuspid valve endocarditis, epidural abscess involving C2 through sacrum, and numerous soft tissue back abscesses.  Evaluated by general surgery-underwent I&D of her lower back soft tissue abscess, evaluated by neurosurgery-not felt to be a candidate for decompressive surgery due to extensive nature of the disease and lack of any significant neurological findings.  ID following He was initially kept on IV cefazolin and then antibiotic coverage was broadened to Vancomycin and Rocephin on 03/30/2018 by ID.   Currently on IV nafcillin on 04/16/2018, thereafter 4 weeks of Keflex 500 p.o. twice daily per ID.Marland Kitchen   See below for further details   Subjective:  Patient brother and mother at bedside, questions answered, no fevers no chills, eating and drinking well, Foley in situ  Assessment/Plan:   MSSA Bacteremia with tricuspid valve endocarditis, extensive epidural abscess (C2 through sacrum) and large soft tissue lower back abscess  : due to extensive nature of the epidural abscess and lack of concerning neurological findings-neurosurgery does not recommend decompressive surgery. Seen by ID and current recommendation noted, he was initially kept on IV cefazolin and then antibiotic coverage was broadened to Vancomycin and Rocephin on 03/30/2018 by ID.   Currently on IV nafcillin until 04/16/2018, thereafter 4 weeks of Keflex 500 p.o. twice daily per ID.. D/w Dr Jolayne Haines on 04/09/18, he confirms antibiotic plan above  Given history of IVDA-not a candidate for outpatient IV Abx therapy. He has received a PICC line,  continue IV antibiotics through PICC Line.  Patient doing fairly well,       Cocaine/Substance abuse: Withdrawal symptoms have resolved, continue  Suboxone, he does not want short-acting pain medications anymore,    Polysubstance abuse/IVDA: Counseled to quit.  Anemia.  Normocytic, he had some chronic anemia upon admission as well and now worse due to him dilution from IV fluids also some element of iron deficiency per anemia panel likely due to multiple blood draws.  No signs of ongoing bleeding or acute blood loss, s/p  1 unit of packed RBC on 04/06/2018 and monitor, hgb is stable, will check CBCs on Tuesdays Thursdays and Saturdays  Mild lower extremity leg edema.  ---improving with TED stockings    New onset of left-sided pleuritic chest pain starting on late night 03/29/2018.  Nonspecific EKG changes stable on combination of aspirin, beta-blocker and statin.  Echocardiogram shows no wall motion abnormality and preserved EF, patient remains chest pain-free Hypertension: Monitor on beta-blocker.  Acute urinary retention: Continue Foley and Flomax, trial of Foley removal failed on 04/03/2018, likely combination of lack of activity and spine infection, failed voiding trial again on 04/09/2018, Foley reinserted and 1450 mL of urine removed, ,will need outpatient follow-up with urology discharge  Severe hypokalemia and hypomagnesemia. -Replace and recheck, recheck BMP on Tuesdays Thursdays and Saturdays  DM-2: Poor outpatient control, last A1c 12.6, continue Lantus 25 units nightly and sliding scale.   CBG (last 3)  Recent Labs    04/13/18 0746 04/13/18 1206 04/13/18 1703  GLUCAP 132*  86 277*   Lab Results  Component Value Date   HGBA1C 12.6 (A) 02/26/2018     DVT Prophylaxis: Prophylactic heparin  Code Status: Full code  Disposition Plan: Home after completing IV antibiotics ...04/16/18 or 04/17/18  Antimicrobial agents: Anti-infectives (From admission, onward)   Start      Dose/Rate Route Frequency Ordered Stop   04/01/18 1300  nafcillin 2 g in sodium chloride 0.9 % 100 mL IVPB     2 g 200 mL/hr over 30 Minutes Intravenous Every 4 hours 04/01/18 1242     04/01/18 1200  nafcillin injection 2 g  Status:  Discontinued     2 g Intravenous Every 4 hours 04/01/18 1129 04/01/18 1242   03/31/18 0200  vancomycin (VANCOCIN) IVPB 750 mg/150 ml premix  Status:  Discontinued     750 mg 150 mL/hr over 60 Minutes Intravenous Every 8 hours 03/30/18 1628 04/01/18 1129   03/31/18 0100  meropenem (MERREM) 1 g in sodium chloride 0.9 % 100 mL IVPB  Status:  Discontinued     1 g 200 mL/hr over 30 Minutes Intravenous Every 8 hours 03/30/18 1628 04/01/18 1129   03/30/18 1630  vancomycin (VANCOCIN) 1,500 mg in sodium chloride 0.9 % 500 mL IVPB     1,500 mg 250 mL/hr over 120 Minutes Intravenous NOW 03/30/18 1619 03/30/18 1925   03/30/18 1630  meropenem (MERREM) 2 g in sodium chloride 0.9 % 100 mL IVPB     2 g 200 mL/hr over 30 Minutes Intravenous NOW 03/30/18 1619 03/30/18 1754   03/21/18 0930  ceFAZolin (ANCEF) IVPB 2g/100 mL premix  Status:  Discontinued     2 g 200 mL/hr over 30 Minutes Intravenous Every 8 hours 03/21/18 0920 03/30/18 1616      Procedures:   MRI C, T and L-spine done 03/30/2018 and 03/31/2018.    CT angiogram chest 03/30/2018.  1. No pulmonary embolus. 2. Moderate loculated left pleural effusion. Adjacent airspace disease likely compressive atelectasis, pneumonia not excluded in the setting of spinal infection and endocarditis. Mild right pleural thickening or loculated pleural fluid in the upper right hemithorax.  Repeat echocardiogram 03/30/2018 - Left ventricle: The cavity size was normal. Wall thickness was normal. Systolic function was normal. The estimated ejection fraction was in the range of 60% to 65%. Wall motion was normal; there were no regional wall motion abnormalities. Left ventricular diastolic function parameters were normal.  TEE - - Left  ventricle: The cavity size was normal. Wall thickness wasnormal. Systolic function was normal. The estimated ejectionfraction was in the range of 60% to 65%. - Aortic valve: No evidence of vegetation. - Mitral valve: No evidence of vegetation. - Left atrium: No evidence of thrombus in the atrial cavity or appendage. No evidence of thrombus in the appendage. - Right atrium: No evidence of thrombus in the atrial cavity orappendage. - Tricuspid valve: There was a vegetation. There was a small (1.2cmx .6cm), mobile vegetation on the septal leaflet. There was mildregurgitation. - Pulmonic valve: No evidence of vegetation.  Impressions:   Tricuspid valve endocarditis. Discussed with primary team   MRI C-T-L Spine - 1. Epidural abscess from C2 to sacrum as described. Maximal cord compression from T3-4 to T5-6. The thecal sac is completely effaced from L1 to L4-5. At the L3-4 interspinous space the spinal abscess communicates with large bilateral abscesses within the intrinsic back muscles. There is osteomyelitis of the L2, L3, and L4 spinous processes. No discitis or facet arthritis. 2. Left paravertebral abscess  along the lower thoracic spine with small left empyema that is new from CT 2 days ago. 3. Distended bladder  CT - 1. Evidence of cellulitis involving the right side of the base of the neck extending into the right supraclavicular region with fluid and gas in the soft tissues at the base of the right side of the neck. 2. No significant abnormality of the thoracic spine. 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  CT -  1. Extensive abnormal fluid collection in the subcutaneous fat of the midline of the back with underlying marked abnormality of the posterior paraspinal musculature from L1-2 through S3. This is worrisome for subcutaneous abscess and myositis. 2. Moth-eaten appearance of the spinous processes of L3 and L4 consistent with osteomyelitis. 3. No discrete epidural abscess.  However, the abnormal edema in the paraspinal musculature extends to the posterior aspect of the spinal canal at L3-4. 4. MRI with and without contrast may better define the extent of the soft tissue and infection and could detect epidural extension that is not apparent on this unenhanced CT scan.  9/22>> PICC line  I&D by [facility] of back soft tissue abscess on -  03/22/18  CONSULTS:  N.Surgery, ID, general surgery, PCCM    MEDICATIONS:  Scheduled Meds:  . aspirin  81 mg Oral Daily  . atorvastatin  40 mg Oral q1800  . buprenorphine-naloxone  2 tablet Sublingual Daily  . feeding supplement (PRO-STAT SUGAR FREE 64)  30 mL Oral TID WC  . gabapentin  300 mg Oral TID  . heparin injection (subcutaneous)  5,000 Units Subcutaneous Q8H  . insulin aspart  0-5 Units Subcutaneous QHS  . insulin aspart  0-9 Units Subcutaneous TID WC  . insulin aspart  4 Units Subcutaneous TID WC  . insulin glargine  25 Units Subcutaneous QHS  . metoprolol tartrate  50 mg Oral BID  . nicotine  21 mg Transdermal Daily  . polyethylene glycol  17 g Oral BID  . potassium chloride  40 mEq Oral Once  . senna-docusate  2 tablet Oral QHS  . tamsulosin  0.4 mg Oral BID   Continuous Infusions: . nafcillin IV 2 g (04/13/18 1634)   PRN Meds:.acetaminophen, bisacodyl, clonazepam, cyclobenzaprine, hydrALAZINE, LORazepam, magnesium hydroxide, metoprolol tartrate, nitroGLYCERIN, sodium chloride flush, sodium phosphate   PHYSICAL EXAM: Vital signs: Vitals:   04/12/18 1441 04/12/18 2140 04/13/18 0544 04/13/18 1428  BP: 96/61 117/72 122/78 101/65  Pulse: 84 98 84 87  Resp: 16 18 18    Temp: 99 F (37.2 C) 99 F (37.2 C) 99.4 F (37.4 C) 99.4 F (37.4 C)  TempSrc: Oral   Oral  SpO2: 97% 94% 93% 96%  Weight:      Height:       Filed Weights   03/21/18 0602  Weight: 68 kg   Body mass index is 20.92 kg/m.   Exam  Physical Exam  [name] examined daily including today on 04/13/18 , exams remain the same as of  yesterday except that has changed   Gen:- Awake Alert, in no acute distress, pleasant HEENT:- [place].AT, No sclera icterus Neck-Supple Neck,No JVD,.  Lungs-  CTAB , good air movement bilaterally  CV- S1, S2 normal, regular Abd-  +ve B.Sounds, Abd Soft, No tenderness,    Extremity/Skin: Improving bilateral ankle edema , right arm PICC line site is clean dry and intact Psych-affect is appropriate, oriented x3 Neuro-L Leg 5/5 GU- Foley with clear urine    LABORATORY DATA: CBC: Recent Labs  Lab 04/07/18  1610 04/09/18 0317 04/10/18 0812 04/13/18 0814  WBC 9.1 8.9 8.7 8.5  HGB 8.1* 7.8* 7.8* 8.5*  HCT 27.2* 25.8* 26.7* 27.7*  MCV 97.1 97.7 99.3 95.2  PLT 382 367 384 486*    Basic Metabolic Panel: Recent Labs  Lab 04/07/18 0357 04/10/18 0812 04/13/18 0814  NA 137 137 139  K 3.6 3.6 3.4*  CL 97* 99 103  CO2 31 30 28   GLUCOSE 289* 245* 145*  BUN 8 5* 6  CREATININE 0.70 0.72 0.65  CALCIUM 7.8* 7.9* 8.3*  MG 1.7  --   --     GFR: Estimated Creatinine Clearance: 116.9 mL/min (by C-G formula based on SCr of 0.65 mg/dL).  Liver Function Tests: No results for input(s): AST, ALT, ALKPHOS, BILITOT, PROT, ALBUMIN in the last 168 hours. No results for input(s): LIPASE, AMYLASE in the last 168 hours. No results for input(s): AMMONIA in the last 168 hours.  Coagulation Profile: No results for input(s): INR, PROTIME in the last 168 hours.  Cardiac Enzymes: No results for input(s): CKTOTAL, CKMB, CKMBINDEX, TROPONINI in the last 168 hours.  BNP (last 3 results) No results for input(s): PROBNP in the last 8760 hours.  HbA1C: No results for input(s): HGBA1C in the last 72 hours.  CBG: Recent Labs  Lab 04/12/18 1705 04/12/18 2127 04/13/18 0746 04/13/18 1206 04/13/18 1703  GLUCAP 139* 202* 132* 86 277*    Lipid Profile: No results for input(s): CHOL, HDL, LDLCALC, TRIG, CHOLHDL, LDLDIRECT in the last 72 hours.  Thyroid Function Tests: No results for input(s): TSH,  T4TOTAL, FREET4, T3FREE, THYROIDAB in the last 72 hours.  Anemia Panel: No results for input(s): VITAMINB12, FOLATE, FERRITIN, TIBC, IRON, RETICCTPCT in the last 72 hours.  Urine analysis:    Component Value Date/Time   COLORURINE YELLOW 03/30/2018 1930   APPEARANCEUR CLEAR 03/30/2018 1930   LABSPEC 1.009 03/30/2018 1930   PHURINE 6.0 03/30/2018 1930   GLUCOSEU 50 (A) 03/30/2018 1930   HGBUR SMALL (A) 03/30/2018 1930   BILIRUBINUR NEGATIVE 03/30/2018 1930   BILIRUBINUR small 02/26/2018 1202   KETONESUR NEGATIVE 03/30/2018 1930   PROTEINUR NEGATIVE 03/30/2018 1930   UROBILINOGEN 1.0 02/26/2018 1202   UROBILINOGEN 1.0 12/11/2016 1020   NITRITE NEGATIVE 03/30/2018 1930   LEUKOCYTESUR TRACE (A) 03/30/2018 1930    Sepsis Labs: Lactic Acid, Venous    Component Value Date/Time   LATICACIDVEN 1.2 03/30/2018 1837    MICROBIOLOGY: No results found for this or any previous visit (from the past 240 hour(s)).  RADIOLOGY STUDIES/RESULTS:  Ct Angio Chest Pe W Or Wo Contrast  Result Date: 03/30/2018 CLINICAL DATA:  Shortness of breath PE suspected, high pretest prob. Patient with known epidural abscess and endocarditis. Now with left-sided chest pain. EXAM: CT ANGIOGRAPHY CHEST WITH CONTRAST TECHNIQUE: Multidetector CT imaging of the chest was performed using the standard protocol during bolus administration of intravenous contrast. Multiplanar CT image reconstructions and MIPs were obtained to evaluate the vascular anatomy. CONTRAST:  54mL ISOVUE-370 IOPAMIDOL (ISOVUE-370) INJECTION 76% COMPARISON:  Chest radiographs earlier this day. FINDINGS: Cardiovascular: There are no filling defects within the pulmonary arteries to suggest pulmonary embolus. The thoracic aorta is normal in caliber without dissection. Mild cardiomegaly. Small amount pericardial fluid. Mediastinum/Nodes: Loculated left pleural effusion tracks along the left aspect of the mediastinum. Small left hilar nodes without bulky  adenopathy. No enlarged mediastinal nodes. No dominant thyroid nodule. The esophagus is decompressed. Lungs/Pleura: Moderate left pleural effusion is loculated and tracks along the posterior, lateral, and  medial hemithorax. Adjacent airspace disease in the left lower lobe, favoring compressive atelectasis, with adjacent compressive atelectasis in the right upper lobe. Pleural thickening versus loculated pleural effusion the upper medial right hemithorax, with additional areas of pleural thickening dependently. Dependent atelectasis in the right lower lobe. Trachea and bronchi are patent. Upper Abdomen: No acute findings. Musculoskeletal: Patient with known epidural abscess, not well delineated by CT. No bony destructive change. Review of the MIP images confirms the above findings. IMPRESSION: 1. No pulmonary embolus. 2. Moderate loculated left pleural effusion. Adjacent airspace disease likely compressive atelectasis, pneumonia not excluded in the setting of spinal infection and endocarditis. Mild right pleural thickening or loculated pleural fluid in the upper right hemithorax. Electronically Signed   By: Narda Rutherford M.D.   On: 03/30/2018 23:21   Ct Thoracic Spine Wo Contrast  Result Date: 03/20/2018 CLINICAL DATA:  Progressive back pain.  Swelling of the lower back. EXAM: CT THORACIC SPINE WITHOUT CONTRAST TECHNIQUE: Multidetector CT images of the thoracic were obtained using the standard protocol without intravenous contrast. COMPARISON:  None. FINDINGS: Alignment: Normal. 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 Paraspinal and other soft tissues: There is abnormal gas and fluid in the soft tissues of the right side of the base of the neck and in the right supraclavicular region. Paraspinal soft tissues appear normal throughout the thoracic spine. Disc levels: There is no evidence of disc protrusion or significant disc bulging or spinal or foraminal stenosis or other significant abnormality of  the thoracic spine. IMPRESSION: 1. Evidence of cellulitis involving the right side of the base of the neck extending into the right supraclavicular region with fluid and gas in the soft tissues at the base of the right side of the neck. 2. No significant abnormality of the thoracic spine. 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 Electronically Signed   By: Francene Boyers M.D.   On: 03/20/2018 11:25   Ct Lumbar Spine Wo Contrast  Addendum Date: 03/20/2018   ADDENDUM REPORT: 03/20/2018 11:44 ADDENDUM: Critical Value/emergent results were called by telephone at the time of interpretation on 03/20/2018 at 11:30 am to Dr. Sharyn Creamer , who verbally acknowledged these results. Electronically Signed   By: Francene Boyers M.D.   On: 03/20/2018 11:44   Result Date: 03/20/2018 CLINICAL DATA:  Increasing low back pain and soft tissue swelling. EXAM: CT LUMBAR SPINE WITHOUT CONTRAST TECHNIQUE: Multidetector CT imaging of the lumbar spine was performed without intravenous contrast administration. Multiplanar CT image reconstructions were also generated. IV contrast could not be utilized due to the lack of an appropriate IV. COMPARISON:  None. FINDINGS: Segmentation: 5 lumbar type vertebrae. Alignment: Normal. Vertebrae: There is a moth-eaten appearance of the spinous processes of L3 and L4 which is worrisome for osteomyelitis. Bilateral pars defects at L5 with grade 1 spondylolisthesis. 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 Paraspinal and other soft tissues: There is an extensive abnormal fluid collection in the subcutaneous soft tissues of the posterior aspect of the back extending from approximately L1-2 to S3. This fluid collection is lobulated and measures approximately 20 x 9 x 2.5 cm. It is centered slightly to the left of midline and has a mass effect upon the adjacent posterior paraspinal muscles. There is abnormal lucency in the underlying paraspinal muscles which could represent myositis. Disc levels: T11-12: No  significant abnormality. Butterfly T12 vertebra. T12-L1: No significant abnormality. L1-2: Normal disc. Abnormal edema in the posterior paraspinal musculature with adjacent fluid collection in the subcutaneous fat of the posterior aspect of  the back as described above. L2-3: Normal disc. L3-4: Normal disc. Lucency in the posterior paraspinal soft tissues extends to the posterior aspect of the thecal sac on image 80 of series 4 but there is no discrete epidural abscess. L4-5: Normal disc.  No evidence of epidural abscess. L5-S1: Grade 1 spondylolisthesis. No disc bulging or protrusion. Bilateral pars defects. No visible epidural abscess. IMPRESSION: 1. Extensive abnormal fluid collection in the subcutaneous fat of the midline of the back with underlying marked abnormality of the posterior paraspinal musculature from L1-2 through S3. This is worrisome for subcutaneous abscess and myositis. 2. Moth-eaten appearance of the spinous processes of L3 and L4 consistent with osteomyelitis. 3. No discrete epidural abscess. However, the abnormal edema in the paraspinal musculature extends to the posterior aspect of the spinal canal at L3-4. 4. MRI with and without contrast may better define the extent of the soft tissue and infection and could detect epidural extension that is not apparent on this unenhanced CT scan. Electronically Signed: By: Francene Boyers M.D. On: 03/20/2018 11:18   Mr Cervical Spine Wo Contrast  Result Date: 03/30/2018 CLINICAL DATA:  42 year old male with history of MSSA bacteremia with known endocarditis and epidural abscess extending from C2 through the sacrum. Recent episode of hypotension and acute lower extremity weakness. Evaluate abscess and possible spinal cord infarct. EXAM: MRI CERVICAL, THORACIC SPINE WITHOUT CONTRAST TECHNIQUE: Multiplanar and multiecho pulse sequences of the cervical spine, to include the craniocervical junction and cervicothoracic junction, and thoracic and lumbar spine,  were obtained without intravenous contrast. COMPARISON:  Prior MRI from 03/22/2018. FINDINGS: MRI CERVICAL SPINE FINDINGS Alignment: Examination technically limited as the patient was unable to tolerate the full length of the exam. Additionally, images provided are degraded by motion artifact. Reversal of the normal cervical lordosis with apex at C5, stable. No interval listhesis or malalignment. Vertebrae: Vertebral body height maintained without acute or interval fracture. Bone marrow signal intensity diffusely decreased on T1 weighted imaging, suspected to be related to anemia and chronic disease. No discrete osseous lesions. No abnormal marrow edema. No evidence for interval discitis. Cord: Signal intensity within the cervical spinal cord is within normal limits. No findings to suggest interval cord infarction on this motion degraded and limited exam. Previously identified epidural collection involving the ventral epidural space extending from C2 inferiorly is decreased in size, now measuring up to 7 mm in maximal AP diameter at the level of C2. Collection diffusely involves the ventral epidural space, but also is seen dorsally as well. Slightly improved diffuse spinal stenosis with thecal sac patency. Posterior Fossa, vertebral arteries, paraspinal tissues: Visualized brain and posterior fossa within normal limits. Craniocervical junction normal. Scattered edema within the posterior paraspinous soft tissues. Normal intravascular flow voids seen within the vertebral arteries bilaterally. Disc levels: C2-C3: Unremarkable. C3-C4: Small left foraminal protrusion with associated moderate left C4 foraminal stenosis. C4-C5:  Unremarkable. C5-C6: Left eccentric disc bulge with uncovertebral hypertrophy. Moderate left C6 foraminal narrowing. C6-C7: Right foraminal disc protrusion with associated moderate right C7 foraminal stenosis. C7-T1:  Unremarkable. MRI THORACIC SPINE FINDINGS Alignment: Examination markedly  limited as the patient was unable to tolerate the full length of the exam. Sagittal T1, T2, and STIR sequences only were performed. No axial images obtained. Vertebral bodies normally aligned with preservation of the normal thoracic kyphosis. Vertebrae: Vertebral body height maintained without evidence for interval fracture. Butterfly vertebra noted at T12. Diffusely decreased T1 weighted signal intensity throughout the visualized bone marrow, like related to anemia chronic  disease. No findings to suggest interval or new discitis or septic arthritis. Cord: Signal intensity within the thoracic spinal cord grossly within normal limits on these limited sagittal views. No definite cord signal abnormality or cord edema to suggest acute spinal cord infarction. Previously seen diffuse epidural collection appears overall decreased in size from previous, with improved spinal stenosis and thecal sac patency. Paraspinal and other soft tissues: Scattered edema seen within the posterior paraspinous soft tissues at the upper back/cervicothoracic junction. No discrete soft tissue collections. Disc levels: T5-6: Central disc protrusion indenting upon the ventral thoracic spinal cord, stable. IMPRESSION: 1. Technically limited exam due to motion artifact and the patient's inability to tolerate the full length of the exam. Cervical and thoracic spine only was imaged, and only sagittal sequences of the thoracic spine were obtained. 2. No imaging findings to suggest spinal cord infarction identified on this limited exam. 3. Interval improvement in diffuse epidural collection, decreased in size as compared to previous exam with improved diffuse spinal stenosis and thecal sac patency. No new discitis or facet arthritis. Electronically Signed   By: Rise Mu M.D.   On: 03/30/2018 22:52   Mr Thoracic Spine Wo Contrast  Result Date: 03/30/2018 CLINICAL DATA:  42 year old male with history of MSSA bacteremia with known  endocarditis and epidural abscess extending from C2 through the sacrum. Recent episode of hypotension and acute lower extremity weakness. Evaluate abscess and possible spinal cord infarct. EXAM: MRI CERVICAL, THORACIC SPINE WITHOUT CONTRAST TECHNIQUE: Multiplanar and multiecho pulse sequences of the cervical spine, to include the craniocervical junction and cervicothoracic junction, and thoracic and lumbar spine, were obtained without intravenous contrast. COMPARISON:  Prior MRI from 03/22/2018. FINDINGS: MRI CERVICAL SPINE FINDINGS Alignment: Examination technically limited as the patient was unable to tolerate the full length of the exam. Additionally, images provided are degraded by motion artifact. Reversal of the normal cervical lordosis with apex at C5, stable. No interval listhesis or malalignment. Vertebrae: Vertebral body height maintained without acute or interval fracture. Bone marrow signal intensity diffusely decreased on T1 weighted imaging, suspected to be related to anemia and chronic disease. No discrete osseous lesions. No abnormal marrow edema. No evidence for interval discitis. Cord: Signal intensity within the cervical spinal cord is within normal limits. No findings to suggest interval cord infarction on this motion degraded and limited exam. Previously identified epidural collection involving the ventral epidural space extending from C2 inferiorly is decreased in size, now measuring up to 7 mm in maximal AP diameter at the level of C2. Collection diffusely involves the ventral epidural space, but also is seen dorsally as well. Slightly improved diffuse spinal stenosis with thecal sac patency. Posterior Fossa, vertebral arteries, paraspinal tissues: Visualized brain and posterior fossa within normal limits. Craniocervical junction normal. Scattered edema within the posterior paraspinous soft tissues. Normal intravascular flow voids seen within the vertebral arteries bilaterally. Disc levels:  C2-C3: Unremarkable. C3-C4: Small left foraminal protrusion with associated moderate left C4 foraminal stenosis. C4-C5:  Unremarkable. C5-C6: Left eccentric disc bulge with uncovertebral hypertrophy. Moderate left C6 foraminal narrowing. C6-C7: Right foraminal disc protrusion with associated moderate right C7 foraminal stenosis. C7-T1:  Unremarkable. MRI THORACIC SPINE FINDINGS Alignment: Examination markedly limited as the patient was unable to tolerate the full length of the exam. Sagittal T1, T2, and STIR sequences only were performed. No axial images obtained. Vertebral bodies normally aligned with preservation of the normal thoracic kyphosis. Vertebrae: Vertebral body height maintained without evidence for interval fracture. Butterfly vertebra noted at T12.  Diffusely decreased T1 weighted signal intensity throughout the visualized bone marrow, like related to anemia chronic disease. No findings to suggest interval or new discitis or septic arthritis. Cord: Signal intensity within the thoracic spinal cord grossly within normal limits on these limited sagittal views. No definite cord signal abnormality or cord edema to suggest acute spinal cord infarction. Previously seen diffuse epidural collection appears overall decreased in size from previous, with improved spinal stenosis and thecal sac patency. Paraspinal and other soft tissues: Scattered edema seen within the posterior paraspinous soft tissues at the upper back/cervicothoracic junction. No discrete soft tissue collections. Disc levels: T5-6: Central disc protrusion indenting upon the ventral thoracic spinal cord, stable. IMPRESSION: 1. Technically limited exam due to motion artifact and the patient's inability to tolerate the full length of the exam. Cervical and thoracic spine only was imaged, and only sagittal sequences of the thoracic spine were obtained. 2. No imaging findings to suggest spinal cord infarction identified on this limited exam. 3.  Interval improvement in diffuse epidural collection, decreased in size as compared to previous exam with improved diffuse spinal stenosis and thecal sac patency. No new discitis or facet arthritis. Electronically Signed   By: Rise Mu M.D.   On: 03/30/2018 22:52   Mr Cervical Spine W Wo Contrast  Result Date: 03/22/2018 CLINICAL DATA:  Spine infection. EXAM: MRI TOTAL SPINE WITHOUT AND WITH CONTRAST TECHNIQUE: Multisequence MR imaging of the spine from the cervical spine to the sacrum was performed prior to and following IV contrast administration. CONTRAST:  6 cc Gadavist intravenous COMPARISON:  CT of the thoracic and lumbar spine from 2 days ago FINDINGS: MRI CERVICAL SPINE FINDINGS Alignment: Normal Vertebrae: No evidence of osseous infection. Canal/Cord: There is extensive spinal fluid collection preferentially in the ventral but also in the right more than left dorsal canal. Subarachnoid space is diffusely effaced. Maximal thickness is posterior to C2 at 9 mm. No cord signal abnormality. Posterior Fossa, vertebral arteries, paraspinal tissues: No retropharyngeal or other discrete soft tissue collection. Disc levels: C2-3: Unremarkable. C3-4: Small left foraminal protrusion with moderate narrowing C4-5: Unremarkable. C5-6: Disc narrowing and bulging with asymmetric left uncovertebral spurring. Left foraminal impingement C6-7: Right foraminal protrusion mild narrowing. C7-T1:Unremarkable. MRI THORACIC SPINE FINDINGS Alignment:  Normal Vertebrae: No evidence of osteomyelitis or discitis. T12 butterfly vertebra. Canal/Cord: Cervical ventral epidural collection continues throughout the thoracic levels. There is also a focal dorsal component at T3-4 to T5-6, where thecal sac effacement is accentuated and there is cord flattening. No cord edema. Paraspinal and other soft tissues: Paraspinous phlegmon on the left at T8-T12, with new complex left pleural effusion and lower lobe atelectasis Disc levels:  T5-6 central disc protrusion. MRI LUMBAR SPINE FINDINGS Segmentation:  5 lumbar type vertebral bodies Alignment:  Grade 1 anterolisthesis at L5-S1. Vertebrae: Marrow edema and heterogeneous enhancement within the L2, L3, and L4 spinous processes. No discitis or facet edema. Conus medullaris: Extends to the L1 level and is non edematous. There is extensive epidural collection completely effacing the thecal sac throughout the lumbar spine until L4-5 and below where the collection becomes ventral and right eccentric. The infection communicates with extensive bilateral abscess within the intrinsic back muscles via the interspinous space at L3-4. Patient had recent subcutaneous collection and left buttocks collection drainage, with packing seen in place. This midline, upper subcutaneous collection communicates with the paravertebral abscess along its superior margin based on postcontrast axial images. Paraspinal and other soft tissues: As above.  Distended bladder Disc levels:  Chronic bilateral pars defects at L5. Critical Value/emergent results were called by telephone at the time of interpretation on 03/22/2018 at 3:04 pm to Dr. Thedore Mins , who verbally acknowledged these results. IMPRESSION: 1. Epidural abscess from C2 to sacrum as described. Maximal cord compression from T3-4 to T5-6. The thecal sac is completely effaced from L1 to L4-5. At the L3-4 interspinous space the spinal abscess communicates with large bilateral abscesses within the intrinsic back muscles. There is osteomyelitis of the L2, L3, and L4 spinous processes. No discitis or facet arthritis. 2. Left paravertebral abscess along the lower thoracic spine with small left empyema that is new from CT 2 days ago. 3. Distended bladder Electronically Signed   By: Marnee Spring M.D.   On: 03/22/2018 15:11   Mr Thoracic Spine W Wo Contrast  Result Date: 03/22/2018 CLINICAL DATA:  Spine infection. EXAM: MRI TOTAL SPINE WITHOUT AND WITH CONTRAST TECHNIQUE:  Multisequence MR imaging of the spine from the cervical spine to the sacrum was performed prior to and following IV contrast administration. CONTRAST:  6 cc Gadavist intravenous COMPARISON:  CT of the thoracic and lumbar spine from 2 days ago FINDINGS: MRI CERVICAL SPINE FINDINGS Alignment: Normal Vertebrae: No evidence of osseous infection. Canal/Cord: There is extensive spinal fluid collection preferentially in the ventral but also in the right more than left dorsal canal. Subarachnoid space is diffusely effaced. Maximal thickness is posterior to C2 at 9 mm. No cord signal abnormality. Posterior Fossa, vertebral arteries, paraspinal tissues: No retropharyngeal or other discrete soft tissue collection. Disc levels: C2-3: Unremarkable. C3-4: Small left foraminal protrusion with moderate narrowing C4-5: Unremarkable. C5-6: Disc narrowing and bulging with asymmetric left uncovertebral spurring. Left foraminal impingement C6-7: Right foraminal protrusion mild narrowing. C7-T1:Unremarkable. MRI THORACIC SPINE FINDINGS Alignment:  Normal Vertebrae: No evidence of osteomyelitis or discitis. T12 butterfly vertebra. Canal/Cord: Cervical ventral epidural collection continues throughout the thoracic levels. There is also a focal dorsal component at T3-4 to T5-6, where thecal sac effacement is accentuated and there is cord flattening. No cord edema. Paraspinal and other soft tissues: Paraspinous phlegmon on the left at T8-T12, with new complex left pleural effusion and lower lobe atelectasis Disc levels: T5-6 central disc protrusion. MRI LUMBAR SPINE FINDINGS Segmentation:  5 lumbar type vertebral bodies Alignment:  Grade 1 anterolisthesis at L5-S1. Vertebrae: Marrow edema and heterogeneous enhancement within the L2, L3, and L4 spinous processes. No discitis or facet edema. Conus medullaris: Extends to the L1 level and is non edematous. There is extensive epidural collection completely effacing the thecal sac throughout the  lumbar spine until L4-5 and below where the collection becomes ventral and right eccentric. The infection communicates with extensive bilateral abscess within the intrinsic back muscles via the interspinous space at L3-4. Patient had recent subcutaneous collection and left buttocks collection drainage, with packing seen in place. This midline, upper subcutaneous collection communicates with the paravertebral abscess along its superior margin based on postcontrast axial images. Paraspinal and other soft tissues: As above.  Distended bladder Disc levels: Chronic bilateral pars defects at L5. Critical Value/emergent results were called by telephone at the time of interpretation on 03/22/2018 at 3:04 pm to Dr. Thedore Mins , who verbally acknowledged these results. IMPRESSION: 1. Epidural abscess from C2 to sacrum as described. Maximal cord compression from T3-4 to T5-6. The thecal sac is completely effaced from L1 to L4-5. At the L3-4 interspinous space the spinal abscess communicates with large bilateral abscesses within the intrinsic back muscles. There is osteomyelitis of the  L2, L3, and L4 spinous processes. No discitis or facet arthritis. 2. Left paravertebral abscess along the lower thoracic spine with small left empyema that is new from CT 2 days ago. 3. Distended bladder Electronically Signed   By: Marnee Spring M.D.   On: 03/22/2018 15:11   Mr Lumbar Spine W Wo Contrast  Result Date: 03/22/2018 CLINICAL DATA:  Spine infection. EXAM: MRI TOTAL SPINE WITHOUT AND WITH CONTRAST TECHNIQUE: Multisequence MR imaging of the spine from the cervical spine to the sacrum was performed prior to and following IV contrast administration. CONTRAST:  6 cc Gadavist intravenous COMPARISON:  CT of the thoracic and lumbar spine from 2 days ago FINDINGS: MRI CERVICAL SPINE FINDINGS Alignment: Normal Vertebrae: No evidence of osseous infection. Canal/Cord: There is extensive spinal fluid collection preferentially in the ventral but  also in the right more than left dorsal canal. Subarachnoid space is diffusely effaced. Maximal thickness is posterior to C2 at 9 mm. No cord signal abnormality. Posterior Fossa, vertebral arteries, paraspinal tissues: No retropharyngeal or other discrete soft tissue collection. Disc levels: C2-3: Unremarkable. C3-4: Small left foraminal protrusion with moderate narrowing C4-5: Unremarkable. C5-6: Disc narrowing and bulging with asymmetric left uncovertebral spurring. Left foraminal impingement C6-7: Right foraminal protrusion mild narrowing. C7-T1:Unremarkable. MRI THORACIC SPINE FINDINGS Alignment:  Normal Vertebrae: No evidence of osteomyelitis or discitis. T12 butterfly vertebra. Canal/Cord: Cervical ventral epidural collection continues throughout the thoracic levels. There is also a focal dorsal component at T3-4 to T5-6, where thecal sac effacement is accentuated and there is cord flattening. No cord edema. Paraspinal and other soft tissues: Paraspinous phlegmon on the left at T8-T12, with new complex left pleural effusion and lower lobe atelectasis Disc levels: T5-6 central disc protrusion. MRI LUMBAR SPINE FINDINGS Segmentation:  5 lumbar type vertebral bodies Alignment:  Grade 1 anterolisthesis at L5-S1. Vertebrae: Marrow edema and heterogeneous enhancement within the L2, L3, and L4 spinous processes. No discitis or facet edema. Conus medullaris: Extends to the L1 level and is non edematous. There is extensive epidural collection completely effacing the thecal sac throughout the lumbar spine until L4-5 and below where the collection becomes ventral and right eccentric. The infection communicates with extensive bilateral abscess within the intrinsic back muscles via the interspinous space at L3-4. Patient had recent subcutaneous collection and left buttocks collection drainage, with packing seen in place. This midline, upper subcutaneous collection communicates with the paravertebral abscess along its  superior margin based on postcontrast axial images. Paraspinal and other soft tissues: As above.  Distended bladder Disc levels: Chronic bilateral pars defects at L5. Critical Value/emergent results were called by telephone at the time of interpretation on 03/22/2018 at 3:04 pm to Dr. Thedore Mins , who verbally acknowledged these results. IMPRESSION: 1. Epidural abscess from C2 to sacrum as described. Maximal cord compression from T3-4 to T5-6. The thecal sac is completely effaced from L1 to L4-5. At the L3-4 interspinous space the spinal abscess communicates with large bilateral abscesses within the intrinsic back muscles. There is osteomyelitis of the L2, L3, and L4 spinous processes. No discitis or facet arthritis. 2. Left paravertebral abscess along the lower thoracic spine with small left empyema that is new from CT 2 days ago. 3. Distended bladder Electronically Signed   By: Marnee Spring M.D.   On: 03/22/2018 15:11   Dg Chest Port 1 View  Result Date: 03/30/2018 CLINICAL DATA:  Shortness of breath EXAM: PORTABLE CHEST 1 VIEW COMPARISON:  03/30/2018 at 0227 hours FINDINGS: Moderate layering left pleural  effusion, increased. Left lower lobe opacity, atelectasis versus pneumonia. Right lung is clear.  No pneumothorax. The heart is normal in size. IMPRESSION: Moderate layering left pleural effusion, increased. Left lower lobe opacity, atelectasis versus pneumonia. Electronically Signed   By: Charline Bills M.D.   On: 03/30/2018 19:06   Dg Chest Port 1 View  Result Date: 03/30/2018 CLINICAL DATA:  Chest pain EXAM: PORTABLE CHEST 1 VIEW COMPARISON:  None. FINDINGS: Retrocardiac opacity, atelectasis versus pneumonia. Possible small left pleural effusion. Right lung is clear. No pneumothorax. The heart is normal in size. IMPRESSION: Retrocardiac opacity, atelectasis versus pneumonia. Possible small left pleural effusion. Electronically Signed   By: Charline Bills M.D.   On: 03/30/2018 02:49   [place] Ekg Site  Rite  Result Date: 04/04/2018 If Site Rite image not attached, placement could not be confirmed due to current cardiac rhythm.  [place] Ekg Site Rite  Result Date: 03/21/2018 If Site Rite image not attached, placement could not be confirmed due to current cardiac rhythm.    LOS: 24 days   Signature  Shon Hale M.D on 04/13/2018 at 7:00 PM  To page go to [email or web address] - password [email or web address]

## 2018-04-13 NOTE — Progress Notes (Addendum)
Subjective: Patient reports "I'm doing ok"  Objective: Vital signs in last 24 hours: Temp:  [99 F (37.2 C)-99.4 F (37.4 C)] 99.4 F (37.4 C) (10/15 0544) Pulse Rate:  [84-98] 84 (10/15 0544) Resp:  [16-18] 18 (10/15 0544) BP: (96-122)/(61-78) 122/78 (10/15 0544) SpO2:  [93 %-97 %] 93 % (10/15 0544)  Intake/Output from previous day: No intake/output data recorded. Intake/Output this shift: No intake/output data recorded.  Walking in room. Reports no pain at present. Neuro exam unchanged. Good strength all extremities including hand intrinsics. Lumbar wounds managed by wound therapy. IVAB continue  Lab Results: Recent Labs    04/13/18 0814  WBC 8.5  HGB 8.5*  HCT 27.7*  PLT 486*   BMET Recent Labs    04/13/18 0814  NA 139  K 3.4*  CL 103  CO2 28  GLUCOSE 145*  BUN 6  CREATININE 0.65  CALCIUM 8.3*    Studies/Results: No results found.  Assessment/Plan:   LOS: 24 days  Supportive care continues   Georgiann Cocker 04/13/2018, 11:08 AM   Patient continues to progress.

## 2018-04-14 LAB — GLUCOSE, CAPILLARY
GLUCOSE-CAPILLARY: 112 mg/dL — AB (ref 70–99)
GLUCOSE-CAPILLARY: 110 mg/dL — AB (ref 70–99)
GLUCOSE-CAPILLARY: 203 mg/dL — AB (ref 70–99)
GLUCOSE-CAPILLARY: 281 mg/dL — AB (ref 70–99)

## 2018-04-14 NOTE — Progress Notes (Signed)
PROGRESS NOTE        PATIENT DETAILS  Name: Tony Long  Age: 42 y.o.  Sex: male Date of Birth: 05-Mar-1976 Admit Date: 03/20/2018 Admitting Physician Kendell Bane, MD  XWR:[id number], Rolm Gala, FNP   Brief Narrative:  [name] a 42 y.o. male history of IVDA, DM-2, hypertension admitted for worsening back pain-further evaluation revealed MSSA bacteremia with tricuspid valve endocarditis, epidural abscess involving C2 through sacrum, and numerous soft tissue back abscesses.  Evaluated by general surgery-underwent I&D of her lower back soft tissue abscess, evaluated by neurosurgery-not felt to be a candidate for decompressive surgery due to extensive nature of the disease and lack of any significant neurological findings.  ID following He was initially kept on IV cefazolin and then antibiotic coverage was broadened to Vancomycin and Rocephin on 03/30/2018 by ID.   Currently on IV nafcillin on 04/16/2018, thereafter 4 weeks of Keflex 500 p.o. twice daily per ID.Marland Kitchen   See below for further details   Subjective:  Patient in bed, appears comfortable, denies any headache, no fever, no chest pain or pressure, no shortness of breath , no abdominal pain. No focal weakness.   Assessment/Plan:   MSSA Bacteremia with tricuspid valve endocarditis, extensive epidural abscess (C2 through sacrum) and large soft tissue lower back abscess  : due to extensive nature of the epidural abscess - neurosurgery does not recommend decompressive surgery. Seen by ID and current recommendation noted, he was initially kept on IV cefazolin and then antibiotic coverage was broadened to Vancomycin and Rocephin on 03/30/2018 by ID. Currently on IV nafcillin until 04/16/2018, thereafter 4 weeks of Keflex 500 p.o. twice daily per ID.. D/w Dr Jolayne Haines on 04/09/18, he confirms antibiotic plan above. Given history of IVDA-not a candidate for outpatient IV Abx therapy. He has received a PICC line,  continue IV antibiotics through PICC Line.  Patient doing fairly well.    Cocaine/Substance abuse: Withdrawal symptoms have resolved, continue  Suboxone, he does not want short-acting pain medications anymore.  Polysubstance abuse/IVDA: Counseled to quit.  Anemia.  Normocytic, he had some chronic anemia upon admission as well and now worse due to him dilution from IV fluids also some element of iron deficiency per anemia panel likely due to multiple blood draws.  No signs of ongoing bleeding or acute blood loss, s/p  1 unit of packed RBC on 04/06/2018 and monitor, hgb is stable, will check CBCs on Tuesdays Thursdays and Saturdays.  Mild lower extremity leg edema. improving with TED stockings.  New onset of left-sided pleuritic chest pain starting on late night 03/29/2018.  Nonspecific EKG changes stable on combination of aspirin, beta-blocker and statin.  Echocardiogram shows no wall motion abnormality and preserved EF, patient remains chest pain-free.  Hypertension: Monitor on beta-blocker.  Acute urinary retention: Continue Foley and Flomax, trial of Foley removal failed on 04/03/2018, likely combination of lack of activity and spine infection, failed voiding trial again on 04/09/2018, Foley reinserted and 1450 mL of urine removed, ,will need outpatient follow-up with urology discharge.  Severe hypokalemia and hypomagnesemia. Replace and recheck, recheck BMP on Tuesdays Thursdays and Saturdays.  DM-2: Poor outpatient control, last A1c 12.6, continue Lantus 25 units nightly and sliding scale.   CBG (last 3)  Recent Labs    04/13/18 1703 04/13/18 2144 04/14/18 0752  GLUCAP 277* 238* 112*   Lab Results  Component Value Date   HGBA1C 12.6 (A) 02/26/2018     DVT Prophylaxis: Prophylactic heparin  Code Status: Full code  Disposition Plan: Home after completing IV antibiotics ...04/16/18 or 04/17/18  Antimicrobial agents: Anti-infectives (From admission, onward)   Start      Dose/Rate Route Frequency Ordered Stop   04/01/18 1300  nafcillin 2 g in sodium chloride 0.9 % 100 mL IVPB     2 g 200 mL/hr over 30 Minutes Intravenous Every 4 hours 04/01/18 1242     04/01/18 1200  nafcillin injection 2 g  Status:  Discontinued     2 g Intravenous Every 4 hours 04/01/18 1129 04/01/18 1242   03/31/18 0200  vancomycin (VANCOCIN) IVPB 750 mg/150 ml premix  Status:  Discontinued     750 mg 150 mL/hr over 60 Minutes Intravenous Every 8 hours 03/30/18 1628 04/01/18 1129   03/31/18 0100  meropenem (MERREM) 1 g in sodium chloride 0.9 % 100 mL IVPB  Status:  Discontinued     1 g 200 mL/hr over 30 Minutes Intravenous Every 8 hours 03/30/18 1628 04/01/18 1129   03/30/18 1630  vancomycin (VANCOCIN) 1,500 mg in sodium chloride 0.9 % 500 mL IVPB     1,500 mg 250 mL/hr over 120 Minutes Intravenous NOW 03/30/18 1619 03/30/18 1925   03/30/18 1630  meropenem (MERREM) 2 g in sodium chloride 0.9 % 100 mL IVPB     2 g 200 mL/hr over 30 Minutes Intravenous NOW 03/30/18 1619 03/30/18 1754   03/21/18 0930  ceFAZolin (ANCEF) IVPB 2g/100 mL premix  Status:  Discontinued     2 g 200 mL/hr over 30 Minutes Intravenous Every 8 hours 03/21/18 0920 03/30/18 1616      Procedures:   MRI C, T and L-spine done 03/30/2018 and 03/31/2018.    CT angiogram chest 03/30/2018.  1. No pulmonary embolus. 2. Moderate loculated left pleural effusion. Adjacent airspace disease likely compressive atelectasis, pneumonia not excluded in the setting of spinal infection and endocarditis. Mild right pleural thickening or loculated pleural fluid in the upper right hemithorax.  Repeat echocardiogram 03/30/2018 - Left ventricle: The cavity size was normal. Wall thickness was normal. Systolic function was normal. The estimated ejection fraction was in the range of 60% to 65%. Wall motion was normal; there were no regional wall motion abnormalities. Left ventricular diastolic function parameters were normal.  TEE - - Left  ventricle: The cavity size was normal. Wall thickness wasnormal. Systolic function was normal. The estimated ejectionfraction was in the range of 60% to 65%. - Aortic valve: No evidence of vegetation. - Mitral valve: No evidence of vegetation. - Left atrium: No evidence of thrombus in the atrial cavity or appendage. No evidence of thrombus in the appendage. - Right atrium: No evidence of thrombus in the atrial cavity orappendage. - Tricuspid valve: There was a vegetation. There was a small (1.2cmx .6cm), mobile vegetation on the septal leaflet. There was mildregurgitation. - Pulmonic valve: No evidence of vegetation.  Impressions:   Tricuspid valve endocarditis. Discussed with primary team   MRI C-T-L Spine - 1. Epidural abscess from C2 to sacrum as described. Maximal cord compression from T3-4 to T5-6. The thecal sac is completely effaced from L1 to L4-5. At the L3-4 interspinous space the spinal abscess communicates with large bilateral abscesses within the intrinsic back muscles. There is osteomyelitis of the L2, L3, and L4 spinous processes. No discitis or facet arthritis. 2. Left paravertebral abscess along the lower thoracic spine with small  left empyema that is new from CT 2 days ago. 3. Distended bladder  CT - 1. Evidence of cellulitis involving the right side of the base of the neck extending into the right supraclavicular region with fluid and gas in the soft tissues at the base of the right side of the neck. 2. No significant abnormality of the thoracic spine. 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  CT -  1. Extensive abnormal fluid collection in the subcutaneous fat of the midline of the back with underlying marked abnormality of the posterior paraspinal musculature from L1-2 through S3. This is worrisome for subcutaneous abscess and myositis. 2. Moth-eaten appearance of the spinous processes of L3 and L4 consistent with osteomyelitis. 3. No discrete epidural abscess.  However, the abnormal edema in the paraspinal musculature extends to the posterior aspect of the spinal canal at L3-4. 4. MRI with and without contrast may better define the extent of the soft tissue and infection and could detect epidural extension that is not apparent on this unenhanced CT scan.  9/22>> PICC line  I&D by [facility] of back soft tissue abscess on -  03/22/18  CONSULTS:  N.Surgery, ID, general surgery, PCCM    MEDICATIONS:  Scheduled Meds:  . aspirin  81 mg Oral Daily  . atorvastatin  40 mg Oral q1800  . buprenorphine-naloxone  2 tablet Sublingual Daily  . feeding supplement (PRO-STAT SUGAR FREE 64)  30 mL Oral TID WC  . gabapentin  300 mg Oral TID  . heparin injection (subcutaneous)  5,000 Units Subcutaneous Q8H  . insulin aspart  0-5 Units Subcutaneous QHS  . insulin aspart  0-9 Units Subcutaneous TID WC  . insulin aspart  4 Units Subcutaneous TID WC  . insulin glargine  25 Units Subcutaneous QHS  . metoprolol tartrate  50 mg Oral BID  . nicotine  21 mg Transdermal Daily  . polyethylene glycol  17 g Oral BID  . senna-docusate  2 tablet Oral QHS  . tamsulosin  0.4 mg Oral BID   Continuous Infusions: . nafcillin IV 2 g (04/14/18 0600)   PRN Meds:.acetaminophen, bisacodyl, clonazepam, cyclobenzaprine, hydrALAZINE, LORazepam, magnesium hydroxide, metoprolol tartrate, nitroGLYCERIN, sodium chloride flush, sodium phosphate   PHYSICAL EXAM: Vital signs: Vitals:   04/13/18 0544 04/13/18 1428 04/13/18 2146 04/14/18 0454  BP: 122/78 101/65 110/64 124/75  Pulse: 84 87 96 81  Resp: 18  18 18   Temp: 99.4 F (37.4 C) 99.4 F (37.4 C) 98.9 F (37.2 C) 99 F (37.2 C)  TempSrc:  Oral Oral Oral  SpO2: 93% 96% 93% 94%  Weight:      Height:       Filed Weights   03/21/18 0602  Weight: 68 kg   Body mass index is 20.92 kg/m.      Physical Exam  Awake Alert, Oriented X 3, No new F.N deficits, Normal affect [place].AT,PERRAL Supple Neck,No JVD, No cervical  lymphadenopathy appriciated.  Symmetrical Chest wall movement, Good air movement bilaterally, CTAB RRR,No Gallops, Rubs or new Murmurs, No Parasternal Heave +ve B.Sounds, Abd Soft, No tenderness, No organomegaly appriciated, No rebound - guarding or rigidity. No Cyanosis, Clubbing or edema, No new Rash or bruise, foley in place     LABORATORY DATA: CBC: Recent Labs  Lab 04/09/18 0317 04/10/18 0812 04/13/18 0814  WBC 8.9 8.7 8.5  HGB 7.8* 7.8* 8.5*  HCT 25.8* 26.7* 27.7*  MCV 97.7 99.3 95.2  PLT 367 384 486*    Basic Metabolic Panel: Recent Labs  Lab 04/10/18  1610 04/13/18 0814  NA 137 139  K 3.6 3.4*  CL 99 103  CO2 30 28  GLUCOSE 245* 145*  BUN 5* 6  CREATININE 0.72 0.65  CALCIUM 7.9* 8.3*    GFR: Estimated Creatinine Clearance: 116.9 mL/min (by C-G formula based on SCr of 0.65 mg/dL).  Liver Function Tests: No results for input(s): AST, ALT, ALKPHOS, BILITOT, PROT, ALBUMIN in the last 168 hours. No results for input(s): LIPASE, AMYLASE in the last 168 hours. No results for input(s): AMMONIA in the last 168 hours.  Coagulation Profile: No results for input(s): INR, PROTIME in the last 168 hours.  Cardiac Enzymes: No results for input(s): CKTOTAL, CKMB, CKMBINDEX, TROPONINI in the last 168 hours.  BNP (last 3 results) No results for input(s): PROBNP in the last 8760 hours.  HbA1C: No results for input(s): HGBA1C in the last 72 hours.  CBG: Recent Labs  Lab 04/13/18 0746 04/13/18 1206 04/13/18 1703 04/13/18 2144 04/14/18 0752  GLUCAP 132* 86 277* 238* 112*    Lipid Profile: No results for input(s): CHOL, HDL, LDLCALC, TRIG, CHOLHDL, LDLDIRECT in the last 72 hours.  Thyroid Function Tests: No results for input(s): TSH, T4TOTAL, FREET4, T3FREE, THYROIDAB in the last 72 hours.  Anemia Panel: No results for input(s): VITAMINB12, FOLATE, FERRITIN, TIBC, IRON, RETICCTPCT in the last 72 hours.  Urine analysis:    Component Value Date/Time    COLORURINE YELLOW 03/30/2018 1930   APPEARANCEUR CLEAR 03/30/2018 1930   LABSPEC 1.009 03/30/2018 1930   PHURINE 6.0 03/30/2018 1930   GLUCOSEU 50 (A) 03/30/2018 1930   HGBUR SMALL (A) 03/30/2018 1930   BILIRUBINUR NEGATIVE 03/30/2018 1930   BILIRUBINUR small 02/26/2018 1202   KETONESUR NEGATIVE 03/30/2018 1930   PROTEINUR NEGATIVE 03/30/2018 1930   UROBILINOGEN 1.0 02/26/2018 1202   UROBILINOGEN 1.0 12/11/2016 1020   NITRITE NEGATIVE 03/30/2018 1930   LEUKOCYTESUR TRACE (A) 03/30/2018 1930    Sepsis Labs: Lactic Acid, Venous    Component Value Date/Time   LATICACIDVEN 1.2 03/30/2018 1837    MICROBIOLOGY: No results found for this or any previous visit (from the past 240 hour(s)).  RADIOLOGY STUDIES/RESULTS:  Ct Angio Chest Pe W Or Wo Contrast  Result Date: 03/30/2018 CLINICAL DATA:  Shortness of breath PE suspected, high pretest prob. Patient with known epidural abscess and endocarditis. Now with left-sided chest pain. EXAM: CT ANGIOGRAPHY CHEST WITH CONTRAST TECHNIQUE: Multidetector CT imaging of the chest was performed using the standard protocol during bolus administration of intravenous contrast. Multiplanar CT image reconstructions and MIPs were obtained to evaluate the vascular anatomy. CONTRAST:  54mL ISOVUE-370 IOPAMIDOL (ISOVUE-370) INJECTION 76% COMPARISON:  Chest radiographs earlier this day. FINDINGS: Cardiovascular: There are no filling defects within the pulmonary arteries to suggest pulmonary embolus. The thoracic aorta is normal in caliber without dissection. Mild cardiomegaly. Small amount pericardial fluid. Mediastinum/Nodes: Loculated left pleural effusion tracks along the left aspect of the mediastinum. Small left hilar nodes without bulky adenopathy. No enlarged mediastinal nodes. No dominant thyroid nodule. The esophagus is decompressed. Lungs/Pleura: Moderate left pleural effusion is loculated and tracks along the posterior, lateral, and medial hemithorax.  Adjacent airspace disease in the left lower lobe, favoring compressive atelectasis, with adjacent compressive atelectasis in the right upper lobe. Pleural thickening versus loculated pleural effusion the upper medial right hemithorax, with additional areas of pleural thickening dependently. Dependent atelectasis in the right lower lobe. Trachea and bronchi are patent. Upper Abdomen: No acute findings. Musculoskeletal: Patient with known epidural abscess, not well delineated by  CT. No bony destructive change. Review of the MIP images confirms the above findings. IMPRESSION: 1. No pulmonary embolus. 2. Moderate loculated left pleural effusion. Adjacent airspace disease likely compressive atelectasis, pneumonia not excluded in the setting of spinal infection and endocarditis. Mild right pleural thickening or loculated pleural fluid in the upper right hemithorax. Electronically Signed   By: Narda Rutherford M.D.   On: 03/30/2018 23:21   Ct Thoracic Spine Wo Contrast  Result Date: 03/20/2018 CLINICAL DATA:  Progressive back pain.  Swelling of the lower back. EXAM: CT THORACIC SPINE WITHOUT CONTRAST TECHNIQUE: Multidetector CT images of the thoracic were obtained using the standard protocol without intravenous contrast. COMPARISON:  None. FINDINGS: Alignment: Normal. 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 Paraspinal and other soft tissues: There is abnormal gas and fluid in the soft tissues of the right side of the base of the neck and in the right supraclavicular region. Paraspinal soft tissues appear normal throughout the thoracic spine. Disc levels: There is no evidence of disc protrusion or significant disc bulging or spinal or foraminal stenosis or other significant abnormality of the thoracic spine. IMPRESSION: 1. Evidence of cellulitis involving the right side of the base of the neck extending into the right supraclavicular region with fluid and gas in the soft tissues at the base of the right side of  the neck. 2. No significant abnormality of the thoracic spine. 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 Electronically Signed   By: Francene Boyers M.D.   On: 03/20/2018 11:25   Ct Lumbar Spine Wo Contrast  Addendum Date: 03/20/2018   ADDENDUM REPORT: 03/20/2018 11:44 ADDENDUM: Critical Value/emergent results were called by telephone at the time of interpretation on 03/20/2018 at 11:30 am to Dr. Sharyn Creamer , who verbally acknowledged these results. Electronically Signed   By: Francene Boyers M.D.   On: 03/20/2018 11:44   Result Date: 03/20/2018 CLINICAL DATA:  Increasing low back pain and soft tissue swelling. EXAM: CT LUMBAR SPINE WITHOUT CONTRAST TECHNIQUE: Multidetector CT imaging of the lumbar spine was performed without intravenous contrast administration. Multiplanar CT image reconstructions were also generated. IV contrast could not be utilized due to the lack of an appropriate IV. COMPARISON:  None. FINDINGS: Segmentation: 5 lumbar type vertebrae. Alignment: Normal. Vertebrae: There is a moth-eaten appearance of the spinous processes of L3 and L4 which is worrisome for osteomyelitis. Bilateral pars defects at L5 with grade 1 spondylolisthesis. 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 Paraspinal and other soft tissues: There is an extensive abnormal fluid collection in the subcutaneous soft tissues of the posterior aspect of the back extending from approximately L1-2 to S3. This fluid collection is lobulated and measures approximately 20 x 9 x 2.5 cm. It is centered slightly to the left of midline and has a mass effect upon the adjacent posterior paraspinal muscles. There is abnormal lucency in the underlying paraspinal muscles which could represent myositis. Disc levels: T11-12: No significant abnormality. Butterfly T12 vertebra. T12-L1: No significant abnormality. L1-2: Normal disc. Abnormal edema in the posterior paraspinal musculature with adjacent fluid collection in the subcutaneous fat of the posterior  aspect of the back as described above. L2-3: Normal disc. L3-4: Normal disc. Lucency in the posterior paraspinal soft tissues extends to the posterior aspect of the thecal sac on image 80 of series 4 but there is no discrete epidural abscess. L4-5: Normal disc.  No evidence of epidural abscess. L5-S1: Grade 1 spondylolisthesis. No disc bulging or protrusion. Bilateral pars defects. No visible epidural abscess. IMPRESSION: 1.  Extensive abnormal fluid collection in the subcutaneous fat of the midline of the back with underlying marked abnormality of the posterior paraspinal musculature from L1-2 through S3. This is worrisome for subcutaneous abscess and myositis. 2. Moth-eaten appearance of the spinous processes of L3 and L4 consistent with osteomyelitis. 3. No discrete epidural abscess. However, the abnormal edema in the paraspinal musculature extends to the posterior aspect of the spinal canal at L3-4. 4. MRI with and without contrast may better define the extent of the soft tissue and infection and could detect epidural extension that is not apparent on this unenhanced CT scan. Electronically Signed: By: Francene Boyers M.D. On: 03/20/2018 11:18   Mr Cervical Spine Wo Contrast  Result Date: 03/30/2018 CLINICAL DATA:  42 year old male with history of MSSA bacteremia with known endocarditis and epidural abscess extending from C2 through the sacrum. Recent episode of hypotension and acute lower extremity weakness. Evaluate abscess and possible spinal cord infarct. EXAM: MRI CERVICAL, THORACIC SPINE WITHOUT CONTRAST TECHNIQUE: Multiplanar and multiecho pulse sequences of the cervical spine, to include the craniocervical junction and cervicothoracic junction, and thoracic and lumbar spine, were obtained without intravenous contrast. COMPARISON:  Prior MRI from 03/22/2018. FINDINGS: MRI CERVICAL SPINE FINDINGS Alignment: Examination technically limited as the patient was unable to tolerate the full length of the exam.  Additionally, images provided are degraded by motion artifact. Reversal of the normal cervical lordosis with apex at C5, stable. No interval listhesis or malalignment. Vertebrae: Vertebral body height maintained without acute or interval fracture. Bone marrow signal intensity diffusely decreased on T1 weighted imaging, suspected to be related to anemia and chronic disease. No discrete osseous lesions. No abnormal marrow edema. No evidence for interval discitis. Cord: Signal intensity within the cervical spinal cord is within normal limits. No findings to suggest interval cord infarction on this motion degraded and limited exam. Previously identified epidural collection involving the ventral epidural space extending from C2 inferiorly is decreased in size, now measuring up to 7 mm in maximal AP diameter at the level of C2. Collection diffusely involves the ventral epidural space, but also is seen dorsally as well. Slightly improved diffuse spinal stenosis with thecal sac patency. Posterior Fossa, vertebral arteries, paraspinal tissues: Visualized brain and posterior fossa within normal limits. Craniocervical junction normal. Scattered edema within the posterior paraspinous soft tissues. Normal intravascular flow voids seen within the vertebral arteries bilaterally. Disc levels: C2-C3: Unremarkable. C3-C4: Small left foraminal protrusion with associated moderate left C4 foraminal stenosis. C4-C5:  Unremarkable. C5-C6: Left eccentric disc bulge with uncovertebral hypertrophy. Moderate left C6 foraminal narrowing. C6-C7: Right foraminal disc protrusion with associated moderate right C7 foraminal stenosis. C7-T1:  Unremarkable. MRI THORACIC SPINE FINDINGS Alignment: Examination markedly limited as the patient was unable to tolerate the full length of the exam. Sagittal T1, T2, and STIR sequences only were performed. No axial images obtained. Vertebral bodies normally aligned with preservation of the normal thoracic  kyphosis. Vertebrae: Vertebral body height maintained without evidence for interval fracture. Butterfly vertebra noted at T12. Diffusely decreased T1 weighted signal intensity throughout the visualized bone marrow, like related to anemia chronic disease. No findings to suggest interval or new discitis or septic arthritis. Cord: Signal intensity within the thoracic spinal cord grossly within normal limits on these limited sagittal views. No definite cord signal abnormality or cord edema to suggest acute spinal cord infarction. Previously seen diffuse epidural collection appears overall decreased in size from previous, with improved spinal stenosis and thecal sac patency. Paraspinal and other soft  tissues: Scattered edema seen within the posterior paraspinous soft tissues at the upper back/cervicothoracic junction. No discrete soft tissue collections. Disc levels: T5-6: Central disc protrusion indenting upon the ventral thoracic spinal cord, stable. IMPRESSION: 1. Technically limited exam due to motion artifact and the patient's inability to tolerate the full length of the exam. Cervical and thoracic spine only was imaged, and only sagittal sequences of the thoracic spine were obtained. 2. No imaging findings to suggest spinal cord infarction identified on this limited exam. 3. Interval improvement in diffuse epidural collection, decreased in size as compared to previous exam with improved diffuse spinal stenosis and thecal sac patency. No new discitis or facet arthritis. Electronically Signed   By: Rise Mu M.D.   On: 03/30/2018 22:52   Mr Thoracic Spine Wo Contrast  Result Date: 03/30/2018 CLINICAL DATA:  42 year old male with history of MSSA bacteremia with known endocarditis and epidural abscess extending from C2 through the sacrum. Recent episode of hypotension and acute lower extremity weakness. Evaluate abscess and possible spinal cord infarct. EXAM: MRI CERVICAL, THORACIC SPINE WITHOUT CONTRAST  TECHNIQUE: Multiplanar and multiecho pulse sequences of the cervical spine, to include the craniocervical junction and cervicothoracic junction, and thoracic and lumbar spine, were obtained without intravenous contrast. COMPARISON:  Prior MRI from 03/22/2018. FINDINGS: MRI CERVICAL SPINE FINDINGS Alignment: Examination technically limited as the patient was unable to tolerate the full length of the exam. Additionally, images provided are degraded by motion artifact. Reversal of the normal cervical lordosis with apex at C5, stable. No interval listhesis or malalignment. Vertebrae: Vertebral body height maintained without acute or interval fracture. Bone marrow signal intensity diffusely decreased on T1 weighted imaging, suspected to be related to anemia and chronic disease. No discrete osseous lesions. No abnormal marrow edema. No evidence for interval discitis. Cord: Signal intensity within the cervical spinal cord is within normal limits. No findings to suggest interval cord infarction on this motion degraded and limited exam. Previously identified epidural collection involving the ventral epidural space extending from C2 inferiorly is decreased in size, now measuring up to 7 mm in maximal AP diameter at the level of C2. Collection diffusely involves the ventral epidural space, but also is seen dorsally as well. Slightly improved diffuse spinal stenosis with thecal sac patency. Posterior Fossa, vertebral arteries, paraspinal tissues: Visualized brain and posterior fossa within normal limits. Craniocervical junction normal. Scattered edema within the posterior paraspinous soft tissues. Normal intravascular flow voids seen within the vertebral arteries bilaterally. Disc levels: C2-C3: Unremarkable. C3-C4: Small left foraminal protrusion with associated moderate left C4 foraminal stenosis. C4-C5:  Unremarkable. C5-C6: Left eccentric disc bulge with uncovertebral hypertrophy. Moderate left C6 foraminal narrowing.  C6-C7: Right foraminal disc protrusion with associated moderate right C7 foraminal stenosis. C7-T1:  Unremarkable. MRI THORACIC SPINE FINDINGS Alignment: Examination markedly limited as the patient was unable to tolerate the full length of the exam. Sagittal T1, T2, and STIR sequences only were performed. No axial images obtained. Vertebral bodies normally aligned with preservation of the normal thoracic kyphosis. Vertebrae: Vertebral body height maintained without evidence for interval fracture. Butterfly vertebra noted at T12. Diffusely decreased T1 weighted signal intensity throughout the visualized bone marrow, like related to anemia chronic disease. No findings to suggest interval or new discitis or septic arthritis. Cord: Signal intensity within the thoracic spinal cord grossly within normal limits on these limited sagittal views. No definite cord signal abnormality or cord edema to suggest acute spinal cord infarction. Previously seen diffuse epidural collection appears overall decreased  in size from previous, with improved spinal stenosis and thecal sac patency. Paraspinal and other soft tissues: Scattered edema seen within the posterior paraspinous soft tissues at the upper back/cervicothoracic junction. No discrete soft tissue collections. Disc levels: T5-6: Central disc protrusion indenting upon the ventral thoracic spinal cord, stable. IMPRESSION: 1. Technically limited exam due to motion artifact and the patient's inability to tolerate the full length of the exam. Cervical and thoracic spine only was imaged, and only sagittal sequences of the thoracic spine were obtained. 2. No imaging findings to suggest spinal cord infarction identified on this limited exam. 3. Interval improvement in diffuse epidural collection, decreased in size as compared to previous exam with improved diffuse spinal stenosis and thecal sac patency. No new discitis or facet arthritis. Electronically Signed   By: Rise Mu M.D.   On: 03/30/2018 22:52   Mr Cervical Spine W Wo Contrast  Result Date: 03/22/2018 CLINICAL DATA:  Spine infection. EXAM: MRI TOTAL SPINE WITHOUT AND WITH CONTRAST TECHNIQUE: Multisequence MR imaging of the spine from the cervical spine to the sacrum was performed prior to and following IV contrast administration. CONTRAST:  6 cc Gadavist intravenous COMPARISON:  CT of the thoracic and lumbar spine from 2 days ago FINDINGS: MRI CERVICAL SPINE FINDINGS Alignment: Normal Vertebrae: No evidence of osseous infection. Canal/Cord: There is extensive spinal fluid collection preferentially in the ventral but also in the right more than left dorsal canal. Subarachnoid space is diffusely effaced. Maximal thickness is posterior to C2 at 9 mm. No cord signal abnormality. Posterior Fossa, vertebral arteries, paraspinal tissues: No retropharyngeal or other discrete soft tissue collection. Disc levels: C2-3: Unremarkable. C3-4: Small left foraminal protrusion with moderate narrowing C4-5: Unremarkable. C5-6: Disc narrowing and bulging with asymmetric left uncovertebral spurring. Left foraminal impingement C6-7: Right foraminal protrusion mild narrowing. C7-T1:Unremarkable. MRI THORACIC SPINE FINDINGS Alignment:  Normal Vertebrae: No evidence of osteomyelitis or discitis. T12 butterfly vertebra. Canal/Cord: Cervical ventral epidural collection continues throughout the thoracic levels. There is also a focal dorsal component at T3-4 to T5-6, where thecal sac effacement is accentuated and there is cord flattening. No cord edema. Paraspinal and other soft tissues: Paraspinous phlegmon on the left at T8-T12, with new complex left pleural effusion and lower lobe atelectasis Disc levels: T5-6 central disc protrusion. MRI LUMBAR SPINE FINDINGS Segmentation:  5 lumbar type vertebral bodies Alignment:  Grade 1 anterolisthesis at L5-S1. Vertebrae: Marrow edema and heterogeneous enhancement within the L2, L3, and L4 spinous  processes. No discitis or facet edema. Conus medullaris: Extends to the L1 level and is non edematous. There is extensive epidural collection completely effacing the thecal sac throughout the lumbar spine until L4-5 and below where the collection becomes ventral and right eccentric. The infection communicates with extensive bilateral abscess within the intrinsic back muscles via the interspinous space at L3-4. Patient had recent subcutaneous collection and left buttocks collection drainage, with packing seen in place. This midline, upper subcutaneous collection communicates with the paravertebral abscess along its superior margin based on postcontrast axial images. Paraspinal and other soft tissues: As above.  Distended bladder Disc levels: Chronic bilateral pars defects at L5. Critical Value/emergent results were called by telephone at the time of interpretation on 03/22/2018 at 3:04 pm to Dr. Thedore Mins , who verbally acknowledged these results. IMPRESSION: 1. Epidural abscess from C2 to sacrum as described. Maximal cord compression from T3-4 to T5-6. The thecal sac is completely effaced from L1 to L4-5. At the L3-4 interspinous space the spinal abscess  communicates with large bilateral abscesses within the intrinsic back muscles. There is osteomyelitis of the L2, L3, and L4 spinous processes. No discitis or facet arthritis. 2. Left paravertebral abscess along the lower thoracic spine with small left empyema that is new from CT 2 days ago. 3. Distended bladder Electronically Signed   By: Marnee Spring M.D.   On: 03/22/2018 15:11   Mr Thoracic Spine W Wo Contrast  Result Date: 03/22/2018 CLINICAL DATA:  Spine infection. EXAM: MRI TOTAL SPINE WITHOUT AND WITH CONTRAST TECHNIQUE: Multisequence MR imaging of the spine from the cervical spine to the sacrum was performed prior to and following IV contrast administration. CONTRAST:  6 cc Gadavist intravenous COMPARISON:  CT of the thoracic and lumbar spine from 2 days  ago FINDINGS: MRI CERVICAL SPINE FINDINGS Alignment: Normal Vertebrae: No evidence of osseous infection. Canal/Cord: There is extensive spinal fluid collection preferentially in the ventral but also in the right more than left dorsal canal. Subarachnoid space is diffusely effaced. Maximal thickness is posterior to C2 at 9 mm. No cord signal abnormality. Posterior Fossa, vertebral arteries, paraspinal tissues: No retropharyngeal or other discrete soft tissue collection. Disc levels: C2-3: Unremarkable. C3-4: Small left foraminal protrusion with moderate narrowing C4-5: Unremarkable. C5-6: Disc narrowing and bulging with asymmetric left uncovertebral spurring. Left foraminal impingement C6-7: Right foraminal protrusion mild narrowing. C7-T1:Unremarkable. MRI THORACIC SPINE FINDINGS Alignment:  Normal Vertebrae: No evidence of osteomyelitis or discitis. T12 butterfly vertebra. Canal/Cord: Cervical ventral epidural collection continues throughout the thoracic levels. There is also a focal dorsal component at T3-4 to T5-6, where thecal sac effacement is accentuated and there is cord flattening. No cord edema. Paraspinal and other soft tissues: Paraspinous phlegmon on the left at T8-T12, with new complex left pleural effusion and lower lobe atelectasis Disc levels: T5-6 central disc protrusion. MRI LUMBAR SPINE FINDINGS Segmentation:  5 lumbar type vertebral bodies Alignment:  Grade 1 anterolisthesis at L5-S1. Vertebrae: Marrow edema and heterogeneous enhancement within the L2, L3, and L4 spinous processes. No discitis or facet edema. Conus medullaris: Extends to the L1 level and is non edematous. There is extensive epidural collection completely effacing the thecal sac throughout the lumbar spine until L4-5 and below where the collection becomes ventral and right eccentric. The infection communicates with extensive bilateral abscess within the intrinsic back muscles via the interspinous space at L3-4. Patient had recent  subcutaneous collection and left buttocks collection drainage, with packing seen in place. This midline, upper subcutaneous collection communicates with the paravertebral abscess along its superior margin based on postcontrast axial images. Paraspinal and other soft tissues: As above.  Distended bladder Disc levels: Chronic bilateral pars defects at L5. Critical Value/emergent results were called by telephone at the time of interpretation on 03/22/2018 at 3:04 pm to Dr. Thedore Mins , who verbally acknowledged these results. IMPRESSION: 1. Epidural abscess from C2 to sacrum as described. Maximal cord compression from T3-4 to T5-6. The thecal sac is completely effaced from L1 to L4-5. At the L3-4 interspinous space the spinal abscess communicates with large bilateral abscesses within the intrinsic back muscles. There is osteomyelitis of the L2, L3, and L4 spinous processes. No discitis or facet arthritis. 2. Left paravertebral abscess along the lower thoracic spine with small left empyema that is new from CT 2 days ago. 3. Distended bladder Electronically Signed   By: Marnee Spring M.D.   On: 03/22/2018 15:11   Mr Lumbar Spine W Wo Contrast  Result Date: 03/22/2018 CLINICAL DATA:  Spine infection. EXAM:  MRI TOTAL SPINE WITHOUT AND WITH CONTRAST TECHNIQUE: Multisequence MR imaging of the spine from the cervical spine to the sacrum was performed prior to and following IV contrast administration. CONTRAST:  6 cc Gadavist intravenous COMPARISON:  CT of the thoracic and lumbar spine from 2 days ago FINDINGS: MRI CERVICAL SPINE FINDINGS Alignment: Normal Vertebrae: No evidence of osseous infection. Canal/Cord: There is extensive spinal fluid collection preferentially in the ventral but also in the right more than left dorsal canal. Subarachnoid space is diffusely effaced. Maximal thickness is posterior to C2 at 9 mm. No cord signal abnormality. Posterior Fossa, vertebral arteries, paraspinal tissues: No retropharyngeal or  other discrete soft tissue collection. Disc levels: C2-3: Unremarkable. C3-4: Small left foraminal protrusion with moderate narrowing C4-5: Unremarkable. C5-6: Disc narrowing and bulging with asymmetric left uncovertebral spurring. Left foraminal impingement C6-7: Right foraminal protrusion mild narrowing. C7-T1:Unremarkable. MRI THORACIC SPINE FINDINGS Alignment:  Normal Vertebrae: No evidence of osteomyelitis or discitis. T12 butterfly vertebra. Canal/Cord: Cervical ventral epidural collection continues throughout the thoracic levels. There is also a focal dorsal component at T3-4 to T5-6, where thecal sac effacement is accentuated and there is cord flattening. No cord edema. Paraspinal and other soft tissues: Paraspinous phlegmon on the left at T8-T12, with new complex left pleural effusion and lower lobe atelectasis Disc levels: T5-6 central disc protrusion. MRI LUMBAR SPINE FINDINGS Segmentation:  5 lumbar type vertebral bodies Alignment:  Grade 1 anterolisthesis at L5-S1. Vertebrae: Marrow edema and heterogeneous enhancement within the L2, L3, and L4 spinous processes. No discitis or facet edema. Conus medullaris: Extends to the L1 level and is non edematous. There is extensive epidural collection completely effacing the thecal sac throughout the lumbar spine until L4-5 and below where the collection becomes ventral and right eccentric. The infection communicates with extensive bilateral abscess within the intrinsic back muscles via the interspinous space at L3-4. Patient had recent subcutaneous collection and left buttocks collection drainage, with packing seen in place. This midline, upper subcutaneous collection communicates with the paravertebral abscess along its superior margin based on postcontrast axial images. Paraspinal and other soft tissues: As above.  Distended bladder Disc levels: Chronic bilateral pars defects at L5. Critical Value/emergent results were called by telephone at the time of  interpretation on 03/22/2018 at 3:04 pm to Dr. Thedore Mins , who verbally acknowledged these results. IMPRESSION: 1. Epidural abscess from C2 to sacrum as described. Maximal cord compression from T3-4 to T5-6. The thecal sac is completely effaced from L1 to L4-5. At the L3-4 interspinous space the spinal abscess communicates with large bilateral abscesses within the intrinsic back muscles. There is osteomyelitis of the L2, L3, and L4 spinous processes. No discitis or facet arthritis. 2. Left paravertebral abscess along the lower thoracic spine with small left empyema that is new from CT 2 days ago. 3. Distended bladder Electronically Signed   By: Marnee Spring M.D.   On: 03/22/2018 15:11   Dg Chest Port 1 View  Result Date: 03/30/2018 CLINICAL DATA:  Shortness of breath EXAM: PORTABLE CHEST 1 VIEW COMPARISON:  03/30/2018 at 0227 hours FINDINGS: Moderate layering left pleural effusion, increased. Left lower lobe opacity, atelectasis versus pneumonia. Right lung is clear.  No pneumothorax. The heart is normal in size. IMPRESSION: Moderate layering left pleural effusion, increased. Left lower lobe opacity, atelectasis versus pneumonia. Electronically Signed   By: Charline Bills M.D.   On: 03/30/2018 19:06   Dg Chest Port 1 View  Result Date: 03/30/2018 CLINICAL DATA:  Chest pain EXAM:  PORTABLE CHEST 1 VIEW COMPARISON:  None. FINDINGS: Retrocardiac opacity, atelectasis versus pneumonia. Possible small left pleural effusion. Right lung is clear. No pneumothorax. The heart is normal in size. IMPRESSION: Retrocardiac opacity, atelectasis versus pneumonia. Possible small left pleural effusion. Electronically Signed   By: Charline Bills M.D.   On: 03/30/2018 02:49   [place] Ekg Site Rite  Result Date: 04/04/2018 If Site Rite image not attached, placement could not be confirmed due to current cardiac rhythm.  [place] Ekg Site Rite  Result Date: 03/21/2018 If Site Rite image not attached, placement could not be  confirmed due to current cardiac rhythm.    LOS: 25 days   Signature  Susa Raring M.D on 04/14/2018 at 9:54 AM  -  To page go to [email or web address] - password [email or web address]

## 2018-04-15 LAB — CBC
HCT: 26.5 % — ABNORMAL LOW (ref 39.0–52.0)
Hemoglobin: 8 g/dL — ABNORMAL LOW (ref 13.0–17.0)
MCH: 28.8 pg (ref 26.0–34.0)
MCHC: 30.2 g/dL (ref 30.0–36.0)
MCV: 95.3 fL (ref 80.0–100.0)
NRBC: 0 % (ref 0.0–0.2)
Platelets: 400 10*3/uL (ref 150–400)
RBC: 2.78 MIL/uL — ABNORMAL LOW (ref 4.22–5.81)
RDW: 13.3 % (ref 11.5–15.5)
WBC: 7.6 10*3/uL (ref 4.0–10.5)

## 2018-04-15 LAB — BASIC METABOLIC PANEL
ANION GAP: 7 (ref 5–15)
CALCIUM: 8.1 mg/dL — AB (ref 8.9–10.3)
CO2: 29 mmol/L (ref 22–32)
CREATININE: 0.63 mg/dL (ref 0.61–1.24)
Chloride: 102 mmol/L (ref 98–111)
GFR calc Af Amer: >60 mL/min (ref >60)
Glucose, Bld: 146 mg/dL — ABNORMAL HIGH (ref 70–99)
Potassium: 3.4 mmol/L — ABNORMAL LOW (ref 3.5–5.1)
SODIUM: 138 mmol/L (ref 135–145)

## 2018-04-15 LAB — MAGNESIUM: MAGNESIUM: 1.8 mg/dL (ref 1.7–2.4)

## 2018-04-15 LAB — GLUCOSE, CAPILLARY
GLUCOSE-CAPILLARY: 193 mg/dL — AB (ref 70–99)
Glucose-Capillary: 127 mg/dL — ABNORMAL HIGH (ref 70–99)
Glucose-Capillary: 133 mg/dL — ABNORMAL HIGH (ref 70–99)
Glucose-Capillary: 239 mg/dL — ABNORMAL HIGH (ref 70–99)

## 2018-04-15 MED ORDER — POTASSIUM CHLORIDE CRYS ER 20 MEQ PO TBCR
40.0000 meq | EXTENDED_RELEASE_TABLET | Freq: Once | ORAL | Status: AC
  Administered 2018-04-15: 40 meq via ORAL
  Filled 2018-04-15: qty 2

## 2018-04-15 MED ORDER — SPIRONOLACTONE 25 MG PO TABS
25.0000 mg | ORAL_TABLET | Freq: Every day | ORAL | Status: DC
  Administered 2018-04-15 – 2018-04-17 (×3): 25 mg via ORAL
  Filled 2018-04-15 (×3): qty 1

## 2018-04-15 NOTE — Progress Notes (Signed)
PROGRESS NOTE        PATIENT DETAILS  Name: Tony Long  Age: 42 y.o.  Sex: male Date of Birth: 1976-01-18 Admit Date: 03/20/2018 Admitting Physician Kendell Bane, MD  ZOX:[id number], Rolm Gala, FNP   Brief Narrative:  [name] a 42 y.o. male history of IVDA, DM-2, hypertension admitted for worsening back pain-further evaluation revealed MSSA bacteremia with tricuspid valve endocarditis, epidural abscess involving C2 through sacrum, and numerous soft tissue back abscesses.  Evaluated by general surgery-underwent I&D of her lower back soft tissue abscess, evaluated by neurosurgery-not felt to be a candidate for decompressive surgery due to extensive nature of the disease and lack of any significant neurological findings.  ID following He was initially kept on IV cefazolin and then antibiotic coverage was broadened to Vancomycin and Rocephin on 03/30/2018 by ID.   Currently on IV nafcillin on 04/16/2018, thereafter 4 weeks of Keflex 500 p.o. twice daily per ID.Marland Kitchen   See below for further details   Subjective:  Patient in bed, appears comfortable, denies any headache, no fever, no chest pain or pressure, no shortness of breath , no abdominal pain. No focal weakness.   Assessment/Plan:   MSSA Bacteremia with tricuspid valve endocarditis, extensive epidural abscess (C2 through sacrum) and large soft tissue lower back abscess  : due to extensive nature of the epidural abscess - neurosurgery does not recommend decompressive surgery. Seen by ID and current recommendation noted, he was initially kept on IV cefazolin and then antibiotic coverage was broadened to Vancomycin and Rocephin on 03/30/2018 by ID. Currently on IV nafcillin until 04/16/2018, thereafter 4 weeks of Keflex 500 p.o. twice daily per ID.. D/w Dr Jolayne Haines on 04/09/18, he confirms antibiotic plan above. Given history of IVDA-not a candidate for outpatient IV Abx therapy. He has received a PICC line,  continue IV antibiotics through PICC Line.  Patient doing fairly well.    Cocaine/Substance abuse: Withdrawal symptoms have resolved, continue  Suboxone, he does not want short-acting pain medications anymore.  Polysubstance abuse/IVDA: Counseled to quit.  Anemia.  Normocytic, he had some chronic anemia upon admission as well and now worse due to him dilution from IV fluids also some element of iron deficiency per anemia panel likely due to multiple blood draws.  No signs of ongoing bleeding or acute blood loss, s/p  1 unit of packed RBC on 04/06/2018 and monitor, hgb is stable, will check CBCs on Tuesdays Thursdays and Saturdays.  Mild lower extremity leg edema. improving with TED stockings.  New onset of left-sided pleuritic chest pain starting on late night 03/29/2018.  Nonspecific EKG changes stable on combination of aspirin, beta-blocker and statin.  Echocardiogram shows no wall motion abnormality and preserved EF, patient remains chest pain-free.  Hypertension: Monitor on beta-blocker.  Acute urinary retention: Continue Foley and Flomax, trial of Foley removal failed on 04/03/2018, likely combination of lack of activity and spine infection, failed voiding trial again on 04/09/2018, Foley reinserted and 1450 mL of urine removed, ,will need outpatient follow-up with urology discharge.  Recurrent hypokalemia  . Replace check Ur K, add Aldactone.  DM-2: Poor outpatient control, last A1c 12.6, continue Lantus 25 units nightly and sliding scale.   CBG (last 3)  Recent Labs    04/14/18 1707 04/14/18 2206 04/15/18 0822  GLUCAP 203* 281* 127*   Lab Results  Component Value Date  HGBA1C 12.6 (A) 02/26/2018     DVT Prophylaxis: Prophylactic heparin  Code Status: Full code  Disposition Plan: Home after completing IV antibiotics ...04/16/18 or 04/17/18  Antimicrobial agents: Anti-infectives (From admission, onward)   Start     Dose/Rate Route Frequency Ordered Stop   04/01/18  1300  nafcillin 2 g in sodium chloride 0.9 % 100 mL IVPB     2 g 200 mL/hr over 30 Minutes Intravenous Every 4 hours 04/01/18 1242     04/01/18 1200  nafcillin injection 2 g  Status:  Discontinued     2 g Intravenous Every 4 hours 04/01/18 1129 04/01/18 1242   03/31/18 0200  vancomycin (VANCOCIN) IVPB 750 mg/150 ml premix  Status:  Discontinued     750 mg 150 mL/hr over 60 Minutes Intravenous Every 8 hours 03/30/18 1628 04/01/18 1129   03/31/18 0100  meropenem (MERREM) 1 g in sodium chloride 0.9 % 100 mL IVPB  Status:  Discontinued     1 g 200 mL/hr over 30 Minutes Intravenous Every 8 hours 03/30/18 1628 04/01/18 1129   03/30/18 1630  vancomycin (VANCOCIN) 1,500 mg in sodium chloride 0.9 % 500 mL IVPB     1,500 mg 250 mL/hr over 120 Minutes Intravenous NOW 03/30/18 1619 03/30/18 1925   03/30/18 1630  meropenem (MERREM) 2 g in sodium chloride 0.9 % 100 mL IVPB     2 g 200 mL/hr over 30 Minutes Intravenous NOW 03/30/18 1619 03/30/18 1754   03/21/18 0930  ceFAZolin (ANCEF) IVPB 2g/100 mL premix  Status:  Discontinued     2 g 200 mL/hr over 30 Minutes Intravenous Every 8 hours 03/21/18 0920 03/30/18 1616      Procedures:   MRI C, T and L-spine done 03/30/2018 and 03/31/2018.    CT angiogram chest 03/30/2018.  1. No pulmonary embolus. 2. Moderate loculated left pleural effusion. Adjacent airspace disease likely compressive atelectasis, pneumonia not excluded in the setting of spinal infection and endocarditis. Mild right pleural thickening or loculated pleural fluid in the upper right hemithorax.  Repeat echocardiogram 03/30/2018 - Left ventricle: The cavity size was normal. Wall thickness was normal. Systolic function was normal. The estimated ejection fraction was in the range of 60% to 65%. Wall motion was normal; there were no regional wall motion abnormalities. Left ventricular diastolic function parameters were normal.  TEE - - Left ventricle: The cavity size was normal. Wall thickness  wasnormal. Systolic function was normal. The estimated ejectionfraction was in the range of 60% to 65%. - Aortic valve: No evidence of vegetation. - Mitral valve: No evidence of vegetation. - Left atrium: No evidence of thrombus in the atrial cavity or appendage. No evidence of thrombus in the appendage. - Right atrium: No evidence of thrombus in the atrial cavity orappendage. - Tricuspid valve: There was a vegetation. There was a small (1.2cmx .6cm), mobile vegetation on the septal leaflet. There was mildregurgitation. - Pulmonic valve: No evidence of vegetation.  Impressions:   Tricuspid valve endocarditis. Discussed with primary team   MRI C-T-L Spine - 1. Epidural abscess from C2 to sacrum as described. Maximal cord compression from T3-4 to T5-6. The thecal sac is completely effaced from L1 to L4-5. At the L3-4 interspinous space the spinal abscess communicates with large bilateral abscesses within the intrinsic back muscles. There is osteomyelitis of the L2, L3, and L4 spinous processes. No discitis or facet arthritis. 2. Left paravertebral abscess along the lower thoracic spine with small left empyema that is new  from CT 2 days ago. 3. Distended bladder  CT - 1. Evidence of cellulitis involving the right side of the base of the neck extending into the right supraclavicular region with fluid and gas in the soft tissues at the base of the right side of the neck. 2. No significant abnormality of the thoracic spine. 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  CT -  1. Extensive abnormal fluid collection in the subcutaneous fat of the midline of the back with underlying marked abnormality of the posterior paraspinal musculature from L1-2 through S3. This is worrisome for subcutaneous abscess and myositis. 2. Moth-eaten appearance of the spinous processes of L3 and L4 consistent with osteomyelitis. 3. No discrete epidural abscess. However, the abnormal edema in the paraspinal musculature  extends to the posterior aspect of the spinal canal at L3-4. 4. MRI with and without contrast may better define the extent of the soft tissue and infection and could detect epidural extension that is not apparent on this unenhanced CT scan.  9/22>> PICC line  I&D by [facility] of back soft tissue abscess on -  03/22/18  CONSULTS:  N.Surgery, ID, general surgery, PCCM    MEDICATIONS:  Scheduled Meds:  . aspirin  81 mg Oral Daily  . atorvastatin  40 mg Oral q1800  . buprenorphine-naloxone  2 tablet Sublingual Daily  . feeding supplement (PRO-STAT SUGAR FREE 64)  30 mL Oral TID WC  . gabapentin  300 mg Oral TID  . heparin injection (subcutaneous)  5,000 Units Subcutaneous Q8H  . insulin aspart  0-5 Units Subcutaneous QHS  . insulin aspart  0-9 Units Subcutaneous TID WC  . insulin aspart  4 Units Subcutaneous TID WC  . insulin glargine  25 Units Subcutaneous QHS  . metoprolol tartrate  50 mg Oral BID  . nicotine  21 mg Transdermal Daily  . polyethylene glycol  17 g Oral BID  . potassium chloride  40 mEq Oral Once  . senna-docusate  2 tablet Oral QHS  . spironolactone  25 mg Oral Daily  . tamsulosin  0.4 mg Oral BID   Continuous Infusions: . nafcillin IV 2 g (04/15/18 0328)   PRN Meds:.acetaminophen, bisacodyl, clonazepam, cyclobenzaprine, hydrALAZINE, LORazepam, magnesium hydroxide, metoprolol tartrate, nitroGLYCERIN, sodium chloride flush, sodium phosphate   PHYSICAL EXAM: Vital signs: Vitals:   04/14/18 1504 04/14/18 2207 04/15/18 0624 04/15/18 1005  BP: 117/73 129/77 129/78 (!) 143/91  Pulse: 98 (!) 108 82 93  Resp: 18     Temp: 98.8 F (37.1 C) 98.7 F (37.1 C) 98.9 F (37.2 C)   TempSrc: Oral Oral Oral   SpO2: 91% 96% 96%   Weight:      Height:       Filed Weights   03/21/18 0602  Weight: 68 kg   Body mass index is 20.92 kg/m.      Physical Exam  Awake Alert, Oriented X 3, No new F.N deficits, Normal affect .AT,PERRAL Supple Neck,No JVD, No cervical  lymphadenopathy appriciated.  Symmetrical Chest wall movement, Good air movement bilaterally, CTAB RRR,No Gallops, Rubs or new Murmurs, No Parasternal Heave +ve B.Sounds, Abd Soft, No tenderness, No organomegaly appriciated, No rebound - guarding or rigidity. No Cyanosis, Clubbing or edema, No new Rash or bruise. foley in place   LABORATORY DATA: CBC: Recent Labs  Lab 04/09/18 0317 04/10/18 0812 04/13/18 0814 04/15/18 0800  WBC 8.9 8.7 8.5 7.6  HGB 7.8* 7.8* 8.5* 8.0*  HCT 25.8* 26.7* 27.7* 26.5*  MCV 97.7 99.3 95.2 95.3  PLT 367 384 486* 400    Basic Metabolic Panel: Recent Labs  Lab 04/10/18 0812 04/13/18 0814 04/15/18 0359 04/15/18 0800  NA 137 139  --  138  K 3.6 3.4*  --  3.4*  CL 99 103  --  102  CO2 30 28  --  29  GLUCOSE 245* 145*  --  146*  BUN 5* 6  --  <5*  CREATININE 0.72 0.65  --  0.63  CALCIUM 7.9* 8.3*  --  8.1*  MG  --   --  1.8  --     GFR: Estimated Creatinine Clearance: 116.9 mL/min (by C-G formula based on SCr of 0.63 mg/dL).  Liver Function Tests: No results for input(s): AST, ALT, ALKPHOS, BILITOT, PROT, ALBUMIN in the last 168 hours. No results for input(s): LIPASE, AMYLASE in the last 168 hours. No results for input(s): AMMONIA in the last 168 hours.  Coagulation Profile: No results for input(s): INR, PROTIME in the last 168 hours.  Cardiac Enzymes: No results for input(s): CKTOTAL, CKMB, CKMBINDEX, TROPONINI in the last 168 hours.  BNP (last 3 results) No results for input(s): PROBNP in the last 8760 hours.  HbA1C: No results for input(s): HGBA1C in the last 72 hours.  CBG: Recent Labs  Lab 04/14/18 0752 04/14/18 1154 04/14/18 1707 04/14/18 2206 04/15/18 0822  GLUCAP 112* 110* 203* 281* 127*    Lipid Profile: No results for input(s): CHOL, HDL, LDLCALC, TRIG, CHOLHDL, LDLDIRECT in the last 72 hours.  Thyroid Function Tests: No results for input(s): TSH, T4TOTAL, FREET4, T3FREE, THYROIDAB in the last 72  hours.  Anemia Panel: No results for input(s): VITAMINB12, FOLATE, FERRITIN, TIBC, IRON, RETICCTPCT in the last 72 hours.  Urine analysis:    Component Value Date/Time   COLORURINE YELLOW 03/30/2018 1930   APPEARANCEUR CLEAR 03/30/2018 1930   LABSPEC 1.009 03/30/2018 1930   PHURINE 6.0 03/30/2018 1930   GLUCOSEU 50 (A) 03/30/2018 1930   HGBUR SMALL (A) 03/30/2018 1930   BILIRUBINUR NEGATIVE 03/30/2018 1930   BILIRUBINUR small 02/26/2018 1202   KETONESUR NEGATIVE 03/30/2018 1930   PROTEINUR NEGATIVE 03/30/2018 1930   UROBILINOGEN 1.0 02/26/2018 1202   UROBILINOGEN 1.0 12/11/2016 1020   NITRITE NEGATIVE 03/30/2018 1930   LEUKOCYTESUR TRACE (A) 03/30/2018 1930    Sepsis Labs: Lactic Acid, Venous    Component Value Date/Time   LATICACIDVEN 1.2 03/30/2018 1837    MICROBIOLOGY: No results found for this or any previous visit (from the past 240 hour(s)).  RADIOLOGY STUDIES/RESULTS:  Ct Angio Chest Pe W Or Wo Contrast  Result Date: 03/30/2018 CLINICAL DATA:  Shortness of breath PE suspected, high pretest prob. Patient with known epidural abscess and endocarditis. Now with left-sided chest pain. EXAM: CT ANGIOGRAPHY CHEST WITH CONTRAST TECHNIQUE: Multidetector CT imaging of the chest was performed using the standard protocol during bolus administration of intravenous contrast. Multiplanar CT image reconstructions and MIPs were obtained to evaluate the vascular anatomy. CONTRAST:  54mL ISOVUE-370 IOPAMIDOL (ISOVUE-370) INJECTION 76% COMPARISON:  Chest radiographs earlier this day. FINDINGS: Cardiovascular: There are no filling defects within the pulmonary arteries to suggest pulmonary embolus. The thoracic aorta is normal in caliber without dissection. Mild cardiomegaly. Small amount pericardial fluid. Mediastinum/Nodes: Loculated left pleural effusion tracks along the left aspect of the mediastinum. Small left hilar nodes without bulky adenopathy. No enlarged mediastinal nodes. No  dominant thyroid nodule. The esophagus is decompressed. Lungs/Pleura: Moderate left pleural effusion is loculated and tracks along the posterior, lateral, and medial hemithorax. Adjacent  airspace disease in the left lower lobe, favoring compressive atelectasis, with adjacent compressive atelectasis in the right upper lobe. Pleural thickening versus loculated pleural effusion the upper medial right hemithorax, with additional areas of pleural thickening dependently. Dependent atelectasis in the right lower lobe. Trachea and bronchi are patent. Upper Abdomen: No acute findings. Musculoskeletal: Patient with known epidural abscess, not well delineated by CT. No bony destructive change. Review of the MIP images confirms the above findings. IMPRESSION: 1. No pulmonary embolus. 2. Moderate loculated left pleural effusion. Adjacent airspace disease likely compressive atelectasis, pneumonia not excluded in the setting of spinal infection and endocarditis. Mild right pleural thickening or loculated pleural fluid in the upper right hemithorax. Electronically Signed   By: Narda Rutherford M.D.   On: 03/30/2018 23:21   Ct Thoracic Spine Wo Contrast  Result Date: 03/20/2018 CLINICAL DATA:  Progressive back pain.  Swelling of the lower back. EXAM: CT THORACIC SPINE WITHOUT CONTRAST TECHNIQUE: Multidetector CT images of the thoracic were obtained using the standard protocol without intravenous contrast. COMPARISON:  None. FINDINGS: Alignment: Normal. 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 Paraspinal and other soft tissues: There is abnormal gas and fluid in the soft tissues of the right side of the base of the neck and in the right supraclavicular region. Paraspinal soft tissues appear normal throughout the thoracic spine. Disc levels: There is no evidence of disc protrusion or significant disc bulging or spinal or foraminal stenosis or other significant abnormality of the thoracic spine. IMPRESSION: 1. Evidence of  cellulitis involving the right side of the base of the neck extending into the right supraclavicular region with fluid and gas in the soft tissues at the base of the right side of the neck. 2. No significant abnormality of the thoracic spine. 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 Electronically Signed   By: Francene Boyers M.D.   On: 03/20/2018 11:25   Ct Lumbar Spine Wo Contrast  Addendum Date: 03/20/2018   ADDENDUM REPORT: 03/20/2018 11:44 ADDENDUM: Critical Value/emergent results were called by telephone at the time of interpretation on 03/20/2018 at 11:30 am to Dr. Sharyn Creamer , who verbally acknowledged these results. Electronically Signed   By: Francene Boyers M.D.   On: 03/20/2018 11:44   Result Date: 03/20/2018 CLINICAL DATA:  Increasing low back pain and soft tissue swelling. EXAM: CT LUMBAR SPINE WITHOUT CONTRAST TECHNIQUE: Multidetector CT imaging of the lumbar spine was performed without intravenous contrast administration. Multiplanar CT image reconstructions were also generated. IV contrast could not be utilized due to the lack of an appropriate IV. COMPARISON:  None. FINDINGS: Segmentation: 5 lumbar type vertebrae. Alignment: Normal. Vertebrae: There is a moth-eaten appearance of the spinous processes of L3 and L4 which is worrisome for osteomyelitis. Bilateral pars defects at L5 with grade 1 spondylolisthesis. 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 Paraspinal and other soft tissues: There is an extensive abnormal fluid collection in the subcutaneous soft tissues of the posterior aspect of the back extending from approximately L1-2 to S3. This fluid collection is lobulated and measures approximately 20 x 9 x 2.5 cm. It is centered slightly to the left of midline and has a mass effect upon the adjacent posterior paraspinal muscles. There is abnormal lucency in the underlying paraspinal muscles which could represent myositis. Disc levels: T11-12: No significant abnormality. Butterfly T12 vertebra.  T12-L1: No significant abnormality. L1-2: Normal disc. Abnormal edema in the posterior paraspinal musculature with adjacent fluid collection in the subcutaneous fat of the posterior aspect of the back as  described above. L2-3: Normal disc. L3-4: Normal disc. Lucency in the posterior paraspinal soft tissues extends to the posterior aspect of the thecal sac on image 80 of series 4 but there is no discrete epidural abscess. L4-5: Normal disc.  No evidence of epidural abscess. L5-S1: Grade 1 spondylolisthesis. No disc bulging or protrusion. Bilateral pars defects. No visible epidural abscess. IMPRESSION: 1. Extensive abnormal fluid collection in the subcutaneous fat of the midline of the back with underlying marked abnormality of the posterior paraspinal musculature from L1-2 through S3. This is worrisome for subcutaneous abscess and myositis. 2. Moth-eaten appearance of the spinous processes of L3 and L4 consistent with osteomyelitis. 3. No discrete epidural abscess. However, the abnormal edema in the paraspinal musculature extends to the posterior aspect of the spinal canal at L3-4. 4. MRI with and without contrast may better define the extent of the soft tissue and infection and could detect epidural extension that is not apparent on this unenhanced CT scan. Electronically Signed: By: Francene Boyers M.D. On: 03/20/2018 11:18   Mr Cervical Spine Wo Contrast  Result Date: 03/30/2018 CLINICAL DATA:  42 year old male with history of MSSA bacteremia with known endocarditis and epidural abscess extending from C2 through the sacrum. Recent episode of hypotension and acute lower extremity weakness. Evaluate abscess and possible spinal cord infarct. EXAM: MRI CERVICAL, THORACIC SPINE WITHOUT CONTRAST TECHNIQUE: Multiplanar and multiecho pulse sequences of the cervical spine, to include the craniocervical junction and cervicothoracic junction, and thoracic and lumbar spine, were obtained without intravenous contrast.  COMPARISON:  Prior MRI from 03/22/2018. FINDINGS: MRI CERVICAL SPINE FINDINGS Alignment: Examination technically limited as the patient was unable to tolerate the full length of the exam. Additionally, images provided are degraded by motion artifact. Reversal of the normal cervical lordosis with apex at C5, stable. No interval listhesis or malalignment. Vertebrae: Vertebral body height maintained without acute or interval fracture. Bone marrow signal intensity diffusely decreased on T1 weighted imaging, suspected to be related to anemia and chronic disease. No discrete osseous lesions. No abnormal marrow edema. No evidence for interval discitis. Cord: Signal intensity within the cervical spinal cord is within normal limits. No findings to suggest interval cord infarction on this motion degraded and limited exam. Previously identified epidural collection involving the ventral epidural space extending from C2 inferiorly is decreased in size, now measuring up to 7 mm in maximal AP diameter at the level of C2. Collection diffusely involves the ventral epidural space, but also is seen dorsally as well. Slightly improved diffuse spinal stenosis with thecal sac patency. Posterior Fossa, vertebral arteries, paraspinal tissues: Visualized brain and posterior fossa within normal limits. Craniocervical junction normal. Scattered edema within the posterior paraspinous soft tissues. Normal intravascular flow voids seen within the vertebral arteries bilaterally. Disc levels: C2-C3: Unremarkable. C3-C4: Small left foraminal protrusion with associated moderate left C4 foraminal stenosis. C4-C5:  Unremarkable. C5-C6: Left eccentric disc bulge with uncovertebral hypertrophy. Moderate left C6 foraminal narrowing. C6-C7: Right foraminal disc protrusion with associated moderate right C7 foraminal stenosis. C7-T1:  Unremarkable. MRI THORACIC SPINE FINDINGS Alignment: Examination markedly limited as the patient was unable to tolerate the  full length of the exam. Sagittal T1, T2, and STIR sequences only were performed. No axial images obtained. Vertebral bodies normally aligned with preservation of the normal thoracic kyphosis. Vertebrae: Vertebral body height maintained without evidence for interval fracture. Butterfly vertebra noted at T12. Diffusely decreased T1 weighted signal intensity throughout the visualized bone marrow, like related to anemia chronic disease. No findings  to suggest interval or new discitis or septic arthritis. Cord: Signal intensity within the thoracic spinal cord grossly within normal limits on these limited sagittal views. No definite cord signal abnormality or cord edema to suggest acute spinal cord infarction. Previously seen diffuse epidural collection appears overall decreased in size from previous, with improved spinal stenosis and thecal sac patency. Paraspinal and other soft tissues: Scattered edema seen within the posterior paraspinous soft tissues at the upper back/cervicothoracic junction. No discrete soft tissue collections. Disc levels: T5-6: Central disc protrusion indenting upon the ventral thoracic spinal cord, stable. IMPRESSION: 1. Technically limited exam due to motion artifact and the patient's inability to tolerate the full length of the exam. Cervical and thoracic spine only was imaged, and only sagittal sequences of the thoracic spine were obtained. 2. No imaging findings to suggest spinal cord infarction identified on this limited exam. 3. Interval improvement in diffuse epidural collection, decreased in size as compared to previous exam with improved diffuse spinal stenosis and thecal sac patency. No new discitis or facet arthritis. Electronically Signed   By: Rise Mu M.D.   On: 03/30/2018 22:52   Mr Thoracic Spine Wo Contrast  Result Date: 03/30/2018 CLINICAL DATA:  42 year old male with history of MSSA bacteremia with known endocarditis and epidural abscess extending from C2  through the sacrum. Recent episode of hypotension and acute lower extremity weakness. Evaluate abscess and possible spinal cord infarct. EXAM: MRI CERVICAL, THORACIC SPINE WITHOUT CONTRAST TECHNIQUE: Multiplanar and multiecho pulse sequences of the cervical spine, to include the craniocervical junction and cervicothoracic junction, and thoracic and lumbar spine, were obtained without intravenous contrast. COMPARISON:  Prior MRI from 03/22/2018. FINDINGS: MRI CERVICAL SPINE FINDINGS Alignment: Examination technically limited as the patient was unable to tolerate the full length of the exam. Additionally, images provided are degraded by motion artifact. Reversal of the normal cervical lordosis with apex at C5, stable. No interval listhesis or malalignment. Vertebrae: Vertebral body height maintained without acute or interval fracture. Bone marrow signal intensity diffusely decreased on T1 weighted imaging, suspected to be related to anemia and chronic disease. No discrete osseous lesions. No abnormal marrow edema. No evidence for interval discitis. Cord: Signal intensity within the cervical spinal cord is within normal limits. No findings to suggest interval cord infarction on this motion degraded and limited exam. Previously identified epidural collection involving the ventral epidural space extending from C2 inferiorly is decreased in size, now measuring up to 7 mm in maximal AP diameter at the level of C2. Collection diffusely involves the ventral epidural space, but also is seen dorsally as well. Slightly improved diffuse spinal stenosis with thecal sac patency. Posterior Fossa, vertebral arteries, paraspinal tissues: Visualized brain and posterior fossa within normal limits. Craniocervical junction normal. Scattered edema within the posterior paraspinous soft tissues. Normal intravascular flow voids seen within the vertebral arteries bilaterally. Disc levels: C2-C3: Unremarkable. C3-C4: Small left foraminal  protrusion with associated moderate left C4 foraminal stenosis. C4-C5:  Unremarkable. C5-C6: Left eccentric disc bulge with uncovertebral hypertrophy. Moderate left C6 foraminal narrowing. C6-C7: Right foraminal disc protrusion with associated moderate right C7 foraminal stenosis. C7-T1:  Unremarkable. MRI THORACIC SPINE FINDINGS Alignment: Examination markedly limited as the patient was unable to tolerate the full length of the exam. Sagittal T1, T2, and STIR sequences only were performed. No axial images obtained. Vertebral bodies normally aligned with preservation of the normal thoracic kyphosis. Vertebrae: Vertebral body height maintained without evidence for interval fracture. Butterfly vertebra noted at T12. Diffusely decreased T1  weighted signal intensity throughout the visualized bone marrow, like related to anemia chronic disease. No findings to suggest interval or new discitis or septic arthritis. Cord: Signal intensity within the thoracic spinal cord grossly within normal limits on these limited sagittal views. No definite cord signal abnormality or cord edema to suggest acute spinal cord infarction. Previously seen diffuse epidural collection appears overall decreased in size from previous, with improved spinal stenosis and thecal sac patency. Paraspinal and other soft tissues: Scattered edema seen within the posterior paraspinous soft tissues at the upper back/cervicothoracic junction. No discrete soft tissue collections. Disc levels: T5-6: Central disc protrusion indenting upon the ventral thoracic spinal cord, stable. IMPRESSION: 1. Technically limited exam due to motion artifact and the patient's inability to tolerate the full length of the exam. Cervical and thoracic spine only was imaged, and only sagittal sequences of the thoracic spine were obtained. 2. No imaging findings to suggest spinal cord infarction identified on this limited exam. 3. Interval improvement in diffuse epidural collection,  decreased in size as compared to previous exam with improved diffuse spinal stenosis and thecal sac patency. No new discitis or facet arthritis. Electronically Signed   By: Rise Mu M.D.   On: 03/30/2018 22:52   Mr Cervical Spine W Wo Contrast  Result Date: 03/22/2018 CLINICAL DATA:  Spine infection. EXAM: MRI TOTAL SPINE WITHOUT AND WITH CONTRAST TECHNIQUE: Multisequence MR imaging of the spine from the cervical spine to the sacrum was performed prior to and following IV contrast administration. CONTRAST:  6 cc Gadavist intravenous COMPARISON:  CT of the thoracic and lumbar spine from 2 days ago FINDINGS: MRI CERVICAL SPINE FINDINGS Alignment: Normal Vertebrae: No evidence of osseous infection. Canal/Cord: There is extensive spinal fluid collection preferentially in the ventral but also in the right more than left dorsal canal. Subarachnoid space is diffusely effaced. Maximal thickness is posterior to C2 at 9 mm. No cord signal abnormality. Posterior Fossa, vertebral arteries, paraspinal tissues: No retropharyngeal or other discrete soft tissue collection. Disc levels: C2-3: Unremarkable. C3-4: Small left foraminal protrusion with moderate narrowing C4-5: Unremarkable. C5-6: Disc narrowing and bulging with asymmetric left uncovertebral spurring. Left foraminal impingement C6-7: Right foraminal protrusion mild narrowing. C7-T1:Unremarkable. MRI THORACIC SPINE FINDINGS Alignment:  Normal Vertebrae: No evidence of osteomyelitis or discitis. T12 butterfly vertebra. Canal/Cord: Cervical ventral epidural collection continues throughout the thoracic levels. There is also a focal dorsal component at T3-4 to T5-6, where thecal sac effacement is accentuated and there is cord flattening. No cord edema. Paraspinal and other soft tissues: Paraspinous phlegmon on the left at T8-T12, with new complex left pleural effusion and lower lobe atelectasis Disc levels: T5-6 central disc protrusion. MRI LUMBAR SPINE  FINDINGS Segmentation:  5 lumbar type vertebral bodies Alignment:  Grade 1 anterolisthesis at L5-S1. Vertebrae: Marrow edema and heterogeneous enhancement within the L2, L3, and L4 spinous processes. No discitis or facet edema. Conus medullaris: Extends to the L1 level and is non edematous. There is extensive epidural collection completely effacing the thecal sac throughout the lumbar spine until L4-5 and below where the collection becomes ventral and right eccentric. The infection communicates with extensive bilateral abscess within the intrinsic back muscles via the interspinous space at L3-4. Patient had recent subcutaneous collection and left buttocks collection drainage, with packing seen in place. This midline, upper subcutaneous collection communicates with the paravertebral abscess along its superior margin based on postcontrast axial images. Paraspinal and other soft tissues: As above.  Distended bladder Disc levels: Chronic bilateral pars  defects at L5. Critical Value/emergent results were called by telephone at the time of interpretation on 03/22/2018 at 3:04 pm to Dr. Thedore Mins , who verbally acknowledged these results. IMPRESSION: 1. Epidural abscess from C2 to sacrum as described. Maximal cord compression from T3-4 to T5-6. The thecal sac is completely effaced from L1 to L4-5. At the L3-4 interspinous space the spinal abscess communicates with large bilateral abscesses within the intrinsic back muscles. There is osteomyelitis of the L2, L3, and L4 spinous processes. No discitis or facet arthritis. 2. Left paravertebral abscess along the lower thoracic spine with small left empyema that is new from CT 2 days ago. 3. Distended bladder Electronically Signed   By: Marnee Spring M.D.   On: 03/22/2018 15:11   Mr Thoracic Spine W Wo Contrast  Result Date: 03/22/2018 CLINICAL DATA:  Spine infection. EXAM: MRI TOTAL SPINE WITHOUT AND WITH CONTRAST TECHNIQUE: Multisequence MR imaging of the spine from the  cervical spine to the sacrum was performed prior to and following IV contrast administration. CONTRAST:  6 cc Gadavist intravenous COMPARISON:  CT of the thoracic and lumbar spine from 2 days ago FINDINGS: MRI CERVICAL SPINE FINDINGS Alignment: Normal Vertebrae: No evidence of osseous infection. Canal/Cord: There is extensive spinal fluid collection preferentially in the ventral but also in the right more than left dorsal canal. Subarachnoid space is diffusely effaced. Maximal thickness is posterior to C2 at 9 mm. No cord signal abnormality. Posterior Fossa, vertebral arteries, paraspinal tissues: No retropharyngeal or other discrete soft tissue collection. Disc levels: C2-3: Unremarkable. C3-4: Small left foraminal protrusion with moderate narrowing C4-5: Unremarkable. C5-6: Disc narrowing and bulging with asymmetric left uncovertebral spurring. Left foraminal impingement C6-7: Right foraminal protrusion mild narrowing. C7-T1:Unremarkable. MRI THORACIC SPINE FINDINGS Alignment:  Normal Vertebrae: No evidence of osteomyelitis or discitis. T12 butterfly vertebra. Canal/Cord: Cervical ventral epidural collection continues throughout the thoracic levels. There is also a focal dorsal component at T3-4 to T5-6, where thecal sac effacement is accentuated and there is cord flattening. No cord edema. Paraspinal and other soft tissues: Paraspinous phlegmon on the left at T8-T12, with new complex left pleural effusion and lower lobe atelectasis Disc levels: T5-6 central disc protrusion. MRI LUMBAR SPINE FINDINGS Segmentation:  5 lumbar type vertebral bodies Alignment:  Grade 1 anterolisthesis at L5-S1. Vertebrae: Marrow edema and heterogeneous enhancement within the L2, L3, and L4 spinous processes. No discitis or facet edema. Conus medullaris: Extends to the L1 level and is non edematous. There is extensive epidural collection completely effacing the thecal sac throughout the lumbar spine until L4-5 and below where the  collection becomes ventral and right eccentric. The infection communicates with extensive bilateral abscess within the intrinsic back muscles via the interspinous space at L3-4. Patient had recent subcutaneous collection and left buttocks collection drainage, with packing seen in place. This midline, upper subcutaneous collection communicates with the paravertebral abscess along its superior margin based on postcontrast axial images. Paraspinal and other soft tissues: As above.  Distended bladder Disc levels: Chronic bilateral pars defects at L5. Critical Value/emergent results were called by telephone at the time of interpretation on 03/22/2018 at 3:04 pm to Dr. Thedore Mins , who verbally acknowledged these results. IMPRESSION: 1. Epidural abscess from C2 to sacrum as described. Maximal cord compression from T3-4 to T5-6. The thecal sac is completely effaced from L1 to L4-5. At the L3-4 interspinous space the spinal abscess communicates with large bilateral abscesses within the intrinsic back muscles. There is osteomyelitis of the L2, L3, and  L4 spinous processes. No discitis or facet arthritis. 2. Left paravertebral abscess along the lower thoracic spine with small left empyema that is new from CT 2 days ago. 3. Distended bladder Electronically Signed   By: Marnee Spring M.D.   On: 03/22/2018 15:11   Mr Lumbar Spine W Wo Contrast  Result Date: 03/22/2018 CLINICAL DATA:  Spine infection. EXAM: MRI TOTAL SPINE WITHOUT AND WITH CONTRAST TECHNIQUE: Multisequence MR imaging of the spine from the cervical spine to the sacrum was performed prior to and following IV contrast administration. CONTRAST:  6 cc Gadavist intravenous COMPARISON:  CT of the thoracic and lumbar spine from 2 days ago FINDINGS: MRI CERVICAL SPINE FINDINGS Alignment: Normal Vertebrae: No evidence of osseous infection. Canal/Cord: There is extensive spinal fluid collection preferentially in the ventral but also in the right more than left dorsal canal.  Subarachnoid space is diffusely effaced. Maximal thickness is posterior to C2 at 9 mm. No cord signal abnormality. Posterior Fossa, vertebral arteries, paraspinal tissues: No retropharyngeal or other discrete soft tissue collection. Disc levels: C2-3: Unremarkable. C3-4: Small left foraminal protrusion with moderate narrowing C4-5: Unremarkable. C5-6: Disc narrowing and bulging with asymmetric left uncovertebral spurring. Left foraminal impingement C6-7: Right foraminal protrusion mild narrowing. C7-T1:Unremarkable. MRI THORACIC SPINE FINDINGS Alignment:  Normal Vertebrae: No evidence of osteomyelitis or discitis. T12 butterfly vertebra. Canal/Cord: Cervical ventral epidural collection continues throughout the thoracic levels. There is also a focal dorsal component at T3-4 to T5-6, where thecal sac effacement is accentuated and there is cord flattening. No cord edema. Paraspinal and other soft tissues: Paraspinous phlegmon on the left at T8-T12, with new complex left pleural effusion and lower lobe atelectasis Disc levels: T5-6 central disc protrusion. MRI LUMBAR SPINE FINDINGS Segmentation:  5 lumbar type vertebral bodies Alignment:  Grade 1 anterolisthesis at L5-S1. Vertebrae: Marrow edema and heterogeneous enhancement within the L2, L3, and L4 spinous processes. No discitis or facet edema. Conus medullaris: Extends to the L1 level and is non edematous. There is extensive epidural collection completely effacing the thecal sac throughout the lumbar spine until L4-5 and below where the collection becomes ventral and right eccentric. The infection communicates with extensive bilateral abscess within the intrinsic back muscles via the interspinous space at L3-4. Patient had recent subcutaneous collection and left buttocks collection drainage, with packing seen in place. This midline, upper subcutaneous collection communicates with the paravertebral abscess along its superior margin based on postcontrast axial images.  Paraspinal and other soft tissues: As above.  Distended bladder Disc levels: Chronic bilateral pars defects at L5. Critical Value/emergent results were called by telephone at the time of interpretation on 03/22/2018 at 3:04 pm to Dr. Thedore Mins , who verbally acknowledged these results. IMPRESSION: 1. Epidural abscess from C2 to sacrum as described. Maximal cord compression from T3-4 to T5-6. The thecal sac is completely effaced from L1 to L4-5. At the L3-4 interspinous space the spinal abscess communicates with large bilateral abscesses within the intrinsic back muscles. There is osteomyelitis of the L2, L3, and L4 spinous processes. No discitis or facet arthritis. 2. Left paravertebral abscess along the lower thoracic spine with small left empyema that is new from CT 2 days ago. 3. Distended bladder Electronically Signed   By: Marnee Spring M.D.   On: 03/22/2018 15:11   Dg Chest Port 1 View  Result Date: 03/30/2018 CLINICAL DATA:  Shortness of breath EXAM: PORTABLE CHEST 1 VIEW COMPARISON:  03/30/2018 at 0227 hours FINDINGS: Moderate layering left pleural effusion, increased. Left  lower lobe opacity, atelectasis versus pneumonia. Right lung is clear.  No pneumothorax. The heart is normal in size. IMPRESSION: Moderate layering left pleural effusion, increased. Left lower lobe opacity, atelectasis versus pneumonia. Electronically Signed   By: Charline Bills M.D.   On: 03/30/2018 19:06   Dg Chest Port 1 View  Result Date: 03/30/2018 CLINICAL DATA:  Chest pain EXAM: PORTABLE CHEST 1 VIEW COMPARISON:  None. FINDINGS: Retrocardiac opacity, atelectasis versus pneumonia. Possible small left pleural effusion. Right lung is clear. No pneumothorax. The heart is normal in size. IMPRESSION: Retrocardiac opacity, atelectasis versus pneumonia. Possible small left pleural effusion. Electronically Signed   By: Charline Bills M.D.   On: 03/30/2018 02:49   [place] Ekg Site Rite  Result Date: 04/04/2018 If Site Rite image  not attached, placement could not be confirmed due to current cardiac rhythm.  [place] Ekg Site Rite  Result Date: 03/21/2018 If Site Rite image not attached, placement could not be confirmed due to current cardiac rhythm.    LOS: 26 days   Signature  Susa Raring M.D on 04/15/2018 at 10:13 AM  -  To page go to [email or web address] - password [name]

## 2018-04-16 LAB — URIC ACID: Uric Acid, Serum: 3 mg/dL — ABNORMAL LOW (ref 3.7–8.6)

## 2018-04-16 LAB — GLUCOSE, CAPILLARY
GLUCOSE-CAPILLARY: 169 mg/dL — AB (ref 70–99)
Glucose-Capillary: 166 mg/dL — ABNORMAL HIGH (ref 70–99)
Glucose-Capillary: 225 mg/dL — ABNORMAL HIGH (ref 70–99)
Glucose-Capillary: 184 mg/dL — ABNORMAL HIGH (ref 70–99)

## 2018-04-16 LAB — MAGNESIUM: Magnesium: 1.7 mg/dL (ref 1.7–2.4)

## 2018-04-16 LAB — NA AND K (SODIUM & POTASSIUM), RAND UR
Potassium Urine: 21 mmol/L
Sodium, Ur: 90 mmol/L

## 2018-04-16 LAB — POTASSIUM: POTASSIUM: 3.4 mmol/L — AB (ref 3.5–5.1)

## 2018-04-16 MED ORDER — TAMSULOSIN HCL 0.4 MG PO CAPS
0.4000 mg | ORAL_CAPSULE | Freq: Every day | ORAL | Status: DC
  Administered 2018-04-16: 0.4 mg via ORAL
  Filled 2018-04-16: qty 1

## 2018-04-16 MED ORDER — TAMSULOSIN HCL 0.4 MG PO CAPS: 0.4000 mg | ORAL_CAPSULE | Freq: Every day | ORAL | 0 refills | Status: DC

## 2018-04-16 MED ORDER — POTASSIUM CHLORIDE CRYS ER 20 MEQ PO TBCR
40.0000 meq | EXTENDED_RELEASE_TABLET | Freq: Once | ORAL | Status: AC
  Administered 2018-04-16: 40 meq via ORAL
  Filled 2018-04-16: qty 2

## 2018-04-16 MED ORDER — CEPHALEXIN 500 MG PO CAPS
500.0000 mg | ORAL_CAPSULE | Freq: Two times a day (BID) | ORAL | Status: DC
  Administered 2018-04-17: 500 mg via ORAL
  Filled 2018-04-16: qty 1

## 2018-04-16 MED ORDER — MAGNESIUM SULFATE IN D5W 1-5 GM/100ML-% IV SOLN
1.0000 g | Freq: Once | INTRAVENOUS | Status: AC
  Administered 2018-04-16: 1 g via INTRAVENOUS
  Filled 2018-04-16: qty 100

## 2018-04-16 MED ORDER — CEPHALEXIN 500 MG PO CAPS: 500.0000 mg | ORAL_CAPSULE | Freq: Two times a day (BID) | ORAL | 0 refills | Status: DC

## 2018-04-16 MED ORDER — CEPHALEXIN 500 MG PO CAPS: 500.0000 mg | ORAL_CAPSULE | Freq: Four times a day (QID) | ORAL | 0 refills | Status: DC

## 2018-04-16 MED ORDER — POTASSIUM CHLORIDE ER 20 MEQ PO TBCR: 20.0000 meq | EXTENDED_RELEASE_TABLET | Freq: Every day | ORAL | 0 refills | Status: DC

## 2018-04-16 MED ORDER — SODIUM CHLORIDE 0.9 % IV SOLN
2.0000 g | INTRAVENOUS | Status: AC
  Administered 2018-04-16 (×3): 2 g via INTRAVENOUS
  Filled 2018-04-16 (×3): qty 2000

## 2018-04-16 MED FILL — CEPHALEXIN 500 MG CAPSULE: 500 | 30 days supply | Qty: 60 | Fill #0

## 2018-04-16 MED FILL — POTASSIUM CL ER 20 MEQ TABL: 20 | 30 days supply | Qty: 30 | Fill #0

## 2018-04-16 MED FILL — TAMSULOSIN HCL 0.4 MG CAP: 0.4 | 30 days supply | Qty: 30 | Fill #0

## 2018-04-16 NOTE — Progress Notes (Signed)
PROGRESS NOTE        PATIENT DETAILS  Name: Tony Long  Age: 42 y.o.  Sex: male Date of Birth: Dec 29, 1975 Admit Date: 03/20/2018 Admitting Physician Kendell Bane, MD  ZOX:[id number], Rolm Gala, FNP   Brief Narrative:  [name] a 42 y.o. male history of IVDA, DM-2, hypertension admitted for worsening back pain-further evaluation revealed MSSA bacteremia with tricuspid valve endocarditis, epidural abscess involving C2 through sacrum, and numerous soft tissue back abscesses.  Evaluated by general surgery-underwent I&D of her lower back soft tissue abscess, evaluated by neurosurgery-not felt to be a candidate for decompressive surgery due to extensive nature of the disease and lack of any significant neurological findings.  ID following He was initially kept on IV cefazolin and then antibiotic coverage was broadened to Vancomycin and Rocephin on 03/30/2018 by ID.   Currently on IV nafcillin on 04/16/2018, thereafter 4 weeks of Keflex 500 p.o. twice daily per ID.Marland Kitchen   See below for further details   Subjective:  Patient in bed, appears comfortable, denies any headache, no fever, no chest pain or pressure, no shortness of breath , no abdominal pain. No focal weakness.  Assessment/Plan:   MSSA Bacteremia with tricuspid valve endocarditis, extensive epidural abscess (C2 through sacrum) and large soft tissue lower back abscess  : due to extensive nature of the epidural abscess - neurosurgery does not recommend decompressive surgery. Seen by ID and current recommendation noted, he was initially kept on IV cefazolin and then antibiotic coverage was broadened to Vancomycin and Rocephin on 03/30/2018 by ID. Currently on IV nafcillin until 04/16/2018, thereafter 4 weeks of Keflex 500 p.o. twice daily per ID.. D/w Dr Jolayne Haines on 04/09/18, he confirms antibiotic plan above. Given history of IVDA-not a candidate for outpatient IV Abx therapy. He has received a PICC line, continue  IV antibiotics through PICC Line.  Patient doing fairly well.  Cleared to be discharged on 04/17/2018 on oral Keflex.    Cocaine/Substance abuse: Withdrawal symptoms have resolved, continue  Suboxone, he does not want short-acting pain medications anymore.  Polysubstance abuse/IVDA: Counseled to quit.  Anemia.  Normocytic, he had some chronic anemia upon admission as well and now worse due to him dilution from IV fluids also some element of iron deficiency per anemia panel likely due to multiple blood draws.  No signs of ongoing bleeding or acute blood loss, s/p  1 unit of packed RBC on 04/06/2018 and monitor, hgb is stable, will check CBCs on Tuesdays Thursdays and Saturdays.  Mild lower extremity leg edema. improving with TED stockings.  New onset of left-sided pleuritic chest pain starting on late night 03/29/2018.  Nonspecific EKG changes stable on combination of aspirin, beta-blocker and statin.  Echocardiogram shows no wall motion abnormality and preserved EF, patient remains chest pain-free.  Hypertension: Monitor on beta-blocker.  Acute urinary retention: Continue Foley and Flomax, trial of Foley removal failed on 04/03/2018, likely combination of lack of activity and spine infection, failed voiding trial again on 04/09/2018, Foley reinserted and 1450 mL of urine removed, ,will need outpatient follow-up with urology discharge.  Recurrent hypokalemia  .  Replaced again, also placed on scheduled Aldactone, case discussed with nephrologist Dr. Darrick Penna on 04/16/2018, could be Fanconi syndrome, will recheck magnesium along with uric acid level and phosphorus, if all stable then placed on low-dose potassium supplementation with outpatient urology follow-up.  DM-2: Poor outpatient control, last A1c 12.6, continue Lantus 25 units nightly and sliding scale.   CBG (last 3)  Recent Labs    04/15/18 1705 04/15/18 2107 04/16/18 0827  GLUCAP 193* 239* 166*   Lab Results  Component Value Date    HGBA1C 12.6 (A) 02/26/2018     DVT Prophylaxis: Prophylactic heparin  Code Status: Full code  Disposition Plan: Home after completing IV antibiotics ...04/16/18 or 04/17/18  Antimicrobial agents: Anti-infectives (From admission, onward)   Start     Dose/Rate Route Frequency Ordered Stop   04/01/18 1300  nafcillin 2 g in sodium chloride 0.9 % 100 mL IVPB     2 g 200 mL/hr over 30 Minutes Intravenous Every 4 hours 04/01/18 1242     04/01/18 1200  nafcillin injection 2 g  Status:  Discontinued     2 g Intravenous Every 4 hours 04/01/18 1129 04/01/18 1242   03/31/18 0200  vancomycin (VANCOCIN) IVPB 750 mg/150 ml premix  Status:  Discontinued     750 mg 150 mL/hr over 60 Minutes Intravenous Every 8 hours 03/30/18 1628 04/01/18 1129   03/31/18 0100  meropenem (MERREM) 1 g in sodium chloride 0.9 % 100 mL IVPB  Status:  Discontinued     1 g 200 mL/hr over 30 Minutes Intravenous Every 8 hours 03/30/18 1628 04/01/18 1129   03/30/18 1630  vancomycin (VANCOCIN) 1,500 mg in sodium chloride 0.9 % 500 mL IVPB     1,500 mg 250 mL/hr over 120 Minutes Intravenous NOW 03/30/18 1619 03/30/18 1925   03/30/18 1630  meropenem (MERREM) 2 g in sodium chloride 0.9 % 100 mL IVPB     2 g 200 mL/hr over 30 Minutes Intravenous NOW 03/30/18 1619 03/30/18 1754   03/21/18 0930  ceFAZolin (ANCEF) IVPB 2g/100 mL premix  Status:  Discontinued     2 g 200 mL/hr over 30 Minutes Intravenous Every 8 hours 03/21/18 0920 03/30/18 1616      Procedures:   MRI C, T and L-spine done 03/30/2018 and 03/31/2018.    CT angiogram chest 03/30/2018.  1. No pulmonary embolus. 2. Moderate loculated left pleural effusion. Adjacent airspace disease likely compressive atelectasis, pneumonia not excluded in the setting of spinal infection and endocarditis. Mild right pleural thickening or loculated pleural fluid in the upper right hemithorax.  Repeat echocardiogram 03/30/2018 - Left ventricle: The cavity size was normal. Wall  thickness was normal. Systolic function was normal. The estimated ejection fraction was in the range of 60% to 65%. Wall motion was normal; there were no regional wall motion abnormalities. Left ventricular diastolic function parameters were normal.  TEE - - Left ventricle: The cavity size was normal. Wall thickness wasnormal. Systolic function was normal. The estimated ejectionfraction was in the range of 60% to 65%. - Aortic valve: No evidence of vegetation. - Mitral valve: No evidence of vegetation. - Left atrium: No evidence of thrombus in the atrial cavity or appendage. No evidence of thrombus in the appendage. - Right atrium: No evidence of thrombus in the atrial cavity orappendage. - Tricuspid valve: There was a vegetation. There was a small (1.2cmx .6cm), mobile vegetation on the septal leaflet. There was mildregurgitation. - Pulmonic valve: No evidence of vegetation.  Impressions:   Tricuspid valve endocarditis. Discussed with primary team   MRI C-T-L Spine - 1. Epidural abscess from C2 to sacrum as described. Maximal cord compression from T3-4 to T5-6. The thecal sac is completely effaced from L1 to L4-5. At the L3-4  interspinous space the spinal abscess communicates with large bilateral abscesses within the intrinsic back muscles. There is osteomyelitis of the L2, L3, and L4 spinous processes. No discitis or facet arthritis. 2. Left paravertebral abscess along the lower thoracic spine with small left empyema that is new from CT 2 days ago. 3. Distended bladder  CT - 1. Evidence of cellulitis involving the right side of the base of the neck extending into the right supraclavicular region with fluid and gas in the soft tissues at the base of the right side of the neck. 2. No significant abnormality of the thoracic spine. 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  CT -  1. Extensive abnormal fluid collection in the subcutaneous fat of the midline of the back with underlying marked  abnormality of the posterior paraspinal musculature from L1-2 through S3. This is worrisome for subcutaneous abscess and myositis. 2. Moth-eaten appearance of the spinous processes of L3 and L4 consistent with osteomyelitis. 3. No discrete epidural abscess. However, the abnormal edema in the paraspinal musculature extends to the posterior aspect of the spinal canal at L3-4. 4. MRI with and without contrast may better define the extent of the soft tissue and infection and could detect epidural extension that is not apparent on this unenhanced CT scan.  9/22>> PICC line  I&D by [facility] of back soft tissue abscess on -  03/22/18  CONSULTS:  N.Surgery, ID, general surgery, PCCM    MEDICATIONS:  Scheduled Meds:  . aspirin  81 mg Oral Daily  . atorvastatin  40 mg Oral q1800  . buprenorphine-naloxone  2 tablet Sublingual Daily  . feeding supplement (PRO-STAT SUGAR FREE 64)  30 mL Oral TID WC  . gabapentin  300 mg Oral TID  . heparin injection (subcutaneous)  5,000 Units Subcutaneous Q8H  . insulin aspart  0-5 Units Subcutaneous QHS  . insulin aspart  0-9 Units Subcutaneous TID WC  . insulin aspart  4 Units Subcutaneous TID WC  . insulin glargine  25 Units Subcutaneous QHS  . metoprolol tartrate  50 mg Oral BID  . nicotine  21 mg Transdermal Daily  . polyethylene glycol  17 g Oral BID  . senna-docusate  2 tablet Oral QHS  . spironolactone  25 mg Oral Daily  . tamsulosin  0.4 mg Oral QPC supper   Continuous Infusions: . magnesium sulfate 1 - 4 g bolus IVPB    . nafcillin IV 2 g (04/16/18 0841)   PRN Meds:.acetaminophen, bisacodyl, clonazepam, cyclobenzaprine, hydrALAZINE, LORazepam, magnesium hydroxide, metoprolol tartrate, nitroGLYCERIN, sodium chloride flush, sodium phosphate   PHYSICAL EXAM: Vital signs: Vitals:   04/15/18 1005 04/15/18 1543 04/15/18 2110 04/16/18 0528  BP: (!) 143/91 110/65 123/78 129/78  Pulse: 93 79 83 76  Resp:  20 16 18   Temp:  98.5 F (36.9 C) 98.6 F (37  C) 98.4 F (36.9 C)  TempSrc:  Oral Oral   SpO2:  95% 96% 96%  Weight:      Height:       Filed Weights   03/21/18 0602  Weight: 68 kg   Body mass index is 20.92 kg/m.      Physical Exam  Awake Alert, Oriented X 3, No new F.N deficits, Normal affect [place].AT,PERRAL Supple Neck,No JVD, No cervical lymphadenopathy appriciated.  Symmetrical Chest wall movement, Good air movement bilaterally, CTAB RRR,No Gallops, Rubs or new Murmurs, No Parasternal Heave +ve B.Sounds, Abd Soft, No tenderness, No organomegaly appriciated, No rebound - guarding or rigidity. No Cyanosis, Clubbing or edema,  No new Rash or bruise. foley in place   LABORATORY DATA: CBC: Recent Labs  Lab 04/10/18 0812 04/13/18 0814 04/15/18 0800  WBC 8.7 8.5 7.6  HGB 7.8* 8.5* 8.0*  HCT 26.7* 27.7* 26.5*  MCV 99.3 95.2 95.3  PLT 384 486* 400    Basic Metabolic Panel: Recent Labs  Lab 04/10/18 0812 04/13/18 0814 04/15/18 0359 04/15/18 0800 04/16/18 0329  NA 137 139  --  138  --   K 3.6 3.4*  --  3.4* 3.4*  CL 99 103  --  102  --   CO2 30 28  --  29  --   GLUCOSE 245* 145*  --  146*  --   BUN 5* 6  --  <5*  --   CREATININE 0.72 0.65  --  0.63  --   CALCIUM 7.9* 8.3*  --  8.1*  --   MG  --   --  1.8  --   --     GFR: Estimated Creatinine Clearance: 116.9 mL/min (by C-G formula based on SCr of 0.63 mg/dL).  Liver Function Tests: No results for input(s): AST, ALT, ALKPHOS, BILITOT, PROT, ALBUMIN in the last 168 hours. No results for input(s): LIPASE, AMYLASE in the last 168 hours. No results for input(s): AMMONIA in the last 168 hours.  Coagulation Profile: No results for input(s): INR, PROTIME in the last 168 hours.  Cardiac Enzymes: No results for input(s): CKTOTAL, CKMB, CKMBINDEX, TROPONINI in the last 168 hours.  BNP (last 3 results) No results for input(s): PROBNP in the last 8760 hours.  HbA1C: No results for input(s): HGBA1C in the last 72 hours.  CBG: Recent Labs  Lab  04/15/18 0822 04/15/18 1214 04/15/18 1705 04/15/18 2107 04/16/18 0827  GLUCAP 127* 133* 193* 239* 166*    Lipid Profile: No results for input(s): CHOL, HDL, LDLCALC, TRIG, CHOLHDL, LDLDIRECT in the last 72 hours.  Thyroid Function Tests: No results for input(s): TSH, T4TOTAL, FREET4, T3FREE, THYROIDAB in the last 72 hours.  Anemia Panel: No results for input(s): VITAMINB12, FOLATE, FERRITIN, TIBC, IRON, RETICCTPCT in the last 72 hours.  Urine analysis:    Component Value Date/Time   COLORURINE YELLOW 03/30/2018 1930   APPEARANCEUR CLEAR 03/30/2018 1930   LABSPEC 1.009 03/30/2018 1930   PHURINE 6.0 03/30/2018 1930   GLUCOSEU 50 (A) 03/30/2018 1930   HGBUR SMALL (A) 03/30/2018 1930   BILIRUBINUR NEGATIVE 03/30/2018 1930   BILIRUBINUR small 02/26/2018 1202   KETONESUR NEGATIVE 03/30/2018 1930   PROTEINUR NEGATIVE 03/30/2018 1930   UROBILINOGEN 1.0 02/26/2018 1202   UROBILINOGEN 1.0 12/11/2016 1020   NITRITE NEGATIVE 03/30/2018 1930   LEUKOCYTESUR TRACE (A) 03/30/2018 1930    Sepsis Labs: Lactic Acid, Venous    Component Value Date/Time   LATICACIDVEN 1.2 03/30/2018 1837    MICROBIOLOGY: No results found for this or any previous visit (from the past 240 hour(s)).  RADIOLOGY STUDIES/RESULTS:  Ct Angio Chest Pe W Or Wo Contrast  Result Date: 03/30/2018 CLINICAL DATA:  Shortness of breath PE suspected, high pretest prob. Patient with known epidural abscess and endocarditis. Now with left-sided chest pain. EXAM: CT ANGIOGRAPHY CHEST WITH CONTRAST TECHNIQUE: Multidetector CT imaging of the chest was performed using the standard protocol during bolus administration of intravenous contrast. Multiplanar CT image reconstructions and MIPs were obtained to evaluate the vascular anatomy. CONTRAST:  54mL ISOVUE-370 IOPAMIDOL (ISOVUE-370) INJECTION 76% COMPARISON:  Chest radiographs earlier this day. FINDINGS: Cardiovascular: There are no filling defects within the pulmonary  arteries  to suggest pulmonary embolus. The thoracic aorta is normal in caliber without dissection. Mild cardiomegaly. Small amount pericardial fluid. Mediastinum/Nodes: Loculated left pleural effusion tracks along the left aspect of the mediastinum. Small left hilar nodes without bulky adenopathy. No enlarged mediastinal nodes. No dominant thyroid nodule. The esophagus is decompressed. Lungs/Pleura: Moderate left pleural effusion is loculated and tracks along the posterior, lateral, and medial hemithorax. Adjacent airspace disease in the left lower lobe, favoring compressive atelectasis, with adjacent compressive atelectasis in the right upper lobe. Pleural thickening versus loculated pleural effusion the upper medial right hemithorax, with additional areas of pleural thickening dependently. Dependent atelectasis in the right lower lobe. Trachea and bronchi are patent. Upper Abdomen: No acute findings. Musculoskeletal: Patient with known epidural abscess, not well delineated by CT. No bony destructive change. Review of the MIP images confirms the above findings. IMPRESSION: 1. No pulmonary embolus. 2. Moderate loculated left pleural effusion. Adjacent airspace disease likely compressive atelectasis, pneumonia not excluded in the setting of spinal infection and endocarditis. Mild right pleural thickening or loculated pleural fluid in the upper right hemithorax. Electronically Signed   By: Narda Rutherford M.D.   On: 03/30/2018 23:21   Ct Thoracic Spine Wo Contrast  Result Date: 03/20/2018 CLINICAL DATA:  Progressive back pain.  Swelling of the lower back. EXAM: CT THORACIC SPINE WITHOUT CONTRAST TECHNIQUE: Multidetector CT images of the thoracic were obtained using the standard protocol without intravenous contrast. COMPARISON:  None. FINDINGS: Alignment: Normal. 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 Paraspinal and other soft tissues: There is abnormal gas and fluid in the soft tissues of the right side of the  base of the neck and in the right supraclavicular region. Paraspinal soft tissues appear normal throughout the thoracic spine. Disc levels: There is no evidence of disc protrusion or significant disc bulging or spinal or foraminal stenosis or other significant abnormality of the thoracic spine. IMPRESSION: 1. Evidence of cellulitis involving the right side of the base of the neck extending into the right supraclavicular region with fluid and gas in the soft tissues at the base of the right side of the neck. 2. No significant abnormality of the thoracic spine. 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 Electronically Signed   By: Francene Boyers M.D.   On: 03/20/2018 11:25   Ct Lumbar Spine Wo Contrast  Addendum Date: 03/20/2018   ADDENDUM REPORT: 03/20/2018 11:44 ADDENDUM: Critical Value/emergent results were called by telephone at the time of interpretation on 03/20/2018 at 11:30 am to Dr. Sharyn Creamer , who verbally acknowledged these results. Electronically Signed   By: Francene Boyers M.D.   On: 03/20/2018 11:44   Result Date: 03/20/2018 CLINICAL DATA:  Increasing low back pain and soft tissue swelling. EXAM: CT LUMBAR SPINE WITHOUT CONTRAST TECHNIQUE: Multidetector CT imaging of the lumbar spine was performed without intravenous contrast administration. Multiplanar CT image reconstructions were also generated. IV contrast could not be utilized due to the lack of an appropriate IV. COMPARISON:  None. FINDINGS: Segmentation: 5 lumbar type vertebrae. Alignment: Normal. Vertebrae: There is a moth-eaten appearance of the spinous processes of L3 and L4 which is worrisome for osteomyelitis. Bilateral pars defects at L5 with grade 1 spondylolisthesis. 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 Paraspinal and other soft tissues: There is an extensive abnormal fluid collection in the subcutaneous soft tissues of the posterior aspect of the back extending from approximately L1-2 to S3. This fluid collection is lobulated and  measures approximately 20 x 9 x 2.5 cm. It is  centered slightly to the left of midline and has a mass effect upon the adjacent posterior paraspinal muscles. There is abnormal lucency in the underlying paraspinal muscles which could represent myositis. Disc levels: T11-12: No significant abnormality. Butterfly T12 vertebra. T12-L1: No significant abnormality. L1-2: Normal disc. Abnormal edema in the posterior paraspinal musculature with adjacent fluid collection in the subcutaneous fat of the posterior aspect of the back as described above. L2-3: Normal disc. L3-4: Normal disc. Lucency in the posterior paraspinal soft tissues extends to the posterior aspect of the thecal sac on image 80 of series 4 but there is no discrete epidural abscess. L4-5: Normal disc.  No evidence of epidural abscess. L5-S1: Grade 1 spondylolisthesis. No disc bulging or protrusion. Bilateral pars defects. No visible epidural abscess. IMPRESSION: 1. Extensive abnormal fluid collection in the subcutaneous fat of the midline of the back with underlying marked abnormality of the posterior paraspinal musculature from L1-2 through S3. This is worrisome for subcutaneous abscess and myositis. 2. Moth-eaten appearance of the spinous processes of L3 and L4 consistent with osteomyelitis. 3. No discrete epidural abscess. However, the abnormal edema in the paraspinal musculature extends to the posterior aspect of the spinal canal at L3-4. 4. MRI with and without contrast may better define the extent of the soft tissue and infection and could detect epidural extension that is not apparent on this unenhanced CT scan. Electronically Signed: By: Francene Boyers M.D. On: 03/20/2018 11:18   Mr Cervical Spine Wo Contrast  Result Date: 03/30/2018 CLINICAL DATA:  42 year old male with history of MSSA bacteremia with known endocarditis and epidural abscess extending from C2 through the sacrum. Recent episode of hypotension and acute lower extremity weakness.  Evaluate abscess and possible spinal cord infarct. EXAM: MRI CERVICAL, THORACIC SPINE WITHOUT CONTRAST TECHNIQUE: Multiplanar and multiecho pulse sequences of the cervical spine, to include the craniocervical junction and cervicothoracic junction, and thoracic and lumbar spine, were obtained without intravenous contrast. COMPARISON:  Prior MRI from 03/22/2018. FINDINGS: MRI CERVICAL SPINE FINDINGS Alignment: Examination technically limited as the patient was unable to tolerate the full length of the exam. Additionally, images provided are degraded by motion artifact. Reversal of the normal cervical lordosis with apex at C5, stable. No interval listhesis or malalignment. Vertebrae: Vertebral body height maintained without acute or interval fracture. Bone marrow signal intensity diffusely decreased on T1 weighted imaging, suspected to be related to anemia and chronic disease. No discrete osseous lesions. No abnormal marrow edema. No evidence for interval discitis. Cord: Signal intensity within the cervical spinal cord is within normal limits. No findings to suggest interval cord infarction on this motion degraded and limited exam. Previously identified epidural collection involving the ventral epidural space extending from C2 inferiorly is decreased in size, now measuring up to 7 mm in maximal AP diameter at the level of C2. Collection diffusely involves the ventral epidural space, but also is seen dorsally as well. Slightly improved diffuse spinal stenosis with thecal sac patency. Posterior Fossa, vertebral arteries, paraspinal tissues: Visualized brain and posterior fossa within normal limits. Craniocervical junction normal. Scattered edema within the posterior paraspinous soft tissues. Normal intravascular flow voids seen within the vertebral arteries bilaterally. Disc levels: C2-C3: Unremarkable. C3-C4: Small left foraminal protrusion with associated moderate left C4 foraminal stenosis. C4-C5:  Unremarkable.  C5-C6: Left eccentric disc bulge with uncovertebral hypertrophy. Moderate left C6 foraminal narrowing. C6-C7: Right foraminal disc protrusion with associated moderate right C7 foraminal stenosis. C7-T1:  Unremarkable. MRI THORACIC SPINE FINDINGS Alignment: Examination markedly limited as  the patient was unable to tolerate the full length of the exam. Sagittal T1, T2, and STIR sequences only were performed. No axial images obtained. Vertebral bodies normally aligned with preservation of the normal thoracic kyphosis. Vertebrae: Vertebral body height maintained without evidence for interval fracture. Butterfly vertebra noted at T12. Diffusely decreased T1 weighted signal intensity throughout the visualized bone marrow, like related to anemia chronic disease. No findings to suggest interval or new discitis or septic arthritis. Cord: Signal intensity within the thoracic spinal cord grossly within normal limits on these limited sagittal views. No definite cord signal abnormality or cord edema to suggest acute spinal cord infarction. Previously seen diffuse epidural collection appears overall decreased in size from previous, with improved spinal stenosis and thecal sac patency. Paraspinal and other soft tissues: Scattered edema seen within the posterior paraspinous soft tissues at the upper back/cervicothoracic junction. No discrete soft tissue collections. Disc levels: T5-6: Central disc protrusion indenting upon the ventral thoracic spinal cord, stable. IMPRESSION: 1. Technically limited exam due to motion artifact and the patient's inability to tolerate the full length of the exam. Cervical and thoracic spine only was imaged, and only sagittal sequences of the thoracic spine were obtained. 2. No imaging findings to suggest spinal cord infarction identified on this limited exam. 3. Interval improvement in diffuse epidural collection, decreased in size as compared to previous exam with improved diffuse spinal stenosis and  thecal sac patency. No new discitis or facet arthritis. Electronically Signed   By: Rise Mu M.D.   On: 03/30/2018 22:52   Mr Thoracic Spine Wo Contrast  Result Date: 03/30/2018 CLINICAL DATA:  42 year old male with history of MSSA bacteremia with known endocarditis and epidural abscess extending from C2 through the sacrum. Recent episode of hypotension and acute lower extremity weakness. Evaluate abscess and possible spinal cord infarct. EXAM: MRI CERVICAL, THORACIC SPINE WITHOUT CONTRAST TECHNIQUE: Multiplanar and multiecho pulse sequences of the cervical spine, to include the craniocervical junction and cervicothoracic junction, and thoracic and lumbar spine, were obtained without intravenous contrast. COMPARISON:  Prior MRI from 03/22/2018. FINDINGS: MRI CERVICAL SPINE FINDINGS Alignment: Examination technically limited as the patient was unable to tolerate the full length of the exam. Additionally, images provided are degraded by motion artifact. Reversal of the normal cervical lordosis with apex at C5, stable. No interval listhesis or malalignment. Vertebrae: Vertebral body height maintained without acute or interval fracture. Bone marrow signal intensity diffusely decreased on T1 weighted imaging, suspected to be related to anemia and chronic disease. No discrete osseous lesions. No abnormal marrow edema. No evidence for interval discitis. Cord: Signal intensity within the cervical spinal cord is within normal limits. No findings to suggest interval cord infarction on this motion degraded and limited exam. Previously identified epidural collection involving the ventral epidural space extending from C2 inferiorly is decreased in size, now measuring up to 7 mm in maximal AP diameter at the level of C2. Collection diffusely involves the ventral epidural space, but also is seen dorsally as well. Slightly improved diffuse spinal stenosis with thecal sac patency. Posterior Fossa, vertebral arteries,  paraspinal tissues: Visualized brain and posterior fossa within normal limits. Craniocervical junction normal. Scattered edema within the posterior paraspinous soft tissues. Normal intravascular flow voids seen within the vertebral arteries bilaterally. Disc levels: C2-C3: Unremarkable. C3-C4: Small left foraminal protrusion with associated moderate left C4 foraminal stenosis. C4-C5:  Unremarkable. C5-C6: Left eccentric disc bulge with uncovertebral hypertrophy. Moderate left C6 foraminal narrowing. C6-C7: Right foraminal disc protrusion with associated moderate  right C7 foraminal stenosis. C7-T1:  Unremarkable. MRI THORACIC SPINE FINDINGS Alignment: Examination markedly limited as the patient was unable to tolerate the full length of the exam. Sagittal T1, T2, and STIR sequences only were performed. No axial images obtained. Vertebral bodies normally aligned with preservation of the normal thoracic kyphosis. Vertebrae: Vertebral body height maintained without evidence for interval fracture. Butterfly vertebra noted at T12. Diffusely decreased T1 weighted signal intensity throughout the visualized bone marrow, like related to anemia chronic disease. No findings to suggest interval or new discitis or septic arthritis. Cord: Signal intensity within the thoracic spinal cord grossly within normal limits on these limited sagittal views. No definite cord signal abnormality or cord edema to suggest acute spinal cord infarction. Previously seen diffuse epidural collection appears overall decreased in size from previous, with improved spinal stenosis and thecal sac patency. Paraspinal and other soft tissues: Scattered edema seen within the posterior paraspinous soft tissues at the upper back/cervicothoracic junction. No discrete soft tissue collections. Disc levels: T5-6: Central disc protrusion indenting upon the ventral thoracic spinal cord, stable. IMPRESSION: 1. Technically limited exam due to motion artifact and the  patient's inability to tolerate the full length of the exam. Cervical and thoracic spine only was imaged, and only sagittal sequences of the thoracic spine were obtained. 2. No imaging findings to suggest spinal cord infarction identified on this limited exam. 3. Interval improvement in diffuse epidural collection, decreased in size as compared to previous exam with improved diffuse spinal stenosis and thecal sac patency. No new discitis or facet arthritis. Electronically Signed   By: Rise Mu M.D.   On: 03/30/2018 22:52   Mr Cervical Spine W Wo Contrast  Result Date: 03/22/2018 CLINICAL DATA:  Spine infection. EXAM: MRI TOTAL SPINE WITHOUT AND WITH CONTRAST TECHNIQUE: Multisequence MR imaging of the spine from the cervical spine to the sacrum was performed prior to and following IV contrast administration. CONTRAST:  6 cc Gadavist intravenous COMPARISON:  CT of the thoracic and lumbar spine from 2 days ago FINDINGS: MRI CERVICAL SPINE FINDINGS Alignment: Normal Vertebrae: No evidence of osseous infection. Canal/Cord: There is extensive spinal fluid collection preferentially in the ventral but also in the right more than left dorsal canal. Subarachnoid space is diffusely effaced. Maximal thickness is posterior to C2 at 9 mm. No cord signal abnormality. Posterior Fossa, vertebral arteries, paraspinal tissues: No retropharyngeal or other discrete soft tissue collection. Disc levels: C2-3: Unremarkable. C3-4: Small left foraminal protrusion with moderate narrowing C4-5: Unremarkable. C5-6: Disc narrowing and bulging with asymmetric left uncovertebral spurring. Left foraminal impingement C6-7: Right foraminal protrusion mild narrowing. C7-T1:Unremarkable. MRI THORACIC SPINE FINDINGS Alignment:  Normal Vertebrae: No evidence of osteomyelitis or discitis. T12 butterfly vertebra. Canal/Cord: Cervical ventral epidural collection continues throughout the thoracic levels. There is also a focal dorsal component  at T3-4 to T5-6, where thecal sac effacement is accentuated and there is cord flattening. No cord edema. Paraspinal and other soft tissues: Paraspinous phlegmon on the left at T8-T12, with new complex left pleural effusion and lower lobe atelectasis Disc levels: T5-6 central disc protrusion. MRI LUMBAR SPINE FINDINGS Segmentation:  5 lumbar type vertebral bodies Alignment:  Grade 1 anterolisthesis at L5-S1. Vertebrae: Marrow edema and heterogeneous enhancement within the L2, L3, and L4 spinous processes. No discitis or facet edema. Conus medullaris: Extends to the L1 level and is non edematous. There is extensive epidural collection completely effacing the thecal sac throughout the lumbar spine until L4-5 and below where the collection becomes ventral  and right eccentric. The infection communicates with extensive bilateral abscess within the intrinsic back muscles via the interspinous space at L3-4. Patient had recent subcutaneous collection and left buttocks collection drainage, with packing seen in place. This midline, upper subcutaneous collection communicates with the paravertebral abscess along its superior margin based on postcontrast axial images. Paraspinal and other soft tissues: As above.  Distended bladder Disc levels: Chronic bilateral pars defects at L5. Critical Value/emergent results were called by telephone at the time of interpretation on 03/22/2018 at 3:04 pm to Dr. Thedore Mins , who verbally acknowledged these results. IMPRESSION: 1. Epidural abscess from C2 to sacrum as described. Maximal cord compression from T3-4 to T5-6. The thecal sac is completely effaced from L1 to L4-5. At the L3-4 interspinous space the spinal abscess communicates with large bilateral abscesses within the intrinsic back muscles. There is osteomyelitis of the L2, L3, and L4 spinous processes. No discitis or facet arthritis. 2. Left paravertebral abscess along the lower thoracic spine with small left empyema that is new from CT 2  days ago. 3. Distended bladder Electronically Signed   By: Marnee Spring M.D.   On: 03/22/2018 15:11   Mr Thoracic Spine W Wo Contrast  Result Date: 03/22/2018 CLINICAL DATA:  Spine infection. EXAM: MRI TOTAL SPINE WITHOUT AND WITH CONTRAST TECHNIQUE: Multisequence MR imaging of the spine from the cervical spine to the sacrum was performed prior to and following IV contrast administration. CONTRAST:  6 cc Gadavist intravenous COMPARISON:  CT of the thoracic and lumbar spine from 2 days ago FINDINGS: MRI CERVICAL SPINE FINDINGS Alignment: Normal Vertebrae: No evidence of osseous infection. Canal/Cord: There is extensive spinal fluid collection preferentially in the ventral but also in the right more than left dorsal canal. Subarachnoid space is diffusely effaced. Maximal thickness is posterior to C2 at 9 mm. No cord signal abnormality. Posterior Fossa, vertebral arteries, paraspinal tissues: No retropharyngeal or other discrete soft tissue collection. Disc levels: C2-3: Unremarkable. C3-4: Small left foraminal protrusion with moderate narrowing C4-5: Unremarkable. C5-6: Disc narrowing and bulging with asymmetric left uncovertebral spurring. Left foraminal impingement C6-7: Right foraminal protrusion mild narrowing. C7-T1:Unremarkable. MRI THORACIC SPINE FINDINGS Alignment:  Normal Vertebrae: No evidence of osteomyelitis or discitis. T12 butterfly vertebra. Canal/Cord: Cervical ventral epidural collection continues throughout the thoracic levels. There is also a focal dorsal component at T3-4 to T5-6, where thecal sac effacement is accentuated and there is cord flattening. No cord edema. Paraspinal and other soft tissues: Paraspinous phlegmon on the left at T8-T12, with new complex left pleural effusion and lower lobe atelectasis Disc levels: T5-6 central disc protrusion. MRI LUMBAR SPINE FINDINGS Segmentation:  5 lumbar type vertebral bodies Alignment:  Grade 1 anterolisthesis at L5-S1. Vertebrae: Marrow edema  and heterogeneous enhancement within the L2, L3, and L4 spinous processes. No discitis or facet edema. Conus medullaris: Extends to the L1 level and is non edematous. There is extensive epidural collection completely effacing the thecal sac throughout the lumbar spine until L4-5 and below where the collection becomes ventral and right eccentric. The infection communicates with extensive bilateral abscess within the intrinsic back muscles via the interspinous space at L3-4. Patient had recent subcutaneous collection and left buttocks collection drainage, with packing seen in place. This midline, upper subcutaneous collection communicates with the paravertebral abscess along its superior margin based on postcontrast axial images. Paraspinal and other soft tissues: As above.  Distended bladder Disc levels: Chronic bilateral pars defects at L5. Critical Value/emergent results were called by telephone at the  time of interpretation on 03/22/2018 at 3:04 pm to Dr. Thedore Mins , who verbally acknowledged these results. IMPRESSION: 1. Epidural abscess from C2 to sacrum as described. Maximal cord compression from T3-4 to T5-6. The thecal sac is completely effaced from L1 to L4-5. At the L3-4 interspinous space the spinal abscess communicates with large bilateral abscesses within the intrinsic back muscles. There is osteomyelitis of the L2, L3, and L4 spinous processes. No discitis or facet arthritis. 2. Left paravertebral abscess along the lower thoracic spine with small left empyema that is new from CT 2 days ago. 3. Distended bladder Electronically Signed   By: Marnee Spring M.D.   On: 03/22/2018 15:11   Mr Lumbar Spine W Wo Contrast  Result Date: 03/22/2018 CLINICAL DATA:  Spine infection. EXAM: MRI TOTAL SPINE WITHOUT AND WITH CONTRAST TECHNIQUE: Multisequence MR imaging of the spine from the cervical spine to the sacrum was performed prior to and following IV contrast administration. CONTRAST:  6 cc Gadavist intravenous  COMPARISON:  CT of the thoracic and lumbar spine from 2 days ago FINDINGS: MRI CERVICAL SPINE FINDINGS Alignment: Normal Vertebrae: No evidence of osseous infection. Canal/Cord: There is extensive spinal fluid collection preferentially in the ventral but also in the right more than left dorsal canal. Subarachnoid space is diffusely effaced. Maximal thickness is posterior to C2 at 9 mm. No cord signal abnormality. Posterior Fossa, vertebral arteries, paraspinal tissues: No retropharyngeal or other discrete soft tissue collection. Disc levels: C2-3: Unremarkable. C3-4: Small left foraminal protrusion with moderate narrowing C4-5: Unremarkable. C5-6: Disc narrowing and bulging with asymmetric left uncovertebral spurring. Left foraminal impingement C6-7: Right foraminal protrusion mild narrowing. C7-T1:Unremarkable. MRI THORACIC SPINE FINDINGS Alignment:  Normal Vertebrae: No evidence of osteomyelitis or discitis. T12 butterfly vertebra. Canal/Cord: Cervical ventral epidural collection continues throughout the thoracic levels. There is also a focal dorsal component at T3-4 to T5-6, where thecal sac effacement is accentuated and there is cord flattening. No cord edema. Paraspinal and other soft tissues: Paraspinous phlegmon on the left at T8-T12, with new complex left pleural effusion and lower lobe atelectasis Disc levels: T5-6 central disc protrusion. MRI LUMBAR SPINE FINDINGS Segmentation:  5 lumbar type vertebral bodies Alignment:  Grade 1 anterolisthesis at L5-S1. Vertebrae: Marrow edema and heterogeneous enhancement within the L2, L3, and L4 spinous processes. No discitis or facet edema. Conus medullaris: Extends to the L1 level and is non edematous. There is extensive epidural collection completely effacing the thecal sac throughout the lumbar spine until L4-5 and below where the collection becomes ventral and right eccentric. The infection communicates with extensive bilateral abscess within the intrinsic back  muscles via the interspinous space at L3-4. Patient had recent subcutaneous collection and left buttocks collection drainage, with packing seen in place. This midline, upper subcutaneous collection communicates with the paravertebral abscess along its superior margin based on postcontrast axial images. Paraspinal and other soft tissues: As above.  Distended bladder Disc levels: Chronic bilateral pars defects at L5. Critical Value/emergent results were called by telephone at the time of interpretation on 03/22/2018 at 3:04 pm to Dr. Thedore Mins , who verbally acknowledged these results. IMPRESSION: 1. Epidural abscess from C2 to sacrum as described. Maximal cord compression from T3-4 to T5-6. The thecal sac is completely effaced from L1 to L4-5. At the L3-4 interspinous space the spinal abscess communicates with large bilateral abscesses within the intrinsic back muscles. There is osteomyelitis of the L2, L3, and L4 spinous processes. No discitis or facet arthritis. 2. Left paravertebral abscess  along the lower thoracic spine with small left empyema that is new from CT 2 days ago. 3. Distended bladder Electronically Signed   By: Marnee Spring M.D.   On: 03/22/2018 15:11   Dg Chest Port 1 View  Result Date: 03/30/2018 CLINICAL DATA:  Shortness of breath EXAM: PORTABLE CHEST 1 VIEW COMPARISON:  03/30/2018 at 0227 hours FINDINGS: Moderate layering left pleural effusion, increased. Left lower lobe opacity, atelectasis versus pneumonia. Right lung is clear.  No pneumothorax. The heart is normal in size. IMPRESSION: Moderate layering left pleural effusion, increased. Left lower lobe opacity, atelectasis versus pneumonia. Electronically Signed   By: Charline Bills M.D.   On: 03/30/2018 19:06   Dg Chest Port 1 View  Result Date: 03/30/2018 CLINICAL DATA:  Chest pain EXAM: PORTABLE CHEST 1 VIEW COMPARISON:  None. FINDINGS: Retrocardiac opacity, atelectasis versus pneumonia. Possible small left pleural effusion. Right  lung is clear. No pneumothorax. The heart is normal in size. IMPRESSION: Retrocardiac opacity, atelectasis versus pneumonia. Possible small left pleural effusion. Electronically Signed   By: Charline Bills M.D.   On: 03/30/2018 02:49   [place] Ekg Site Rite  Result Date: 04/04/2018 If Site Rite image not attached, placement could not be confirmed due to current cardiac rhythm.  [place] Ekg Site Rite  Result Date: 03/21/2018 If Site Rite image not attached, placement could not be confirmed due to current cardiac rhythm.    LOS: 27 days   Signature  Susa Raring M.D on 04/16/2018 at 9:30 AM  -  To page go to [email or web address] - password [facility]

## 2018-04-17 LAB — CBC
HCT: 28.3 % — ABNORMAL LOW (ref 39.0–52.0)
Hemoglobin: 8.1 g/dL — ABNORMAL LOW (ref 13.0–17.0)
MCH: 27.7 pg (ref 26.0–34.0)
MCHC: 28.6 g/dL — AB (ref 30.0–36.0)
MCV: 96.9 fL (ref 80.0–100.0)
Platelets: 397 10*3/uL (ref 150–400)
RBC: 2.92 MIL/uL — ABNORMAL LOW (ref 4.22–5.81)
RDW: 13.3 % (ref 11.5–15.5)
WBC: 7.2 10*3/uL (ref 4.0–10.5)
nRBC: 0 % (ref 0.0–0.2)

## 2018-04-17 LAB — GLUCOSE, CAPILLARY: Glucose-Capillary: 304 mg/dL — ABNORMAL HIGH (ref 70–99)

## 2018-04-17 LAB — BASIC METABOLIC PANEL
ANION GAP: 8 (ref 5–15)
BUN: 6 mg/dL (ref 6–20)
CO2: 28 mmol/L (ref 22–32)
CREATININE: 0.76 mg/dL (ref 0.61–1.24)
Calcium: 8.2 mg/dL — ABNORMAL LOW (ref 8.9–10.3)
Chloride: 100 mmol/L (ref 98–111)
Glucose, Bld: 351 mg/dL — ABNORMAL HIGH (ref 70–99)
Potassium: 4 mmol/L (ref 3.5–5.1)
SODIUM: 136 mmol/L (ref 135–145)

## 2018-04-17 LAB — PHOSPHORUS: PHOSPHORUS: 3.8 mg/dL (ref 2.5–4.6)

## 2018-04-17 LAB — MAGNESIUM: Magnesium: 1.8 mg/dL (ref 1.7–2.4)

## 2018-04-17 MED ORDER — METOPROLOL TARTRATE 50 MG PO TABS: 50.0000 mg | ORAL_TABLET | Freq: Two times a day (BID) | ORAL | 0 refills | Status: DC

## 2018-04-17 NOTE — Discharge Summary (Signed)
Tony Long ZOX:[id number] DOB: [birth year] DOA: 03/20/2018  PCP: [name], FNP  Admit date: 03/20/2018  Discharge date: 04/17/2018  Admitted From: Home   Disposition:  Home   Recommendations for Outpatient Follow-up:   Follow up with PCP in 1-2 weeks  PCP Please obtain BMP/CBC, 2 view CXR in 1week,  (see Discharge instructions)   PCP Please follow up on the following pending results: Please monitor and follow CBC, BMP and magnesium closely.   Home Health: None Equipment/Devices: None  Consultations: ID, CCS, N >Surg Discharge Condition: Fair CODE STATUS: Full   Diet Recommendation: Heart Healthy Low Carb  CC - Fever   Brief history of present illness from the day of admission and additional interim summary    [name] a 42 y.o. male history of IVDA, DM-2, hypertension admitted for worsening back pain-further evaluation revealed MSSA bacteremia with tricuspid valve endocarditis, epidural abscess involving C2 through sacrum, and numerous soft tissue back abscesses.  Evaluated by general surgery-underwent I&D of her lower back soft tissue abscess, evaluated by neurosurgery-not felt to be a candidate for decompressive surgery due to extensive nature of the disease and lack of any significant neurological findings.  ID following He was initially kept on IV cefazolin and then antibiotic coverage was broadened to Vancomycin and Rocephin on 03/30/2018 by ID.   Currently on IV nafcillin on 04/16/2018, thereafter 4 weeks of Keflex 500 p.o. twice daily per ID.Marland Kitchen   See below for further details                                                                 Hospital Course   MSSA Bacteremia with tricuspid valve endocarditis, extensive epidural abscess (C2 through sacrum) and large soft tissue lower back  abscess  : due to extensive nature of the epidural abscess - neurosurgery does not recommend decompressive surgery. Seen by ID and current recommendation noted, he was initially kept on IV cefazolin and then antibiotic coverage was broadened to Vancomycin and Rocephin on 03/30/2018 by ID. Currently on IV nafcillin until 04/16/2018, thereafter 4 weeks of Keflex 500 p.o. twice daily per ID.. D/w Dr Jolayne Haines on 04/09/18, he confirms antibiotic plan above. Given history of IVDA-not a candidate for outpatient IV Abx therapy. He has received a PICC line, continue IV antibiotics through PICC Line.  Patient doing fairly well.  Cleared to be discharged on 04/17/2018 on oral Keflex.    Cocaine/Substance abuse: Withdrawal symptoms have resolved, continue  Suboxone, he does not want short-acting pain medications anymore.  Polysubstance abuse/IVDA: Counseled to quit.  Anemia.  Normocytic, he had some chronic anemia upon admission as well and now worse due to him dilution from IV fluids also some element of iron deficiency per anemia panel likely due to multiple blood draws.  No signs  of ongoing bleeding or acute blood loss, s/p  1 unit of packed RBC on 04/06/2018 and monitor, hgb is stable, will check CBCs on Tuesdays Thursdays and Saturdays.  Mild lower extremity leg edema. improving with TED stockings.  New onset of left-sided pleuritic chest pain starting on late night 03/29/2018.  Nonspecific EKG changes stable on combination of aspirin, beta-blocker and statin.  Echocardiogram shows no wall motion abnormality and preserved EF, patient remains chest pain-free.  Hypertension: Monitor on beta-blocker.  Acute urinary retention: Continue Foley and Flomax, trial of Foley removal failed on 04/03/2018, likely combination of lack of activity and spine infection, failed voiding trial again on 04/09/2018, Foley reinserted and 1450 mL of urine removed, ,will need outpatient follow-up with urology  discharge.  Recurrent hypokalemia  .   Aggressive replacement, placed on low-dose potassium supplementation, uric acid was low question if he has developed mild Fanconi syndrome, outpatient nephrology follow-up, request PCP to monitor his BMP closely in the outpatient setting.  DM-2: Poor outpatient control, last A1c 12.6, continue Lantus 25 units nightly and sliding scale.   Lab Results  Component Value Date   HGBA1C 12.6 (A) 02/26/2018   CBG (last 3)  Recent Labs    04/16/18 1751 04/16/18 2126 04/17/18 0825  GLUCAP 169* 184* 304*       Discharge diagnosis     Principal Problem:   Opioid use disorder, severe, dependence (HCC) Active Problems:   Non compliance w medication regimen   Chronic hepatitis C without hepatic coma (HCC)   Osteomyelitis (HCC)   Back abscess   MSSA bacteremia   Chest pain on breathing    Discharge instructions    Discharge Instructions    Discharge instructions   Complete by:  As directed    Follow with Primary MD [name], FNP in 7 days   Get CBC, CMP checked  by Primary MD in 5-7 days    Activity: As tolerated with Full fall precautions use walker/cane & assistance as needed  Disposition Home    Diet: Heart Healthy Low Carb  Accuchecks 4 times/day, Once in AM empty stomach and then before each meal. Log in all results and show them to your Prim.MD in 3 days. If any glucose reading is under 80 or above 300 call your Prim MD immidiately. Follow Low glucose instructions for glucose under 80 as instructed.   For Heart failure patients - Check your Weight same time everyday, if you gain over 2 pounds, or you develop in leg swelling, experience more shortness of breath or chest pain, call your Primary MD immediately. Follow Cardiac Low Salt Diet and 1.5 lit/day fluid restriction.  Special Instructions: If you have smoked or chewed Tobacco  in the last 2 yrs please stop smoking, stop any regular Alcohol  and or any  Recreational drug use.  On your next visit with your primary care physician please Get Medicines reviewed and adjusted.  Please request your Prim.MD to go over all Hospital Tests and Procedure/Radiological results at the follow up, please get all Hospital records sent to your Prim MD by signing hospital release before you go home.  If you experience worsening of your admission symptoms, develop shortness of breath, life threatening emergency, suicidal or homicidal thoughts you must seek medical attention immediately by calling 911 or calling your MD immediately  if symptoms less severe.  You Must read complete instructions/literature along with all the possible adverse reactions/side effects for all the Medicines you take and that have  been prescribed to you. Take any new Medicines after you have completely understood and accpet all the possible adverse reactions/side effects.   Do not drive, operate heavy machinery, perform activities at heights, swimming or participation in water activities or provide baby sitting services if your were admitted for syncope or siezures until you have seen by Primary MD or a Neurologist and advised to do so again.  Do not drive when taking Pain medications.  Do not take more than prescribed Pain, Sleep and Anxiety Medications  Wear Seat belts while driving.  Please note  You were cared for by a hospitalist during your hospital stay. If you have any questions about your discharge medications or the care you received while you were in the hospital after you are discharged, you can call the unit and asked to speak with the hospitalist on call if the hospitalist that took care of you is not available. Once you are discharged, your primary care physician will handle any further medical issues. Please note that NO REFILLS for any discharge medications will be authorized once you are discharged, as it is imperative that you return to your primary care physician (or  establish a relationship with a primary care physician if you do not have one) for your aftercare needs so that they can reassess your need for medications and monitor your lab values.   Increase activity slowly   Complete by:  As directed       Discharge Medications   Allergies as of 04/17/2018   No Known Allergies     Medication List    TAKE these medications   cephALEXin 500 MG capsule Commonly known as:  KEFLEX Take 1 capsule (500 mg total) by mouth 2 (two) times daily.   cyclobenzaprine 5 MG tablet Commonly known as:  FLEXERIL Take 1-2 tablets (5-10 mg total) by mouth 3 (three) times daily as needed for muscle spasms.   etodolac 500 MG tablet Commonly known as:  LODINE Take 1 tablet (500 mg total) by mouth 2 (two) times daily.   gabapentin 300 MG capsule Commonly known as:  NEURONTIN Take 1 capsule (300 mg total) by mouth 3 (three) times daily.   glucose blood test strip Use as instructed   HYDROcodone-acetaminophen 5-325 MG tablet Commonly known as:  NORCO/VICODIN Take 1 tablet by mouth every 6 (six) hours as needed for moderate pain.   ibuprofen 600 MG tablet Commonly known as:  ADVIL,MOTRIN Take 1 tablet (600 mg total) by mouth every 8 (eight) hours as needed.   Insulin Glargine 100 UNIT/ML Solostar Pen Commonly known as:  LANTUS Inject 50 Units into the skin daily at 10 pm. What changed:    how much to take  when to take this   LANTUS 100 UNIT/ML injection Generic drug:  insulin glargine INJECT 40 UNITS TOTAL INTO THE SKIN AT BEDTIME. What changed:  Another medication with the same name was changed. Make sure you understand how and when to take each.   insulin lispro 100 UNIT/ML injection Commonly known as:  HUMALOG Inject 0.1 mLs (10 Units total) into the skin 3 (three) times daily with meals.   lisinopril 5 MG tablet Commonly known as:  PRINIVIL,ZESTRIL Take 1 tablet (5 mg total) by mouth daily.   metFORMIN 500 MG tablet Commonly known as:   GLUCOPHAGE Take 1 tablet (500 mg total) by mouth 2 (two) times daily with a meal.   metoprolol tartrate 50 MG tablet Commonly known as:  LOPRESSOR Take 1 tablet (  50 mg total) by mouth 2 (two) times daily.   onetouch ultrasoft lancets Use as instructed   Potassium Chloride ER 20 MEQ Tbcr Take 20 mEq by mouth daily.   pravastatin 40 MG tablet Commonly known as:  PRAVACHOL Take 1 tablet (40 mg total) by mouth daily.   sildenafil 25 MG tablet Commonly known as:  VIAGRA Take 1 tablet (25 mg total) by mouth daily as needed for erectile dysfunction.   tamsulosin 0.4 MG Caps capsule Commonly known as:  FLOMAX Take 1 capsule (0.4 mg total) by mouth daily after supper.       Follow-up Information    [facility] Follow up on 04/22/2018.   Specialty:  General Surgery Why:  Your appointment to have abscesses examined is at 1:30 PM. Be at the office 30 minutes early for check in  Bring photo ID and insurance information. Contact information: [address] [place] [address] [phone]        Maeola Harman, MD. Schedule an appointment as soon as possible for a visit in 1 week(s).   Specialty:  Neurosurgery Why:  Epidural abscess Contact information: [address] [place] [address] [phone]        Gardiner Barefoot, MD. Schedule an appointment as soon as possible for a visit in 1 week(s).   Specialty:  Infectious Diseases Why:  Epidural abscess Contact information: [address] [place] [address] [phone]        [name], FNP. Schedule an appointment as soon as possible for a visit in 1 week(s).   Specialty:  Family Medicine Why:  CBC, BMP, magnesium and phosphorus checked. Contact information: [address] [place] [address] [phone]        Zetta Bills, MD. Schedule an appointment as soon as possible for a visit in 2 week(s).   Specialty:  Nephrology Why:  Recurrent  hypokalemia Contact information: [address] [place] [address] [phone]        Sebastian Ache, MD. Schedule an appointment as soon as possible for a visit in 1 week(s).   Specialty:  Urology Why:  Urinary retention, has indwelling Foley Contact information: [address] [place] [address] [phone]           Major procedures and Radiology Reports - PLEASE review detailed and final reports thoroughly  -     MRI C, T and L-spine done 03/30/2018 and 03/31/2018.    CT angiogram chest 03/30/2018.  1. No pulmonary embolus. 2. Moderate loculated left pleural effusion. Adjacent airspace disease likely compressive atelectasis, pneumonia not excluded in the setting of spinal infection and endocarditis. Mild right pleural thickening or loculated pleural fluid in the upper right hemithorax.  Repeat echocardiogram 03/30/2018 - Left ventricle: The cavity size was normal. Wall thickness was normal. Systolic function was normal. The estimated ejection fraction was in the range of 60% to 65%. Wall motion was normal; there were no regional wall motion abnormalities. Left ventricular diastolic function parameters were normal.  TEE -- Left ventricle: The cavity size was normal. Wall thickness wasnormal. Systolic function was normal. The estimated ejectionfraction was in the range of 60% to 65%. - Aortic valve: No evidence of vegetation. - Mitral valve: No evidence of vegetation. - Left atrium: No evidence of thrombus in the atrial cavity or appendage. No evidence of thrombus in the appendage. - Right atrium: No evidence of thrombus in the atrial cavity orappendage. - Tricuspid  valve:There was a vegetation. There was a small (1.2cmx .6cm), mobile vegetation on the septal leaflet. There was mildregurgitation. - Pulmonic valve: No evidence of vegetation.  Impressions: Tricuspid valve endocarditis. Discussed with primary team   MRI C-T-L Spine -1. Epidural abscess from  C2 to sacrum as described. Maximal cord compression from T3-4 to T5-6. The thecal sac is completely effaced from L1 to L4-5. At the L3-4 interspinous space the spinal abscess communicates with large bilateral abscesses within the intrinsic back muscles. There is osteomyelitis of the L2, L3, and L4 spinous processes. No discitis or facet arthritis. 2. Left paravertebral abscess along the lower thoracic spine with small left empyema that is new from CT 2 days ago. 3. Distended bladder  CT- 1. Evidence of cellulitis involving the right side of the base of the neck extending into the right supraclavicular region with fluid and gas in the soft tissues at the base of the right side of the neck. 2. No significant abnormality of the thoracic spine. 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  CT - 1. Extensive abnormal fluid collection in the subcutaneous fat of the midline of the back with underlying marked abnormality of the posterior paraspinal musculature from L1-2 through S3. This is worrisome for subcutaneous abscess and myositis. 2. Moth-eaten appearance of the spinous processes of L3 and L4 consistent with osteomyelitis. 3. No discrete epidural abscess. However, the abnormal edema in the paraspinal musculature extends to the posterior aspect of the spinal canal at L3-4. 4. MRI with and without contrast may better define the extent of the soft tissue and infection and could detect epidural extension that is not apparent on this unenhanced CT scan.  9/22>> PICC line  I&D by CCS of back soft tissue abscess on -  03/22/18   Ct Angio Chest Pe W Or Wo Contrast  Result Date: 03/30/2018 CLINICAL DATA:  Shortness of breath PE suspected, high pretest prob. Patient with known epidural abscess and endocarditis. Now with left-sided chest pain. EXAM: CT ANGIOGRAPHY CHEST WITH CONTRAST TECHNIQUE: Multidetector CT imaging of the chest was performed using the standard protocol during bolus administration of intravenous  contrast. Multiplanar CT image reconstructions and MIPs were obtained to evaluate the vascular anatomy. CONTRAST:  54mL ISOVUE-370 IOPAMIDOL (ISOVUE-370) INJECTION 76% COMPARISON:  Chest radiographs earlier this day. FINDINGS: Cardiovascular: There are no filling defects within the pulmonary arteries to suggest pulmonary embolus. The thoracic aorta is normal in caliber without dissection. Mild cardiomegaly. Small amount pericardial fluid. Mediastinum/Nodes: Loculated left pleural effusion tracks along the left aspect of the mediastinum. Small left hilar nodes without bulky adenopathy. No enlarged mediastinal nodes. No dominant thyroid nodule. The esophagus is decompressed. Lungs/Pleura: Moderate left pleural effusion is loculated and tracks along the posterior, lateral, and medial hemithorax. Adjacent airspace disease in the left lower lobe, favoring compressive atelectasis, with adjacent compressive atelectasis in the right upper lobe. Pleural thickening versus loculated pleural effusion the upper medial right hemithorax, with additional areas of pleural thickening dependently. Dependent atelectasis in the right lower lobe. Trachea and bronchi are patent. Upper Abdomen: No acute findings. Musculoskeletal: Patient with known epidural abscess, not well delineated by CT. No bony destructive change. Review of the MIP images confirms the above findings. IMPRESSION: 1. No pulmonary embolus. 2. Moderate loculated left pleural effusion. Adjacent airspace disease likely compressive atelectasis, pneumonia not excluded in the setting of spinal infection and endocarditis. Mild right pleural thickening or loculated pleural fluid in the upper right hemithorax. Electronically Signed   By: Narda Rutherford  M.D.   On: 03/30/2018 23:21   Ct Thoracic Spine Wo Contrast  Result Date: 03/20/2018 CLINICAL DATA:  Progressive back pain.  Swelling of the lower back. EXAM: CT THORACIC SPINE WITHOUT CONTRAST TECHNIQUE: Multidetector CT  images of the thoracic were obtained using the standard protocol without intravenous contrast. COMPARISON:  None. FINDINGS: Alignment: Normal. 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 Paraspinal and other soft tissues: There is abnormal gas and fluid in the soft tissues of the right side of the base of the neck and in the right supraclavicular region. Paraspinal soft tissues appear normal throughout the thoracic spine. Disc levels: There is no evidence of disc protrusion or significant disc bulging or spinal or foraminal stenosis or other significant abnormality of the thoracic spine. IMPRESSION: 1. Evidence of cellulitis involving the right side of the base of the neck extending into the right supraclavicular region with fluid and gas in the soft tissues at the base of the right side of the neck. 2. No significant abnormality of the thoracic spine. 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 Electronically Signed   By: Francene Boyers M.D.   On: 03/20/2018 11:25   Ct Lumbar Spine Wo Contrast  Addendum Date: 03/20/2018   ADDENDUM REPORT: 03/20/2018 11:44 ADDENDUM: Critical Value/emergent results were called by telephone at the time of interpretation on 03/20/2018 at 11:30 am to Dr. Sharyn Creamer , who verbally acknowledged these results. Electronically Signed   By: Francene Boyers M.D.   On: 03/20/2018 11:44   Result Date: 03/20/2018 CLINICAL DATA:  Increasing low back pain and soft tissue swelling. EXAM: CT LUMBAR SPINE WITHOUT CONTRAST TECHNIQUE: Multidetector CT imaging of the lumbar spine was performed without intravenous contrast administration. Multiplanar CT image reconstructions were also generated. IV contrast could not be utilized due to the lack of an appropriate IV. COMPARISON:  None. FINDINGS: Segmentation: 5 lumbar type vertebrae. Alignment: Normal. Vertebrae: There is a moth-eaten appearance of the spinous processes of L3 and L4 which is worrisome for osteomyelitis. Bilateral pars defects at L5 with  grade 1 spondylolisthesis. 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 Paraspinal and other soft tissues: There is an extensive abnormal fluid collection in the subcutaneous soft tissues of the posterior aspect of the back extending from approximately L1-2 to S3. This fluid collection is lobulated and measures approximately 20 x 9 x 2.5 cm. It is centered slightly to the left of midline and has a mass effect upon the adjacent posterior paraspinal muscles. There is abnormal lucency in the underlying paraspinal muscles which could represent myositis. Disc levels: T11-12: No significant abnormality. Butterfly T12 vertebra. T12-L1: No significant abnormality. L1-2: Normal disc. Abnormal edema in the posterior paraspinal musculature with adjacent fluid collection in the subcutaneous fat of the posterior aspect of the back as described above. L2-3: Normal disc. L3-4: Normal disc. Lucency in the posterior paraspinal soft tissues extends to the posterior aspect of the thecal sac on image 80 of series 4 but there is no discrete epidural abscess. L4-5: Normal disc.  No evidence of epidural abscess. L5-S1: Grade 1 spondylolisthesis. No disc bulging or protrusion. Bilateral pars defects. No visible epidural abscess. IMPRESSION: 1. Extensive abnormal fluid collection in the subcutaneous fat of the midline of the back with underlying marked abnormality of the posterior paraspinal musculature from L1-2 through S3. This is worrisome for subcutaneous abscess and myositis. 2. Moth-eaten appearance of the spinous processes of L3 and L4 consistent with osteomyelitis. 3. No discrete epidural abscess. However, the abnormal edema in the paraspinal musculature extends  to the posterior aspect of the spinal canal at L3-4. 4. MRI with and without contrast may better define the extent of the soft tissue and infection and could detect epidural extension that is not apparent on this unenhanced CT scan. Electronically Signed: By: Francene Boyers M.D.  On: 03/20/2018 11:18   Mr Cervical Spine Wo Contrast  Result Date: 03/30/2018 CLINICAL DATA:  42 year old male with history of MSSA bacteremia with known endocarditis and epidural abscess extending from C2 through the sacrum. Recent episode of hypotension and acute lower extremity weakness. Evaluate abscess and possible spinal cord infarct. EXAM: MRI CERVICAL, THORACIC SPINE WITHOUT CONTRAST TECHNIQUE: Multiplanar and multiecho pulse sequences of the cervical spine, to include the craniocervical junction and cervicothoracic junction, and thoracic and lumbar spine, were obtained without intravenous contrast. COMPARISON:  Prior MRI from 03/22/2018. FINDINGS: MRI CERVICAL SPINE FINDINGS Alignment: Examination technically limited as the patient was unable to tolerate the full length of the exam. Additionally, images provided are degraded by motion artifact. Reversal of the normal cervical lordosis with apex at C5, stable. No interval listhesis or malalignment. Vertebrae: Vertebral body height maintained without acute or interval fracture. Bone marrow signal intensity diffusely decreased on T1 weighted imaging, suspected to be related to anemia and chronic disease. No discrete osseous lesions. No abnormal marrow edema. No evidence for interval discitis. Cord: Signal intensity within the cervical spinal cord is within normal limits. No findings to suggest interval cord infarction on this motion degraded and limited exam. Previously identified epidural collection involving the ventral epidural space extending from C2 inferiorly is decreased in size, now measuring up to 7 mm in maximal AP diameter at the level of C2. Collection diffusely involves the ventral epidural space, but also is seen dorsally as well. Slightly improved diffuse spinal stenosis with thecal sac patency. Posterior Fossa, vertebral arteries, paraspinal tissues: Visualized brain and posterior fossa within normal limits. Craniocervical junction normal.  Scattered edema within the posterior paraspinous soft tissues. Normal intravascular flow voids seen within the vertebral arteries bilaterally. Disc levels: C2-C3: Unremarkable. C3-C4: Small left foraminal protrusion with associated moderate left C4 foraminal stenosis. C4-C5:  Unremarkable. C5-C6: Left eccentric disc bulge with uncovertebral hypertrophy. Moderate left C6 foraminal narrowing. C6-C7: Right foraminal disc protrusion with associated moderate right C7 foraminal stenosis. C7-T1:  Unremarkable. MRI THORACIC SPINE FINDINGS Alignment: Examination markedly limited as the patient was unable to tolerate the full length of the exam. Sagittal T1, T2, and STIR sequences only were performed. No axial images obtained. Vertebral bodies normally aligned with preservation of the normal thoracic kyphosis. Vertebrae: Vertebral body height maintained without evidence for interval fracture. Butterfly vertebra noted at T12. Diffusely decreased T1 weighted signal intensity throughout the visualized bone marrow, like related to anemia chronic disease. No findings to suggest interval or new discitis or septic arthritis. Cord: Signal intensity within the thoracic spinal cord grossly within normal limits on these limited sagittal views. No definite cord signal abnormality or cord edema to suggest acute spinal cord infarction. Previously seen diffuse epidural collection appears overall decreased in size from previous, with improved spinal stenosis and thecal sac patency. Paraspinal and other soft tissues: Scattered edema seen within the posterior paraspinous soft tissues at the upper back/cervicothoracic junction. No discrete soft tissue collections. Disc levels: T5-6: Central disc protrusion indenting upon the ventral thoracic spinal cord, stable. IMPRESSION: 1. Technically limited exam due to motion artifact and the patient's inability to tolerate the full length of the exam. Cervical and thoracic spine only was imaged, and  only  sagittal sequences of the thoracic spine were obtained. 2. No imaging findings to suggest spinal cord infarction identified on this limited exam. 3. Interval improvement in diffuse epidural collection, decreased in size as compared to previous exam with improved diffuse spinal stenosis and thecal sac patency. No new discitis or facet arthritis. Electronically Signed   By: Rise Mu M.D.   On: 03/30/2018 22:52   Mr Thoracic Spine Wo Contrast  Result Date: 03/30/2018 CLINICAL DATA:  42 year old male with history of MSSA bacteremia with known endocarditis and epidural abscess extending from C2 through the sacrum. Recent episode of hypotension and acute lower extremity weakness. Evaluate abscess and possible spinal cord infarct. EXAM: MRI CERVICAL, THORACIC SPINE WITHOUT CONTRAST TECHNIQUE: Multiplanar and multiecho pulse sequences of the cervical spine, to include the craniocervical junction and cervicothoracic junction, and thoracic and lumbar spine, were obtained without intravenous contrast. COMPARISON:  Prior MRI from 03/22/2018. FINDINGS: MRI CERVICAL SPINE FINDINGS Alignment: Examination technically limited as the patient was unable to tolerate the full length of the exam. Additionally, images provided are degraded by motion artifact. Reversal of the normal cervical lordosis with apex at C5, stable. No interval listhesis or malalignment. Vertebrae: Vertebral body height maintained without acute or interval fracture. Bone marrow signal intensity diffusely decreased on T1 weighted imaging, suspected to be related to anemia and chronic disease. No discrete osseous lesions. No abnormal marrow edema. No evidence for interval discitis. Cord: Signal intensity within the cervical spinal cord is within normal limits. No findings to suggest interval cord infarction on this motion degraded and limited exam. Previously identified epidural collection involving the ventral epidural space extending from C2  inferiorly is decreased in size, now measuring up to 7 mm in maximal AP diameter at the level of C2. Collection diffusely involves the ventral epidural space, but also is seen dorsally as well. Slightly improved diffuse spinal stenosis with thecal sac patency. Posterior Fossa, vertebral arteries, paraspinal tissues: Visualized brain and posterior fossa within normal limits. Craniocervical junction normal. Scattered edema within the posterior paraspinous soft tissues. Normal intravascular flow voids seen within the vertebral arteries bilaterally. Disc levels: C2-C3: Unremarkable. C3-C4: Small left foraminal protrusion with associated moderate left C4 foraminal stenosis. C4-C5:  Unremarkable. C5-C6: Left eccentric disc bulge with uncovertebral hypertrophy. Moderate left C6 foraminal narrowing. C6-C7: Right foraminal disc protrusion with associated moderate right C7 foraminal stenosis. C7-T1:  Unremarkable. MRI THORACIC SPINE FINDINGS Alignment: Examination markedly limited as the patient was unable to tolerate the full length of the exam. Sagittal T1, T2, and STIR sequences only were performed. No axial images obtained. Vertebral bodies normally aligned with preservation of the normal thoracic kyphosis. Vertebrae: Vertebral body height maintained without evidence for interval fracture. Butterfly vertebra noted at T12. Diffusely decreased T1 weighted signal intensity throughout the visualized bone marrow, like related to anemia chronic disease. No findings to suggest interval or new discitis or septic arthritis. Cord: Signal intensity within the thoracic spinal cord grossly within normal limits on these limited sagittal views. No definite cord signal abnormality or cord edema to suggest acute spinal cord infarction. Previously seen diffuse epidural collection appears overall decreased in size from previous, with improved spinal stenosis and thecal sac patency. Paraspinal and other soft tissues: Scattered edema seen  within the posterior paraspinous soft tissues at the upper back/cervicothoracic junction. No discrete soft tissue collections. Disc levels: T5-6: Central disc protrusion indenting upon the ventral thoracic spinal cord, stable. IMPRESSION: 1. Technically limited exam due to motion artifact and the patient's  inability to tolerate the full length of the exam. Cervical and thoracic spine only was imaged, and only sagittal sequences of the thoracic spine were obtained. 2. No imaging findings to suggest spinal cord infarction identified on this limited exam. 3. Interval improvement in diffuse epidural collection, decreased in size as compared to previous exam with improved diffuse spinal stenosis and thecal sac patency. No new discitis or facet arthritis. Electronically Signed   By: Rise Mu M.D.   On: 03/30/2018 22:52   Mr Cervical Spine W Wo Contrast  Result Date: 03/22/2018 CLINICAL DATA:  Spine infection. EXAM: MRI TOTAL SPINE WITHOUT AND WITH CONTRAST TECHNIQUE: Multisequence MR imaging of the spine from the cervical spine to the sacrum was performed prior to and following IV contrast administration. CONTRAST:  6 cc Gadavist intravenous COMPARISON:  CT of the thoracic and lumbar spine from 2 days ago FINDINGS: MRI CERVICAL SPINE FINDINGS Alignment: Normal Vertebrae: No evidence of osseous infection. Canal/Cord: There is extensive spinal fluid collection preferentially in the ventral but also in the right more than left dorsal canal. Subarachnoid space is diffusely effaced. Maximal thickness is posterior to C2 at 9 mm. No cord signal abnormality. Posterior Fossa, vertebral arteries, paraspinal tissues: No retropharyngeal or other discrete soft tissue collection. Disc levels: C2-3: Unremarkable. C3-4: Small left foraminal protrusion with moderate narrowing C4-5: Unremarkable. C5-6: Disc narrowing and bulging with asymmetric left uncovertebral spurring. Left foraminal impingement C6-7: Right foraminal  protrusion mild narrowing. C7-T1:Unremarkable. MRI THORACIC SPINE FINDINGS Alignment:  Normal Vertebrae: No evidence of osteomyelitis or discitis. T12 butterfly vertebra. Canal/Cord: Cervical ventral epidural collection continues throughout the thoracic levels. There is also a focal dorsal component at T3-4 to T5-6, where thecal sac effacement is accentuated and there is cord flattening. No cord edema. Paraspinal and other soft tissues: Paraspinous phlegmon on the left at T8-T12, with new complex left pleural effusion and lower lobe atelectasis Disc levels: T5-6 central disc protrusion. MRI LUMBAR SPINE FINDINGS Segmentation:  5 lumbar type vertebral bodies Alignment:  Grade 1 anterolisthesis at L5-S1. Vertebrae: Marrow edema and heterogeneous enhancement within the L2, L3, and L4 spinous processes. No discitis or facet edema. Conus medullaris: Extends to the L1 level and is non edematous. There is extensive epidural collection completely effacing the thecal sac throughout the lumbar spine until L4-5 and below where the collection becomes ventral and right eccentric. The infection communicates with extensive bilateral abscess within the intrinsic back muscles via the interspinous space at L3-4. Patient had recent subcutaneous collection and left buttocks collection drainage, with packing seen in place. This midline, upper subcutaneous collection communicates with the paravertebral abscess along its superior margin based on postcontrast axial images. Paraspinal and other soft tissues: As above.  Distended bladder Disc levels: Chronic bilateral pars defects at L5. Critical Value/emergent results were called by telephone at the time of interpretation on 03/22/2018 at 3:04 pm to Dr. Thedore Mins , who verbally acknowledged these results. IMPRESSION: 1. Epidural abscess from C2 to sacrum as described. Maximal cord compression from T3-4 to T5-6. The thecal sac is completely effaced from L1 to L4-5. At the L3-4 interspinous space  the spinal abscess communicates with large bilateral abscesses within the intrinsic back muscles. There is osteomyelitis of the L2, L3, and L4 spinous processes. No discitis or facet arthritis. 2. Left paravertebral abscess along the lower thoracic spine with small left empyema that is new from CT 2 days ago. 3. Distended bladder Electronically Signed   By: Marnee Spring M.D.   On:  03/22/2018 15:11   Mr Thoracic Spine W Wo Contrast  Result Date: 03/22/2018 CLINICAL DATA:  Spine infection. EXAM: MRI TOTAL SPINE WITHOUT AND WITH CONTRAST TECHNIQUE: Multisequence MR imaging of the spine from the cervical spine to the sacrum was performed prior to and following IV contrast administration. CONTRAST:  6 cc Gadavist intravenous COMPARISON:  CT of the thoracic and lumbar spine from 2 days ago FINDINGS: MRI CERVICAL SPINE FINDINGS Alignment: Normal Vertebrae: No evidence of osseous infection. Canal/Cord: There is extensive spinal fluid collection preferentially in the ventral but also in the right more than left dorsal canal. Subarachnoid space is diffusely effaced. Maximal thickness is posterior to C2 at 9 mm. No cord signal abnormality. Posterior Fossa, vertebral arteries, paraspinal tissues: No retropharyngeal or other discrete soft tissue collection. Disc levels: C2-3: Unremarkable. C3-4: Small left foraminal protrusion with moderate narrowing C4-5: Unremarkable. C5-6: Disc narrowing and bulging with asymmetric left uncovertebral spurring. Left foraminal impingement C6-7: Right foraminal protrusion mild narrowing. C7-T1:Unremarkable. MRI THORACIC SPINE FINDINGS Alignment:  Normal Vertebrae: No evidence of osteomyelitis or discitis. T12 butterfly vertebra. Canal/Cord: Cervical ventral epidural collection continues throughout the thoracic levels. There is also a focal dorsal component at T3-4 to T5-6, where thecal sac effacement is accentuated and there is cord flattening. No cord edema. Paraspinal and other soft  tissues: Paraspinous phlegmon on the left at T8-T12, with new complex left pleural effusion and lower lobe atelectasis Disc levels: T5-6 central disc protrusion. MRI LUMBAR SPINE FINDINGS Segmentation:  5 lumbar type vertebral bodies Alignment:  Grade 1 anterolisthesis at L5-S1. Vertebrae: Marrow edema and heterogeneous enhancement within the L2, L3, and L4 spinous processes. No discitis or facet edema. Conus medullaris: Extends to the L1 level and is non edematous. There is extensive epidural collection completely effacing the thecal sac throughout the lumbar spine until L4-5 and below where the collection becomes ventral and right eccentric. The infection communicates with extensive bilateral abscess within the intrinsic back muscles via the interspinous space at L3-4. Patient had recent subcutaneous collection and left buttocks collection drainage, with packing seen in place. This midline, upper subcutaneous collection communicates with the paravertebral abscess along its superior margin based on postcontrast axial images. Paraspinal and other soft tissues: As above.  Distended bladder Disc levels: Chronic bilateral pars defects at L5. Critical Value/emergent results were called by telephone at the time of interpretation on 03/22/2018 at 3:04 pm to Dr. Thedore Mins , who verbally acknowledged these results. IMPRESSION: 1. Epidural abscess from C2 to sacrum as described. Maximal cord compression from T3-4 to T5-6. The thecal sac is completely effaced from L1 to L4-5. At the L3-4 interspinous space the spinal abscess communicates with large bilateral abscesses within the intrinsic back muscles. There is osteomyelitis of the L2, L3, and L4 spinous processes. No discitis or facet arthritis. 2. Left paravertebral abscess along the lower thoracic spine with small left empyema that is new from CT 2 days ago. 3. Distended bladder Electronically Signed   By: Marnee Spring M.D.   On: 03/22/2018 15:11   Mr Lumbar Spine W Wo  Contrast  Result Date: 03/22/2018 CLINICAL DATA:  Spine infection. EXAM: MRI TOTAL SPINE WITHOUT AND WITH CONTRAST TECHNIQUE: Multisequence MR imaging of the spine from the cervical spine to the sacrum was performed prior to and following IV contrast administration. CONTRAST:  6 cc Gadavist intravenous COMPARISON:  CT of the thoracic and lumbar spine from 2 days ago FINDINGS: MRI CERVICAL SPINE FINDINGS Alignment: Normal Vertebrae: No evidence of osseous infection.  Canal/Cord: There is extensive spinal fluid collection preferentially in the ventral but also in the right more than left dorsal canal. Subarachnoid space is diffusely effaced. Maximal thickness is posterior to C2 at 9 mm. No cord signal abnormality. Posterior Fossa, vertebral arteries, paraspinal tissues: No retropharyngeal or other discrete soft tissue collection. Disc levels: C2-3: Unremarkable. C3-4: Small left foraminal protrusion with moderate narrowing C4-5: Unremarkable. C5-6: Disc narrowing and bulging with asymmetric left uncovertebral spurring. Left foraminal impingement C6-7: Right foraminal protrusion mild narrowing. C7-T1:Unremarkable. MRI THORACIC SPINE FINDINGS Alignment:  Normal Vertebrae: No evidence of osteomyelitis or discitis. T12 butterfly vertebra. Canal/Cord: Cervical ventral epidural collection continues throughout the thoracic levels. There is also a focal dorsal component at T3-4 to T5-6, where thecal sac effacement is accentuated and there is cord flattening. No cord edema. Paraspinal and other soft tissues: Paraspinous phlegmon on the left at T8-T12, with new complex left pleural effusion and lower lobe atelectasis Disc levels: T5-6 central disc protrusion. MRI LUMBAR SPINE FINDINGS Segmentation:  5 lumbar type vertebral bodies Alignment:  Grade 1 anterolisthesis at L5-S1. Vertebrae: Marrow edema and heterogeneous enhancement within the L2, L3, and L4 spinous processes. No discitis or facet edema. Conus medullaris: Extends  to the L1 level and is non edematous. There is extensive epidural collection completely effacing the thecal sac throughout the lumbar spine until L4-5 and below where the collection becomes ventral and right eccentric. The infection communicates with extensive bilateral abscess within the intrinsic back muscles via the interspinous space at L3-4. Patient had recent subcutaneous collection and left buttocks collection drainage, with packing seen in place. This midline, upper subcutaneous collection communicates with the paravertebral abscess along its superior margin based on postcontrast axial images. Paraspinal and other soft tissues: As above.  Distended bladder Disc levels: Chronic bilateral pars defects at L5. Critical Value/emergent results were called by telephone at the time of interpretation on 03/22/2018 at 3:04 pm to Dr. Thedore Mins , who verbally acknowledged these results. IMPRESSION: 1. Epidural abscess from C2 to sacrum as described. Maximal cord compression from T3-4 to T5-6. The thecal sac is completely effaced from L1 to L4-5. At the L3-4 interspinous space the spinal abscess communicates with large bilateral abscesses within the intrinsic back muscles. There is osteomyelitis of the L2, L3, and L4 spinous processes. No discitis or facet arthritis. 2. Left paravertebral abscess along the lower thoracic spine with small left empyema that is new from CT 2 days ago. 3. Distended bladder Electronically Signed   By: Marnee Spring M.D.   On: 03/22/2018 15:11   Dg Chest Port 1 View  Result Date: 03/30/2018 CLINICAL DATA:  Shortness of breath EXAM: PORTABLE CHEST 1 VIEW COMPARISON:  03/30/2018 at 0227 hours FINDINGS: Moderate layering left pleural effusion, increased. Left lower lobe opacity, atelectasis versus pneumonia. Right lung is clear.  No pneumothorax. The heart is normal in size. IMPRESSION: Moderate layering left pleural effusion, increased. Left lower lobe opacity, atelectasis versus pneumonia.  Electronically Signed   By: Charline Bills M.D.   On: 03/30/2018 19:06   Dg Chest Port 1 View  Result Date: 03/30/2018 CLINICAL DATA:  Chest pain EXAM: PORTABLE CHEST 1 VIEW COMPARISON:  None. FINDINGS: Retrocardiac opacity, atelectasis versus pneumonia. Possible small left pleural effusion. Right lung is clear. No pneumothorax. The heart is normal in size. IMPRESSION: Retrocardiac opacity, atelectasis versus pneumonia. Possible small left pleural effusion. Electronically Signed   By: Charline Bills M.D.   On: 03/30/2018 02:49   [place] [employer]  Result Date: 04/04/2018 If Site Rite image not attached, placement could not be confirmed due to current cardiac rhythm.  [place] E[employer] Site Rite  Result Date: 03/21/2018 If Site Rite image not attached, placement could not be confirmed due to current cardiac rhythm.   Micro Results     No results found for this or any previous visit (from the past 240 hour(s)).  Today   Subjective    Tony Long today has no headache,no chest abdominal pain,no new weakness tingling or numbness, feels much better wants to go home today.     Objective   Blood pressure 150/85, pulse 87, temperature 98.6 F (37 C), resp. rate 17, height 5\' 11"  (1.803 m), weight 68 kg, SpO2 97 %.   Intake/Output Summary (Last 24 hours) at 04/17/2018 0919 Last data filed at 04/17/2018 0556 Gross per 24 hour  Intake -  Output 900 ml  Net -900 ml    Exam Awake Alert, Oriented x 3, No new F.N deficits, Normal affect [place].AT,PERRAL Supple Neck,No JVD, No cervical lymphadenopathy appriciated.  Symmetrical Chest wall movement, Good air movement bilaterally, CTAB RRR,No Gallops,Rubs or new Murmurs, No Parasternal Heave +ve B.Sounds, Abd Soft, Non tender, No organomegaly appriciated, No rebound -guarding or rigidity. No Cyanosis, Clubbing or edema, No new Rash or bruise, Foley in place   Data Review   CBC w Diff:  Lab Results  Component Value Date   WBC 7.2  04/17/2018   HGB 8.1 (L) 04/17/2018   HCT 28.3 (L) 04/17/2018   PLT 397 04/17/2018   LYMPHOPCT 4 03/20/2018   MONOPCT 6 03/20/2018   EOSPCT 0 03/20/2018   BASOPCT 1 03/20/2018    CMP:  Lab Results  Component Value Date   NA 136 04/17/2018   NA 137 10/14/2017   K 4.0 04/17/2018   CL 100 04/17/2018   CO2 28 04/17/2018   BUN 6 04/17/2018   BUN 9 10/14/2017   CREATININE 0.76 04/17/2018   CREATININE 0.77 12/11/2016   PROT 6.5 04/02/2018   PROT 8.5 10/14/2017   ALBUMIN 1.5 (L) 04/02/2018   ALBUMIN 4.9 10/14/2017   BILITOT 0.7 04/02/2018   BILITOT 0.4 10/14/2017   ALKPHOS 140 (H) 04/02/2018   AST 22 04/02/2018   ALT 14 04/02/2018   ALT BLOOD 03/23/2018  .   Total Time in preparing paper work, data evaluation and todays exam - 35 minutes  Susa Raring M.D on 04/17/2018 at 9:19 AM  [facility]   Office  [phone]

## 2018-04-17 NOTE — Discharge Instructions (Signed)
Follow with Primary MD Tony Locks, FNP in 7 days   Get CBC, CMP checked  by Primary MD in 5-7 days    Activity: As tolerated with Full fall precautions use walker/cane & assistance as needed  Disposition Home    Diet: Heart Healthy Low Carb  Accuchecks 4 times/day, Once in AM empty stomach and then before each meal. Log in all results and show them to your Prim.MD in 3 days. If any glucose reading is under 80 or above 300 call your Prim MD immidiately. Follow Low glucose instructions for glucose under 80 as instructed.   For Heart failure patients - Check your Weight same time everyday, if you gain over 2 pounds, or you develop in leg swelling, experience more shortness of breath or chest pain, call your Primary MD immediately. Follow Cardiac Low Salt Diet and 1.5 lit/day fluid restriction.  Special Instructions: If you have smoked or chewed Tobacco  in the last 2 yrs please stop smoking, stop any regular Alcohol  and or any Recreational drug use.  On your next visit with your primary care physician please Get Medicines reviewed and adjusted.  Please request your Prim.MD to go over all Hospital Tests and Procedure/Radiological results at the follow up, please get all Hospital records sent to your Prim MD by signing hospital release before you go home.  If you experience worsening of your admission symptoms, develop shortness of breath, life threatening emergency, suicidal or homicidal thoughts you must seek medical attention immediately by calling 911 or calling your MD immediately  if symptoms less severe.  You Must read complete instructions/literature along with all the possible adverse reactions/side effects for all the Medicines you take and that have been prescribed to you. Take any new Medicines after you have completely understood and accpet all the possible adverse reactions/side effects.   Do not drive, operate heavy machinery, perform activities at heights, swimming or  participation in water activities or provide baby sitting services if your were admitted for syncope or siezures until you have seen by Primary MD or a Neurologist and advised to do so again.  Do not drive when taking Pain medications.  Do not take more than prescribed Pain, Sleep and Anxiety Medications  Wear Seat belts while driving.  Please note  You were cared for by a hospitalist during your hospital stay. If you have any questions about your discharge medications or the care you received while you were in the hospital after you are discharged, you can call the unit and asked to speak with the hospitalist on call if the hospitalist that took care of you is not available. Once you are discharged, your primary care physician will handle any further medical issues. Please note that NO REFILLS for any discharge medications will be authorized once you are discharged, as it is imperative that you return to your primary care physician (or establish a relationship with a primary care physician if you do not have one) for your aftercare needs so that they can reassess your need for medications and monitor your lab values.       Mechanical Wound Debridement Shower and change dressing twice a day.  Mechanical wound debridement is a treatment to remove dead tissue from a wound. This helps the wound heal. The treatment involves cleaning the wound (irrigation) and using a pad or gauze (dressing) to remove dead tissue and debris from the wound. There are different types of mechanical wound debridement. Depending on the wound, you  may need to repeat this procedure or change to another form of debridement as your wound starts to heal. Tell a health care provider about:  Any allergies you have.  All medicines you are taking, including vitamins, herbs, eye drops, creams, and over-the-counter medicines.  Any blood disorders you have.  Any medical conditions you have, including any conditions  that: ? Cause a significant decrease in blood circulation to the part of the body where the wound is, such as peripheral vascular disease. ? Compromise your defense (immune) system or white blood count.  Any surgeries you have had.  Whether you are pregnant or may be pregnant. What are the risks? Generally, this is a safe procedure. However, problems may occur, including:  Infection.  Bleeding.  Damage to healthy tissue in and around your wound.  Soreness or pain.  Failure of the wound to heal.  Scarring.  What happens before the procedure? You may be given antibiotic medicine to help prevent infection. What happens during the procedure?  Your health care provider may apply a numbing medicine (topical anesthetic) to the wound.  Your health care provider will irrigate your wound with a germ-free (sterile), salt-water (saline) solution. This removes debris, bacteria, and dead tissue.  Depending on what type of mechanical wound debridement you are having, your health care provider may do one of the following: ? Put a dressing on your wound. You may have dry gauze pad placed into the wound. Your health care provider will remove the gauze after the wound is dry. Any dead tissue and debris that has dried into the gauze will be lifted out of the wound (wet-to-dry debridement). ? Use a type of pad (monofilament fiber debridement pad). This pad has a fluffy surface on one side that picks up dead tissue and debris from your wound. Your health care provider wets the pad and wipes it over your wound for several minutes. ? Irrigate your wound with a pressurized stream of solution such as saline or water.  Once your health care provider is finished, he or she may apply a light dressing to your wound. The procedure may vary among health care providers and hospitals. What happens after the procedure?  You may receive medicine for pain.  You will continue to receive antibiotic medicine if it  was started before your procedure. This information is not intended to replace advice given to you by your health care provider. Make sure you discuss any questions you have with your health care provider. Document Released: 03/07/2015 Document Revised: 11/22/2015 Document Reviewed: 10/25/2014 Elsevier Interactive Patient Education  [employer].

## 2018-04-19 ENCOUNTER — Telehealth: Payer: Self-pay | Admitting: Behavioral Health

## 2018-04-19 MED FILL — HumaLOG 100 UNIT/ML SOLN: 100 | 28 days supply | Qty: 10 | Fill #1

## 2018-04-19 NOTE — Telephone Encounter (Signed)
Called Tony Long, scheduled a [facility] appointment with Marcos Eke 04/27/2018.  Directions, appointment date and time given to patient and he verbalized understanding. Angeline Slim RN

## 2018-04-19 NOTE — Telephone Encounter (Signed)
Thank you :)

## 2018-04-20 ENCOUNTER — Telehealth: Payer: Self-pay

## 2018-04-20 MED ORDER — INSULIN LISPRO 100 UNIT/ML ~~LOC~~ SOLN: 10.0000 [IU] | Freq: Three times a day (TID) | SUBCUTANEOUS | 1 refills | Status: DC

## 2018-04-20 NOTE — Telephone Encounter (Signed)
Medication sent to pharmacy  

## 2018-04-27 ENCOUNTER — Ambulatory Visit (INDEPENDENT_AMBULATORY_CARE_PROVIDER_SITE_OTHER): Payer: Medicaid Other | Admitting: Family

## 2018-04-27 ENCOUNTER — Encounter: Payer: Self-pay | Admitting: Family

## 2018-04-27 VITALS — BP 128/81 | HR 93 | Temp 98.2°F | Ht 71.0 in | Wt 154.0 lb

## 2018-04-27 DIAGNOSIS — M869 Osteomyelitis, unspecified: Secondary | ICD-10-CM | POA: Diagnosis not present

## 2018-04-27 DIAGNOSIS — I079 Rheumatic tricuspid valve disease, unspecified: Secondary | ICD-10-CM

## 2018-04-27 DIAGNOSIS — I368 Other nonrheumatic tricuspid valve disorders: Secondary | ICD-10-CM

## 2018-04-27 DIAGNOSIS — L02212 Cutaneous abscess of back [any part, except buttock]: Secondary | ICD-10-CM

## 2018-04-27 DIAGNOSIS — R7881 Bacteremia: Secondary | ICD-10-CM

## 2018-04-27 DIAGNOSIS — F1120 Opioid dependence, uncomplicated: Secondary | ICD-10-CM | POA: Diagnosis not present

## 2018-04-27 DIAGNOSIS — R338 Other retention of urine: Secondary | ICD-10-CM | POA: Diagnosis not present

## 2018-04-27 MED ORDER — CEPHALEXIN 500 MG PO CAPS: 500.0000 mg | ORAL_CAPSULE | Freq: Three times a day (TID) | ORAL | 0 refills | Status: AC

## 2018-04-27 NOTE — Patient Instructions (Signed)
Nice to meet you.  Glad to hear that you are doing better.  We will check your blood work today.  Increase your daily dose of Keflex to 3 times per day (breakfast, lunch, dinner)  Plan for follow up the week of November 18th with your completion date of November 15th.

## 2018-04-27 NOTE — Progress Notes (Signed)
Subjective:    Patient ID: Tony Long, male    DOB: 1976/03/05, 42 y.o.   MRN: [id number]  Chief Complaint  Patient presents with  . Hospitalization Follow-up    abscess lower back    HPI:  Tony Long is a 42 y.o. male who presents today for hospitalization follow up of discitis   Tony Long was admitted to the hospital on 03/20/2018 with worsening back pain and found to have MSSA bacteremia, tricuspid valve endocarditis, and epidural abscess involving C2 through his sacrum amongst numerous soft tissue abscesses. Neurosurgery was consulted and determined not to be a candidate for surgical intervention. He was initially started on Cefazolin and then broadened to vancomycin and ceftriaxone on 10/1/and then changed to Nafcillin. He received a total of 4 weeks of IV antibiotic therapy and was transitioned to Keflex 500 mg twice daily at discharge due to his history of IV drug use. He is scheduled to complete the Keflex on or around 05/14/18. All hospital labs, records, and imaging were reviewed in detail.  Tony Long has been taking his Keflex as prescribed with no missed doses or adverse side effects. He has not had fevers, chills, or sweats. Continues to have back pain that is slowly improving. Ambulating with a cane for assistance. Following with urology with continued difficulty with voiding. He is able to have a bowel movement without problem. Denies any recreational or illicit substance abuse and remains on the Subutex presently.  No Known Allergies    Outpatient Medications Prior to Visit  Medication Sig Dispense Refill  . cyclobenzaprine (FLEXERIL) 5 MG tablet Take 1-2 tablets (5-10 mg total) by mouth 3 (three) times daily as needed for muscle spasms. 20 tablet 0  . etodolac (LODINE) 500 MG tablet Take 1 tablet (500 mg total) by mouth 2 (two) times daily. 30 tablet 0  . gabapentin (NEURONTIN) 300 MG capsule Take 1 capsule (300 mg total) by mouth 3 (three) times daily. 90  capsule 6  . glucose blood test strip Use as instructed 100 each 12  . HYDROcodone-acetaminophen (NORCO) 5-325 MG tablet Take 1 tablet by mouth every 6 (six) hours as needed for moderate pain. 15 tablet 0  . ibuprofen (ADVIL,MOTRIN) 600 MG tablet Take 1 tablet (600 mg total) by mouth every 8 (eight) hours as needed. 30 tablet 0  . Insulin Glargine (LANTUS) 100 UNIT/ML Solostar Pen Inject 50 Units into the skin daily at 10 pm. (Patient taking differently: Inject 40 Units into the skin at bedtime. ) 45 mL 3  . insulin lispro (HUMALOG) 100 UNIT/ML injection Inject 0.1 mLs (10 Units total) into the skin 3 (three) times daily with meals. 60 mL 1  . Lancets (ONETOUCH ULTRASOFT) lancets Use as instructed 100 each 12  . LANTUS 100 UNIT/ML injection INJECT 40 UNITS TOTAL INTO THE SKIN AT BEDTIME. 10 mL 0  . lisinopril (PRINIVIL,ZESTRIL) 5 MG tablet Take 1 tablet (5 mg total) by mouth daily. 90 tablet 3  . metFORMIN (GLUCOPHAGE) 500 MG tablet Take 1 tablet (500 mg total) by mouth 2 (two) times daily with a meal. 180 tablet 3  . metoprolol tartrate (LOPRESSOR) 50 MG tablet Take 1 tablet (50 mg total) by mouth 2 (two) times daily. 60 tablet 0  . potassium chloride 20 MEQ TBCR Take 20 mEq by mouth daily. 30 tablet 0  . potassium chloride SA (K-DUR,KLOR-CON) 20 MEQ tablet Take 1 tablet by mouth daily.  0  . pravastatin (PRAVACHOL) 40 MG tablet Take 1 tablet (  40 mg total) by mouth daily. 90 tablet 3  . sildenafil (VIAGRA) 25 MG tablet Take 1 tablet (25 mg total) by mouth daily as needed for erectile dysfunction. 90 tablet 3  . tamsulosin (FLOMAX) 0.4 MG CAPS capsule Take 1 capsule (0.4 mg total) by mouth daily after supper. 30 capsule 0  . cephALEXin (KEFLEX) 500 MG capsule Take 1 capsule (500 mg total) by mouth 2 (two) times daily. 60 capsule 0   No facility-administered medications prior to visit.      Past Medical History:  Diagnosis Date  . Diabetes mellitus without complication [facility])       Past  Surgical History:  Procedure Laterality Date  . INCISION AND DRAINAGE ABSCESS N/A 03/22/2018   Procedure: INCISION AND DRAINAGE ABSCESS;  Surgeon: Violeta Gelinas, MD;  Location: [facility] OR;  Service: General;  Laterality: N/A;  . TEE WITHOUT CARDIOVERSION  03/22/2018   Procedure: TRANSESOPHAGEAL ECHOCARDIOGRAM (TEE);  Surgeon: Radiologist, Medication, MD;  Location: [facility] OR;  Service: Open Heart Surgery;;      Family History  Problem Relation Age of Onset  . Heart disease Father   . Diabetes Father   . Diabetes Paternal Uncle       Social History   Socioeconomic History  . Marital status: Single    Spouse name: Not on file  . Number of children: Not on file  . Years of education: Not on file  . Highest education level: Not on file  Occupational History  . Not on file  Social Needs  . Financial resource strain: Not on file  . Food insecurity:    Worry: Not on file    Inability: Not on file  . Transportation needs:    Medical: Not on file    Non-medical: Not on file  Tobacco Use  . Smoking status: Current Every Day Smoker    Packs/day: 1.00  . Smokeless tobacco: Never Used  Substance and Sexual Activity  . Alcohol use: Not Currently    Comment: seldom  . Drug use: No    Comment: pt mother said hx of use  . Sexual activity: Yes    Partners: Female  Lifestyle  . Physical activity:    Days per week: Not on file    Minutes per session: Not on file  . Stress: Not on file  Relationships  . Social connections:    Talks on phone: Not on file    Gets together: Not on file    Attends religious service: Not on file    Active member of club or organization: Not on file    Attends meetings of clubs or organizations: Not on file    Relationship status: Not on file  . Intimate partner violence:    Fear of current or ex partner: Not on file    Emotionally abused: Not on file    Physically abused: Not on file    Forced sexual activity: Not on file  Other Topics Concern  . Not  on file  Social History Narrative  . Not on file      Review of Systems  Constitutional: Negative for chills, diaphoresis, fatigue and fever.  Respiratory: Negative for cough, chest tightness, shortness of breath and wheezing.   Cardiovascular: Negative for chest pain.  Gastrointestinal: Negative for abdominal distention, abdominal pain, constipation, diarrhea, nausea and vomiting.  Musculoskeletal: Positive for back pain.  Neurological: Negative for weakness and headaches.  Hematological: Does not bruise/bleed easily.       Objective:  BP 128/81   Pulse 93   Temp 98.2 F (36.8 C)   Ht 5\' 11"  (1.803 m)   Wt 154 lb (69.9 kg)   BMI 21.48 kg/m  Nursing note and vital signs reviewed.  Physical Exam  Constitutional: He is oriented to person, place, and time. He appears well-developed and well-nourished. No distress.  Seated in the chair; cane next to him; pleasant.   Cardiovascular: Normal rate, regular rhythm, normal heart sounds and intact distal pulses. Exam reveals no gallop and no friction rub.  No murmur heard. Pulmonary/Chest: Effort normal and breath sounds normal. No stridor. He has no wheezes. He has no rales. He exhibits no tenderness.  Neurological: He is alert and oriented to person, place, and time.  Skin: Skin is warm and dry.  Psychiatric: He has a normal mood and affect. His behavior is normal. Judgment and thought content normal.        Assessment & Plan:   Problem List Items Addressed This Visit      Cardiovascular and Mediastinum   Endocarditis of tricuspid valve    Tony Long continues to receive treatment for tricuspid valve endocarditis likely with MSSA with Keflex.  He has no symptoms currently.  Will need 2D echocardiogram at the completion of treatment to determine need for cardiology assessment.  Increase Keflex to 500 mg 3 times daily.        Musculoskeletal and Integument   Osteomyelitis [facility]) - Primary    Tony Long continues to  receive treatment for osteomyelitis and multiple abscesses likely infected with MSSA.  He is adherent to the Keflex with continued improvement in his back pain.  He will need additional imaging near the completion of treatment to determine resolution of the abscesses.  Check complete metabolic profile and inflammatory markers today.  Will increase Keflex to 500 mg 3 times daily.  Plan for follow-up prior to completion of treatment around 05/14/2018 or sooner if needed.      Relevant Medications   cephALEXin (KEFLEX) 500 MG capsule   Other Relevant Orders   Comprehensive metabolic panel   Sedimentation rate (Completed)   C-reactive protein (Completed)     Other   Back abscess    See plan of care under osteomyelitis      MSSA bacteremia    No current fevers, chills, or sweats.  MSSA bacteremia likely resolved since leaving the hospital.  Continue to monitor.      Opioid use disorder, severe, dependence (HCC)    Per patient he has remained sober since leaving the hospital and remains on Subutex for opioid use disorder and has no current cravings.  Encouraged to attend Narcotics Anonymous or Alcoholics Anonymous with counseling to aid in continued recovery.  He has a provider for his Subutex.  Continue to monitor.          I have changed Jayten Winton's cephALEXin. I am also having him maintain his glucose blood, onetouch ultrasoft, pravastatin, metFORMIN, lisinopril, ibuprofen, gabapentin, Insulin Glargine, sildenafil, LANTUS, HYDROcodone-acetaminophen, etodolac, cyclobenzaprine, tamsulosin, Potassium Chloride ER, metoprolol tartrate, insulin lispro, and potassium chloride SA.   Meds ordered this encounter  Medications  . cephALEXin (KEFLEX) 500 MG capsule    Sig: Take 1 capsule (500 mg total) by mouth 3 (three) times daily for 5 days.    Dispense:  14 capsule    Refill:  0    Order Specific Question:   Supervising Provider    Answer:   Judyann Munson [phone]  Follow-up:  Return in about 3 weeks (around 05/18/2018), or if symptoms worsen or fail to improve.    Marcos Eke, MSN, FNP-C Nurse Practitioner [facility] for Infectious Disease [facility] Office phone: [phone] Pager: [phone] [facility] Main number: [phone]

## 2018-04-28 ENCOUNTER — Encounter: Payer: Self-pay | Admitting: Family

## 2018-04-28 DIAGNOSIS — I079 Rheumatic tricuspid valve disease, unspecified: Secondary | ICD-10-CM | POA: Insufficient documentation

## 2018-04-28 DIAGNOSIS — I368 Other nonrheumatic tricuspid valve disorders: Secondary | ICD-10-CM

## 2018-04-28 LAB — C-REACTIVE PROTEIN: CRP: 27.4 mg/L — ABNORMAL HIGH (ref <8.0)

## 2018-04-28 LAB — SEDIMENTATION RATE

## 2018-04-28 NOTE — Assessment & Plan Note (Addendum)
Tony Long continues to receive treatment for osteomyelitis and multiple abscesses likely infected with MSSA.  He is adherent to the Keflex with continued improvement in his back pain.  He will need additional imaging near the completion of treatment to determine resolution of the abscesses.  Check complete metabolic profile and inflammatory markers today.  Will increase Keflex to 500 mg 3 times daily.  Plan for follow-up prior to completion of treatment around 05/14/2018 or sooner if needed.

## 2018-04-28 NOTE — Assessment & Plan Note (Signed)
No current fevers, chills, or sweats.  MSSA bacteremia likely resolved since leaving the hospital.  Continue to monitor.

## 2018-04-28 NOTE — Assessment & Plan Note (Signed)
Tony Long continues to receive treatment for tricuspid valve endocarditis likely with MSSA with Keflex.  He has no symptoms currently.  Will need 2D echocardiogram at the completion of treatment to determine need for cardiology assessment.  Increase Keflex to 500 mg 3 times daily.

## 2018-04-28 NOTE — Assessment & Plan Note (Signed)
Per patient he has remained sober since leaving the hospital and remains on Subutex for opioid use disorder and has no current cravings.  Encouraged to attend Narcotics Anonymous or Alcoholics Anonymous with counseling to aid in continued recovery.  He has a provider for his Subutex.  Continue to monitor.

## 2018-04-28 NOTE — Assessment & Plan Note (Signed)
See plan of care under osteomyelitis

## 2018-05-03 ENCOUNTER — Other Ambulatory Visit: Payer: Self-pay

## 2018-05-03 ENCOUNTER — Telehealth: Payer: Self-pay | Admitting: Behavioral Health

## 2018-05-03 ENCOUNTER — Other Ambulatory Visit: Payer: Self-pay | Admitting: Behavioral Health

## 2018-05-03 DIAGNOSIS — M869 Osteomyelitis, unspecified: Secondary | ICD-10-CM

## 2018-05-03 NOTE — Telephone Encounter (Signed)
Called patient to cancel lab appointment he has scheduled for today.  Patient should not have had a lab appointment scheduled for today.  Left a message and explained on message that lab appointment was cancelled and the office call back number. Angeline Slim RN

## 2018-05-04 ENCOUNTER — Other Ambulatory Visit: Payer: Medicaid Other

## 2018-05-17 ENCOUNTER — Encounter: Payer: Self-pay | Admitting: Infectious Diseases

## 2018-05-17 ENCOUNTER — Ambulatory Visit (INDEPENDENT_AMBULATORY_CARE_PROVIDER_SITE_OTHER): Payer: Medicaid Other | Admitting: Infectious Diseases

## 2018-05-17 VITALS — BP 147/83 | HR 85 | Temp 98.6°F | Wt 150.0 lb

## 2018-05-17 DIAGNOSIS — T8189XD Other complications of procedures, not elsewhere classified, subsequent encounter: Secondary | ICD-10-CM | POA: Diagnosis not present

## 2018-05-17 DIAGNOSIS — L02212 Cutaneous abscess of back [any part, except buttock]: Secondary | ICD-10-CM

## 2018-05-17 DIAGNOSIS — R7881 Bacteremia: Secondary | ICD-10-CM | POA: Diagnosis not present

## 2018-05-17 DIAGNOSIS — I368 Other nonrheumatic tricuspid valve disorders: Secondary | ICD-10-CM | POA: Diagnosis not present

## 2018-05-17 DIAGNOSIS — F1120 Opioid dependence, uncomplicated: Secondary | ICD-10-CM

## 2018-05-17 DIAGNOSIS — M869 Osteomyelitis, unspecified: Secondary | ICD-10-CM | POA: Diagnosis not present

## 2018-05-17 DIAGNOSIS — B182 Chronic viral hepatitis C: Secondary | ICD-10-CM

## 2018-05-17 DIAGNOSIS — I079 Rheumatic tricuspid valve disease, unspecified: Secondary | ICD-10-CM

## 2018-05-17 DIAGNOSIS — I10 Essential (primary) hypertension: Secondary | ICD-10-CM | POA: Insufficient documentation

## 2018-05-17 DIAGNOSIS — B9561 Methicillin susceptible Staphylococcus aureus infection as the cause of diseases classified elsewhere: Secondary | ICD-10-CM

## 2018-05-17 NOTE — Progress Notes (Signed)
Subjective:    Patient ID: Tony Long, male    DOB: 04-05-1976, 42 y.o.   MRN: [id number]  Chief Complaint  Patient presents with  . Follow-up    HPI:  Tony Long is a 42 y.o. male who presents today for follow up on discitis and endocarditis.  Mr. Tony Long was admitted to the hospital on 03/20/2018 with worsening back pain and found to have MSSA bacteremia, tricuspid valve endocarditis, and epidural abscess involving C2 through his sacrum amongst numerous soft tissue abscesses that required drainage on 03/22/18 (Dr. Janee Morn). Neurosurgery was consulted and determined not to be a candidate for surgical intervention. He was initially started on Cefazolin and then broadened to vancomycin and ceftriaxone on 10/1/and then changed to Nafcillin. He received a total of 4 weeks of IV antibiotics and as of yesterday completed oral cephalexin TID to complete 8 weeks total.  He reports feeling much better and has no trouble with lingering back pain or lower extremity weakness following his treatment. He does still have 2 areas of non-healing wounds that his girlfriend is still packing 3x a day. There is some faint yellow/green drainage noted on the dressings noted per his report. He reports "numbness" at these areas and cannot feel the dressing changes. He is diabetic and keeps an eye on his blood sugars at home and reported to be in good range. No fevers/chils. He denies any chest pain, palpitations, fatigue or LE swelling. He is not taking any of his blood pressure medications as he is scared of getting low blood pressure based on what happened in the hospital setting.   No Known Allergies   Outpatient Medications Prior to Visit  Medication Sig Dispense Refill  . cyclobenzaprine (FLEXERIL) 5 MG tablet Take 1-2 tablets (5-10 mg total) by mouth 3 (three) times daily as needed for muscle spasms. 20 tablet 0  . etodolac (LODINE) 500 MG tablet Take 1 tablet (500 mg total) by mouth 2 (two) times  daily. 30 tablet 0  . gabapentin (NEURONTIN) 300 MG capsule Take 1 capsule (300 mg total) by mouth 3 (three) times daily. 90 capsule 6  . glucose blood test strip Use as instructed 100 each 12  . HYDROcodone-acetaminophen (NORCO) 5-325 MG tablet Take 1 tablet by mouth every 6 (six) hours as needed for moderate pain. 15 tablet 0  . ibuprofen (ADVIL,MOTRIN) 600 MG tablet Take 1 tablet (600 mg total) by mouth every 8 (eight) hours as needed. 30 tablet 0  . Insulin Glargine (LANTUS) 100 UNIT/ML Solostar Pen Inject 50 Units into the skin daily at 10 pm. (Patient taking differently: Inject 40 Units into the skin at bedtime. ) 45 mL 3  . insulin lispro (HUMALOG) 100 UNIT/ML injection Inject 0.1 mLs (10 Units total) into the skin 3 (three) times daily with meals. 60 mL 1  . Lancets (ONETOUCH ULTRASOFT) lancets Use as instructed 100 each 12  . LANTUS 100 UNIT/ML injection INJECT 40 UNITS TOTAL INTO THE SKIN AT BEDTIME. 10 mL 0  . lisinopril (PRINIVIL,ZESTRIL) 5 MG tablet Take 1 tablet (5 mg total) by mouth daily. 90 tablet 3  . metFORMIN (GLUCOPHAGE) 500 MG tablet Take 1 tablet (500 mg total) by mouth 2 (two) times daily with a meal. 180 tablet 3  . metoprolol tartrate (LOPRESSOR) 50 MG tablet Take 1 tablet (50 mg total) by mouth 2 (two) times daily. 60 tablet 0  . potassium chloride 20 MEQ TBCR Take 20 mEq by mouth daily. 30 tablet 0  . potassium  chloride SA (K-DUR,KLOR-CON) 20 MEQ tablet Take 1 tablet by mouth daily.  0  . pravastatin (PRAVACHOL) 40 MG tablet Take 1 tablet (40 mg total) by mouth daily. 90 tablet 3  . sildenafil (VIAGRA) 25 MG tablet Take 1 tablet (25 mg total) by mouth daily as needed for erectile dysfunction. 90 tablet 3  . tamsulosin (FLOMAX) 0.4 MG CAPS capsule Take 1 capsule (0.4 mg total) by mouth daily after supper. 30 capsule 0   No facility-administered medications prior to visit.     Past Medical History:  Diagnosis Date  . Diabetes mellitus without complication [facility])      Past Surgical History:  Procedure Laterality Date  . INCISION AND DRAINAGE ABSCESS N/A 03/22/2018   Procedure: INCISION AND DRAINAGE ABSCESS;  Surgeon: Violeta Gelinas, MD;  [facility];  Service: General;  Laterality: N/A;  . TEE WITHOUT CARDIOVERSION  03/22/2018   Procedure: TRANSESOPHAGEAL ECHOCARDIOGRAM (TEE);  Surgeon: Radiologist, Medication, MD;  Location: [facility] OR;  Service: Open Heart Surgery;;    Review of Systems  Constitutional: Negative for chills, diaphoresis, fatigue and fever.  Respiratory: Negative for cough, chest tightness, shortness of breath and wheezing.   Cardiovascular: Negative for chest pain.  Gastrointestinal: Negative for abdominal distention, abdominal pain, constipation, diarrhea, nausea and vomiting.  Musculoskeletal: Positive for back pain.  Neurological: Negative for weakness and headaches.  Hematological: Does not bruise/bleed easily.       Objective:    BP (!) 147/83   Pulse 85   Temp 98.6 F (37 C) (Oral)   Wt 150 lb (68 kg)   BMI 20.92 kg/m  Nursing note and vital signs reviewed.  Physical Exam  Constitutional: He is oriented to person, place, and time. He appears well-developed and well-nourished. No distress.  Seated in the chair; cane next to him; pleasant.   Cardiovascular: Normal rate, regular rhythm, normal heart sounds and intact distal pulses. Exam reveals no gallop and no friction rub.  No murmur heard. Pulmonary/Chest: Effort normal and breath sounds normal. No stridor. He has no wheezes. He has no rales. He exhibits no tenderness.  Neurological: He is alert and oriented to person, place, and time.  Skin: Skin is warm and dry.  Psychiatric: He has a normal mood and affect. His behavior is normal. Judgment and thought content normal.   Thoracic wound 1.9 x 0.8 cm. No drainage or odor. Pink wound bed with some granulation tissue      Lumbar wound 1.0 x 0.6 cm. Slight pale yellow material on dressing. Adherent slough? In the  wound bed. No probe to bone.        Assessment & Plan:   Problem List Items Addressed This Visit      Unprioritized   Back abscess    Non healing areas noted x 2. Wound bed of thoracic spine is clean, slick with some red granulation tissue noted in wound bed.  No drainage noted, no warmth. Some slight undermining around the wound in areas. Lumbar wound smaller with adherent slough in the wound bed. No probe to bone from my exam. Slight pale yellow odorless drainage to dressing packing.   I think his wounds are being packed a little too aggressively and too frequent - suggested to decrease to once a day and pack lightly and ensure moist not wet. Will refer to wound care clinic to help this heal up for him. He has follow up with Dr. Janee Morn this week per his report.  Chronic hepatitis C without hepatic coma (HCC)    Antibody (+), RNA (-). No further treatment or monitoring needed.       Endocarditis of tricuspid valve    No murmur appreciated on exam. No s/sx of heart failure today. Will arrange post-treatment echocardiogram to follow up. Referral to cardiology for ongoing management pending.       Relevant Orders   ECHOCARDIOGRAM COMPLETE   Hypertension    Vitals:   05/17/18 1152  BP: (!) 147/83  Pulse: 85  Temp: 98.6 F (37 C)   Blood pressure above target for diabetic patient. He is not taking lisinopril or metoprolol. I asked him to please check BP at home (he is able to do this) and keep a log before his upcoming PCP appointment. If consistently > 130/90 would recommend he get back on at least his lisinopril and discuss further with his PCP.       RESOLVED: MSSA bacteremia    Afebrile w/o symptoms of systemic infection. With no durable medical equipment/hardware/cardiac devices will not repeat blood cultures per IDSA guidelines and continue to watch off antibiotics. Precautions given.       Opioid use disorder, severe, dependence (HCC)    Under good control on  suboxone. This was "eye opening for him." I hope that this is a durable feeling for him and he continues to abstain. Congratulated on great work he is and will continue to do.       Osteomyelitis (HCC) - Primary    Back pain resolved. No tenderness over spinal processes. He has a history of significant abscess however is doing quite well after 8 weeks of therapy w/o pain or any compromise. MRI on 10/01 with improvement in abscess after appropriate treatment. Will see how he is doing in 6 weeks, may need follow up scan depending on how he continues to do.        Other Visit Diagnoses    Non-healing surgical wound, subsequent encounter       Relevant Orders   AMB referral to wound care center      Follow-up: Return in about 6 weeks (around 06/28/2018).  Rexene AlbertsStephanie Kearie Mennen, MSN, NP-C [facility]  [email or web address] Pager: [phone] Office: [phone]  [facility] Main #: [id number] [id number]

## 2018-05-17 NOTE — Assessment & Plan Note (Signed)
Non healing areas noted x 2. Wound bed of thoracic spine is clean, slick with some red granulation tissue noted in wound bed.  No drainage noted, no warmth. Some slight undermining around the wound in areas. Lumbar wound smaller with adherent slough in the wound bed. No probe to bone from my exam. Slight pale yellow odorless drainage to dressing packing.   I think his wounds are being packed a little too aggressively and too frequent - suggested to decrease to once a day and pack lightly and ensure moist not wet. Will refer to wound care clinic to help this heal up for him. He has follow up with Dr. Janee Mornhompson this week per his report.

## 2018-05-17 NOTE — Assessment & Plan Note (Signed)
Back pain resolved. No tenderness over spinal processes. He has a history of significant abscess however is doing quite well after 8 weeks of therapy w/o pain or any compromise. MRI on 10/01 with improvement in abscess after appropriate treatment. Will see how he is doing in 6 weeks, may need follow up scan depending on how he continues to do.

## 2018-05-17 NOTE — Assessment & Plan Note (Signed)
Afebrile w/o symptoms of systemic infection. With no durable medical equipment/hardware/cardiac devices will not repeat blood cultures per IDSA guidelines and continue to watch off antibiotics. Precautions given.

## 2018-05-17 NOTE — Patient Instructions (Signed)
Continue off antibiotics.   Will plan on labs at your next appointment in 6 weeks. Lots of water :)   I think you can decrease your dressing changes to every day but please discuss with your surgeon at your appointment Wednesday.   Will get you referred to a wound care clinic closer to you to help this heal up.   Will also repeat your echocardiogram (heart ultrasound) to make sure this looks OK after your treatment.

## 2018-05-17 NOTE — Assessment & Plan Note (Signed)
Under good control on suboxone. This was "eye opening for him." I hope that this is a durable feeling for him and he continues to abstain. Congratulated on great work he is and will continue to do.

## 2018-05-17 NOTE — Assessment & Plan Note (Signed)
Vitals:   05/17/18 1152  BP: (!) 147/83  Pulse: 85  Temp: 98.6 F (37 C)   Blood pressure above target for diabetic patient. He is not taking lisinopril or metoprolol. I asked him to please check BP at home (he is able to do this) and keep a log before his upcoming PCP appointment. If consistently > 130/90 would recommend he get back on at least his lisinopril and discuss further with his PCP.

## 2018-05-17 NOTE — Assessment & Plan Note (Addendum)
No murmur appreciated on exam. No s/sx of heart failure today. Will arrange post-treatment echocardiogram to follow up. Referral to cardiology for ongoing management pending.

## 2018-05-17 NOTE — Assessment & Plan Note (Signed)
Antibody (+), RNA (-). No further treatment or monitoring needed.

## 2018-05-21 DIAGNOSIS — R338 Other retention of urine: Secondary | ICD-10-CM | POA: Diagnosis not present

## 2018-05-26 ENCOUNTER — Ambulatory Visit: Payer: Self-pay | Admitting: Family Medicine

## 2018-05-31 ENCOUNTER — Encounter: Payer: Medicaid Other | Attending: Physician Assistant | Admitting: Physician Assistant

## 2018-05-31 DIAGNOSIS — F17200 Nicotine dependence, unspecified, uncomplicated: Secondary | ICD-10-CM | POA: Diagnosis not present

## 2018-05-31 DIAGNOSIS — I10 Essential (primary) hypertension: Secondary | ICD-10-CM | POA: Insufficient documentation

## 2018-05-31 DIAGNOSIS — L02212 Cutaneous abscess of back [any part, except buttock]: Secondary | ICD-10-CM | POA: Diagnosis not present

## 2018-05-31 DIAGNOSIS — F1120 Opioid dependence, uncomplicated: Secondary | ICD-10-CM | POA: Diagnosis not present

## 2018-05-31 DIAGNOSIS — B182 Chronic viral hepatitis C: Secondary | ICD-10-CM | POA: Diagnosis not present

## 2018-05-31 DIAGNOSIS — E11622 Type 2 diabetes mellitus with other skin ulcer: Secondary | ICD-10-CM | POA: Diagnosis present

## 2018-05-31 DIAGNOSIS — L98422 Non-pressure chronic ulcer of back with fat layer exposed: Secondary | ICD-10-CM | POA: Diagnosis not present

## 2018-05-31 DIAGNOSIS — T8189XA Other complications of procedures, not elsewhere classified, initial encounter: Secondary | ICD-10-CM | POA: Diagnosis not present

## 2018-06-03 DIAGNOSIS — R338 Other retention of urine: Secondary | ICD-10-CM | POA: Diagnosis not present

## 2018-06-05 NOTE — Progress Notes (Signed)
Tony Long, Hero ([id number]) Visit Report for 05/31/2018 Arrival Information Details Patient Name: Tony Long, Denym Date of Service: 05/31/2018 1:00 PM Medical Record Number: [id number] Patient Account Number: [id number] Date of Birth/Sex: 03/26/1976 (42 y.o. M) Treating RN: Rema JasmineNg, Wendi Primary Care Tremain Rucinski: Raliegh IpSTROUD, NATALIE Other Clinician: Referring Copper Basnett: Raliegh IpSTROUD, NATALIE Treating Elwin Tsou/Extender: Linwood DibblesSTONE III, HOYT Weeks in Treatment: 0 Visit Information Patient Arrived: Ambulatory Arrival Time: 13:13 Accompanied By: wife Transfer Assistance: None Patient Identification Verified: Yes Secondary Verification Process Completed: Yes Electronic Signature(s) Signed: 06/01/2018 4:17:15 PM By: Rema JasmineNg, Wendi Entered By: Rema JasmineNg, Wendi on 05/31/2018 13:14:01 Tony Long, Tony Long ([id number]) -------------------------------------------------------------------------------- Clinic Level of Care Assessment Details Patient Name: Tony Long, Tony Long Date of Service: 05/31/2018 1:00 PM Medical Record Number: [id number] Patient Account Number: [id number] Date of Birth/Sex: 03/20/1976 (42 y.o. M) Treating RN: Arnette NorrisBiell, Kristina Primary Care Manu Rubey: Raliegh IpSTROUD, NATALIE Other Clinician: Referring Tawan Corkern: Raliegh IpSTROUD, NATALIE Treating Almina Schul/Extender: Linwood DibblesSTONE III, HOYT Weeks in Treatment: 0 Clinic Level of Care Assessment Items TOOL 4 Quantity Score []  - Use when only an EandM is performed on FOLLOW-UP visit 0 ASSESSMENTS - Nursing Assessment / Reassessment X - Reassessment of Co-morbidities (includes updates in patient status) 1 10 X- 1 5 Reassessment of Adherence to Treatment Plan ASSESSMENTS - Wound and Skin Assessment / Reassessment []  - Simple Wound Assessment / Reassessment - one wound 0 X- 2 5 Complex Wound Assessment / Reassessment - multiple wounds []  - 0 Dermatologic / Skin Assessment (not related to wound area) ASSESSMENTS - Focused Assessment []  - Circumferential Edema Measurements - multi extremities 0 []   - 0 Nutritional Assessment / Counseling / Intervention []  - 0 Lower Extremity Assessment (monofilament, tuning fork, pulses) []  - 0 Peripheral Arterial Disease Assessment (using hand held doppler) ASSESSMENTS - Ostomy and/or Continence Assessment and Care []  - Incontinence Assessment and Management 0 []  - 0 Ostomy Care Assessment and Management (repouching, etc.) PROCESS - Coordination of Care X - Simple Patient / Family Education for ongoing care 1 15 []  - 0 Complex (extensive) Patient / Family Education for ongoing care X- 1 10 Staff obtains ChiropractorConsents, Records, Test Results / Process Orders []  - 0 Staff telephones HHA, Nursing Homes / Clarify orders / etc []  - 0 Routine Transfer to another Facility (non-emergent condition) []  - 0 Routine Hospital Admission (non-emergent condition) []  - 0 New Admissions / Manufacturing engineernsurance Authorizations / Ordering NPWT, Apligraf, etc. []  - 0 Emergency Hospital Admission (emergent condition) X- 1 10 Simple Discharge Coordination Tony Long, Abdalla ([id number]) []  - 0 Complex (extensive) Discharge Coordination PROCESS - Special Needs []  - Pediatric / Minor Patient Management 0 []  - 0 Isolation Patient Management []  - 0 Hearing / Language / Visual special needs []  - 0 Assessment of Community assistance (transportation, D/C planning, etc.) []  - 0 Additional assistance / Altered mentation []  - 0 Support Surface(s) Assessment (bed, cushion, seat, etc.) INTERVENTIONS - Wound Cleansing / Measurement []  - Simple Wound Cleansing - one wound 0 X- 2 5 Complex Wound Cleansing - multiple wounds X- 1 5 Wound Imaging (photographs - any number of wounds) []  - 0 Wound Tracing (instead of photographs) []  - 0 Simple Wound Measurement - one wound X- 2 5 Complex Wound Measurement - multiple wounds INTERVENTIONS - Wound Dressings []  - Small Wound Dressing one or multiple wounds 0 X- 2 15 Medium Wound Dressing one or multiple wounds []  - 0 Large Wound  Dressing one or multiple wounds []  - 0 Application of Medications - topical []  - 0 Application of Medications - injection INTERVENTIONS - Miscellaneous []  -  External ear exam 0 []  - 0 Specimen Collection (cultures, biopsies, blood, body fluids, etc.) []  - 0 Specimen(s) / Culture(s) sent or taken to Lab for analysis []  - 0 Patient Transfer (multiple staff / Michiel Sites Lift / Similar devices) []  - 0 Simple Staple / Suture removal (25 or less) []  - 0 Complex Staple / Suture removal (26 or more) []  - 0 Hypo / Hyperglycemic Management (close monitor of Blood Glucose) []  - 0 Ankle / Brachial Index (ABI) - do not check if billed separately X- 1 5 Vital Signs LEMOND, GRIFFEE ([id number]) Has the patient been seen at the hospital within the last three years: Yes Total Score: 120 Level Of Care: New/Established - Level 4 Electronic Signature(s) Signed: 06/01/2018 5:26:41 PM By: Arnette Norris Entered By: Arnette Norris on 05/31/2018 14:20:07 Tony Long ([id number]) -------------------------------------------------------------------------------- Encounter Discharge Information Details Patient Name: Tony Long Date of Service: 05/31/2018 1:00 PM Medical Record Number: [id number] Patient Account Number: [id number] Date of Birth/Sex: October 02, 1975 (42 y.o. M) Treating RN: Curtis Sites Primary Care Ainslee Sou: Raliegh Ip Other Clinician: Referring Kenn Rekowski: Raliegh Ip Treating Maryana Pittmon/Extender: Linwood Dibbles, HOYT Weeks in Treatment: 0 Encounter Discharge Information Items Discharge Condition: Stable Ambulatory Status: Ambulatory Discharge Destination: Home Transportation: Private Auto Accompanied By: girlfriend Schedule Follow-up Appointment: Yes Clinical Summary of Care: Electronic Signature(s) Signed: 05/31/2018 2:47:14 PM By: Curtis Sites Entered By: Curtis Sites on 05/31/2018 14:47:14 Tony Long  ([id number]) -------------------------------------------------------------------------------- Lower Extremity Assessment Details Patient Name: Tony Long Date of Service: 05/31/2018 1:00 PM Medical Record Number: [id number] Patient Account Number: [id number] Date of Birth/Sex: 04-22-1976 (42 y.o. M) Treating RN: Curtis Sites Primary Care Anglia Blakley: Raliegh Ip Other Clinician: Referring Casidy Alberta: Raliegh Ip Treating Aniayah Alaniz/Extender: Linwood Dibbles, HOYT Weeks in Treatment: 0 Electronic Signature(s) Signed: 05/31/2018 2:46:23 PM By: Curtis Sites Entered By: Curtis Sites on 05/31/2018 14:46:22 Tony Long ([id number]) -------------------------------------------------------------------------------- Multi Wound Chart Details Patient Name: Tony Long Date of Service: 05/31/2018 1:00 PM Medical Record Number: [id number] Patient Account Number: [id number] Date of Birth/Sex: 1975/12/26 (43 y.o. M) Treating RN: Arnette Norris Primary Care Vidya Bamford: Raliegh Ip Other Clinician: Referring Tae Robak: Raliegh Ip Treating Marigold Mom/Extender: STONE III, HOYT Weeks in Treatment: 0 Vital Signs Height(in): 71 Pulse(bpm): 93 Weight(lbs): 155 Blood Pressure(mmHg): 130/83 Body Mass Index(BMI): 22 Temperature(F): 98.2 Respiratory Rate 16 (breaths/min): Photos: [N/A:N/A] Wound Location: Back - Midline, Proximal Back - Midline, Distal N/A Wounding Event: Surgical Injury Surgical Injury N/A Primary Etiology: Abscess Abscess N/A Comorbid History: Type II Diabetes Type II Diabetes N/A Date Acquired: 03/21/2018 03/21/2018 N/A Weeks of Treatment: 0 0 N/A Wound Status: Open Open N/A Measurements L x W x D 1.8x0.8x0.7 0.9x0.4x1.1 N/A (cm) Area (cm) : 1.131 0.283 N/A Volume (cm) : 0.792 0.311 N/A Starting Position 1 7 7  (o'clock): Ending Position 1 5 5  (o'clock): Maximum Distance 1 (cm): 1.7 1.4 Undermining: Yes Yes N/A Classification: Full Thickness Without  Full Thickness Without N/A Exposed Support Structures Exposed Support Structures Exudate Amount: Medium Medium N/A Exudate Type: Serous Serous N/A Exudate Color: amber amber N/A Wound Margin: Flat and Intact Flat and Intact N/A Granulation Amount: Large (67-100%) Large (67-100%) N/A Granulation Quality: Red Red N/A Necrotic Amount: Small (1-33%) Small (1-33%) N/A Exposed Structures: Fat Layer (Subcutaneous Fat Layer (Subcutaneous N/A Tissue) Exposed: Yes Tissue) Exposed: Yes Fascia: No Fascia: No KEATH, MATERA ([id number]) Tendon: No Tendon: No Muscle: No Muscle: No Joint: No Joint: No Bone: No Bone: No Epithelialization: None None N/A Periwound Skin Texture: Scarring: Yes Scarring: Yes N/A Excoriation: No Excoriation: No Induration: No Induration: No  Callus: No Callus: No Crepitus: No Crepitus: No Rash: No Rash: No Periwound Skin Moisture: Maceration: No Maceration: No N/A Dry/Scaly: No Dry/Scaly: No Periwound Skin Color: Atrophie Blanche: No Atrophie Blanche: No N/A Cyanosis: No Cyanosis: No Ecchymosis: No Ecchymosis: No Erythema: No Erythema: No Hemosiderin Staining: No Hemosiderin Staining: No Mottled: No Mottled: No Pallor: No Pallor: No Rubor: No Rubor: No Temperature: No Abnormality No Abnormality N/A Tenderness on Palpation: Yes Yes N/A Wound Preparation: Ulcer Cleansing: Ulcer Cleansing: N/A Rinsed/Irrigated with Saline Rinsed/Irrigated with Saline Topical Anesthetic Applied: Topical Anesthetic Applied: Other: lidocaine 4% Other: lidocaine 4% Treatment Notes Electronic Signature(s) Signed: 06/01/2018 5:26:41 PM By: Arnette Norris Entered By: Arnette Norris on 05/31/2018 13:45:00 Tony Long ([id number]) -------------------------------------------------------------------------------- Multi-Disciplinary Care Plan Details Patient Name: Tony Long Date of Service: 05/31/2018 1:00 PM Medical Record Number: [id number] Patient  Account Number: [id number] Date of Birth/Sex: [birth year] (42 y.o. M) Treating RN: Arnette Norris Primary Care Akeela Busk: Raliegh Ip Other Clinician: Referring Harace Mccluney: Raliegh Ip Treating Elijan Googe/Extender: Linwood Dibbles, HOYT Weeks in Treatment: 0 Active Inactive Wound/Skin Impairment Nursing Diagnoses: Impaired tissue integrity Knowledge deficit related to ulceration/compromised skin integrity Goals: Ulcer/skin breakdown will have a volume reduction of 30% by week 4 Date Initiated: 05/31/2018 Target Resolution Date: 07/01/2018 Goal Status: Active Interventions: Assess patient/caregiver ability to obtain necessary supplies Assess patient/caregiver ability to perform ulcer/skin care regimen upon admission and as needed Assess ulceration(s) every visit Notes: Electronic Signature(s) Signed: 06/01/2018 5:26:41 PM By: Arnette Norris Entered By: Arnette Norris on 05/31/2018 13:44:39 Tony Long ([id number]) -------------------------------------------------------------------------------- Pain Assessment Details Patient Name: Tony Long Date of Service: 05/31/2018 1:00 PM Medical Record Number: [id number] Patient Account Number: [id number] Date of Birth/Sex: 1975-12-25 (42 y.o. M) Treating RN: Rema Jasmine Primary Care Katiya Fike: Raliegh Ip Other Clinician: Referring Nicolaus Andel: Raliegh Ip Treating Cece Milhouse/Extender: Linwood Dibbles, HOYT Weeks in Treatment: 0 Active Problems Location of Pain Severity and Description of Pain Patient Has Paino No Site Locations Pain Management and Medication Current Pain Management: Goals for Pain Management pt denies any pain at this time. Electronic Signature(s) Signed: 06/01/2018 4:17:15 PM By: Rema Jasmine Entered By: Rema Jasmine on 05/31/2018 13:14:34 Tony Long ([id number]) -------------------------------------------------------------------------------- Patient/Caregiver Education Details Patient Name: Tony Long Date  of Service: 05/31/2018 1:00 PM Medical Record Number: [id number] Patient Account Number: [id number] Date of Birth/Gender: 05-11-1976 (42 y.o. M) Treating RN: Arnette Norris Primary Care Physician: Raliegh Ip Other Clinician: Referring Physician: Raliegh Ip Treating Physician/Extender: Skeet Simmer in Treatment: 0 Education Assessment Education Provided To: Patient Education Topics Provided Wound/Skin Impairment: Handouts: Caring for Your Ulcer Methods: Demonstration, Explain/Verbal Responses: State content correctly Electronic Signature(s) Signed: 06/01/2018 5:26:41 PM By: Arnette Norris Entered By: Arnette Norris on 05/31/2018 14:20:30 Tony Long ([id number]) -------------------------------------------------------------------------------- Wound Assessment Details Patient Name: Tony Long Date of Service: 05/31/2018 1:00 PM Medical Record Number: [id number] Patient Account Number: [id number] Date of Birth/Sex: [birth year] (42 y.o. M) Treating RN: Curtis Sites Primary Care Avaiah Stempel: Raliegh Ip Other Clinician: Referring Margaret Cockerill: Raliegh Ip Treating Keante Urizar/Extender: STONE III, HOYT Weeks in Treatment: 0 Wound Status Wound Number: 1 Primary Etiology: Abscess Wound Location: Back - Midline, Proximal Wound Status: Open Wounding Event: Surgical Injury Comorbid History: Type II Diabetes Date Acquired: 03/21/2018 Weeks Of Treatment: 0 Clustered Wound: No Photos Wound Measurements Length: (cm) 1.8 % Reducti Width: (cm) 0.8 % Reducti Depth: (cm) 0.7 Epithelia Area: (cm) 1.131 Tunnelin Volume: (cm) 0.792 Posit Maximu on in Area: 0% on in Volume: 0% lization: None g: Yes ion (o'clock): 12 m Distance: (cm) 10.8 Undermining: Yes Starting  Position (o'clock): 7 Ending Position (o'clock): 5 Maximum Distance: (cm) 1.7 Wound Description Full Thickness Without Exposed Support Foul Odor Classification: Structures Slough/Fi Wound  Margin: Flat and Intact Exudate Medium Amount: Exudate Type: Serous Exudate Color: amber After Cleansing: No brino Yes Wound Bed Granulation Amount: Large (67-100%) Exposed Structure BUNYAN, BRIER ([id number]) Granulation Quality: Red Fascia Exposed: No Necrotic Amount: Small (1-33%) Fat Layer (Subcutaneous Tissue) Exposed: Yes Necrotic Quality: Adherent Slough Tendon Exposed: No Muscle Exposed: No Joint Exposed: No Bone Exposed: No Periwound Skin Texture Texture Color No Abnormalities Noted: No No Abnormalities Noted: No Callus: No Atrophie Blanche: No Crepitus: No Cyanosis: No Excoriation: No Ecchymosis: No Induration: No Erythema: No Rash: No Hemosiderin Staining: No Scarring: Yes Mottled: No Pallor: No Moisture Rubor: No No Abnormalities Noted: No Dry / Scaly: No Temperature / Pain Maceration: No Temperature: No Abnormality Tenderness on Palpation: Yes Wound Preparation Ulcer Cleansing: Rinsed/Irrigated with Saline Topical Anesthetic Applied: Other: lidocaine 4%, Treatment Notes Wound #1 (Proximal, Midline Back) Notes iodoform packing and bordered foam dressing Electronic Signature(s) Signed: 05/31/2018 5:00:01 PM By: Curtis Sites Signed: 06/01/2018 5:26:41 PM By: Arnette Norris Previous Signature: 05/31/2018 1:27:41 PM Version By: Curtis Sites Entered By: Arnette Norris on 05/31/2018 14:18:04 Tony Long ([id number]) -------------------------------------------------------------------------------- Wound Assessment Details Patient Name: Tony Long Date of Service: 05/31/2018 1:00 PM Medical Record Number: [id number] Patient Account Number: [id number] Date of Birth/Sex: [birth year] (42 y.o. M) Treating RN: Curtis Sites Primary Care Linville Decarolis: Raliegh Ip Other Clinician: Referring Tyleigh Mahn: Raliegh Ip Treating Rozanna Cormany/Extender: STONE III, HOYT Weeks in Treatment: 0 Wound Status Wound Number: 2 Primary Etiology:  Abscess Wound Location: Back - Midline, Distal Wound Status: Open Wounding Event: Surgical Injury Comorbid History: Type II Diabetes Date Acquired: 03/21/2018 Weeks Of Treatment: 0 Clustered Wound: No Photos Photo Uploaded By: Curtis Sites on 05/31/2018 13:29:38 Wound Measurements Length: (cm) 0.9 % Redu Width: (cm) 0.4 % Redu Depth: (cm) 1.1 Epithe Area: (cm) 0.283 Tunne Volume: (cm) 0.311 Under Sta End Max ction in Area: ction in Volume: lialization: None ling: No mining: Yes rting Position (o'clock): 7 ing Position (o'clock): 5 imum Distance: (cm) 1.4 Wound Description Full Thickness Without Exposed Support Foul O Classification: Structures Slough Wound Margin: Flat and Intact Exudate Medium Amount: Exudate Type: Serous Exudate Color: amber dor After Cleansing: No /Fibrino Yes Wound Bed Granulation Amount: Large (67-100%) Exposed Structure Granulation Quality: Red Fascia Exposed: No Necrotic Amount: Small (1-33%) Fat Layer (Subcutaneous Tissue) Exposed: Yes LIRON, EISSLER ([id number]) Necrotic Quality: Adherent Slough Tendon Exposed: No Muscle Exposed: No Joint Exposed: No Bone Exposed: No Periwound Skin Texture Texture Color No Abnormalities Noted: No No Abnormalities Noted: No Callus: No Atrophie Blanche: No Crepitus: No Cyanosis: No Excoriation: No Ecchymosis: No Induration: No Erythema: No Rash: No Hemosiderin Staining: No Scarring: Yes Mottled: No Pallor: No Moisture Rubor: No No Abnormalities Noted: No Dry / Scaly: No Temperature / Pain Maceration: No Temperature: No Abnormality Tenderness on Palpation: Yes Wound Preparation Ulcer Cleansing: Rinsed/Irrigated with Saline Topical Anesthetic Applied: Other: lidocaine 4%, Treatment Notes Wound #2 (Distal, Midline Back) Notes iodoform packing and bordered foam dressing Electronic Signature(s) Signed: 05/31/2018 1:28:58 PM By: Curtis Sites Entered By: Curtis Sites on  05/31/2018 13:28:58 Tony Long ([id number]) -------------------------------------------------------------------------------- Vitals Details Patient Name: Tony Long Date of Service: 05/31/2018 1:00 PM Medical Record Number: [id number] Patient Account Number: [id number] Date of Birth/Sex: 1975/08/14 (42 y.o. M) Treating RN: Rema Jasmine Primary Care Francely Craw: Raliegh Ip Other Clinician: Referring Kyi Romanello: Raliegh Ip Treating Yarexi Pawlicki/Extender: STONE III, HOYT Weeks in Treatment: 0  Vital Signs Time Taken: 13:14 Temperature (F): 98.2 Height (in): 71 Pulse (bpm): 93 Source: Stated Respiratory Rate (breaths/min): 16 Weight (lbs): 155 Blood Pressure (mmHg): 130/83 Source: Stated Reference Range: 80 - 120 mg / dl Body Mass Index (BMI): 21.6 Electronic Signature(s) Signed: 06/01/2018 4:17:15 PM By: Rema Jasmine Entered By: Rema Jasmine on 05/31/2018 13:15:30

## 2018-06-05 NOTE — Progress Notes (Signed)
Tony Long, Tony Long ([id number]) Visit Report for 05/31/2018 Chief Complaint Document Details Patient Name: Tony Long, Tony Long Date of Service: 05/31/2018 1:00 PM Medical Record Number: [id number] Patient Account Number: [id number] Date of Birth/Sex: [birth year] (42 y.o. M) Treating RN: Arnette Norris Primary Care Provider: Raliegh Ip Other Clinician: Referring Provider: Raliegh Ip Treating Provider/Extender: Linwood Dibbles, HOYT Weeks in Treatment: 0 Information Obtained from: Patient Chief Complaint Thoracic spine abscess with lumbar spinal region ulcerations Electronic Signature(s) Signed: 06/03/2018 1:26:35 AM By: Lenda Kelp PA-C Entered By: Lenda Kelp on 06/01/2018 09:04:30 Tony Long ([id number]) -------------------------------------------------------------------------------- HPI Details Patient Name: Tony Long Date of Service: 05/31/2018 1:00 PM Medical Record Number: [id number] Patient Account Number: [id number] Date of Birth/Sex: Oct 14, 1975 (41 y.o. M) Treating RN: Arnette Norris Primary Care Provider: Raliegh Ip Other Clinician: Referring Provider: Raliegh Ip Treating Provider/Extender: Linwood Dibbles, HOYT Weeks in Treatment: 0 History of Present Illness Associated Signs and Symptoms: Patient does have a history of chronic viral hepatitis see, hypertension, and opioid dependence. HPI Description: 05/31/18 on evaluation today patient presents for initial evaluation or office for two issues that he's been having with openings in the lumbar spine extending into the lower thoracic region. Subsequently it appears that as best I can tell from his notes he likely had an epidural abscess which subsequently led to these open areas. He was seen by infectious disease, Rexene Alberts, who referred him to [place] for management of the wounds. Apparently he has completed his course of IV antibiotic therapy at this point. Patient did have a technically limited  examination of the cervical and thoracic spine by way of it and MRI. Unfortunately he was not able to tolerate the full length of the exam therefore only sagittal sections were obtained of the thoracic spine region. Fortunately there is no imaging findings to suggest spinal cord infarction. There was interval improvement in the epidural collection (epidural abscess) on this examination when compared to the prior examination which was 03/22/18. The date of this MRI was 03/30/18. Again there was no imaging performed of the lumbar spine at this time. No new disc guidance or facet arthritis noted. There is no evidence of osteomyelitis. Currently the patient does not have any significant pain which is good news. With that being said he is having some discomfort at this point mainly with packing of the wounds although I think packing is going to continue to be the treatment of choice. No fevers, chills, nausea, or vomiting noted at this time. Electronic Signature(s) Signed: 06/03/2018 1:26:35 AM By: Lenda Kelp PA-C Entered By: Lenda Kelp on 06/03/2018 01:24:04 Tony Long ([id number]) -------------------------------------------------------------------------------- Physical Exam Details Patient Name: Tony Long Date of Service: 05/31/2018 1:00 PM Medical Record Number: [id number] Patient Account Number: [id number] Date of Birth/Sex: [birth year] (42 y.o. M) Treating RN: Arnette Norris Primary Care Provider: Raliegh Ip Other Clinician: Referring Provider: Raliegh Ip Treating Provider/Extender: STONE III, HOYT Weeks in Treatment: 0 Constitutional patient is hypertensive.. pulse regular and within target range for patient.Marland Kitchen respirations regular, non-labored and within target range for patient.Marland Kitchen temperature within target range for patient.. Well-nourished and well-hydrated in no acute distress. Eyes conjunctiva clear no eyelid edema noted. pupils equal round and reactive to  light and accommodation. Ears, Nose, Mouth, and Throat no gross abnormality of ear auricles or external auditory canals. normal hearing noted during conversation. mucus membranes moist. Respiratory normal breathing without difficulty. clear to auscultation bilaterally. Cardiovascular regular rate and rhythm with normal S1, S2. no clubbing, cyanosis, significant edema, <3 sec cap refill.  Gastrointestinal (GI) soft, non-tender, non-distended, +BS. no ventral hernia noted. Musculoskeletal normal gait and posture. full range of motion without deformity. Psychiatric this patient is able to make decisions and demonstrates good insight into disease process. Alert and Oriented x 3. pleasant and cooperative. Notes Upon inspection the patient had to wounds noted in the lumbar region one was more toward the caudal region and the other was more in the cephalad region of the lumbar spine with the undermining extending into the thoracic spine region. This was definitely much greater as far as the amount of undermining noted at this time. Fortunately there did not appear to be evidence of significant infection at this time. The undermining was noted at both wound sites although the patient did have a significant tunnel at 12 o'clock in regard to the cephalad wound. Electronic Signature(s) Signed: 06/03/2018 1:26:35 AM By: Lenda Kelp PA-C Entered By: Lenda Kelp on 06/03/2018 01:25:46 Tony Long ([id number]) -------------------------------------------------------------------------------- Physician Orders Details Patient Name: Tony Long Date of Service: 05/31/2018 1:00 PM Medical Record Number: [id number] Patient Account Number: [id number] Date of Birth/Sex: [birth year] (41 y.o. M) Treating RN: Arnette Norris Primary Care Provider: Raliegh Ip Other Clinician: Referring Provider: Raliegh Ip Treating Provider/Extender: Linwood Dibbles, HOYT Weeks in Treatment: 0 Verbal / Phone  Orders: No Diagnosis Coding ICD-10 Coding Code Description E11.622 Type 2 diabetes mellitus with other skin ulcer L02.212 Cutaneous abscess of back [any part, except buttock] F11.20 Opioid dependence, uncomplicated I10 Essential (primary) hypertension B18.2 Chronic viral hepatitis C Wound Cleansing Wound #1 Proximal,Midline Back o Clean wound with Normal Saline. o May Shower, gently pat wound dry prior to applying new dressing. Wound #2 Distal,Midline Back o Clean wound with Normal Saline. o May Shower, gently pat wound dry prior to applying new dressing. Anesthetic (add to Medication List) Wound #1 Proximal,Midline Back o Topical Lidocaine 4% cream applied to wound bed prior to debridement (In Clinic Only). Wound #2 Distal,Midline Back o Topical Lidocaine 4% cream applied to wound bed prior to debridement (In Clinic Only). Primary Wound Dressing Wound #1 Proximal,Midline Back o Iodoform packing Gauze - Change daily Wound #2 Distal,Midline Back o Iodoform packing Gauze - Change daily Secondary Dressing Wound #1 Proximal,Midline Back o ABD pad - Change daily or as needed for excessive drainage Wound #2 Distal,Midline Back o ABD pad - Change daily or as needed for excessive drainage Dressing Change Frequency Wound #1 Proximal,Midline Back o Change dressing every day. Tony Long, Tony Long ([id number]) Wound #2 Distal,Midline Back o Change dressing every day. Follow-up Appointments Wound #1 Proximal,Midline Back o Return Appointment in 1 week. Wound #2 Distal,Midline Back o Return Appointment in 1 week. Electronic Signature(s) Signed: 06/01/2018 5:26:41 PM By: Arnette Norris Signed: 06/03/2018 1:26:35 AM By: Lenda Kelp PA-C Entered By: Arnette Norris on 05/31/2018 14:17:03 Tony Long ([id number]) -------------------------------------------------------------------------------- Problem List Details Patient Name: Tony Long Date of  Service: 05/31/2018 1:00 PM Medical Record Number: [id number] Patient Account Number: [id number] Date of Birth/Sex: [birth year] (42 y.o. M) Treating RN: Arnette Norris Primary Care Provider: Raliegh Ip Other Clinician: Referring Provider: Raliegh Ip Treating Provider/Extender: Linwood Dibbles, HOYT Weeks in Treatment: 0 Active Problems ICD-10 Evaluated Encounter Code Description Active Date Today Diagnosis E11.622 Type 2 diabetes mellitus with other skin ulcer 05/31/2018 No Yes L02.212 Cutaneous abscess of back [any part, except buttock] 05/31/2018 No Yes F11.20 Opioid dependence, uncomplicated 05/31/2018 No Yes I10 Essential (primary) hypertension 05/31/2018 No Yes B18.2 Chronic viral hepatitis C 05/31/2018 No Yes Inactive Problems Resolved Problems Electronic Signature(s) Signed: 06/03/2018  1:26:35 AM By: Lenda Kelp PA-C Entered By: Lenda Kelp on 05/31/2018 13:51:02 Tony Long ([id number]) -------------------------------------------------------------------------------- Progress Note Details Patient Name: Tony Long Date of Service: 05/31/2018 1:00 PM Medical Record Number: [id number] Patient Account Number: [id number] Date of Birth/Sex: February 20, 1976 (42 y.o. M) Treating RN: Arnette Norris Primary Care Provider: Raliegh Ip Other Clinician: Referring Provider: Raliegh Ip Treating Provider/Extender: Linwood Dibbles, HOYT Weeks in Treatment: 0 Subjective Chief Complaint Information obtained from Patient Thoracic spine abscess with lumbar spinal region ulcerations History of Present Illness (HPI) The following HPI elements were documented for the patient's wound: Associated Signs and Symptoms: Patient does have a history of chronic viral hepatitis see, hypertension, and opioid dependence. 05/31/18 on evaluation today patient presents for initial evaluation or office for two issues that he's been having with openings in the lumbar spine extending into the  lower thoracic region. Subsequently it appears that as best I can tell from his notes he likely had an epidural abscess which subsequently led to these open areas. He was seen by infectious disease, Rexene Alberts, who referred him to [place] for management of the wounds. Apparently he has completed his course of IV antibiotic therapy at this point. Patient did have a technically limited examination of the cervical and thoracic spine by way of it and MRI. Unfortunately he was not able to tolerate the full length of the exam therefore only sagittal sections were obtained of the thoracic spine region. Fortunately there is no imaging findings to suggest spinal cord infarction. There was interval improvement in the epidural collection (epidural abscess) on this examination when compared to the prior examination which was 03/22/18. The date of this MRI was 03/30/18. Again there was no imaging performed of the lumbar spine at this time. No new disc guidance or facet arthritis noted. There is no evidence of osteomyelitis. Currently the patient does not have any significant pain which is good news. With that being said he is having some discomfort at this point mainly with packing of the wounds although I think packing is going to continue to be the treatment of choice. No fevers, chills, nausea, or vomiting noted at this time. Wound History Patient presents with 2 open wounds that have been present for approximately 3 months. Patient has been treating wounds in the following manner: wet gauze and bandage. Laboratory tests have not been performed in the last month. Patient reportedly has not tested positive for an antibiotic resistant organism. Patient reportedly has tested positive for osteomyelitis. Patient reportedly has not had testing performed to evaluate circulation in the legs. Patient History Information obtained from Patient. Family History Cancer - Maternal Grandparents, Diabetes - Father, Heart  Disease - Father, No family history of Hereditary Spherocytosis, Hypertension, Kidney Disease, Lung Disease, Seizures, Stroke, Thyroid Problems, Tuberculosis. Social History Current every day smoker, Marital Status - Single, Alcohol Use - Moderate, Drug Use - Prior History, Caffeine Use - Daily. Medical History Eyes Denies history of Cataracts, Glaucoma Ear/Nose/Mouth/Throat Denies history of Chronic sinus problems/congestion, Middle ear problems IBAN, UTZ ([id number]) Hematologic/Lymphatic Denies history of Anemia, Hemophilia, Human Immunodeficiency Virus, Lymphedema, Sickle Cell Disease Respiratory Denies history of Aspiration, Asthma, Chronic Obstructive Pulmonary Disease (COPD), Pneumothorax, Sleep Apnea, Tuberculosis Cardiovascular Denies history of Angina, Arrhythmia, Congestive Heart Failure, Coronary Artery Disease, Deep Vein Thrombosis, Hypertension, Hypotension, Myocardial Infarction, Peripheral Arterial Disease, Peripheral Venous Disease, Phlebitis, Vasculitis Gastrointestinal Denies history of Cirrhosis , Colitis, Crohn s, Hepatitis A, Hepatitis B, Hepatitis C Endocrine Patient has history of Type II Diabetes -  7 years Denies history of Type I Diabetes Genitourinary Denies history of End Stage Renal Disease Immunological Denies history of Lupus Erythematosus, Raynaud s, Scleroderma Integumentary (Skin) Denies history of History of Burn, History of pressure wounds Musculoskeletal Denies history of Gout, Rheumatoid Arthritis, Osteoarthritis, Osteomyelitis Neurologic Denies history of Dementia, Neuropathy, Quadriplegia, Paraplegia, Seizure Disorder Oncologic Denies history of Received Chemotherapy, Received Radiation [name] treated with Insulin. Review of Systems (ROS) Eyes Denies complaints or symptoms of Dry Eyes, Vision Changes, Glasses / Contacts. Ear/Nose/Mouth/Throat Denies complaints or symptoms of Difficult clearing ears,  Sinusitis. Hematologic/Lymphatic Denies complaints or symptoms of Bleeding / Clotting Disorders, Human Immunodeficiency Virus. Respiratory Denies complaints or symptoms of Chronic or frequent coughs, Shortness of Breath. Cardiovascular Denies complaints or symptoms of Chest pain, LE edema. Gastrointestinal Denies complaints or symptoms of Frequent diarrhea, Nausea, Vomiting. Endocrine Denies complaints or symptoms of Hepatitis, Thyroid disease, Polydypsia (Excessive Thirst). Genitourinary Denies complaints or symptoms of Kidney failure/ Dialysis, Incontinence/dribbling. Immunological Denies complaints or symptoms of Hives, Itching. Integumentary (Skin) Denies complaints or symptoms of Wounds, Bleeding or bruising tendency, Breakdown, Swelling. Musculoskeletal Denies complaints or symptoms of Muscle Pain, Muscle Weakness. Neurologic Denies complaints or symptoms of Numbness/parasthesias. Oncologic The patient has no complaints or symptoms. Tony Long, Tony Long ([id number]) Objective Constitutional patient is hypertensive.. pulse regular and within target range for patient.Marland Kitchen respirations regular, non-labored and within target range for patient.Marland Kitchen temperature within target range for patient.. Well-nourished and well-hydrated in no acute distress. Vitals Time Taken: 1:14 PM, Height: 71 in, Source: Stated, Weight: 155 lbs, Source: Stated, BMI: 21.6, Temperature: 98.2 F, Pulse: 93 bpm, Respiratory Rate: 16 breaths/min, Blood Pressure: 130/83 mmHg. Eyes conjunctiva clear no eyelid edema noted. pupils equal round and reactive to light and accommodation. Ears, Nose, Mouth, and Throat no gross abnormality of ear auricles or external auditory canals. normal hearing noted during conversation. mucus membranes moist. Respiratory normal breathing without difficulty. clear to auscultation bilaterally. Cardiovascular regular rate and rhythm with normal S1, S2. no clubbing, cyanosis, significant  edema, Gastrointestinal (GI) soft, non-tender, non-distended, +BS. no ventral hernia noted. Musculoskeletal normal gait and posture. full range of motion without deformity. Psychiatric this patient is able to make decisions and demonstrates good insight into disease process. Alert and Oriented x 3. pleasant and cooperative. General Notes: Upon inspection the patient had to wounds noted in the lumbar region one was more toward the caudal region and the other was more in the cephalad region of the lumbar spine with the undermining extending into the thoracic spine region. This was definitely much greater as far as the amount of undermining noted at this time. Fortunately there did not appear to be evidence of significant infection at this time. The undermining was noted at both wound sites although the patient did have a significant tunnel at 12 o'clock in regard to the cephalad wound. Integumentary (Hair, Skin) Wound #1 status is Open. Original cause of wound was Surgical Injury. The wound is located on the Proximal,Midline Back. The wound measures 1.8cm length x 0.8cm width x 0.7cm depth; 1.131cm^2 area and 0.792cm^3 volume. There is Fat Layer (Subcutaneous Tissue) Exposed exposed. Tunneling has been noted at 12:00 with a maximum distance of 10.8cm. Undermining begins at 7:00 and ends at 5:00 with a maximum distance of 1.7cm. There is a medium amount of serous drainage noted. The wound margin is flat and intact. There is large (67-100%) red granulation within the wound bed. There is a small (1-33%) amount of necrotic tissue within the wound bed including Adherent  Slough. The periwound skin appearance exhibited: Scarring. The periwound skin appearance did not exhibit: Callus, Crepitus, Excoriation, Induration, Rash, Dry/Scaly, Maceration, Atrophie Blanche, Cyanosis, Ecchymosis, Hemosiderin Staining, Mottled, Pallor, Rubor, Erythema. Periwound temperature was noted as No Abnormality. The  periwound has tenderness on palpation. Wound #2 status is Open. Original cause of wound was Surgical Injury. The wound is located on the Distal,Midline Back. The wound measures 0.9cm length x 0.4cm width x 1.1cm depth; 0.283cm^2 area and 0.311cm^3 volume. There is Fat Layer (Subcutaneous Tissue) Exposed exposed. There is no tunneling noted, however, there is undermining starting at 7:00 and Tony Long, Tony Long ([id number]) ending at 5:00 with a maximum distance of 1.4cm. There is a medium amount of serous drainage noted. The wound margin is flat and intact. There is large (67-100%) red granulation within the wound bed. There is a small (1-33%) amount of necrotic tissue within the wound bed including Adherent Slough. The periwound skin appearance exhibited: Scarring. The periwound skin appearance did not exhibit: Callus, Crepitus, Excoriation, Induration, Rash, Dry/Scaly, Maceration, Atrophie Blanche, Cyanosis, Ecchymosis, Hemosiderin Staining, Mottled, Pallor, Rubor, Erythema. Periwound temperature was noted as No Abnormality. The periwound has tenderness on palpation. Assessment Active Problems ICD-10 Type 2 diabetes mellitus with other skin ulcer Cutaneous abscess of back [any part, except buttock] Opioid dependence, uncomplicated Essential (primary) hypertension Chronic viral hepatitis C Plan Wound Cleansing: Wound #1 Proximal,Midline Back: Clean wound with Normal Saline. May Shower, gently pat wound dry prior to applying new dressing. Wound #2 Distal,Midline Back: Clean wound with Normal Saline. May Shower, gently pat wound dry prior to applying new dressing. Anesthetic (add to Medication List): Wound #1 Proximal,Midline Back: Topical Lidocaine 4% cream applied to wound bed prior to debridement (In Clinic Only). Wound #2 Distal,Midline Back: Topical Lidocaine 4% cream applied to wound bed prior to debridement (In Clinic Only). Primary Wound Dressing: Wound #1 Proximal,Midline  Back: Iodoform packing Gauze - Change daily Wound #2 Distal,Midline Back: Iodoform packing Gauze - Change daily Secondary Dressing: Wound #1 Proximal,Midline Back: ABD pad - Change daily or as needed for excessive drainage Wound #2 Distal,Midline Back: ABD pad - Change daily or as needed for excessive drainage Dressing Change Frequency: Wound #1 Proximal,Midline Back: Change dressing every day. Wound #2 Distal,Midline Back: Change dressing every day. Follow-up Appointments: Wound #1 Proximal,Midline Back: Return Appointment in 1 week. Tony Long, Tony Long ([id number]) Wound #2 Distal,Midline Back: Return Appointment in 1 week. Fortunately this point no sharp debridement was necessary as of today. We will subsequently see the patient back for reevaluation to see how things stand we are going to initiate treatment with the above wound care measures. The patient's wife as well as the patient are in agreement with this. Please see above for specific wound care orders. We will see patient for re-evaluation in 1 week(s) here in the clinic. If anything worsens or changes patient will contact our office for additional recommendations. Electronic Signature(s) Signed: 06/03/2018 1:26:35 AM By: Lenda Kelp PA-C Entered By: Lenda Kelp on 06/03/2018 01:26:13 Tony Long ([id number]) -------------------------------------------------------------------------------- ROS/PFSH Details Patient Name: Tony Long Date of Service: 05/31/2018 1:00 PM Medical Record Number: [id number] Patient Account Number: [id number] Date of Birth/Sex: [birth year] (41 y.o. M) Treating RN: Tony Jasmine Primary Care Provider: Raliegh Ip Other Clinician: Referring Provider: Raliegh Ip Treating Provider/Extender: Linwood Dibbles, HOYT Weeks in Treatment: 0 Information Obtained From Patient Wound History Do you currently have one or more open woundso Yes How many open wounds do you currently haveo  2 Approximately how long have you  had your woundso 3 months How have you been treating your wound(s) until nowo wet gauze and bandage Has your wound(s) ever healed and then re-openedo No Have you had any lab work done in the past montho No Have you tested positive for an antibiotic resistant organism (MRSA, VRE)o No Have you tested positive for osteomyelitis (bone infection)o Yes Have you had any tests for circulation on your legso No Eyes Complaints and Symptoms: Negative for: Dry Eyes; Vision Changes; Glasses / Contacts Medical History: Negative for: Cataracts; Glaucoma Ear/Nose/Mouth/Throat Complaints and Symptoms: Negative for: Difficult clearing ears; Sinusitis Medical History: Negative for: Chronic sinus problems/congestion; Middle ear problems Hematologic/Lymphatic Complaints and Symptoms: Negative for: Bleeding / Clotting Disorders; Human Immunodeficiency Virus Medical History: Negative for: Anemia; Hemophilia; Human Immunodeficiency Virus; Lymphedema; Sickle Cell Disease Respiratory Complaints and Symptoms: Negative for: Chronic or frequent coughs; Shortness of Breath Medical History: Negative for: Aspiration; Asthma; Chronic Obstructive Pulmonary Disease (COPD); Pneumothorax; Sleep Apnea; Tuberculosis Cardiovascular Tony Long, Tony Long ([id number]) Complaints and Symptoms: Negative for: Chest pain; LE edema Medical History: Negative for: Angina; Arrhythmia; Congestive Heart Failure; Coronary Artery Disease; Deep Vein Thrombosis; Hypertension; Hypotension; Myocardial Infarction; Peripheral Arterial Disease; Peripheral Venous Disease; Phlebitis; Vasculitis Gastrointestinal Complaints and Symptoms: Negative for: Frequent diarrhea; Nausea; Vomiting Medical History: Negative for: Cirrhosis ; Colitis; Crohnos; Hepatitis A; Hepatitis B; Hepatitis C Endocrine Complaints and Symptoms: Negative for: Hepatitis; Thyroid disease; Polydypsia (Excessive Thirst) Medical  History: Positive for: Type II Diabetes - 7 years Negative for: Type I Diabetes Time with diabetes: 7 years Treated with: Insulin Genitourinary Complaints and Symptoms: Negative for: Kidney failure/ Dialysis; Incontinence/dribbling Medical History: Negative for: End Stage Renal Disease Immunological Complaints and Symptoms: Negative for: Hives; Itching Medical History: Negative for: Lupus Erythematosus; Raynaudos; Scleroderma Integumentary (Skin) Complaints and Symptoms: Negative for: Wounds; Bleeding or bruising tendency; Breakdown; Swelling Medical History: Negative for: History of Burn; History of pressure wounds Musculoskeletal Complaints and Symptoms: Negative for: Muscle Pain; Muscle Weakness Medical History: Negative for: Gout; Rheumatoid Arthritis; Osteoarthritis; Osteomyelitis Tony Long, Tony Long ([id number]) Neurologic Complaints and Symptoms: Negative for: Numbness/parasthesias Medical History: Negative for: Dementia; Neuropathy; Quadriplegia; Paraplegia; Seizure Disorder Oncologic Complaints and Symptoms: No Complaints or Symptoms Medical History: Negative for: Received Chemotherapy; Received Radiation Immunizations Pneumococcal Vaccine: Received Pneumococcal Vaccination: No Implantable Devices Family and Social History Cancer: Yes - Maternal Grandparents; Diabetes: Yes - Father; Heart Disease: Yes - Father; Hereditary Spherocytosis: No; Hypertension: No; Kidney Disease: No; Lung Disease: No; Seizures: No; Stroke: No; Thyroid Problems: No; Tuberculosis: No; Current every day smoker; Marital Status - Single; Alcohol Use: Moderate; Drug Use: Prior History; Caffeine Use: Daily; Financial Concerns: No; Food, Clothing or Shelter Needs: No; Support System Lacking: No; Transportation Concerns: No; Advanced Directives: No; Do not resuscitate: No; Living Will: No; Medical Power of Attorney: No Electronic Signature(s) Signed: 06/01/2018 4:17:15 PM By: Tony Long, Tony Long Signed:  06/03/2018 1:26:35 AM By: Lenda KelpStone III, Hoyt PA-C Entered By: Tony Long, Tony Long on 05/31/2018 13:24:31 Tony Long, Tony Long ([id number]) -------------------------------------------------------------------------------- SuperBill Details Patient Name: Tony Long, Lavern Date of Service: 05/31/2018 Medical Record Number: [id number] Patient Account Number: [id number] Date of Birth/Sex: 08/07/1975 (42 y.o. M) Treating RN: Arnette NorrisBiell, Kristina Primary Care Provider: Raliegh IpSTROUD, NATALIE Other Clinician: Referring Provider: Raliegh IpSTROUD, NATALIE Treating Provider/Extender: Linwood DibblesSTONE III, HOYT Weeks in Treatment: 0 Diagnosis Coding ICD-10 Codes Code Description E11.622 Type 2 diabetes mellitus with other skin ulcer L02.212 Cutaneous abscess of back [any part, except buttock] F11.20 Opioid dependence, uncomplicated I10 Essential (primary) hypertension B18.2 Chronic viral hepatitis C Facility Procedures CPT4 Code: [id number] Description: [id number] - WOUND CARE  VISIT-LEV 4 EST PT Modifier: Quantity: 1 Physician Procedures CPT4 Code: [id number] Description: WC PHYS LEVEL 3 o NEW PT ICD-10 Diagnosis Description E11.622 Type 2 diabetes mellitus with other skin ulcer L02.212 Cutaneous abscess of back [any part, except buttock] F11.20 Opioid dependence, uncomplicated I10 Essential (primary)  hypertension Modifier: Quantity: 1 Electronic Signature(s) Signed: 06/03/2018 1:26:35 AM By: Lenda Kelp PA-C Entered By: Lenda Kelp on 06/01/2018 09:01:46

## 2018-06-07 ENCOUNTER — Encounter: Payer: Medicaid Other | Admitting: Physician Assistant

## 2018-06-07 DIAGNOSIS — E11622 Type 2 diabetes mellitus with other skin ulcer: Secondary | ICD-10-CM | POA: Diagnosis not present

## 2018-06-07 DIAGNOSIS — L98422 Non-pressure chronic ulcer of back with fat layer exposed: Secondary | ICD-10-CM | POA: Diagnosis not present

## 2018-06-07 DIAGNOSIS — T8189XA Other complications of procedures, not elsewhere classified, initial encounter: Secondary | ICD-10-CM | POA: Diagnosis not present

## 2018-06-10 DIAGNOSIS — R338 Other retention of urine: Secondary | ICD-10-CM | POA: Diagnosis not present

## 2018-06-10 NOTE — Progress Notes (Signed)
Tony Long, Tony Long ([id number]) Visit Report for 06/07/2018 Chief Complaint Document Details Patient Name: Tony Long, Tony Long Date of Service: 06/07/2018 1:45 PM Medical Record Number: [id number] Patient Account Number: [id number] Date of Birth/Sex: 10/14/1975 (42 y.o. M) Treating RN: Arnette Norris Primary Care Provider: Raliegh Ip Other Clinician: Referring Provider: Raliegh Ip Treating Provider/Extender: Linwood Dibbles, Cevin Rubinstein Weeks in Treatment: 1 Information Obtained from: Patient Chief Complaint Thoracic spine abscess with lumbar spinal region ulcerations Electronic Signature(s) Signed: 06/08/2018 7:20:38 AM By: Lenda Kelp PA-C Entered By: Lenda Kelp on 06/07/2018 14:19:15 Tony Long ([id number]) -------------------------------------------------------------------------------- HPI Details Patient Name: Tony Long Date of Service: 06/07/2018 1:45 PM Medical Record Number: [id number] Patient Account Number: [id number] Date of Birth/Sex: 17-Nov-1975 (42 y.o. M) Treating RN: Arnette Norris Primary Care Provider: Raliegh Ip Other Clinician: Referring Provider: Raliegh Ip Treating Provider/Extender: Linwood Dibbles, Hilari Wethington Weeks in Treatment: 1 History of Present Illness Associated Signs and Symptoms: Patient does have a history of chronic viral hepatitis see, hypertension, and opioid dependence. HPI Description: 05/31/18 on evaluation today patient presents for initial evaluation or office for two issues that he's been having with openings in the lumbar spine extending into the lower thoracic region. Subsequently it appears that as best I can tell from his notes he likely had an epidural abscess which subsequently led to these open areas. He was seen by infectious disease, Rexene Alberts, who referred him to [place] for management of the wounds. Apparently he has completed his course of IV antibiotic therapy at this point. Patient did have a technically limited  examination of the cervical and thoracic spine by way of it and MRI. Unfortunately he was not able to tolerate the full length of the exam therefore only sagittal sections were obtained of the thoracic spine region. Fortunately there is no imaging findings to suggest spinal cord infarction. There was interval improvement in the epidural collection (epidural abscess) on this examination when compared to the prior examination which was 03/22/18. The date of this MRI was 03/30/18. Again there was no imaging performed of the lumbar spine at this time. No new disc guidance or facet arthritis noted. There is no evidence of osteomyelitis. Currently the patient does not have any significant pain which is good news. With that being said he is having some discomfort at this point mainly with packing of the wounds although I think packing is going to continue to be the treatment of choice. No fevers, chills, nausea, or vomiting noted at this time. 06/07/18 on evaluation today patient's wounds in the back region specifically the lumbar spine area appear to be doing very well at this time. In fact the 12 o'clock total of the cephalad wound shows evidence of not being quite as wide open as what I previously noted. In fact there is gonna be no chance of packing anything into this area at this time. The caudal wound actually shows signs of being about the same although again as I explained to the patient is gonna take some time for this area to heal. Electronic Signature(s) Signed: 06/08/2018 7:20:38 AM By: Lenda Kelp PA-C Entered By: Lenda Kelp on 06/08/2018 06:52:56 Tony Long ([id number]) -------------------------------------------------------------------------------- Physical Exam Details Patient Name: Tony Long Date of Service: 06/07/2018 1:45 PM Medical Record Number: [id number] Patient Account Number: [id number] Date of Birth/Sex: [birth year] (42 y.o. M) Treating RN: Arnette Norris Primary Care Provider: Raliegh Ip Other Clinician: Referring Provider: Raliegh Ip Treating Provider/Extender: STONE III, Selma Mink Weeks in Treatment: 1 Constitutional Well-nourished and well-hydrated in no  acute distress. Respiratory normal breathing without difficulty. clear to auscultation bilaterally. Cardiovascular regular rate and rhythm with normal S1, S2. Psychiatric this patient is able to make decisions and demonstrates good insight into disease process. Alert and Oriented x 3. pleasant and cooperative. Notes Upon inspection at this time patient's wounds again show no signs of worsening and are about the same with maybe some slight improvement in the depth of the tunnel at the 12 o'clock location in the Cephalad wound Electronic Signature(s) Signed: 06/08/2018 7:20:38 AM By: Lenda Kelp PA-C Entered By: Lenda Kelp on 06/08/2018 06:53:52 Tony Long ([id number]) -------------------------------------------------------------------------------- Physician Orders Details Patient Name: Tony Long Date of Service: 06/07/2018 1:45 PM Medical Record Number: [id number] Patient Account Number: [id number] Date of Birth/Sex: May 11, 1976 (42 y.o. M) Treating RN: Arnette Norris Primary Care Provider: Raliegh Ip Other Clinician: Referring Provider: Raliegh Ip Treating Provider/Extender: Linwood Dibbles, Billyjoe Go Weeks in Treatment: 1 Verbal / Phone Orders: No Diagnosis Coding ICD-10 Coding Code Description E11.622 Type 2 diabetes mellitus with other skin ulcer L02.212 Cutaneous abscess of back [any part, except buttock] F11.20 Opioid dependence, uncomplicated I10 Essential (primary) hypertension B18.2 Chronic viral hepatitis C Wound Cleansing Wound #1 Proximal,Midline Back o Clean wound with Normal Saline. o May Shower, gently pat wound dry prior to applying new dressing. Wound #2 Distal,Midline Back o Clean wound with Normal Saline. o  May Shower, gently pat wound dry prior to applying new dressing. Anesthetic (add to Medication List) Wound #1 Proximal,Midline Back o Topical Lidocaine 4% cream applied to wound bed prior to debridement (In Clinic Only). Wound #2 Distal,Midline Back o Topical Lidocaine 4% cream applied to wound bed prior to debridement (In Clinic Only). Primary Wound Dressing Wound #1 Proximal,Midline Back o Iodoform packing Gauze - Change daily Wound #2 Distal,Midline Back o Iodoform packing Gauze - Change daily Secondary Dressing Wound #1 Proximal,Midline Back o ABD pad - Change daily or as needed for excessive drainage Wound #2 Distal,Midline Back o ABD pad - Change daily or as needed for excessive drainage Dressing Change Frequency Wound #1 Proximal,Midline Back o Change dressing every day. Tony Long, Tony Long ([id number]) Wound #2 Distal,Midline Back o Change dressing every day. Follow-up Appointments Wound #1 Proximal,Midline Back o Return Appointment in 1 week. Wound #2 Distal,Midline Back o Return Appointment in 1 week. Electronic Signature(s) Signed: 06/08/2018 7:20:38 AM By: Lenda Kelp PA-C Signed: 06/08/2018 5:05:35 PM By: Arnette Norris Entered By: Arnette Norris on 06/07/2018 14:29:15 Tony Long ([id number]) -------------------------------------------------------------------------------- Problem List Details Patient Name: Tony Long Date of Service: 06/07/2018 1:45 PM Medical Record Number: [id number] Patient Account Number: [id number] Date of Birth/Sex: 03-25-1976 (42 y.o. M) Treating RN: Arnette Norris Primary Care Provider: Raliegh Ip Other Clinician: Referring Provider: Raliegh Ip Treating Provider/Extender: Linwood Dibbles, Naitik Hermann Weeks in Treatment: 1 Active Problems ICD-10 Evaluated Encounter Code Description Active Date Today Diagnosis E11.622 Type 2 diabetes mellitus with other skin ulcer 05/31/2018 No Yes L02.212 Cutaneous  abscess of back [any part, except buttock] 05/31/2018 No Yes F11.20 Opioid dependence, uncomplicated 05/31/2018 No Yes I10 Essential (primary) hypertension 05/31/2018 No Yes B18.2 Chronic viral hepatitis C 05/31/2018 No Yes Inactive Problems Resolved Problems Electronic Signature(s) Signed: 06/08/2018 7:20:38 AM By: Lenda Kelp PA-C Entered By: Lenda Kelp on 06/07/2018 14:18:38 Tony Long ([id number]) -------------------------------------------------------------------------------- Progress Note Details Patient Name: Tony Long Date of Service: 06/07/2018 1:45 PM Medical Record Number: [id number] Patient Account Number: [id number] Date of Birth/Sex: 08/29/1975 (42 y.o. M) Treating RN: Arnette Norris Primary Care Provider: Raliegh Ip Other Clinician: Referring  Provider: Raliegh Ip Treating Provider/Extender: Linwood Dibbles, Eyad Rochford Weeks in Treatment: 1 Subjective Chief Complaint Information obtained from Patient Thoracic spine abscess with lumbar spinal region ulcerations History of Present Illness (HPI) The following HPI elements were documented for the patient's wound: Associated Signs and Symptoms: Patient does have a history of chronic viral hepatitis see, hypertension, and opioid dependence. 05/31/18 on evaluation today patient presents for initial evaluation or office for two issues that he's been having with openings in the lumbar spine extending into the lower thoracic region. Subsequently it appears that as best I can tell from his notes he likely had an epidural abscess which subsequently led to these open areas. He was seen by infectious disease, Rexene Alberts, who referred him to [place] for management of the wounds. Apparently he has completed his course of IV antibiotic therapy at this point. Patient did have a technically limited examination of the cervical and thoracic spine by way of it and MRI. Unfortunately he was not able to tolerate the full length  of the exam therefore only sagittal sections were obtained of the thoracic spine region. Fortunately there is no imaging findings to suggest spinal cord infarction. There was interval improvement in the epidural collection (epidural abscess) on this examination when compared to the prior examination which was 03/22/18. The date of this MRI was 03/30/18. Again there was no imaging performed of the lumbar spine at this time. No new disc guidance or facet arthritis noted. There is no evidence of osteomyelitis. Currently the patient does not have any significant pain which is good news. With that being said he is having some discomfort at this point mainly with packing of the wounds although I think packing is going to continue to be the treatment of choice. No fevers, chills, nausea, or vomiting noted at this time. 06/07/18 on evaluation today patient's wounds in the back region specifically the lumbar spine area appear to be doing very well at this time. In fact the 12 o'clock total of the cephalad wound shows evidence of not being quite as wide open as what I previously noted. In fact there is gonna be no chance of packing anything into this area at this time. The caudal wound actually shows signs of being about the same although again as I explained to the patient is gonna take some time for this area to heal. Patient History Information obtained from Patient. Family History Cancer - Maternal Grandparents, Diabetes - Father, Heart Disease - Father, No family history of Hereditary Spherocytosis, Hypertension, Kidney Disease, Lung Disease, Seizures, Stroke, Thyroid Problems, Tuberculosis. Social History Current every day smoker, Marital Status - Single, Alcohol Use - Moderate, Drug Use - Prior History, Caffeine Use - Daily. Review of Systems (ROS) Constitutional Symptoms (General Health) Denies complaints or symptoms of Fever, Chills. Respiratory The patient has no complaints or  symptoms. Tony Long, Tony Long ([id number]) Cardiovascular The patient has no complaints or symptoms. Psychiatric The patient has no complaints or symptoms. Objective Constitutional Well-nourished and well-hydrated in no acute distress. Vitals Time Taken: 2:00 PM, Height: 71 in, Weight: 155 lbs, BMI: 21.6, Temperature: 97.8 F, Pulse: 96 bpm, Respiratory Rate: 16 breaths/min, Blood Pressure: 124/67 mmHg. Respiratory normal breathing without difficulty. clear to auscultation bilaterally. Cardiovascular regular rate and rhythm with normal S1, S2. Psychiatric this patient is able to make decisions and demonstrates good insight into disease process. Alert and Oriented x 3. pleasant and cooperative. General Notes: Upon inspection at this time patient's wounds again show no signs of worsening and  are about the same with maybe some slight improvement in the depth of the tunnel at the 12 o'clock location in the Cephalad wound Integumentary (Hair, Skin) Wound #1 status is Open. Original cause of wound was Surgical Injury. The wound is located on the Proximal,Midline Back. The wound measures 1.8cm length x 0.7cm width x 0.7cm depth; 0.99cm^2 area and 0.693cm^3 volume. There is Fat Layer (Subcutaneous Tissue) Exposed exposed. Tunneling has been noted at 12:00 with a maximum distance of 3.8cm. Undermining begins at 7:00 and ends at 5:00 with a maximum distance of 1.7cm. There is a medium amount of serous drainage noted. The wound margin is flat and intact. There is large (67-100%) red granulation within the wound bed. There is a small (1-33%) amount of necrotic tissue within the wound bed including Adherent Slough. The periwound skin appearance exhibited: Scarring. The periwound skin appearance did not exhibit: Callus, Crepitus, Excoriation, Induration, Rash, Dry/Scaly, Maceration, Atrophie Blanche, Cyanosis, Ecchymosis, Hemosiderin Staining, Mottled, Pallor, Rubor, Erythema. Periwound temperature was  noted as No Abnormality. The periwound has tenderness on palpation. Wound #2 status is Open. Original cause of wound was Surgical Injury. The wound is located on the Distal,Midline Back. The wound measures 0.9cm length x 0.4cm width x 1.1cm depth; 0.283cm^2 area and 0.311cm^3 volume. There is Fat Layer (Subcutaneous Tissue) Exposed exposed. There is no tunneling or undermining noted. There is a small amount of serous drainage noted. The wound margin is flat and intact. There is small (1-33%) red granulation within the wound bed. There is a small (1-33%) amount of necrotic tissue within the wound bed including Adherent Slough. The periwound skin appearance exhibited: Scarring. The periwound skin appearance did not exhibit: Callus, Crepitus, Excoriation, Induration, Rash, Dry/Scaly, Maceration, Atrophie Blanche, Cyanosis, Ecchymosis, Hemosiderin Staining, Mottled, Pallor, Rubor, Erythema. Periwound temperature was noted as No Abnormality. The periwound has tenderness on palpation. Tony Long, Tony Long ([id number]) Assessment Active Problems ICD-10 Type 2 diabetes mellitus with other skin ulcer Cutaneous abscess of back [any part, except buttock] Opioid dependence, uncomplicated Essential (primary) hypertension Chronic viral hepatitis C Plan Wound Cleansing: Wound #1 Proximal,Midline Back: Clean wound with Normal Saline. May Shower, gently pat wound dry prior to applying new dressing. Wound #2 Distal,Midline Back: Clean wound with Normal Saline. May Shower, gently pat wound dry prior to applying new dressing. Anesthetic (add to Medication List): Wound #1 Proximal,Midline Back: Topical Lidocaine 4% cream applied to wound bed prior to debridement (In Clinic Only). Wound #2 Distal,Midline Back: Topical Lidocaine 4% cream applied to wound bed prior to debridement (In Clinic Only). Primary Wound Dressing: Wound #1 Proximal,Midline Back: Iodoform packing Gauze - Change daily Wound #2  Distal,Midline Back: Iodoform packing Gauze - Change daily Secondary Dressing: Wound #1 Proximal,Midline Back: ABD pad - Change daily or as needed for excessive drainage Wound #2 Distal,Midline Back: ABD pad - Change daily or as needed for excessive drainage Dressing Change Frequency: Wound #1 Proximal,Midline Back: Change dressing every day. Wound #2 Distal,Midline Back: Change dressing every day. Follow-up Appointments: Wound #1 Proximal,Midline Back: Return Appointment in 1 week. Wound #2 Distal,Midline Back: Return Appointment in 1 week. My suggestion currently is gonna be that we continue with the Current wound care measures since things seem to be doing well. He doesn't seem to have quite as much drainage as was previously noted which is also good news. We will subsequently see were things stand at follow-up. Tony Long, Tony Long ([id number]) Please see above for specific wound care orders. We will see patient for re-evaluation in 1 week(s)  here in the clinic. If anything worsens or changes patient will contact our office for additional recommendations. Electronic Signature(s) Signed: 06/08/2018 7:20:38 AM By: Lenda Kelp PA-C Entered By: Lenda Kelp on 06/08/2018 06:54:19 Tony Long ([id number]) -------------------------------------------------------------------------------- ROS/PFSH Details Patient Name: Tony Long Date of Service: 06/07/2018 1:45 PM Medical Record Number: [id number] Patient Account Number: [id number] Date of Birth/Sex: 07/27/1975 (42 y.o. M) Treating RN: Arnette Norris Primary Care Provider: Raliegh Ip Other Clinician: Referring Provider: Raliegh Ip Treating Provider/Extender: Linwood Dibbles, Festus Pursel Weeks in Treatment: 1 Information Obtained From Patient Wound History Do you currently have one or more open woundso Yes How many open wounds do you currently haveo 2 Approximately how long have you had your woundso 3 months How have  you been treating your wound(s) until nowo wet gauze and bandage Has your wound(s) ever healed and then re-openedo No Have you had any lab work done in the past montho No Have you tested positive for an antibiotic resistant organism (MRSA, VRE)o No Have you tested positive for osteomyelitis (bone infection)o Yes Have you had any tests for circulation on your legso No Constitutional Symptoms (General Health) Complaints and Symptoms: Negative for: Fever; Chills Eyes Medical History: Negative for: Cataracts; Glaucoma Ear/Nose/Mouth/Throat Medical History: Negative for: Chronic sinus problems/congestion; Middle ear problems Hematologic/Lymphatic Medical History: Negative for: Anemia; Hemophilia; Human Immunodeficiency Virus; Lymphedema; Sickle Cell Disease Respiratory Complaints and Symptoms: No Complaints or Symptoms Medical History: Negative for: Aspiration; Asthma; Chronic Obstructive Pulmonary Disease (COPD); Pneumothorax; Sleep Apnea; Tuberculosis Cardiovascular Complaints and Symptoms: No Complaints or Symptoms Medical History: Negative for: Angina; Arrhythmia; Congestive Heart Failure; Coronary Artery Disease; Deep Vein Thrombosis; Tony Long, Tony Long ([id number]) Hypertension; Hypotension; Myocardial Infarction; Peripheral Arterial Disease; Peripheral Venous Disease; Phlebitis; Vasculitis Gastrointestinal Medical History: Negative for: Cirrhosis ; Colitis; Crohnos; Hepatitis A; Hepatitis B; Hepatitis C Endocrine Medical History: Positive for: Type II Diabetes - 7 years Negative for: Type I Diabetes Time with diabetes: 7 years Treated with: Insulin Genitourinary Medical History: Negative for: End Stage Renal Disease Immunological Medical History: Negative for: Lupus Erythematosus; Raynaudos; Scleroderma Integumentary (Skin) Medical History: Negative for: History of Burn; History of pressure wounds Musculoskeletal Medical History: Negative for: Gout; Rheumatoid  Arthritis; Osteoarthritis; Osteomyelitis Neurologic Medical History: Negative for: Dementia; Neuropathy; Quadriplegia; Paraplegia; Seizure Disorder Oncologic Medical History: Negative for: Received Chemotherapy; Received Radiation Psychiatric Complaints and Symptoms: No Complaints or Symptoms Immunizations Pneumococcal Vaccine: Received Pneumococcal Vaccination: No Implantable Devices Family and Social History Tony Long, Tony Long ([id number]) Cancer: Yes - Maternal Grandparents; Diabetes: Yes - Father; Heart Disease: Yes - Father; Hereditary Spherocytosis: No; Hypertension: No; Kidney Disease: No; Lung Disease: No; Seizures: No; Stroke: No; Thyroid Problems: No; Tuberculosis: No; Current every day smoker; Marital Status - Single; Alcohol Use: Moderate; Drug Use: Prior History; Caffeine Use: Daily; Financial Concerns: No; Food, Clothing or Shelter Needs: No; Support System Lacking: No; Transportation Concerns: No; Advanced Directives: No; Patient does not want information on Advanced Directives; Do not resuscitate: No; Living Will: No; Medical Power of Attorney: No Physician Affirmation I have reviewed and agree with the above information. Electronic Signature(s) Signed: 06/08/2018 7:20:38 AM By: Lenda Kelp PA-C Signed: 06/08/2018 5:05:35 PM By: Arnette Norris Entered By: Lenda Kelp on 06/08/2018 06:53:15 Tony Long ([id number]) -------------------------------------------------------------------------------- SuperBill Details Patient Name: Tony Long Date of Service: 06/07/2018 Medical Record Number: [id number] Patient Account Number: [id number] Date of Birth/Sex: May 25, 1976 (42 y.o. M) Treating RN: Arnette Norris Primary Care Provider: Raliegh Ip Other Clinician: Referring Provider: Raliegh Ip Treating Provider/Extender: Linwood Dibbles, Derwin Reddy Weeks  in Treatment: 1 Diagnosis Coding ICD-10 Codes Code Description E11.622 Type 2 diabetes mellitus with other  skin ulcer L02.212 Cutaneous abscess of back [any part, except buttock] F11.20 Opioid dependence, uncomplicated I10 Essential (primary) hypertension B18.2 Chronic viral hepatitis C Facility Procedures CPT4 Code: [id number] Description: 99214 - WOUND CARE VISIT-LEV 4 EST PT Modifier: Quantity: 1 Physician Procedures CPT4 Code: [id number] Description: 99214 - WC PHYS LEVEL 4 - EST PT ICD-10 Diagnosis Description E11.622 Type 2 diabetes mellitus with other skin ulcer L02.212 Cutaneous abscess of back [any part, except buttock] F11.20 Opioid dependence, uncomplicated I10 Essential (primary)  hypertension Modifier: Quantity: 1 Electronic Signature(s) Signed: 06/08/2018 7:20:38 AM By: Lenda KelpStone III, Janiel Derhammer PA-C Entered By: Lenda KelpStone III, Jese Comella on 06/08/2018 06:54:31

## 2018-06-10 NOTE — Progress Notes (Signed)
AUTHER, LYERLY ([id number]) Visit Report for 06/07/2018 Arrival Information Details Patient Name: Tony Long, Tony Long Date of Service: 06/07/2018 1:45 PM Medical Record Number: [id number] Patient Account Number: [id number] Date of Birth/Sex: 03/21/1976 (42 y.o. M) Treating RN: Rema Jasmine Primary Care Paizleigh Wilds: Raliegh Ip Other Clinician: Referring Melvina Pangelinan: Raliegh Ip Treating Jacqualine Weichel/Extender: Linwood Dibbles, HOYT Weeks in Treatment: 1 Visit Information History Since Last Visit Added or deleted any medications: No Patient Arrived: Ambulatory Any new allergies or adverse reactions: No Arrival Time: 13:55 Had a fall or experienced change in No Accompanied By: self activities of daily living that may affect Transfer Assistance: None risk of falls: Patient Identification Verified: Yes Signs or symptoms of abuse/neglect since last visito No Secondary Verification Process Completed: Yes Hospitalized since last visit: No Implantable device outside of the clinic excluding No cellular tissue based products placed in the center since last visit: Has Dressing in Place as Prescribed: Yes Pain Present Now: No Electronic Signature(s) Signed: 06/07/2018 4:12:55 PM By: Rema Jasmine Entered By: Rema Jasmine on 06/07/2018 13:56:29 Tony Long ([id number]) -------------------------------------------------------------------------------- Clinic Level of Care Assessment Details Patient Name: Tony Long Date of Service: 06/07/2018 1:45 PM Medical Record Number: [id number] Patient Account Number: [id number] Date of Birth/Sex: 11-Aug-1975 (42 y.o. M) Treating RN: Arnette Norris Primary Care Arron Tetrault: Raliegh Ip Other Clinician: Referring Caral Whan: Raliegh Ip Treating Jerral Mccauley/Extender: Linwood Dibbles, HOYT Weeks in Treatment: 1 Clinic Level of Care Assessment Items TOOL 4 Quantity Score []  - Use when only an EandM is performed on FOLLOW-UP visit 0 ASSESSMENTS - Nursing Assessment /  Reassessment X - Reassessment of Co-morbidities (includes updates in patient status) 1 10 X- 1 5 Reassessment of Adherence to Treatment Plan ASSESSMENTS - Wound and Skin Assessment / Reassessment []  - Simple Wound Assessment / Reassessment - one wound 0 X- 2 5 Complex Wound Assessment / Reassessment - multiple wounds []  - 0 Dermatologic / Skin Assessment (not related to wound area) ASSESSMENTS - Focused Assessment []  - Circumferential Edema Measurements - multi extremities 0 []  - 0 Nutritional Assessment / Counseling / Intervention []  - 0 Lower Extremity Assessment (monofilament, tuning fork, pulses) []  - 0 Peripheral Arterial Disease Assessment (using hand held doppler) ASSESSMENTS - Ostomy and/or Continence Assessment and Care []  - Incontinence Assessment and Management 0 []  - 0 Ostomy Care Assessment and Management (repouching, etc.) PROCESS - Coordination of Care X - Simple Patient / Family Education for ongoing care 1 15 []  - 0 Complex (extensive) Patient / Family Education for ongoing care X- 1 10 Staff obtains Chiropractor, Records, Test Results / Process Orders []  - 0 Staff telephones HHA, Nursing Homes / Clarify orders / etc []  - 0 Routine Transfer to another Facility (non-emergent condition) []  - 0 Routine Hospital Admission (non-emergent condition) []  - 0 New Admissions / Manufacturing engineer / Ordering NPWT, Apligraf, etc. []  - 0 Emergency Hospital Admission (emergent condition) X- 1 10 Simple Discharge Coordination CAMREN, LIPSETT ([id number]) []  - 0 Complex (extensive) Discharge Coordination PROCESS - Special Needs []  - Pediatric / Minor Patient Management 0 []  - 0 Isolation Patient Management []  - 0 Hearing / Language / Visual special needs []  - 0 Assessment of Community assistance (transportation, D/C planning, etc.) []  - 0 Additional assistance / Altered mentation []  - 0 Support Surface(s) Assessment (bed, cushion, seat, etc.) INTERVENTIONS -  Wound Cleansing / Measurement []  - Simple Wound Cleansing - one wound 0 X- 2 5 Complex Wound Cleansing - multiple wounds X- 1 5 Wound Imaging (photographs - any number of wounds) []  -  0 Wound Tracing (instead of photographs) []  - 0 Simple Wound Measurement - one wound X- 2 5 Complex Wound Measurement - multiple wounds INTERVENTIONS - Wound Dressings []  - Small Wound Dressing one or multiple wounds 0 X- 2 15 Medium Wound Dressing one or multiple wounds []  - 0 Large Wound Dressing one or multiple wounds []  - 0 Application of Medications - topical []  - 0 Application of Medications - injection INTERVENTIONS - Miscellaneous []  - External ear exam 0 []  - 0 Specimen Collection (cultures, biopsies, blood, body fluids, etc.) []  - 0 Specimen(s) / Culture(s) sent or taken to Lab for analysis []  - 0 Patient Transfer (multiple staff / Nurse, adult / Similar devices) []  - 0 Simple Staple / Suture removal (25 or less) []  - 0 Complex Staple / Suture removal (26 or more) []  - 0 Hypo / Hyperglycemic Management (close monitor of Blood Glucose) []  - 0 Ankle / Brachial Index (ABI) - do not check if billed separately X- 1 5 Vital Signs ABDULWAHAB, DEMELO ([id number]) Has the patient been seen at the hospital within the last three years: Yes Total Score: 120 Level Of Care: New/Established - Level 4 Electronic Signature(s) Signed: 06/08/2018 5:05:35 PM By: Arnette Norris Entered By: Arnette Norris on 06/07/2018 14:28:18 Tony Long ([id number]) -------------------------------------------------------------------------------- Lower Extremity Assessment Details Patient Name: Tony Long Date of Service: 06/07/2018 1:45 PM Medical Record Number: [id number] Patient Account Number: [id number] Date of Birth/Sex: 1976-02-10 (42 y.o. M) Treating RN: Rema Jasmine Primary Care Baylei Siebels: Raliegh Ip Other Clinician: Referring Jammi Morrissette: Raliegh Ip Treating Rossie Scarfone/Extender: Linwood Dibbles,  HOYT Weeks in Treatment: 1 Electronic Signature(s) Signed: 06/07/2018 4:12:55 PM By: Rema Jasmine Entered By: Rema Jasmine on 06/07/2018 14:00:05 Tony Long ([id number]) -------------------------------------------------------------------------------- Multi Wound Chart Details Patient Name: Tony Long Date of Service: 06/07/2018 1:45 PM Medical Record Number: [id number] Patient Account Number: [id number] Date of Birth/Sex: Jun 30, 1976 (42 y.o. M) Treating RN: Arnette Norris Primary Care Cerita Rabelo: Raliegh Ip Other Clinician: Referring  Grosser: Raliegh Ip Treating Yajaira Doffing/Extender: STONE III, HOYT Weeks in Treatment: 1 Vital Signs Height(in): 71 Pulse(bpm): 96 Weight(lbs): 155 Blood Pressure(mmHg): 124/67 Body Mass Index(BMI): 22 Temperature(F): 97.8 Respiratory Rate 16 (breaths/min): Photos: [1:No Photos] [2:No Photos] [N/A:N/A] Wound Location: [1:Back - Midline, Proximal] [2:Back - Midline, Distal] [N/A:N/A] Wounding Event: [1:Surgical Injury] [2:Surgical Injury] [N/A:N/A] Primary Etiology: [1:Abscess] [2:Abscess] [N/A:N/A] Comorbid History: [1:Type II Diabetes] [2:Type II Diabetes] [N/A:N/A] Date Acquired: [1:03/21/2018] [2:03/21/2018] [N/A:N/A] Weeks of Treatment: [1:1] [2:1] [N/A:N/A] Wound Status: [1:Open] [2:Open] [N/A:N/A] Measurements L x W x D [1:1.8x0.7x0.7] [2:0.9x0.4x1.1] [N/A:N/A] (cm) Area (cm) : [1:0.99] [2:0.283] [N/A:N/A] Volume (cm) : [1:0.693] [2:0.311] [N/A:N/A] % Reduction in Area: [1:12.50%] [2:0.00%] [N/A:N/A] % Reduction in Volume: [1:12.50%] [2:0.00%] [N/A:N/A] Position 1 (o'clock): [1:12] Maximum Distance 1 (cm): [1:3.8] Starting Position 1 [1:7] (o'clock): Ending Position 1 [1:5] (o'clock): Maximum Distance 1 (cm): [1:1.7] Tunneling: [1:Yes] [2:No] [N/A:N/A] Undermining: [1:Yes] [2:No] [N/A:N/A] Classification: [1:Full Thickness Without Exposed Support Structures] [2:Full Thickness Without Exposed Support Structures]  [N/A:N/A] Exudate Amount: [1:Medium] [2:Small] [N/A:N/A] Exudate Type: [1:Serous] [2:Serous] [N/A:N/A] Exudate Color: [1:amber] [2:amber] [N/A:N/A] Wound Margin: [1:Flat and Intact] [2:Flat and Intact] [N/A:N/A] Granulation Amount: [1:Large (67-100%)] [2:Small (1-33%)] [N/A:N/A] Granulation Quality: [1:Red] [2:Red] [N/A:N/A] Necrotic Amount: [1:Small (1-33%)] [2:Small (1-33%)] [N/A:N/A] Exposed Structures: [1:Fat Layer (Subcutaneous Tissue) Exposed: Yes Fascia: No Tendon: No Muscle: No] [2:Fat Layer (Subcutaneous Tissue) Exposed: Yes Fascia: No Tendon: No Muscle: No] [N/A:N/A] Joint: No Joint: No Bone: No Bone: No Epithelialization: None None N/A Periwound Skin Texture: Scarring: Yes Scarring: Yes N/A Excoriation: No Excoriation: No  Induration: No Induration: No Callus: No Callus: No Crepitus: No Crepitus: No Rash: No Rash: No Periwound Skin Moisture: Maceration: No Maceration: No N/A Dry/Scaly: No Dry/Scaly: No Periwound Skin Color: Atrophie Blanche: No Atrophie Blanche: No N/A Cyanosis: No Cyanosis: No Ecchymosis: No Ecchymosis: No Erythema: No Erythema: No Hemosiderin Staining: No Hemosiderin Staining: No Mottled: No Mottled: No Pallor: No Pallor: No Rubor: No Rubor: No Temperature: No Abnormality No Abnormality N/A Tenderness on Palpation: Yes Yes N/A Wound Preparation: Ulcer Cleansing: Ulcer Cleansing: N/A Rinsed/Irrigated with Saline Rinsed/Irrigated with Saline Topical Anesthetic Applied: Topical Anesthetic Applied: Other: lidocaine 4% Other: lidocaine 4% Treatment Notes Electronic Signature(s) Signed: 06/08/2018 5:05:35 PM By: Arnette NorrisBiell, Kristina Entered By: Arnette NorrisBiell, Kristina on 06/07/2018 14:25:25 Tony ClevelandKENNEDY, Javiel ([id number]) -------------------------------------------------------------------------------- Multi-Disciplinary Care Plan Details Patient Name: Tony ClevelandKENNEDY, Yashua Date of Service: 06/07/2018 1:45 PM Medical Record Number:  [id number] Patient Account Number: [id number] Date of Birth/Sex: 03/21/1976 (42 y.o. M) Treating RN: Arnette NorrisBiell, Kristina Primary Care Ismail Graziani: Raliegh IpSTROUD, NATALIE Other Clinician: Referring Song Myre: Raliegh IpSTROUD, NATALIE Treating Cassidey Barrales/Extender: Linwood DibblesSTONE III, HOYT Weeks in Treatment: 1 Active Inactive Wound/Skin Impairment Nursing Diagnoses: Impaired tissue integrity Knowledge deficit related to ulceration/compromised skin integrity Goals: Ulcer/skin breakdown will have a volume reduction of 30% by week 4 Date Initiated: 05/31/2018 Target Resolution Date: 07/01/2018 Goal Status: Active Interventions: Assess patient/caregiver ability to obtain necessary supplies Assess patient/caregiver ability to perform ulcer/skin care regimen upon admission and as needed Assess ulceration(s) every visit Notes: Electronic Signature(s) Signed: 06/08/2018 5:05:35 PM By: Arnette NorrisBiell, Kristina Entered By: Arnette NorrisBiell, Kristina on 06/07/2018 14:25:18 Tony ClevelandKENNEDY, Felder ([id number]) -------------------------------------------------------------------------------- Pain Assessment Details Patient Name: Tony ClevelandKENNEDY, Dewayne Date of Service: 06/07/2018 1:45 PM Medical Record Number: [id number] Patient Account Number: [id number] Date of Birth/Sex: 09/07/1975 (42 y.o. M) Treating RN: Rema JasmineNg, Wendi Primary Care Alexi Dorminey: Raliegh IpSTROUD, NATALIE Other Clinician: Referring Neythan Kozlov: Raliegh IpSTROUD, NATALIE Treating Javier Mamone/Extender: Linwood DibblesSTONE III, HOYT Weeks in Treatment: 1 Active Problems Location of Pain Severity and Description of Pain Patient Has Paino No Site Locations Pain Management and Medication Current Pain Management: Notes pt denies any pain at this time. Electronic Signature(s) Signed: 06/07/2018 4:12:55 PM By: Rema JasmineNg, Wendi Entered By: Rema JasmineNg, Wendi on 06/07/2018 13:56:49 Tony ClevelandKENNEDY, Rakwon ([id number]) -------------------------------------------------------------------------------- Patient/Caregiver Education Details Patient Name: Tony ClevelandKENNEDY, Maven Date  of Service: 06/07/2018 1:45 PM Medical Record Number: [id number] Patient Account Number: [id number] Date of Birth/Gender: 09/27/1975 (42 y.o. M) Treating RN: Arnette NorrisBiell, Kristina Primary Care Physician: Raliegh IpSTROUD, NATALIE Other Clinician: Referring Physician: Raliegh IpSTROUD, NATALIE Treating Physician/Extender: Skeet SimmerSTONE III, HOYT Weeks in Treatment: 1 Education Assessment Education Provided To: Patient Education Topics Provided Electronic Signature(s) Signed: 06/08/2018 5:05:35 PM By: Arnette NorrisBiell, Kristina Entered By: Arnette NorrisBiell, Kristina on 06/07/2018 14:25:40 Tony ClevelandKENNEDY, Jarrah ([id number]) -------------------------------------------------------------------------------- Wound Assessment Details Patient Name: Tony ClevelandKENNEDY, Quadry Date of Service: 06/07/2018 1:45 PM Medical Record Number: [id number] Patient Account Number: [id number] Date of Birth/Sex: 06/29/1976 (42 y.o. M) Treating RN: Rema JasmineNg, Wendi Primary Care Mammie Meras: Raliegh IpSTROUD, NATALIE Other Clinician: Referring Elberta Lachapelle: Raliegh IpSTROUD, NATALIE Treating Ercil Cassis/Extender: STONE III, HOYT Weeks in Treatment: 1 Wound Status Wound Number: 1 Primary Etiology: Abscess Wound Location: Back - Midline, Proximal Wound Status: Open Wounding Event: Surgical Injury Comorbid History: Type II Diabetes Date Acquired: 03/21/2018 Weeks Of Treatment: 1 Clustered Wound: No Photos Photo Uploaded By: Rema JasmineNg, Wendi on 06/07/2018 16:05:27 Wound Measurements Length: (cm) 1.8 % Reducti Width: (cm) 0.7 % Reducti Depth: (cm) 0.7 Epithelia Area: (cm) 0.99 Tunnelin Volume: (cm) 0.693 Posit Maximu on in Area: 12.5% on in Volume: 12.5% lization: None g: Yes ion (o'clock): 12 m Distance: (cm) 3.8 Undermining: Yes Starting Position (o'clock): 7 Ending Position (  o'clock): 5 Maximum Distance: (cm) 1.7 Wound Description Full Thickness Without Exposed Support Foul Odor Classification: Structures Slough/Fi Wound Margin: Flat and Intact Exudate Medium Amount: Exudate Type: Serous Exudate  Color: amber After Cleansing: No brino Yes Wound Bed CRIXUS, MCAULAY ([id number]) Granulation Amount: Large (67-100%) Exposed Structure Granulation Quality: Red Fascia Exposed: No Necrotic Amount: Small (1-33%) Fat Layer (Subcutaneous Tissue) Exposed: Yes Necrotic Quality: Adherent Slough Tendon Exposed: No Muscle Exposed: No Joint Exposed: No Bone Exposed: No Periwound Skin Texture Texture Color No Abnormalities Noted: No No Abnormalities Noted: No Callus: No Atrophie Blanche: No Crepitus: No Cyanosis: No Excoriation: No Ecchymosis: No Induration: No Erythema: No Rash: No Hemosiderin Staining: No Scarring: Yes Mottled: No Pallor: No Moisture Rubor: No No Abnormalities Noted: No Dry / Scaly: No Temperature / Pain Maceration: No Temperature: No Abnormality Tenderness on Palpation: Yes Wound Preparation Ulcer Cleansing: Rinsed/Irrigated with Saline Topical Anesthetic Applied: Other: lidocaine 4%, Electronic Signature(s) Signed: 06/07/2018 4:12:55 PM By: Rema Jasmine Entered By: Rema Jasmine on 06/07/2018 14:10:44 Tony Long ([id number]) -------------------------------------------------------------------------------- Wound Assessment Details Patient Name: Tony Long Date of Service: 06/07/2018 1:45 PM Medical Record Number: [id number] Patient Account Number: [id number] Date of Birth/Sex: Apr 09, 1976 (42 y.o. M) Treating RN: Rema Jasmine Primary Care Ellissa Ayo: Raliegh Ip Other Clinician: Referring Louvenia Golomb: Raliegh Ip Treating Zian Mohamed/Extender: STONE III, HOYT Weeks in Treatment: 1 Wound Status Wound Number: 2 Primary Etiology: Abscess Wound Location: Back - Midline, Distal Wound Status: Open Wounding Event: Surgical Injury Comorbid History: Type II Diabetes Date Acquired: 03/21/2018 Weeks Of Treatment: 1 Clustered Wound: No Photos Photo Uploaded By: Rema Jasmine on 06/07/2018 16:05:27 Wound Measurements Length: (cm) 0.9 Width: (cm) 0.4 Depth:  (cm) 1.1 Area: (cm) 0.283 Volume: (cm) 0.311 % Reduction in Area: 0% % Reduction in Volume: 0% Epithelialization: None Tunneling: No Undermining: No Wound Description Full Thickness Without Exposed Support Foul Odor Classification: Structures Slough/Fi Wound Margin: Flat and Intact Exudate Small Amount: Exudate Type: Serous Exudate Color: amber After Cleansing: No brino Yes Wound Bed Granulation Amount: Small (1-33%) Exposed Structure Granulation Quality: Red Fascia Exposed: No Necrotic Amount: Small (1-33%) Fat Layer (Subcutaneous Tissue) Exposed: Yes Necrotic Quality: Adherent Slough Tendon Exposed: No Muscle Exposed: No Joint Exposed: No Bone Exposed: No KARTHIKEYA, FUNKE ([id number]) Periwound Skin Texture Texture Color No Abnormalities Noted: No No Abnormalities Noted: No Callus: No Atrophie Blanche: No Crepitus: No Cyanosis: No Excoriation: No Ecchymosis: No Induration: No Erythema: No Rash: No Hemosiderin Staining: No Scarring: Yes Mottled: No Pallor: No Moisture Rubor: No No Abnormalities Noted: No Dry / Scaly: No Temperature / Pain Maceration: No Temperature: No Abnormality Tenderness on Palpation: Yes Wound Preparation Ulcer Cleansing: Rinsed/Irrigated with Saline Topical Anesthetic Applied: Other: lidocaine 4%, Electronic Signature(s) Signed: 06/07/2018 4:12:55 PM By: Rema Jasmine Entered By: Rema Jasmine on 06/07/2018 14:11:13 Tony Long ([id number]) -------------------------------------------------------------------------------- Vitals Details Patient Name: Tony Long Date of Service: 06/07/2018 1:45 PM Medical Record Number: [id number] Patient Account Number: [id number] Date of Birth/Sex: Nov 16, 1975 (42 y.o. M) Treating RN: Rema Jasmine Primary Care Barlow Harrison: Raliegh Ip Other Clinician: Referring Linda Grimmer: Raliegh Ip Treating Geraldo Haris/Extender: STONE III, HOYT Weeks in Treatment: 1 Vital Signs Time Taken:  14:00 Temperature (F): 97.8 Height (in): 71 Pulse (bpm): 96 Weight (lbs): 155 Respiratory Rate (breaths/min): 16 Body Mass Index (BMI): 21.6 Blood Pressure (mmHg): 124/67 Reference Range: 80 - 120 mg / dl Electronic Signature(s) Signed: 06/07/2018 4:12:55 PM By: Rema Jasmine Entered ByRema Jasmine on 06/07/2018 13:59:58

## 2018-06-14 ENCOUNTER — Ambulatory Visit: Payer: Self-pay | Admitting: Physician Assistant

## 2018-09-07 ENCOUNTER — Ambulatory Visit (INDEPENDENT_AMBULATORY_CARE_PROVIDER_SITE_OTHER): Payer: Medicaid Other | Admitting: Family Medicine

## 2018-09-07 VITALS — BP 108/66 | HR 88 | Temp 97.9°F | Ht 71.0 in | Wt 146.0 lb

## 2018-09-07 DIAGNOSIS — G629 Polyneuropathy, unspecified: Secondary | ICD-10-CM | POA: Diagnosis not present

## 2018-09-07 DIAGNOSIS — Z794 Long term (current) use of insulin: Secondary | ICD-10-CM

## 2018-09-07 DIAGNOSIS — R8290 Unspecified abnormal findings in urine: Secondary | ICD-10-CM

## 2018-09-07 DIAGNOSIS — N390 Urinary tract infection, site not specified: Secondary | ICD-10-CM

## 2018-09-07 DIAGNOSIS — E119 Type 2 diabetes mellitus without complications: Secondary | ICD-10-CM | POA: Diagnosis not present

## 2018-09-07 DIAGNOSIS — Z09 Encounter for follow-up examination after completed treatment for conditions other than malignant neoplasm: Secondary | ICD-10-CM

## 2018-09-07 LAB — POCT URINALYSIS DIP (MANUAL ENTRY)
Bilirubin, UA: NEGATIVE
Blood, UA: NEGATIVE
Glucose, UA: 500 mg/dL — AB
Ketones, POC UA: NEGATIVE mg/dL
Nitrite, UA: POSITIVE — AB
Protein Ur, POC: NEGATIVE mg/dL
Spec Grav, UA: 1.015 (ref 1.010–1.025)
Urobilinogen, UA: 1 E.U./dL
pH, UA: 5.5 (ref 5.0–8.0)

## 2018-09-07 LAB — POCT GLYCOSYLATED HEMOGLOBIN (HGB A1C): Hemoglobin A1C: 14 % — AB (ref 4.0–5.6)

## 2018-09-07 LAB — GLUCOSE, POCT (MANUAL RESULT ENTRY): POC Glucose: 174 mg/dl — AB (ref 70–99)

## 2018-09-07 MED ORDER — SULFAMETHOXAZOLE-TRIMETHOPRIM 800-160 MG PO TABS: 1.0000 | ORAL_TABLET | Freq: Two times a day (BID) | ORAL | 0 refills | Status: DC

## 2018-09-07 NOTE — Progress Notes (Signed)
[facility]  Established Patient Office Visit  Subjective:  Patient ID: Tony Long, male    DOB: July 17, 1975  Age: 43 y.o. MRN: [id number]  CC:  Chief Complaint  Patient presents with  . Follow-up    chronic condition     HPI Tony Long is a 43 year old male who presents for Follow Up today.   Past Medical History:  Diagnosis Date  . Diabetes mellitus without complication (HCC)    Current Status: Since his last office visit, he is doing well with no complaints. He continue to follow up with Wound Care for abscess on back. He currently does not check preprandial blood glucose levels. He denies fatigue, frequent urination, blurred vision, excessive hunger, excessive thirst, weight gain, weight loss, and poor wound healing. He denies fevers, chills, fatigue, recent infections, weight loss, and night sweats. He has not had any headaches, visual changes, dizziness, and falls. No chest pain, heart palpitations, cough and shortness of breath reported. No reports of GI problems such as nausea, vomiting, diarrhea, and constipation. He has no reports of blood in stools, dysuria and hematuria. No depression or anxiety reported. He denies pain today.   Past Surgical History:  Procedure Laterality Date  . INCISION AND DRAINAGE ABSCESS N/A 03/22/2018   Procedure: INCISION AND DRAINAGE ABSCESS;  Surgeon: Violeta Gelinas, MD;  Location: [facility] OR;  Service: General;  Laterality: N/A;  . TEE WITHOUT CARDIOVERSION  03/22/2018   Procedure: TRANSESOPHAGEAL ECHOCARDIOGRAM (TEE);  Surgeon: Radiologist, Medication, MD;  Location: [facility] OR;  Service: Open Heart Surgery;;    Family History  Problem Relation Age of Onset  . Heart disease Father   . Diabetes Father   . Diabetes Paternal Uncle     Social History   Socioeconomic History  . Marital status: Single    Spouse name: Not on file  . Number of children: Not on file  . Years of education: Not  on file  . Highest education level: Not on file  Occupational History  . Not on file  Social Needs  . Financial resource strain: Not on file  . Food insecurity:    Worry: Not on file    Inability: Not on file  . Transportation needs:    Medical: Not on file    Non-medical: Not on file  Tobacco Use  . Smoking status: Current Every Day Smoker    Packs/day: 1.00  . Smokeless tobacco: Never Used  Substance and Sexual Activity  . Alcohol use: Not Currently    Comment: seldom  . Drug use: No    Comment: pt mother said hx of use  . Sexual activity: Yes    Partners: Female  Lifestyle  . Physical activity:    Days per week: Not on file    Minutes per session: Not on file  . Stress: Not on file  Relationships  . Social connections:    Talks on phone: Not on file    Gets together: Not on file    Attends religious service: Not on file    Active member of club or organization: Not on file    Attends meetings of clubs or organizations: Not on file    Relationship status: Not on file  . Intimate partner violence:    Fear of current or ex partner: Not on file    Emotionally abused: Not on file    Physically abused: Not on file    Forced sexual activity:  Not on file  Other Topics Concern  . Not on file  Social History Narrative  . Not on file    Outpatient Medications Prior to Visit  Medication Sig Dispense Refill  . Insulin Glargine (LANTUS) 100 UNIT/ML Solostar Pen Inject 50 Units into the skin daily at 10 pm. (Patient taking differently: Inject 40 Units into the skin at bedtime. ) 45 mL 3  . insulin lispro (HUMALOG) 100 UNIT/ML injection Inject 0.1 mLs (10 Units total) into the skin 3 (three) times daily with meals. 60 mL 1  . gabapentin (NEURONTIN) 300 MG capsule Take 1 capsule (300 mg total) by mouth 3 (three) times daily. 90 capsule 6  . cyclobenzaprine (FLEXERIL) 5 MG tablet Take 1-2 tablets (5-10 mg total) by mouth 3 (three) times daily as needed for muscle spasms. (Patient  not taking: Reported on 09/07/2018) 20 tablet 0  . etodolac (LODINE) 500 MG tablet Take 1 tablet (500 mg total) by mouth 2 (two) times daily. (Patient not taking: Reported on 09/07/2018) 30 tablet 0  . glucose blood test strip Use as instructed (Patient not taking: Reported on 09/07/2018) 100 each 12  . HYDROcodone-acetaminophen (NORCO) 5-325 MG tablet Take 1 tablet by mouth every 6 (six) hours as needed for moderate pain. (Patient not taking: Reported on 09/07/2018) 15 tablet 0  . ibuprofen (ADVIL,MOTRIN) 600 MG tablet Take 1 tablet (600 mg total) by mouth every 8 (eight) hours as needed. (Patient not taking: Reported on 09/07/2018) 30 tablet 0  . Lancets (ONETOUCH ULTRASOFT) lancets Use as instructed (Patient not taking: Reported on 09/07/2018) 100 each 12  . LANTUS 100 UNIT/ML injection INJECT 40 UNITS TOTAL INTO THE SKIN AT BEDTIME. (Patient not taking: Reported on 09/07/2018) 10 mL 0  . lisinopril (PRINIVIL,ZESTRIL) 5 MG tablet Take 1 tablet (5 mg total) by mouth daily. (Patient not taking: Reported on 09/07/2018) 90 tablet 3  . metFORMIN (GLUCOPHAGE) 500 MG tablet Take 1 tablet (500 mg total) by mouth 2 (two) times daily with a meal. (Patient not taking: Reported on 09/07/2018) 180 tablet 3  . metoprolol tartrate (LOPRESSOR) 50 MG tablet Take 1 tablet (50 mg total) by mouth 2 (two) times daily. (Patient not taking: Reported on 09/07/2018) 60 tablet 0  . potassium chloride 20 MEQ TBCR Take 20 mEq by mouth daily. (Patient not taking: Reported on 09/07/2018) 30 tablet 0  . potassium chloride SA (K-DUR,KLOR-CON) 20 MEQ tablet Take 1 tablet by mouth daily.  0  . pravastatin (PRAVACHOL) 40 MG tablet Take 1 tablet (40 mg total) by mouth daily. (Patient not taking: Reported on 09/07/2018) 90 tablet 3  . sildenafil (VIAGRA) 25 MG tablet Take 1 tablet (25 mg total) by mouth daily as needed for erectile dysfunction. (Patient not taking: Reported on 09/07/2018) 90 tablet 3  . tamsulosin (FLOMAX) 0.4 MG CAPS capsule Take  1 capsule (0.4 mg total) by mouth daily after supper. (Patient not taking: Reported on 09/07/2018) 30 capsule 0   No facility-administered medications prior to visit.     No Known Allergies  ROS Review of Systems  Constitutional: Negative.   HENT: Negative.   Eyes: Negative.   Respiratory: Negative.   Cardiovascular: Negative.   Gastrointestinal: Negative.   Endocrine: Negative.   Genitourinary: Negative.   Musculoskeletal: Negative.   Skin: Negative.   Allergic/Immunologic: Negative.   Neurological: Positive for numbness (numbness bilaterally in first 3 toes).  Hematological: Negative.   Psychiatric/Behavioral: Negative.    Objective:    Physical Exam  Constitutional: He  is oriented to person, place, and time. He appears well-developed and well-nourished.    HENT:  Head: Normocephalic and atraumatic.  Eyes: Conjunctivae are normal.  Neck: Normal range of motion. Neck supple.  Cardiovascular: Normal rate, regular rhythm, normal heart sounds and intact distal pulses.  Pulmonary/Chest: Effort normal and breath sounds normal.  Abdominal: Soft. Bowel sounds are normal.  Musculoskeletal: Normal range of motion.  Neurological: He is alert and oriented to person, place, and time.  Skin: Skin is warm and dry.  Psychiatric: He has a normal mood and affect. His behavior is normal. Judgment and thought content normal.  Nursing note and vitals reviewed.   BP 108/66 (BP Location: Left Arm, Patient Position: Sitting, Cuff Size: Small)   Pulse 88   Temp 97.9 F (36.6 C) (Oral)   Ht 5\' 11"  (1.803 m)   Wt 146 lb (66.2 kg)   SpO2 99%   BMI 20.36 kg/m  Wt Readings from Last 3 Encounters:  09/07/18 146 lb (66.2 kg)  05/17/18 150 lb (68 kg)  04/27/18 154 lb (69.9 kg)     Health Maintenance Due  Topic Date Due  . PNEUMOCOCCAL POLYSACCHARIDE VACCINE AGE 19-64 HIGH RISK  06/06/1978  . OPHTHALMOLOGY EXAM  06/06/1986    There are no preventive care reminders to display for this  patient.  Lab Results  Component Value Date   TSH 3.62 12/11/2016   Lab Results  Component Value Date   WBC 7.2 04/17/2018   HGB 8.1 (L) 04/17/2018   HCT 28.3 (L) 04/17/2018   MCV 96.9 04/17/2018   PLT 397 04/17/2018   Lab Results  Component Value Date   NA 136 04/17/2018   K 4.0 04/17/2018   CO2 28 04/17/2018   GLUCOSE 351 (H) 04/17/2018   BUN 6 04/17/2018   CREATININE 0.76 04/17/2018   BILITOT 0.7 04/02/2018   ALKPHOS 140 (H) 04/02/2018   AST 22 04/02/2018   ALT 14 04/02/2018   PROT 6.5 04/02/2018   ALBUMIN 1.5 (L) 04/02/2018   CALCIUM 8.2 (L) 04/17/2018   ANIONGAP 8 04/17/2018   Lab Results  Component Value Date   CHOL 74 03/31/2018   Lab Results  Component Value Date   HDL 23 (L) 03/31/2018   Lab Results  Component Value Date   LDLCALC 41 03/31/2018   Lab Results  Component Value Date   TRIG 48 03/31/2018   Lab Results  Component Value Date   CHOLHDL 3.2 03/31/2018   Lab Results  Component Value Date   HGBA1C 14.0 (A) 09/07/2018      Assessment & Plan:   1. Type 2 diabetes mellitus without complication, with long-term current use of insulin (HCC) Hgb A1c is increased at 14.0 today, from 12.6 on 02/26/2018. Continue medications as prescribed. He will continue to decrease foods/beverages high in sugars and carbs and follow Heart Healthy or DASH diet. Increase physical activity to at least 30 minutes cardio exercise daily.  - POCT glycosylated hemoglobin (Hb A1C) - POCT urinalysis dipstick - POCT glucose (manual entry) - HM Diabetes Foot Exam  2. Neuropathy We will increase dosage of Gabapentin to 600 mg BID.  - gabapentin (NEURONTIN) 300 MG capsule; Take 1 capsule (300 mg total) by mouth 2 (two) times daily.  Dispense: 120 capsule; Refill: 3  3. Diabetic foot exam Decreased sensation bilaterally in first 3 toes of each foot. Foot exam tolerated well. Decreased sensitivity noted bilaterally in first 3 toes upon foot exam. Patient counseled on  proper foot  hygiene. She is encouraged to exam feet often (daily), using mirror if necessary; keep feet clean and dry (especially between toes), keep feet moistened, wear cotton socks, and avoid wearing open-toed shoes, high-heel shoes, and sandals. Patient verbalized understanding.   4. Abnormal urinalysis Results are pending.  - Urine Culture  5. Urinary tract infection without hematuria, site unspecified We will initiate Septra today.  - sulfamethoxazole-trimethoprim (BACTRIM DS,SEPTRA DS) 800-160 MG tablet; Take 1 tablet by mouth 2 (two) times daily.  Dispense: 14 tablet; Refill: 0  6. Follow up He will follow up in 2 months.   Meds ordered this encounter  Medications  . sulfamethoxazole-trimethoprim (BACTRIM DS,SEPTRA DS) 800-160 MG tablet    Sig: Take 1 tablet by mouth 2 (two) times daily.    Dispense:  14 tablet    Refill:  0  . gabapentin (NEURONTIN) 300 MG capsule    Sig: Take 1 capsule (300 mg total) by mouth 2 (two) times daily.    Dispense:  120 capsule    Refill:  3    Orders Placed This Encounter  Procedures  . Urine Culture  . POCT glycosylated hemoglobin (Hb A1C)  . POCT urinalysis dipstick  . POCT glucose (manual entry)  . HM Diabetes Foot Exam    Referral Orders  No referral(s) requested today    Raliegh Ip,  MSN, FNP-C [facility] [address] [place], [place] [address] [phone]   Problem List Items Addressed This Visit      Endocrine   Diabetes [facility]) - Primary   Relevant Orders   POCT glycosylated hemoglobin (Hb A1C) (Completed)   POCT urinalysis dipstick (Completed)   POCT glucose (manual entry) (Completed)   HM Diabetes Foot Exam (Completed)    Other Visit Diagnoses    Neuropathy       Relevant Medications   gabapentin (NEURONTIN) 300 MG capsule   Abnormal urinalysis       Relevant Orders   Urine Culture (Completed)   Urinary tract infection without hematuria, site unspecified        Relevant Medications   sulfamethoxazole-trimethoprim (BACTRIM DS,SEPTRA DS) 800-160 MG tablet   Follow up          Meds ordered this encounter  Medications  . sulfamethoxazole-trimethoprim (BACTRIM DS,SEPTRA DS) 800-160 MG tablet    Sig: Take 1 tablet by mouth 2 (two) times daily.    Dispense:  14 tablet    Refill:  0  . gabapentin (NEURONTIN) 300 MG capsule    Sig: Take 1 capsule (300 mg total) by mouth 2 (two) times daily.    Dispense:  120 capsule    Refill:  3    Follow-up: Return in about 2 months (around 11/07/2018).    [name], FNP

## 2018-09-07 NOTE — Patient Instructions (Signed)
Urinary Tract Infection, Adult A urinary tract infection (UTI) is an infection of any part of the urinary tract. The urinary tract includes:  The kidneys.  The ureters.  The bladder.  The urethra. These organs make, store, and get rid of pee (urine) in the body. What are the causes? This is caused by germs (bacteria) in your genital area. These germs grow and cause swelling (inflammation) of your urinary tract. What increases the risk? You are more likely to develop this condition if:  You have a small, thin tube (catheter) to drain pee.  You cannot control when you pee or poop (incontinence).  You are male, and: ? You use these methods to prevent pregnancy: ? A medicine that kills sperm (spermicide). ? A device that blocks sperm (diaphragm). ? You have low levels of a male hormone (estrogen). ? You are pregnant.  You have genes that add to your risk.  You are sexually active.  You take antibiotic medicines.  You have trouble peeing because of: ? A prostate that is bigger than normal, if you are male. ? A blockage in the part of your body that drains pee from the bladder (urethra). ? A kidney stone. ? A nerve condition that affects your bladder (neurogenic bladder). ? Not getting enough to drink. ? Not peeing often enough.  You have other conditions, such as: ? Diabetes. ? A weak disease-fighting system (immune system). ? Sickle cell disease. ? Gout. ? Injury of the spine. What are the signs or symptoms? Symptoms of this condition include:  Needing to pee right away (urgently).  Peeing often.  Peeing small amounts often.  Pain or burning when peeing.  Blood in the pee.  Pee that smells bad or not like normal.  Trouble peeing.  Pee that is cloudy.  Fluid coming from the vagina, if you are male.  Pain in the belly or lower back. Other symptoms include:  Throwing up (vomiting).  No urge to eat.  Feeling mixed up (confused).  Being tired  and grouchy (irritable).  A fever.  Watery poop (diarrhea). How is this treated? This condition may be treated with:  Antibiotic medicine.  Other medicines.  Drinking enough water. Follow these instructions at home:  Medicines  Take over-the-counter and prescription medicines only as told by your doctor.  If you were prescribed an antibiotic medicine, take it as told by your doctor. Do not stop taking it even if you start to feel better. General instructions  Make sure you: ? Pee until your bladder is empty. ? Do not hold pee for a long time. ? Empty your bladder after sex. ? Wipe from front to back after pooping if you are a male. Use each tissue one time when you wipe.  Drink enough fluid to keep your pee pale yellow.  Keep all follow-up visits as told by your doctor. This is important. Contact a doctor if:  You do not get better after 1-2 days.  Your symptoms go away and then come back. Get help right away if:  You have very bad back pain.  You have very bad pain in your lower belly.  You have a fever.  You are sick to your stomach (nauseous).  You are throwing up. Summary  A urinary tract infection (UTI) is an infection of any part of the urinary tract.  This condition is caused by germs in your genital area.  There are many risk factors for a UTI. These include having a small, thin   tube to drain pee and not being able to control when you pee or poop.  Treatment includes antibiotic medicines for germs.  Drink enough fluid to keep your pee pale yellow. This information is not intended to replace advice given to you by your health care provider. Make sure you discuss any questions you have with your health care provider. Document Released: 12/03/2007 Document Revised: 12/24/2017 Document Reviewed: 12/24/2017 Elsevier Interactive Patient Education  2019 [employer]. Sulfamethoxazole; Trimethoprim, SMX-TMP tablets What is this medicine?  SULFAMETHOXAZOLE; TRIMETHOPRIM or SMX-TMP (suhl fuh meth OK suh zohl; trye METH oh prim) is a combination of a sulfonamide antibiotic and a second antibiotic, trimethoprim. It is used to treat or prevent certain kinds of bacterial infections. It will not work for colds, flu, or other viral infections. This medicine may be used for other purposes; ask your health care provider or pharmacist if you have questions. COMMON BRAND NAME(S): Bacter-Aid DS, Bactrim, Bactrim DS, Septra, Septra DS What should I tell my health care provider before I take this medicine? They need to know if you have any of these conditions: -anemia -asthma -being treated with anticonvulsants -if you frequently drink alcohol containing drinks -kidney disease -liver disease -low level of folic acid or glucose-6-phosphate dehydrogenase -poor nutrition or malabsorption -porphyria -severe allergies -thyroid disorder -an unusual or allergic reaction to sulfamethoxazole, trimethoprim, sulfa drugs, other medicines, foods, dyes, or preservatives -pregnant or trying to get pregnant -breast-feeding How should I use this medicine? Take this medicine by mouth with a full glass of water. Follow the directions on the prescription label. Take your medicine at regular intervals. Do not take it more often than directed. Do not skip doses or stop your medicine early. Talk to your pediatrician regarding the use of this medicine in children. Special care may be needed. This medicine has been used in children as young as 2 months of age. Overdosage: If you think you have taken too much of this medicine contact a poison control center or emergency room at once. NOTE: This medicine is only for you. Do not share this medicine with others. What if I miss a dose? If you miss a dose, take it as soon as you can. If it is almost time for your next dose, take only that dose. Do not take double or extra doses. What may interact with this medicine?  Do not take this medicine with any of the following medications: -aminobenzoate potassium -dofetilide -metronidazole This medicine may also interact with the following medications: -ACE inhibitors like benazepril, enalapril, lisinopril, and ramipril -birth control pills -cyclosporine -digoxin -diuretics -indomethacin -medicines for diabetes -methenamine -methotrexate -phenytoin -potassium supplements -pyrimethamine -sulfinpyrazone -tricyclic antidepressants -warfarin This list may not describe all possible interactions. Give your health care provider a list of all the medicines, herbs, non-prescription drugs, or dietary supplements you use. Also tell them if you smoke, drink alcohol, or use illegal drugs. Some items may interact with your medicine. What should I watch for while using this medicine? Tell your doctor or health care professional if your symptoms do not improve. Drink several glasses of water a day to reduce the risk of kidney problems. Do not treat diarrhea with over the counter products. Contact your doctor if you have diarrhea that lasts more than 2 days or if it is severe and watery. This medicine can make you more sensitive to the sun. Keep out of the sun. If you cannot avoid being in the sun, wear protective clothing and use a sunscreen. Do   not use sun lamps or tanning beds/booths. What side effects may I notice from receiving this medicine? Side effects that you should report to your doctor or health care professional as soon as possible: -allergic reactions like skin rash or hives, swelling of the face, lips, or tongue -breathing problems -fever or chills, sore throat -irregular heartbeat, chest pain -joint or muscle pain -pain or difficulty passing urine -red pinpoint spots on skin -redness, blistering, peeling or loosening of the skin, including inside the mouth -unusual bleeding or bruising -unusually weak or tired -yellowing of the eyes or skin Side  effects that usually do not require medical attention (report to your doctor or health care professional if they continue or are bothersome): -diarrhea -dizziness -headache -loss of appetite -nausea, vomiting -nervousness This list may not describe all possible side effects. Call your doctor for medical advice about side effects. You may report side effects to FDA at 1-800-FDA-1088. Where should I keep my medicine? Keep out of the reach of children. Store at room temperature between 20 to 25 degrees C (68 to 77 degrees F). Protect from light. Throw away any unused medicine after the expiration date. NOTE: This sheet is a summary. It may not cover all possible information. If you have questions about this medicine, talk to your doctor, pharmacist, or health care provider.  2019 Elsevier/Gold Standard (2013-01-21 14:38:26)  

## 2018-09-08 MED ORDER — GABAPENTIN 300 MG PO CAPS: 300.0000 mg | ORAL_CAPSULE | Freq: Two times a day (BID) | ORAL | 3 refills | Status: DC

## 2018-09-10 LAB — URINE CULTURE

## 2018-09-11 ENCOUNTER — Encounter: Payer: Self-pay | Admitting: Family Medicine

## 2018-09-20 ENCOUNTER — Encounter: Payer: Medicaid Other | Attending: Physician Assistant | Admitting: Physician Assistant

## 2018-09-20 ENCOUNTER — Other Ambulatory Visit
Admission: RE | Admit: 2018-09-20 | Discharge: 2018-09-20 | Disposition: A | Discharge status: Home / Self Care | Payer: Medicaid Other | Source: Ambulatory Visit | Attending: Physician Assistant | Admitting: Physician Assistant

## 2018-09-20 ENCOUNTER — Other Ambulatory Visit: Payer: Self-pay

## 2018-09-20 DIAGNOSIS — B182 Chronic viral hepatitis C: Secondary | ICD-10-CM | POA: Diagnosis not present

## 2018-09-20 DIAGNOSIS — B999 Unspecified infectious disease: Secondary | ICD-10-CM | POA: Insufficient documentation

## 2018-09-20 DIAGNOSIS — F1120 Opioid dependence, uncomplicated: Secondary | ICD-10-CM | POA: Diagnosis not present

## 2018-09-20 DIAGNOSIS — Z833 Family history of diabetes mellitus: Secondary | ICD-10-CM | POA: Diagnosis not present

## 2018-09-20 DIAGNOSIS — I10 Essential (primary) hypertension: Secondary | ICD-10-CM | POA: Insufficient documentation

## 2018-09-20 DIAGNOSIS — Z8249 Family history of ischemic heart disease and other diseases of the circulatory system: Secondary | ICD-10-CM | POA: Insufficient documentation

## 2018-09-20 DIAGNOSIS — F17200 Nicotine dependence, unspecified, uncomplicated: Secondary | ICD-10-CM | POA: Diagnosis not present

## 2018-09-20 DIAGNOSIS — L02212 Cutaneous abscess of back [any part, except buttock]: Secondary | ICD-10-CM | POA: Diagnosis not present

## 2018-09-20 DIAGNOSIS — H409 Unspecified glaucoma: Secondary | ICD-10-CM | POA: Insufficient documentation

## 2018-09-20 DIAGNOSIS — Z809 Family history of malignant neoplasm, unspecified: Secondary | ICD-10-CM | POA: Insufficient documentation

## 2018-09-20 DIAGNOSIS — E11622 Type 2 diabetes mellitus with other skin ulcer: Secondary | ICD-10-CM | POA: Diagnosis not present

## 2018-09-20 DIAGNOSIS — L98492 Non-pressure chronic ulcer of skin of other sites with fat layer exposed: Secondary | ICD-10-CM | POA: Diagnosis not present

## 2018-09-20 DIAGNOSIS — T8189XA Other complications of procedures, not elsewhere classified, initial encounter: Secondary | ICD-10-CM | POA: Diagnosis not present

## 2018-09-20 NOTE — Progress Notes (Signed)
Tony Long, Tony Long ([id number]) Visit Report for 09/20/2018 Abuse/Suicide Risk Screen Details Patient Name: Tony Long, Tony Long Date of Service: 09/20/2018 10:15 AM Medical Record Number: [id number] Patient Account Number: [id number] Date of Birth/Sex: February 01, 1976 (43 y.o. M) Treating RN: Curtis Sites Primary Care Ayaat Jansma: Raliegh Ip Other Clinician: Referring Alfred Harrel: Referral, Self Treating Feven Alderfer/Extender: STONE III, HOYT Weeks in Treatment: 0 Abuse/Suicide Risk Screen Items Answer ABUSE/SUICIDE RISK SCREEN: Has anyone close to you tried to hurt or harm you recentlyo No Do you feel uncomfortable with anyone in your familyo No Has anyone forced you do things that you didnot want to doo No Do you have any thoughts of harming yourselfo No Patient displays signs or symptoms of abuse and/or neglect. No Electronic Signature(s) Signed: 09/20/2018 3:31:49 PM By: Curtis Sites Entered By: Curtis Sites on 09/20/2018 10:38:09 Tony Long ([id number]) -------------------------------------------------------------------------------- Activities of Daily Living Details Patient Name: Tony Long Date of Service: 09/20/2018 10:15 AM Medical Record Number: [id number] Patient Account Number: [id number] Date of Birth/Sex: [birth year] (43 y.o. M) Treating RN: Curtis Sites Primary Care Novi Calia: Raliegh Ip Other Clinician: Referring Thy Gullikson: Referral, Self Treating Ludene Stokke/Extender: STONE III, HOYT Weeks in Treatment: 0 Activities of Daily Living Items Answer Activities of Daily Living (Please select one for each item) Drive Automobile Completely Able Take Medications Completely Able Use Telephone Completely Able Care for Appearance Completely Able Use Toilet Completely Able Bath / Shower Completely Able Dress Self Completely Able Feed Self Completely Able Walk Completely Able Get In / Out Bed Completely Able Housework Completely Able Prepare Meals Completely  Able Handle Money Completely Able Shop for Self Completely Able Electronic Signature(s) Signed: 09/20/2018 3:31:49 PM By: Curtis Sites Entered By: Curtis Sites on 09/20/2018 10:38:27 Tony Long ([id number]) -------------------------------------------------------------------------------- Education Screening Details Patient Name: Tony Long Date of Service: 09/20/2018 10:15 AM Medical Record Number: [id number] Patient Account Number: [id number] Date of Birth/Sex: August 16, 1975 (43 y.o. M) Treating RN: Curtis Sites Primary Care Breckin Savannah: Raliegh Ip Other Clinician: Referring Nataly Pacifico: Referral, Self Treating Tieshia Rettinger/Extender: Linwood Dibbles, HOYT Weeks in Treatment: 0 Primary Learner Assessed: Patient Learning Preferences/Education Level/Primary Language Learning Preference: Explanation, Demonstration Highest Education Level: College or Above Preferred Language: English Cognitive Barrier Language Barrier: No Translator Needed: No Memory Deficit: No Emotional Barrier: No Cultural/Religious Beliefs Affecting Medical Care: No Physical Barrier Impaired Vision: No Impaired Hearing: No Decreased Hand dexterity: No Knowledge/Comprehension Knowledge Level: Medium Comprehension Level: Medium Ability to understand written Medium instructions: Ability to understand verbal Medium instructions: Motivation Anxiety Level: Calm Cooperation: Cooperative Education Importance: Acknowledges Need Interest in Health Problems: Asks Questions Perception: Coherent Willingness to Engage in Self- Medium Management Activities: Readiness to Engage in Self- Medium Management Activities: Electronic Signature(s) Signed: 09/20/2018 3:31:49 PM By: Curtis Sites Entered By: Curtis Sites on 09/20/2018 10:39:04 Tony Long ([id number]) -------------------------------------------------------------------------------- Fall Risk Assessment Details Patient Name: Tony Long Date of Service: 09/20/2018 10:15 AM Medical Record Number: [id number] Patient Account Number: [id number] Date of Birth/Sex: 1975/08/27 (43 y.o. M) Treating RN: Curtis Sites Primary Care Treyvone Chelf: Raliegh Ip Other Clinician: Referring Bertice Risse: Referral, Self Treating Alegria Dominique/Extender: Linwood Dibbles, HOYT Weeks in Treatment: 0 Fall Risk Assessment Items Have you had 2 or more falls in the last 12 monthso 0 No Have you had any fall that resulted in injury in the last 12 monthso 0 No FALL RISK ASSESSMENT: History of falling - immediate or within 3 months 0 No Secondary diagnosis 0 No Ambulatory aid None/bed rest/wheelchair/nurse 0 Yes Crutches/cane/walker 0 No Furniture 0 No IV Access/Saline Lock 0 No Gait/Training Normal/bed rest/immobile 0 Yes  Weak 0 No Impaired 0 No Mental Status Oriented to own ability 0 Yes Electronic Signature(s) Signed: 09/20/2018 3:31:49 PM By: Curtis Sites Entered By: Curtis Sites on 09/20/2018 10:39:16 Tony Long ([id number]) -------------------------------------------------------------------------------- Foot Assessment Details Patient Name: Tony Long Date of Service: 09/20/2018 10:15 AM Medical Record Number: [id number] Patient Account Number: [id number] Date of Birth/Sex: 1975-07-16 (43 y.o. M) Treating RN: Curtis Sites Primary Care Yoshiaki Kreuser: Raliegh Ip Other Clinician: Referring Brylee Mcgreal: Referral, Self Treating Linas Stepter/Extender: STONE III, HOYT Weeks in Treatment: 0 Foot Assessment Items Site Locations + = Sensation present, - = Sensation absent, C = Callus, U = Ulcer R = Redness, W = Warmth, M = Maceration, PU = Pre-ulcerative lesion F = Fissure, S = Swelling, D = Dryness Assessment Right: Left: Other Deformity: No No Prior Foot Ulcer: No No Prior Amputation: No No Charcot Joint: No No Ambulatory Status: Ambulatory Without Help Gait: Steady Electronic Signature(s) Signed: 09/20/2018 3:31:49 PM By:  Curtis Sites Entered By: Curtis Sites on 09/20/2018 10:39:37 Tony Long ([id number]) -------------------------------------------------------------------------------- Nutrition Risk Screening Details Patient Name: Tony Long Date of Service: 09/20/2018 10:15 AM Medical Record Number: [id number] Patient Account Number: [id number] Date of Birth/Sex: September 10, 1975 (43 y.o. M) Treating RN: Curtis Sites Primary Care Shamica Moree: Raliegh Ip Other Clinician: Referring Amberly Livas: Referral, Self Treating Karrine Kluttz/Extender: STONE III, HOYT Weeks in Treatment: 0 Height (in): 71 Weight (lbs): 155 Body Mass Index (BMI): 21.6 Nutrition Risk Screening Items Score Screening NUTRITION RISK SCREEN: I have an illness or condition that made me change the kind and/or amount of 0 No food I eat I eat fewer than two meals per day 0 No I eat few fruits and vegetables, or milk products 0 No I have three or more drinks of beer, liquor or wine almost every day 0 No I have tooth or mouth problems that make it hard for me to eat 0 No I don't always have enough money to buy the food I need 0 No I eat alone most of the time 0 No I take three or more different prescribed or over-the-counter drugs a day 1 Yes Without wanting to, I have lost or gained 10 pounds in the last six months 0 No I am not always physically able to shop, cook and/or feed myself 0 No Nutrition Protocols Good Risk Protocol 0 No interventions needed Moderate Risk Protocol High Risk Proctocol Risk Level: Good Risk Score: 1 Electronic Signature(s) Signed: 09/20/2018 3:31:49 PM By: Curtis Sites Entered By: Curtis Sites on 09/20/2018 10:39:28

## 2018-09-22 LAB — AEROBIC CULTURE W GRAM STAIN (SUPERFICIAL SPECIMEN)

## 2018-09-23 NOTE — Progress Notes (Addendum)
Tony Long ([id number]) Visit Report for 09/20/2018 Allergy List Details Patient Name: Tony Long, Tony Long Date of Service: 09/20/2018 10:15 AM Medical Record Number: [id number] Patient Account Number: [id number] Date of Birth/Sex: 1976-04-05 (43 y.o. M) Treating RN: Tony Long Primary Care Muriel Wilber: Tony Long Other Clinician: Referring Tony Long: Referral, Self Treating Tony Long: Tony III, Tony Long: 0 Allergies Active Allergies No Known Drug Allergies Allergy Notes Electronic Signature(s) Signed: 09/20/2018 3:31:49 PM By: Tony Long Entered By: Tony Long on 09/20/2018 10:33:55 Tony Long ([id number]) -------------------------------------------------------------------------------- Arrival Information Details Patient Name: Tony Long Date of Service: 09/20/2018 10:15 AM Medical Record Number: [id number] Patient Account Number: [id number] Date of Birth/Sex: 1976-05-12 (42 y.o. M) Treating RN: Tony Long Primary Care Nasim Habeeb: Tony Long Other Clinician: Referring Swan Fairfax: Referral, Self Treating Tony Long: Tony Long, Tony Long: 0 Visit Information Patient Arrived: Ambulatory Arrival Time: 10:32 Accompanied By: self Transfer Assistance: None Patient Identification Verified: Yes Secondary Verification Process Completed: Yes History Since Last Visit Added or deleted any medications: No Any new allergies or adverse reactions: No Had a fall or experienced change in activities of daily living that may affect risk of falls: No Signs or symptoms of abuse/neglect since last visito No Hospitalized since last visit: No Implantable device outside of the clinic excluding cellular tissue based products placed in the center since last visit: No Has Dressing in Place as Prescribed: Yes Electronic Signature(s) Signed: 09/20/2018 3:31:49 PM By: Tony Long Entered By: Tony Long on 09/20/2018  10:33:09 Tony Long ([id number]) -------------------------------------------------------------------------------- Encounter Discharge Information Details Patient Name: Tony Long Date of Service: 09/20/2018 10:15 AM Medical Record Number: [id number] Patient Account Number: [id number] Date of Birth/Sex: [birth year] (42 y.o. M) Treating RN: Tony Long Primary Care Amontae Ng: Tony Long Other Clinician: Referring Keoki Mchargue: Referral, Self Treating Renalda Locklin/Extender: Tony Long, Tony Long: 0 Encounter Discharge Information Items Post Procedure Vitals Discharge Condition: Stable Temperature (F): 98.0 Ambulatory Status: Ambulatory Pulse (bpm): 81 Discharge Destination: Home Respiratory Rate (breaths/min): 16 Transportation: Private Auto Blood Pressure (mmHg): 140/86 Accompanied By: self Schedule Follow-up Appointment: Yes Clinical Summary of Care: Electronic Signature(s) Signed: 09/20/2018 11:56:50 AM By: Tony Long Entered By: Tony Long on 09/20/2018 11:29:23 Tony Long ([id number]) -------------------------------------------------------------------------------- Lower Extremity Assessment Details Patient Name: Tony Long Date of Service: 09/20/2018 10:15 AM Medical Record Number: [id number] Patient Account Number: [id number] Date of Birth/Sex: 1976-05-09 (42 y.o. M) Treating RN: Tony Long Primary Care Tony Long: Tony Long Other Clinician: Referring Tony Long: Referral, Self Treating Tony Long: Tony Long, Tony Long: 0 Electronic Signature(s) Signed: 09/20/2018 3:31:49 PM By: Tony Long Entered By: Tony Long on 09/20/2018 10:33:44 Tony Long ([id number]) -------------------------------------------------------------------------------- Multi Wound Chart Details Patient Name: Tony Long Date of Service: 09/20/2018 10:15 AM Medical Record Number: [id number] Patient Account Number:  [id number] Date of Birth/Sex: 27-Feb-1976 (43 y.o. M) Treating RN: Tony Long Primary Care Tony Long: Tony Long Other Clinician: Referring Tony Long: Referral, Self Treating Tony Long: Tony III, Tony Long: 0 Vital Signs Height(in): 71 Pulse(bpm): 81 Weight(lbs): 150 Blood Pressure(mmHg): 140/86 Body Mass Index(BMI): 21 Temperature(F): 98.0 Respiratory Rate 18 (breaths/min): Photos: [3:No Photos] [4:No Photos] [5:No Photos] Wound Location: [3:Proximal Back] [4:Back - Distal] [5:Right Abdomen - Lower Quadrant] Wounding Event: [3:Surgical Injury] [4:Surgical Injury] [5:Gradually Appeared] Primary Etiology: [3:Open Surgical Wound] [4:Open Surgical Wound] [5:To be determined] Comorbid History: [3:Type II Diabetes] [4:Type II Diabetes] [5:Type II Diabetes] Date Acquired: [3:03/21/2018] [4:03/21/2018] [5:08/23/2018] Weeks of Long: [3:0] [4:0] [5:0] Wound Status: [3:Open] [4:Open] [5:Open] Measurements L x W x D [3:10.5x0.2x0.1] [4:0.8x0.2x0.1] [5:4.4x3.6x0.1] (cm) Area (  cm) : [3:1.649] [4:0.126] [5:12.441] Volume (cm) : [3:0.165] [4:0.013] [5:1.244] % Reduction in Area: [3:-1987.30%] [4:N/A] [5:N/A] % Reduction in Volume: [3:-1962.50%] [4:N/A] [5:N/A] Classification: [3:Full Thickness Without Exposed Support Structures] [4:Full Thickness Without Exposed Support Structures] [5:Full Thickness Without Exposed Support Structures] Exudate Amount: [3:None Present] [4:None Present] [5:Medium] Exudate Type: [3:N/A] [4:N/A] [5:Purulent] Exudate Color: [3:N/A] [4:N/A] [5:yellow, brown, green] Wound Margin: [3:Flat and Intact] [4:Flat and Intact] [5:Flat and Intact] Granulation Amount: [3:Small (1-33%)] [4:Small (1-33%)] [5:Small (1-33%)] Granulation Quality: [3:Pink] [4:Pink] [5:Pink] Necrotic Amount: [3:Small (1-33%)] [4:Small (1-33%)] [5:Large (67-100%)] Necrotic Tissue: [3:Adherent Slough] [4:Adherent Slough] [5:Eschar, Adherent Slough] Exposed  Structures: [3:Fat Layer (Subcutaneous Tissue) Exposed: Yes Fascia: No Tendon: No Muscle: No Joint: No Bone: No] [4:Fat Layer (Subcutaneous Tissue) Exposed: Yes Fascia: No Tendon: No Muscle: No Joint: No Bone: No] [5:Fat Layer (Subcutaneous Tissue) Exposed: Yes  Fascia: No Tendon: No Muscle: No Joint: No Bone: No] Epithelialization: [3:Small (1-33%)] [4:Small (1-33%)] [5:None] Erythema Location: [3:N/A] [4:N/A] [5:Circumferential] Wound Preparation: [3:Ulcer Cleansing: Rinsed/Irrigated with Saline] [4:Ulcer Cleansing: Rinsed/Irrigated with Saline] [5:Ulcer Cleansing: Rinsed/Irrigated with Saline] Topical Anesthetic Applied: Topical Anesthetic Applied: Topical Anesthetic Applied: Other: lidocaine 4% Other: lidocaine 4% Other: lidocaine 4% Long Notes Electronic Signature(s) Signed: 09/21/2018 9:04:30 AM By: Tony Long Entered By: Tony Long on 09/20/2018 11:06:07 Tony Long ([id number]) -------------------------------------------------------------------------------- Multi-Disciplinary Care Plan Details Patient Name: Tony Long Date of Service: 09/20/2018 10:15 AM Medical Record Number: [id number] Patient Account Number: [id number] Date of Birth/Sex: 30-Jul-1975 (42 y.o. M) Treating RN: Tony Long Primary Care Jiyah Torpey: Tony Long Other Clinician: Referring Ebonee Stober: Referral, Self Treating Zalia Hautala/Extender: Tony Long, Tony Long: 0 Active Inactive Electronic Signature(s) Signed: 11/01/2018 1:58:10 PM By: Elliot Gurney, BSN, RN, CWS, Kim RN, BSN Signed: 02/25/2019 1:02:36 PM By: Tony Long Previous Signature: 09/21/2018 9:04:30 AM Version By: Tony Long Entered By: Elliot Gurney BSN, RN, CWS, Kim on 11/01/2018 13:58:09 Tony Long ([id number]) -------------------------------------------------------------------------------- Pain Assessment Details Patient Name: Tony Long Date of Service: 09/20/2018 10:15 AM Medical Record Number:  [id number] Patient Account Number: [id number] Date of Birth/Sex: [birth year] (43 y.o. M) Treating RN: Tony Long Primary Care Etoile Looman: Tony Long Other Clinician: Referring Allysson Rinehimer: Referral, Self Treating Liam Cammarata/Extender: Tony III, Tony Long: 0 Active Problems Location of Pain Severity and Description of Pain Patient Has Paino Yes Site Locations Pain Location: Pain in Ulcers With Dressing Change: Yes Duration of the Pain. Constant / Intermittento Intermittent Pain Management and Medication Current Pain Management: Electronic Signature(s) Signed: 09/20/2018 3:31:49 PM By: Tony Long Entered By: Tony Long on 09/20/2018 10:33:34 Tony Long ([id number]) -------------------------------------------------------------------------------- Patient/Caregiver Education Details Patient Name: Tony Long Date of Service: 09/20/2018 10:15 AM Medical Record Number: [id number] Patient Account Number: [id number] Date of Birth/Gender: 1976/06/19 (43 y.o. M) Treating RN: Tony Long Primary Care Physician: Tony Long Other Clinician: Referring Physician: Referral, Self Treating Physician/Extender: Tony Long, Tony Long: 0 Education Assessment Education Provided To: Patient Education Topics Provided Wound/Skin Impairment: Handouts: Caring for Your Ulcer Methods: Demonstration, Explain/Verbal Responses: State content correctly Electronic Signature(s) Signed: 09/21/2018 9:04:30 AM By: Tony Long Entered By: Tony Long on 09/20/2018 11:06:20 Tony Long ([id number]) -------------------------------------------------------------------------------- Wound Assessment Details Patient Name: Tony Long Date of Service: 09/20/2018 10:15 AM Medical Record Number: [id number] Patient Account Number: [id number] Date of Birth/Sex: 02-Jan-1976 (42 y.o. M) Treating RN: Tony Long Primary Care Sakari Raisanen: Tony Long Other Clinician: Referring Ebone Alcivar: Referral, Self Treating Hanin Decook/Extender: Tony III, Tony Long: 0 Wound Status Wound Number: 3 Primary Etiology: Open Surgical Wound Wound Location: Proximal Back Wound Status:  Open Wounding Event: Surgical Injury Comorbid History: Type II Diabetes Date Acquired: 03/21/2018 Weeks Of Long: 0 Clustered Wound: No Photos Photo Uploaded By: Tony Long on 09/20/2018 11:27:10 Wound Measurements Length: (cm) 10.5 Width: (cm) 0.2 Depth: (cm) 0.1 Area: (cm) 1.649 Volume: (cm) 0.165 % Reduction in Area: -1987.3% % Reduction in Volume: -1962.5% Epithelialization: Small (1-33%) Tunneling: No Undermining: No Wound Description Full Thickness Without Exposed Support Foul O Classification: Structures Slough Wound Margin: Flat and Intact Exudate None Present Amount: dor After Cleansing: No /Fibrino Yes Wound Bed Granulation Amount: Small (1-33%) Exposed Structure Granulation Quality: Pink Fascia Exposed: No Necrotic Amount: Small (1-33%) Fat Layer (Subcutaneous Tissue) Exposed: Yes Necrotic Quality: Adherent Slough Tendon Exposed: No Muscle Exposed: No Joint Exposed: No Bone Exposed: No Periwound Skin Texture Texture Color DELLIS, ALYEA ([id number]) No Abnormalities Noted: No No Abnormalities Noted: No Callus: No Atrophie Blanche: No Crepitus: No Cyanosis: No Excoriation: No Ecchymosis: No Induration: No Erythema: No Rash: No Hemosiderin Staining: No Scarring: Yes Mottled: No Pallor: No Moisture Rubor: No No Abnormalities Noted: No Dry / Scaly: No Temperature / Pain Maceration: No Temperature: No Abnormality Wound Preparation Ulcer Cleansing: Rinsed/Irrigated with Saline Topical Anesthetic Applied: Other: lidocaine 4%, Electronic Signature(s) Signed: 09/20/2018 3:31:49 PM By: Tony Long Entered By: Tony Long on 09/20/2018 10:53:14 Tony Long  ([id number]) -------------------------------------------------------------------------------- Wound Assessment Details Patient Name: Tony Long Date of Service: 09/20/2018 10:15 AM Medical Record Number: [id number] Patient Account Number: [id number] Date of Birth/Sex: 07-20-1975 (42 y.o. M) Treating RN: Tony Long Primary Care Javeion Cannedy: Tony Long Other Clinician: Referring Keni Wafer: Referral, Self Treating Catalyna Reilly/Extender: Tony III, Tony Long: 0 Wound Status Wound Number: 4 Primary Etiology: Open Surgical Wound Wound Location: Back - Distal Wound Status: Open Wounding Event: Surgical Injury Comorbid History: Type II Diabetes Date Acquired: 03/21/2018 Weeks Of Long: 0 Clustered Wound: No Photos Photo Uploaded By: Tony Long on 09/20/2018 11:27:11 Wound Measurements Length: (cm) 0.8 Width: (cm) 0.2 Depth: (cm) 0.1 Area: (cm) 0.126 Volume: (cm) 0.013 % Reduction in Area: % Reduction in Volume: Epithelialization: Small (1-33%) Tunneling: No Undermining: No Wound Description Full Thickness Without Exposed Support Foul O Classification: Structures Slough Wound Margin: Flat and Intact Exudate None Present Amount: dor After Cleansing: No /Fibrino Yes Wound Bed Granulation Amount: Small (1-33%) Exposed Structure Granulation Quality: Pink Fascia Exposed: No Necrotic Amount: Small (1-33%) Fat Layer (Subcutaneous Tissue) Exposed: Yes Necrotic Quality: Adherent Slough Tendon Exposed: No Muscle Exposed: No Joint Exposed: No Bone Exposed: No Periwound Skin Texture Texture Color NIC, LERNER ([id number]) No Abnormalities Noted: No No Abnormalities Noted: No Callus: No Atrophie Blanche: No Crepitus: No Cyanosis: No Excoriation: No Ecchymosis: No Induration: No Erythema: No Rash: No Hemosiderin Staining: No Scarring: Yes Mottled: No Pallor: No Moisture Rubor: No No Abnormalities Noted: No Dry / Scaly: No Temperature  / Pain Maceration: No Temperature: No Abnormality Wound Preparation Ulcer Cleansing: Rinsed/Irrigated with Saline Topical Anesthetic Applied: Other: lidocaine 4%, Electronic Signature(s) Signed: 09/20/2018 3:31:49 PM By: Tony Long Entered By: Tony Long on 09/20/2018 10:50:37 Tony Long ([id number]) -------------------------------------------------------------------------------- Wound Assessment Details Patient Name: Tony Long Date of Service: 09/20/2018 10:15 AM Medical Record Number: [id number] Patient Account Number: [id number] Date of Birth/Sex: 06-08-1976 (42 y.o. M) Treating RN: Tony Long Primary Care Breezie Micucci: Tony Long Other Clinician: Referring Maxi Rodas: Referral, Self Treating Neidra Girvan/Extender: Tony III, Tony Long: 0 Wound Status Wound Number: 5 Primary Etiology: Abscess Wound Location: Right Abdomen - Lower Quadrant Wound Status: Open Wounding Event: Gradually Appeared Comorbid History: Type II Diabetes  Date Acquired: 08/23/2018 Weeks Of Long: 0 Clustered Wound: No Photos Wound Measurements Length: (cm) 4.4 Width: (cm) 3.6 Depth: (cm) 0.1 Area: (cm) 12.441 Volume: (cm) 1.244 % Reduction in Area: 0% % Reduction in Volume: 0% Epithelialization: None Tunneling: No Undermining: No Wound Description Full Thickness Without Exposed Support Foul Odor Classification: Structures Slough/Fi Wound Margin: Flat and Intact Exudate Medium Amount: Exudate Type: Purulent Exudate Color: yellow, brown, green After Cleansing: No brino Yes Wound Bed Granulation Amount: Small (1-33%) Exposed Structure Granulation Quality: Pink Fascia Exposed: No Necrotic Amount: Large (67-100%) Fat Layer (Subcutaneous Tissue) Exposed: Yes Necrotic Quality: Eschar, Adherent Slough Tendon Exposed: No Muscle Exposed: No Joint Exposed: No Bone Exposed: No Periwound Skin Texture ALEKSA, CATTERTON ([id number]) Texture Color No  Abnormalities Noted: No No Abnormalities Noted: No Callus: No Atrophie Blanche: No Crepitus: No Cyanosis: No Excoriation: No Ecchymosis: No Induration: No Erythema: Yes Rash: No Erythema Location: Circumferential Scarring: No Hemosiderin Staining: No Mottled: No Moisture Pallor: No No Abnormalities Noted: No Rubor: No Dry / Scaly: No Maceration: No Temperature / Pain Temperature: No Abnormality Tenderness on Palpation: Yes Wound Preparation Ulcer Cleansing: Rinsed/Irrigated with Saline Topical Anesthetic Applied: Other: lidocaine 4%, Electronic Signature(s) Signed: 09/20/2018 3:31:49 PM By: Tony Long Signed: 09/22/2018 11:01:46 PM By: Lenda Kelp PA-C Entered By: Lenda Kelp on 09/20/2018 12:37:58 Tony Long ([id number]) -------------------------------------------------------------------------------- Vitals Details Patient Name: Tony Long Date of Service: 09/20/2018 10:15 AM Medical Record Number: [id number] Patient Account Number: [id number] Date of Birth/Sex: June 02, 1976 (43 y.o. M) Treating RN: Tony Long Primary Care Kharter Sestak: Tony Long Other Clinician: Referring Kyan Giannone: Referral, Self Treating Jaquann Guarisco/Extender: Tony III, Tony Long: 0 Vital Signs Time Taken: 10:39 Temperature (F): 98.0 Height (in): 71 Pulse (bpm): 81 Source: Measured Respiratory Rate (breaths/min): 18 Weight (lbs): 150 Blood Pressure (mmHg): 140/86 Source: Measured Reference Range: 80 - 120 mg / dl Body Mass Index (BMI): 20.9 Electronic Signature(s) Signed: 09/20/2018 3:31:49 PM By: Tony Long Entered By: Tony Long on 09/20/2018 10:40:14

## 2018-09-23 NOTE — Progress Notes (Signed)
Tony Long, Tony Long ([id number]) Visit Report for 09/20/2018 Chief Complaint Document Details Patient Name: Tony Long, Tony Long Date of Service: 09/20/2018 10:15 AM Medical Record Number: [id number] Patient Account Number: [id number] Date of Birth/Sex: 11/23/1975 (43 y.o. M) Treating RN: Arnette Norris Primary Care Provider: Raliegh Ip Other Clinician: Referring Provider: Referral, Self Treating Provider/Extender: Linwood Dibbles, HOYT Weeks in Treatment: 0 Information Obtained from: Patient Chief Complaint Thoracic spine abscess with lumbar spinal region ulcerations. New RLQ abd ulcer Electronic Signature(s) Signed: 09/22/2018 11:01:46 PM By: Lenda Kelp PA-C Entered By: Lenda Kelp on 09/20/2018 11:02:25 Tony Long ([id number]) -------------------------------------------------------------------------------- Debridement Details Patient Name: Tony Long Date of Service: 09/20/2018 10:15 AM Medical Record Number: [id number] Patient Account Number: [id number] Date of Birth/Sex: 1976/02/29 (42 y.o. M) Treating RN: Arnette Norris Primary Care Provider: Raliegh Ip Other Clinician: Referring Provider: Referral, Self Treating Provider/Extender: STONE III, HOYT Weeks in Treatment: 0 Debridement Performed for Wound #5 Right Abdomen - Lower Quadrant Assessment: Performed By: Physician STONE III, HOYT E., PA-C Debridement Type: Debridement Level of Consciousness (Pre- Awake and Alert procedure): Pre-procedure Verification/Time Yes - 11:08 Out Taken: Start Time: 11:08 Pain Control: Lidocaine Total Area Debrided (L x W): 4.4 (cm) x 3.6 (cm) = 15.84 (cm) Tissue and other material Non-Viable, Eschar, Slough, Subcutaneous, Slough debrided: Level: Skin/Subcutaneous Tissue Debridement Description: Excisional Instrument: Blade, Forceps, Scissors Bleeding: Minimum Hemostasis Achieved: Pressure End Time: 11:11 Procedural Pain: 2 Post Procedural Pain: 0 Response to  Treatment: Procedure was tolerated well Level of Consciousness Awake and Alert (Post-procedure): Post Debridement Measurements of Total Wound Length: (cm) 4.4 Width: (cm) 3.6 Depth: (cm) 0.1 Volume: (cm) 1.244 Character of Wound/Ulcer Post Debridement: Improved Post Procedure Diagnosis Same as Pre-procedure Electronic Signature(s) Signed: 09/21/2018 9:04:30 AM By: Arnette Norris Signed: 09/22/2018 11:01:46 PM By: Lenda Kelp PA-C Entered By: Arnette Norris on 09/20/2018 11:11:07 Tony Long ([id number]) -------------------------------------------------------------------------------- HPI Details Patient Name: Tony Long Date of Service: 09/20/2018 10:15 AM Medical Record Number: [id number] Patient Account Number: [id number] Date of Birth/Sex: 05-Jun-1976 (43 y.o. M) Treating RN: Arnette Norris Primary Care Provider: Raliegh Ip Other Clinician: Referring Provider: Referral, Self Treating Provider/Extender: STONE III, HOYT Weeks in Treatment: 0 History of Present Illness Associated Signs and Symptoms: Patient does have a history of chronic viral hepatitis see, hypertension, and opioid dependence. HPI Description: 05/31/18 on evaluation today patient presents for initial evaluation or office for two issues that he's been having with openings in the lumbar spine extending into the lower thoracic region. Subsequently it appears that as best I can tell from his notes he likely had an epidural abscess which subsequently led to these open areas. He was seen by infectious disease, Rexene Alberts, who referred him to [place] for management of the wounds. Apparently he has completed his course of IV antibiotic therapy at this point. Patient did have a technically limited examination of the cervical and thoracic spine by way of it and MRI. Unfortunately he was not able to tolerate the full length of the exam therefore only sagittal sections were obtained of the thoracic spine  region. Fortunately there is no imaging findings to suggest spinal cord infarction. There was interval improvement in the epidural collection (epidural abscess) on this examination when compared to the prior examination which was 03/22/18. The date of this MRI was 03/30/18. Again there was no imaging performed of the lumbar spine at this time. No new disc guidance or facet arthritis noted. There is no evidence of osteomyelitis. Currently the patient does not have any significant pain which  is good news. With that being said he is having some discomfort at this point mainly with packing of the wounds although I think packing is going to continue to be the treatment of choice. No fevers, chills, nausea, or vomiting noted at this time. 06/07/18 on evaluation today patient's wounds in the back region specifically the lumbar spine area appear to be doing very well at this time. In fact the 12 o'clock total of the cephalad wound shows evidence of not being quite as wide open as what I previously noted. In fact there is gonna be no chance of packing anything into this area at this time. The caudal wound actually shows signs of being about the same although again as I explained to the patient is gonna take some time for this area to heal. Readmission: 09/20/18 patient seen today for readmission concerning issues that he has been having with his back I previously saw him for this towards the end of last year 2019. Subsequently he was lost to follow-up due to not returning to the clinic. Subsequently what brings them in today is actually a different issue with his right lower quadrant abdominal area/groin where he has what appears to be an abscess at this time that for the most part is resolving but he actually has eschar covering the surface of the wound currently. With that being said he states this has been painful although it's less painful now than it's been in the past. He did attempt to use a razor blade  to actually remove some of this dark eschar prior to coming into the office today. With that being said he only did a very small portion and states he got too scared to continue. Again I believe it's best for him not to do this but rather allow [place] to do this if need be. Nonetheless I do think the escort coming off of benefit him he completely agrees. Fortunately there's no signs of active infection at this time. He does have some integration noted around the wound region. No fevers, chills, nausea, or vomiting noted at this time. Patient tells me that he currently is not utilizing any IV recreational drugs at this point he states he's doing very well in that regard. Electronic Signature(s) Signed: 09/22/2018 11:01:46 PM By: Lenda Kelp PA-C Entered By: Lenda Kelp on 09/20/2018 12:40:48 Tony Long ([id number]) -------------------------------------------------------------------------------- Physical Exam Details Patient Name: Tony Long Date of Service: 09/20/2018 10:15 AM Medical Record Number: [id number] Patient Account Number: [id number] Date of Birth/Sex: Jul 05, 1975 (42 y.o. M) Treating RN: Arnette Norris Primary Care Provider: Raliegh Ip Other Clinician: Referring Provider: Referral, Self Treating Provider/Extender: STONE III, HOYT Weeks in Treatment: 0 Constitutional patient is hypertensive.. pulse regular and within target range for patient.Marland Kitchen respirations regular, non-labored and within target range for patient.Marland Kitchen temperature within target range for patient.. Well-nourished and well-hydrated in no acute distress. Eyes conjunctiva clear no eyelid edema noted. pupils equal round and reactive to light and accommodation. Ears, Nose, Mouth, and Throat no gross abnormality of ear auricles or external auditory canals. normal hearing noted during conversation. mucus membranes moist. Respiratory normal breathing without difficulty. clear to auscultation  bilaterally. Cardiovascular regular rate and rhythm with normal S1, S2. no clubbing, cyanosis, significant edema, <3 sec cap refill. Gastrointestinal (GI) soft, non-tender, non-distended, +BS. no ventral hernia noted. Musculoskeletal normal gait and posture. no significant deformity or arthritic changes, no loss or range of motion, no clubbing. Psychiatric this patient is able to make decisions and demonstrates  good insight into disease process. Alert and Oriented x 3. pleasant and cooperative. Notes Upon inspection today it appears that potentially the patient may have an abscess that is in the process of resolving as result of potential even a folliculitis or ingrown here. Nonetheless he is having some discomfort but fortunately nothing too significant currently. He did agree to trying to remove the eschar today and I was able to sharply debride away the eschar without complication at this point. Post debridement wound bed appears to be doing better there's still [facility] noted that it was much less and looks much better than the eschar was present. Electronic Signature(s) Signed: 09/22/2018 11:01:46 PM By: Lenda Kelp PA-C Entered By: Lenda Kelp on 09/20/2018 12:40:26 Tony Long ([id number]) -------------------------------------------------------------------------------- Physician Orders Details Patient Name: Tony Long Date of Service: 09/20/2018 10:15 AM Medical Record Number: [id number] Patient Account Number: [id number] Date of Birth/Sex: 09/12/1975 (42 y.o. M) Treating RN: Arnette Norris Primary Care Provider: Raliegh Ip Other Clinician: Referring Provider: Referral, Self Treating Provider/Extender: Linwood Dibbles, HOYT Weeks in Treatment: 0 Verbal / Phone Orders: No Diagnosis Coding ICD-10 Coding Code Description E11.622 Type 2 diabetes mellitus with other skin ulcer L02.212 Cutaneous abscess of back [any part, except buttock] Unspecified open wound of  abdominal wall, right lower quadrant without penetration into peritoneal S31.103A cavity, initial encounter F11.20 Opioid dependence, uncomplicated I10 Essential (primary) hypertension B18.2 Chronic viral hepatitis C Wound Cleansing Wound #3 Proximal Back o Cleanse wound with mild soap and water Wound #4 Distal Back o Cleanse wound with mild soap and water Wound #5 Right Abdomen - Lower Quadrant o Cleanse wound with mild soap and water Primary Wound Dressing Wound #3 Proximal Back o Other: - Triple antibiotic ointment, bandaid Wound #4 Distal Back o Other: - Triple antibiotic ointment, bandaid Wound #5 Right Abdomen - Lower Quadrant o Santyl Ointment Secondary Dressing Wound #5 Right Abdomen - Lower Quadrant o Dry Gauze - Wet to dry gauze o Boardered Foam Dressing Dressing Change Frequency Wound #3 Proximal Back o Change dressing every other day. Wound #4 Distal Back o Change dressing every other day. Tony Long, Tony Long ([id number]) Wound #5 Right Abdomen - Lower Quadrant o Change dressing every day. Follow-up Appointments Wound #3 Proximal Back o Return Appointment in 1 week. Wound #4 Distal Back o Return Appointment in 1 week. Wound #5 Right Abdomen - Lower Quadrant o Return Appointment in 1 week. Laboratory o Bacteria identified in Wound by Culture (MICRO) - Culture of Right lower abdominal quadrant oooo LOINC Code: 6462-6 oooo Convenience Name: Wound culture routine Patient Medications Allergies: No Known Drug Allergies Notifications Medication Indication Start End Santyl 09/20/2018 DOSE topical 250 unit/gram ointment - ointment topical applied nickel thick to the wound bed and then cover with a dressing as directed Bactrim DS 09/20/2018 DOSE 1 - oral 800 mg-160 mg tablet - 1 tablet oral taken 2 times a day for 10 days Electronic Signature(s) Signed: 09/20/2018 12:43:55 PM By: Lenda Kelp PA-C Previous Signature: 09/20/2018 12:42:02  PM Version By: Lenda Kelp PA-C Entered By: Lenda Kelp on 09/20/2018 12:43:55 Tony Long ([id number]) -------------------------------------------------------------------------------- Problem List Details Patient Name: Tony Long Date of Service: 09/20/2018 10:15 AM Medical Record Number: [id number] Patient Account Number: [id number] Date of Birth/Sex: June 11, 1976 (42 y.o. M) Treating RN: Arnette Norris Primary Care Provider: Raliegh Ip Other Clinician: Referring Provider: Referral, Self Treating Provider/Extender: Linwood Dibbles, HOYT Weeks in Treatment: 0 Active Problems ICD-10 Evaluated Encounter Code Description Active Date Today Diagnosis E11.622 Type 2  diabetes mellitus with other skin ulcer 09/20/2018 No Yes L02.212 Cutaneous abscess of back [any part, except buttock] 09/20/2018 No Yes S31.103A Unspecified open wound of abdominal wall, right lower 09/20/2018 No Yes quadrant without penetration into peritoneal cavity, initial encounter F11.20 Opioid dependence, uncomplicated 09/20/2018 No Yes I10 Essential (primary) hypertension 09/20/2018 No Yes B18.2 Chronic viral hepatitis C 09/20/2018 No Yes Inactive Problems Resolved Problems Electronic Signature(s) Signed: 09/22/2018 11:01:46 PM By: Lenda Kelp PA-C Entered By: Lenda Kelp on 09/20/2018 11:01:54 Tony Long ([id number]) -------------------------------------------------------------------------------- Progress Note Details Patient Name: Tony Long Date of Service: 09/20/2018 10:15 AM Medical Record Number: [id number] Patient Account Number: [id number] Date of Birth/Sex: January 30, 1976 (42 y.o. M) Treating RN: Arnette Norris Primary Care Provider: Raliegh Ip Other Clinician: Referring Provider: Referral, Self Treating Provider/Extender: Linwood Dibbles, HOYT Weeks in Treatment: 0 Subjective Chief Complaint Information obtained from Patient Thoracic spine abscess with lumbar spinal region  ulcerations. New RLQ abd ulcer History of Present Illness (HPI) The following HPI elements were documented for the patient's wound: Associated Signs and Symptoms: Patient does have a history of chronic viral hepatitis see, hypertension, and opioid dependence. 05/31/18 on evaluation today patient presents for initial evaluation or office for two issues that he's been having with openings in the lumbar spine extending into the lower thoracic region. Subsequently it appears that as best I can tell from his notes he likely had an epidural abscess which subsequently led to these open areas. He was seen by infectious disease, Rexene Alberts, who referred him to [place] for management of the wounds. Apparently he has completed his course of IV antibiotic therapy at this point. Patient did have a technically limited examination of the cervical and thoracic spine by way of it and MRI. Unfortunately he was not able to tolerate the full length of the exam therefore only sagittal sections were obtained of the thoracic spine region. Fortunately there is no imaging findings to suggest spinal cord infarction. There was interval improvement in the epidural collection (epidural abscess) on this examination when compared to the prior examination which was 03/22/18. The date of this MRI was 03/30/18. Again there was no imaging performed of the lumbar spine at this time. No new disc guidance or facet arthritis noted. There is no evidence of osteomyelitis. Currently the patient does not have any significant pain which is good news. With that being said he is having some discomfort at this point mainly with packing of the wounds although I think packing is going to continue to be the treatment of choice. No fevers, chills, nausea, or vomiting noted at this time. 06/07/18 on evaluation today patient's wounds in the back region specifically the lumbar spine area appear to be doing very well at this time. In fact the 12 o'clock  total of the cephalad wound shows evidence of not being quite as wide open as what I previously noted. In fact there is gonna be no chance of packing anything into this area at this time. The caudal wound actually shows signs of being about the same although again as I explained to the patient is gonna take some time for this area to heal. Readmission: 09/20/18 patient seen today for readmission concerning issues that he has been having with his back I previously saw him for this towards the end of last year 2019. Subsequently he was lost to follow-up due to not returning to the clinic. Subsequently what brings them in today is actually a different issue with his right lower quadrant  abdominal area/groin where he has what appears to be an abscess at this time that for the most part is resolving but he actually has eschar covering the surface of the wound currently. With that being said he states this has been painful although it's less painful now than it's been in the past. He did attempt to use a razor blade to actually remove some of this dark eschar prior to coming into the office today. With that being said he only did a very small portion and states he got too scared to continue. Again I believe it's best for him not to do this but rather allow [place] to do this if need be. Nonetheless I do think the escort coming off of benefit him he completely agrees. Fortunately there's no signs of active infection at this time. He does have some integration noted around the wound region. No fevers, chills, nausea, or vomiting noted at this time. Patient tells me that he currently is not utilizing any IV recreational drugs at this point he states he's doing very well in that regard. Wound History Patient presents with 3 open wounds that have been present for approximately 6 months. Patient has been treating wounds in [place], Apolinar Junes ([id number]) the following manner: neosporin and bandaid. Laboratory tests  have not been performed in the last month. Patient reportedly has not tested positive for an antibiotic resistant organism. Patient reportedly has not tested positive for osteomyelitis. Patient reportedly has not had testing performed to evaluate circulation in the legs. Patient History Information obtained from Patient. Allergies No Known Drug Allergies Family History Cancer - Maternal Grandparents, Diabetes - Father, Heart Disease - Father, No family history of Hereditary Spherocytosis, Hypertension, Kidney Disease, Lung Disease, Seizures, Stroke, Thyroid Problems, Tuberculosis. Social History Current every day smoker, Marital Status - Single, Alcohol Use - Moderate, Drug Use - Prior History, Caffeine Use - Daily. Medical History Eyes Denies history of Cataracts, Glaucoma Ear/Nose/Mouth/Throat Denies history of Chronic sinus problems/congestion, Middle ear problems Hematologic/Lymphatic Denies history of Anemia, Hemophilia, Human Immunodeficiency Virus, Lymphedema, Sickle Cell Disease Respiratory Denies history of Aspiration, Asthma, Chronic Obstructive Pulmonary Disease (COPD), Pneumothorax, Sleep Apnea, Tuberculosis Cardiovascular Denies history of Angina, Arrhythmia, Congestive Heart Failure, Coronary Artery Disease, Deep Vein Thrombosis, Hypertension, Hypotension, Myocardial Infarction, Peripheral Arterial Disease, Peripheral Venous Disease, Phlebitis, Vasculitis Gastrointestinal Denies history of Cirrhosis , Colitis, Crohn s, Hepatitis A, Hepatitis B, Hepatitis C Endocrine Patient has history of Type II Diabetes - 7 years Denies history of Type I Diabetes Genitourinary Denies history of End Stage Renal Disease Immunological Denies history of Lupus Erythematosus, Raynaud s, Scleroderma Integumentary (Skin) Denies history of History of Burn, History of pressure wounds Musculoskeletal Denies history of Gout, Rheumatoid Arthritis, Osteoarthritis,  Osteomyelitis Neurologic Denies history of Dementia, Neuropathy, Quadriplegia, Paraplegia, Seizure Disorder Oncologic Denies history of Received Chemotherapy, Received Radiation Review of Systems (ROS) Eyes Denies complaints or symptoms of Dry Eyes, Vision Changes, Glasses / Contacts. Ear/Nose/Mouth/Throat Denies complaints or symptoms of Difficult clearing ears, Sinusitis. Hematologic/Lymphatic Denies complaints or symptoms of Bleeding / Clotting Disorders, Human Immunodeficiency Virus. Respiratory Denies complaints or symptoms of Chronic or frequent coughs, Shortness of Breath. Tony Long, Tony Long ([id number]) Cardiovascular Denies complaints or symptoms of Chest pain, LE edema. Gastrointestinal Denies complaints or symptoms of Frequent diarrhea, Nausea, Vomiting. Endocrine Denies complaints or symptoms of Hepatitis, Thyroid disease, Polydypsia (Excessive Thirst). Genitourinary Denies complaints or symptoms of Kidney failure/ Dialysis, Incontinence/dribbling. Immunological Denies complaints or symptoms of Hives, Itching. Integumentary (Skin) Denies complaints or symptoms of Wounds, Bleeding  or bruising tendency, Breakdown, Swelling. Musculoskeletal Denies complaints or symptoms of Muscle Pain, Muscle Weakness. Neurologic Denies complaints or symptoms of Numbness/parasthesias, Focal/Weakness. Objective Constitutional patient is hypertensive.. pulse regular and within target range for patient.Marland Kitchen respirations regular, non-labored and within target range for patient.Marland Kitchen temperature within target range for patient.. Well-nourished and well-hydrated in no acute distress. Vitals Time Taken: 10:39 AM, Height: 71 in, Source: Measured, Weight: 150 lbs, Source: Measured, BMI: 20.9, Temperature: 98.0 F, Pulse: 81 bpm, Respiratory Rate: 18 breaths/min, Blood Pressure: 140/86 mmHg. Eyes conjunctiva clear no eyelid edema noted. pupils equal round and reactive to light and accommodation. Ears,  Nose, Mouth, and Throat no gross abnormality of ear auricles or external auditory canals. normal hearing noted during conversation. mucus membranes moist. Respiratory normal breathing without difficulty. clear to auscultation bilaterally. Cardiovascular regular rate and rhythm with normal S1, S2. no clubbing, cyanosis, significant edema, Gastrointestinal (GI) soft, non-tender, non-distended, +BS. no ventral hernia noted. Musculoskeletal normal gait and posture. no significant deformity or arthritic changes, no loss or range of motion, no clubbing. Psychiatric this patient is able to make decisions and demonstrates good insight into disease process. Alert and Oriented x 3. pleasant and cooperative. General Notes: Upon inspection today it appears that potentially the patient may have an abscess that is in the process of resolving as result of potential even a folliculitis or ingrown here. Nonetheless he is having some discomfort but fortunately Tony Long, Tony Long ([id number]) nothing too significant currently. He did agree to trying to remove the eschar today and I was able to sharply debride away the eschar without complication at this point. Post debridement wound bed appears to be doing better there's still [facility] noted that it was much less and looks much better than the eschar was present. Integumentary (Hair, Skin) Wound #3 status is Open. Original cause of wound was Surgical Injury. The wound is located on the Proximal Back. The wound measures 10.5cm length x 0.2cm width x 0.1cm depth; 1.649cm^2 area and 0.165cm^3 volume. There is Fat Layer (Subcutaneous Tissue) Exposed exposed. There is no tunneling or undermining noted. There is a none present amount of drainage noted. The wound margin is flat and intact. There is small (1-33%) pink granulation within the wound bed. There is a small (1-33%) amount of necrotic tissue within the wound bed including Adherent Slough. The periwound skin  appearance exhibited: Scarring. The periwound skin appearance did not exhibit: Callus, Crepitus, Excoriation, Induration, Rash, Dry/Scaly, Maceration, Atrophie Blanche, Cyanosis, Ecchymosis, Hemosiderin Staining, Mottled, Pallor, Rubor, Erythema. Periwound temperature was noted as No Abnormality. Wound #4 status is Open. Original cause of wound was Surgical Injury. The wound is located on the Distal Back. The wound measures 0.8cm length x 0.2cm width x 0.1cm depth; 0.126cm^2 area and 0.013cm^3 volume. There is Fat Layer (Subcutaneous Tissue) Exposed exposed. There is no tunneling or undermining noted. There is a none present amount of drainage noted. The wound margin is flat and intact. There is small (1-33%) pink granulation within the wound bed. There is a small (1-33%) amount of necrotic tissue within the wound bed including Adherent Slough. The periwound skin appearance exhibited: Scarring. The periwound skin appearance did not exhibit: Callus, Crepitus, Excoriation, Induration, Rash, Dry/Scaly, Maceration, Atrophie Blanche, Cyanosis, Ecchymosis, Hemosiderin Staining, Mottled, Pallor, Rubor, Erythema. Periwound temperature was noted as No Abnormality. Wound #5 status is Open. Original cause of wound was Gradually Appeared. The wound is located on the Right Abdomen - Lower Quadrant. The wound measures 4.4cm length x 3.6cm width x 0.1cm  depth; 12.441cm^2 area and 1.244cm^3 volume. There is Fat Layer (Subcutaneous Tissue) Exposed exposed. There is no tunneling or undermining noted. There is a medium amount of purulent drainage noted. The wound margin is flat and intact. There is small (1-33%) pink granulation within the wound bed. There is a large (67-100%) amount of necrotic tissue within the wound bed including Eschar and Adherent Slough. The periwound skin appearance exhibited: Erythema. The periwound skin appearance did not exhibit: Callus, Crepitus, Excoriation, Induration, Rash, Scarring,  Dry/Scaly, Maceration, Atrophie Blanche, Cyanosis, Ecchymosis, Hemosiderin Staining, Mottled, Pallor, Rubor. The surrounding wound skin color is noted with erythema which is circumferential. Periwound temperature was noted as No Abnormality. The periwound has tenderness on palpation. Assessment Active Problems ICD-10 Type 2 diabetes mellitus with other skin ulcer Cutaneous abscess of back [any part, except buttock] Unspecified open wound of abdominal wall, right lower quadrant without penetration into peritoneal cavity, initial encounter Opioid dependence, uncomplicated Essential (primary) hypertension Chronic viral hepatitis C Procedures Wound #5 Pre-procedure diagnosis of Wound #5 is a To be determined located on the Right Abdomen - Lower Quadrant . There was a Excisional Skin/Subcutaneous Tissue Debridement with a total area of 15.84 sq cm performed by STONE III, HOYT E., PA-CWynelle Long ([id number]) With the following instrument(s): Blade, Forceps, and Scissors to remove Non-Viable tissue/material. Material removed includes Eschar, Subcutaneous Tissue, and Slough after achieving pain control using Lidocaine. No specimens were taken. A time out was conducted at 11:08, prior to the start of the procedure. A Minimum amount of bleeding was controlled with Pressure. The procedure was tolerated well with a pain level of 2 throughout and a pain level of 0 following the procedure. Post Debridement Measurements: 4.4cm length x 3.6cm width x 0.1cm depth; 1.244cm^3 volume. Character of Wound/Ulcer Post Debridement is improved. Post procedure Diagnosis Wound #5: Same as Pre-Procedure Plan Wound Cleansing: Wound #3 Proximal Back: Cleanse wound with mild soap and water Wound #4 Distal Back: Cleanse wound with mild soap and water Wound #5 Right Abdomen - Lower Quadrant: Cleanse wound with mild soap and water Primary Wound Dressing: Wound #3 Proximal Back: Other: - Triple antibiotic  ointment, bandaid Wound #4 Distal Back: Other: - Triple antibiotic ointment, bandaid Wound #5 Right Abdomen - Lower Quadrant: Santyl Ointment Secondary Dressing: Wound #5 Right Abdomen - Lower Quadrant: Dry Gauze - Wet to dry gauze Boardered Foam Dressing Dressing Change Frequency: Wound #3 Proximal Back: Change dressing every other day. Wound #4 Distal Back: Change dressing every other day. Wound #5 Right Abdomen - Lower Quadrant: Change dressing every day. Follow-up Appointments: Wound #3 Proximal Back: Return Appointment in 1 week. Wound #4 Distal Back: Return Appointment in 1 week. Wound #5 Right Abdomen - Lower Quadrant: Return Appointment in 1 week. Laboratory ordered were: Wound culture routine - Culture of Right lower abdominal quadrant The following medication(s) was prescribed: Santyl topical 250 unit/gram ointment ointment topical applied nickel thick to the wound bed and then cover with a dressing as directed starting 09/20/2018 Bactrim DS oral 800 mg-160 mg tablet 1 1 tablet oral taken 2 times a day for 10 days starting 09/20/2018 Upon inspection evaluation today my suggestion in the end is that we go ahead and initiate the above wound care measures for the next week. I did send in a prescription for him for cental which I think will definitely be of benefit as far as helping to [place], Apolinar Junes ([id number]) clean up the area and allow this to heal most appropriately. The patient is in  agreement with that plan. I did send in the prescription for the Santyl to the pharmacy for the patient. Subsequently we're gonna see were things stand at follow-up in one weeks time. If anything changes or worsens meantime he will contact the office and let me know. Please see above for specific wound care orders. We will see patient for re-evaluation in 1 week(s) here in the clinic. If anything worsens or changes patient will contact our office for additional recommendations. I did go  ahead and send in a prescription for an antibiotic as well just as a prophylactic measure based on what I'm seeing we did perform a wound culture as well will see what that shows making adjustments as necessary. Electronic Signature(s) Signed: 09/22/2018 11:01:46 PM By: Lenda Kelp PA-C Entered By: Lenda Kelp on 09/20/2018 12:44:04 Tony Long ([id number]) -------------------------------------------------------------------------------- ROS/PFSH Details Patient Name: Tony Long Date of Service: 09/20/2018 10:15 AM Medical Record Number: [id number] Patient Account Number: [id number] Date of Birth/Sex: 1976-06-09 (42 y.o. M) Treating RN: Curtis Sites Primary Care Provider: Raliegh Ip Other Clinician: Referring Provider: Referral, Self Treating Provider/Extender: STONE III, HOYT Weeks in Treatment: 0 Information Obtained From Patient Wound History Do you currently have one or more open woundso Yes How many open wounds do you currently haveo 3 Approximately how long have you had your woundso 6 months How have you been treating your wound(s) until nowo neosporin and bandaid Has your wound(s) ever healed and then re-openedo No Have you had any lab work done in the past montho No Have you tested positive for an antibiotic resistant organism (MRSA, VRE)o No Have you tested positive for osteomyelitis (bone infection)o No Have you had any tests for circulation on your legso No Eyes Complaints and Symptoms: Negative for: Dry Eyes; Vision Changes; Glasses / Contacts Medical History: Negative for: Cataracts; Glaucoma Ear/Nose/Mouth/Throat Complaints and Symptoms: Negative for: Difficult clearing ears; Sinusitis Medical History: Negative for: Chronic sinus problems/congestion; Middle ear problems Hematologic/Lymphatic Complaints and Symptoms: Negative for: Bleeding / Clotting Disorders; Human Immunodeficiency Virus Medical History: Negative for: Anemia; Hemophilia;  Human Immunodeficiency Virus; Lymphedema; Sickle Cell Disease Respiratory Complaints and Symptoms: Negative for: Chronic or frequent coughs; Shortness of Breath Medical History: Negative for: Aspiration; Asthma; Chronic Obstructive Pulmonary Disease (COPD); Pneumothorax; Sleep Apnea; Tuberculosis Cardiovascular Tony Long, Tony Long ([id number]) Complaints and Symptoms: Negative for: Chest pain; LE edema Medical History: Negative for: Angina; Arrhythmia; Congestive Heart Failure; Coronary Artery Disease; Deep Vein Thrombosis; Hypertension; Hypotension; Myocardial Infarction; Peripheral Arterial Disease; Peripheral Venous Disease; Phlebitis; Vasculitis Gastrointestinal Complaints and Symptoms: Negative for: Frequent diarrhea; Nausea; Vomiting Medical History: Negative for: Cirrhosis ; Colitis; Crohnos; Hepatitis A; Hepatitis B; Hepatitis C Endocrine Complaints and Symptoms: Negative for: Hepatitis; Thyroid disease; Polydypsia (Excessive Thirst) Medical History: Positive for: Type II Diabetes - 7 years Negative for: Type I Diabetes Time with diabetes: 7 years Treated with: Insulin Genitourinary Complaints and Symptoms: Negative for: Kidney failure/ Dialysis; Incontinence/dribbling Medical History: Negative for: End Stage Renal Disease Immunological Complaints and Symptoms: Negative for: Hives; Itching Medical History: Negative for: Lupus Erythematosus; Raynaudos; Scleroderma Integumentary (Skin) Complaints and Symptoms: Negative for: Wounds; Bleeding or bruising tendency; Breakdown; Swelling Medical History: Negative for: History of Burn; History of pressure wounds Musculoskeletal Complaints and Symptoms: Negative for: Muscle Pain; Muscle Weakness Medical History: Negative for: Gout; Rheumatoid Arthritis; Osteoarthritis; Osteomyelitis Tony Long ([id number]) Neurologic Complaints and Symptoms: Negative for: Numbness/parasthesias; Focal/Weakness Medical  History: Negative for: Dementia; Neuropathy; Quadriplegia; Paraplegia; Seizure Disorder Oncologic Medical History: Negative for: Received Chemotherapy; Received Radiation Immunizations  Pneumococcal Vaccine: Received Pneumococcal Vaccination: No Implantable Devices None Family and Social History Cancer: Yes - Maternal Grandparents; Diabetes: Yes - Father; Heart Disease: Yes - Father; Hereditary Spherocytosis: No; Hypertension: No; Kidney Disease: No; Lung Disease: No; Seizures: No; Stroke: No; Thyroid Problems: No; Tuberculosis: No; Current every day smoker; Marital Status - Single; Alcohol Use: Moderate; Drug Use: Prior History; Caffeine Use: Daily; Financial Concerns: No; Food, Clothing or Shelter Needs: No; Support System Lacking: No; Transportation Concerns: No; Advanced Directives: No; Patient does not want information on Advanced Directives; Do not resuscitate: No; Living Will: No; Medical Power of Attorney: No Electronic Signature(s) Signed: 09/20/2018 3:31:49 PM By: Curtis Sites Signed: 09/22/2018 11:01:46 PM By: Lenda Kelp PA-C Entered By: Curtis Sites on 09/20/2018 10:35:57 Tony Long ([id number]) -------------------------------------------------------------------------------- SuperBill Details Patient Name: Tony Long Date of Service: 09/20/2018 Medical Record Number: [id number] Patient Account Number: [id number] Date of Birth/Sex: 1975-10-01 (43 y.o. M) Treating RN: Arnette Norris Primary Care Provider: Raliegh Ip Other Clinician: Referring Provider: Referral, Self Treating Provider/Extender: Linwood Dibbles, HOYT Weeks in Treatment: 0 Diagnosis Coding ICD-10 Codes Code Description E11.622 Type 2 diabetes mellitus with other skin ulcer L02.212 Cutaneous abscess of back [any part, except buttock] Unspecified open wound of abdominal wall, right lower quadrant without penetration into peritoneal S31.103A cavity, initial encounter F11.20 Opioid  dependence, uncomplicated I10 Essential (primary) hypertension B18.2 Chronic viral hepatitis C Facility Procedures CPT4: Description Modifier Quantity Code 78295621 11042 - DEB SUBQ TISSUE 20 SQ CM/< 1 ICD-10 Diagnosis Description S31.103A Unspecified open wound of abdominal wall, right lower quadrant without penetration into peritoneal cavity, initial encounter Physician Procedures CPT4: Description Modifier Quantity Code [id number] 99214 - WC PHYS LEVEL 4 - EST PT 25 1 ICD-10 Diagnosis Description E11.622 Type 2 diabetes mellitus with other skin ulcer S31.103A Unspecified open wound of abdominal wall, right lower quadrant without  penetration into peritoneal cavity, initial encounter L02.212 Cutaneous abscess of back [any part, except buttock] F11.20 Opioid dependence, uncomplicated CPT4: 4696295 11042 - WC PHYS SUBQ TISS 20 SQ CM 1 ICD-10 Diagnosis Description S31.103A Unspecified open wound of abdominal wall, right lower quadrant without penetration into peritoneal cavity, initial encounter Electronic Signature(s) Signed: 09/22/2018 11:01:46 PM By: Lenda Kelp PA-C Entered By: Lenda Kelp on 09/20/2018 12:44:27

## 2018-09-27 ENCOUNTER — Ambulatory Visit: Payer: Self-pay | Admitting: Physician Assistant

## 2018-10-21 ENCOUNTER — Telehealth: Payer: Self-pay

## 2018-10-22 ENCOUNTER — Other Ambulatory Visit: Payer: Self-pay | Admitting: Family Medicine

## 2018-10-22 DIAGNOSIS — G629 Polyneuropathy, unspecified: Secondary | ICD-10-CM

## 2018-10-22 MED ORDER — GABAPENTIN 300 MG PO CAPS: ORAL_CAPSULE | ORAL | 3 refills | Status: DC

## 2018-10-22 NOTE — Telephone Encounter (Signed)
Patient notified and will pick up medication  

## 2018-10-22 NOTE — Telephone Encounter (Signed)
Patient states that the the Gabapentin is suppose to be 600mg  2 times a day and it was sent for the original script instead.

## 2018-11-08 ENCOUNTER — Ambulatory Visit: Payer: Self-pay | Admitting: Family Medicine

## 2019-01-05 ENCOUNTER — Ambulatory Visit: Payer: Self-pay | Admitting: Family Medicine

## 2019-01-06 ENCOUNTER — Encounter: Payer: Medicaid Other | Attending: Physician Assistant | Admitting: Physician Assistant

## 2019-01-06 ENCOUNTER — Other Ambulatory Visit: Payer: Self-pay

## 2019-01-06 DIAGNOSIS — I10 Essential (primary) hypertension: Secondary | ICD-10-CM | POA: Diagnosis not present

## 2019-01-06 DIAGNOSIS — E11622 Type 2 diabetes mellitus with other skin ulcer: Secondary | ICD-10-CM | POA: Insufficient documentation

## 2019-01-06 DIAGNOSIS — L97818 Non-pressure chronic ulcer of other part of right lower leg with other specified severity: Secondary | ICD-10-CM | POA: Insufficient documentation

## 2019-01-06 DIAGNOSIS — L97822 Non-pressure chronic ulcer of other part of left lower leg with fat layer exposed: Secondary | ICD-10-CM | POA: Insufficient documentation

## 2019-01-06 DIAGNOSIS — F17200 Nicotine dependence, unspecified, uncomplicated: Secondary | ICD-10-CM | POA: Insufficient documentation

## 2019-01-06 DIAGNOSIS — Z8249 Family history of ischemic heart disease and other diseases of the circulatory system: Secondary | ICD-10-CM | POA: Insufficient documentation

## 2019-01-06 DIAGNOSIS — L97811 Non-pressure chronic ulcer of other part of right lower leg limited to breakdown of skin: Secondary | ICD-10-CM | POA: Diagnosis not present

## 2019-01-06 DIAGNOSIS — F1120 Opioid dependence, uncomplicated: Secondary | ICD-10-CM | POA: Diagnosis not present

## 2019-01-06 DIAGNOSIS — L97521 Non-pressure chronic ulcer of other part of left foot limited to breakdown of skin: Secondary | ICD-10-CM | POA: Diagnosis not present

## 2019-01-06 DIAGNOSIS — E11621 Type 2 diabetes mellitus with foot ulcer: Secondary | ICD-10-CM | POA: Insufficient documentation

## 2019-01-06 DIAGNOSIS — B182 Chronic viral hepatitis C: Secondary | ICD-10-CM | POA: Insufficient documentation

## 2019-01-06 DIAGNOSIS — L97121 Non-pressure chronic ulcer of left thigh limited to breakdown of skin: Secondary | ICD-10-CM | POA: Diagnosis not present

## 2019-01-06 NOTE — Progress Notes (Signed)
Tony Long, Tony Long ([id number]) Visit Report for 01/06/2019 Abuse/Suicide Risk Screen Details Patient Name: Tony Long, Tony Long Date of Service: 01/06/2019 2:15 PM Medical Record Number: [id number] Patient Account Number: [id number] Date of Birth/Sex: 01/13/1976 (43 y.o. M) Treating RN: Curtis Sitesorthy, Joanna Primary Care Janaisha Tolsma: Raliegh IpSTROUD, NATALIE Other Clinician: Referring Aliza Moret: Referral, Self Treating Rendy Lazard/Extender: STONE III, HOYT Weeks in Treatment: 0 Abuse/Suicide Risk Screen Items Answer ABUSE RISK SCREEN: Has anyone close to you tried to hurt or Tony Long you recentlyo No Do you feel uncomfortable with anyone in your familyo No Has anyone forced you do things that you didnot want to doo No Electronic Signature(s) Signed: 01/06/2019 4:02:59 PM By: Curtis Sitesorthy, Joanna Entered By: Curtis Sitesorthy, Joanna on 01/06/2019 14:18:08 Tony Long, Tony Long ([id number]) -------------------------------------------------------------------------------- Activities of Daily Living Details Patient Name: Tony Long, Tony Long Date of Service: 01/06/2019 2:15 PM Medical Record Number: [id number] Patient Account Number: [id number] Date of Birth/Sex: 06/19/1976 (43 y.o. M) Treating RN: Curtis Sitesorthy, Joanna Primary Care Nguyen Butler: Raliegh IpSTROUD, NATALIE Other Clinician: Referring Wilfrido Luedke: Referral, Self Treating Jakevion Arney/Extender: STONE III, HOYT Weeks in Treatment: 0 Activities of Daily Living Items Answer Activities of Daily Living (Please select one for each item) Drive Automobile Completely Able Take Medications Completely Able Use Telephone Completely Able Care for Appearance Completely Able Use Toilet Completely Able Bath / Shower Completely Able Dress Self Completely Able Feed Self Completely Able Walk Completely Able Get In / Out Bed Completely Able Housework Completely Able Prepare Meals Completely Able Handle Money Completely Able Shop for Self Completely Able Electronic Signature(s) Signed: 01/06/2019 4:02:59 PM By: Curtis Sitesorthy,  Joanna Entered By: Curtis Sitesorthy, Joanna on 01/06/2019 14:19:20 Tony Long, Tony Long ([id number]) -------------------------------------------------------------------------------- Education Screening Details Patient Name: Tony Long, Tony Long Date of Service: 01/06/2019 2:15 PM Medical Record Number: [id number] Patient Account Number: [id number] Date of Birth/Sex: 11/15/1975 (43 y.o. M) Treating RN: Curtis Sitesorthy, Joanna Primary Care Yolette Hastings: Raliegh IpSTROUD, NATALIE Other Clinician: Referring Smt. Loder: Referral, Self Treating Koralyn Prestage/Extender: Linwood DibblesSTONE III, HOYT Weeks in Treatment: 0 Primary Learner Assessed: Patient Learning Preferences/Education Level/Primary Language Learning Preference: Explanation, Demonstration Highest Education Level: College or Above Preferred Language: English Cognitive Barrier Language Barrier: No Translator Needed: No Memory Deficit: No Emotional Barrier: No Cultural/Religious Beliefs Affecting Medical Care: No Physical Barrier Impaired Vision: No Impaired Hearing: No Decreased Hand dexterity: No Knowledge/Comprehension Knowledge Level: Medium Comprehension Level: Medium Ability to understand written Medium instructions: Ability to understand verbal Medium instructions: Motivation Anxiety Level: Calm Cooperation: Cooperative Education Importance: Acknowledges Need Interest in Health Problems: Asks Questions Perception: Coherent Willingness to Engage in Self- Medium Management Activities: Readiness to Engage in Self- Medium Management Activities: Electronic Signature(s) Signed: 01/06/2019 4:02:59 PM By: Curtis Sitesorthy, Joanna Entered By: Curtis Sitesorthy, Joanna on 01/06/2019 14:19:55 Tony Long, Tony Long ([id number]) -------------------------------------------------------------------------------- Fall Risk Assessment Details Patient Name: Tony Long, Tony Long Date of Service: 01/06/2019 2:15 PM Medical Record Number: [id number] Patient Account Number: [id number] Date of Birth/Sex: 01/11/1976 (43  y.o. M) Treating RN: Curtis Sitesorthy, Joanna Primary Care Calisha Tindel: Raliegh IpSTROUD, NATALIE Other Clinician: Referring Brenda Samano: Referral, Self Treating Jabarie Pop/Extender: Linwood DibblesSTONE III, HOYT Weeks in Treatment: 0 Fall Risk Assessment Items Have you had 2 or more falls in the last 12 monthso 0 No Have you had any fall that resulted in injury in the last 12 monthso 0 No FALLS RISK SCREEN History of falling - immediate or within 3 months 0 No Secondary diagnosis (Do you have 2 or more medical diagnoseso) 0 No Ambulatory aid None/bed rest/wheelchair/nurse 0 Yes Crutches/cane/walker 0 No Furniture 0 No Intravenous therapy Access/Saline/Heparin Lock 0 No Gait/Transferring Normal/ bed rest/ wheelchair 0 Yes Weak (short steps with or without shuffle, stooped  but able to lift head while 0 No walking, may seek support from furniture) Impaired (short steps with shuffle, may have difficulty arising from chair, head 0 No down, impaired balance) Mental Status Oriented to own ability 0 Yes Electronic Signature(s) Signed: 01/06/2019 4:02:59 PM By: Montey Hora Entered By: Montey Hora on 01/06/2019 14:20:05 Tony Long ([id number]) -------------------------------------------------------------------------------- Foot Assessment Details Patient Name: Tony Long Date of Service: 01/06/2019 2:15 PM Medical Record Number: [id number] Patient Account Number: [id number] Date of Birth/Sex: 08/09/1975 (43 y.o. M) Treating RN: Montey Hora Primary Care Saleha Kalp: Kathe Becton Other Clinician: Referring Curties Conigliaro: Referral, Self Treating Govanni Plemons/Extender: STONE III, HOYT Weeks in Treatment: 0 Foot Assessment Items Site Locations + = Sensation present, - = Sensation absent, C = Callus, U = Ulcer R = Redness, W = Warmth, M = Maceration, PU = Pre-ulcerative lesion F = Fissure, S = Swelling, D = Dryness Assessment Right: Left: Other Deformity: No No Prior Foot Ulcer: No No Prior Amputation: No  No Charcot Joint: No No Ambulatory Status: Ambulatory Without Help Gait: Steady Electronic Signature(s) Signed: 01/06/2019 4:02:59 PM By: Montey Hora Entered By: Montey Hora on 01/06/2019 14:45:28 Tony Long ([id number]) -------------------------------------------------------------------------------- Nutrition Risk Screening Details Patient Name: Tony Long Date of Service: 01/06/2019 2:15 PM Medical Record Number: [id number] Patient Account Number: [id number] Date of Birth/Sex: 17-Jun-1976 (43 y.o. M) Treating RN: Montey Hora Primary Care Houda Brau: Kathe Becton Other Clinician: Referring Taniah Reinecke: Referral, Self Treating Olesya Wike/Extender: STONE III, HOYT Weeks in Treatment: 0 Height (in): 71 Weight (lbs): 155 Body Mass Index (BMI): 21.6 Nutrition Risk Screening Items Score Screening NUTRITION RISK SCREEN: I have an illness or condition that made me change the kind and/or amount of 0 No food I eat I eat fewer than two meals per day 0 No I eat few fruits and vegetables, or milk products 0 No I have three or more drinks of beer, liquor or wine almost every day 0 No I have tooth or mouth problems that make it hard for me to eat 0 No I don't always have enough money to buy the food I need 0 No I eat alone most of the time 0 No I take three or more different prescribed or over-the-counter drugs a day 0 No Without wanting to, I have lost or gained 10 pounds in the last six months 0 No I am not always physically able to shop, cook and/or feed myself 0 No Nutrition Protocols Good Risk Protocol 0 No interventions needed Moderate Risk Protocol High Risk Proctocol Risk Level: Good Risk Score: 0 Electronic Signature(s) Signed: 01/06/2019 4:02:59 PM By: Montey Hora Entered By: Montey Hora on 01/06/2019 14:20:21

## 2019-01-09 NOTE — Progress Notes (Signed)
Tony Long, Tony Long ([id number]) Visit Report for 01/06/2019 Chief Complaint Document Details Patient Name: Tony Long, Tony Long Date of Service: 01/06/2019 2:15 PM Medical Record Number: [id number] Patient Account Number: [id number] Date of Birth/Sex: 11/09/1975 (43 y.o. M) Treating RN: Arnette NorrisBiell, Kristina Primary Care Provider: Raliegh IpSTROUD, NATALIE Other Clinician: Referring Provider: Referral, Self Treating Provider/Extender: Linwood DibblesSTONE III, Adan Beal Weeks in Treatment: 0 Information Obtained from: Patient Chief Complaint Bilateral LE Ulcers Electronic Signature(s) Signed: 01/09/2019 4:13:38 PM By: Lenda KelpStone III, Kree Armato PA-C Entered By: Lenda KelpStone III, Minoru Chap on 01/06/2019 14:58:39 Tony Long, Heyden ([id number]) -------------------------------------------------------------------------------- Debridement Details Patient Name: Tony Long, Tony Long Date of Service: 01/06/2019 2:15 PM Medical Record Number: [id number] Patient Account Number: [id number] Date of Birth/Sex: 12/25/1975 (42 y.o. M) Treating RN: Rodell PernaScott, Dajea Primary Care Provider: Raliegh IpSTROUD, NATALIE Other Clinician: Referring Provider: Referral, Self Treating Provider/Extender: STONE III, Nyzier Boivin Weeks in Treatment: 0 Debridement Performed for Wound #10 Left,Lateral Upper Leg Assessment: Performed By: Physician STONE III, Michelena Culmer E., PA-C Debridement Type: Chemical/Enzymatic/Mechanical Agent Used: Santyl Severity of Tissue Pre Limited to breakdown of skin Debridement: Level of Consciousness (Pre- Awake and Alert procedure): Pre-procedure Verification/Time Yes - 15:08 Out Taken: Start Time: 15:09 Instrument: Other : santyl Bleeding: None End Time: 15:10 Response to Treatment: Procedure was tolerated well Level of Consciousness Awake and Alert (Post-procedure): Post Debridement Measurements of Total Wound Length: (cm) 1.3 Width: (cm) 1 Depth: (cm) 0.2 Volume: (cm) 0.204 Character of Wound/Ulcer Post Debridement: Stable Severity of Tissue Post Debridement:  Limited to breakdown of skin Post Procedure Diagnosis Same as Pre-procedure Electronic Signature(s) Signed: 01/06/2019 3:37:18 PM By: Rodell PernaScott, Dajea Signed: 01/09/2019 4:13:38 PM By: Lenda KelpStone III, Sukhmani Fetherolf PA-C Entered By: Rodell PernaScott, Dajea on 01/06/2019 15:10:28 Tony Long, Landrum ([id number]) -------------------------------------------------------------------------------- Debridement Details Patient Name: Tony Long, Tony Long Date of Service: 01/06/2019 2:15 PM Medical Record Number: [id number] Patient Account Number: [id number] Date of Birth/Sex: 12/20/1975 (43 y.o. M) Treating RN: Rodell PernaScott, Dajea Primary Care Provider: Raliegh IpSTROUD, NATALIE Other Clinician: Referring Provider: Referral, Self Treating Provider/Extender: STONE III, Jerrold Haskell Weeks in Treatment: 0 Debridement Performed for Wound #11 Right,Lateral Knee Assessment: Performed By: Physician STONE III, Selam Pietsch E., PA-C Debridement Type: Chemical/Enzymatic/Mechanical Agent Used: Santyl Severity of Tissue Pre Limited to breakdown of skin Debridement: Level of Consciousness (Pre- Awake and Alert procedure): Pre-procedure Verification/Time Yes - 15:08 Out Taken: Start Time: 15:09 Instrument: Other : santyl Bleeding: None End Time: 15:10 Response to Treatment: Procedure was tolerated well Level of Consciousness Awake and Alert (Post-procedure): Post Debridement Measurements of Total Wound Length: (cm) 0.8 Width: (cm) 0.7 Depth: (cm) 0.1 Volume: (cm) 0.044 Character of Wound/Ulcer Post Debridement: Stable Severity of Tissue Post Debridement: Limited to breakdown of skin Post Procedure Diagnosis Same as Pre-procedure Electronic Signature(s) Signed: 01/06/2019 3:37:18 PM By: Rodell PernaScott, Dajea Signed: 01/09/2019 4:13:38 PM By: Lenda KelpStone III, Kaid Seeberger PA-C Entered By: Rodell PernaScott, Dajea on 01/06/2019 15:10:59 Tony Long, Tony Long ([id number]) -------------------------------------------------------------------------------- Debridement Details Patient Name: Tony Long,  Tony Long Date of Service: 01/06/2019 2:15 PM Medical Record Number: [id number] Patient Account Number: [id number] Date of Birth/Sex: 04/17/1976 (43 y.o. M) Treating RN: Rodell PernaScott, Dajea Primary Care Provider: Raliegh IpSTROUD, NATALIE Other Clinician: Referring Provider: Referral, Self Treating Provider/Extender: STONE III, Abigail Marsiglia Weeks in Treatment: 0 Debridement Performed for Wound #6 Left,Lateral,Posterior Lower Leg Assessment: Performed By: Physician STONE III, Felicie Kocher E., PA-C Debridement Type: Chemical/Enzymatic/Mechanical Agent Used: Santyl Severity of Tissue Pre Limited to breakdown of skin Debridement: Level of Consciousness (Pre- Awake and Alert procedure): Pre-procedure Verification/Time Yes - 15:08 Out Taken: Start Time: 15:09 Instrument: Other : santyl Bleeding: None End Time: 15:10 Response to Treatment: Procedure was tolerated well Level  of Consciousness Awake and Alert (Post-procedure): Post Debridement Measurements of Total Wound Length: (cm) 2.7 Width: (cm) 1.6 Depth: (cm) 0.1 Volume: (cm) 0.339 Character of Wound/Ulcer Post Debridement: Stable Severity of Tissue Post Debridement: Limited to breakdown of skin Post Procedure Diagnosis Same as Pre-procedure Electronic Signature(s) Signed: 01/06/2019 3:37:18 PM By: Rodell Perna Signed: 01/09/2019 4:13:38 PM By: Lenda Kelp PA-C Entered By: Rodell Perna on 01/06/2019 15:11:29 Tony Long ([id number]) -------------------------------------------------------------------------------- Debridement Details Patient Name: Tony Long Date of Service: 01/06/2019 2:15 PM Medical Record Number: [id number] Patient Account Number: [id number] Date of Birth/Sex: [birth year] (43 y.o. M) Treating RN: Rodell Perna Primary Care Provider: Raliegh Ip Other Clinician: Referring Provider: Referral, Self Treating Provider/Extender: STONE III, Savier Trickett Weeks in Treatment: 0 Debridement Performed for Wound #7 Left,Distal,Lateral Lower  Leg Assessment: Performed By: Physician STONE III, Saxon Barich E., PA-C Debridement Type: Chemical/Enzymatic/Mechanical Agent Used: Santyl Severity of Tissue Pre Fat layer exposed Debridement: Level of Consciousness (Pre- Awake and Alert procedure): Pre-procedure Verification/Time Yes - 15:08 Out Taken: Start Time: 15:09 Instrument: Other : santyl Bleeding: None End Time: 15:10 Response to Treatment: Procedure was tolerated well Level of Consciousness Awake and Alert (Post-procedure): Post Debridement Measurements of Total Wound Length: (cm) 0.9 Width: (cm) 0.6 Depth: (cm) 0.3 Volume: (cm) 0.127 Character of Wound/Ulcer Post Debridement: Stable Severity of Tissue Post Debridement: Fat layer exposed Post Procedure Diagnosis Same as Pre-procedure Electronic Signature(s) Signed: 01/06/2019 3:37:18 PM By: Rodell Perna Signed: 01/09/2019 4:13:38 PM By: Lenda Kelp PA-C Entered By: Rodell Perna on 01/06/2019 15:12:01 Tony Long ([id number]) -------------------------------------------------------------------------------- Debridement Details Patient Name: Tony Long Date of Service: 01/06/2019 2:15 PM Medical Record Number: [id number] Patient Account Number: [id number] Date of Birth/Sex: 16-Dec-1975 (43 y.o. M) Treating RN: Rodell Perna Primary Care Provider: Raliegh Ip Other Clinician: Referring Provider: Referral, Self Treating Provider/Extender: STONE III, Annette Bertelson Weeks in Treatment: 0 Debridement Performed for Wound #8 Left,Dorsal Foot Assessment: Performed By: Physician STONE III, Jules Vidovich E., PA-C Debridement Type: Chemical/Enzymatic/Mechanical Agent Used: Santyl Severity of Tissue Pre Limited to breakdown of skin Debridement: Level of Consciousness (Pre- Awake and Alert procedure): Pre-procedure Verification/Time Yes - 15:08 Out Taken: Start Time: 15:09 Instrument: Other : santyl Bleeding: None End Time: 15:10 Response to Treatment: Procedure was  tolerated well Level of Consciousness Awake and Alert (Post-procedure): Post Debridement Measurements of Total Wound Length: (cm) 0.4 Width: (cm) 0.4 Depth: (cm) 0.1 Volume: (cm) 0.013 Character of Wound/Ulcer Post Debridement: Stable Severity of Tissue Post Debridement: Limited to breakdown of skin Post Procedure Diagnosis Same as Pre-procedure Electronic Signature(s) Signed: 01/06/2019 3:37:18 PM By: Rodell Perna Signed: 01/09/2019 4:13:38 PM By: Lenda Kelp PA-C Entered By: Rodell Perna on 01/06/2019 15:13:05 Tony Long ([id number]) -------------------------------------------------------------------------------- Debridement Details Patient Name: Tony Long Date of Service: 01/06/2019 2:15 PM Medical Record Number: [id number] Patient Account Number: [id number] Date of Birth/Sex: 06-17-1976 (43 y.o. M) Treating RN: Rodell Perna Primary Care Provider: Raliegh Ip Other Clinician: Referring Provider: Referral, Self Treating Provider/Extender: STONE III, Ramell Wacha Weeks in Treatment: 0 Debridement Performed for Wound #9 Left,Medial Lower Leg Assessment: Performed By: Physician STONE III, Alexias Margerum E., PA-C Debridement Type: Chemical/Enzymatic/Mechanical Agent Used: Santyl Severity of Tissue Pre Limited to breakdown of skin Debridement: Level of Consciousness (Pre- Awake and Alert procedure): Pre-procedure Verification/Time Yes - 15:08 Out Taken: Start Time: 15:09 Instrument: Other : santyl Bleeding: None End Time: 15:10 Response to Treatment: Procedure was tolerated well Level of Consciousness Awake and Alert (Post-procedure): Post Debridement Measurements of Total Wound Length: (cm) 2.3 Width: (cm) 1.5 Depth: (cm) 0.1  Volume: (cm) 0.271 Character of Wound/Ulcer Post Debridement: Stable Severity of Tissue Post Debridement: Limited to breakdown of skin Post Procedure Diagnosis Same as Pre-procedure Electronic Signature(s) Signed: 01/06/2019 3:37:18 PM  By: Rodell Perna Signed: 01/09/2019 4:13:38 PM By: Lenda Kelp PA-C Entered By: Rodell Perna on 01/06/2019 15:13:38 Tony Long ([id number]) -------------------------------------------------------------------------------- HPI Details Patient Name: Tony Long Date of Service: 01/06/2019 2:15 PM Medical Record Number: [id number] Patient Account Number: [id number] Date of Birth/Sex: May 12, 1976 (43 y.o. M) Treating RN: Arnette Norris Primary Care Provider: Raliegh Ip Other Clinician: Referring Provider: Referral, Self Treating Provider/Extender: STONE III, Najir Roop Weeks in Treatment: 0 History of Present Illness Associated Signs and Symptoms: Patient does have a history of chronic viral hepatitis see, hypertension, and opioid dependence. HPI Description: 05/31/18 on evaluation today patient presents for initial evaluation or office for two issues that he's been having with openings in the lumbar spine extending into the lower thoracic region. Subsequently it appears that as best I can tell from his notes he likely had an epidural abscess which subsequently led to these open areas. He was seen by infectious disease, Rexene Alberts, who referred him to [place] for management of the wounds. Apparently he has completed his course of IV antibiotic therapy at this point. Patient did have a technically limited examination of the cervical and thoracic spine by way of it and MRI. Unfortunately he was not able to tolerate the full length of the exam therefore only sagittal sections were obtained of the thoracic spine region. Fortunately there is no imaging findings to suggest spinal cord infarction. There was interval improvement in the epidural collection (epidural abscess) on this examination when compared to the prior examination which was 03/22/18. The date of this MRI was 03/30/18. Again there was no imaging performed of the lumbar spine at this time. No new disc guidance or facet arthritis  noted. There is no evidence of osteomyelitis. Currently the patient does not have any significant pain which is good news. With that being said he is having some discomfort at this point mainly with packing of the wounds although I think packing is going to continue to be the treatment of choice. No fevers, chills, nausea, or vomiting noted at this time. 06/07/18 on evaluation today patient's wounds in the back region specifically the lumbar spine area appear to be doing very well at this time. In fact the 12 o'clock total of the cephalad wound shows evidence of not being quite as wide open as what I previously noted. In fact there is gonna be no chance of packing anything into this area at this time. The caudal wound actually shows signs of being about the same although again as I explained to the patient is gonna take some time for this area to heal. Readmission: 09/20/18 patient seen today for readmission concerning issues that he has been having with his back I previously saw him for this towards the end of last year 2019. Subsequently he was lost to follow-up due to not returning to the clinic. Subsequently what brings them in today is actually a different issue with his right lower quadrant abdominal area/groin where he has what appears to be an abscess at this time that for the most part is resolving but he actually has eschar covering the surface of the wound currently. With that being said he states this has been painful although it's less painful now than it's been in the past. He did attempt to use a razor blade to actually remove  some of this dark eschar prior to coming into the office today. With that being said he only did a very small portion and states he got too scared to continue. Again I believe it's best for him not to do this but rather allow [place] to do this if need be. Nonetheless I do think the escort coming off of benefit him he completely agrees. Fortunately there's no signs of  active infection at this time. He does have some integration noted around the wound region. No fevers, chills, nausea, or vomiting noted at this time. Patient tells me that he currently is not utilizing any IV recreational drugs at this point he states he's doing very well in that regard. Readmission: 01/06/19 patient presents today for reevaluation in our clinic. I previously seen him four wounds on his back region in particular. Nonetheless he actually has wounds of this time in regard to his bilateral lower extremities which unfortunately have been giving him some issues for several weeks. Fortunately there's no signs of significant infection although there does seem to be some irritation. He states this will start out as red patches that become somewhat tender and then open up into ulcers. He has not gotten any answers as to what may be causing this. He does have a history of opioid abuse but tells me he has not used the recreational drugs in the past nine months. He does have hypertension in multiple lower extremity leg ulcers and varying degrees of severity and tissue exposure noted of the bilateral lower extremities. He also has diabetes. There is no history of trauma he does not know where these came from. HARVE, SPRADLEY ([id number]) Electronic Signature(s) Signed: 01/09/2019 4:13:38 PM By: Lenda Kelp PA-C Entered By: Lenda Kelp on 01/09/2019 01:36:06 Tony Long ([id number]) -------------------------------------------------------------------------------- Physical Exam Details Patient Name: Tony Long Date of Service: 01/06/2019 2:15 PM Medical Record Number: [id number] Patient Account Number: [id number] Date of Birth/Sex: [birth year] (42 y.o. M) Treating RN: Arnette Norris Primary Care Provider: Raliegh Ip Other Clinician: Referring Provider: Referral, Self Treating Provider/Extender: STONE III, Loden Laurent Weeks in Treatment: 0 Constitutional sitting or standing  blood pressure is within target range for patient.. pulse regular and within target range for patient.Marland Kitchen respirations regular, non-labored and within target range for patient.Marland Kitchen temperature within target range for patient.. Well- nourished and well-hydrated in no acute distress. Eyes conjunctiva clear no eyelid edema noted. pupils equal round and reactive to light and accommodation. Ears, Nose, Mouth, and Throat no gross abnormality of ear auricles or external auditory canals. normal hearing noted during conversation. mucus membranes moist. Respiratory normal breathing without difficulty. clear to auscultation bilaterally. Cardiovascular regular rate and rhythm with normal S1, S2. no clubbing, cyanosis, significant edema, <3 sec cap refill. Gastrointestinal (GI) soft, non-tender, non-distended, +BS. no ventral hernia noted. Musculoskeletal normal gait and posture. no significant deformity or arthritic changes, no loss or range of motion, no clubbing. Psychiatric this patient is able to make decisions and demonstrates good insight into disease process. Alert and Oriented x 3. pleasant and cooperative. Notes Upon inspection patient has multiple mainly eschar covered lesions noted of the bilateral lower extremities. A couple of these are shown more of the granulation tissue and subcutaneous tissue underneath the eschar. He does have an area of your theme over the right medial ankle region which is not a wound at this point but he tells me this region is what often starts as far as the appearance of the wounds are concerned and then it  develops into the ulceration. I did discuss with the patient that we might would like to get a biopsy in order to determine what we are dealing with. Nonetheless he does not want to proceed down that route at this point if he changes his mind in that regard we may consider biopsy in the near future. Electronic Signature(s) Signed: 01/09/2019 4:13:38 PM By: Lenda Kelp PA-C Entered By: Lenda Kelp on 01/09/2019 01:37:38 Tony Long ([id number]) -------------------------------------------------------------------------------- Physician Orders Details Patient Name: Tony Long Date of Service: 01/06/2019 2:15 PM Medical Record Number: [id number] Patient Account Number: [id number] Date of Birth/Sex: 1976/02/01 (42 y.o. M) Treating RN: Rodell Perna Primary Care Provider: Raliegh Ip Other Clinician: Referring Provider: Referral, Self Treating Provider/Extender: Linwood Dibbles, Diamantina Edinger Weeks in Treatment: 0 Verbal / Phone Orders: No Diagnosis Coding ICD-10 Coding Code Description E11.622 Type 2 diabetes mellitus with other skin ulcer L97.822 Non-pressure chronic ulcer of other part of left lower leg with fat layer exposed L97.828 Non-pressure chronic ulcer of other part of left lower leg with other specified severity L97.818 Non-pressure chronic ulcer of other part of right lower leg with other specified severity F11.20 Opioid dependence, uncomplicated I10 Essential (primary) hypertension B18.2 Chronic viral hepatitis C Wound Cleansing Wound #10 Left,Lateral Upper Leg o Other: - hibi-cleanse Wound #11 Right,Lateral Knee o Other: - hibi-cleanse Wound #6 Left,Lateral,Posterior Lower Leg o Other: - hibi-cleanse Wound #7 Left,Distal,Lateral Lower Leg o Other: - hibi-cleanse Wound #8 Left,Dorsal Foot o Other: - hibi-cleanse Wound #9 Left,Medial Lower Leg o Other: - hibi-cleanse Primary Wound Dressing Wound #10 Left,Lateral Upper Leg o Santyl Ointment Wound #11 Right,Lateral Knee o Santyl Ointment Wound #6 Left,Lateral,Posterior Lower Leg o Santyl Ointment Wound #7 Left,Distal,Lateral Lower Leg o Santyl Ointment Wound #8 Left,Dorsal Foot JACUB, WAITERS ([id number]) o Santyl Ointment Wound #9 Left,Medial Lower Leg o Santyl Ointment Secondary Dressing Wound #10 Left,Lateral Upper Leg o Other -  coverlet Wound #11 Right,Lateral Knee o Other - coverlet Wound #6 Left,Lateral,Posterior Lower Leg o Other - coverlet Wound #7 Left,Distal,Lateral Lower Leg o Other - coverlet Wound #8 Left,Dorsal Foot o Other - coverlet Wound #9 Left,Medial Lower Leg o Other - coverlet Dressing Change Frequency Wound #10 Left,Lateral Upper Leg o Change dressing every day. Wound #11 Right,Lateral Knee o Change dressing every day. Wound #6 Left,Lateral,Posterior Lower Leg o Change dressing every day. Wound #7 Left,Distal,Lateral Lower Leg o Change dressing every day. Wound #8 Left,Dorsal Foot o Change dressing every day. Wound #9 Left,Medial Lower Leg o Change dressing every day. Follow-up Appointments Wound #10 Left,Lateral Upper Leg o Return Appointment in 1 week. Wound #11 Right,Lateral Knee o Return Appointment in 1 week. Wound #6 Left,Lateral,Posterior Lower Leg o Return Appointment in 1 week. Wound #7 Left,Distal,Lateral Lower Leg o Return Appointment in 1 week. CONN, TROMBETTA ([id number]) Wound #8 Left,Dorsal Foot o Return Appointment in 1 week. Wound #9 Left,Medial Lower Leg o Return Appointment in 1 week. Patient Medications Allergies: No Known Drug Allergies Notifications Medication Indication Start End Santyl 01/09/2019 DOSE topical 250 unit/gram ointment - ointment topical applied nickel thick to the wound bed and then cover with a dressing as directed Electronic Signature(s) Signed: 01/09/2019 1:39:09 AM By: Lenda Kelp PA-C Previous Signature: 01/06/2019 3:37:18 PM Version By: Rodell Perna Entered By: Lenda Kelp on 01/09/2019 01:39:09 Tony Long ([id number]) -------------------------------------------------------------------------------- Problem List Details Patient Name: Tony Long Date of Service: 01/06/2019 2:15 PM Medical Record Number: [id number] Patient Account Number: [id number] Date of Birth/Sex: Jan 10, 1976 (42  y.o. M)  Treating RN: Arnette Norris Primary Care Provider: Raliegh Ip Other Clinician: Referring Provider: Referral, Self Treating Provider/Extender: Linwood Dibbles, Romeka Scifres Weeks in Treatment: 0 Active Problems ICD-10 Evaluated Encounter Code Description Active Date Today Diagnosis E11.622 Type 2 diabetes mellitus with other skin ulcer 01/06/2019 No Yes L97.822 Non-pressure chronic ulcer of other part of left lower leg with 01/06/2019 No Yes fat layer exposed L97.828 Non-pressure chronic ulcer of other part of left lower leg with 01/06/2019 No Yes other specified severity L97.818 Non-pressure chronic ulcer of other part of right lower leg 01/06/2019 No Yes with other specified severity F11.20 Opioid dependence, uncomplicated 01/06/2019 No Yes I10 Essential (primary) hypertension 01/06/2019 No Yes B18.2 Chronic viral hepatitis C 01/06/2019 No Yes Inactive Problems Resolved Problems Electronic Signature(s) Signed: 01/09/2019 4:13:38 PM By: Lenda Kelp PA-C Entered By: Lenda Kelp on 01/06/2019 14:58:22 Tony Long ([id number]) -------------------------------------------------------------------------------- Progress Note Details Patient Name: Tony Long Date of Service: 01/06/2019 2:15 PM Medical Record Number: [id number] Patient Account Number: [id number] Date of Birth/Sex: [birth year] (42 y.o. M) Treating RN: Arnette Norris Primary Care Provider: Raliegh Ip Other Clinician: Referring Provider: Referral, Self Treating Provider/Extender: Linwood Dibbles, Virna Livengood Weeks in Treatment: 0 Subjective Chief Complaint Information obtained from Patient Bilateral LE Ulcers History of Present Illness (HPI) The following HPI elements were documented for the patient's wound: Associated Signs and Symptoms: Patient does have a history of chronic viral hepatitis see, hypertension, and opioid dependence. 05/31/18 on evaluation today patient presents for initial evaluation or office for two  issues that he's been having with openings in the lumbar spine extending into the lower thoracic region. Subsequently it appears that as best I can tell from his notes he likely had an epidural abscess which subsequently led to these open areas. He was seen by infectious disease, Rexene Alberts, who referred him to [place] for management of the wounds. Apparently he has completed his course of IV antibiotic therapy at this point. Patient did have a technically limited examination of the cervical and thoracic spine by way of it and MRI. Unfortunately he was not able to tolerate the full length of the exam therefore only sagittal sections were obtained of the thoracic spine region. Fortunately there is no imaging findings to suggest spinal cord infarction. There was interval improvement in the epidural collection (epidural abscess) on this examination when compared to the prior examination which was 03/22/18. The date of this MRI was 03/30/18. Again there was no imaging performed of the lumbar spine at this time. No new disc guidance or facet arthritis noted. There is no evidence of osteomyelitis. Currently the patient does not have any significant pain which is good news. With that being said he is having some discomfort at this point mainly with packing of the wounds although I think packing is going to continue to be the treatment of choice. No fevers, chills, nausea, or vomiting noted at this time. 06/07/18 on evaluation today patient's wounds in the back region specifically the lumbar spine area appear to be doing very well at this time. In fact the 12 o'clock total of the cephalad wound shows evidence of not being quite as wide open as what I previously noted. In fact there is gonna be no chance of packing anything into this area at this time. The caudal wound actually shows signs of being about the same although again as I explained to the patient is gonna take some time for this area to  heal. Readmission: 09/20/18 patient seen today for readmission concerning  issues that he has been having with his back I previously saw him for this towards the end of last year 2019. Subsequently he was lost to follow-up due to not returning to the clinic. Subsequently what brings them in today is actually a different issue with his right lower quadrant abdominal area/groin where he has what appears to be an abscess at this time that for the most part is resolving but he actually has eschar covering the surface of the wound currently. With that being said he states this has been painful although it's less painful now than it's been in the past. He did attempt to use a razor blade to actually remove some of this dark eschar prior to coming into the office today. With that being said he only did a very small portion and states he got too scared to continue. Again I believe it's best for him not to do this but rather allow [place] to do this if need be. Nonetheless I do think the escort coming off of benefit him he completely agrees. Fortunately there's no signs of active infection at this time. He does have some integration noted around the wound region. No fevers, chills, nausea, or vomiting noted at this time. Patient tells me that he currently is not utilizing any IV recreational drugs at this point he states he's doing very well in that regard. Readmission: 01/06/19 patient presents today for reevaluation in our clinic. I previously seen him four wounds on his back region in particular. VEARL, ALLBAUGH ([id number]) Nonetheless he actually has wounds of this time in regard to his bilateral lower extremities which unfortunately have been giving him some issues for several weeks. Fortunately there's no signs of significant infection although there does seem to be some irritation. He states this will start out as red patches that become somewhat tender and then open up into ulcers. He has not gotten any  answers as to what may be causing this. He does have a history of opioid abuse but tells me he has not used the recreational drugs in the past nine months. He does have hypertension in multiple lower extremity leg ulcers and varying degrees of severity and tissue exposure noted of the bilateral lower extremities. He also has diabetes. There is no history of trauma he does not know where these came from. Patient History Information obtained from Patient. Allergies No Known Drug Allergies Family History Cancer - Maternal Grandparents, Diabetes - Father, Heart Disease - Father, No family history of Hereditary Spherocytosis, Hypertension, Kidney Disease, Lung Disease, Seizures, Stroke, Thyroid Problems, Tuberculosis. Social History Current every day smoker, Marital Status - Single, Alcohol Use - Moderate, Drug Use - Prior History, Caffeine Use - Daily. Medical History Eyes Denies history of Cataracts, Glaucoma, Optic Neuritis Ear/Nose/Mouth/Throat Denies history of Chronic sinus problems/congestion, Middle ear problems Hematologic/Lymphatic Denies history of Anemia, Hemophilia, Human Immunodeficiency Virus, Lymphedema, Sickle Cell Disease Respiratory Denies history of Aspiration, Asthma, Chronic Obstructive Pulmonary Disease (COPD), Pneumothorax, Sleep Apnea, Tuberculosis Cardiovascular Denies history of Angina, Arrhythmia, Congestive Heart Failure, Coronary Artery Disease, Deep Vein Thrombosis, Hypertension, Hypotension, Myocardial Infarction, Peripheral Arterial Disease, Peripheral Venous Disease, Phlebitis, Vasculitis Gastrointestinal Denies history of Cirrhosis , Colitis, Crohn s, Hepatitis A, Hepatitis B, Hepatitis C Endocrine Patient has history of Type II Diabetes - 7 years Denies history of Type I Diabetes Genitourinary Denies history of End Stage Renal Disease Immunological Denies history of Lupus Erythematosus, Raynaud s, Scleroderma Integumentary (Skin) Denies history of  History of Burn, History of pressure  wounds Musculoskeletal Denies history of Gout, Rheumatoid Arthritis, Osteoarthritis, Osteomyelitis Neurologic Denies history of Dementia, Neuropathy, Quadriplegia, Paraplegia, Seizure Disorder Oncologic Denies history of Received Chemotherapy, Received Radiation Review of Systems (ROS) Constitutional Symptoms (General Health) Denies complaints or symptoms of Fatigue, Fever, Chills, Marked Weight Change. Eyes Tony Long, Tony Long ([id number]) Denies complaints or symptoms of Dry Eyes, Vision Changes, Glasses / Contacts. Ear/Nose/Mouth/Throat Denies complaints or symptoms of Difficult clearing ears, Sinusitis. Hematologic/Lymphatic Denies complaints or symptoms of Bleeding / Clotting Disorders, Human Immunodeficiency Virus. Respiratory Denies complaints or symptoms of Chronic or frequent coughs, Shortness of Breath. Cardiovascular Denies complaints or symptoms of Chest pain, LE edema. Gastrointestinal Denies complaints or symptoms of Frequent diarrhea, Nausea, Vomiting. Endocrine Denies complaints or symptoms of Hepatitis, Thyroid disease, Polydypsia (Excessive Thirst). Genitourinary Denies complaints or symptoms of Kidney failure/ Dialysis, Incontinence/dribbling. Immunological Denies complaints or symptoms of Hives, Itching. Integumentary (Skin) Denies complaints or symptoms of Wounds, Bleeding or bruising tendency, Breakdown, Swelling. Musculoskeletal Denies complaints or symptoms of Muscle Pain, Muscle Weakness. Neurologic Denies complaints or symptoms of Numbness/parasthesias, Focal/Weakness. Psychiatric Denies complaints or symptoms of Anxiety, Claustrophobia. Objective Constitutional sitting or standing blood pressure is within target range for patient.. pulse regular and within target range for patient.Marland Kitchen. respirations regular, non-labored and within target range for patient.Marland Kitchen. temperature within target range for patient.. Well- nourished  and well-hydrated in no acute distress. Vitals Time Taken: 2:15 PM, Height: 71 in, Source: Measured, Weight: 155 lbs, Source: Measured, BMI: 21.6, Temperature: 98.2 F, Pulse: 89 bpm, Respiratory Rate: 16 breaths/min, Blood Pressure: 123/76 mmHg. Eyes conjunctiva clear no eyelid edema noted. pupils equal round and reactive to light and accommodation. Ears, Nose, Mouth, and Throat no gross abnormality of ear auricles or external auditory canals. normal hearing noted during conversation. mucus membranes moist. Respiratory normal breathing without difficulty. clear to auscultation bilaterally. Cardiovascular regular rate and rhythm with normal S1, S2. no clubbing, cyanosis, significant edema, Gastrointestinal (GI) soft, non-tender, non-distended, +BS. no ventral hernia noted. Tony Long, Tony Long ([id number]) Musculoskeletal normal gait and posture. no significant deformity or arthritic changes, no loss or range of motion, no clubbing. Psychiatric this patient is able to make decisions and demonstrates good insight into disease process. Alert and Oriented x 3. pleasant and cooperative. General Notes: Upon inspection patient has multiple mainly eschar covered lesions noted of the bilateral lower extremities. A couple of these are shown more of the granulation tissue and subcutaneous tissue underneath the eschar. He does have an area of your theme over the right medial ankle region which is not a wound at this point but he tells me this region is what often starts as far as the appearance of the wounds are concerned and then it develops into the ulceration. I did discuss with the patient that we might would like to get a biopsy in order to determine what we are dealing with. Nonetheless he does not want to proceed down that route at this point if he changes his mind in that regard we may consider biopsy in the near future. Integumentary (Hair, Skin) Wound #10 status is Open. Original cause of wound  was Gradually Appeared. The wound is located on the Left,Lateral Upper Leg. The wound measures 1.3cm length x 1cm width x 0.2cm depth; 1.021cm^2 area and 0.204cm^3 volume. The wound is limited to skin breakdown. There is no tunneling or undermining noted. There is a none present amount of drainage noted. The wound margin is flat and intact. There is no granulation within the wound bed. There is a  large (67-100%) amount of necrotic tissue within the wound bed including Eschar. Wound #11 status is Open. Original cause of wound was Gradually Appeared. The wound is located on the Right,Lateral Knee. The wound measures 0.8cm length x 0.7cm width x 0.1cm depth; 0.44cm^2 area and 0.044cm^3 volume. The wound is limited to skin breakdown. There is no tunneling or undermining noted. There is a none present amount of drainage noted. The wound margin is flat and intact. There is no granulation within the wound bed. There is a large (67-100%) amount of necrotic tissue within the wound bed including Eschar. Wound #6 status is Open. Original cause of wound was Gradually Appeared. The wound is located on the Left,Lateral,Posterior Lower Leg. The wound measures 2.7cm length x 1.6cm width x 0.1cm depth; 3.393cm^2 area and 0.339cm^3 volume. The wound is limited to skin breakdown. There is no tunneling or undermining noted. There is a none present amount of drainage noted. The wound margin is flat and intact. There is no granulation within the wound bed. There is a large (67-100%) amount of necrotic tissue within the wound bed including Eschar. Wound #7 status is Open. Original cause of wound was Gradually Appeared. The wound is located on the Left,Distal,Lateral Lower Leg. The wound measures 0.9cm length x 0.6cm width x 0.3cm depth; 0.424cm^2 area and 0.127cm^3 volume. The wound is limited to skin breakdown. There is no tunneling or undermining noted. There is a none present amount of drainage noted. The wound margin  is flat and intact. There is no granulation within the wound bed. There is a large (67-100%) amount of necrotic tissue within the wound bed including Eschar. Wound #8 status is Open. Original cause of wound was Gradually Appeared. The wound is located on the Left,Dorsal Foot. The wound measures 0.4cm length x 0.4cm width x 0.1cm depth; 0.126cm^2 area and 0.013cm^3 volume. The wound is limited to skin breakdown. There is no tunneling or undermining noted. There is a none present amount of drainage noted. The wound margin is flat and intact. There is no granulation within the wound bed. There is a large (67-100%) amount of necrotic tissue within the wound bed including Eschar. Wound #9 status is Open. Original cause of wound was Gradually Appeared. The wound is located on the Left,Medial Lower Leg. The wound measures 2.3cm length x 1.5cm width x 0.1cm depth; 2.71cm^2 area and 0.271cm^3 volume. There is Fat Layer (Subcutaneous Tissue) Exposed exposed. There is no tunneling or undermining noted. There is a medium amount of serous drainage noted. The wound margin is flat and intact. There is large (67-100%) red, hyper - granulation within the wound bed. There is a small (1-33%) amount of necrotic tissue within the wound bed including Adherent Slough. Assessment KWABENA, STRUTZ ([id number]) Active Problems ICD-10 Type 2 diabetes mellitus with other skin ulcer Non-pressure chronic ulcer of other part of left lower leg with fat layer exposed Non-pressure chronic ulcer of other part of left lower leg with other specified severity Non-pressure chronic ulcer of other part of right lower leg with other specified severity Opioid dependence, uncomplicated Essential (primary) hypertension Chronic viral hepatitis C Procedures Wound #10 Pre-procedure diagnosis of Wound #10 is a Diabetic Wound/Ulcer of the Lower Extremity located on the Left,Lateral Upper Leg .Severity of Tissue Pre Debridement is: Limited  to breakdown of skin. There was a Chemical/Enzymatic/Mechanical debridement performed by STONE III, Elleanor Guyett E., PA-C. With the following instrument(s): santyl. Agent used was [employer]. A time out was conducted at 15:08, prior to the start  of the procedure. There was no bleeding. The procedure was tolerated well. Post Debridement Measurements: 1.3cm length x 1cm width x 0.2cm depth; 0.204cm^3 volume. Character of Wound/Ulcer Post Debridement is stable. Severity of Tissue Post Debridement is: Limited to breakdown of skin. Post procedure Diagnosis Wound #10: Same as Pre-Procedure Wound #11 Pre-procedure diagnosis of Wound #11 is a Diabetic Wound/Ulcer of the Lower Extremity located on the Right,Lateral Knee .Severity of Tissue Pre Debridement is: Limited to breakdown of skin. There was a Chemical/Enzymatic/Mechanical debridement performed by STONE III, Hakan Nudelman E., PA-C. With the following instrument(s): santyl. Agent used was [employer]. A time out was conducted at 15:08, prior to the start of the procedure. There was no bleeding. The procedure was tolerated well. Post Debridement Measurements: 0.8cm length x 0.7cm width x 0.1cm depth; 0.044cm^3 volume. Character of Wound/Ulcer Post Debridement is stable. Severity of Tissue Post Debridement is: Limited to breakdown of skin. Post procedure Diagnosis Wound #11: Same as Pre-Procedure Wound #6 Pre-procedure diagnosis of Wound #6 is a Diabetic Wound/Ulcer of the Lower Extremity located on the Left,Lateral,Posterior Lower Leg .Severity of Tissue Pre Debridement is: Limited to breakdown of skin. There was a Chemical/Enzymatic/Mechanical debridement performed by STONE III, Jaydrien Wassenaar E., PA-C. With the following instrument(s): santyl. Agent used was [employer]. A time out was conducted at 15:08, prior to the start of the procedure. There was no bleeding. The procedure was tolerated well. Post Debridement Measurements: 2.7cm length x 1.6cm width x 0.1cm depth;  0.339cm^3 volume. Character of Wound/Ulcer Post Debridement is stable. Severity of Tissue Post Debridement is: Limited to breakdown of skin. Post procedure Diagnosis Wound #6: Same as Pre-Procedure Wound #7 Pre-procedure diagnosis of Wound #7 is a Diabetic Wound/Ulcer of the Lower Extremity located on the Left,Distal,Lateral Lower Leg .Severity of Tissue Pre Debridement is: Fat layer exposed. There was a Chemical/Enzymatic/Mechanical debridement performed by STONE III, Giovanni Biby E., PA-C. With the following instrument(s): santyl. Agent used was [employer]. A time out was conducted at 15:08, prior to the start of the procedure. There was no bleeding. The procedure was tolerated well. Post Debridement Measurements: 0.9cm length x 0.6cm width x 0.3cm depth; 0.127cm^3 volume. Character of Wound/Ulcer Post Debridement is stable. Severity of Tissue Post Debridement is: Fat layer exposed. Post procedure Diagnosis Wound #7: Same as Pre-Procedure EZEKIEL, MENZER ([id number]) Wound #8 Pre-procedure diagnosis of Wound #8 is a Diabetic Wound/Ulcer of the Lower Extremity located on the Left,Dorsal Foot .Severity of Tissue Pre Debridement is: Limited to breakdown of skin. There was a Chemical/Enzymatic/Mechanical debridement performed by STONE III, Colbi Staubs E., PA-C. With the following instrument(s): santyl. Agent used was [employer]. A time out was conducted at 15:08, prior to the start of the procedure. There was no bleeding. The procedure was tolerated well. Post Debridement Measurements: 0.4cm length x 0.4cm width x 0.1cm depth; 0.013cm^3 volume. Character of Wound/Ulcer Post Debridement is stable. Severity of Tissue Post Debridement is: Limited to breakdown of skin. Post procedure Diagnosis Wound #8: Same as Pre-Procedure Wound #9 Pre-procedure diagnosis of Wound #9 is a Diabetic Wound/Ulcer of the Lower Extremity located on the Left,Medial Lower Leg .Severity of Tissue Pre Debridement is: Limited to breakdown of skin.  There was a Chemical/Enzymatic/Mechanical debridement performed by STONE III, Aayliah Rotenberry E., PA-C. With the following instrument(s): santyl. Agent used was [employer]. A time out was conducted at 15:08, prior to the start of the procedure. There was no bleeding. The procedure was tolerated well. Post Debridement Measurements: 2.3cm length x 1.5cm width x 0.1cm depth; 0.271cm^3 volume. Character  of Wound/Ulcer Post Debridement is stable. Severity of Tissue Post Debridement is: Limited to breakdown of skin. Post procedure Diagnosis Wound #9: Same as Pre-Procedure Plan Wound Cleansing: Wound #10 Left,Lateral Upper Leg: Other: - hibi-cleanse Wound #11 Right,Lateral Knee: Other: - hibi-cleanse Wound #6 Left,Lateral,Posterior Lower Leg: Other: - hibi-cleanse Wound #7 Left,Distal,Lateral Lower Leg: Other: - hibi-cleanse Wound #8 Left,Dorsal Foot: Other: - hibi-cleanse Wound #9 Left,Medial Lower Leg: Other: - hibi-cleanse Primary Wound Dressing: Wound #10 Left,Lateral Upper Leg: Santyl Ointment Wound #11 Right,Lateral Knee: Santyl Ointment Wound #6 Left,Lateral,Posterior Lower Leg: Santyl Ointment Wound #7 Left,Distal,Lateral Lower Leg: Santyl Ointment Wound #8 Left,Dorsal Foot: Santyl Ointment Wound #9 Left,Medial Lower Leg: Santyl Ointment Secondary Dressing: Wound #10 Left,Lateral Upper Leg: Other - coverlet Wound #11 Right,Lateral Knee: Other - coverlet Wound #6 Left,Lateral,Posterior Lower Leg: Other - coverlet Tony Long, Jamil ([id number]) Wound #7 Left,Distal,Lateral Lower Leg: Other - coverlet Wound #8 Left,Dorsal Foot: Other - coverlet Wound #9 Left,Medial Lower Leg: Other - coverlet Dressing Change Frequency: Wound #10 Left,Lateral Upper Leg: Change dressing every day. Wound #11 Right,Lateral Knee: Change dressing every day. Wound #6 Left,Lateral,Posterior Lower Leg: Change dressing every day. Wound #7 Left,Distal,Lateral Lower Leg: Change dressing every day. Wound #8  Left,Dorsal Foot: Change dressing every day. Wound #9 Left,Medial Lower Leg: Change dressing every day. Follow-up Appointments: Wound #10 Left,Lateral Upper Leg: Return Appointment in 1 week. Wound #11 Right,Lateral Knee: Return Appointment in 1 week. Wound #6 Left,Lateral,Posterior Lower Leg: Return Appointment in 1 week. Wound #7 Left,Distal,Lateral Lower Leg: Return Appointment in 1 week. Wound #8 Left,Dorsal Foot: Return Appointment in 1 week. Wound #9 Left,Medial Lower Leg: Return Appointment in 1 week. The following medication(s) was prescribed: Santyl topical 250 unit/gram ointment ointment topical applied nickel thick to the wound bed and then cover with a dressing as directed starting 01/09/2019 My suggestion at this time is gonna be that we go ahead and initiate the above wound care measures for the next week. I do think the Santyl is gonna be appropriate for the patient. Subsequently I will go ahead and send this into the pharmacy for him with two refills. Subsequently will see were things stand in one weeks time will see him back for reevaluation. If anything changes or worsens meantime patient will contact the office let me know. Otherwise we will see were things stand at follow- up. Please see above for specific wound care orders. We will see patient for re-evaluation in 1 week(s) here in the clinic. If anything worsens or changes patient will contact our office for additional recommendations. Electronic Signature(s) Signed: 01/09/2019 4:13:38 PM By: Lenda KelpStone III, Rockelle Heuerman PA-C Entered By: Lenda KelpStone III, Andon Villard on 01/09/2019 01:39:41 Tony Long, Cinsere ([id number]) -------------------------------------------------------------------------------- ROS/PFSH Details Patient Name: Tony Long, Kipp Date of Service: 01/06/2019 2:15 PM Medical Record Number: [id number] Patient Account Number: [id number] Date of Birth/Sex: 04/15/1976 (42 y.o. M) Treating RN: Curtis Sitesorthy, Joanna Primary Care Provider:  Raliegh IpSTROUD, NATALIE Other Clinician: Referring Provider: Referral, Self Treating Provider/Extender: STONE III, Markeise Mathews Weeks in Treatment: 0 Information Obtained From Patient Constitutional Symptoms (General Health) Complaints and Symptoms: Negative for: Fatigue; Fever; Chills; Marked Weight Change Eyes Complaints and Symptoms: Negative for: Dry Eyes; Vision Changes; Glasses / Contacts Medical History: Negative for: Cataracts; Glaucoma; Optic Neuritis Ear/Nose/Mouth/Throat Complaints and Symptoms: Negative for: Difficult clearing ears; Sinusitis Medical History: Negative for: Chronic sinus problems/congestion; Middle ear problems Hematologic/Lymphatic Complaints and Symptoms: Negative for: Bleeding / Clotting Disorders; Human Immunodeficiency Virus Medical History: Negative for: Anemia; Hemophilia; Human Immunodeficiency Virus; Lymphedema; Sickle Cell Disease Respiratory  Complaints and Symptoms: Negative for: Chronic or frequent coughs; Shortness of Breath Medical History: Negative for: Aspiration; Asthma; Chronic Obstructive Pulmonary Disease (COPD); Pneumothorax; Sleep Apnea; Tuberculosis Cardiovascular Complaints and Symptoms: Negative for: Chest pain; LE edema Medical History: Negative for: Angina; Arrhythmia; Congestive Heart Failure; Coronary Artery Disease; Deep Vein Thrombosis; Hypertension; Hypotension; Myocardial Infarction; Peripheral Arterial Disease; Peripheral Venous Disease; Phlebitis; Vasculitis KOLLIN, UDELL ([id number]) Gastrointestinal Complaints and Symptoms: Negative for: Frequent diarrhea; Nausea; Vomiting Medical History: Negative for: Cirrhosis ; Colitis; Crohnos; Hepatitis A; Hepatitis B; Hepatitis C Endocrine Complaints and Symptoms: Negative for: Hepatitis; Thyroid disease; Polydypsia (Excessive Thirst) Medical History: Positive for: Type II Diabetes - 7 years Negative for: Type I Diabetes Time with diabetes: 7 years Treated with:  Insulin Genitourinary Complaints and Symptoms: Negative for: Kidney failure/ Dialysis; Incontinence/dribbling Medical History: Negative for: End Stage Renal Disease Immunological Complaints and Symptoms: Negative for: Hives; Itching Medical History: Negative for: Lupus Erythematosus; Raynaudos; Scleroderma Integumentary (Skin) Complaints and Symptoms: Negative for: Wounds; Bleeding or bruising tendency; Breakdown; Swelling Medical History: Negative for: History of Burn; History of pressure wounds Musculoskeletal Complaints and Symptoms: Negative for: Muscle Pain; Muscle Weakness Medical History: Negative for: Gout; Rheumatoid Arthritis; Osteoarthritis; Osteomyelitis Neurologic Complaints and Symptoms: Negative for: Numbness/parasthesias; Focal/Weakness Medical History: Negative for: Dementia; Neuropathy; Quadriplegia; Paraplegia; Seizure Disorder FERREL, SIMINGTON ([id number]) Psychiatric Complaints and Symptoms: Negative for: Anxiety; Claustrophobia Oncologic Medical History: Negative for: Received Chemotherapy; Received Radiation Immunizations Pneumococcal Vaccine: Received Pneumococcal Vaccination: No Implantable Devices None Family and Social History Cancer: Yes - Maternal Grandparents; Diabetes: Yes - Father; Heart Disease: Yes - Father; Hereditary Spherocytosis: No; Hypertension: No; Kidney Disease: No; Lung Disease: No; Seizures: No; Stroke: No; Thyroid Problems: No; Tuberculosis: No; Current every day smoker; Marital Status - Single; Alcohol Use: Moderate; Drug Use: Prior History; Caffeine Use: Daily; Financial Concerns: No; Food, Clothing or Shelter Needs: No; Support System Lacking: No; Transportation Concerns: No Electronic Signature(s) Signed: 01/06/2019 4:02:59 PM By: Curtis Sites Signed: 01/09/2019 4:13:38 PM By: Lenda Kelp PA-C Entered By: Curtis Sites on 01/06/2019 14:18:00 Tony Long  ([id number]) -------------------------------------------------------------------------------- SuperBill Details Patient Name: Tony Long Date of Service: 01/06/2019 Medical Record Number: [id number] Patient Account Number: [id number] Date of Birth/Sex: 08-Dec-1975 (43 y.o. M) Treating RN: Arnette Norris Primary Care Provider: Raliegh Ip Other Clinician: Referring Provider: Referral, Self Treating Provider/Extender: Linwood Dibbles, Taneka Espiritu Weeks in Treatment: 0 Diagnosis Coding ICD-10 Codes Code Description E11.622 Type 2 diabetes mellitus with other skin ulcer L97.822 Non-pressure chronic ulcer of other part of left lower leg with fat layer exposed L97.828 Non-pressure chronic ulcer of other part of left lower leg with other specified severity L97.818 Non-pressure chronic ulcer of other part of right lower leg with other specified severity F11.20 Opioid dependence, uncomplicated I10 Essential (primary) hypertension B18.2 Chronic viral hepatitis C Facility Procedures CPT4 Code: 62952841 Description: 99213 - WOUND CARE VISIT-LEV 3 EST PT Modifier: Quantity: 1 CPT4 Code: 32440102 Description: 72536 - DEBRIDE W/O ANES NON SELECT Modifier: Quantity: 1 CPT4 Code: 64403474 Description: 25956 - DEBRIDE W/O ANES NON SELECT Modifier: Quantity: 1 CPT4 Code: 38756433 Description: 29518 - DEBRIDE W/O ANES NON SELECT Modifier: Quantity: 1 CPT4 Code: 84166063 Description: 01601 - DEBRIDE W/O ANES NON SELECT Modifier: Quantity: 1 CPT4 Code: 09323557 Description: 32202 - DEBRIDE W/O ANES NON SELECT Modifier: Quantity: 1 CPT4 Code: 54270623 Description: 76283 - DEBRIDE W/O ANES NON SELECT Modifier: Quantity: 1 Physician Procedures CPT4 Code Description: [id number] 99214 - WC PHYS LEVEL 4 - EST PT ICD-10 Diagnosis Description E11.622 Type 2 diabetes  mellitus with other skin ulcer L97.822 Non-pressure chronic ulcer of other part of left lower leg with L97.828 Non-pressure chronic ulcer   of other part of left lower leg with L97.818 Non-pressure chronic ulcer of other part of right lower leg wit Modifier: fat layer exposed other specified s h other specified Quantity: 1 everity severity Electronic Signature(s) Signed: 01/09/2019 4:13:38 PM By: Lenda Kelp PA-C Entered By: Lenda Kelp on 01/06/2019 21:44:09 Tony Long ([id number])

## 2019-01-09 NOTE — Progress Notes (Addendum)
Tony Long ([id number]) Visit Report for 01/06/2019 Allergy List Details Patient Name: Tony Long, Tony Long Date of Service: 01/06/2019 2:15 PM Medical Record Number: [id number] Patient Account Number: [id number] Date of Birth/Sex: Nov 02, 1975 (43 y.o. M) Treating RN: Curtis Sites Primary Care Adalind Weitz: Raliegh Ip Other Clinician: Referring Kenlei Safi: Referral, Self Treating Makenli Derstine/Extender: STONE III, HOYT Weeks in Treatment: 0 Allergies Active Allergies No Known Drug Allergies Allergy Notes Electronic Signature(s) Signed: 01/06/2019 4:02:59 PM By: Curtis Sites Entered By: Curtis Sites on 01/06/2019 14:16:56 Tony Long ([id number]) -------------------------------------------------------------------------------- Arrival Information Details Patient Name: Tony Long Date of Service: 01/06/2019 2:15 PM Medical Record Number: [id number] Patient Account Number: [id number] Date of Birth/Sex: [birth year] (43 y.o. M) Treating RN: Curtis Sites Primary Care Mialani Reicks: Raliegh Ip Other Clinician: Referring Elba Schaber: Referral, Self Treating Britney Captain/Extender: Linwood Dibbles, HOYT Weeks in Treatment: 0 Visit Information Patient Arrived: Ambulatory Arrival Time: 14:13 Accompanied By: self Transfer Assistance: None Patient Identification Verified: Yes Secondary Verification Process Completed: Yes Patient Has Alerts: Yes Patient Alerts: DMII History Since Last Visit Added or deleted any medications: No Any new allergies or adverse reactions: No Had a fall or experienced change in activities of daily living that may affect risk of falls: No Signs or symptoms of abuse/neglect since last visito No Hospitalized since last visit: No Implantable device outside of the clinic excluding cellular tissue based products placed in the center since last visit: No Electronic Signature(s) Signed: 01/06/2019 4:02:59 PM By: Curtis Sites Entered By: Curtis Sites on 01/06/2019  14:14:22 Tony Long ([id number]) -------------------------------------------------------------------------------- Clinic Level of Care Assessment Details Patient Name: Tony Long Date of Service: 01/06/2019 2:15 PM Medical Record Number: [id number] Patient Account Number: [id number] Date of Birth/Sex: 02-07-1976 (43 y.o. M) Treating RN: Rodell Perna Primary Care Alixandra Alfieri: Raliegh Ip Other Clinician: Referring Madoc Holquin: Referral, Self Treating Kamaiyah Uselton/Extender: STONE III, HOYT Weeks in Treatment: 0 Clinic Level of Care Assessment Items TOOL 1 Quantity Score  - Use when EandM and Procedure is performed on INITIAL visit 0 ASSESSMENTS - Nursing Assessment / Reassessment X - General Physical Exam (combine w/ comprehensive assessment (listed just below) when 1 20 performed on new pt. evals) X- 1 25 Comprehensive Assessment (HX, ROS, Risk Assessments, Wounds Hx, etc.) ASSESSMENTS - Wound and Skin Assessment / Reassessment  - Dermatologic / Skin Assessment (not related to wound area) 0 ASSESSMENTS - Ostomy and/or Continence Assessment and Care  - Incontinence Assessment and Management 0  - 0 Ostomy Care Assessment and Management (repouching, etc.) PROCESS - Coordination of Care X - Simple Patient / Family Education for ongoing care 1 15  - 0 Complex (extensive) Patient / Family Education for ongoing care X- 1 10 Staff obtains Chiropractor, Records, Test Results / Process Orders  - 0 Staff telephones HHA, Nursing Homes / Clarify orders / etc  - 0 Routine Transfer to another Facility (non-emergent condition)  - 0 Routine Hospital Admission (non-emergent condition) X- 1 15 New Admissions / Manufacturing engineer / Ordering NPWT, Apligraf, etc.  - 0 Emergency Hospital Admission (emergent condition) PROCESS - Special Needs  - Pediatric / Minor Patient Management 0  - 0 Isolation Patient Management  - 0 Hearing / Language / Visual special  needs  - 0 Assessment of Community assistance (transportation, D/C planning, etc.)  - 0 Additional assistance / Altered mentation  - 0 Support Surface(s) Assessment (bed, cushion, seat, etc.) Tony Long ([id number]) INTERVENTIONS - Miscellaneous  - External ear exam 0  - 0 Patient Transfer (multiple staff / Nurse, adult / Similar devices)  - 0 Simple  Staple / Suture removal (25 or less) []  - 0 Complex Staple / Suture removal (26 or more) []  - 0 Hypo/Hyperglycemic Management (do not check if billed separately) []  - 0 Ankle / Brachial Index (ABI) - do not check if billed separately Has the patient been seen at the hospital within the last three years: Yes Total Score: 85 Level Of Care: New/Established - Level 3 Electronic Signature(s) Signed: 01/06/2019 3:37:18 PM By: Rodell PernaScott, Dajea Entered By: Rodell PernaScott, Dajea on 01/06/2019 15:14:23 Tony Long, Tony Long ([id number]) -------------------------------------------------------------------------------- Encounter Discharge Information Details Patient Name: Tony Long, Tony Long Date of Service: 01/06/2019 2:15 PM Medical Record Number: [id number] Patient Account Number: [id number] Date of Birth/Sex: 06/24/1976 (43 y.o. M) Treating RN: Curtis Sitesorthy, Joanna Primary Care Marji Kuehnel: Raliegh IpSTROUD, NATALIE Other Clinician: Referring Travas Schexnayder: Referral, Self Treating Azarias Chiou/Extender: Linwood DibblesSTONE III, HOYT Weeks in Treatment: 0 Encounter Discharge Information Items Post Procedure Vitals Discharge Condition: Stable Temperature (F): 98.2 Ambulatory Status: Ambulatory Pulse (bpm): 89 Discharge Destination: Home Respiratory Rate (breaths/min): 18 Transportation: Private Auto Blood Pressure (mmHg): 123/76 Accompanied By: self Schedule Follow-up Appointment: Yes Clinical Summary of Care: Electronic Signature(s) Signed: 01/06/2019 3:27:42 PM By: Curtis Sitesorthy, Joanna Entered By: Curtis Sitesorthy, Joanna on 01/06/2019 15:27:42 Tony Long, Tony Long  ([id number]) -------------------------------------------------------------------------------- Lower Extremity Assessment Details Patient Name: Tony Long, Tony Long Date of Service: 01/06/2019 2:15 PM Medical Record Number: [id number] Patient Account Number: [id number] Date of Birth/Sex: 08/04/1975 (43 y.o. M) Treating RN: Curtis Sitesorthy, Joanna Primary Care Tashanti Dalporto: Raliegh IpSTROUD, NATALIE Other Clinician: Referring Shenekia Riess: Referral, Self Treating Akira Perusse/Extender: STONE III, HOYT Weeks in Treatment: 0 Edema Assessment Assessed: [Left: No] [Right: No] Edema: [Left: No] [Right: No] Calf Left: Right: Point of Measurement: 34 cm From Medial Instep 31 cm 31 cm Ankle Left: Right: Point of Measurement: 12 cm From Medial Instep 22.5 cm 21.2 cm Vascular Assessment Pulses: Dorsalis Pedis Palpable: [Left:Yes] [Right:Yes] Doppler Audible: [Left:Yes] [Right:Yes] Posterior Tibial Palpable: [Left:Yes] [Right:Yes] Doppler Audible: [Left:Yes] [Right:Yes] Blood Pressure: Brachial: [Left:132] [Right:128] Dorsalis Pedis: 160 [Left:Dorsalis Pedis: 150] Ankle: Posterior Tibial: 156 [Left:Posterior Tibial: 158 1.21] [Right:1.20] Electronic Signature(s) Signed: 01/06/2019 4:02:59 PM By: Curtis Sitesorthy, Joanna Entered By: Curtis Sitesorthy, Joanna on 01/06/2019 14:52:14 Tony Long, Tony Long ([id number]) -------------------------------------------------------------------------------- Multi Wound Chart Details Patient Name: Tony Long, Tony Long Date of Service: 01/06/2019 2:15 PM Medical Record Number: [id number] Patient Account Number: [id number] Date of Birth/Sex: 04/20/1976 (43 y.o. M) Treating RN: Rodell PernaScott, Dajea Primary Care Dennison Mcdaid: Raliegh IpSTROUD, NATALIE Other Clinician: Referring Jill Stopka: Referral, Self Treating Guerin Lashomb/Extender: STONE III, HOYT Weeks in Treatment: 0 Vital Signs Height(in): 71 Pulse(bpm): 89 Weight(lbs): 155 Blood Pressure(mmHg): 123/76 Body Mass Index(BMI): 22 Temperature(F): 98.2 Respiratory  Rate 16 (breaths/min): Photos: Wound Location: Left Upper Leg - Lateral Right Knee - Lateral Left Lower Leg - Lateral, Posterior Wounding Event: Gradually Appeared Gradually Appeared Gradually Appeared Primary Etiology: Diabetic Wound/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 Lower Extremity Lower Extremity Comorbid History: Type II Diabetes Type II Diabetes Type II Diabetes Date Acquired: 12/06/2018 12/06/2018 12/07/2018 Weeks of Treatment: 0 0 0 Wound Status: Open Open Open Measurements L x W x D 1.3x1x0.2 0.8x0.7x0.1 2.7x1.6x0.1 (cm) Area (cm) : 1.021 0.44 3.393 Volume (cm) : 0.204 0.044 0.339 Classification: Unable to visualize wound bed Unable to visualize wound bed Unable to visualize wound bed Exudate Amount: None Present None Present None Present Exudate Type: N/A N/A N/A Exudate Color: N/A N/A N/A Wound Margin: Flat and Intact Flat and Intact Flat and Intact Granulation Amount: None Present (0%) None Present (0%) None Present (0%) Granulation Quality: N/A N/A N/A Necrotic Amount: Large (67-100%) Large (67-100%) Large (  67-100%) Necrotic Tissue: Eschar Eschar Eschar Exposed Structures: Fascia: No Fascia: No Fascia: No Fat Layer (Subcutaneous Fat Layer (Subcutaneous Fat Layer (Subcutaneous Tissue) Exposed: No Tissue) Exposed: No Tissue) Exposed: No Tendon: No Tendon: No Tendon: No Muscle: No Muscle: No Muscle: No Joint: No Joint: No Joint: No Tony Long, Jamaine ([id number]) Bone: No Bone: No Bone: No Limited to Skin Breakdown Limited to Skin Breakdown Limited to Skin Breakdown Epithelialization: None None None Wound Number: [id number] [phone] [id number]  Photos: Wound Location: Left Lower Leg - Lateral, Distal Left Foot - Dorsal Left Lower Leg - Medial Wounding Event: Gradually Appeared Gradually Appeared Gradually Appeared Primary Etiology: Diabetic Wound/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 Lower  Extremity Lower Extremity Comorbid History: Type II Diabetes Type II Diabetes Type II Diabetes Date Acquired: 12/06/2018 12/06/2018 11/01/2018 Weeks of Treatment: 0 0 0 Wound Status: Open Open Open Measurements L x W x D 0.9x0.6x0.3 0.4x0.4x0.1 2.3x1.5x0.1 (cm) Area (cm) : 0.424 0.126 2.71 Volume (cm) : 0.127 0.013 0.271 Classification: Unable to visualize wound bed Unable to visualize wound bed Grade 1 Exudate Amount: None Present None Present Medium Exudate Type: N/A N/A Serous Exudate Color: N/A N/A amber Wound Margin: Flat and Intact Flat and Intact Flat and Intact Granulation Amount: None Present (0%) None Present (0%) Large (67-100%) Granulation Quality: N/A N/A Red, Hyper-granulation Necrotic Amount: Large (67-100%) Large (67-100%) Small (1-33%) Necrotic Tissue: Eschar Eschar Adherent Slough Exposed Structures: Fascia: No Fascia: No Fat Layer (Subcutaneous Fat Layer (Subcutaneous Fat Layer (Subcutaneous Tissue) Exposed: Yes Tissue) Exposed: No Tissue) Exposed: No Fascia: No Tendon: No Tendon: No Tendon: No Muscle: No Muscle: No Muscle: No Joint: No Joint: No Joint: No Bone: No Bone: No Bone: No Limited to Skin Breakdown Limited to Skin Breakdown Epithelialization: None None Small (1-33%) Treatment Notes Electronic Signature(s) Signed: 01/06/2019 3:37:18 PM By: Rodell PernaScott, Dajea Entered By: Rodell PernaScott, Dajea on 01/06/2019 15:02:15 Tony Long, Sandon ([id number]) -------------------------------------------------------------------------------- Multi-Disciplinary Care Plan Details Patient Name: Tony Long, Tony Long Date of Service: 01/06/2019 2:15 PM Medical Record Number: [id number] Patient Account Number: [id number] Date of Birth/Sex: 12/25/1975 (43 y.o. M) Treating RN: Rodell PernaScott, Dajea Primary Care Junita Kubota: Raliegh IpSTROUD, NATALIE Other Clinician: Referring Sirus Labrie: Referral, Self Treating Jhene Westmoreland/Extender: Linwood DibblesSTONE III, HOYT Weeks in Treatment: 0 Active Inactive Electronic  Signature(s) Signed: 01/31/2019 10:33:10 AM By: Elliot GurneyWoody, BSN, RN, CWS, Kim RN, BSN Signed: 02/25/2019 9:36:50 AM By: Rodell PernaScott, Dajea Previous Signature: 01/06/2019 3:37:18 PM Version By: Rodell PernaScott, Dajea Entered By: Elliot GurneyWoody, BSN, RN, CWS, Kim on 01/31/2019 10:33:10 Tony Long, Kaelin ([id number]) -------------------------------------------------------------------------------- Pain Assessment Details Patient Name: Tony Long, Dacian Date of Service: 01/06/2019 2:15 PM Medical Record Number: [id number] Patient Account Number: [id number] Date of Birth/Sex: 10/09/1975 (43 y.o. M) Treating RN: Curtis Sitesorthy, Joanna Primary Care Waco Foerster: Raliegh IpSTROUD, NATALIE Other Clinician: Referring Nadea Kirkland: Referral, Self Treating Caylan Schifano/Extender: Linwood DibblesSTONE III, HOYT Weeks in Treatment: 0 Active Problems Location of Pain Severity and Description of Pain Patient Has Paino No Site Locations Pain Management and Medication Current Pain Management: Electronic Signature(s) Signed: 01/06/2019 4:02:59 PM By: Curtis Sitesorthy, Joanna Entered By: Curtis Sitesorthy, Joanna on 01/06/2019 14:15:31 Tony Long, Levone ([id number]) -------------------------------------------------------------------------------- Patient/Caregiver Education Details Patient Name: Tony Long, Jemarcus Date of Service: 01/06/2019 2:15 PM Medical Record Number: [id number] Patient Account Number: [id number] Date of Birth/Gender: 06/16/1976 (43 y.o. M) Treating RN: Rodell PernaScott, Dajea Primary Care Physician: Raliegh IpSTROUD, NATALIE Other Clinician: Referring Physician: Referral, Self Treating Physician/Extender: Skeet SimmerSTONE III, HOYT Weeks in Treatment: 0 Education Assessment Education Provided To: Patient Education Topics Provided [facility]: Handouts: [facility] Methods: Demonstration, Explain/Verbal Responses: State content correctly Wound/Skin Impairment: Handouts: Caring for Your Ulcer Methods: Demonstration, Explain/Verbal Responses: State content  correctly Electronic Signature(s) Signed: 01/06/2019 3:37:18 PM By: Rodell PernaScott, Dajea Entered By: Rodell PernaScott, Dajea on 01/06/2019 15:14:44 Tony Long, Alistair ([id number]) -------------------------------------------------------------------------------- Wound Assessment Details Patient Name: Tony Long, Jancarlos Date of Service: 01/06/2019 2:15 PM Medical Record Number: [id number] Patient Account Number: [id number] Date of Birth/Sex: 03/19/1976 (43 y.o. M) Treating RN: Curtis Sitesorthy, Joanna Primary Care Larico Dimock: Raliegh IpSTROUD, NATALIE Other Clinician: Referring Belissa Kooy: Referral, Self Treating Gudrun Axe/Extender: STONE III, HOYT Weeks in Treatment: 0 Wound Status Wound Number: 10 Primary Etiology: Diabetic Wound/Ulcer of the Lower Extremity Wound Location: Left Upper Leg - Lateral Wound Status: Open Wounding Event: Gradually Appeared Comorbid Type II Diabetes Date Acquired: 12/06/2018 History: Weeks Of Treatment: 0 Clustered Wound: No Photos Wound Measurements Length: (cm) 1.3 % Reduction Width: (cm) 1 % Reduction Depth: (cm) 0.2 Epitheliali Area: (cm) 1.021 Tunneling: Volume: (cm) 0.204 Underminin in Area: in Volume: zation: None No g: No Wound Description Classification: Unable to visualize wound bed Foul Odor A Wound Margin: Flat and Intact Slough/Fibr Exudate Amount: None Present fter Cleansing: No ino No Wound Bed Granulation Amount: None Present (0%) Exposed Structure Necrotic Amount: Large (67-100%) Fascia Exposed: No Necrotic Quality: Eschar Fat Layer (Subcutaneous Tissue) Exposed: No Tendon Exposed: No Muscle Exposed: No Joint Exposed: No Bone Exposed: No Limited to Skin Breakdown Electronic Signature(s) Signed: 01/06/2019 4:02:59 PM By: Horton Chinorthy, Joanna Koelling, Apolinar JunesBRANDON ([id number]) Entered By: Curtis Sitesorthy, Joanna on 01/06/2019 14:43:57 Tony Long, Antonyo ([id number]) -------------------------------------------------------------------------------- Wound Assessment Details Patient Name:  Tony Long, Darrow Date of Service: 01/06/2019 2:15 PM Medical Record Number: [id number] Patient Account Number: [id number] Date of Birth/Sex: 06/24/1976 (43 y.o. M) Treating RN: Curtis Sitesorthy, Joanna Primary Care Violet Cart: Raliegh IpSTROUD, NATALIE Other Clinician: Referring Stefen Juba: Referral, Self Treating Mekel Haverstock/Extender: STONE III, HOYT Weeks in Treatment: 0 Wound Status Wound Number: 11 Primary Etiology: Diabetic Wound/Ulcer of the Lower Extremity Wound Location: Right Knee - Lateral Wound Status: Open Wounding Event: Gradually Appeared Comorbid Type II Diabetes Date Acquired: 12/06/2018 History: Weeks Of Treatment: 0 Clustered Wound: No Photos Wound Measurements Length: (cm) 0.8 % Reduction Width: (cm) 0.7 % Reduction Depth: (cm) 0.1 Epitheliali Area: (cm) 0.44 Tunneling: Volume: (cm) 0.044 Underminin in Area: in Volume: zation: None No g: No Wound Description Classification: Unable to visualize wound bed Foul Odor A Wound Margin: Flat and Intact Slough/Fibr Exudate Amount: None Present fter Cleansing: No ino No Wound Bed Granulation Amount: None Present (0%) Exposed Structure Necrotic Amount: Large (67-100%) Fascia Exposed: No Necrotic Quality: Eschar Fat Layer (Subcutaneous Tissue) Exposed: No Tendon Exposed: No Muscle Exposed: No Joint Exposed: No Bone Exposed: No Limited to Skin Breakdown Electronic Signature(s) Signed: 01/06/2019 4:02:59 PM By: Horton Chinorthy, Joanna Siska, Apolinar JunesBRANDON ([id number]) Entered By: Curtis Sitesorthy, Joanna on 01/06/2019 14:44:57 Tony Long, Issac ([id number]) -------------------------------------------------------------------------------- Wound Assessment Details Patient Name: Tony Long, [place] Date of Service: 01/06/2019 2:15 PM Medical Record Number: [id number] Patient Account Number: [id number] Date of Birth/Sex: 09/11/1975 (43 y.o. M) Treating RN: Curtis Sitesorthy, Joanna Primary Care Chad Donoghue: Raliegh IpSTROUD, NATALIE Other Clinician: Referring Sharley Keeler: Referral,  Self Treating Nastassja Witkop/Extender: STONE III, HOYT Weeks in Treatment: 0 Wound Status Wound Number: 6 Primary Etiology: Diabetic Wound/Ulcer of the Lower Extremity Wound Location: Left Lower Leg - Lateral, Posterior Wound Status: Open Wounding Event: Gradually Appeared Comorbid Type II Diabetes Date Acquired: 12/07/2018 History: Weeks Of Treatment: 0 Clustered Wound: No Photos Wound Measurements Length: (cm) 2.7 Width: (cm) 1.6 Depth: (cm) 0.1 Area: (cm) 3.393 Volume: (cm) 0.339 % Reduction in Area: % Reduction in  Volume: Epithelialization: None Tunneling: No Undermining: No Wound Description Classification: Unable to visualize wound bed Foul Od Wound Margin: Flat and Intact Slough/ Exudate Amount: None Present or After Cleansing: No Fibrino No Wound Bed Granulation Amount: None Present (0%) Exposed Structure Necrotic Amount: Large (67-100%) Fascia Exposed: No Necrotic Quality: Eschar Fat Layer (Subcutaneous Tissue) Exposed: No Tendon Exposed: No Muscle Exposed: No Joint Exposed: No Bone Exposed: No Limited to Skin Breakdown Electronic Signature(s) Signed: 01/06/2019 4:02:59 PM By: Horton Chin, Apolinar Junes ([id number]) Entered By: Curtis Sites on 01/06/2019 14:39:26 Tony Long ([id number]) -------------------------------------------------------------------------------- Wound Assessment Details Patient Name: Tony Long Date of Service: 01/06/2019 2:15 PM Medical Record Number: [id number] Patient Account Number: [id number] Date of Birth/Sex: [birth year] (43 y.o. M) Treating RN: Curtis Sites Primary Care Orin Eberwein: Raliegh Ip Other Clinician: Referring Acelyn Basham: Referral, Self Treating Raahim Shartzer/Extender: STONE III, HOYT Weeks in Treatment: 0 Wound Status Wound Number: 7 Primary Etiology: Diabetic Wound/Ulcer of the Lower Extremity Wound Location: Left Lower Leg - Lateral, Distal Wound Status: Open Wounding Event: Gradually  Appeared Comorbid Type II Diabetes Date Acquired: 12/06/2018 History: Weeks Of Treatment: 0 Clustered Wound: No Photos Wound Measurements Length: (cm) 0.9 Width: (cm) 0.6 Depth: (cm) 0.3 Area: (cm) 0.424 Volume: (cm) 0.127 % Reduction in Area: % Reduction in Volume: Epithelialization: None Tunneling: No Undermining: No Wound Description Classification: Unable to visualize wound bed Foul Odor Wound Margin: Flat and Intact Slough/Fib Exudate Amount: None Present After Cleansing: No rino No Wound Bed Granulation Amount: None Present (0%) Exposed Structure Necrotic Amount: Large (67-100%) Fascia Exposed: No Necrotic Quality: Eschar Fat Layer (Subcutaneous Tissue) Exposed: No Tendon Exposed: No Muscle Exposed: No Joint Exposed: No Bone Exposed: No Limited to Skin Breakdown Electronic Signature(s) Signed: 01/06/2019 4:02:59 PM By: Horton Chin, Apolinar Junes ([id number]) Entered By: Curtis Sites on 01/06/2019 14:40:37 Tony Long ([id number]) -------------------------------------------------------------------------------- Wound Assessment Details Patient Name: Tony Long Date of Service: 01/06/2019 2:15 PM Medical Record Number: [id number] Patient Account Number: [id number] Date of Birth/Sex: [birth year] (43 y.o. M) Treating RN: Curtis Sites Primary Care Zaynab Chipman: Raliegh Ip Other Clinician: Referring Deema Juncaj: Referral, Self Treating Ezrael Sam/Extender: STONE III, HOYT Weeks in Treatment: 0 Wound Status Wound Number: 8 Primary Etiology: Diabetic Wound/Ulcer of the Lower Extremity Wound Location: Left Foot - Dorsal Wound Status: Open Wounding Event: Gradually Appeared Comorbid Type II Diabetes Date Acquired: 12/06/2018 History: Weeks Of Treatment: 0 Clustered Wound: No Photos Wound Measurements Length: (cm) 0.4 % Reduction Width: (cm) 0.4 % Reduction Depth: (cm) 0.1 Epitheliali Area: (cm) 0.126 Tunneling: Volume: (cm) 0.013 Underminin in  Area: in Volume: zation: None No g: No Wound Description Classification: Unable to visualize wound bed Foul Odor A Wound Margin: Flat and Intact Slough/Fibr Exudate Amount: None Present fter Cleansing: No ino No Wound Bed Granulation Amount: None Present (0%) Exposed Structure Necrotic Amount: Large (67-100%) Fascia Exposed: No Necrotic Quality: Eschar Fat Layer (Subcutaneous Tissue) Exposed: No Tendon Exposed: No Muscle Exposed: No Joint Exposed: No Bone Exposed: No Limited to Skin Breakdown Electronic Signature(s) Signed: 01/06/2019 4:02:59 PM By: Horton Chin, Apolinar Junes ([id number]) Entered By: Curtis Sites on 01/06/2019 14:41:40 Tony Long ([id number]) -------------------------------------------------------------------------------- Wound Assessment Details Patient Name: Tony Long Date of Service: 01/06/2019 2:15 PM Medical Record Number: [id number] Patient Account Number: [id number] Date of Birth/Sex: 01-02-1976 (43 y.o. M) Treating RN: Curtis Sites Primary Care Britany Callicott: Raliegh Ip Other Clinician: Referring Senita Corredor: Referral, Self Treating Jia Dottavio/Extender: STONE III, HOYT Weeks in Treatment: 0 Wound Status Wound Number: 9 Primary Etiology: Diabetic Wound/Ulcer of the Lower Extremity Wound Location: Left  Lower Leg - Medial Wound Status: Open Wounding Event: Gradually Appeared Comorbid Type II Diabetes Date Acquired: 11/01/2018 History: Weeks Of Treatment: 0 Clustered Wound: No Photos Wound Measurements Length: (cm) 2.3 % Reduction i Width: (cm) 1.5 % Reduction i Depth: (cm) 0.1 Epithelializa Area: (cm) 2.71 Tunneling: Volume: (cm) 0.271 Undermining: n Area: n Volume: tion: Small (1-33%) No No Wound Description Classification: Grade 1 Foul Odor Aft Wound Margin: Flat and Intact Slough/Fibrin Exudate Amount: Medium Exudate Type: Serous Exudate Color: amber er Cleansing: No o Yes Wound Bed Granulation Amount: Large  (67-100%) Exposed Structure Granulation Quality: Red, Hyper-granulation Fascia Exposed: No Necrotic Amount: Small (1-33%) Fat Layer (Subcutaneous Tissue) Exposed: Yes Necrotic Quality: Adherent Slough Tendon Exposed: No Muscle Exposed: No Joint Exposed: No Bone Exposed: No Electronic Signature(s) RONDY, KRUPINSKI ([id number]) Signed: 01/06/2019 4:02:59 PM By: Montey Hora Entered By: Montey Hora on 01/06/2019 14:42:50 [name] ([id number]) -------------------------------------------------------------------------------- [place] Details Patient Name: [name] Date of Service: 01/06/2019 2:15 PM Medical Record Number: [id number] Patient Account Number: [id number] Date of Birth/Sex: [birth year] (43 y.o. M) Treating RN: Montey Hora Primary Care Alaiza Yau: Kathe Becton Other Clinician: Referring Nikaela Coyne: Referral, Self Treating Webb Weed/Extender: STONE III, HOYT Weeks in Treatment: 0 Vital Signs Time Taken: 14:15 Temperature (F): 98.2 Height (in): 71 Pulse (bpm): 89 Source: Measured Respiratory Rate (breaths/min): 16 Weight (lbs): 155 Blood Pressure (mmHg): 123/76 Source: Measured Reference Range: 80 - 120 mg / dl Body Mass Index (BMI): 21.6 Electronic Signature(s) Signed: 01/06/2019 4:02:59 PM By: Montey Hora Entered By: Montey Hora on 01/06/2019 14:16:37

## 2019-01-13 ENCOUNTER — Ambulatory Visit: Payer: Medicaid Other | Admitting: Physician Assistant

## 2019-01-20 ENCOUNTER — Ambulatory Visit: Payer: Medicaid Other | Admitting: Physician Assistant

## 2019-03-28 ENCOUNTER — Ambulatory Visit (INDEPENDENT_AMBULATORY_CARE_PROVIDER_SITE_OTHER): Payer: Medicaid Other | Admitting: Family Medicine

## 2019-03-28 ENCOUNTER — Other Ambulatory Visit: Payer: Self-pay

## 2019-03-28 ENCOUNTER — Encounter: Payer: Self-pay | Admitting: Family Medicine

## 2019-03-28 VITALS — BP 136/86 | HR 92 | Temp 98.9°F | Ht 71.0 in | Wt 157.4 lb

## 2019-03-28 DIAGNOSIS — E119 Type 2 diabetes mellitus without complications: Secondary | ICD-10-CM | POA: Diagnosis not present

## 2019-03-28 DIAGNOSIS — Z09 Encounter for follow-up examination after completed treatment for conditions other than malignant neoplasm: Secondary | ICD-10-CM

## 2019-03-28 DIAGNOSIS — S91301A Unspecified open wound, right foot, initial encounter: Secondary | ICD-10-CM

## 2019-03-28 DIAGNOSIS — Z794 Long term (current) use of insulin: Secondary | ICD-10-CM

## 2019-03-28 DIAGNOSIS — I10 Essential (primary) hypertension: Secondary | ICD-10-CM | POA: Diagnosis not present

## 2019-03-28 DIAGNOSIS — N390 Urinary tract infection, site not specified: Secondary | ICD-10-CM

## 2019-03-28 DIAGNOSIS — G629 Polyneuropathy, unspecified: Secondary | ICD-10-CM | POA: Diagnosis not present

## 2019-03-28 LAB — POCT URINALYSIS DIPSTICK
Glucose, UA: POSITIVE — AB
Ketones, UA: POSITIVE
Leukocytes, UA: NEGATIVE
Nitrite, UA: NEGATIVE
Protein, UA: POSITIVE — AB
Spec Grav, UA: 1.015 (ref 1.010–1.025)
Urobilinogen, UA: 0.2 E.U./dL
pH, UA: 6 (ref 5.0–8.0)

## 2019-03-28 LAB — POCT GLYCOSYLATED HEMOGLOBIN (HGB A1C)
HbA1c POC (<> result, manual entry): 11.5 % (ref 4.0–5.6)
HbA1c, POC (controlled diabetic range): 11.5 % — AB (ref 0.0–7.0)
HbA1c, POC (prediabetic range): 11.5 % — AB (ref 5.7–6.4)
Hemoglobin A1C: 11.5 % — AB (ref 4.0–5.6)

## 2019-03-28 LAB — POCT CBG (FASTING - GLUCOSE)-MANUAL ENTRY: Glucose Fasting, POC: 315 mg/dL — AB (ref 70–99)

## 2019-03-28 MED ORDER — SULFAMETHOXAZOLE-TRIMETHOPRIM 800-160 MG PO TABS: 1.0000 | ORAL_TABLET | Freq: Two times a day (BID) | ORAL | 0 refills | Status: AC

## 2019-03-28 MED ORDER — SULFAMETHOXAZOLE-TRIMETHOPRIM 800-160 MG PO TABS: 1.0000 | ORAL_TABLET | Freq: Two times a day (BID) | ORAL | 0 refills | Status: DC

## 2019-03-28 MED ORDER — GABAPENTIN 300 MG PO CAPS: ORAL_CAPSULE | ORAL | 3 refills | Status: DC

## 2019-03-28 NOTE — Progress Notes (Signed)
[facility]   Established Patient Office Visit  Subjective:  Patient ID: Tony Long, male    DOB: 08-08-1975  Age: 43 y.o. MRN: [id number]  CC:  Chief Complaint  Patient presents with   Follow-up     follow up refill on meds     HPI Tony Long is a 43 year old male who presents for Follow Up today.   Past Medical History:  Diagnosis Date   Diabetes mellitus without complication (HCC)    Current Status: Since his last office visit, he is doing well with no complaints. He has not been monitoring his blood glucose levels lately. He denies fatigue, frequent urination, blurred vision, excessive hunger, excessive thirst, weight gain, weight loss, and poor wound healing. He continues to check his feet regularly. He denies fevers, chills, recent infections, weight loss, and night sweats. He has not had any headaches, dizziness, and falls. No chest pain, heart palpitations, cough and shortness of breath reported. No reports of GI problems such as nausea, vomiting, diarrhea, and constipation. He has no reports of blood in stools, dysuria and hematuria. No depression or anxiety reported today. He denies pain today.   Past Surgical History:  Procedure Laterality Date   INCISION AND DRAINAGE ABSCESS N/A 03/22/2018   Procedure: INCISION AND DRAINAGE ABSCESS;  Surgeon: Violeta Gelinas, MD;  Location: [facility] OR;  Service: General;  Laterality: N/A;   TEE WITHOUT CARDIOVERSION  03/22/2018   Procedure: TRANSESOPHAGEAL ECHOCARDIOGRAM (TEE);  Surgeon: Radiologist, Medication, MD;  Location: [facility] OR;  Service: Open Heart Surgery;;    Family History  Problem Relation Age of Onset   Heart disease Father    Diabetes Father    Diabetes Paternal Uncle     Social History   Socioeconomic History   Marital status: Single    Spouse name: Not on file   Number of children: Not on file   Years of education: Not on file   Highest education  level: Not on file  Occupational History   Not on file  Social Needs   Financial resource strain: Not on file   Food insecurity    Worry: Not on file    Inability: Not on file   Transportation needs    Medical: Not on file    Non-medical: Not on file  Tobacco Use   Smoking status: Current Every Day Smoker    Packs/day: 1.00   Smokeless tobacco: Never Used  Substance and Sexual Activity   Alcohol use: Not Currently    Comment: seldom   Drug use: No    Comment: pt mother said hx of use   Sexual activity: Yes    Partners: Female  Lifestyle   Physical activity    Days per week: Not on file    Minutes per session: Not on file   Stress: Not on file  Relationships   Social connections    Talks on phone: Not on file    Gets together: Not on file    Attends religious service: Not on file    Active member of club or organization: Not on file    Attends meetings of clubs or organizations: Not on file    Relationship status: Not on file   Intimate partner violence    Fear of current or ex partner: Not on file    Emotionally abused: Not on file    Physically abused: Not on file    Forced sexual activity: Not on  file  Other Topics Concern   Not on file  Social History Narrative   Not on file    Outpatient Medications Prior to Visit  Medication Sig Dispense Refill   etodolac (LODINE) 500 MG tablet Take 1 tablet (500 mg total) by mouth 2 (two) times daily. 30 tablet 0   gabapentin (NEURONTIN) 300 MG capsule Take 2 capsule (600 mg= total) by mouth, 2 times a day. 120 capsule 3   glucose blood test strip Use as instructed 100 each 12   Insulin Glargine (LANTUS) 100 UNIT/ML Solostar Pen Inject 50 Units into the skin daily at 10 pm. (Patient taking differently: Inject 40 Units into the skin at bedtime. ) 45 mL 3   insulin lispro (HUMALOG) 100 UNIT/ML injection Inject 0.1 mLs (10 Units total) into the skin 3 (three) times daily with meals. 60 mL 1   Lancets  (ONETOUCH ULTRASOFT) lancets Use as instructed 100 each 12   LANTUS 100 UNIT/ML injection INJECT 40 UNITS TOTAL INTO THE SKIN AT BEDTIME. 10 mL 0   lisinopril (PRINIVIL,ZESTRIL) 5 MG tablet Take 1 tablet (5 mg total) by mouth daily. 90 tablet 3   cyclobenzaprine (FLEXERIL) 5 MG tablet Take 1-2 tablets (5-10 mg total) by mouth 3 (three) times daily as needed for muscle spasms. (Patient not taking: Reported on 03/28/2019) 20 tablet 0   HYDROcodone-acetaminophen (NORCO) 5-325 MG tablet Take 1 tablet by mouth every 6 (six) hours as needed for moderate pain. (Patient not taking: Reported on 03/28/2019) 15 tablet 0   ibuprofen (ADVIL,MOTRIN) 600 MG tablet Take 1 tablet (600 mg total) by mouth every 8 (eight) hours as needed. (Patient not taking: Reported on 03/28/2019) 30 tablet 0   metFORMIN (GLUCOPHAGE) 500 MG tablet Take 1 tablet (500 mg total) by mouth 2 (two) times daily with a meal. (Patient not taking: Reported on 03/28/2019) 180 tablet 3   metoprolol tartrate (LOPRESSOR) 50 MG tablet Take 1 tablet (50 mg total) by mouth 2 (two) times daily. (Patient not taking: Reported on 03/28/2019) 60 tablet 0   potassium chloride 20 MEQ TBCR Take 20 mEq by mouth daily. (Patient not taking: Reported on 03/28/2019) 30 tablet 0   potassium chloride SA (K-DUR,KLOR-CON) 20 MEQ tablet Take 1 tablet by mouth daily.  0   pravastatin (PRAVACHOL) 40 MG tablet Take 1 tablet (40 mg total) by mouth daily. (Patient not taking: Reported on 03/28/2019) 90 tablet 3   sildenafil (VIAGRA) 25 MG tablet Take 1 tablet (25 mg total) by mouth daily as needed for erectile dysfunction. (Patient not taking: Reported on 03/28/2019) 90 tablet 3   sulfamethoxazole-trimethoprim (BACTRIM DS,SEPTRA DS) 800-160 MG tablet Take 1 tablet by mouth 2 (two) times daily. (Patient not taking: Reported on 03/28/2019) 14 tablet 0   tamsulosin (FLOMAX) 0.4 MG CAPS capsule Take 1 capsule (0.4 mg total) by mouth daily after supper. (Patient not taking:  Reported on 03/28/2019) 30 capsule 0   No facility-administered medications prior to visit.     No Known Allergies  ROS Review of Systems  Constitutional: Negative.   HENT: Negative.   Eyes: Negative.   Respiratory: Negative.   Cardiovascular: Negative.   Gastrointestinal: Negative.   Endocrine: Negative.   Genitourinary: Negative.   Musculoskeletal: Negative.   Skin: Negative.   Allergic/Immunologic: Negative.   Neurological: Negative.   Hematological: Negative.   Psychiatric/Behavioral: Negative.    Objective:    Physical Exam  Constitutional: He is oriented to person, place, and time. He appears well-developed and well-nourished.  HENT:  Head: Normocephalic and atraumatic.  Eyes: Conjunctivae are normal.  Neck: Normal range of motion. Neck supple.  Cardiovascular: Normal rate, regular rhythm, normal heart sounds and intact distal pulses.  Pulmonary/Chest: Effort normal and breath sounds normal.  Abdominal: Soft. Bowel sounds are normal.  Musculoskeletal: Normal range of motion.  Neurological: He is alert and oriented to person, place, and time. He has normal reflexes.  Skin: Skin is warm and dry.  Psychiatric: He has a normal mood and affect. His behavior is normal. Judgment and thought content normal.  Nursing note and vitals reviewed.  Ht 5\' 11"  (1.803 m)    Wt 157 lb 6.4 oz (71.4 kg)    BMI 21.95 kg/m  Wt Readings from Last 3 Encounters:  03/28/19 157 lb 6.4 oz (71.4 kg)  09/07/18 146 lb (66.2 kg)  05/17/18 150 lb (68 kg)     Health Maintenance Due  Topic Date Due   PNEUMOCOCCAL POLYSACCHARIDE VACCINE AGE 102-64 HIGH RISK  06/06/1978   OPHTHALMOLOGY EXAM  06/06/1986   INFLUENZA VACCINE  01/29/2019   HEMOGLOBIN A1C  03/10/2019    There are no preventive care reminders to display for this patient.  Lab Results  Component Value Date   TSH 3.62 12/11/2016   Lab Results  Component Value Date   WBC 7.2 04/17/2018   HGB 8.1 (L) 04/17/2018   HCT 28.3  (L) 04/17/2018   MCV 96.9 04/17/2018   PLT 397 04/17/2018   Lab Results  Component Value Date   NA 136 04/17/2018   K 4.0 04/17/2018   CO2 28 04/17/2018   GLUCOSE 351 (H) 04/17/2018   BUN 6 04/17/2018   CREATININE 0.76 04/17/2018   BILITOT 0.7 04/02/2018   ALKPHOS 140 (H) 04/02/2018   AST 22 04/02/2018   ALT 14 04/02/2018   PROT 6.5 04/02/2018   ALBUMIN 1.5 (L) 04/02/2018   CALCIUM 8.2 (L) 04/17/2018   ANIONGAP 8 04/17/2018   Lab Results  Component Value Date   CHOL 74 03/31/2018   Lab Results  Component Value Date   HDL 23 (L) 03/31/2018   Lab Results  Component Value Date   LDLCALC 41 03/31/2018   Lab Results  Component Value Date   TRIG 48 03/31/2018   Lab Results  Component Value Date   CHOLHDL 3.2 03/31/2018   Lab Results  Component Value Date   HGBA1C 14.0 (A) 09/07/2018   Assessment & Plan:   1. Type 2 diabetes mellitus without complication, with long-term current use of insulin (HCC) Improved. Hgb A1c is decreased at 11.5 today, from 14.0 on 09/07/2018. He will continue to decrease foods/beverages high in sugars and carbs and follow Heart Healthy or DASH diet. Increase physical activity to at least 30 minutes cardio exercise daily.  - POCT Urinalysis Dipstick - POCT glycosylated hemoglobin (Hb A1C) - Glucose (CBG), Fasting - CBC with Differential - Comprehensive metabolic panel - Lipid Panel - TSH - Vitamin B12 - Vitamin D, 25-hydroxy  2. Wound of right foot We will initiate Bactrim today.  - sulfamethoxazole-trimethoprim (BACTRIM DS) 800-160 MG tablet; Take 1 tablet by mouth 2 (two) times daily for 10 days.  Dispense: 20 tablet; Refill: 0  3. Hypertension, unspecified type The current medical regimen is effective; blood pressure is stable at 136/86 today; continue present plan and medications as prescribed. She will continue to decrease high sodium intake, excessive alcohol intake, increase potassium intake, smoking cessation, and increase  physical activity of at least 30 minutes of cardio  activity daily. She will continue to follow Heart Healthy or DASH diet.  4. Urinary tract infection without hematuria, site unspecified  5. Neuropathy We will initiate Gabapentin today.  - gabapentin (NEURONTIN) 300 MG capsule; Take 2 capsule (600 mg= total) by mouth, 2 times a day.  Dispense: 120 capsule; Refill: 3  6. Follow up He will follow up in 3 months.   Meds ordered this encounter  Medications   DISCONTD: sulfamethoxazole-trimethoprim (BACTRIM DS) 800-160 MG tablet    Sig: Take 1 tablet by mouth 2 (two) times daily for 10 days.    Dispense:  20 tablet    Refill:  0   sulfamethoxazole-trimethoprim (BACTRIM DS) 800-160 MG tablet    Sig: Take 1 tablet by mouth 2 (two) times daily for 10 days.    Dispense:  20 tablet    Refill:  0   gabapentin (NEURONTIN) 300 MG capsule    Sig: Take 2 capsule (600 mg= total) by mouth, 2 times a day.    Dispense:  120 capsule    Refill:  3    Orders Placed This Encounter  Procedures   CBC with Differential   Comprehensive metabolic panel   Lipid Panel   TSH   Vitamin B12   Vitamin D, 25-hydroxy   POCT Urinalysis Dipstick   POCT glycosylated hemoglobin (Hb A1C)   Glucose (CBG), Fasting    Referral Orders  No referral(s) requested today    Raliegh IpNatalie Clifton Safley,  MSN, FNP-BC [facility] [address]  [place], [place] [id number] [phone]- fax  Problem List Items Addressed This Visit    None      No orders of the defined types were placed in this encounter.   Follow-up: No follow-ups on file.    Kallie LocksNatalie M Denney Shein, FNP

## 2019-03-29 DIAGNOSIS — S91301A Unspecified open wound, right foot, initial encounter: Secondary | ICD-10-CM | POA: Insufficient documentation

## 2019-03-29 DIAGNOSIS — G629 Polyneuropathy, unspecified: Secondary | ICD-10-CM | POA: Insufficient documentation

## 2019-06-27 ENCOUNTER — Ambulatory Visit: Payer: Self-pay | Admitting: Family Medicine

## 2019-06-29 ENCOUNTER — Other Ambulatory Visit: Payer: Self-pay

## 2019-06-29 ENCOUNTER — Telehealth: Payer: Self-pay | Admitting: Family Medicine

## 2019-06-29 DIAGNOSIS — G629 Polyneuropathy, unspecified: Secondary | ICD-10-CM

## 2019-06-29 MED ORDER — GABAPENTIN 300 MG PO CAPS: ORAL_CAPSULE | ORAL | 3 refills | Status: DC

## 2019-07-04 ENCOUNTER — Ambulatory Visit: Payer: Self-pay | Admitting: Family Medicine

## 2019-07-04 NOTE — Telephone Encounter (Signed)
done

## 2019-07-29 ENCOUNTER — Other Ambulatory Visit: Payer: Self-pay | Admitting: Family Medicine

## 2019-07-29 NOTE — Telephone Encounter (Signed)
Humalog 100 units refill. Last filled 1 year ago. Please review and advise.

## 2019-08-01 ENCOUNTER — Other Ambulatory Visit: Payer: Self-pay | Admitting: Family Medicine

## 2019-08-02 ENCOUNTER — Telehealth: Payer: Self-pay | Admitting: Family Medicine

## 2019-08-02 NOTE — Telephone Encounter (Signed)
Humalog 100 refill request. Please advise.

## 2019-08-04 NOTE — Telephone Encounter (Signed)
Done

## 2019-08-08 ENCOUNTER — Ambulatory Visit (INDEPENDENT_AMBULATORY_CARE_PROVIDER_SITE_OTHER): Payer: Medicaid Other | Admitting: Family Medicine

## 2019-08-08 ENCOUNTER — Telehealth: Payer: Self-pay | Admitting: Family Medicine

## 2019-08-08 ENCOUNTER — Encounter: Payer: Self-pay | Admitting: Family Medicine

## 2019-08-08 ENCOUNTER — Other Ambulatory Visit: Payer: Self-pay

## 2019-08-08 VITALS — BP 148/91 | HR 86 | Temp 98.7°F | Ht 71.0 in | Wt 155.0 lb

## 2019-08-08 DIAGNOSIS — Z09 Encounter for follow-up examination after completed treatment for conditions other than malignant neoplasm: Secondary | ICD-10-CM

## 2019-08-08 DIAGNOSIS — I10 Essential (primary) hypertension: Secondary | ICD-10-CM

## 2019-08-08 DIAGNOSIS — Z794 Long term (current) use of insulin: Secondary | ICD-10-CM

## 2019-08-08 DIAGNOSIS — R739 Hyperglycemia, unspecified: Secondary | ICD-10-CM

## 2019-08-08 DIAGNOSIS — G629 Polyneuropathy, unspecified: Secondary | ICD-10-CM

## 2019-08-08 DIAGNOSIS — R7309 Other abnormal glucose: Secondary | ICD-10-CM | POA: Diagnosis not present

## 2019-08-08 DIAGNOSIS — S91301A Unspecified open wound, right foot, initial encounter: Secondary | ICD-10-CM

## 2019-08-08 DIAGNOSIS — F1990 Other psychoactive substance use, unspecified, uncomplicated: Secondary | ICD-10-CM

## 2019-08-08 DIAGNOSIS — Z87898 Personal history of other specified conditions: Secondary | ICD-10-CM

## 2019-08-08 DIAGNOSIS — S91302A Unspecified open wound, left foot, initial encounter: Secondary | ICD-10-CM

## 2019-08-08 DIAGNOSIS — E1140 Type 2 diabetes mellitus with diabetic neuropathy, unspecified: Secondary | ICD-10-CM

## 2019-08-08 DIAGNOSIS — S91301D Unspecified open wound, right foot, subsequent encounter: Secondary | ICD-10-CM

## 2019-08-08 DIAGNOSIS — R8290 Unspecified abnormal findings in urine: Secondary | ICD-10-CM

## 2019-08-08 DIAGNOSIS — E119 Type 2 diabetes mellitus without complications: Secondary | ICD-10-CM

## 2019-08-08 LAB — POCT URINALYSIS DIPSTICK
Bilirubin, UA: NEGATIVE
Glucose, UA: POSITIVE — AB
Ketones, UA: 40
Leukocytes, UA: NEGATIVE
Nitrite, UA: NEGATIVE
Protein, UA: NEGATIVE
Spec Grav, UA: 1.01 (ref 1.010–1.025)
Urobilinogen, UA: 0.2 E.U./dL
pH, UA: 6 (ref 5.0–8.0)

## 2019-08-08 LAB — POCT GLYCOSYLATED HEMOGLOBIN (HGB A1C): Hemoglobin A1C: 10.2 % — AB (ref 4.0–5.6)

## 2019-08-08 LAB — GLUCOSE, POCT (MANUAL RESULT ENTRY): POC Glucose: 379 mg/dl — AB (ref 70–99)

## 2019-08-08 MED ORDER — METOPROLOL TARTRATE 50 MG PO TABS: 50.0000 mg | ORAL_TABLET | Freq: Two times a day (BID) | ORAL | 6 refills | Status: DC

## 2019-08-08 MED ORDER — PRAVASTATIN SODIUM 40 MG PO TABS: 40.0000 mg | ORAL_TABLET | Freq: Every day | ORAL | 3 refills | Status: DC

## 2019-08-08 MED ORDER — GABAPENTIN 300 MG PO CAPS: ORAL_CAPSULE | ORAL | 6 refills | Status: DC

## 2019-08-08 MED ORDER — AMOXICILLIN-POT CLAVULANATE 875-125 MG PO TABS: 1.0000 | ORAL_TABLET | Freq: Two times a day (BID) | ORAL | 0 refills | Status: AC

## 2019-08-08 MED ORDER — LISINOPRIL 5 MG PO TABS: 5.0000 mg | ORAL_TABLET | Freq: Every day | ORAL | 3 refills | Status: DC

## 2019-08-08 MED ORDER — JARDIANCE 10 MG PO TABS: 10.0000 mg | ORAL_TABLET | Freq: Every day | ORAL | 3 refills | Status: DC

## 2019-08-08 MED ORDER — INSULIN GLARGINE 100 UNIT/ML SOLOSTAR PEN: 50.0000 [IU] | PEN_INJECTOR | Freq: Every day | SUBCUTANEOUS | 11 refills | Status: DC

## 2019-08-08 MED ORDER — INSULIN LISPRO 100 UNIT/ML ~~LOC~~ SOLN: 10.0000 [IU] | Freq: Three times a day (TID) | SUBCUTANEOUS | 11 refills | Status: DC

## 2019-08-08 NOTE — Telephone Encounter (Signed)
Pt returned a missed call.

## 2019-08-08 NOTE — Telephone Encounter (Signed)
No referral needed. Patient has an appointment on 08/12/2019 at 1pm. He has been informed. ===View-only below this line=== ----- Message ----- From: [name], FNP Sent: 08/08/2019   1:51 PM EST To: Charise Carwin, RMA Subject: "Follow Up"                                    Please contact pharmacy to assess last cream filled for foot wounds.   Patient followed up with Wound Care about 4 months ago for right foot wounds, which have since healed. He now has left foot wounds. Please contact wound care center to assess if another referral needs to be placed.   Thank you.

## 2019-08-08 NOTE — Patient Instructions (Addendum)
Wound Infection A wound infection happens when germs start to grow in a wound. Germs that cause wound infections are most often bacteria. Other types of infections can occur as well. An infection can cause the wound to break open. Wound infections need treatment. If a wound infection is not treated, problems can happen. What are the causes?  Most often caused by germs (bacteria) that grow in a wound.  Other germs, such as yeast and funguses, can also cause wound infections. What increases the risk?  Having a weak body defense system (immune system).  Having diabetes.  Taking certain medicines (steroids) for a long time.  Smoking.  Being an older person.  Being overweight.  Taking certain medicines for cancer treatment. What are the signs or symptoms?  Having more redness, swelling, or pain at the wound site.  Having more blood or fluid at the wound site.  A bad smell coming from a wound or bandage (dressing).  Having a fever.  Feeling very tired.  Having warmth at or around the wound.  Having pus at the wound site. How is this treated?  This condition is most often treated with an antibiotic medicine. ? The infection should improve 24-48 hours after you start antibiotics. ? After 24-48 hours, redness around the wound should stop spreading. The wound should also be less painful. Follow these instructions at home: Medicines  Take or apply over-the-counter and prescription medicines only as told by your doctor.  If you were prescribed an antibiotic medicine, take or apply it as told by your doctor. Do not stop using the antibiotic even if you start to feel better. Wound care   Clean the wound each day, or as told by your doctor. ? Wash the wound with mild soap and water. ? Rinse the wound with water to remove all soap. ? Pat the wound dry with a clean towel. Do not rub it.  Follow instructions from your doctor about how to take care of your wound. Make sure  you: ? Wash your hands with soap and water before and after you change your bandage. If you cannot use soap and water, use hand sanitizer. ? Change your bandage as told by your doctor. ? Leave stitches (sutures), skin glue, or skin tape (adhesive) strips in place if your wound has been closed. They may need to stay in place for 2 weeks or longer. If tape strips get loose and curl up, you may trim the loose edges. Do not remove tape strips completely unless your doctor says it is okay. Some wounds are left open to heal on their own.  Check your wound every day for signs of infection. Watch for: ? More redness, swelling, or pain. ? More fluid or blood. ? Warmth. ? Pus or a bad smell. General instructions  Keep the bandage dry until your doctor says it can be removed.  Do not take baths, swim, or use a hot tub until your doctor approves. Ask your doctor if you may take showers. You may only be allowed to take sponge baths.  Raise (elevate) the injured area above the level of your heart while you are sitting or lying down.  Do not scratch or pick at the wound.  Keep all follow-up visits as told by your doctor. This is important. Contact a doctor if:  Medicine does not help your pain.  You have more redness, swelling, or pain around your wound.  You have more fluid or blood coming from your wound.    Your wound feels warm to the touch.  You have pus coming from your wound.  You notice a bad smell coming from your wound or your bandage.  Your wound that was closed breaks open. Get help right away if:  You have a red streak going away from your wound.  You have a fever. Summary  A wound infection happens when germs start to grow in a wound.  This condition is usually treated with an antibiotic medicine.  Follow instructions from your doctor about how to take care of your wound.  Contact a doctor if your wound infection does not start to get better in 24-48 hours, or your  symptoms get worse.  Keep all follow-up visits as told by your doctor. This is important. This information is not intended to replace advice given to you by your health care provider. Make sure you discuss any questions you have with your health care provider. Document Revised: 01/26/2018 Document Reviewed: 01/26/2018 Else[employer] Patient Education  2020 [employer] Amoxicillin; Clavulanic Acid Chewable Tablets What is this medicine? AMOXICILLIN; CLAVULANIC ACID (a mox i SIL in; KLAV yoo lan ic AS id) is a penicillin antibiotic. It treats some infections caused by bacteria. It will not work for colds, the flu, or other viruses. This medicine may be used for other purposes; ask your health care provider or pharmacist if you have questions. COMMON BRAND NAME(S): Augmentin What should I tell my health care provider before I take this medicine? They need to know if you have any of these conditions:  bowel disease, like colitis  kidney disease  liver disease  mononucleosis  phenylketonuria  an unusual or allergic reaction to amoxicillin, penicillin, cephalosporin, other antibiotics, clavulanic acid, other medicines, foods, dyes, or preservatives  pregnant or trying to get pregnant  breast-feeding How should I use this medicine? Take this drug by mouth. Take it as directed on the prescription label at the same time every day. Chew or crush it completely before swallowing. Do not swallow tablets whole. You can take it with or without food. If it upsets your stomach, take it with food. Take all of this drug unless your health care provider tells you to stop it early. Keep taking it even if you think you are better. Talk to your health care provider about the use of this drug in children. While it may be prescribed for selected conditions, precautions do apply. Overdosage: If you think you have taken too much of this medicine contact a poison control center or emergency room at once. NOTE:  This medicine is only for you. Do not share this medicine with others. What if I miss a dose? If you miss a dose, take it as soon as you can. If it is almost time for your next dose, take only that dose. Do not take double or extra doses. What may interact with this medicine?  allopurinol  anticoagulants  birth control pills  methotrexate  probenecid This list may not describe all possible interactions. Give your health care provider a list of all the medicines, herbs, non-prescription drugs, or dietary supplements you use. Also tell them if you smoke, drink alcohol, or use illegal drugs. Some items may interact with your medicine. What should I watch for while using this medicine? Tell your doctor or healthcare provider if your symptoms do not improve. This medicine may cause serious skin reactions. They can happen weeks to months after starting the medicine. Contact your healthcare provider right away if you notice  fevers or flu-like symptoms with a rash. The rash may be red or purple and then turn into blisters or peeling of the skin. Or, you might notice a red rash with swelling of the face, lips or lymph nodes in your neck or under your arms. Do not treat diarrhea with over the counter products. Contact your doctor if you have diarrhea that lasts more than 2 days or if it is severe and watery. If you have diabetes, you may get a false-positive result for sugar in your urine. Check with your doctor or healthcare provider. Birth control pills may not work properly while you are taking this medicine. Talk to your doctor about using an extra method of birth control. What side effects may I notice from receiving this medicine? Side effects that you should report to your doctor or health care professional as soon as possible:  allergic reactions like skin rash, itching or hives, swelling of the face, lips, or tongue  breathing problems  dark urine  fever or chills, sore  throat  redness, blistering, peeling, or loosening of the skin, including inside the mouth  seizures  trouble passing urine or change in the amount of urine  unusual bleeding, bruising  unusually weak or tired  white patches or sores in the mouth or throat Side effects that usually do not require medical attention (report to your doctor or health care professional if they continue or are bothersome):  diarrhea  dizziness  headache  nausea, vomiting  stomach upset  vaginal or anal irritation This list may not describe all possible side effects. Call your doctor for medical advice about side effects. You may report side effects to FDA at 1-800-FDA-1088. Where should I keep my medicine? Keep out of the reach of children and pets. Store at room temperature between 20 and 25 degrees C (68 and 77 degrees F). Throw away any unused drug after the expiration date. NOTE: This sheet is a summary. It may not cover all possible information. If you have questions about this medicine, talk to your doctor, pharmacist, or health care provider.  2020 [employer] Standard (2019-02-28 08:58:41)   Empagliflozin oral tablets What is this medicine? EMPAGLIFLOZIN (EM pa gli FLOE zin) helps to treat type 2 diabetes. It helps to control blood sugar. This drug may also reduce the risk of heart attack or stroke if you have type 2 diabetes and risk factors for heart disease. Treatment is combined with diet and exercise. This medicine may be used for other purposes; ask your health care provider or pharmacist if you have questions. COMMON BRAND NAME(S): Jardiance What should I tell my health care provider before I take this medicine? They need to know if you have any of these conditions:  dehydration  diabetic ketoacidosis  diet low in salt  eating less due to illness, surgery, dieting, or any other reason  having surgery  high cholesterol  high levels of potassium in the blood  history  of pancreatitis or pancreas problems  history of yeast infection of the penis or vagina  if you often drink alcohol  infections in the bladder, kidneys, or urinary tract  kidney disease  liver disease  low blood pressure  on hemodialysis  problems urinating  type 1 diabetes  uncircumcised male  an unusual or allergic reaction to empagliflozin, other medicines, foods, dyes, or preservatives  pregnant or trying to get pregnant  breast-feeding How should I use this medicine? Take this medicine by mouth with a glass of  water. Follow the directions on the prescription label. Take it in the morning, with or without food. Take your dose at the same time each day. Do not take more often than directed. Do not stop taking except on your doctor's advice. Talk to your pediatrician regarding the use of this medicine in children. Special care may be needed. Overdosage: If you think you have taken too much of this medicine contact a poison control center or emergency room at once. NOTE: This medicine is only for you. Do not share this medicine with others. What if I miss a dose? If you miss a dose, take it as soon as you can. If it is almost time for your next dose, take only that dose. Do not take double or extra doses. What may interact with this medicine? Do not take this medicine with any of the following medications:  gatifloxacin This medicine may also interact with the following medications:  alcohol  certain medicines for blood pressure, heart disease  diuretics This list may not describe all possible interactions. Give your health care provider a list of all the medicines, herbs, non-prescription drugs, or dietary supplements you use. Also tell them if you smoke, drink alcohol, or use illegal drugs. Some items may interact with your medicine. What should I watch for while using this medicine? Visit your doctor or health care professional for regular checks on your  progress. This medicine can cause a serious condition in which there is too much acid in the blood. If you develop nausea, vomiting, stomach pain, unusual tiredness, or breathing problems, stop taking this medicine and call your doctor right away. If possible, use a ketone dipstick to check for ketones in your urine. A test called the HbA1C (A1C) will be monitored. This is a simple blood test. It measures your blood sugar control over the last 2 to 3 months. You will receive this test every 3 to 6 months. Learn how to check your blood sugar. Learn the symptoms of low and high blood sugar and how to manage them. Always carry a quick-source of sugar with you in case you have symptoms of low blood sugar. Examples include hard sugar candy or glucose tablets. Make sure others know that you can choke if you eat or drink when you develop serious symptoms of low blood sugar, such as seizures or unconsciousness. They must get medical help at once. Tell your doctor or health care professional if you have high blood sugar. You might need to change the dose of your medicine. If you are sick or exercising more than usual, you might need to change the dose of your medicine. Do not skip meals. Ask your doctor or health care professional if you should avoid alcohol. Many nonprescription cough and cold products contain sugar or alcohol. These can affect blood sugar. Wear a medical ID bracelet or chain, and carry a card that describes your disease and details of your medicine and dosage times. What side effects may I notice from receiving this medicine? Side effects that you should report to your doctor or health care professional as soon as possible:  allergic reactions like skin rash, itching or hives, swelling of the face, lips, or tongue  breathing problems  dizziness  feeling faint or lightheaded, falls  muscle weakness  nausea, vomiting, unusual stomach upset or pain  penile discharge, itching, or pain in  men  signs and symptoms of a genital infection, such as fever; tenderness, redness, or swelling in the genitals  or area from the genitals to the back of the rectum  signs and symptoms of low blood sugar such as feeling anxious, confusion, dizziness, increased hunger, unusually weak or tired, sweating, shakiness, cold, irritable, headache, blurred vision, fast heartbeat, loss of consciousness  signs and symptoms of a urinary tract infection, such as fever, chills, a burning feeling when urinating, blood in the urine, back pain  trouble passing urine or change in the amount of urine, including an urgent need to urinate more often, in larger amounts, or at night  unusual tiredness  vaginal discharge, itching, or odor in women Side effects that usually do not require medical attention (report to your doctor or health care professional if they continue or are bothersome):  mild increase in urination  thirsty This list may not describe all possible side effects. Call your doctor for medical advice about side effects. You may report side effects to FDA at 1-800-FDA-1088. Where should I keep my medicine? Keep out of the reach of children. Store at room temperature between 20 and 25 degrees C (68 and 77 degrees F). Throw away any unused medicine after the expiration date. NOTE: This sheet is a summary. It may not cover all possible information. If you have questions about this medicine, talk to your doctor, pharmacist, or health care provider.  2020 [employer]/Gold Standard (2017-02-26 10:25:34)

## 2019-08-08 NOTE — Progress Notes (Signed)
[facility]   Established Patient Office Visit  Subjective:  Patient ID: Tony Long, male    DOB: 1976-01-13  Age: 44 y.o. MRN: [id number]  CC:  Chief Complaint  Patient presents with  . Follow-up    DM    HPI Tony Long is a 44 year old male who presents for Follow Up today.   Past Medical History:  Diagnosis Date  . Diabetes mellitus without complication (HCC)    Current Status: Since his last office visit, he has c/o left foot wounds. [facility]. He has had 3 episodes of 'fainting' spells lately. He has seen low range of 120 and high of 370 since his last office visit. He denies fatigue, frequent urination, blurred vision, excessive hunger, excessive thirst, weight gain, and weight loss. He continues to check his feet regularly.  He denies visual changes, chest pain, cough, shortness of breath, heart palpitations, and falls. He has occasional headaches and dizziness with position changes. Denies severe headaches, confusion, seizures, double vision, and blurred vision, nausea and vomiting. He denies fevers, chills, recent infections, weight loss, and night sweats.  No reports of GI problems such as diarrhea, and constipation. He has no reports of blood in stools, dysuria and hematuria. No depression or anxiety reported. He denies suicidal ideations, homicidal ideations, or auditory hallucinations.   Past Surgical History:  Procedure Laterality Date  . INCISION AND DRAINAGE ABSCESS N/A 03/22/2018   Procedure: INCISION AND DRAINAGE ABSCESS;  Surgeon: Violeta Gelinas, MD;  Location: [facility] OR;  Service: General;  Laterality: N/A;  . TEE WITHOUT CARDIOVERSION  03/22/2018   Procedure: TRANSESOPHAGEAL ECHOCARDIOGRAM (TEE);  Surgeon: Radiologist, Medication, MD;  Location: [facility] OR;  Service: Open Heart Surgery;;    Family History  Problem Relation Age of Onset  . Heart disease  Father   . Diabetes Father   . Diabetes Paternal Uncle     Social History   Socioeconomic History  . Marital status: Single    Spouse name: Not on file  . Number of children: Not on file  . Years of education: Not on file  . Highest education level: Not on file  Occupational History  . Not on file  Tobacco Use  . Smoking status: Current Every Day Smoker    Packs/day: 1.00  . Smokeless tobacco: Never Used  Substance and Sexual Activity  . Alcohol use: Not Currently    Comment: seldom  . Drug use: No    Comment: pt mother said hx of use  . Sexual activity: Yes    Partners: Female  Other Topics Concern  . Not on file  Social History Narrative  . Not on file   Social Determinants of Health   Financial Resource Strain:   . Difficulty of Paying Living Expenses: Not on file  Food Insecurity:   . Worried About Programme researcher, broadcasting/film/video in the Last Year: Not on file  . Ran Out of Food in the Last Year: Not on file  Transportation Needs:   . Lack of Transportation (Medical): Not on file  . Lack of Transportation (Non-Medical): Not on file  Physical Activity:   . Days of Exercise per Week: Not on file  . Minutes of Exercise per Session: Not on file  Stress:   . Feeling of Stress : Not on file  Social Connections:   . Frequency of Communication with Friends and Family:  Not on file  . Frequency of Social Gatherings with Friends and Family: Not on file  . Attends Religious Services: Not on file  . Active Member of Clubs or Organizations: Not on file  . Attends BankerClub or Organization Meetings: Not on file  . Marital Status: Not on file  Intimate Partner Violence:   . Fear of Current or Ex-Partner: Not on file  . Emotionally Abused: Not on file  . Physically Abused: Not on file  . Sexually Abused: Not on file    Outpatient Medications Prior to Visit  Medication Sig Dispense Refill  . glucose blood test strip Use as instructed 100 each 12  . Lancets (ONETOUCH ULTRASOFT) lancets  Use as instructed 100 each 12  . sildenafil (VIAGRA) 25 MG tablet Take 1 tablet (25 mg total) by mouth daily as needed for erectile dysfunction. 90 tablet 3  . gabapentin (NEURONTIN) 300 MG capsule Take 2 capsule (600 mg= total) by mouth, 2 times a day. 120 capsule 3  . Insulin Glargine (LANTUS) 100 UNIT/ML Solostar Pen Inject 50 Units into the skin daily at 10 pm. (Patient taking differently: Inject 40 Units into the skin at bedtime. ) 45 mL 3  . insulin lispro (HUMALOG) 100 UNIT/ML injection Inject 0.1 mLs (10 Units total) into the skin 3 (three) times daily with meals. 60 mL 1  . cyclobenzaprine (FLEXERIL) 5 MG tablet Take 1-2 tablets (5-10 mg total) by mouth 3 (three) times daily as needed for muscle spasms. (Patient not taking: Reported on 08/08/2019) 20 tablet 0  . ibuprofen (ADVIL,MOTRIN) 600 MG tablet Take 1 tablet (600 mg total) by mouth every 8 (eight) hours as needed. (Patient not taking: Reported on 03/28/2019) 30 tablet 0  . etodolac (LODINE) 500 MG tablet Take 1 tablet (500 mg total) by mouth 2 (two) times daily. 30 tablet 0  . LANTUS 100 UNIT/ML injection INJECT 40 UNITS TOTAL INTO THE SKIN AT BEDTIME. 10 mL 0  . lisinopril (PRINIVIL,ZESTRIL) 5 MG tablet Take 1 tablet (5 mg total) by mouth daily. (Patient not taking: Reported on 08/08/2019) 90 tablet 3  . metFORMIN (GLUCOPHAGE) 500 MG tablet Take 1 tablet (500 mg total) by mouth 2 (two) times daily with a meal. (Patient not taking: Reported on 03/28/2019) 180 tablet 3  . metoprolol tartrate (LOPRESSOR) 50 MG tablet Take 1 tablet (50 mg total) by mouth 2 (two) times daily. (Patient not taking: Reported on 03/28/2019) 60 tablet 0  . potassium chloride 20 MEQ TBCR Take 20 mEq by mouth daily. (Patient not taking: Reported on 03/28/2019) 30 tablet 0  . potassium chloride SA (K-DUR,KLOR-CON) 20 MEQ tablet Take 1 tablet by mouth daily.  0  . pravastatin (PRAVACHOL) 40 MG tablet Take 1 tablet (40 mg total) by mouth daily. (Patient not taking: Reported  on 03/28/2019) 90 tablet 3  . tamsulosin (FLOMAX) 0.4 MG CAPS capsule Take 1 capsule (0.4 mg total) by mouth daily after supper. (Patient not taking: Reported on 03/28/2019) 30 capsule 0   No facility-administered medications prior to visit.    No Known Allergies  ROS Review of Systems  Constitutional: Negative.   HENT: Negative.   Eyes: Negative.   Respiratory: Negative.   Cardiovascular: Negative.   Gastrointestinal: Negative.   Endocrine: Negative.   Genitourinary: Negative.   Musculoskeletal: Negative.   Skin: Negative.   Allergic/Immunologic: Negative.   Neurological: Positive for dizziness (occasional ), weakness, light-headedness and headaches (occasional ).  Hematological: Negative.   Psychiatric/Behavioral: Negative.    Objective:  Physical Exam  Constitutional: He is oriented to person, place, and time. He appears well-developed and well-nourished.  HENT:  Head: Normocephalic and atraumatic.  Eyes: Conjunctivae are normal.  Cardiovascular: Normal rate, normal heart sounds and intact distal pulses.  Pulmonary/Chest: Effort normal and breath sounds normal.  Abdominal: Soft. Bowel sounds are normal.  Musculoskeletal:        General: Normal range of motion.     Cervical back: Normal range of motion and neck supple.  Neurological: He is alert and oriented to person, place, and time. He has normal reflexes.  Skin: Skin is warm and dry.  Psychiatric: He has a normal mood and affect. His behavior is normal. Judgment and thought content normal.  Nursing note and vitals reviewed.   BP (!) 148/91   Pulse 86   Temp 98.7 F (37.1 C) (Oral)   Ht 5\' 11"  (1.803 m)   Wt 155 lb (70.3 kg)   SpO2 96%   BMI 21.62 kg/m  Wt Readings from Last 3 Encounters:  08/08/19 155 lb (70.3 kg)  03/28/19 157 lb 6.4 oz (71.4 kg)  09/07/18 146 lb (66.2 kg)     Health Maintenance Due  Topic Date Due  . PNEUMOCOCCAL POLYSACCHARIDE VACCINE AGE 62-64 HIGH RISK  06/06/1978  .  OPHTHALMOLOGY EXAM  06/06/1986  . INFLUENZA VACCINE  01/29/2019    There are no preventive care reminders to display for this patient.  Lab Results  Component Value Date   TSH 3.62 12/11/2016   Lab Results  Component Value Date   WBC 7.2 04/17/2018   HGB 8.1 (L) 04/17/2018   HCT 28.3 (L) 04/17/2018   MCV 96.9 04/17/2018   PLT 397 04/17/2018   Lab Results  Component Value Date   NA 136 04/17/2018   K 4.0 04/17/2018   CO2 28 04/17/2018   GLUCOSE 351 (H) 04/17/2018   BUN 6 04/17/2018   CREATININE 0.76 04/17/2018   BILITOT 0.7 04/02/2018   ALKPHOS 140 (H) 04/02/2018   AST 22 04/02/2018   ALT 14 04/02/2018   PROT 6.5 04/02/2018   ALBUMIN 1.5 (L) 04/02/2018   CALCIUM 8.2 (L) 04/17/2018   ANIONGAP 8 04/17/2018   Lab Results  Component Value Date   CHOL 74 03/31/2018   Lab Results  Component Value Date   HDL 23 (L) 03/31/2018   Lab Results  Component Value Date   LDLCALC 41 03/31/2018   Lab Results  Component Value Date   TRIG 48 03/31/2018   Lab Results  Component Value Date   CHOLHDL 3.2 03/31/2018   Lab Results  Component Value Date   HGBA1C 10.2 (A) 08/08/2019      Assessment & Plan:   1. Type 2 diabetes mellitus without complication, with long-term current use of insulin (HCC) He will continue medication as prescribed, to decrease foods/beverages high in sugars and carbs and follow Heart Healthy or DASH diet. Increase physical activity to at least 30 minutes cardio exercise daily.  - CBC with Differential - Comprehensive metabolic panel - Lipid Panel - TSH - Vitamin B12 - Vitamin D, 25-hydroxy - POCT urinalysis dipstick - POCT glucose (manual entry) - POCT glycosylated hemoglobin (Hb A1C) - empagliflozin (JARDIANCE) 10 MG TABS tablet; Take 10 mg by mouth daily before breakfast.  Dispense: 30 tablet; Refill: 3 - Insulin Glargine (LANTUS) 100 UNIT/ML Solostar Pen; Inject 50 Units into the skin daily at 10 pm.  Dispense: 45 mL; Refill: 11 -  insulin lispro (HUMALOG) 100 UNIT/ML injection; Inject  0.1 mLs (10 Units total) into the skin 3 (three) times daily with meals.  Dispense: 60 mL; Refill: 11  2. Type 2 diabetes mellitus with diabetic neuropathy, with long-term current use of insulin (HCC) - pravastatin (PRAVACHOL) 40 MG tablet; Take 1 tablet (40 mg total) by mouth daily.  Dispense: 90 tablet; Refill: 3  3. Hemoglobin A1C greater than 9%, indicating poor diabetic control Improved. Hgb A1c decreased at 10.2, from 11.5 on 03/07/2020. Monitor.  - CBC with Differential - Comprehensive metabolic panel - Lipid Panel - TSH - Vitamin B12 - Vitamin D, 25-hydroxy  4. Hyperglycemia - lisinopril (ZESTRIL) 5 MG tablet; Take 1 tablet (5 mg total) by mouth daily.  Dispense: 90 tablet; Refill: 3  5. Hypertension, unspecified type Blood pressure is stable today. He will continue to take medications as prescribed, to decrease high sodium intake, excessive alcohol intake, increase potassium intake, smoking cessation, and increase physical activity of at least 30 minutes of cardio activity daily. He will continue to follow Heart Healthy or DASH diet. - CBC with Differential - Comprehensive metabolic panel - Lipid Panel - TSH - Vitamin B12 - Vitamin D, 25-hydroxy - metoprolol tartrate (LOPRESSOR) 50 MG tablet; Take 1 tablet (50 mg total) by mouth 2 (two) times daily.  Dispense: 60 tablet; Refill: 6 - pravastatin (PRAVACHOL) 40 MG tablet; Take 1 tablet (40 mg total) by mouth daily.  Dispense: 90 tablet; Refill: 3  6. History of delayed wound healing  7. Wound of left foot     [facility]   8. Wound of right foot Right foot wound has healed.  - amoxicillin-clavulanate (AUGMENTIN) 875-125 MG tablet; Take 1 tablet by mouth 2 (two) times daily for 10 days.  Dispense: 20 tablet; Refill: 0  9. IV drug user Unable to draw labs today, because of bad veins.he will hydrate well and return to office in a few days  for lab draw.   10. Neuropathy - gabapentin (NEURONTIN) 300 MG capsule; Take 2 capsule (600 mg= total) by mouth, 2 times a day.  Dispense: 120 capsule; Refill: 6  11. Abnormal urinalysis - Urine Culture  12. Follow up He will follow up in 3 months.   Meds ordered this encounter  Medications  . empagliflozin (JARDIANCE) 10 MG TABS tablet    Sig: Take 10 mg by mouth daily before breakfast.    Dispense:  30 tablet    Refill:  3  . amoxicillin-clavulanate (AUGMENTIN) 875-125 MG tablet    Sig: Take 1 tablet by mouth 2 (two) times daily for 10 days.    Dispense:  20 tablet    Refill:  0  . gabapentin (NEURONTIN) 300 MG capsule    Sig: Take 2 capsule (600 mg= total) by mouth, 2 times a day.    Dispense:  120 capsule    Refill:  6  . Insulin Glargine (LANTUS) 100 UNIT/ML Solostar Pen    Sig: Inject 50 Units into the skin daily at 10 pm.    Dispense:  45 mL    Refill:  11  . insulin lispro (HUMALOG) 100 UNIT/ML injection    Sig: Inject 0.1 mLs (10 Units total) into the skin 3 (three) times daily with meals.    Dispense:  60 mL    Refill:  11  . lisinopril (ZESTRIL) 5 MG tablet    Sig: Take 1 tablet (5 mg total) by mouth daily.    Dispense:  90 tablet  Refill:  3  . metoprolol tartrate (LOPRESSOR) 50 MG tablet    Sig: Take 1 tablet (50 mg total) by mouth 2 (two) times daily.    Dispense:  60 tablet    Refill:  6  . pravastatin (PRAVACHOL) 40 MG tablet    Sig: Take 1 tablet (40 mg total) by mouth daily.    Dispense:  90 tablet    Refill:  3    Orders Placed This Encounter  Procedures  . Urine Culture  . CBC with Differential  . Comprehensive metabolic panel  . Lipid Panel  . TSH  . Vitamin B12  . Vitamin D, 25-hydroxy  . POCT urinalysis dipstick  . POCT glucose (manual entry)  . POCT glycosylated hemoglobin (Hb A1C)    Referral Orders  No referral(s) requested today   Raliegh Ip,  MSN, FNP-BC [facility] [address] [place], [place] [address] [phone] fax   Problem List Items Addressed This Visit      Cardiovascular and Mediastinum   Hypertension   Relevant Medications   lisinopril (ZESTRIL) 5 MG tablet   metoprolol tartrate (LOPRESSOR) 50 MG tablet   pravastatin (PRAVACHOL) 40 MG tablet   Other Relevant Orders   CBC with Differential   Comprehensive metabolic panel   Lipid Panel   TSH   Vitamin B12   Vitamin D, 25-hydroxy     Endocrine   Diabetes (HCC) - Primary   Relevant Medications   empagliflozin (JARDIANCE) 10 MG TABS tablet   Insulin Glargine (LANTUS) 100 UNIT/ML Solostar Pen   insulin lispro (HUMALOG) 100 UNIT/ML injection   lisinopril (ZESTRIL) 5 MG tablet   pravastatin (PRAVACHOL) 40 MG tablet   Other Relevant Orders   CBC with Differential   Comprehensive metabolic panel   Lipid Panel   TSH   Vitamin B12   Vitamin D, 25-hydroxy   POCT urinalysis dipstick (Completed)   POCT glucose (manual entry) (Completed)   POCT glycosylated hemoglobin (Hb A1C) (Completed)     Nervous and Auditory   Neuropathy   Relevant Medications   gabapentin (NEURONTIN) 300 MG capsule     Other   Wound of right foot   Relevant Medications   amoxicillin-clavulanate (AUGMENTIN) 875-125 MG tablet    Other Visit Diagnoses    Hemoglobin A1C greater than 9%, indicating poor diabetic control       Relevant Medications   empagliflozin (JARDIANCE) 10 MG TABS tablet   Insulin Glargine (LANTUS) 100 UNIT/ML Solostar Pen   insulin lispro (HUMALOG) 100 UNIT/ML injection   lisinopril (ZESTRIL) 5 MG tablet   pravastatin (PRAVACHOL) 40 MG tablet   Other Relevant Orders   CBC with Differential   Comprehensive metabolic panel   Lipid Panel   TSH   Vitamin B12   Vitamin D, 25-hydroxy   History of delayed wound healing       Wound of left foot       Abnormal urinalysis       Relevant Orders   Urine Culture   Follow up          Meds ordered  this encounter  Medications  . empagliflozin (JARDIANCE) 10 MG TABS tablet    Sig: Take 10 mg by mouth daily before breakfast.    Dispense:  30 tablet    Refill:  3  . amoxicillin-clavulanate (AUGMENTIN) 875-125 MG tablet    Sig: Take 1 tablet by mouth 2 (two) times  daily for 10 days.    Dispense:  20 tablet    Refill:  0  . gabapentin (NEURONTIN) 300 MG capsule    Sig: Take 2 capsule (600 mg= total) by mouth, 2 times a day.    Dispense:  120 capsule    Refill:  6  . Insulin Glargine (LANTUS) 100 UNIT/ML Solostar Pen    Sig: Inject 50 Units into the skin daily at 10 pm.    Dispense:  45 mL    Refill:  11  . insulin lispro (HUMALOG) 100 UNIT/ML injection    Sig: Inject 0.1 mLs (10 Units total) into the skin 3 (three) times daily with meals.    Dispense:  60 mL    Refill:  11  . lisinopril (ZESTRIL) 5 MG tablet    Sig: Take 1 tablet (5 mg total) by mouth daily.    Dispense:  90 tablet    Refill:  3  . metoprolol tartrate (LOPRESSOR) 50 MG tablet    Sig: Take 1 tablet (50 mg total) by mouth 2 (two) times daily.    Dispense:  60 tablet    Refill:  6  . pravastatin (PRAVACHOL) 40 MG tablet    Sig: Take 1 tablet (40 mg total) by mouth daily.    Dispense:  90 tablet    Refill:  3    Follow-up: Return in about 3 months (around 11/05/2019).    Azzie Glatter, FNP

## 2019-08-09 DIAGNOSIS — R7309 Other abnormal glucose: Secondary | ICD-10-CM | POA: Insufficient documentation

## 2019-08-09 DIAGNOSIS — F1990 Other psychoactive substance use, unspecified, uncomplicated: Secondary | ICD-10-CM | POA: Insufficient documentation

## 2019-08-09 DIAGNOSIS — Z87898 Personal history of other specified conditions: Secondary | ICD-10-CM | POA: Insufficient documentation

## 2019-08-09 LAB — OXYCODONE/OXYMORPHONE, URINE: Oxycodone+Oxymorphone Ur Ql Scn: NEGATIVE ng/mL

## 2019-08-10 ENCOUNTER — Telehealth: Payer: Self-pay | Admitting: Family Medicine

## 2019-08-10 LAB — URINE CULTURE

## 2019-08-11 NOTE — Telephone Encounter (Signed)
Sent to [facility] 

## 2019-08-12 ENCOUNTER — Telehealth: Payer: Self-pay

## 2019-08-12 ENCOUNTER — Ambulatory Visit: Payer: Medicaid Other | Admitting: Physician Assistant

## 2019-08-12 NOTE — Telephone Encounter (Signed)
A follow up call place for follow up Rx option. Because patient does not need to be without insulin.

## 2019-08-12 NOTE — Telephone Encounter (Signed)
I had time to call the pharmacy today. Lantus & Humalog both are $3.00. Per pharmacy Humalog is ready for pick up.  Patient informed.

## 2019-08-15 ENCOUNTER — Other Ambulatory Visit: Payer: Self-pay

## 2019-08-15 ENCOUNTER — Encounter: Payer: Medicaid Other | Attending: Physician Assistant | Admitting: Physician Assistant

## 2019-08-15 DIAGNOSIS — F1120 Opioid dependence, uncomplicated: Secondary | ICD-10-CM | POA: Insufficient documentation

## 2019-08-15 DIAGNOSIS — Z809 Family history of malignant neoplasm, unspecified: Secondary | ICD-10-CM | POA: Insufficient documentation

## 2019-08-15 DIAGNOSIS — I10 Essential (primary) hypertension: Secondary | ICD-10-CM | POA: Diagnosis not present

## 2019-08-15 DIAGNOSIS — Z8249 Family history of ischemic heart disease and other diseases of the circulatory system: Secondary | ICD-10-CM | POA: Diagnosis not present

## 2019-08-15 DIAGNOSIS — E11622 Type 2 diabetes mellitus with other skin ulcer: Secondary | ICD-10-CM | POA: Insufficient documentation

## 2019-08-15 DIAGNOSIS — S51801A Unspecified open wound of right forearm, initial encounter: Secondary | ICD-10-CM | POA: Insufficient documentation

## 2019-08-15 DIAGNOSIS — E1151 Type 2 diabetes mellitus with diabetic peripheral angiopathy without gangrene: Secondary | ICD-10-CM | POA: Insufficient documentation

## 2019-08-15 DIAGNOSIS — B182 Chronic viral hepatitis C: Secondary | ICD-10-CM | POA: Diagnosis not present

## 2019-08-15 DIAGNOSIS — Z833 Family history of diabetes mellitus: Secondary | ICD-10-CM | POA: Diagnosis not present

## 2019-08-15 DIAGNOSIS — L97822 Non-pressure chronic ulcer of other part of left lower leg with fat layer exposed: Secondary | ICD-10-CM | POA: Diagnosis not present

## 2019-08-15 DIAGNOSIS — F17200 Nicotine dependence, unspecified, uncomplicated: Secondary | ICD-10-CM | POA: Insufficient documentation

## 2019-08-15 DIAGNOSIS — I252 Old myocardial infarction: Secondary | ICD-10-CM | POA: Diagnosis not present

## 2019-08-15 DIAGNOSIS — L98492 Non-pressure chronic ulcer of skin of other sites with fat layer exposed: Secondary | ICD-10-CM | POA: Diagnosis not present

## 2019-08-15 NOTE — Progress Notes (Signed)
Tony Long, Tony Long ([id number]) Visit Report for 08/15/2019 Allergy List Details Patient Name: Tony Long, Tony Long Date of Service: 08/15/2019 1:15 PM Medical Record Number: [id number] Patient Account Number: [id number] Date of Birth/Sex: [birth year] (44 y.o. M) Treating RN: Curtis Sites Primary Care Wateen Varon: Raliegh Ip Other Clinician: Referring Domingos Riggi: Raliegh Ip Treating Kasi Lasky/Extender: STONE III, HOYT Weeks in Treatment: 0 Allergies Active Allergies No Known Drug Allergies Allergy Notes Electronic Signature(s) Signed: 08/15/2019 4:29:13 PM By: Curtis Sites Entered By: Curtis Sites on 08/15/2019 13:29:06 Tony Long ([id number]) -------------------------------------------------------------------------------- Arrival Information Details Patient Name: Tony Long Date of Service: 08/15/2019 1:15 PM Medical Record Number: [id number] Patient Account Number: [id number] Date of Birth/Sex: [birth year] (44 y.o. M) Treating RN: Huel Coventry Primary Care Lafawn Lenoir: Raliegh Ip Other Clinician: Referring Don Giarrusso: Raliegh Ip Treating Henrietta Cieslewicz/Extender: Linwood Dibbles, HOYT Weeks in Treatment: 0 Visit Information Patient Arrived: Ambulatory Arrival Time: 13:19 Accompanied By: self Transfer Assistance: None Patient Identification Verified: Yes Secondary Verification Process Completed: Yes History Since Last Visit Added or deleted any medications: No Any new allergies or adverse reactions: No Had a fall or experienced change in activities of daily living that may affect risk of falls: No Signs or symptoms of abuse/neglect since last visito No Hospitalized since last visit: No Implantable device outside of the clinic excluding cellular tissue based products placed in the center since last visit: No Electronic Signature(s) Signed: 08/15/2019 4:18:59 PM By: Dayton Martes RCP, RRT, CHT Entered By: Dayton Martes on 08/15/2019  13:20:21 Tony Long ([id number]) -------------------------------------------------------------------------------- Clinic Level of Care Assessment Details Patient Name: Tony Long Date of Service: 08/15/2019 1:15 PM Medical Record Number: [id number] Patient Account Number: [id number] Date of Birth/Sex: 1976/01/08 (44 y.o. M) Treating RN: Rodell Perna Primary Care Kyona Chauncey: Raliegh Ip Other Clinician: Referring Liam Bossman: Raliegh Ip Treating Shalia Bartko/Extender: Linwood Dibbles, HOYT Weeks in Treatment: 0 Clinic Level of Care Assessment Items TOOL 1 Quantity Score []  - Use when EandM and Procedure is performed on INITIAL visit 0 ASSESSMENTS - Nursing Assessment / Reassessment X - General Physical Exam (combine w/ comprehensive assessment (listed just below) when 1 20 performed on new pt. evals) X- 1 25 Comprehensive Assessment (HX, ROS, Risk Assessments, Wounds Hx, etc.) ASSESSMENTS - Wound and Skin Assessment / Reassessment []  - Dermatologic / Skin Assessment (not related to wound area) 0 ASSESSMENTS - Ostomy and/or Continence Assessment and Care []  - Incontinence Assessment and Management 0 []  - 0 Ostomy Care Assessment and Management (repouching, etc.) PROCESS - Coordination of Care X - Simple Patient / Family Education for ongoing care 1 15 []  - 0 Complex (extensive) Patient / Family Education for ongoing care X- 1 10 Staff obtains Chiropractor, Records, Test Results / Process Orders []  - 0 Staff telephones HHA, Nursing Homes / Clarify orders / etc []  - 0 Routine Transfer to another Facility (non-emergent condition) []  - 0 Routine Hospital Admission (non-emergent condition) X- 1 15 New Admissions / Manufacturing engineer / Ordering NPWT, Apligraf, etc. []  - 0 Emergency Hospital Admission (emergent condition) PROCESS - Special Needs []  - Pediatric / Minor Patient Management 0 []  - 0 Isolation Patient Management []  - 0 Hearing / Language / Visual special  needs []  - 0 Assessment of Community assistance (transportation, D/C planning, etc.) []  - 0 Additional assistance / Altered mentation []  - 0 Support Surface(s) Assessment (bed, cushion, seat, etc.) Tony Long ([id number]) INTERVENTIONS - Miscellaneous []  - External ear exam 0 []  - 0 Patient Transfer (multiple staff / Nurse, adult / Similar devices) []  - 0 Simple Staple /  Suture removal (25 or less) []  - 0 Complex Staple / Suture removal (26 or more) []  - 0 Hypo/Hyperglycemic Management (do not check if billed separately) []  - 0 Ankle / Brachial Index (ABI) - do not check if billed separately Has the patient been seen at the hospital within the last three years: Yes Total Score: 85 Level Of Care: New/Established - Level 3 Electronic Signature(s) Signed: 08/15/2019 4:18:43 PM By: Entered By: on 08/15/2019 14:16:40 Rodell Perna (Rodell Perna) -------------------------------------------------------------------------------- Encounter Discharge Information Details Patient Name: 08/17/2019 Date of Service: 08/15/2019 1:15 PM Medical Record Number: [id number] Patient Account Number: Tony Long Date of Birth/Sex: 1975-11-15 (44 y.o. M) Treating RN: [id number] Primary Care Ezio Wieck: 14/01/1976 Other Clinician: Referring Nicol Herbig: 55 Treating Yeshua Stryker/Extender: Rodell Perna, HOYT Weeks in Treatment: 0 Encounter Discharge Information Items Discharge Condition: Stable Ambulatory Status: Ambulatory Discharge Destination: Home Transportation: Private Auto Accompanied By: self Schedule Follow-up Appointment: Yes Clinical Summary of Care: Electronic Signature(s) Signed: 08/15/2019 4:18:43 PM By: Raliegh Ip Entered By: Linwood Dibbles on 08/15/2019 14:17:30 Rodell Perna (Rodell Perna) -------------------------------------------------------------------------------- Lower Extremity Assessment Details Patient Name: 08/17/2019 Date  of Service: 08/15/2019 1:15 PM Medical Record Number: [id number] Patient Account Number: Tony Long Date of Birth/Sex: 1975/11/17 (44 y.o. M) Treating RN: [id number] Primary Care Dary Dilauro: 14/01/1976 Other Clinician: Referring Monserrath Junio: 55 Treating Lyniah Fujita/Extender: Curtis Sites, HOYT Weeks in Treatment: 0 Edema Assessment Assessed: [Left: No] [Right: No] Edema: [Left: N] [Right: o] Calf Left: Right: Point of Measurement: 30 cm From Medial Instep 29.5 cm cm Ankle Left: Right: Point of Measurement: 10 cm From Medial Instep 26 cm cm Vascular Assessment Pulses: Dorsalis Pedis Palpable: [Left:No] Doppler Audible: [Left:Yes] Posterior Tibial Palpable: [Left:Yes] Doppler Audible: [Left:Yes] Blood Pressure: Brachial: [Left:132] [Right:128] Dorsalis Pedis: 160 [Left:Dorsalis Pedis: 150] Ankle: Posterior Tibial: 156 [Left:Posterior Tibial: 158 1.21] [Right:1.20] Notes ABI from 01/06/2019 in office Electronic Signature(s) Signed: 08/15/2019 4:29:13 PM By: Linwood Dibbles Entered By: 03/09/2019 on 08/15/2019 13:47:05 Curtis Sites (Curtis Sites) -------------------------------------------------------------------------------- Multi Wound Chart Details Patient Name: 08/17/2019 Date of Service: 08/15/2019 1:15 PM Medical Record Number: [id number] Patient Account Number: Tony Long Date of Birth/Sex: 1976-03-09 (44 y.o. M) Treating RN: [id number] Primary Care Colisha Redler: 14/01/1976 Other Clinician: Referring Raiquan Chandler: 55 Treating Sherril Shipman/Extender: STONE III, HOYT Weeks in Treatment: 0 Vital Signs Height(in): 71 Pulse(bpm): 99 Weight(lbs): 153 Blood Pressure(mmHg): 110/73 Body Mass Index(BMI): 21 Temperature(F): 98.9 Respiratory Rate 16 (breaths/min): Photos: [N/A:N/A] Wound Location: Left Lower Leg - Medial Right Forearm N/A Wounding Event: Gradually Appeared Blister N/A Primary Etiology: Diabetic Wound/Ulcer of the To be  determined N/A Lower Extremity Comorbid History: Type II Diabetes Type II Diabetes N/A Date Acquired: 08/08/2019 08/01/2019 N/A Weeks of Treatment: 0 0 N/A Wound Status: Open Open N/A Measurements L x W x D 3x1.1x0.2 1.3x2.7x0.1 N/A (cm) Area (cm) : 2.592 2.757 N/A Volume (cm) : 0.518 0.276 N/A % Reduction in Area: 0.00% N/A N/A % Reduction in Volume: 0.00% N/A N/A Classification: Grade 1 Full Thickness Without N/A Exposed Support Structures Exudate Amount: Medium Medium N/A Exudate Type: Sanguinous Serous N/A Exudate Color: red amber N/A Wound Margin: Flat and Intact Flat and Intact N/A Granulation Amount: Small (1-33%) Large (67-100%) N/A Granulation Quality: Pink Pink N/A Necrotic Amount: Large (67-100%) Small (1-33%) N/A Necrotic Tissue: Eschar, Adherent Slough Adherent Slough N/A Exposed Structures: Fat Layer (Subcutaneous Fat Layer (Subcutaneous N/A Tissue) Exposed: Yes Tissue) Exposed: Yes Fascia: No Fascia: No Tendon: No Tendon: No Muscle: No Muscle: No Tony Long, Tony Long (10/06/2019)  Joint: No Joint: No Bone: No Bone: No Epithelialization: Small (1-33%) Small (1-33%) N/A Treatment Notes Electronic Signature(s) Signed: 08/15/2019 4:18:43 PM By: Rodell Perna Entered By: Rodell Perna on 08/15/2019 14:09:06 Tony Long ([id number]) -------------------------------------------------------------------------------- Multi-Disciplinary Care Plan Details Patient Name: Tony Long Date of Service: 08/15/2019 1:15 PM Medical Record Number: [id number] Patient Account Number: [id number] Date of Birth/Sex: 1975-12-01 (44 y.o. M) Treating RN: Rodell Perna Primary Care Jarelly Rinck: Raliegh Ip Other Clinician: Referring Byanka Landrus: Raliegh Ip Treating Lanise Mergen/Extender: Linwood Dibbles, HOYT Weeks in Treatment: 0 Active Inactive Orientation to the Wound Care Program Nursing Diagnoses: Knowledge deficit related to the wound healing center  program Goals: Patient/caregiver will verbalize understanding of the Wound Healing Center Program Date Initiated: 08/15/2019 Target Resolution Date: 09/09/2019 Goal Status: Active Interventions: Provide education on orientation to the wound center Notes: Wound/Skin Impairment Nursing Diagnoses: Impaired tissue integrity Goals: Ulcer/skin breakdown will have a volume reduction of 30% by week 4 Date Initiated: 08/15/2019 Target Resolution Date: 09/09/2019 Goal Status: Active Interventions: Assess ulceration(s) every visit Provide education on smoking Notes: Electronic Signature(s) Signed: 08/15/2019 4:18:43 PM By: Rodell Perna Entered By: Rodell Perna on 08/15/2019 14:08:49 Tony Long ([id number]) -------------------------------------------------------------------------------- Pain Assessment Details Patient Name: Tony Long Date of Service: 08/15/2019 1:15 PM Medical Record Number: [id number] Patient Account Number: [id number] Date of Birth/Sex: 05/16/1976 (44 y.o. M) Treating RN: Huel Coventry Primary Care Taiki Buckwalter: Raliegh Ip Other Clinician: Referring [place] Sloop: Raliegh Ip Treating Nadege Carriger/Extender: Linwood Dibbles, HOYT Weeks in Treatment: 0 Active Problems Location of Pain Severity and Description of Pain Patient Has Paino No Site Locations Pain Management and Medication Current Pain Management: Electronic Signature(s) Signed: 08/15/2019 4:18:59 PM By: Dayton Martes RCP, RRT, CHT Signed: 08/15/2019 5:08:08 PM By: Elliot Gurney, BSN, RN, CWS, Kim RN, BSN Entered By: Dayton Martes on 08/15/2019 13:20:28 Tony Long ([id number]) -------------------------------------------------------------------------------- Patient/Caregiver Education Details Patient Name: Tony Long Date of Service: 08/15/2019 1:15 PM Medical Record Number: [id number] Patient Account Number: [id number] Date of Birth/Gender: 08/05/1975 (44 y.o. M) Treating RN:  Rodell Perna Primary Care Physician: Raliegh Ip Other Clinician: Referring Physician: Raliegh Ip Treating Physician/Extender: Skeet Simmer in Treatment: 0 Education Assessment Education Provided To: Patient Education Topics Provided Wound/Skin Impairment: Handouts: Caring for Your Ulcer Methods: Demonstration, Explain/Verbal Responses: State content correctly Electronic Signature(s) Signed: 08/15/2019 4:18:43 PM By: Rodell Perna Entered By: Rodell Perna on 08/15/2019 14:16:54 Tony Long ([id number]) -------------------------------------------------------------------------------- Wound Assessment Details Patient Name: Tony Long Date of Service: 08/15/2019 1:15 PM Medical Record Number: [id number] Patient Account Number: [id number] Date of Birth/Sex: 07-03-1975 (44 y.o. M) Treating RN: Curtis Sites Primary Care Trevon Strothers: Raliegh Ip Other Clinician: Referring Takela Varden: Raliegh Ip Treating Jamielynn Wigley/Extender: STONE III, HOYT Weeks in Treatment: 0 Wound Status Wound Number: 12 Primary Etiology: Diabetic Wound/Ulcer of the Lower Extremity Wound Location: Left Lower Leg - Medial Wound Status: Open Wounding Event: Gradually Appeared Comorbid Type II Diabetes Date Acquired: 08/08/2019 History: Weeks Of Treatment: 0 Clustered Wound: No Photos Wound Measurements Length: (cm) 3 % Reduction i Width: (cm) 1.1 % Reduction i Depth: (cm) 0.2 Epithelializa Area: (cm) 2.592 Tunneling: Volume: (cm) 0.518 Undermining: n Area: 0% n Volume: 0% tion: Small (1-33%) No No Wound Description Classification: Grade 1 Foul Odor Aft Wound Margin: Flat and Intact Slough/Fibrin Exudate Amount: Medium Exudate Type: Sanguinous Exudate Color: red er Cleansing: No o Yes Wound Bed Granulation Amount: Small (1-33%) Exposed Structure Granulation Quality: Pink Fascia Exposed: No Necrotic Amount: Large (67-100%) Fat Layer (Subcutaneous Tissue) Exposed:  Yes Necrotic Quality: Eschar, Adherent Slough Tendon Exposed:  No Muscle Exposed: No Joint Exposed: No Bone Exposed: No Treatment Notes Tony Long, Tony Long ([id number]) Wound #12 (Left, Medial Lower Leg) Notes prisma, telfa [employer]) Signed: 08/15/2019 4:29:13 PM By: Montey Hora Entered By: Montey Hora on 08/15/2019 13:44:27 Tony Long ([id number]) -------------------------------------------------------------------------------- Wound Assessment Details Patient Name: Tony Long Date of Service: 08/15/2019 1:15 PM Medical Record Number: [id number] Patient Account Number: [id number] Date of Birth/Sex: 25-Dec-1975 (44 y.o. M) Treating RN: Montey Hora Primary Care Bridgitte Felicetti: Kathe Becton Other Clinician: Referring Mora Pedraza: Kathe Becton Treating Lowen Mansouri/Extender: STONE III, HOYT Weeks in Treatment: 0 Wound Status Wound Number: 13 Primary Etiology: To be determined Wound Location: Right Forearm Wound Status: Open Wounding Event: Blister Comorbid History: Type II Diabetes Date Acquired: 08/01/2019 Weeks Of Treatment: 0 Clustered Wound: No Photos Wound Measurements Length: (cm) 1.3 Width: (cm) 2.7 Depth: (cm) 0.1 Area: (cm) 2.757 Volume: (cm) 0.276 % Reduction in Area: % Reduction in Volume: Epithelialization: Small (1-33%) Tunneling: No Undermining: No Wound Description Full Thickness Without Exposed Support Foul Odor Classification: Structures Slough/Fi Wound Margin: Flat and Intact Exudate Medium Amount: Exudate Type: Serous Exudate Color: amber After Cleansing: No brino Yes Wound Bed Granulation Amount: Large (67-100%) Exposed Structure Granulation Quality: Pink Fascia Exposed: No Necrotic Amount: Small (1-33%) Fat Layer (Subcutaneous Tissue) Exposed: Yes Necrotic Quality: Adherent Slough Tendon Exposed: No Muscle Exposed: No Joint Exposed: No Bone Exposed: No ISAAK, DELMUNDO ([id number]) Treatment Notes Wound  #13 (Right Forearm) Notes prisma, telfa [employer]) Signed: 08/15/2019 4:29:13 PM By: Montey Hora Entered By: Montey Hora on 08/15/2019 13:43:57 Tony Long ([id number]) -------------------------------------------------------------------------------- Wound Assessment Details Patient Name: Tony Long Date of Service: 08/15/2019 1:15 PM Medical Record Number: [id number] Patient Account Number: [id number] Date of Birth/Sex: [birth year] (44 y.o. M) Treating RN: Army Melia Primary Care Bayler Gehrig: Kathe Becton Other Clinician: Referring Mane Consolo: Kathe Becton Treating Phoebie Shad/Extender: STONE III, HOYT Weeks in Treatment: 0 Wound Status Wound Number: 14 Primary Etiology: Diabetic Wound/Ulcer of the Lower Extremity Wound Location: Left Lower Leg - Medial, Proximal Wound Status: Open Wounding Event: Gradually Appeared Comorbid Type II Diabetes Date Acquired: 08/01/2019 History: Weeks Of Treatment: 0 Clustered Wound: No Photos Wound Measurements Length: (cm) 0.7 Width: (cm) 0.5 Depth: (cm) 0.2 Area: (cm) 0.275 Volume: (cm) 0.055 % Reduction in Area: % Reduction in Volume: Epithelialization: None Tunneling: No Undermining: No Wound Description Classification: Grade 2 Foul Odor Exudate Amount: Medium Slough/Fi After Cleansing: No brino Yes Wound Bed Granulation Amount: Medium (34-66%) Exposed Structure Granulation Quality: Red Fascia Exposed: No Necrotic Amount: Medium (34-66%) Fat Layer (Subcutaneous Tissue) Exposed: Yes Necrotic Quality: Adherent Slough Tendon Exposed: No Muscle Exposed: No Joint Exposed: No Bone Exposed: No Treatment Notes Wound #14 (Left, Proximal, Medial Lower Leg) Notes ARHAM, SYMMONDS ([id number]) prisma, [place] Electronic Signature(s) Signed: 08/15/2019 4:18:43 PM By: Army Melia Entered By: Army Melia on 08/15/2019 14:15:21 Tony Long  ([id number]) -------------------------------------------------------------------------------- Vitals Details Patient Name: Tony Long Date of Service: 08/15/2019 1:15 PM Medical Record Number: [id number] Patient Account Number: [id number] Date of Birth/Sex: 1976-06-07 (45 y.o. M) Treating RN: Cornell Barman Primary Care Zamariyah Furukawa: Kathe Becton Other Clinician: Referring Sherald Balbuena: Kathe Becton Treating Antonie Borjon/Extender: Melburn Hake, HOYT Weeks in Treatment: 0 Vital Signs Time Taken: 13:20 Temperature (F): 98.9 Height (in): 71 Pulse (bpm): 99 Source: Stated Respiratory Rate (breaths/min): 16 Weight (lbs): 153 Blood Pressure (mmHg): 110/73 Source: Measured Reference Range: 80 - 120 mg / dl Body Mass Index (BMI): 21.3 Electronic Signature(s) Signed: 08/15/2019 4:18:59 PM By: Becky Sax, Sallie RCP, RRT, CHT Entered By: Becky Sax,  Sallie on 08/15/2019 13:23:53

## 2019-08-15 NOTE — Progress Notes (Signed)
LARSON, LIMONES ([id number]) Visit Report for 08/15/2019 Abuse/Suicide Risk Screen Details Patient Name: Tony Long, Tony Long Date of Service: 08/15/2019 1:15 PM Medical Record Number: [id number] Patient Account Number: [id number] Date of Birth/Sex: 1976/01/17 (44 y.o. M) Treating RN: Curtis Sites Primary Care Randall Rampersad: Raliegh Ip Other Clinician: Referring Athalie Newhard: Raliegh Ip Treating Terianne Thaker/Extender: STONE III, HOYT Weeks in Treatment: 0 Abuse/Suicide Risk Screen Items Answer ABUSE RISK SCREEN: Has anyone close to you tried to hurt or harm you recentlyo No Do you feel uncomfortable with anyone in your familyo No Has anyone forced you do things that you didnot want to doo No Electronic Signature(s) Signed: 08/15/2019 4:29:13 PM By: Curtis Sites Entered By: Curtis Sites on 08/15/2019 13:31:13 Tony Long ([id number]) -------------------------------------------------------------------------------- Activities of Daily Living Details Patient Name: Tony Long Date of Service: 08/15/2019 1:15 PM Medical Record Number: [id number] Patient Account Number: [id number] Date of Birth/Sex: 1976/02/07 (44 y.o. M) Treating RN: Curtis Sites Primary Care Alazia Crocket: Raliegh Ip Other Clinician: Referring Sahory Nordling: Raliegh Ip Treating Vimal Derego/Extender: STONE III, HOYT Weeks in Treatment: 0 Activities of Daily Living Items Answer Activities of Daily Living (Please select one for each item) Drive Automobile Completely Able Take Medications Completely Able Use Telephone Completely Able Care for Appearance Completely Able Use Toilet Completely Able Bath / Shower Completely Able Dress Self Completely Able Feed Self Completely Able Walk Completely Able Get In / Out Bed Completely Able Housework Completely Able Prepare Meals Completely Able Handle Money Completely Able Shop for Self Completely Able Electronic Signature(s) Signed: 08/15/2019 4:29:13 PM By: Curtis Sites Entered By: Curtis Sites on 08/15/2019 13:31:35 Tony Long ([id number]) -------------------------------------------------------------------------------- Education Screening Details Patient Name: Tony Long Date of Service: 08/15/2019 1:15 PM Medical Record Number: [id number] Patient Account Number: [id number] Date of Birth/Sex: 05/30/1976 (44 y.o. M) Treating RN: Curtis Sites Primary Care Jerusha Reising: Raliegh Ip Other Clinician: Referring Nikala Walsworth: Raliegh Ip Treating Terrah Decoster/Extender: Linwood Dibbles, HOYT Weeks in Treatment: 0 Primary Learner Assessed: Patient Learning Preferences/Education Level/Primary Language Learning Preference: Explanation, Demonstration Highest Education Level: College or Above Preferred Language: English Cognitive Barrier Language Barrier: No Translator Needed: No Memory Deficit: No Emotional Barrier: No Cultural/Religious Beliefs Affecting Medical Care: No Physical Barrier Impaired Vision: No Impaired Hearing: No Decreased Hand dexterity: No Knowledge/Comprehension Knowledge Level: Medium Comprehension Level: Medium Ability to understand written Medium instructions: Ability to understand verbal Medium instructions: Motivation Anxiety Level: Calm Cooperation: Cooperative Education Importance: Acknowledges Need Interest in Health Problems: Asks Questions Perception: Coherent Willingness to Engage in Self- Medium Management Activities: Readiness to Engage in Self- Medium Management Activities: Electronic Signature(s) Signed: 08/15/2019 4:29:13 PM By: Curtis Sites Entered By: Curtis Sites on 08/15/2019 13:32:18 Tony Long ([id number]) -------------------------------------------------------------------------------- Fall Risk Assessment Details Patient Name: Tony Long Date of Service: 08/15/2019 1:15 PM Medical Record Number: [id number] Patient Account Number: [id number] Date of Birth/Sex: 10-30-1975  (44 y.o. M) Treating RN: Curtis Sites Primary Care Skylen Spiering: Raliegh Ip Other Clinician: Referring Linzie Boursiquot: Raliegh Ip Treating Kalman Nylen/Extender: Linwood Dibbles, HOYT Weeks in Treatment: 0 Fall Risk Assessment Items Have you had 2 or more falls in the last 12 monthso 0 No Have you had any fall that resulted in injury in the last 12 monthso 0 No FALLS RISK SCREEN History of falling - immediate or within 3 months 0 No Secondary diagnosis (Do you have 2 or more medical diagnoseso) 0 No Ambulatory aid None/bed rest/wheelchair/nurse 0 Yes Crutches/cane/walker 0 No Furniture 0 No Intravenous therapy Access/Saline/Heparin Lock 0 No Gait/Transferring Normal/ bed rest/ wheelchair 0 Yes Weak (short steps with or without shuffle, stooped  but able to lift head while 0 No walking, may seek support from furniture) Impaired (short steps with shuffle, may have difficulty arising from chair, head 0 No down, impaired balance) Mental Status Oriented to own ability 0 Yes Electronic Signature(s) Signed: 08/15/2019 4:29:13 PM By: Montey Hora Entered By: Montey Hora on 08/15/2019 13:32:26 Tony Long ([id number]) -------------------------------------------------------------------------------- Foot Assessment Details Patient Name: Tony Long Date of Service: 08/15/2019 1:15 PM Medical Record Number: [id number] Patient Account Number: [id number] Date of Birth/Sex: 12-Dec-1975 (44 y.o. M) Treating RN: Montey Hora Primary Care Shakeya Kerkman: Kathe Becton Other Clinician: Referring Kurtis Anastasia: Kathe Becton Treating Madeline Pho/Extender: Melburn Hake, HOYT Weeks in Treatment: 0 Foot Assessment Items Site Locations + = Sensation present, - = Sensation absent, C = Callus, U = Ulcer R = Redness, W = Warmth, M = Maceration, PU = Pre-ulcerative lesion F = Fissure, S = Swelling, D = Dryness Assessment Right: Left: Other Deformity: No No Prior Foot Ulcer: No No Prior Amputation: No  No Charcot Joint: No No Ambulatory Status: Ambulatory Without Help Gait: Steady Electronic Signature(s) Signed: 08/15/2019 4:29:13 PM By: Montey Hora Entered By: Montey Hora on 08/15/2019 13:34:50 Tony Long ([id number]) -------------------------------------------------------------------------------- Nutrition Risk Screening Details Patient Name: Tony Long Date of Service: 08/15/2019 1:15 PM Medical Record Number: [id number] Patient Account Number: [id number] Date of Birth/Sex: 1975-11-30 (44 y.o. M) Treating RN: Montey Hora Primary Care Jola Critzer: Kathe Becton Other Clinician: Referring Quay Simkin: Kathe Becton Treating Gean Laursen/Extender: STONE III, HOYT Weeks in Treatment: 0 Height (in): 71 Weight (lbs): 153 Body Mass Index (BMI): 21.3 Nutrition Risk Screening Items Score Screening NUTRITION RISK SCREEN: I have an illness or condition that made me change the kind and/or amount of 0 No food I eat I eat fewer than two meals per day 0 No I eat few fruits and vegetables, or milk products 0 No I have three or more drinks of beer, liquor or wine almost every day 0 No I have tooth or mouth problems that make it hard for me to eat 0 No I don't always have enough money to buy the food I need 0 No I eat alone most of the time 0 No I take three or more different prescribed or over-the-counter drugs a day 1 Yes Without wanting to, I have lost or gained 10 pounds in the last six months 0 No I am not always physically able to shop, cook and/or feed myself 0 No Nutrition Protocols Good Risk Protocol 0 No interventions needed Moderate Risk Protocol High Risk Proctocol Risk Level: Good Risk Score: 1 Electronic Signature(s) Signed: 08/15/2019 4:29:13 PM By: Montey Hora Entered By: Montey Hora on 08/15/2019 13:32:34

## 2019-08-15 NOTE — Progress Notes (Signed)
SULEYMAN, EHRMAN ([id number]) Visit Report for 08/15/2019 Chief Complaint Document Details Patient Name: Tony Long, Tony Long Date of Service: 08/15/2019 1:15 PM Medical Record Number: [id number] Patient Account Number: [id number] Date of Birth/Sex: 1976-02-29 (44 y.o. M) Treating RN: Cornell Barman Primary Care Provider: Kathe Becton Other Clinician: Referring Provider: Kathe Becton Treating Provider/Extender: Melburn Hake, Doretta Remmert Weeks in Treatment: 0 Information Obtained from: Patient Chief Complaint Right forearm and Left LE Ulcers Electronic Signature(s) Signed: 08/15/2019 2:04:26 PM By: Worthy Keeler PA-C Entered By: Worthy Keeler on 08/15/2019 14:04:25 Tony Long ([id number]) -------------------------------------------------------------------------------- Debridement Details Patient Name: Tony Long Date of Service: 08/15/2019 1:15 PM Medical Record Number: [id number] Patient Account Number: [id number] Date of Birth/Sex: Mar 16, 1976 (43 y.o. M) Treating RN: Army Melia Primary Care Provider: Kathe Becton Other Clinician: Referring Provider: Kathe Becton Treating Provider/Extender: Melburn Hake, Torian Thoennes Weeks in Treatment: 0 Debridement Performed for Wound #12 Left,Medial Lower Leg Assessment: Performed By: Physician STONE III, Marcelle Hepner E., PA-C Debridement Type: Debridement Severity of Tissue Pre Fat layer exposed Debridement: Level of Consciousness (Pre- Awake and Alert procedure): Pre-procedure Verification/Time Yes - 14:18 Out Taken: Start Time: 14:18 Pain Control: Lidocaine Total Area Debrided (L x W): 3 (cm) x 1.1 (cm) = 3.3 (cm) Tissue and other material Viable, Non-Viable, Eschar, Slough, Subcutaneous, Slough debrided: Level: Skin/Subcutaneous Tissue Debridement Description: Excisional Instrument: Curette Bleeding: Minimum Hemostasis Achieved: Pressure End Time: 14:20 Response to Treatment: Procedure was tolerated well Level of Consciousness Awake  and Alert (Post-procedure): Post Debridement Measurements of Total Wound Length: (cm) 3 Width: (cm) 1.1 Depth: (cm) 0.2 Volume: (cm) 0.518 Character of Wound/Ulcer Post Debridement: Stable Severity of Tissue Post Debridement: Fat layer exposed Post Procedure Diagnosis Same as Pre-procedure Electronic Signature(s) Signed: 08/15/2019 4:18:43 PM By: Army Melia Signed: 08/15/2019 4:43:11 PM By: Worthy Keeler PA-C Entered By: Army Melia on 08/15/2019 14:19:12 Tony Long ([id number]) -------------------------------------------------------------------------------- HPI Details Patient Name: Tony Long Date of Service: 08/15/2019 1:15 PM Medical Record Number: [id number] Patient Account Number: [id number] Date of Birth/Sex: Dec 08, 1975 (44 y.o. M) Treating RN: Cornell Barman Primary Care Provider: Kathe Becton Other Clinician: Referring Provider: Kathe Becton Treating Provider/Extender: Melburn Hake, Dorlis Judice Weeks in Treatment: 0 History of Present Illness Associated Signs and Symptoms: Patient does have a history of chronic viral hepatitis see, hypertension, and opioid dependence. HPI Description: 05/31/18 on evaluation today patient presents for initial evaluation or office for two issues that he's been having with openings in the lumbar spine extending into the lower thoracic region. Subsequently it appears that as best I can tell from his notes he likely had an epidural abscess which subsequently led to these open areas. He was seen by infectious disease, Janene Madeira, who referred him to [place] for management of the wounds. Apparently he has completed his course of IV antibiotic therapy at this point. Patient did have a technically limited examination of the cervical and thoracic spine by way of it and MRI. Unfortunately he was not able to tolerate the full length of the exam therefore only sagittal sections were obtained of the thoracic spine region. Fortunately there is no  imaging findings to suggest spinal cord infarction. There was interval improvement in the epidural collection (epidural abscess) on this examination when compared to the prior examination which was 03/22/18. The date of this MRI was 03/30/18. Again there was no imaging performed of the lumbar spine at this time. No new disc guidance or facet arthritis noted. There is no evidence of osteomyelitis. Currently the patient does not have any significant pain which  is good news. With that being said he is having some discomfort at this point mainly with packing of the wounds although I think packing is going to continue to be the treatment of choice. No fevers, chills, nausea, or vomiting noted at this time. 06/07/18 on evaluation today patient's wounds in the back region specifically the lumbar spine area appear to be doing very well at this time. In fact the 12 o'clock total of the cephalad wound shows evidence of not being quite as wide open as what I previously noted. In fact there is gonna be no chance of packing anything into this area at this time. The caudal wound actually shows signs of being about the same although again as I explained to the patient is gonna take some time for this area to heal. Readmission: 09/20/18 patient seen today for readmission concerning issues that he has been having with his back I previously saw him for this towards the end of last year 2019. Subsequently he was lost to follow-up due to not returning to the clinic. Subsequently what brings them in today is actually a different issue with his right lower quadrant abdominal area/groin where he has what appears to be an abscess at this time that for the most part is resolving but he actually has eschar covering the surface of the wound currently. With that being said he states this has been painful although it's less painful now than it's been in the past. He did attempt to use a razor blade to actually remove some of this  dark eschar prior to coming into the office today. With that being said he only did a very small portion and states he got too scared to continue. Again I believe it's best for him not to do this but rather allow [place] to do this if need be. Nonetheless I do think the escort coming off of benefit him he completely agrees. Fortunately there's no signs of active infection at this time. He does have some integration noted around the wound region. No fevers, chills, nausea, or vomiting noted at this time. Patient tells me that he currently is not utilizing any IV recreational drugs at this point he states he's doing very well in that regard. Readmission: 01/06/19 patient presents today for reevaluation in our clinic. I previously seen him four wounds on his back region in particular. Nonetheless he actually has wounds of this time in regard to his bilateral lower extremities which unfortunately have been giving him some issues for several weeks. Fortunately there's no signs of significant infection although there does seem to be some irritation. He states this will start out as red patches that become somewhat tender and then open up into ulcers. He has not gotten any answers as to what may be causing this. He does have a history of opioid abuse but tells me he has not used the recreational drugs in the past nine months. He does have hypertension in multiple lower extremity leg ulcers and varying degrees of severity and tissue exposure noted of the bilateral lower extremities. He also has diabetes. There is no history of trauma he does not know where these came from. JONTE, SHILLER ([id number]) Readmission: 08/15/2019 patient presents today for reevaluation here in the clinic concerning issues that he has been having with a wound on his left lower extremity as well as his right forearm. Fortunately neither appears to be extremely severe which is good news the left leg area is a region that he  has had  multiple times in the past that we have been helping take care of him. Again this seems to be more due to scar tissue and breakdown he does not really have significant edema but I still wonder if this could be more venous in nature potentially just due to the recurrence. Nonetheless he tells me that the forearm happened while he was working on a car that has not been running 30 years. There is nothing hot I suspect a chemical burn to this area where he might of gotten something on and that he did not realize that caused an issue here. This is very superficial. Electronic Signature(s) Signed: 08/15/2019 2:22:46 PM By: Lenda KelpStone III, Collen Vincent PA-C Entered By: Lenda KelpStone III, Staria Birkhead on 08/15/2019 14:22:46 Wynelle ClevelandKENNEDY, Banyan ([id number]) -------------------------------------------------------------------------------- Physical Exam Details Patient Name: Wynelle ClevelandKENNEDY, Shaurya Date of Service: 08/15/2019 1:15 PM Medical Record Number: [id number] Patient Account Number: [id number] Date of Birth/Sex: 01/17/1976 (44 y.o. M) Treating RN: Huel CoventryWoody, Kim Primary Care Provider: Raliegh IpSTROUD, NATALIE Other Clinician: Referring Provider: Raliegh IpSTROUD, NATALIE Treating Provider/Extender: STONE III, Talena Neira Weeks in Treatment: 0 Constitutional sitting or standing blood pressure is within target range for patient.. pulse regular and within target range for patient.Marland Kitchen. respirations regular, non-labored and within target range for patient.Marland Kitchen. temperature within target range for patient.. Well- nourished and well-hydrated in no acute distress. Eyes conjunctiva clear no eyelid edema noted. pupils equal round and reactive to light and accommodation. Ears, Nose, Mouth, and Throat no gross abnormality of ear auricles or external auditory canals. normal hearing noted during conversation. mucus membranes moist. Respiratory normal breathing without difficulty. Cardiovascular 2+ dorsalis pedis/posterior tibialis pulses. no clubbing, cyanosis, significant edema,  <3 sec cap refill. Gastrointestinal (GI) soft, non-tender, non-distended, +BS. no ventral hernia noted. Musculoskeletal normal gait and posture. no significant deformity or arthritic changes, no loss or range of motion, no clubbing. Psychiatric this patient is able to make decisions and demonstrates good insight into disease process. Alert and Oriented x 3. pleasant and cooperative. Notes Upon inspection patient's wounds on the leg there actually ended up being 2 of them appears to be doing quite well which is good news there is no signs of active infection at this time which is also good news and overall I feel like that this hopefully will heal quite readily. The patient tells me he is not taking his blood pressure medication although his blood pressure is doing well today states it seems to be doing well at home as well when he checks it he needs to probably talk to his primary care provider about this as he supposed to be on 2 medications that do not seem to be potentially necessary based on what we are seeing if he is not taking them currently. Patient's wound on the leg did require sharp debridement in regard to the larger of the 2 ulcers he tolerated that without complication today. No debridement was noted at in need any other location has been necessary. Electronic Signature(s) Signed: 08/15/2019 2:23:52 PM By: Lenda KelpStone III, Analisse Randle PA-C Entered By: Lenda KelpStone III, Jigar Zielke on 08/15/2019 14:23:52 Wynelle ClevelandKENNEDY, Deklyn ([id number]) -------------------------------------------------------------------------------- Physician Orders Details Patient Name: Wynelle ClevelandKENNEDY, Amogh Date of Service: 08/15/2019 1:15 PM Medical Record Number: [id number] Patient Account Number: [id number] Date of Birth/Sex: 02/06/1976 (44 y.o. M) Treating RN: Rodell PernaScott, Dajea Primary Care Provider: Raliegh IpSTROUD, NATALIE Other Clinician: Referring Provider: Raliegh IpSTROUD, NATALIE Treating Provider/Extender: Linwood DibblesSTONE III, Jaquelynn Wanamaker Weeks in Treatment: 0 Verbal /  Phone Orders: No Diagnosis Coding ICD-10 Coding Code Description E11.622 Type 2 diabetes mellitus with other  skin ulcer L97.822 Non-pressure chronic ulcer of other part of left lower leg with fat layer exposed S51.801A Unspecified open wound of right forearm, initial encounter F11.20 Opioid dependence, uncomplicated I10 Essential (primary) hypertension B18.2 Chronic viral hepatitis C Wound Cleansing Wound #12 Left,Medial Lower Leg o Clean wound with Normal Saline. - in office o Cleanse wound with mild soap and water Wound #13 Right Forearm o Clean wound with Normal Saline. - in office o Cleanse wound with mild soap and water Wound #14 Left,Proximal,Medial Lower Leg o Clean wound with Normal Saline. - in office o Cleanse wound with mild soap and water Primary Wound Dressing Wound #12 Left,Medial Lower Leg o Silver Collagen Wound #13 Right Forearm o Silver Collagen Wound #14 Left,Proximal,Medial Lower Leg o Silver Collagen Secondary Dressing Wound #12 Left,Medial Lower Leg o [place] Wound #13 Right Forearm o [facility] Wound #14 Left,Proximal,Medial Lower Leg [address] [place], [place] ([id number]) Dressing Change Frequency Wound #12 Left,Medial Lower Leg o Change dressing every other day. Wound #13 Right Forearm o Change dressing every other day. Wound #14 Left,Proximal,Medial Lower Leg o Change dressing every other day. Follow-up Appointments Wound #12 Left,Medial Lower Leg o Return Appointment in 1 week. Wound #13 Right Forearm o Return Appointment in 1 week. Wound #14 Left,Proximal,Medial Lower Leg o Return Appointment in 1 week. Electronic Signature(s) Signed: 08/15/2019 4:18:43 PM By: Rodell Perna Signed: 08/15/2019 4:43:11 PM By: Lenda Kelp PA-C Entered By: Rodell Perna on 08/15/2019 14:16:00 [name] ([id number]) -------------------------------------------------------------------------------- Problem  List Details Patient Name: [name] Date of Service: 08/15/2019 1:15 PM Medical Record Number: [id number] Patient Account Number: [id number] Date of Birth/Sex: 04-09-1976 (44 y.o. M) Treating RN: Huel Coventry Primary Care Provider: Raliegh Ip Other Clinician: Referring Provider: Raliegh Ip Treating Provider/Extender: Linwood Dibbles, Tarissa Kerin Weeks in Treatment: 0 Active Problems ICD-10 Evaluated Encounter Code Description Active Date Today Diagnosis E11.622 Type 2 diabetes mellitus with other skin ulcer 08/15/2019 No Yes L97.822 Non-pressure chronic ulcer of other part of left lower leg with 08/15/2019 No Yes fat layer exposed S51.801A Unspecified open wound of right forearm, initial encounter 08/15/2019 No Yes F11.20 Opioid dependence, uncomplicated 08/15/2019 No Yes I10 Essential (primary) hypertension 08/15/2019 No Yes B18.2 Chronic viral hepatitis C 08/15/2019 No Yes Inactive Problems Resolved Problems Electronic Signature(s) Signed: 08/15/2019 2:03:57 PM By: Lenda Kelp PA-C Entered By: Lenda Kelp on 08/15/2019 14:03:57 [name] ([id number]) -------------------------------------------------------------------------------- Progress Note Details Patient Name: [name] Date of Service: 08/15/2019 1:15 PM Medical Record Number: [id number] Patient Account Number: [id number] Date of Birth/Sex: 1975/09/25 (44 y.o. M) Treating RN: Huel Coventry Primary Care Provider: Raliegh Ip Other Clinician: Referring Provider: Raliegh Ip Treating Provider/Extender: Linwood Dibbles, Daimion Adamcik Weeks in Treatment: 0 Subjective Chief Complaint Information obtained from Patient Right forearm and Left LE Ulcers History of Present Illness (HPI) The following HPI elements were documented for the patient's wound: Associated Signs and Symptoms: Patient does have a history of chronic viral hepatitis see, hypertension, and opioid dependence. 05/31/18 on evaluation today patient  presents for initial evaluation or office for two issues that he's been having with openings in the lumbar spine extending into the lower thoracic region. Subsequently it appears that as best I can tell from his notes he likely had an epidural abscess which subsequently led to these open areas. He was seen by infectious disease, Rexene Alberts, who referred him to [place] for management of the wounds. Apparently he has completed his course of IV antibiotic therapy at this point. Patient did  have a technically limited examination of the cervical and thoracic spine by way of it and MRI. Unfortunately he was not able to tolerate the full length of the exam therefore only sagittal sections were obtained of the thoracic spine region. Fortunately there is no imaging findings to suggest spinal cord infarction. There was interval improvement in the epidural collection (epidural abscess) on this examination when compared to the prior examination which was 03/22/18. The date of this MRI was 03/30/18. Again there was no imaging performed of the lumbar spine at this time. No new disc guidance or facet arthritis noted. There is no evidence of osteomyelitis. Currently the patient does not have any significant pain which is good news. With that being said he is having some discomfort at this point mainly with packing of the wounds although I think packing is going to continue to be the treatment of choice. No fevers, chills, nausea, or vomiting noted at this time. 06/07/18 on evaluation today patient's wounds in the back region specifically the lumbar spine area appear to be doing very well at this time. In fact the 12 o'clock total of the cephalad wound shows evidence of not being quite as wide open as what I previously noted. In fact there is gonna be no chance of packing anything into this area at this time. The caudal wound actually shows signs of being about the same although again as I explained to the patient is  gonna take some time for this area to heal. Readmission: 09/20/18 patient seen today for readmission concerning issues that he has been having with his back I previously saw him for this towards the end of last year 2019. Subsequently he was lost to follow-up due to not returning to the clinic. Subsequently what brings them in today is actually a different issue with his right lower quadrant abdominal area/groin where he has what appears to be an abscess at this time that for the most part is resolving but he actually has eschar covering the surface of the wound currently. With that being said he states this has been painful although it's less painful now than it's been in the past. He did attempt to use a razor blade to actually remove some of this dark eschar prior to coming into the office today. With that being said he only did a very small portion and states he got too scared to continue. Again I believe it's best for him not to do this but rather allow [place] to do this if need be. Nonetheless I do think the escort coming off of benefit him he completely agrees. Fortunately there's no signs of active infection at this time. He does have some integration noted around the wound region. No fevers, chills, nausea, or vomiting noted at this time. Patient tells me that he currently is not utilizing any IV recreational drugs at this point he states he's doing very well in that regard. Readmission: 01/06/19 patient presents today for reevaluation in our clinic. I previously seen him four wounds on his back region in particular. KIARA, KEEP ([id number]) Nonetheless he actually has wounds of this time in regard to his bilateral lower extremities which unfortunately have been giving him some issues for several weeks. Fortunately there's no signs of significant infection although there does seem to be some irritation. He states this will start out as red patches that become somewhat tender and then open up  into ulcers. He has not gotten any answers as to what may be  causing this. He does have a history of opioid abuse but tells me he has not used the recreational drugs in the past nine months. He does have hypertension in multiple lower extremity leg ulcers and varying degrees of severity and tissue exposure noted of the bilateral lower extremities. He also has diabetes. There is no history of trauma he does not know where these came from. Readmission: 08/15/2019 patient presents today for reevaluation here in the clinic concerning issues that he has been having with a wound on his left lower extremity as well as his right forearm. Fortunately neither appears to be extremely severe which is good news the left leg area is a region that he has had multiple times in the past that we have been helping take care of him. Again this seems to be more due to scar tissue and breakdown he does not really have significant edema but I still wonder if this could be more venous in nature potentially just due to the recurrence. Nonetheless he tells me that the forearm happened while he was working on a car that has not been running 30 years. There is nothing hot I suspect a chemical burn to this area where he might of gotten something on and that he did not realize that caused an issue here. This is very superficial. Patient History Information obtained from Patient. Allergies No Known Drug Allergies Family History Cancer - Maternal Grandparents, Diabetes - Father, Heart Disease - Father, No family history of Hereditary Spherocytosis, Hypertension, Kidney Disease, Lung Disease, Seizures, Stroke, Thyroid Problems, Tuberculosis. Social History Current every day smoker, Marital Status - Single, Alcohol Use - Moderate, Drug Use - Prior History, Caffeine Use - Daily. Medical History Eyes Denies history of Cataracts, Glaucoma, Optic Neuritis Ear/Nose/Mouth/Throat Denies history of Chronic sinus  problems/congestion, Middle ear problems Hematologic/Lymphatic Denies history of Anemia, Hemophilia, Human Immunodeficiency Virus, Lymphedema, Sickle Cell Disease Respiratory Denies history of Aspiration, Asthma, Chronic Obstructive Pulmonary Disease (COPD), Pneumothorax, Sleep Apnea, Tuberculosis Cardiovascular Denies history of Angina, Arrhythmia, Congestive Heart Failure, Coronary Artery Disease, Deep Vein Thrombosis, Hypertension, Hypotension, Myocardial Infarction, Peripheral Arterial Disease, Peripheral Venous Disease, Phlebitis, Vasculitis Gastrointestinal Denies history of Cirrhosis , Colitis, Crohn s, Hepatitis A, Hepatitis B, Hepatitis C Endocrine Patient has history of Type II Diabetes - 7 years Denies history of Type I Diabetes Genitourinary Denies history of End Stage Renal Disease Immunological Denies history of Lupus Erythematosus, Raynaud s, Scleroderma Integumentary (Skin) Denies history of History of Burn, History of pressure wounds Musculoskeletal BERNADETTE, ARMIJO ([id number]) Denies history of Gout, Rheumatoid Arthritis, Osteoarthritis, Osteomyelitis Neurologic Denies history of Dementia, Neuropathy, Quadriplegia, Paraplegia, Seizure Disorder Oncologic Denies history of Received Chemotherapy, Received Radiation Review of Systems (ROS) Constitutional Symptoms (General Health) Denies complaints or symptoms of Fatigue, Fever, Chills, Marked Weight Change. Eyes Denies complaints or symptoms of Dry Eyes, Vision Changes, Glasses / Contacts. Ear/Nose/Mouth/Throat Denies complaints or symptoms of Difficult clearing ears, Sinusitis. Hematologic/Lymphatic Denies complaints or symptoms of Bleeding / Clotting Disorders, Human Immunodeficiency Virus. Respiratory Denies complaints or symptoms of Chronic or frequent coughs, Shortness of Breath. Cardiovascular Denies complaints or symptoms of Chest pain, LE edema. Gastrointestinal Denies complaints or symptoms of Frequent  diarrhea, Nausea, Vomiting. Endocrine Denies complaints or symptoms of Hepatitis, Thyroid disease, Polydypsia (Excessive Thirst). Genitourinary Denies complaints or symptoms of Kidney failure/ Dialysis, Incontinence/dribbling. Immunological Denies complaints or symptoms of Hives, Itching. Integumentary (Skin) Complains or has symptoms of Wounds. Denies complaints or symptoms of Bleeding or bruising tendency, Breakdown, Swelling. Musculoskeletal Denies complaints or  symptoms of Muscle Pain, Muscle Weakness. Neurologic Denies complaints or symptoms of Numbness/parasthesias, Focal/Weakness. Psychiatric Denies complaints or symptoms of Anxiety, Claustrophobia. Objective Constitutional sitting or standing blood pressure is within target range for patient.. pulse regular and within target range for patient.Marland Kitchen respirations regular, non-labored and within target range for patient.Marland Kitchen temperature within target range for patient.. Well- nourished and well-hydrated in no acute distress. Vitals Time Taken: 1:20 PM, Height: 71 in, Source: Stated, Weight: 153 lbs, Source: Measured, BMI: 21.3, Temperature: 98.9 F, Pulse: 99 bpm, Respiratory Rate: 16 breaths/min, Blood Pressure: 110/73 mmHg. Eyes conjunctiva clear no eyelid edema noted. pupils equal round and reactive to light and accommodation. JARNELL, CORDARO ([id number]) Ears, Nose, Mouth, and Throat no gross abnormality of ear auricles or external auditory canals. normal hearing noted during conversation. mucus membranes moist. Respiratory normal breathing without difficulty. Cardiovascular 2+ dorsalis pedis/posterior tibialis pulses. no clubbing, cyanosis, significant edema, Gastrointestinal (GI) soft, non-tender, non-distended, +BS. no ventral hernia noted. Musculoskeletal normal gait and posture. no significant deformity or arthritic changes, no loss or range of motion, no clubbing. Psychiatric this patient is able to make decisions and  demonstrates good insight into disease process. Alert and Oriented x 3. pleasant and cooperative. General Notes: Upon inspection patient's wounds on the leg there actually ended up being 2 of them appears to be doing quite well which is good news there is no signs of active infection at this time which is also good news and overall I feel like that this hopefully will heal quite readily. The patient tells me he is not taking his blood pressure medication although his blood pressure is doing well today states it seems to be doing well at home as well when he checks it he needs to probably talk to his primary care provider about this as he supposed to be on 2 medications that do not seem to be potentially necessary based on what we are seeing if he is not taking them currently. Patient's wound on the leg did require sharp debridement in regard to the larger of the 2 ulcers he tolerated that without complication today. No debridement was noted at in need any other location has been necessary. Integumentary (Hair, Skin) Wound #12 status is Open. Original cause of wound was Gradually Appeared. The wound is located on the Left,Medial Lower Leg. The wound measures 3cm length x 1.1cm width x 0.2cm depth; 2.592cm^2 area and 0.518cm^3 volume. There is Fat Layer (Subcutaneous Tissue) Exposed exposed. There is no tunneling or undermining noted. There is a medium amount of sanguinous drainage noted. The wound margin is flat and intact. There is small (1-33%) pink granulation within the wound bed. There is a large (67-100%) amount of necrotic tissue within the wound bed including Eschar and Adherent Slough. Wound #13 status is Open. Original cause of wound was Blister. The wound is located on the Right Forearm. The wound measures 1.3cm length x 2.7cm width x 0.1cm depth; 2.757cm^2 area and 0.276cm^3 volume. There is Fat Layer (Subcutaneous Tissue) Exposed exposed. There is no tunneling or undermining noted.  There is a medium amount of serous drainage noted. The wound margin is flat and intact. There is large (67-100%) pink granulation within the wound bed. There is a small (1-33%) amount of necrotic tissue within the wound bed including Adherent Slough. Wound #14 status is Open. Original cause of wound was Gradually Appeared. The wound is located on the Left,Proximal,Medial Lower Leg. The wound measures 0.7cm length x 0.5cm width x 0.2cm depth; 0.275cm^2  area and 0.055cm^3 volume. There is Fat Layer (Subcutaneous Tissue) Exposed exposed. There is no tunneling or undermining noted. There is a medium amount of drainage noted. There is medium (34-66%) red granulation within the wound bed. There is a medium (34-66%) amount of necrotic tissue within the wound bed including Adherent Slough. Assessment Active Problems ICD-10 Type 2 diabetes mellitus with other skin ulcer Non-pressure chronic ulcer of other part of left lower leg with fat layer exposed Unspecified open wound of right forearm, initial encounter CHANDLER, SWIDERSKI ([id number]) Opioid dependence, uncomplicated Essential (primary) hypertension Chronic viral hepatitis C Procedures Wound #12 Pre-procedure diagnosis of Wound #12 is a Diabetic Wound/Ulcer of the Lower Extremity located on the Left,Medial Lower Leg .Severity of Tissue Pre Debridement is: Fat layer exposed. There was a Excisional Skin/Subcutaneous Tissue Debridement with a total area of 3.3 sq cm performed by STONE III, Hildagard Sobecki E., PA-C. With the following instrument(s): Curette to remove Viable and Non-Viable tissue/material. Material removed includes Eschar, Subcutaneous Tissue, and Slough after achieving pain control using Lidocaine. A time out was conducted at 14:18, prior to the start of the procedure. A Minimum amount of bleeding was controlled with Pressure. The procedure was tolerated well. Post Debridement Measurements: 3cm length x 1.1cm width x 0.2cm depth; 0.518cm^3  volume. Character of Wound/Ulcer Post Debridement is stable. Severity of Tissue Post Debridement is: Fat layer exposed. Post procedure Diagnosis Wound #12: Same as Pre-Procedure Plan Wound Cleansing: Wound #12 Left,Medial Lower Leg: Clean wound with Normal Saline. - in office Cleanse wound with mild soap and water Wound #13 Right Forearm: Clean wound with Normal Saline. - in office Cleanse wound with mild soap and water Wound #14 Left,Proximal,Medial Lower Leg: Clean wound with Normal Saline. - in office Cleanse wound with mild soap and water Primary Wound Dressing: Wound #12 Left,Medial Lower Leg: Silver Collagen Wound #13 Right Forearm: Silver Collagen Wound #14 Left,Proximal,Medial Lower Leg: Silver Collagen Secondary Dressing: Wound #12 Left,Medial Lower Leg: [place] Wound #13 Right Forearm: [place] Wound #14 Left,Proximal,Medial Lower Leg: [place] Dressing Change Frequency: Wound #12 Left,Medial Lower Leg: Change dressing every other day. Wound #13 Right Forearm: Change dressing every other day. DALBERT, STILLINGS ([id number]) Wound #14 Left,Proximal,Medial Lower Leg: Change dressing every other day. Follow-up Appointments: Wound #12 Left,Medial Lower Leg: Return Appointment in 1 week. Wound #13 Right Forearm: Return Appointment in 1 week. Wound #14 Left,Proximal,Medial Lower Leg: Return Appointment in 1 week. 1. My suggestion currently is going to be that we go ahead and initiate treatment with a silver collagen dressing. I think this is good to be appropriate for all 3 wound locations and subsequently we can cover the areas with a [place] dressing. 2. I do recommend that he clean with Dial antibacterial soap and water he can use Hibiclens as well which I recommended in the past and this seemed to be helpful for him he has not done that in a while. 3. We will monitor for any signs of significant swelling although right now I really do not see  evidence of that although I am beginning to suspect a potential venous type issue could be present although it may be more of a vasculopathy type  not really a venous stasis issue. Depending on how the wound looks next week I may also consider as well biopsy on the leg ulcer on the left. We will see patient back for reevaluation in 1 week here in the clinic. If anything worsens or changes patient will contact our  office for additional recommendations. Electronic Signature(s) Signed: 08/15/2019 2:25:29 PM By: Lenda Kelp PA-C Entered By: Lenda Kelp on 08/15/2019 14:25:29 [name] ([id number]) -------------------------------------------------------------------------------- ROS/PFSH Details Patient Name: [name] Date of Service: 08/15/2019 1:15 PM Medical Record Number: [id number] Patient Account Number: [id number] Date of Birth/Sex: 05-21-1976 (44 y.o. M) Treating RN: Curtis Sites Primary Care Provider: Raliegh Ip Other Clinician: Referring Provider: Raliegh Ip Treating Provider/Extender: Linwood Dibbles, Lynwood Kubisiak Weeks in Treatment: 0 Information Obtained From Patient Constitutional Symptoms (General Health) Complaints and Symptoms: Negative for: Fatigue; Fever; Chills; Marked Weight Change Eyes Complaints and Symptoms: Negative for: Dry Eyes; Vision Changes; Glasses / Contacts Medical History: Negative for: Cataracts; Glaucoma; Optic Neuritis Ear/Nose/Mouth/Throat Complaints and Symptoms: Negative for: Difficult clearing ears; Sinusitis Medical History: Negative for: Chronic sinus problems/congestion; Middle ear problems Hematologic/Lymphatic Complaints and Symptoms: Negative for: Bleeding / Clotting Disorders; Human Immunodeficiency Virus Medical History: Negative for: Anemia; Hemophilia; Human Immunodeficiency Virus; Lymphedema; Sickle Cell Disease Respiratory Complaints and Symptoms: Negative for: Chronic or frequent coughs; Shortness of  Breath Medical History: Negative for: Aspiration; Asthma; Chronic Obstructive Pulmonary Disease (COPD); Pneumothorax; Sleep Apnea; Tuberculosis Cardiovascular Complaints and Symptoms: Negative for: Chest pain; LE edema Medical History: Negative for: Angina; Arrhythmia; Congestive Heart Failure; Coronary Artery Disease; Deep Vein Thrombosis; Hypertension; Hypotension; Myocardial Infarction; Peripheral Arterial Disease; Peripheral Venous Disease; Phlebitis; Vasculitis SAIR, KARAU ([id number]) Gastrointestinal Complaints and Symptoms: Negative for: Frequent diarrhea; Nausea; Vomiting Medical History: Negative for: Cirrhosis ; Colitis; Crohnos; Hepatitis A; Hepatitis B; Hepatitis C Endocrine Complaints and Symptoms: Negative for: Hepatitis; Thyroid disease; Polydypsia (Excessive Thirst) Medical History: Positive for: Type II Diabetes - 7 years Negative for: Type I Diabetes Time with diabetes: 7 years Treated with: Insulin Genitourinary Complaints and Symptoms: Negative for: Kidney failure/ Dialysis; Incontinence/dribbling Medical History: Negative for: End Stage Renal Disease Immunological Complaints and Symptoms: Negative for: Hives; Itching Medical History: Negative for: Lupus Erythematosus; Raynaudos; Scleroderma Integumentary (Skin) Complaints and Symptoms: Positive for: Wounds Negative for: Bleeding or bruising tendency; Breakdown; Swelling Medical History: Negative for: History of Burn; History of pressure wounds Musculoskeletal Complaints and Symptoms: Negative for: Muscle Pain; Muscle Weakness Medical History: Negative for: Gout; Rheumatoid Arthritis; Osteoarthritis; Osteomyelitis Neurologic Complaints and Symptoms: Negative for: Numbness/parasthesias; Focal/Weakness Medical HistoryGEOFFERY, STOLARSKI ([id number]) Negative for: Dementia; Neuropathy; Quadriplegia; Paraplegia; Seizure Disorder Psychiatric Complaints and Symptoms: Negative for: Anxiety;  Claustrophobia Oncologic Medical History: Negative for: Received Chemotherapy; Received Radiation Immunizations Pneumococcal Vaccine: Received Pneumococcal Vaccination: No Implantable Devices None Family and Social History Cancer: Yes - Maternal Grandparents; Diabetes: Yes - Father; Heart Disease: Yes - Father; Hereditary Spherocytosis: No; Hypertension: No; Kidney Disease: No; Lung Disease: No; Seizures: No; Stroke: No; Thyroid Problems: No; Tuberculosis: No; Current every day smoker; Marital Status - Single; Alcohol Use: Moderate; Drug Use: Prior History; Caffeine Use: Daily; Financial Concerns: No; Food, Clothing or Shelter Needs: No; Support System Lacking: No; Transportation Concerns: No Electronic Signature(s) Signed: 08/15/2019 4:29:13 PM By: Curtis Sites Signed: 08/15/2019 4:43:11 PM By: Lenda Kelp PA-C Entered By: Curtis Sites on 08/15/2019 13:31:05 [name] ([id number]) -------------------------------------------------------------------------------- SuperBill Details Patient Name: [name] Date of Service: 08/15/2019 Medical Record Number: [id number] Patient Account Number: [id number] Date of Birth/Sex: 21-Jun-1976 (44 y.o. M) Treating RN: Huel Coventry Primary Care Provider: Raliegh Ip Other Clinician: Referring Provider: Raliegh Ip Treating Provider/Extender: Linwood Dibbles, Geary Rufo Weeks in Treatment: 0 Diagnosis Coding ICD-10 Codes Code Description E11.622 Type 2 diabetes mellitus with other skin ulcer L97.822 Non-pressure chronic ulcer of other part of left lower leg with fat layer  exposed S51.801A Unspecified open wound of right forearm, initial encounter F11.20 Opioid dependence, uncomplicated I10 Essential (primary) hypertension B18.2 Chronic viral hepatitis C Facility Procedures CPT4 Code Description: [id number] 99213 - WOUND CARE VISIT-LEV 3 EST PT Modifier: Quantity: 1 CPT4 Code Description: [id number] 11042 - DEB SUBQ TISSUE 20 SQ CM/<  ICD-10 Diagnosis Description L97.822 Non-pressure chronic ulcer of other part of left lower leg with Modifier: fat layer expos Quantity: 1 ed Physician Procedures CPT4 Code Description: [id number] 99213 - WC PHYS LEVEL 3 - EST PT ICD-10 Diagnosis Description E11.622 Type 2 diabetes mellitus with other skin ulcer L97.822 Non-pressure chronic ulcer of other part of left lower leg with S51.801A Unspecified open wound of  right forearm, initial encounter F11.20 Opioid dependence, uncomplicated Modifier: 25 fat layer expos Quantity: 1 ed CPT4 Code Description: [id number] 11042 - WC PHYS SUBQ TISS 20 SQ CM ICD-10 Diagnosis Description L97.822 Non-pressure chronic ulcer of other part of left lower leg with Modifier: fat layer expos Quantity: 1 ed Electronic Signature(s) Signed: 08/15/2019 2:26:03 PM By: Lenda KelpStone III, Evert Wenrich PA-C Entered By: Lenda KelpStone III, Ceaser Ebeling on 08/15/2019 14:26:03

## 2019-08-22 ENCOUNTER — Other Ambulatory Visit: Payer: Self-pay

## 2019-08-22 ENCOUNTER — Encounter: Payer: Medicaid Other | Admitting: Physician Assistant

## 2019-08-22 DIAGNOSIS — L97822 Non-pressure chronic ulcer of other part of left lower leg with fat layer exposed: Secondary | ICD-10-CM | POA: Diagnosis not present

## 2019-08-22 DIAGNOSIS — E11622 Type 2 diabetes mellitus with other skin ulcer: Secondary | ICD-10-CM | POA: Diagnosis not present

## 2019-08-22 DIAGNOSIS — L98499 Non-pressure chronic ulcer of skin of other sites with unspecified severity: Secondary | ICD-10-CM | POA: Diagnosis not present

## 2019-08-22 NOTE — Progress Notes (Addendum)
ALCUS, BRADLY ([id number]) Visit Report for 08/22/2019 Arrival Information Details Patient Name: Tony Long, Tony Long Date of Service: 08/22/2019 2:45 PM Medical Record Number: [id number] Patient Account Number: [id number] Date of Birth/Sex: 07-08-1975 (44 y.o. M) Treating RN: Huel Coventry Primary Care Othelia Riederer: Raliegh Ip Other Clinician: Referring Nekoda Chock: Raliegh Ip Treating Kaylob Wallen/Extender: Linwood Dibbles, HOYT Weeks in Treatment: 1 Visit Information History Since Last Visit Added or deleted any medications: No Patient Arrived: Ambulatory Any new allergies or adverse reactions: No Arrival Time: 15:05 Had a fall or experienced change in No Accompanied By: self activities of daily living that may affect Transfer Assistance: None risk of falls: Patient Identification Verified: Yes Signs or symptoms of abuse/neglect since last visito No Secondary Verification Process Completed: Yes Hospitalized since last visit: No Implantable device outside of the clinic excluding No cellular tissue based products placed in the center since last visit: Has Dressing in Place as Prescribed: Yes Pain Present Now: Yes Electronic Signature(s) Signed: 08/22/2019 4:34:46 PM By: Dayton Martes RCP, RRT, CHT Entered By: Dayton Martes on 08/22/2019 15:06:20 Tony Long ([id number]) -------------------------------------------------------------------------------- Clinic Level of Care Assessment Details Patient Name: Tony Long Date of Service: 08/22/2019 2:45 PM Medical Record Number: [id number] Patient Account Number: [id number] Date of Birth/Sex: [birth year] (44 y.o. M) Treating RN: Rodell Perna Primary Care Adeoluwa Silvers: Raliegh Ip Other Clinician: Referring Unita Detamore: Raliegh Ip Treating Jewel Venditto/Extender: Linwood Dibbles, HOYT Weeks in Treatment: 1 Clinic Level of Care Assessment Items TOOL 4 Quantity Score []  - Use when only an EandM is performed on  FOLLOW-UP visit 0 ASSESSMENTS - Nursing Assessment / Reassessment X - Reassessment of Co-morbidities (includes updates in patient status) 1 10 X- 1 5 Reassessment of Adherence to Treatment Plan ASSESSMENTS - Wound and Skin Assessment / Reassessment []  - Simple Wound Assessment / Reassessment - one wound 0 X- 2 5 Complex Wound Assessment / Reassessment - multiple wounds []  - 0 Dermatologic / Skin Assessment (not related to wound area) ASSESSMENTS - Focused Assessment []  - Circumferential Edema Measurements - multi extremities 0 []  - 0 Nutritional Assessment / Counseling / Intervention []  - 0 Lower Extremity Assessment (monofilament, tuning fork, pulses) []  - 0 Peripheral Arterial Disease Assessment (using hand held doppler) ASSESSMENTS - Ostomy and/or Continence Assessment and Care []  - Incontinence Assessment and Management 0 []  - 0 Ostomy Care Assessment and Management (repouching, etc.) PROCESS - Coordination of Care X - Simple Patient / Family Education for ongoing care 1 15 []  - 0 Complex (extensive) Patient / Family Education for ongoing care []  - 0 Staff obtains , Records, Test Results / Process Orders []  - 0 Staff telephones HHA, Nursing Homes / Clarify orders / etc []  - 0 Routine Transfer to another Facility (non-emergent condition) []  - 0 Routine Hospital Admission (non-emergent condition) []  - 0 New Admissions / / Ordering NPWT, Apligraf, etc. []  - 0 Emergency Hospital Admission (emergent condition) X- 1 10 Simple Discharge Coordination []  - 0 Complex (extensive) Discharge Coordination PROCESS - Special Needs []  - Pediatric / Minor Patient Management 0 []  - 0 Isolation Patient Management []  - 0 Hearing / Language / Visual special needs []  - 0 Assessment of Community assistance (transportation, D/C planning, etc.) Tony Long, Tony Long ( ) []  - 0 Additional assistance / Altered mentation []  - 0 Support Surface(s)  Assessment (bed, cushion, seat, etc.) INTERVENTIONS - Wound Cleansing / Measurement []  - Simple Wound Cleansing - one wound 0 X- 2 5 Complex Wound Cleansing - multiple wounds X- 1 5 Wound Imaging (photographs -  any number of wounds) []  - 0 Wound Tracing (instead of photographs) []  - 0 Simple Wound Measurement - one wound X- 2 5 Complex Wound Measurement - multiple wounds INTERVENTIONS - Wound Dressings []  - Small Wound Dressing one or multiple wounds 0 X- 2 15 Medium Wound Dressing one or multiple wounds []  - 0 Large Wound Dressing one or multiple wounds []  - 0 Application of Medications - topical []  - 0 Application of Medications - injection INTERVENTIONS - Miscellaneous []  - External ear exam 0 []  - 0 Specimen Collection (cultures, biopsies, blood, body fluids, etc.) []  - 0 Specimen(s) / Culture(s) sent or taken to Lab for analysis []  - 0 Patient Transfer (multiple staff / Civil Service fast streamer / Similar devices) []  - 0 Simple Staple / Suture removal (25 or less) []  - 0 Complex Staple / Suture removal (26 or more) []  - 0 Hypo / Hyperglycemic Management (close monitor of Blood Glucose) []  - 0 Ankle / Brachial Index (ABI) - do not check if billed separately X- 1 5 Vital Signs Has the patient been seen at the hospital within the last three years: Yes Total Score: 110 Level Of Care: New/Established - Level 3 Electronic Signature(s) Signed: 08/22/2019 4:40:14 PM By: Army Melia Entered By: Army Melia on 08/22/2019 15:49:43 Tony Long ([id number]) -------------------------------------------------------------------------------- Encounter Discharge Information Details Patient Name: Tony Long Date of Service: 08/22/2019 2:45 PM Medical Record Number: [id number] Patient Account Number: [id number] Date of Birth/Sex: 1976/03/03 (44 y.o. M) Treating RN: Army Melia Primary Care Randalyn Ahmed: Kathe Becton Other Clinician: Referring Lourdez Mcgahan: Kathe Becton Treating  Adonte Vanriper/Extender: Melburn Hake, HOYT Weeks in Treatment: 1 Encounter Discharge Information Items Discharge Condition: Stable Ambulatory Status: Ambulatory Discharge Destination: Home Transportation: Private Auto Accompanied By: self Schedule Follow-up Appointment: Yes Clinical Summary of Care: Electronic Signature(s) Signed: 08/22/2019 4:40:14 PM By: Army Melia Entered By: Army Melia on 08/22/2019 15:50:41 Tony Long ([id number]) -------------------------------------------------------------------------------- Lower Extremity Assessment Details Patient Name: Tony Long Date of Service: 08/22/2019 2:45 PM Medical Record Number: [id number] Patient Account Number: [id number] Date of Birth/Sex: 09-09-1975 (44 y.o. M) Treating RN: Montey Hora Primary Care Trenisha Lafavor: Kathe Becton Other Clinician: Referring Leyton Magoon: Kathe Becton Treating Joshalyn Ancheta/Extender: STONE III, HOYT Weeks in Treatment: 1 Edema Assessment Assessed: [Left: No] [Right: No] Edema: [Left: N] [Right: o] Vascular Assessment Pulses: Dorsalis Pedis Palpable: [Left:Yes] Electronic Signature(s) Signed: 08/22/2019 4:53:18 PM By: Montey Hora Entered By: Montey Hora on 08/22/2019 15:20:40 Tony Long ([id number]) -------------------------------------------------------------------------------- Multi Wound Chart Details Patient Name: Tony Long Date of Service: 08/22/2019 2:45 PM Medical Record Number: [id number] Patient Account Number: [id number] Date of Birth/Sex: 1975-10-25 (44 y.o. 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 Extremity Comorbid History: Type II Diabetes Type II Diabetes Type II Diabetes Date Acquired: 08/08/2019 08/01/2019 08/01/2019 Weeks of Treatment: 1 1 1  Wound Status: Open Open Open Measurements L x W x D (cm) 3.9x3.2x0.2 0.1x0.1x0.1 0.6x0.4x0.1 Area (cm) : 9.802 0.008 0.188 Volume (cm) : 1.96 0.001 0.019 % Reduction in Area: -278.20% 99.70% 31.60% % Reduction in Volume: -278.40% 99.60% 65.50% Classification: Grade 1 Full Thickness Without Exposed Grade  2 Support Structures Exudate Amount: Medium None Present Medium Exudate Type: Sanguinous N/A N/A Exudate Color: red N/A N/A Wound Margin: Flat and Intact Flat and Intact N/A Granulation Amount: Small (1-33%) None Present (0%) Medium (34-66%) Granulation Quality: Pink N/A Red Necrotic Amount: Large (67-100%) None Present (0%) Medium (34-66%) Necrotic Tissue: Eschar, Adherent Slough N/A Adherent Slough Exposed Structures: Fat Layer (Subcutaneous Tissue) Fascia: No Fat Layer (Subcutaneous Tissue) Exposed: Yes Fat Layer (Subcutaneous Tissue) Exposed: Yes Fascia: No Exposed: No Fascia: No Tendon: No Tendon: No Tendon: No Muscle: No Muscle: No Muscle: No Joint: No Joint: No Joint: No Bone: No Bone: No Bone: No Limited to Skin Breakdown Epithelialization: Small (1-33%) Large (67-100%) Small (1-33%) Treatment Notes Electronic Signature(s) Signed: 08/22/2019 4:40:14 PM By: Gifford Shave ([id number]) Entered By: Rodell Perna on 08/22/2019 15:46:33 Tony Long ([id number]) -------------------------------------------------------------------------------- Multi-Disciplinary Care Plan Details Patient Name: Tony Long Date of Service: 08/22/2019 2:45 PM Medical Record Number:  [id number] Patient Account Number: [id number] Date of Birth/Sex: [birth year] (44 y.o. M) Treating RN: Rodell Perna Primary Care Vaughn Beaumier: Raliegh Ip Other Clinician: Referring Sharlee Rufino: Raliegh Ip Treating Aditi Rovira/Extender: Linwood Dibbles, HOYT Weeks in Treatment: 1 Active Inactive Orientation to the Wound Care Program Nursing Diagnoses: Knowledge deficit related to the wound healing center program Goals: Patient/caregiver will verbalize understanding of the Wound Healing Center Program Date Initiated: 08/15/2019 Target Resolution Date: 09/09/2019 Goal Status: Active Interventions: Provide education on orientation to the wound center Notes: Wound/Skin Impairment Nursing Diagnoses: Impaired tissue integrity Goals: Ulcer/skin breakdown will have a volume reduction of 30% by week 4 Date Initiated: 08/15/2019 Target Resolution Date: 09/09/2019 Goal Status: Active Interventions: Assess ulceration(s) every visit Provide education on smoking Notes: Electronic Signature(s) Signed: 08/22/2019 4:40:14 PM By: Rodell Perna Entered By: Rodell Perna on 08/22/2019 15:46:26 Tony Long ([id number]) -------------------------------------------------------------------------------- Pain Assessment Details Patient Name: Tony Long Date of Service: 08/22/2019 2:45 PM Medical Record Number: [id number] Patient Account Number: [id number] Date of Birth/Sex: 01/09/1976 (44 y.o. M) Treating RN: Curtis Sites Primary Care Ivonne Freeburg: Raliegh Ip Other Clinician: Referring Shevaun Lovan: Raliegh Ip Treating Deziya Amero/Extender: Linwood Dibbles, HOYT Weeks in Treatment: 1 Active Problems Location of Pain Severity and Description of Pain Patient Has Paino Yes Site Locations Pain Location: Pain in Ulcers Duration of the Pain. Constant / Intermittento Intermittent Pain Management and Medication Current Pain Management: Electronic Signature(s) Signed: 08/22/2019 4:53:18 PM By: Curtis Sites Entered By: Curtis Sites on 08/22/2019 15:12:13 Tony Long ([id number]) -------------------------------------------------------------------------------- Patient/Caregiver Education Details Patient Name: Tony Long Date of Service: 08/22/2019 2:45 PM Medical Record Number: [id number] Patient Account Number: [id number] Date of Birth/Gender: [birth year] (44 y.o. M) Treating RN: Rodell Perna Primary Care Physician: Raliegh Ip Other Clinician: Referring Physician: Raliegh Ip Treating Physician/Extender: Skeet Simmer in Treatment: 1 Education Assessment Education Provided To: Patient Education Topics Provided Wound/Skin Impairment: Handouts: Caring for Your Ulcer Methods: Demonstration, Explain/Verbal Responses: State content correctly Electronic Signature(s) Signed: 08/22/2019 4:40:14 PM By: Rodell Perna Entered By: Rodell Perna on 08/22/2019 15:49:55 Tony Long ([id number]) -------------------------------------------------------------------------------- Wound Assessment Details Patient Name: Tony Long Date of Service: 08/22/2019 2:45 PM Medical Record Number: [id number] Patient Account Number: [id number] Date of Birth/Sex: 05-Dec-1975 (44 y.o. M) Treating RN: Curtis Sites Primary Care Boyde Grieco: Raliegh Ip Other Clinician: Referring Vidyuth Belsito: Raliegh Ip Treating Jamilee Lafosse/Extender: STONE III, HOYT Weeks in Treatment: 1 Wound Status Wound Number: 12 Primary Etiology: Diabetic Wound/Ulcer of the Lower Extremity Wound Location: Left, Medial Lower Leg Wound Status: Open Wounding Event: Gradually Appeared Comorbid History: Type II Diabetes Date Acquired: 08/08/2019 Tania Ade  Of Treatment: 1 Clustered Wound: No Photos Wound Measurements Length: (cm) 3.9 % Redu Width: (cm) 3.2 % Redu Depth: (cm) 0.2 Epithe Area: (cm) 9.802 Tunne Volume: (cm) 1.96 Under ction in Area: -278.2% ction in Volume: -278.4% lialization: Small  (1-33%) ling: No mining: No Wound Description Classification: Grade 1 Foul O Wound Margin: Flat and Intact Slough Exudate Amount: Medium Exudate Type: Sanguinous Exudate Color: red dor After Cleansing: No /Fibrino Yes Wound Bed Granulation Amount: Small (1-33%) Exposed Structure Granulation Quality: Pink Fascia Exposed: No Necrotic Amount: Large (67-100%) Fat Layer (Subcutaneous Tissue) Exposed: Yes Necrotic Quality: Eschar, Adherent Slough Tendon Exposed: No Muscle Exposed: No Joint Exposed: No Bone Exposed: No Treatment Notes Wound #12 (Left, Medial Lower Leg) Notes prisma, Social worker Signature(s) Signed: 08/22/2019 4:53:18 PM By: Horton Chin, Apolinar Junes ([id number]) Entered By: Curtis Sites on 08/22/2019 15:19:45 Tony Long ([id number]) -------------------------------------------------------------------------------- Wound Assessment Details Patient Name: Tony Long Date of Service: 08/22/2019 2:45 PM Medical Record Number: [id number] Patient Account Number: [id number] Date of Birth/Sex: [birth year] (44 y.o. M) Treating RN: Rodell Perna Primary Care Eaden Hettinger: Raliegh Ip Other Clinician: Referring Ira Busbin: Raliegh Ip Treating Stockton Nunley/Extender: STONE III, HOYT Weeks in Treatment: 1 Wound Status Wound Number: 13 Primary Etiology: Skin Tear Wound Location: Right Forearm Wound Status: Healed - Epithelialized Wounding Event: Blister Comorbid History: Type II Diabetes Date Acquired: 08/01/2019 Weeks Of Treatment: 1 Clustered Wound: No Photos Wound Measurements Length: (cm) 0 Width: (cm) 0 Depth: (cm) 0 Area: (cm) Volume: (cm) % Reduction in Area: 100% % Reduction in Volume: 100% Epithelialization: Large (67-100%) 0 Tunneling: No 0 Undermining: No Wound Description Classification: Full Thickness Without Exposed Support Structure Wound Margin: Flat and Intact Exudate Amount: None Present s Foul Odor After Cleansing:  No Slough/Fibrino Yes Wound Bed Granulation Amount: None Present (0%) Exposed Structure Necrotic Amount: None Present (0%) Fascia Exposed: No Fat Layer (Subcutaneous Tissue) Exposed: No Tendon Exposed: No Muscle Exposed: No Joint Exposed: No Bone Exposed: No Limited to Skin Breakdown Electronic Signature(s) Signed: 08/22/2019 4:40:14 PM By: Rodell Perna Entered By: Rodell Perna on 08/22/2019 15:47:35 Tony Long ([id number]) -------------------------------------------------------------------------------- Wound Assessment Details Patient Name: Tony Long Date of Service: 08/22/2019 2:45 PM Medical Record Number: [id number] Patient Account Number: [id number] Date of Birth/Sex: [birth year] (44 y.o. M) Treating RN: Curtis Sites Primary Care Jessey Stehlin: Raliegh Ip Other Clinician: Referring Shaela Boer: Raliegh Ip Treating Effrey Davidow/Extender: STONE III, HOYT Weeks in Treatment: 1 Wound Status Wound Number: 14 Primary Etiology: Diabetic Wound/Ulcer of the Lower Extremity Wound Location: Left, Proximal, Medial Lower Leg Wound Status: Open Wounding Event: Gradually Appeared Comorbid History: Type II Diabetes Date Acquired: 08/01/2019 Weeks Of Treatment: 1 Clustered Wound: No Photos Wound Measurements Length: (cm) 0.6 Width: (cm) 0.4 Depth: (cm) 0.1 Area: (cm) 0.188 Volume: (cm) 0.019 % Reduction in Area: 31.6% % Reduction in Volume: 65.5% Epithelialization: Small (1-33%) Tunneling: No Undermining: No Wound Description Classification: Grade 2 Exudate Amount: Medium Foul Odor After Cleansing: No Slough/Fibrino Yes Wound Bed Granulation Amount: Medium (34-66%) Exposed Structure Granulation Quality: Red Fascia Exposed: No Necrotic Amount: Medium (34-66%) Fat Layer (Subcutaneous Tissue) Exposed: Yes Necrotic Quality: Adherent Slough Tendon Exposed: No Muscle Exposed: No Joint Exposed: No Bone Exposed: No Treatment Notes Wound #14 (Left, Proximal, Medial  Lower Leg) Notes prisma, Social worker Signature(s) Signed: 08/22/2019 4:53:18 PM By: Curtis Sites Entered By: Curtis Sites on 08/22/2019 15:19:48 Tony Long ([id number]) Tony Long ([id number]) -------------------------------------------------------------------------------- Vitals Details Patient Name: Tony Long Date of Service: 08/22/2019 2:45 PM Medical Record Number: [id number] Patient Account Number:  [id number] Date of Birth/Sex: May 01, 1976 (44 y.o. M) Treating RN: Huel Coventry Primary Care Kenley Troop: Raliegh Ip Other Clinician: Referring Cydni Reddoch: Raliegh Ip Treating Shaarav Ripple/Extender: Linwood Dibbles, HOYT Weeks in Treatment: 1 Vital Signs Time Taken: 15:05 Temperature (F): 98.4 Height (in): 71 Pulse (bpm): 89 Weight (lbs): 153 Respiratory Rate (breaths/min): 16 Body Mass Index (BMI): 21.3 Blood Pressure (mmHg): 105/62 Reference Range: 80 - 120 mg / dl Electronic Signature(s) Signed: 08/22/2019 4:34:46 PM By: Dayton Martes RCP, RRT, CHT Entered By: Dayton Martes on 08/22/2019 15:06:46

## 2019-08-22 NOTE — Progress Notes (Addendum)
Tony Long ([id number]) Visit Report for 08/22/2019 Chief Complaint Document Details Patient Name: Tony Long Date of Service: 08/22/2019 2:45 PM Medical Record Number: [id number] Patient Account Number: [id number] Date of Birth/Sex: [birth year] (44 y.o. M) Treating RN: Huel Coventry Primary Care Provider: Raliegh Ip Other Clinician: Referring Provider: Raliegh Ip Treating Provider/Extender: Linwood Dibbles, Johneric Mcfadden Weeks in Treatment: 1 Information Obtained from: Patient Chief Complaint Right forearm and Left LE Ulcers Electronic Signature(s) Signed: 08/22/2019 2:54:50 PM By: Lenda Kelp PA-C Entered By: Lenda Kelp on 08/22/2019 14:54:50 Tony Long ([id number]) -------------------------------------------------------------------------------- HPI Details Patient Name: Tony Long Date of Service: 08/22/2019 2:45 PM Medical Record Number: [id number] Patient Account Number: [id number] Date of Birth/Sex: Nov 17, 1975 (44 y.o. M) Treating RN: Huel Coventry Primary Care Provider: Raliegh Ip Other Clinician: Referring Provider: Raliegh Ip Treating Provider/Extender: Linwood Dibbles, Earnest Mcgillis Weeks in Treatment: 1 History of Present Illness Associated Signs and Symptoms: Patient does have a history of chronic viral hepatitis see, hypertension, and opioid dependence. HPI Description: 05/31/18 on evaluation today patient presents for initial evaluation or office for two issues that he's been having with openings in the lumbar spine extending into the lower thoracic region. Subsequently it appears that as best I can tell from his notes he likely had an epidural abscess which subsequently led to these open areas. He was seen by infectious disease, Rexene Alberts, who referred him to [place] for management of the wounds. Apparently he has completed his course of IV antibiotic therapy at this point. Patient did have a technically limited examination of the cervical and thoracic spine  by way of it and MRI. Unfortunately he was not able to tolerate the full length of the exam therefore only sagittal sections were obtained of the thoracic spine region. Fortunately there is no imaging findings to suggest spinal cord infarction. There was interval improvement in the epidural collection (epidural abscess) on this examination when compared to the prior examination which was 03/22/18. The date of this MRI was 03/30/18. Again there was no imaging performed of the lumbar spine at this time. No new disc guidance or facet arthritis noted. There is no evidence of osteomyelitis. Currently the patient does not have any significant pain which is good news. With that being said he is having some discomfort at this point mainly with packing of the wounds although I think packing is going to continue to be the treatment of choice. No fevers, chills, nausea, or vomiting noted at this time. 06/07/18 on evaluation today patient's wounds in the back region specifically the lumbar spine area appear to be doing very well at this time. In fact the 12 o'clock total of the cephalad wound shows evidence of not being quite as wide open as what I previously noted. In fact there is gonna be no chance of packing anything into this area at this time. The caudal wound actually shows signs of being about the same although again as I explained to the patient is gonna take some time for this area to heal. Readmission: 09/20/18 patient seen today for readmission concerning issues that he has been having with his back I previously saw him for this towards the end of last year 2019. Subsequently he was lost to follow-up due to not returning to the clinic. Subsequently what brings them in today is actually a different issue with his right lower quadrant abdominal area/groin where he has what appears to be an abscess at this time that for the most part is resolving but he actually has  eschar covering the surface of the wound  currently. With that being said he states this has been painful although it's less painful now than it's been in the past. He did attempt to use a razor blade to actually remove some of this dark eschar prior to coming into the office today. With that being said he only did a very small portion and states he got too scared to continue. Again I believe it's best for him not to do this but rather allow us to do this if need be. Nonetheless I do think the escort coming off of benefit him he completely agrees. Fortunately there's no signs of active infection at this time. He does have some integration noted around the wound region. No fevers, chills, nausea, or vomiting noted at this time. Patient tells me that he currently is not utilizing any IV recreational drugs at this point he states he's doing very well in that regard. Readmission: 01/06/19 patient presents today for reevaluation in our clinic. I previously seen him four wounds on his back region in particular. Nonetheless he actually has wounds of this time in regard to his bilateral lower extremities which unfortunately have been giving him some issues for several weeks. Fortunately there's no signs of significant infection although there does seem to be some irritation. He states this will start out as red patches that become somewhat tender and then open up into ulcers. He has not gotten any answers as to what may be causing this. He does have a history of opioid abuse but tells me he has not used the recreational drugs in the past nine months. He does have hypertension in multiple lower extremity leg ulcers and varying degrees of severity and tissue exposure noted of the bilateral lower extremities. He also has diabetes. There is no history of trauma he does not know where these came from. Readmission: 08/15/2019 patient presents today for reevaluation here in the clinic concerning issues that he has been having with a wound on his left lower  extremity as well as his right forearm. Fortunately neither appears to be extremely severe which is good news the left leg area is a region that he has had multiple times in the past that we have been helping take care of him. Again this seems to be more due to scar tissue and breakdown he does not really have significant edema but I still wonder if this could be more venous in nature potentially just due to the recurrence. Nonetheless he tells me that the forearm happened while he was working on a car that has not been running 30 years. There is nothing hot I suspect a chemical burn to this area where he might of gotten something on and that he did not realize that caused an issue here. This is very superficial. 08/22/2019 upon evaluation today patient appears to be doing well in general in regard to his arm ulcer as well as his proximal lower leg ulcer. Both are healing quite nicely and the arm in fact appears to be completely healed. Unfortunately the wound around his left medial lower leg distally is not doing as well it does not look terrible but at the same time there is tendon exposed is definitely a little bit deeper. He finds it strange that this is something that healed about a year ago and it was in the exact same location and pretty much looked identical to him. And then reopen. I am beginning to be somewhat suspicious about  the possibility of a skin cancer. Obviously if it is there is something that we need to do with that sooner rather than later to not let this get out of control. Once I mention this the patient became somewhat reluctant to want to proceed with the biopsy. He states in general "I am the kind of person that would prefer not to know if it is cancer". I explained further that again this is something that if it is a skin cancer can be taken care of now if you wait for to spread it can be much more significant and even life-threatening. ADRIK, KHIM ([id number]) Electronic  Signature(s) Signed: 08/22/2019 3:53:59 PM By: Lenda Kelp PA-C Entered By: Lenda Kelp on 08/22/2019 15:53:58 Tony Long ([id number]) -------------------------------------------------------------------------------- Physical Exam Details Patient Name: Tony Long Date of Service: 08/22/2019 2:45 PM Medical Record Number: [id number] Patient Account Number: [id number] Date of Birth/Sex: [birth year] (44 y.o. M) Treating RN: Huel Coventry Primary Care Provider: Raliegh Ip Other Clinician: Referring Provider: Raliegh Ip Treating Provider/Extender: STONE III, Jailyn Leeson Weeks in Treatment: 1 Constitutional Well-nourished and well-hydrated in no acute distress. Respiratory normal breathing without difficulty. Psychiatric this patient is able to make decisions and demonstrates good insight into disease process. Alert and Oriented x 3. pleasant and cooperative. Notes Upon inspection patient's wound again did not require any sharp debridement in any locations today he seems to be healing well except for the distal left lower extremity ulcer which is not doing quite as well at this point. He does not have any significant slough buildup he does have collagen has been using on this area we will see how things look next week but he continues to tell me about how strange today is that this seems to have reappeared in the exact same location again I am a little concerned about a skin cancer simply due to the fact that he really does not seem to have significant venous stasis that would lead me to believe he would have a reopening of a venous stasis ulcer. Nonetheless it could be vasculitis or something of the sort but it also could be something that is more characteristic of skin cancer. Electronic Signature(s) Signed: 08/22/2019 3:54:48 PM By: Lenda Kelp PA-C Entered By: Lenda Kelp on 08/22/2019 15:54:47 Tony Long  ([id number]) -------------------------------------------------------------------------------- Physician Orders Details Patient Name: Tony Long Date of Service: 08/22/2019 2:45 PM Medical Record Number: [id number] Patient Account Number: [id number] Date of Birth/Sex: 10/08/1975 (44 y.o. M) Treating RN: Rodell Perna Primary Care Provider: Raliegh Ip Other Clinician: Referring Provider: Raliegh Ip Treating Provider/Extender: Linwood Dibbles, Evelene Roussin Weeks in Treatment: 1 Verbal / Phone Orders: No Diagnosis Coding ICD-10 Coding Code Description E11.622 Type 2 diabetes mellitus with other skin ulcer L97.822 Non-pressure chronic ulcer of other part of left lower leg with fat layer exposed S51.801A Unspecified open wound of right forearm, initial encounter F11.20 Opioid dependence, uncomplicated I10 Essential (primary) hypertension B18.2 Chronic viral hepatitis C Wound Cleansing Wound #12 Left,Medial Lower Leg o Clean wound with Normal Saline. - in office o Cleanse wound with mild soap and water Wound #14 Left,Proximal,Medial Lower Leg o Clean wound with Normal Saline. - in office o Cleanse wound with mild soap and water Primary Wound Dressing Wound #12 Left,Medial Lower Leg o Silver Collagen Wound #14 Left,Proximal,Medial Lower Leg o Silver Collagen Secondary Dressing Wound #12 Left,Medial Lower Leg o [place] Wound #14 Left,Proximal,Medial Lower Leg o [place] Dressing Change Frequency Wound #12 Left,Medial Lower Leg o Change dressing every  other day. Wound #14 Left,Proximal,Medial Lower Leg o Change dressing every other day. Follow-up Appointments Wound #12 Left,Medial Lower Leg o Return Appointment in 1 week. Wound #14 Left,Proximal,Medial Lower Leg o Return Appointment in 1 week. Electronic Signature(s) Signed: 08/22/2019 4:40:14 PM By: Rodell PernaScott, Dajea Signed: 08/22/2019 5:29:33 PM By: Alfonse SpruceStone III, Jemarion Roycroft PA-C Hanlon, Tony Long  ([id number]) Entered By: Rodell PernaScott, Dajea on 08/22/2019 15:48:54 Tony Long, Tony Long ([id number]) -------------------------------------------------------------------------------- Problem List Details Patient Name: Tony Long, Tony Long Date of Service: 08/22/2019 2:45 PM Medical Record Number: [id number] Patient Account Number: [id number] Date of Birth/Sex: 04/09/1976 (44 y.o. M) Treating RN: Huel CoventryWoody, Kim Primary Care Provider: Raliegh IpSTROUD, NATALIE Other Clinician: Referring Provider: Raliegh IpSTROUD, NATALIE Treating Provider/Extender: Linwood DibblesSTONE III, Tanith Dagostino Weeks in Treatment: 1 Active Problems ICD-10 Evaluated Encounter Code Description Active Date Today Diagnosis E11.622 Type 2 diabetes mellitus with other skin ulcer 08/15/2019 No Yes L97.822 Non-pressure chronic ulcer of other part of left lower leg with fat layer 08/15/2019 No Yes exposed S51.801A Unspecified open wound of right forearm, initial encounter 08/15/2019 No Yes F11.20 Opioid dependence, uncomplicated 08/15/2019 No Yes I10 Essential (primary) hypertension 08/15/2019 No Yes B18.2 Chronic viral hepatitis C 08/15/2019 No Yes Inactive Problems Resolved Problems Electronic Signature(s) Signed: 08/22/2019 2:54:43 PM By: Lenda KelpStone III, Theophil Thivierge PA-C Entered By: Lenda KelpStone III, Keera Altidor on 08/22/2019 14:54:43 Tony Long, Tony Long ([id number]) -------------------------------------------------------------------------------- Progress Note Details Patient Name: Tony Long, Tony Long Date of Service: 08/22/2019 2:45 PM Medical Record Number: [id number] Patient Account Number: [id number] Date of Birth/Sex: 03/07/1976 (44 y.o. M) Treating RN: Huel CoventryWoody, Kim Primary Care Provider: Raliegh IpSTROUD, NATALIE Other Clinician: Referring Provider: Raliegh IpSTROUD, NATALIE Treating Provider/Extender: Linwood DibblesSTONE III, Yaziel  Weeks in Treatment: 1 Subjective Chief Complaint Information obtained from Patient Right forearm and Left LE Ulcers History of Present Illness (HPI) The following HPI elements were documented for the patient's  wound: Associated Signs and Symptoms: Patient does have a history of chronic viral hepatitis see, hypertension, and opioid dependence. 05/31/18 on evaluation today patient presents for initial evaluation or office for two issues that he's been having with openings in the lumbar spine extending into the lower thoracic region. Subsequently it appears that as best I can tell from his notes he likely had an epidural abscess which subsequently led to these open areas. He was seen by infectious disease, Rexene AlbertsStephanie Dixon, who referred him to us for management of the wounds. Apparently he has completed his course of IV antibiotic therapy at this point. Patient did have a technically limited examination of the cervical and thoracic spine by way of it and MRI. Unfortunately he was not able to tolerate the full length of the exam therefore only sagittal sections were obtained of the thoracic spine region. Fortunately there is no imaging findings to suggest spinal cord infarction. There was interval improvement in the epidural collection (epidural abscess) on this examination when compared to the prior examination which was 03/22/18. The date of this MRI was 03/30/18. Again there was no imaging performed of the lumbar spine at this time. No new disc guidance or facet arthritis noted. There is no evidence of osteomyelitis. Currently the patient does not have any significant pain which is good news. With that being said he is having some discomfort at this point mainly with packing of the wounds although I think packing is going to continue to be the treatment of choice. No fevers, chills, nausea, or vomiting noted at this time. 06/07/18 on evaluation today patient's wounds in the back region specifically the lumbar spine area appear to be doing very well at this time. In fact  the 12 o'clock total of the cephalad wound shows evidence of not being quite as wide open as what I previously noted. In fact there is gonna be no  chance of packing anything into this area at this time. The caudal wound actually shows signs of being about the same although again as I explained to the patient is gonna take some time for this area to heal. Readmission: 09/20/18 patient seen today for readmission concerning issues that he has been having with his back I previously saw him for this towards the end of last year 2019. Subsequently he was lost to follow-up due to not returning to the clinic. Subsequently what brings them in today is actually a different issue with his right lower quadrant abdominal area/groin where he has what appears to be an abscess at this time that for the most part is resolving but he actually has eschar covering the surface of the wound currently. With that being said he states this has been painful although it's less painful now than it's been in the past. He did attempt to use a razor blade to actually remove some of this dark eschar prior to coming into the office today. With that being said he only did a very small portion and states he got too scared to continue. Again I believe it's best for him not to do this but rather allow [place] to do this if need be. Nonetheless I do think the escort coming off of benefit him he completely agrees. Fortunately there's no signs of active infection at this time. He does have some integration noted around the wound region. No fevers, chills, nausea, or vomiting noted at this time. Patient tells me that he currently is not utilizing any IV recreational drugs at this point he states he's doing very well in that regard. Readmission: 01/06/19 patient presents today for reevaluation in our clinic. I previously seen him four wounds on his back region in particular. Nonetheless he actually has wounds of this time in regard to his bilateral lower extremities which unfortunately have been giving him some issues for several weeks. Fortunately there's no signs of significant infection  although there does seem to be some irritation. He states this will start out as red patches that become somewhat tender and then open up into ulcers. He has not gotten any answers as to what may be causing this. He does have a history of opioid abuse but tells me he has not used the recreational drugs in the past nine months. He does have hypertension in multiple lower extremity leg ulcers and varying degrees of severity and tissue exposure noted of the bilateral lower extremities. He also has diabetes. There is no history of trauma he does not know where these came from. Readmission: 08/15/2019 patient presents today for reevaluation here in the clinic concerning issues that he has been having with a wound on his left lower extremity as well as his right forearm. Fortunately neither appears to be extremely severe which is good news the left leg area is a region that he has had multiple times in the past that we have been helping take care of him. Again this seems to be more due to scar tissue and breakdown he does not really have significant edema but I still wonder if this could be more venous in nature potentially just due to the recurrence. Nonetheless he tells me that the forearm happened while he was working on a car that has not been running  30 years. There is nothing hot I suspect a chemical burn to this area where he might of gotten something on and that he did not realize that caused an issue here. This is very superficial. 08/22/2019 upon evaluation today patient appears to be doing well in general in regard to his arm ulcer as well as his proximal lower leg ulcer. Both are healing quite nicely and the arm in fact appears to be completely healed. Unfortunately the wound around his left medial lower leg distally is not doing Walcott, Tony Junes ([id number]) as well it does not look terrible but at the same time there is tendon exposed is definitely a little bit deeper. He finds it strange that  this is something that healed about a year ago and it was in the exact same location and pretty much looked identical to him. And then reopen. I am beginning to be somewhat suspicious about the possibility of a skin cancer. Obviously if it is there is something that we need to do with that sooner rather than later to not let this get out of control. Once I mention this the patient became somewhat reluctant to want to proceed with the biopsy. He states in general "I am the kind of person that would prefer not to know if it is cancer". I explained further that again this is something that if it is a skin cancer can be taken care of now if you wait for to spread it can be much more significant and even life-threatening. Objective Constitutional Well-nourished and well-hydrated in no acute distress. Vitals Time Taken: 3:05 PM, Height: 71 in, Weight: 153 lbs, BMI: 21.3, Temperature: 98.4 F, Pulse: 89 bpm, Respiratory Rate: 16 breaths/min, Blood Pressure: 105/62 mmHg. Respiratory normal breathing without difficulty. Psychiatric this patient is able to make decisions and demonstrates good insight into disease process. Alert and Oriented x 3. pleasant and cooperative. General Notes: Upon inspection patient's wound again did not require any sharp debridement in any locations today he seems to be healing well except for the distal left lower extremity ulcer which is not doing quite as well at this point. He does not have any significant slough buildup he does have collagen has been using on this area we will see how things look next week but he continues to tell me about how strange today is that this seems to have reappeared in the exact same location again I am a little concerned about a skin cancer simply due to the fact that he really does not seem to have significant venous stasis that would lead me to believe he would have a reopening of a venous stasis ulcer. Nonetheless it could be vasculitis or  something of the sort but it also could be something that is more characteristic of skin cancer. Integumentary (Hair, Skin) Wound #12 status is Open. Original cause of wound was Gradually Appeared. The wound is located on the Left,Medial Lower Leg. The wound measures 3.9cm length x 3.2cm width x 0.2cm depth; 9.802cm^2 area and 1.96cm^3 volume. There is Fat Layer (Subcutaneous Tissue) Exposed exposed. There is no tunneling or undermining noted. There is a medium amount of sanguinous drainage noted. The wound margin is flat and intact. There is small (1-33%) pink granulation within the wound bed. There is a large (67-100%) amount of necrotic tissue within the wound bed including Eschar and Adherent Slough. Wound #13 status is Healed - Epithelialized. Original cause of wound was Blister. The wound is located on the Right Forearm. The  wound measures 0cm length x 0cm width x 0cm depth; 0cm^2 area and 0cm^3 volume. The wound is limited to skin breakdown. There is no tunneling or undermining noted. There is a none present amount of drainage noted. The wound margin is flat and intact. There is no granulation within the wound bed. There is no necrotic tissue within the wound bed. Wound #14 status is Open. Original cause of wound was Gradually Appeared. The wound is located on the Left,Proximal,Medial Lower Leg. The wound measures 0.6cm length x 0.4cm width x 0.1cm depth; 0.188cm^2 area and 0.019cm^3 volume. There is Fat Layer (Subcutaneous Tissue) Exposed exposed. There is no tunneling or undermining noted. There is a medium amount of drainage noted. There is medium (34-66%) red granulation within the wound bed. There is a medium (34-66%) amount of necrotic tissue within the wound bed including Adherent Slough. Assessment Active Problems ICD-10 Type 2 diabetes mellitus with other skin ulcer Non-pressure chronic ulcer of other part of left lower leg with fat layer exposed Unspecified open wound of right  forearm, initial encounter Opioid dependence, uncomplicated Essential (primary) hypertension Chronic viral hepatitis C Tony Long, Tony Long ([id number]) Plan Wound Cleansing: Wound #12 Left,Medial Lower Leg: Clean wound with Normal Saline. - in office Cleanse wound with mild soap and water Wound #14 Left,Proximal,Medial Lower Leg: Clean wound with Normal Saline. - in office Cleanse wound with mild soap and water Primary Wound Dressing: Wound #12 Left,Medial Lower Leg: Silver Collagen Wound #14 Left,Proximal,Medial Lower Leg: Silver Collagen Secondary Dressing: Wound #12 Left,Medial Lower Leg: [place] Wound #14 Left,Proximal,Medial Lower Leg: [place] Dressing Change Frequency: Wound #12 Left,Medial Lower Leg: Change dressing every other day. Wound #14 Left,Proximal,Medial Lower Leg: Change dressing every other day. Follow-up Appointments: Wound #12 Left,Medial Lower Leg: Return Appointment in 1 week. Wound #14 Left,Proximal,Medial Lower Leg: Return Appointment in 1 week. 1. I did offer a biopsy today in order to evaluate for the possibility of skin cancer so that we could potentially identify what was going on and subsequently make the appropriate referrals if necessary. With that being said when I mention this the patient became somewhat more standoffish on looking into this further he states that if it is he would prefer not to know. I explained further that this is something that if we knew could be taken care of if we do not know and we let it just run its course can be a much bigger issue. He voiced an understanding but stated he would wait and think about it for next week. 2. We will get a continue with collagen to both wound locations remaining at this point I think that is appropriate. 3. I am also going to recommend that we continue with changing the dressing several times a week in order to keep it clean and dry hopefully this will make some improvement but if  not I think we really do need to strongly consider a biopsy at that point. We will see patient back for reevaluation in 1 week here in the clinic. If anything worsens or changes patient will contact our office for additional recommendations. Electronic Signature(s) Signed: 08/22/2019 3:56:56 PM By: Lenda Kelp PA-C Entered By: Lenda Kelp on 08/22/2019 15:56:55 Tony Long ([id number]) -------------------------------------------------------------------------------- SuperBill Details Patient Name: Tony Long Date of Service: 08/22/2019 Medical Record Number: [id number] Patient Account Number: [id number] Date of Birth/Sex: 10-12-1975 (45 y.o. M) Treating RN: Huel Coventry Primary Care Provider: Raliegh Ip Other Clinician: Referring Provider: Raliegh Ip Treating Provider/Extender: Linwood Dibbles, Carrine Kroboth  Weeks in Treatment: 1 Diagnosis Coding ICD-10 Codes Code Description E11.622 Type 2 diabetes mellitus with other skin ulcer L97.822 Non-pressure chronic ulcer of other part of left lower leg with fat layer exposed S51.801A Unspecified open wound of right forearm, initial encounter F11.20 Opioid dependence, uncomplicated I10 Essential (primary) hypertension B18.2 Chronic viral hepatitis C Facility Procedures CPT4 Code: 22482500 Description: 99213 - WOUND CARE VISIT-LEV 3 EST PT Modifier: Quantity: 1 Physician Procedures CPT4 Code: 3704888 Description: 99213 - WC PHYS LEVEL 3 - EST PT Modifier: Quantity: 1 CPT4 Code: Description: ICD-10 Diagnosis Description E11.622 Type 2 diabetes mellitus with other skin ulcer L97.822 Non-pressure chronic ulcer of other part of left lower leg with fat layer e S51.801A Unspecified open wound of right forearm, initial encounter  F11.20 Opioid dependence, uncomplicated Modifier: xposed Quantity: Electronic Signature(s) Signed: 08/22/2019 3:57:31 PM By: Lenda Kelp PA-C Entered By: Lenda Kelp on 08/22/2019 15:57:30

## 2019-08-29 ENCOUNTER — Other Ambulatory Visit: Payer: Self-pay

## 2019-08-29 ENCOUNTER — Encounter: Payer: Medicaid Other | Attending: Physician Assistant | Admitting: Physician Assistant

## 2019-08-29 DIAGNOSIS — F17200 Nicotine dependence, unspecified, uncomplicated: Secondary | ICD-10-CM | POA: Diagnosis not present

## 2019-08-29 DIAGNOSIS — I252 Old myocardial infarction: Secondary | ICD-10-CM | POA: Diagnosis not present

## 2019-08-29 DIAGNOSIS — Z8249 Family history of ischemic heart disease and other diseases of the circulatory system: Secondary | ICD-10-CM | POA: Insufficient documentation

## 2019-08-29 DIAGNOSIS — E1151 Type 2 diabetes mellitus with diabetic peripheral angiopathy without gangrene: Secondary | ICD-10-CM | POA: Insufficient documentation

## 2019-08-29 DIAGNOSIS — I10 Essential (primary) hypertension: Secondary | ICD-10-CM | POA: Diagnosis not present

## 2019-08-29 DIAGNOSIS — E11622 Type 2 diabetes mellitus with other skin ulcer: Secondary | ICD-10-CM | POA: Diagnosis not present

## 2019-08-29 DIAGNOSIS — Z809 Family history of malignant neoplasm, unspecified: Secondary | ICD-10-CM | POA: Diagnosis not present

## 2019-08-29 DIAGNOSIS — B182 Chronic viral hepatitis C: Secondary | ICD-10-CM | POA: Insufficient documentation

## 2019-08-29 DIAGNOSIS — L97822 Non-pressure chronic ulcer of other part of left lower leg with fat layer exposed: Secondary | ICD-10-CM | POA: Insufficient documentation

## 2019-08-29 DIAGNOSIS — S51801A Unspecified open wound of right forearm, initial encounter: Secondary | ICD-10-CM | POA: Insufficient documentation

## 2019-08-29 DIAGNOSIS — F1120 Opioid dependence, uncomplicated: Secondary | ICD-10-CM | POA: Insufficient documentation

## 2019-08-29 DIAGNOSIS — Z833 Family history of diabetes mellitus: Secondary | ICD-10-CM | POA: Diagnosis not present

## 2019-08-29 NOTE — Progress Notes (Signed)
VERNER, MCCRONE ([id number]) Visit Report for 08/29/2019 Arrival Information Details Patient Name: Tony Long, Tony Long Date of Service: 08/29/2019 2:45 PM Medical Record Number: [id number] Patient Account Number: [id number] Date of Birth/Sex: [birth year] (44 y.o. M) Treating RN: Primary Care Ryder Chesmore: Raliegh Ip Other Clinician: Referring Tavari Loadholt: Raliegh Ip Treating Madisun Hargrove/Extender: Linwood Dibbles, HOYT Weeks in Treatment: 2 Visit Information History Since Last Visit Added or deleted any medications: No Patient Arrived: Ambulatory Any new allergies or adverse reactions: No Arrival Time: 15:04 Had a fall or experienced change in No Accompanied By: self activities of daily living that may affect Transfer Assistance: None risk of falls: Patient Identification Verified: Yes Signs or symptoms of abuse/neglect since last visito No Secondary Verification Process Completed: Yes Hospitalized since last visit: No Implantable device outside of the clinic excluding No cellular tissue based products placed in the center since last visit: Has Dressing in Place as Prescribed: Yes Pain Present Now: No Electronic Signature(s) Signed: 08/29/2019 4:02:24 PM By: Dayton Martes RCP, RRT, CHT Entered By: Dayton Martes on 08/29/2019 15:05:48 Tony Long ([id number]) -------------------------------------------------------------------------------- Encounter Discharge Information Details Patient Name: Tony Long Date of Service: 08/29/2019 2:45 PM Medical Record Number: [id number] Patient Account Number: [id number] Date of Birth/Sex: 1976/01/11 (44 y.o. M) Treating RN: Rodell Perna Primary Care Erielle Gawronski: Raliegh Ip Other Clinician: Referring Tecumseh Yeagley: Raliegh Ip Treating Marqus Macphee/Extender: Linwood Dibbles, HOYT Weeks in Treatment: 2 Encounter Discharge Information Items Post Procedure Vitals Discharge Condition: Stable Temperature (F): 98.1 Ambulatory  Status: Ambulatory Pulse (bpm): 96 Discharge Destination: Home Respiratory Rate (breaths/min): 16 Transportation: Private Auto Blood Pressure (mmHg): 132/84 Accompanied By: self Schedule Follow-up Appointment: Yes Clinical Summary of Care: Electronic Signature(s) Signed: 08/29/2019 4:36:22 PM By: Rodell Perna Entered By: Rodell Perna on 08/29/2019 15:30:30 Tony Long ([id number]) -------------------------------------------------------------------------------- Lower Extremity Assessment Details Patient Name: Tony Long Date of Service: 08/29/2019 2:45 PM Medical Record Number: [id number] Patient Account Number: [id number] Date of Birth/Sex: 1975/10/20 (44 y.o. M) Treating RN: Curtis Sites Primary Care Laqueena Hinchey: Raliegh Ip Other Clinician: Referring Kiaan Overholser: Raliegh Ip Treating Astor Gentle/Extender: STONE III, HOYT Weeks in Treatment: 2 Edema Assessment Assessed: [Left: No] [Right: No] Edema: [Left: Ye] [Right: s] Vascular Assessment Pulses: Dorsalis Pedis Palpable: [Left:Yes] Electronic Signature(s) Signed: 08/29/2019 4:41:53 PM By: Curtis Sites Entered By: Curtis Sites on 08/29/2019 15:11:27 Tony Long ([id number]) -------------------------------------------------------------------------------- Multi Wound Chart Details Patient Name: Tony Long Date of Service: 08/29/2019 2:45 PM Medical Record Number: [id number] Patient Account Number: [id number] Date of Birth/Sex: Nov 07, 1975 (44 y.o. M) Treating RN: Rodell Perna Primary Care Naiara Lombardozzi: Raliegh Ip Other Clinician: Referring Klarissa Mcilvain: Raliegh Ip Treating Kapono Luhn/Extender: STONE III, HOYT Weeks in Treatment: 2 Vital Signs Height(in): 71 Pulse(bpm): 96 Weight(lbs): 153 Blood Pressure(mmHg): 132/84 Body Mass Index(BMI): 21 Temperature(F): 98.1 Respiratory Rate(breaths/min): 16 Photos: [N/A:N/A] Wound Location: Left Lower Leg - Medial Left Lower Leg - Medial, Proximal  N/A Wounding Event: Gradually Appeared Gradually Appeared N/A Primary Etiology: Diabetic Wound/Ulcer of the Lower Diabetic Wound/Ulcer of the Lower N/A Extremity Extremity Comorbid History: Type II Diabetes Type II Diabetes N/A Date Acquired: 08/08/2019 08/01/2019 N/A Weeks of Treatment: 2 2 N/A Wound Status: Open Open N/A Measurements L x W x D (cm) 3.8x3.3x0.3 0.5x0.4x0.1 N/A Area (cm) : 9.849 0.157 N/A Volume (cm) : 2.955 0.016 N/A % Reduction in Area: -280.00% 42.90% N/A % Reduction in Volume: -470.50% 70.90% N/A Classification: Grade 1 Grade 2 N/A Exudate Amount: Medium Medium N/A Exudate Type: Sanguinous N/A N/A Exudate Color: red N/A N/A Wound Margin: Flat and Intact N/A N/A Granulation Amount: Small (1-33%) Medium (  34-66%) N/A Granulation Quality: Pink Red N/A Necrotic Amount: Large (67-100%) Medium (34-66%) N/A Necrotic Tissue: Eschar, Adherent Slough Adherent Slough N/A Exposed Structures: Fat Layer (Subcutaneous Tissue) Fat Layer (Subcutaneous Tissue) N/A Exposed: Yes Exposed: Yes Fascia: No Fascia: No Tendon: No Tendon: No Muscle: No Muscle: No Joint: No Joint: No Bone: No Bone: No Epithelialization: Small (1-33%) Small (1-33%) N/A Treatment Notes Electronic Signature(s) Signed: 08/29/2019 4:36:22 PM By: Rodell Perna Entered By: Rodell Perna on 08/29/2019 15:24:52 Tony Long ([id number] Long ([id number]) -------------------------------------------------------------------------------- Multi-Disciplinary Care Plan Details Patient Name: Tony Long Date of Service: 08/29/2019 2:45 PM Medical Record Number: [id number] Patient Account Number: [id number] Date of Birth/Sex: 10-04-1975 (44 y.o. M) Treating RN: Rodell Perna Primary Care Nicoli Nardozzi: Raliegh Ip Other Clinician: Referring Ryenne Lynam: Raliegh Ip Treating Jefferie Holston/Extender: Linwood Dibbles, HOYT Weeks in Treatment: 2 Active Inactive Orientation to the Wound Care Program Nursing  Diagnoses: Knowledge deficit related to the wound healing center program Goals: Patient/caregiver will verbalize understanding of the Wound Healing Center Program Date Initiated: 08/15/2019 Target Resolution Date: 09/09/2019 Goal Status: Active Interventions: Provide education on orientation to the wound center Notes: Wound/Skin Impairment Nursing Diagnoses: Impaired tissue integrity Goals: Ulcer/skin breakdown will have a volume reduction of 30% by week 4 Date Initiated: 08/15/2019 Target Resolution Date: 09/09/2019 Goal Status: Active Interventions: Assess ulceration(s) every visit Provide education on smoking Notes: Electronic Signature(s) Signed: 08/29/2019 4:36:22 PM By: Rodell Perna Entered By: Rodell Perna on 08/29/2019 15:24:11 Tony Long ([id number]) -------------------------------------------------------------------------------- Pain Assessment Details Patient Name: Tony Long Date of Service: 08/29/2019 2:45 PM Medical Record Number: [id number] Patient Account Number: [id number] Date of Birth/Sex: 03/31/1976 (44 y.o. M) Treating RN: Curtis Sites Primary Care Nayely Dingus: Raliegh Ip Other Clinician: Referring Fynn Adel: Raliegh Ip Treating Asiya Cutbirth/Extender: Linwood Dibbles, HOYT Weeks in Treatment: 2 Active Problems Location of Pain Severity and Description of Pain Patient Has Paino No Site Locations Pain Management and Medication Current Pain Management: Electronic Signature(s) Signed: 08/29/2019 4:41:53 PM By: Curtis Sites Entered By: Curtis Sites on 08/29/2019 15:08:42 Tony Long ([id number]) -------------------------------------------------------------------------------- Patient/Caregiver Education Details Patient Name: Tony Long Date of Service: 08/29/2019 2:45 PM Medical Record Number: [id number] Patient Account Number: [id number] Date of Birth/Gender: 11/23/1975 (44 y.o. M) Treating RN: Rodell Perna Primary Care Physician: Raliegh Ip Other Clinician: Referring Physician: Raliegh Ip Treating Physician/Extender: Skeet Simmer in Treatment: 2 Education Assessment Education Provided To: Patient Education Topics Provided Wound/Skin Impairment: Handouts: Caring for Your Ulcer Methods: Demonstration, Explain/Verbal Responses: State content correctly Electronic Signature(s) Signed: 08/29/2019 4:36:22 PM By: Rodell Perna Entered By: Rodell Perna on 08/29/2019 15:29:47 Tony Long ([id number]) -------------------------------------------------------------------------------- Wound Assessment Details Patient Name: Tony Long Date of Service: 08/29/2019 2:45 PM Medical Record Number: [id number] Patient Account Number: [id number] Date of Birth/Sex: 1975/11/01 (44 y.o. M) Treating RN: Curtis Sites Primary Care Ayano Douthitt: Raliegh Ip Other Clinician: Referring Berneita Sanagustin: Raliegh Ip Treating Sharhonda Atwood/Extender: STONE III, HOYT Weeks in Treatment: 2 Wound Status Wound Number: 12 Primary Etiology: Diabetic Wound/Ulcer of the Lower Extremity Wound Location: Left Lower Leg - Medial Wound Status: Open Wounding Event: Gradually Appeared Comorbid History: Type II Diabetes Date Acquired: 08/08/2019 Weeks Of Treatment: 2 Clustered Wound: No Photos Wound Measurements Length: (cm) 3.8 % Redu Width: (cm) 3.3 % Redu Depth: (cm) 0.3 Epithe Area: (cm) 9.849 [place] Volume: (cm) 2.955 Under ction in Area: -280% ction in Volume: -470.5% lialization: Small (1-33%) ling: No mining: No Wound Description Classification: Grade 1 Foul O Wound Margin: Flat and Intact Slough Exudate Amount: Medium Exudate Type: Sanguinous Exudate Color: red dor  After Cleansing: No /Fibrino Yes Wound Bed Granulation Amount: Small (1-33%) Exposed Structure Granulation Quality: Pink Fascia Exposed: No Necrotic Amount: Large (67-100%) Fat Layer (Subcutaneous Tissue) Exposed: Yes Necrotic Quality: Eschar, Adherent  Slough Tendon Exposed: No Muscle Exposed: No Joint Exposed: No Bone Exposed: No Treatment Notes Wound #12 (Left, Medial Lower Leg) Notes prisma, Architectural technologist Signature(s) Signed: 08/29/2019 4:41:53 PM By: Orlinda Blalock, Erlene Quan ([id number]) Entered By: Montey Hora on 08/29/2019 15:14:21 Tony Long ([id number]) -------------------------------------------------------------------------------- Wound Assessment Details Patient Name: Tony Long Date of Service: 08/29/2019 2:45 PM Medical Record Number: [id number] Patient Account Number: [id number] Date of Birth/Sex: 1975/07/20 (44 y.o. M) Treating RN: Montey Hora Primary Care Usha Slager: Kathe Becton Other Clinician: Referring Allyne Hebert: Kathe Becton Treating Gerrit Rafalski/Extender: STONE III, HOYT Weeks in Treatment: 2 Wound Status Wound Number: 14 Primary Etiology: Diabetic Wound/Ulcer of the Lower Extremity Wound Location: Left Lower Leg - Medial, Proximal Wound Status: Open Wounding Event: Gradually Appeared Comorbid History: Type II Diabetes Date Acquired: 08/01/2019 Weeks Of Treatment: 2 Clustered Wound: No Photos Wound Measurements Length: (cm) 0.5 Width: (cm) 0.4 Depth: (cm) 0.1 Area: (cm) 0.157 Volume: (cm) 0.016 % Reduction in Area: 42.9% % Reduction in Volume: 70.9% Epithelialization: Small (1-33%) Tunneling: No Undermining: No Wound Description Classification: Grade 2 Exudate Amount: Medium Foul Odor After Cleansing: No Slough/Fibrino Yes Wound Bed Granulation Amount: Medium (34-66%) Exposed Structure Granulation Quality: Red Fascia Exposed: No Necrotic Amount: Medium (34-66%) Fat Layer (Subcutaneous Tissue) Exposed: Yes Necrotic Quality: Adherent Slough Tendon Exposed: No Muscle Exposed: No Joint Exposed: No Bone Exposed: No Treatment Notes Wound #14 (Left, Proximal, Medial Lower Leg) Notes prisma, Architectural technologist Signature(s) Signed: 08/29/2019 4:41:53 PM  By: Montey Hora Entered By: Montey Hora on 08/29/2019 15:14:44 Tony Long ([id number] Long ([id number]) -------------------------------------------------------------------------------- Vitals Details Patient Name: Tony Long Date of Service: 08/29/2019 2:45 PM Medical Record Number: [id number] Patient Account Number: [id number] Date of Birth/Sex: 04/16/1976 (44 y.o. M) Treating RN: Primary Care Latasha Buczkowski: Kathe Becton Other Clinician: Referring Trayden Brandy: Kathe Becton Treating Lavren Lewan/Extender: STONE III, HOYT Weeks in Treatment: 2 Vital Signs Time Taken: 15:00 Temperature (F): 98.1 Height (in): 71 Pulse (bpm): 96 Weight (lbs): 153 Respiratory Rate (breaths/min): 16 Body Mass Index (BMI): 21.3 Blood Pressure (mmHg): 132/84 Reference Range: 80 - 120 mg / dl Electronic Signature(s) Signed: 08/29/2019 4:02:24 PM By: Lorine Bears RCP, RRT, CHT Entered By: Lorine Bears on 08/29/2019 15:06:32

## 2019-08-29 NOTE — Progress Notes (Addendum)
DAYSHAWN, IRIZARRY ([id number]) Visit Report for 08/29/2019 Chief Complaint Document Details Patient Name: RORAN, WEGNER Date of Service: 08/29/2019 2:45 PM Medical Record Number: [id number] Patient Account Number: [id number] Date of Birth/Sex: 02-02-1976 (44 y.o. M) Treating RN: Primary Care Provider: Raliegh Ip Other Clinician: Referring Provider: Raliegh Ip Treating Provider/Extender: Linwood Dibbles, Cheick Suhr Weeks in Treatment: 2 Information Obtained from: Patient Chief Complaint Right forearm and Left LE Ulcers Electronic Signature(s) Signed: 08/29/2019 3:18:31 PM By: Lenda Kelp PA-C Entered By: Lenda Kelp on 08/29/2019 15:18:31 [name] ([id number]) -------------------------------------------------------------------------------- Debridement Details Patient Name: [name] Date of Service: 08/29/2019 2:45 PM Medical Record Number: [id number] Patient Account Number: [id number] Date of Birth/Sex: September 30, 1975 (43 y.o. M) Treating RN: Rodell Perna Primary Care Provider: Raliegh Ip Other Clinician: Referring Provider: Raliegh Ip Treating Provider/Extender: Linwood Dibbles, Garland Smouse Weeks in Treatment: 2 Debridement Performed for Wound #12 Left,Medial Lower Leg Assessment: Performed By: Physician STONE III, Joyclyn Plazola E., PA-C Debridement Type: Debridement Severity of Tissue Pre Debridement: Fat layer exposed Level of Consciousness (Pre- Awake and Alert procedure): Pre-procedure Verification/Time Out Yes - 15:24 Taken: Start Time: 15:25 Pain Control: Lidocaine Total Area Debrided (L x W): 3.8 (cm) x 3.3 (cm) = 12.54 (cm) Tissue and other material debrided: Viable, Non-Viable, Slough, Subcutaneous, Slough Level: Skin/Subcutaneous Tissue Debridement Description: Excisional Instrument: Curette Bleeding: Minimum Hemostasis Achieved: Pressure End Time: 15:26 Response to Treatment: Procedure was tolerated well Level of Consciousness (Post- Awake and  Alert procedure): Post Debridement Measurements of Total Wound Length: (cm) 3.8 Width: (cm) 3.3 Depth: (cm) 0.3 Volume: (cm) 2.955 Character of Wound/Ulcer Post Debridement: Stable Severity of Tissue Post Debridement: Fat layer exposed Post Procedure Diagnosis Same as Pre-procedure Electronic Signature(s) Signed: 08/29/2019 4:36:22 PM By: Rodell Perna Signed: 08/29/2019 4:51:53 PM By: Lenda Kelp PA-C Entered By: Rodell Perna on 08/29/2019 15:26:21 [name] ([id number]) -------------------------------------------------------------------------------- HPI Details Patient Name: [name] Date of Service: 08/29/2019 2:45 PM Medical Record Number: [id number] Patient Account Number: [id number] Date of Birth/Sex: 1975-09-28 (44 y.o. M) Treating RN: Primary Care Provider: Raliegh Ip Other Clinician: Referring Provider: Raliegh Ip Treating Provider/Extender: STONE III, Shantese Raven Weeks in Treatment: 2 History of Present Illness Associated Signs and Symptoms: Patient does have a history of chronic viral hepatitis see, hypertension, and opioid dependence. HPI Description: 05/31/18 on evaluation today patient presents for initial evaluation or office for two issues that he's been having with openings in the lumbar spine extending into the lower thoracic region. Subsequently it appears that as best I can tell from his notes he likely had an epidural abscess which subsequently led to these open areas. He was seen by infectious disease, Rexene Alberts, who referred him to [place] for management of the wounds. Apparently he has completed his course of IV antibiotic therapy at this point. Patient did have a technically limited examination of the cervical and thoracic spine by way of it and MRI. Unfortunately he was not able to tolerate the full length of the exam therefore only sagittal sections were obtained of the thoracic spine region. Fortunately there is no imaging findings to suggest  spinal cord infarction. There was interval improvement in the epidural collection (epidural abscess) on this examination when compared to the prior examination which was 03/22/18. The date of this MRI was 03/30/18. Again there was no imaging performed of the lumbar spine at this time. No new disc guidance or facet arthritis noted. There is no evidence of osteomyelitis. Currently the patient does not have any significant pain which is good news. With  that being said he is having some discomfort at this point mainly with packing of the wounds although I think packing is going to continue to be the treatment of choice. No fevers, chills, nausea, or vomiting noted at this time. 06/07/18 on evaluation today patient's wounds in the back region specifically the lumbar spine area appear to be doing very well at this time. In fact the 12 o'clock total of the cephalad wound shows evidence of not being quite as wide open as what I previously noted. In fact there is gonna be no chance of packing anything into this area at this time. The caudal wound actually shows signs of being about the same although again as I explained to the patient is gonna take some time for this area to heal. Readmission: 09/20/18 patient seen today for readmission concerning issues that he has been having with his back I previously saw him for this towards the end of last year 2019. Subsequently he was lost to follow-up due to not returning to the clinic. Subsequently what brings them in today is actually a different issue with his right lower quadrant abdominal area/groin where he has what appears to be an abscess at this time that for the most part is resolving but he actually has eschar covering the surface of the wound currently. With that being said he states this has been painful although it's less painful now than it's been in the past. He did attempt to use a razor blade to actually remove some of this dark eschar prior to coming  into the office today. With that being said he only did a very small portion and states he got too scared to continue. Again I believe it's best for him not to do this but rather allow [place] to do this if need be. Nonetheless I do think the escort coming off of benefit him he completely agrees. Fortunately there's no signs of active infection at this time. He does have some integration noted around the wound region. No fevers, chills, nausea, or vomiting noted at this time. Patient tells me that he currently is not utilizing any IV recreational drugs at this point he states he's doing very well in that regard. Readmission: 01/06/19 patient presents today for reevaluation in our clinic. I previously seen him four wounds on his back region in particular. Nonetheless he actually has wounds of this time in regard to his bilateral lower extremities which unfortunately have been giving him some issues for several weeks. Fortunately there's no signs of significant infection although there does seem to be some irritation. He states this will start out as red patches that become somewhat tender and then open up into ulcers. He has not gotten any answers as to what may be causing this. He does have a history of opioid abuse but tells me he has not used the recreational drugs in the past nine months. He does have hypertension in multiple lower extremity leg ulcers and varying degrees of severity and tissue exposure noted of the bilateral lower extremities. He also has diabetes. There is no history of trauma he does not know where these came from. Readmission: 08/15/2019 patient presents today for reevaluation here in the clinic concerning issues that he has been having with a wound on his left lower extremity as well as his right forearm. Fortunately neither appears to be extremely severe which is good news the left leg area is a region that he has had multiple times in the past  that we have been helping take care of  him. Again this seems to be more due to scar tissue and breakdown he does not really have significant edema but I still wonder if this could be more venous in nature potentially just due to the recurrence. Nonetheless he tells me that the forearm happened while he was working on a car that has not been running 30 years. There is nothing hot I suspect a chemical burn to this area where he might of gotten something on and that he did not realize that caused an issue here. This is very superficial. 08/22/2019 upon evaluation today patient appears to be doing well in general in regard to his arm ulcer as well as his proximal lower leg ulcer. Both are healing quite nicely and the arm in fact appears to be completely healed. Unfortunately the wound around his left medial lower leg distally is not doing as well it does not look terrible but at the same time there is tendon exposed is definitely a little bit deeper. He finds it strange that this is something that healed about a year ago and it was in the exact same location and pretty much looked identical to him. And then reopen. I am beginning to be somewhat suspicious about the possibility of a skin cancer. Obviously if it is there is something that we need to do with that sooner rather than later to not let this get out of control. Once I mention this the patient became somewhat reluctant to want to proceed with the biopsy. He states in general "I am the kind of person that would prefer not to know if it is cancer". I explained further that again this is something that if it is a skin cancer can be taken care of now if you wait for to spread it can be much more significant and even life-threatening. CHRISTOPHER, GLASSCOCK ([id number]) 08/29/2019 upon evaluation today patient appears to be doing a little better in regard to his wound at this point which is good news on the leg. I feel like he shown some signs of improvement. We have discussed the possibility of  biopsy for the site if things do not seem to be getting better but right now I do see a little bit of improvement compared to where we were previous. Fortunately there is no signs of active infection which is good news. He still wants to hold off on the biopsy I do not see anything so concerning that I think we need to rush down that road though I think we may Want to at least consider this in the future if things stall out. Electronic Signature(s) Signed: 08/29/2019 3:40:35 PM By: Lenda Kelp PA-C Entered By: Lenda Kelp on 08/29/2019 15:40:35 [name] ([id number]) -------------------------------------------------------------------------------- Physical Exam Details Patient Name: [name] Date of Service: 08/29/2019 2:45 PM Medical Record Number: [id number] Patient Account Number: [id number] Date of Birth/Sex: 05/28/1976 (44 y.o. M) Treating RN: Primary Care Provider: Raliegh Ip Other Clinician: Referring Provider: Raliegh Ip Treating Provider/Extender: STONE III, Corbet Hanley Weeks in Treatment: 2 Constitutional Well-nourished and well-hydrated in no acute distress. Respiratory normal breathing without difficulty. Psychiatric this patient is able to make decisions and demonstrates good insight into disease process. Alert and Oriented x 3. pleasant and cooperative. Notes Patient's wound bed currently showed signs of good granulation at the most part there was some slough noted over the wound distal and proximal in both actually did have to be cleaned up today. The  proximal wound really was quite minimal and this seems to be progressing quite nicely in my opinion I am very pleased in this regard no significant sharp debridement necessary at this point. Mechanically I was able to clean this up quite nicely. With regard to the distal wound this was a much larger area of debridement was required to clear away necrotic tissue and debris in order to help with appropriate  healing. The patient tolerated this today without complication. Electronic Signature(s) Signed: 08/29/2019 3:41:22 PM By: Worthy Keeler PA-C Entered By: Worthy Keeler on 08/29/2019 15:41:22 [name] ([id number]) -------------------------------------------------------------------------------- Physician Orders Details Patient Name: [name] Date of Service: 08/29/2019 2:45 PM Medical Record Number: [id number] Patient Account Number: [id number] Date of Birth/Sex: 24-Jan-1976 (44 y.o. M) Treating RN: Army Melia Primary Care Provider: Kathe Becton Other Clinician: Referring Provider: Kathe Becton Treating Provider/Extender: Melburn Hake, Innocence Schlotzhauer Weeks in Treatment: 2 Verbal / Phone Orders: No Diagnosis Coding ICD-10 Coding Code Description E11.622 Type 2 diabetes mellitus with other skin ulcer L97.822 Non-pressure chronic ulcer of other part of left lower leg with fat layer exposed S51.801A Unspecified open wound of right forearm, initial encounter F11.20 Opioid dependence, uncomplicated H85 Essential (primary) hypertension B18.2 Chronic viral hepatitis C Wound Cleansing Wound #12 Left,Medial Lower Leg o Clean wound with Normal Saline. - in office o Cleanse wound with mild soap and water Wound #14 Left,Proximal,Medial Lower Leg o Clean wound with Normal Saline. - in office o Cleanse wound with mild soap and water Primary Wound Dressing Wound #12 Left,Medial Lower Leg o Silver Collagen Wound #14 Left,Proximal,Medial Lower Leg o Silver Collagen Secondary Dressing Wound #12 Left,Medial Lower Leg o [place] Wound #14 Left,Proximal,Medial Lower Leg o [place] Dressing Change Frequency Wound #12 Left,Medial Lower Leg o Change dressing every other day. Wound #14 Left,Proximal,Medial Lower Leg o Change dressing every other day. Follow-up Appointments Wound #12 Left,Medial Lower Leg o Return Appointment in 1 week. Wound #14  Left,Proximal,Medial Lower Leg o Return Appointment in 1 week. Electronic Signature(s) Signed: 08/29/2019 4:36:22 PM By: Army Melia Signed: 08/29/2019 4:51:53 PM By: Vicente Masson, Erlene Quan ([id number]) Entered By: Army Melia on 08/29/2019 15:27:37 [name] ([id number]) -------------------------------------------------------------------------------- Problem List Details Patient Name: [name] Date of Service: 08/29/2019 2:45 PM Medical Record Number: [id number] Patient Account Number: [id number] Date of Birth/Sex: Jul 02, 1975 (44 y.o. M) Treating RN: Primary Care Provider: Kathe Becton Other Clinician: Referring Provider: Kathe Becton Treating Provider/Extender: Melburn Hake, Lavalle Skoda Weeks in Treatment: 2 Active Problems ICD-10 Evaluated Encounter Code Description Active Date Today Diagnosis E11.622 Type 2 diabetes mellitus with other skin ulcer 08/15/2019 No Yes L97.822 Non-pressure chronic ulcer of other part of left lower leg with fat layer 08/15/2019 No Yes exposed S51.801A Unspecified open wound of right forearm, initial encounter 08/15/2019 No Yes F11.20 Opioid dependence, uncomplicated 0/93/2671 No Yes I10 Essential (primary) hypertension 08/15/2019 No Yes B18.2 Chronic viral hepatitis C 08/15/2019 No Yes Inactive Problems Resolved Problems Electronic Signature(s) Signed: 08/29/2019 3:17:56 PM By: Worthy Keeler PA-C Entered By: Worthy Keeler on 08/29/2019 15:17:56 [name] ([id number]) -------------------------------------------------------------------------------- Progress Note Details Patient Name: [name] Date of Service: 08/29/2019 2:45 PM Medical Record Number: [id number] Patient Account Number: [id number] Date of Birth/Sex: 1976/05/20 (44 y.o. M) Treating RN: Primary Care Provider: Kathe Becton Other Clinician: Referring Provider: Kathe Becton Treating Provider/Extender: Melburn Hake, Broedy Osbourne Weeks in Treatment:  2 Subjective Chief Complaint Information obtained from Patient Right forearm and Left LE Ulcers History of Present Illness (HPI) The following  HPI elements were documented for the patient's wound: Associated Signs and Symptoms: Patient does have a history of chronic viral hepatitis see, hypertension, and opioid dependence. 05/31/18 on evaluation today patient presents for initial evaluation or office for two issues that he's been having with openings in the lumbar spine extending into the lower thoracic region. Subsequently it appears that as best I can tell from his notes he likely had an epidural abscess which subsequently led to these open areas. He was seen by infectious disease, Rexene AlbertsStephanie Dixon, who referred him to us for management of the wounds. Apparently he has completed his course of IV antibiotic therapy at this point. Patient did have a technically limited examination of the cervical and thoracic spine by way of it and MRI. Unfortunately he was not able to tolerate the full length of the exam therefore only sagittal sections were obtained of the thoracic spine region. Fortunately there is no imaging findings to suggest spinal cord infarction. There was interval improvement in the epidural collection (epidural abscess) on this examination when compared to the prior examination which was 03/22/18. The date of this MRI was 03/30/18. Again there was no imaging performed of the lumbar spine at this time. No new disc guidance or facet arthritis noted. There is no evidence of osteomyelitis. Currently the patient does not have any significant pain which is good news. With that being said he is having some discomfort at this point mainly with packing of the wounds although I think packing is going to continue to be the treatment of choice. No fevers, chills, nausea, or vomiting noted at this time. 06/07/18 on evaluation today patient's wounds in the back region specifically the lumbar spine area  appear to be doing very well at this time. In fact the 12 o'clock total of the cephalad wound shows evidence of not being quite as wide open as what I previously noted. In fact there is gonna be no chance of packing anything into this area at this time. The caudal wound actually shows signs of being about the same although again as I explained to the patient is gonna take some time for this area to heal. Readmission: 09/20/18 patient seen today for readmission concerning issues that he has been having with his back I previously saw him for this towards the end of last year 2019. Subsequently he was lost to follow-up due to not returning to the clinic. Subsequently what brings them in today is actually a different issue with his right lower quadrant abdominal area/groin where he has what appears to be an abscess at this time that for the most part is resolving but he actually has eschar covering the surface of the wound currently. With that being said he states this has been painful although it's less painful now than it's been in the past. He did attempt to use a razor blade to actually remove some of this dark eschar prior to coming into the office today. With that being said he only did a very small portion and states he got too scared to continue. Again I believe it's best for him not to do this but rather allow us to do this if need be. Nonetheless I do think the escort coming off of benefit him he completely agrees. Fortunately there's no signs of active infection at this time. He does have some integration noted around the wound region. No fevers, chills, nausea, or vomiting noted at this time. Patient tells me that he currently is not  utilizing any IV recreational drugs at this point he states he's doing very well in that regard. Readmission: 01/06/19 patient presents today for reevaluation in our clinic. I previously seen him four wounds on his back region in particular. Nonetheless he  actually has wounds of this time in regard to his bilateral lower extremities which unfortunately have been giving him some issues for several weeks. Fortunately there's no signs of significant infection although there does seem to be some irritation. He states this will start out as red patches that become somewhat tender and then open up into ulcers. He has not gotten any answers as to what may be causing this. He does have a history of opioid abuse but tells me he has not used the recreational drugs in the past nine months. He does have hypertension in multiple lower extremity leg ulcers and varying degrees of severity and tissue exposure noted of the bilateral lower extremities. He also has diabetes. There is no history of trauma he does not know where these came from. Readmission: 08/15/2019 patient presents today for reevaluation here in the clinic concerning issues that he has been having with a wound on his left lower extremity as well as his right forearm. Fortunately neither appears to be extremely severe which is good news the left leg area is a region that he has had multiple times in the past that we have been helping take care of him. Again this seems to be more due to scar tissue and breakdown he does not really have significant edema but I still wonder if this could be more venous in nature potentially just due to the recurrence. Nonetheless he tells me that the forearm happened while he was working on a car that has not been running 30 years. There is nothing hot I suspect a chemical burn to this area where he might of gotten something on and that he did not realize that caused an issue here. This is very superficial. 08/22/2019 upon evaluation today patient appears to be doing well in general in regard to his arm ulcer as well as his proximal lower leg ulcer. Both are healing quite nicely and the arm in fact appears to be completely healed. Unfortunately the wound around his left  medial lower leg distally is not doing [place], Apolinar Junes ([id number]) as well it does not look terrible but at the same time there is tendon exposed is definitely a little bit deeper. He finds it strange that this is something that healed about a year ago and it was in the exact same location and pretty much looked identical to him. And then reopen. I am beginning to be somewhat suspicious about the possibility of a skin cancer. Obviously if it is there is something that we need to do with that sooner rather than later to not let this get out of control. Once I mention this the patient became somewhat reluctant to want to proceed with the biopsy. He states in general "I am the kind of person that would prefer not to know if it is cancer". I explained further that again this is something that if it is a skin cancer can be taken care of now if you wait for to spread it can be much more significant and even life-threatening. 08/29/2019 upon evaluation today patient appears to be doing a little better in regard to his wound at this point which is good news on the leg. I feel like he shown some signs of improvement. We  have discussed the possibility of biopsy for the site if things do not seem to be getting better but right now I do see a little bit of improvement compared to where we were previous. Fortunately there is no signs of active infection which is good news. He still wants to hold off on the biopsy I do not see anything so concerning that I think we need to rush down that road though I think we may Want to at least consider this in the future if things stall out. Objective Constitutional Well-nourished and well-hydrated in no acute distress. Vitals Time Taken: 3:00 PM, Height: 71 in, Weight: 153 lbs, BMI: 21.3, Temperature: 98.1 F, Pulse: 96 bpm, Respiratory Rate: 16 breaths/min, Blood Pressure: 132/84 mmHg. Respiratory normal breathing without difficulty. Psychiatric this patient is able to  make decisions and demonstrates good insight into disease process. Alert and Oriented x 3. pleasant and cooperative. General Notes: Patient's wound bed currently showed signs of good granulation at the most part there was some slough noted over the wound distal and proximal in both actually did have to be cleaned up today. The proximal wound really was quite minimal and this seems to be progressing quite nicely in my opinion I am very pleased in this regard no significant sharp debridement necessary at this point. Mechanically I was able to clean this up quite nicely. With regard to the distal wound this was a much larger area of debridement was required to clear away necrotic tissue and debris in order to help with appropriate healing. The patient tolerated this today without complication. Integumentary (Hair, Skin) Wound #12 status is Open. Original cause of wound was Gradually Appeared. The wound is located on the Left,Medial Lower Leg. The wound measures 3.8cm length x 3.3cm width x 0.3cm depth; 9.849cm^2 area and 2.955cm^3 volume. There is Fat Layer (Subcutaneous Tissue) Exposed exposed. There is no tunneling or undermining noted. There is a medium amount of sanguinous drainage noted. The wound margin is flat and intact. There is small (1-33%) pink granulation within the wound bed. There is a large (67-100%) amount of necrotic tissue within the wound bed including Eschar and Adherent Slough. Wound #14 status is Open. Original cause of wound was Gradually Appeared. The wound is located on the Left,Proximal,Medial Lower Leg. The wound measures 0.5cm length x 0.4cm width x 0.1cm depth; 0.157cm^2 area and 0.016cm^3 volume. There is Fat Layer (Subcutaneous Tissue) Exposed exposed. There is no tunneling or undermining noted. There is a medium amount of drainage noted. There is medium (34-66%) red granulation within the wound bed. There is a medium (34-66%) amount of necrotic tissue within the wound bed  including Adherent Slough. Assessment Active Problems ICD-10 Type 2 diabetes mellitus with other skin ulcer Non-pressure chronic ulcer of other part of left lower leg with fat layer exposed Unspecified open wound of right forearm, initial encounter Opioid dependence, uncomplicated Essential (primary) hypertension Chronic viral hepatitis C JUDEA, RICHES ([id number]) Procedures Wound #12 Pre-procedure diagnosis of Wound #12 is a Diabetic Wound/Ulcer of the Lower Extremity located on the Left,Medial Lower Leg .Severity of Tissue Pre Debridement is: Fat layer exposed. There was a Excisional Skin/Subcutaneous Tissue Debridement with a total area of 12.54 sq cm performed by STONE III, Shondra Capps E., PA-C. With the following instrument(s): Curette to remove Viable and Non-Viable tissue/material. Material removed includes Subcutaneous Tissue and Slough and after achieving pain control using Lidocaine. A time out was conducted at 15:24, prior to the start of the procedure. A Minimum amount  of bleeding was controlled with Pressure. The procedure was tolerated well. Post Debridement Measurements: 3.8cm length x 3.3cm width x 0.3cm depth; 2.955cm^3 volume. Character of Wound/Ulcer Post Debridement is stable. Severity of Tissue Post Debridement is: Fat layer exposed. Post procedure Diagnosis Wound #12: Same as Pre-Procedure Plan Wound Cleansing: Wound #12 Left,Medial Lower Leg: Clean wound with Normal Saline. - in office Cleanse wound with mild soap and water Wound #14 Left,Proximal,Medial Lower Leg: Clean wound with Normal Saline. - in office Cleanse wound with mild soap and water Primary Wound Dressing: Wound #12 Left,Medial Lower Leg: Silver Collagen Wound #14 Left,Proximal,Medial Lower Leg: Silver Collagen Secondary Dressing: Wound #12 Left,Medial Lower Leg: [place] Wound #14 Left,Proximal,Medial Lower Leg: [place] Dressing Change Frequency: Wound #12 Left,Medial Lower  Leg: Change dressing every other day. Wound #14 Left,Proximal,Medial Lower Leg: Change dressing every other day. Follow-up Appointments: Wound #12 Left,Medial Lower Leg: Return Appointment in 1 week. Wound #14 Left,Proximal,Medial Lower Leg: Return Appointment in 1 week. 1. My suggestion at this time is can be that we go ahead and continue with the current wound care measures for the next week and the patient is in agreement with that plan. This includes utilizing the silver collagen. 2. I am in a suggest as well that we also continue to cover this with a [place] type dressing I think that is appropriate he does have some irritation from the Band-Aid he has been using he may want to try different brand to see if that will do better. 3. I am also going to suggest that he continue to monitor for any signs of infection though there is no evidence of anything going on at the moment. We will see patient back for reevaluation in 1 week here in the clinic. If anything worsens or changes patient will contact our office for additional recommendations. Electronic Signature(s) Signed: 08/29/2019 3:42:09 PM By: Alfonse SpruceStone III, Brenae Lasecki PA-C Nill, Apolinar JunesBRANDON ([id number]) Entered By: Lenda KelpStone III, Aleczander Fandino on 08/29/2019 15:42:08 Wynelle ClevelandKENNEDY, Kesley ([id number]) -------------------------------------------------------------------------------- SuperBill Details Patient Name: Wynelle ClevelandKENNEDY, Lysle Date of Service: 08/29/2019 Medical Record Number: [id number] Patient Account Number: [id number] Date of Birth/Sex: 06/29/1976 (44 y.o. M) Treating RN: Primary Care Provider: Raliegh IpSTROUD, NATALIE Other Clinician: Referring Provider: Raliegh IpSTROUD, NATALIE Treating Provider/Extender: Linwood DibblesSTONE III, Jerami Tammen Weeks in Treatment: 2 Diagnosis Coding ICD-10 Codes Code Description E11.622 Type 2 diabetes mellitus with other skin ulcer L97.822 Non-pressure chronic ulcer of other part of left lower leg with fat layer exposed S51.801A Unspecified open wound of  right forearm, initial encounter F11.20 Opioid dependence, uncomplicated I10 Essential (primary) hypertension B18.2 Chronic viral hepatitis C Facility Procedures CPT4 Code: [id number] Description: 11042 - DEB SUBQ TISSUE 20 SQ CM/< Modifier: Quantity: 1 CPT4 Code: Description: ICD-10 Diagnosis Description L97.822 Non-pressure chronic ulcer of other part of left lower leg with fat layer ex Modifier: posed Quantity: Physician Procedures CPT4 Code: [id number] Description: 11042 - WC PHYS SUBQ TISS 20 SQ CM Modifier: Quantity: 1 CPT4 Code: Description: ICD-10 Diagnosis Description L97.822 Non-pressure chronic ulcer of other part of left lower leg with fat layer ex Modifier: posed Quantity: Electronic Signature(s) Signed: 08/29/2019 3:42:40 PM By: Lenda KelpStone III, Imani Sherrin PA-C Entered By: Lenda KelpStone III, Clif Serio on 08/29/2019 15:42:39

## 2019-09-05 ENCOUNTER — Encounter: Payer: Medicaid Other | Admitting: Physician Assistant

## 2019-09-05 NOTE — Progress Notes (Signed)
OBE, AHLERS ([id number]) Visit Report for 09/05/2019 Chief Complaint Document Details Patient Name: LAMONTAE, RICARDO Date of Service: 09/05/2019 10:45 AM Medical Record Number: [id number] Patient Account Number: [id number] Date of Birth/Sex: July 18, 1975 (44 y.o. M) Treating RN: Primary Care Provider: Raliegh Ip Other Clinician: Referring Provider: Raliegh Ip Treating Provider/Extender: Linwood Dibbles, Dawn Kiper Weeks in Treatment: 3 Information Obtained from: Patient Chief Complaint Right forearm and Left LE Ulcers Electronic Signature(s) Signed: 09/05/2019 2:42:18 PM By: Lenda Kelp PA-C Entered By: Lenda Kelp on 09/05/2019 14:42:17 [name] ([id number]) -------------------------------------------------------------------------------- Problem List Details Patient Name: [name] Date of Service: 09/05/2019 10:45 AM Medical Record Number: [id number] Patient Account Number: [id number] Date of Birth/Sex: [birth year] (44 y.o. M) Treating RN: Primary Care Provider: Raliegh Ip Other Clinician: Referring Provider: Raliegh Ip Treating Provider/Extender: Linwood Dibbles, Janaki Exley Weeks in Treatment: 3 Active Problems ICD-10 Evaluated Encounter Code Description Active Date Today Diagnosis E11.622 Type 2 diabetes mellitus with other skin ulcer 08/15/2019 No Yes L97.822 Non-pressure chronic ulcer of other part of left lower leg with fat layer 08/15/2019 No Yes exposed S51.801A Unspecified open wound of right forearm, initial encounter 08/15/2019 No Yes F11.20 Opioid dependence, uncomplicated 08/15/2019 No Yes I10 Essential (primary) hypertension 08/15/2019 No Yes B18.2 Chronic viral hepatitis C 08/15/2019 No Yes Inactive Problems Resolved Problems Electronic Signature(s) Signed: 09/05/2019 2:42:12 PM By: Lenda Kelp PA-C Entered By: Lenda Kelp on 09/05/2019 14:42:11

## 2019-09-12 ENCOUNTER — Other Ambulatory Visit: Payer: Self-pay

## 2019-09-12 ENCOUNTER — Encounter: Payer: Medicaid Other | Admitting: Physician Assistant

## 2019-09-12 DIAGNOSIS — E11622 Type 2 diabetes mellitus with other skin ulcer: Secondary | ICD-10-CM | POA: Diagnosis not present

## 2019-09-12 NOTE — Progress Notes (Addendum)
Tony Long, Tony Long ([id number]) Visit Report for 09/12/2019 Chief Complaint Document Details Patient Name: Tony Long, Tony Long Date of Service: 09/12/2019 10:45 AM Medical Record Number: [id number] Patient Account Number: [id number] Date of Birth/Sex: 07/04/1975 (44 y.o. M) Treating RN: Primary Care Provider: Raliegh IpSTROUD, NATALIE Other Clinician: Referring Provider: Raliegh IpSTROUD, NATALIE Treating Provider/Extender: Linwood DibblesSTONE III, Makynzie Dobesh Weeks in Treatment: 4 Information Obtained from: Patient Chief Complaint Right forearm and Left LE Ulcers Electronic Signature(s) Signed: 09/12/2019 11:02:39 AM By: Lenda KelpStone III, Babygirl Trager PA-C Entered By: Lenda KelpStone III, Ia Leeb on 09/12/2019 11:02:39 Tony Long, Tony Long ([id number]) -------------------------------------------------------------------------------- Debridement Details Patient Name: Tony Long, Mohsin Date of Service: 09/12/2019 10:45 AM Medical Record Number: [id number] Patient Account Number: [id number] Date of Birth/Sex: 09/20/1975 (43 y.o. M) Treating RN: Rodell PernaScott, Dajea Primary Care Provider: Raliegh IpSTROUD, NATALIE Other Clinician: Referring Provider: Raliegh IpSTROUD, NATALIE Treating Provider/Extender: Linwood DibblesSTONE III, Chantelle Verdi Weeks in Treatment: 4 Debridement Performed for Wound #12 Left,Medial Lower Leg Assessment: Performed By: Physician STONE III, Pilar Westergaard E., PA-C Debridement Type: Debridement Severity of Tissue Pre Debridement: Fat layer exposed Level of Consciousness (Pre- Awake and Alert procedure): Pre-procedure Verification/Time Out Yes - 11:13 Taken: Start Time: 11:14 Pain Control: Lidocaine Total Area Debrided (L x W): 3.2 (cm) x 2 (cm) = 6.4 (cm) Tissue and other material debrided: Viable, Non-Viable, Slough, Subcutaneous, Slough Level: Skin/Subcutaneous Tissue Debridement Description: Excisional Instrument: Curette Bleeding: Minimum Hemostasis Achieved: Pressure End Time: 11:15 Response to Treatment: Procedure was tolerated well Level of Consciousness (Post- Awake and  Alert procedure): Post Debridement Measurements of Total Wound Length: (cm) 3.2 Width: (cm) 2 Depth: (cm) 0.2 Volume: (cm) 1.005 Character of Wound/Ulcer Post Debridement: Stable Severity of Tissue Post Debridement: Fat layer exposed Post Procedure Diagnosis Same as Pre-procedure Electronic Signature(s) Signed: 09/12/2019 1:35:00 PM By: Lenda KelpStone III, Thula Stewart PA-C Signed: 09/13/2019 10:13:19 AM By: Rodell PernaScott, Dajea Entered By: Rodell PernaScott, Dajea on 09/12/2019 11:14:45 Tony Long, Tony Long ([id number]) -------------------------------------------------------------------------------- HPI Details Patient Name: Tony Long, Tony Long Date of Service: 09/12/2019 10:45 AM Medical Record Number: [id number] Patient Account Number: [id number] Date of Birth/Sex: 11/03/1975 (44 y.o. M) Treating RN: Primary Care Provider: Raliegh IpSTROUD, NATALIE Other Clinician: Referring Provider: Raliegh IpSTROUD, NATALIE Treating Provider/Extender: STONE III, Terryl Molinelli Weeks in Treatment: 4 History of Present Illness Associated Signs and Symptoms: Patient does have a history of chronic viral hepatitis see, hypertension, and opioid dependence. HPI Description: 05/31/18 on evaluation today patient presents for initial evaluation or office for two issues that he's been having with openings in the lumbar spine extending into the lower thoracic region. Subsequently it appears that as best I can tell from his notes he likely had an epidural abscess which subsequently led to these open areas. He was seen by infectious disease, Rexene AlbertsStephanie Dixon, who referred him to us for management of the wounds. Apparently he has completed his course of IV antibiotic therapy at this point. Patient did have a technically limited examination of the cervical and thoracic spine by way of it and MRI. Unfortunately he was not able to tolerate the full length of the exam therefore only sagittal sections were obtained of the thoracic spine region. Fortunately there is no imaging findings to  suggest spinal cord infarction. There was interval improvement in the epidural collection (epidural abscess) on this examination when compared to the prior examination which was 03/22/18. The date of this MRI was 03/30/18. Again there was no imaging performed of the lumbar spine at this time. No new disc guidance or facet arthritis noted. There is no evidence of osteomyelitis. Currently the patient does not have any significant pain which is good news. With  that being said he is having some discomfort at this point mainly with packing of the wounds although I think packing is going to continue to be the treatment of choice. No fevers, chills, nausea, or vomiting noted at this time. 06/07/18 on evaluation today patient's wounds in the back region specifically the lumbar spine area appear to be doing very well at this time. In fact the 12 o'clock total of the cephalad wound shows evidence of not being quite as wide open as what I previously noted. In fact there is gonna be no chance of packing anything into this area at this time. The caudal wound actually shows signs of being about the same although again as I explained to the patient is gonna take some time for this area to heal. Readmission: 09/20/18 patient seen today for readmission concerning issues that he has been having with his back I previously saw him for this towards the end of last year 2019. Subsequently he was lost to follow-up due to not returning to the clinic. Subsequently what brings them in today is actually a different issue with his right lower quadrant abdominal area/groin where he has what appears to be an abscess at this time that for the most part is resolving but he actually has eschar covering the surface of the wound currently. With that being said he states this has been painful although it's less painful now than it's been in the past. He did attempt to use a razor blade to actually remove some of this dark eschar prior to  coming into the office today. With that being said he only did a very small portion and states he got too scared to continue. Again I believe it's best for him not to do this but rather allow [place] to do this if need be. Nonetheless I do think the escort coming off of benefit him he completely agrees. Fortunately there's no signs of active infection at this time. He does have some integration noted around the wound region. No fevers, chills, nausea, or vomiting noted at this time. Patient tells me that he currently is not utilizing any IV recreational drugs at this point he states he's doing very well in that regard. Readmission: 01/06/19 patient presents today for reevaluation in our clinic. I previously seen him four wounds on his back region in particular. Nonetheless he actually has wounds of this time in regard to his bilateral lower extremities which unfortunately have been giving him some issues for several weeks. Fortunately there's no signs of significant infection although there does seem to be some irritation. He states this will start out as red patches that become somewhat tender and then open up into ulcers. He has not gotten any answers as to what may be causing this. He does have a history of opioid abuse but tells me he has not used the recreational drugs in the past nine months. He does have hypertension in multiple lower extremity leg ulcers and varying degrees of severity and tissue exposure noted of the bilateral lower extremities. He also has diabetes. There is no history of trauma he does not know where these came from. Readmission: 08/15/2019 patient presents today for reevaluation here in the clinic concerning issues that he has been having with a wound on his left lower extremity as well as his right forearm. Fortunately neither appears to be extremely severe which is good news the left leg area is a region that he has had multiple times in the past  that we have been helping take  care of him. Again this seems to be more due to scar tissue and breakdown he does not really have significant edema but I still wonder if this could be more venous in nature potentially just due to the recurrence. Nonetheless he tells me that the forearm happened while he was working on a car that has not been running 30 years. There is nothing hot I suspect a chemical burn to this area where he might of gotten something on and that he did not realize that caused an issue here. This is very superficial. 08/22/2019 upon evaluation today patient appears to be doing well in general in regard to his arm ulcer as well as his proximal lower leg ulcer. Both are healing quite nicely and the arm in fact appears to be completely healed. Unfortunately the wound around his left medial lower leg distally is not doing as well it does not look terrible but at the same time there is tendon exposed is definitely a little bit deeper. He finds it strange that this is something that healed about a year ago and it was in the exact same location and pretty much looked identical to him. And then reopen. I am beginning to be somewhat suspicious about the possibility of a skin cancer. Obviously if it is there is something that we need to do with that sooner rather than later to not let this get out of control. Once I mention this the patient became somewhat reluctant to want to proceed with the biopsy. He states in general "I am the kind of person that would prefer not to know if it is cancer". I explained further that again this is something that if it is a skin cancer can be taken care of now if you wait for to spread it can be much more significant and even life-threatening. DEAUNDRA, KUTZER ([id number]) 08/29/2019 upon evaluation today patient appears to be doing a little better in regard to his wound at this point which is good news on the leg. I feel like he shown some signs of improvement. We have discussed the possibility  of biopsy for the site if things do not seem to be getting better but right now I do see a little bit of improvement compared to where we were previous. Fortunately there is no signs of active infection which is good news. He still wants to hold off on the biopsy I do not see anything so concerning that I think we need to rush down that road though I think we may Want to at least consider this in the future if things stall out. 09/12/2019 upon evaluation today patient appears to be doing really about the same with regard to his wound is measuring a little better but tracking in the right direction one of his wounds is healed the other is measuring smaller in general the biggest issue that I see at this point is that he does need to have some kind of dressing to try to help clean away some of the slough that continues to build up. He states the Santyl seems to do a lot better for him than the collagen did. I think we can switch back to the Santyl. Electronic Signature(s) Signed: 09/12/2019 11:18:41 AM By: Lenda Kelp PA-C Entered By: Lenda Kelp on 09/12/2019 11:18:41 Tony Long ([id number]) -------------------------------------------------------------------------------- Physical Exam Details Patient Name: Tony Long Date of Service: 09/12/2019 10:45 AM Medical Record Number: [id number] Patient Account Number:  [id number] Date of Birth/Sex: 1976-06-15 (44 y.o. M) Treating RN: Primary Care Provider: Raliegh Ip Other Clinician: Referring Provider: Raliegh Ip Treating Provider/Extender: STONE III, Ramere Downs Weeks in Treatment: 4 Constitutional Well-nourished and well-hydrated in no acute distress. Respiratory normal breathing without difficulty. Psychiatric this patient is able to make decisions and demonstrates good insight into disease process. Alert and Oriented x 3. pleasant and cooperative. Notes His wound did require some sharp debridement to clear away slough and  necrotic debris from the surface of the wound he tolerated that today without complication post debridement the wound bed appears to be doing much better. Electronic Signature(s) Signed: 09/12/2019 11:18:56 AM By: Lenda Kelp PA-C Entered By: Lenda Kelp on 09/12/2019 11:18:56 Tony Long ([id number]) -------------------------------------------------------------------------------- Physician Orders Details Patient Name: Tony Long Date of Service: 09/12/2019 10:45 AM Medical Record Number: [id number] Patient Account Number: [id number] Date of Birth/Sex: October 23, 1975 (44 y.o. M) Treating RN: Primary Care Provider: Raliegh Ip Other Clinician: Referring Provider: Raliegh Ip Treating Provider/Extender: Linwood Dibbles, Quy Lotts Weeks in Treatment: 4 Verbal / Phone Orders: No Diagnosis Coding ICD-10 Coding Code Description E11.622 Type 2 diabetes mellitus with other skin ulcer L97.822 Non-pressure chronic ulcer of other part of left lower leg with fat layer exposed S51.801A Unspecified open wound of right forearm, initial encounter F11.20 Opioid dependence, uncomplicated I10 Essential (primary) hypertension B18.2 Chronic viral hepatitis C Wound Cleansing Wound #12 Left,Medial Lower Leg o Clean wound with Normal Saline. - in office o Cleanse wound with mild soap and water Primary Wound Dressing Wound #12 Left,Medial Lower Leg o Santyl Ointment - cover with saline moistened gauze Secondary Dressing Wound #12 Left,Medial Lower Leg o [place] Dressing Change Frequency Wound #12 Left,Medial Lower Leg o Change dressing every other day. Follow-up Appointments Wound #12 Left,Medial Lower Leg o Return Appointment in 2 weeks. - Per patient request Patient Medications Allergies: No Known Drug Allergies Notifications Medication Indication Start End Santyl 09/12/2019 DOSE topical 250 unit/gram ointment - ointment topical Apply nickel thick to the wound bed and  then cover with a dressing as directed in clinic. Electronic Signature(s) Signed: 09/12/2019 11:17:31 AM By: Lenda Kelp PA-C Entered By: Lenda Kelp on 09/12/2019 11:17:30 Tony Long ([id number]) -------------------------------------------------------------------------------- Problem List Details Patient Name: Tony Long Date of Service: 09/12/2019 10:45 AM Medical Record Number: [id number] Patient Account Number: [id number] Date of Birth/Sex: Dec 12, 1975 (44 y.o. M) Treating RN: Primary Care Provider: Raliegh Ip Other Clinician: Referring Provider: Raliegh Ip Treating Provider/Extender: Linwood Dibbles, Kandon Hosking Weeks in Treatment: 4 Active Problems ICD-10 Evaluated Encounter Code Description Active Date Today Diagnosis E11.622 Type 2 diabetes mellitus with other skin ulcer 08/15/2019 No Yes L97.822 Non-pressure chronic ulcer of other part of left lower leg with fat layer 08/15/2019 No Yes exposed S51.801A Unspecified open wound of right forearm, initial encounter 08/15/2019 No Yes F11.20 Opioid dependence, uncomplicated 08/15/2019 No Yes I10 Essential (primary) hypertension 08/15/2019 No Yes B18.2 Chronic viral hepatitis C 08/15/2019 No Yes Inactive Problems Resolved Problems Electronic Signature(s) Signed: 09/12/2019 11:02:34 AM By: Lenda Kelp PA-C Entered By: Lenda Kelp on 09/12/2019 11:02:33 Tony Long ([id number]) -------------------------------------------------------------------------------- Progress Note Details Patient Name: Tony Long Date of Service: 09/12/2019 10:45 AM Medical Record Number: [id number] Patient Account Number: [id number] Date of Birth/Sex: 03/24/1976 (44 y.o. M) Treating RN: Primary Care Provider: Raliegh Ip Other Clinician: Referring Provider: Raliegh Ip Treating Provider/Extender: Linwood Dibbles, Saroya Riccobono Weeks in Treatment: 4 Subjective Chief Complaint Information obtained from Patient Right forearm and Left LE  Ulcers History of Present Illness (  HPI) The following HPI elements were documented for the patient's wound: Associated Signs and Symptoms: Patient does have a history of chronic viral hepatitis see, hypertension, and opioid dependence. 05/31/18 on evaluation today patient presents for initial evaluation or office for two issues that he's been having with openings in the lumbar spine extending into the lower thoracic region. Subsequently it appears that as best I can tell from his notes he likely had an epidural abscess which subsequently led to these open areas. He was seen by infectious disease, Rexene Alberts, who referred him to [place] for management of the wounds. Apparently he has completed his course of IV antibiotic therapy at this point. Patient did have a technically limited examination of the cervical and thoracic spine by way of it and MRI. Unfortunately he was not able to tolerate the full length of the exam therefore only sagittal sections were obtained of the thoracic spine region. Fortunately there is no imaging findings to suggest spinal cord infarction. There was interval improvement in the epidural collection (epidural abscess) on this examination when compared to the prior examination which was 03/22/18. The date of this MRI was 03/30/18. Again there was no imaging performed of the lumbar spine at this time. No new disc guidance or facet arthritis noted. There is no evidence of osteomyelitis. Currently the patient does not have any significant pain which is good news. With that being said he is having some discomfort at this point mainly with packing of the wounds although I think packing is going to continue to be the treatment of choice. No fevers, chills, nausea, or vomiting noted at this time. 06/07/18 on evaluation today patient's wounds in the back region specifically the lumbar spine area appear to be doing very well at this time. In fact the 12 o'clock total of the cephalad wound  shows evidence of not being quite as wide open as what I previously noted. In fact there is gonna be no chance of packing anything into this area at this time. The caudal wound actually shows signs of being about the same although again as I explained to the patient is gonna take some time for this area to heal. Readmission: 09/20/18 patient seen today for readmission concerning issues that he has been having with his back I previously saw him for this towards the end of last year 2019. Subsequently he was lost to follow-up due to not returning to the clinic. Subsequently what brings them in today is actually a different issue with his right lower quadrant abdominal area/groin where he has what appears to be an abscess at this time that for the most part is resolving but he actually has eschar covering the surface of the wound currently. With that being said he states this has been painful although it's less painful now than it's been in the past. He did attempt to use a razor blade to actually remove some of this dark eschar prior to coming into the office today. With that being said he only did a very small portion and states he got too scared to continue. Again I believe it's best for him not to do this but rather allow [place] to do this if need be. Nonetheless I do think the escort coming off of benefit him he completely agrees. Fortunately there's no signs of active infection at this time. He does have some integration noted around the wound region. No fevers, chills, nausea, or vomiting noted at this time. Patient tells me that he  currently is not utilizing any IV recreational drugs at this point he states he's doing very well in that regard. Readmission: 01/06/19 patient presents today for reevaluation in our clinic. I previously seen him four wounds on his back region in particular. Nonetheless he actually has wounds of this time in regard to his bilateral lower extremities which unfortunately have  been giving him some issues for several weeks. Fortunately there's no signs of significant infection although there does seem to be some irritation. He states this will start out as red patches that become somewhat tender and then open up into ulcers. He has not gotten any answers as to what may be causing this. He does have a history of opioid abuse but tells me he has not used the recreational drugs in the past nine months. He does have hypertension in multiple lower extremity leg ulcers and varying degrees of severity and tissue exposure noted of the bilateral lower extremities. He also has diabetes. There is no history of trauma he does not know where these came from. Readmission: 08/15/2019 patient presents today for reevaluation here in the clinic concerning issues that he has been having with a wound on his left lower extremity as well as his right forearm. Fortunately neither appears to be extremely severe which is good news the left leg area is a region that he has had multiple times in the past that we have been helping take care of him. Again this seems to be more due to scar tissue and breakdown he does not really have significant edema but I still wonder if this could be more venous in nature potentially just due to the recurrence. Nonetheless he tells me that the forearm happened while he was working on a car that has not been running 30 years. There is nothing hot I suspect a chemical burn to this area where he might of gotten something on and that he did not realize that caused an issue here. This is very superficial. 08/22/2019 upon evaluation today patient appears to be doing well in general in regard to his arm ulcer as well as his proximal lower leg ulcer. Both are healing quite nicely and the arm in fact appears to be completely healed. Unfortunately the wound around his left medial lower leg distally is not doing Tony Long, Tony Long ([id number]) as well it does not look terrible but  at the same time there is tendon exposed is definitely a little bit deeper. He finds it strange that this is something that healed about a year ago and it was in the exact same location and pretty much looked identical to him. And then reopen. I am beginning to be somewhat suspicious about the possibility of a skin cancer. Obviously if it is there is something that we need to do with that sooner rather than later to not let this get out of control. Once I mention this the patient became somewhat reluctant to want to proceed with the biopsy. He states in general "I am the kind of person that would prefer not to know if it is cancer". I explained further that again this is something that if it is a skin cancer can be taken care of now if you wait for to spread it can be much more significant and even life-threatening. 08/29/2019 upon evaluation today patient appears to be doing a little better in regard to his wound at this point which is good news on the leg. I feel like he shown some signs  of improvement. We have discussed the possibility of biopsy for the site if things do not seem to be getting better but right now I do see a little bit of improvement compared to where we were previous. Fortunately there is no signs of active infection which is good news. He still wants to hold off on the biopsy I do not see anything so concerning that I think we need to rush down that road though I think we may Want to at least consider this in the future if things stall out. 09/12/2019 upon evaluation today patient appears to be doing really about the same with regard to his wound is measuring a little better but tracking in the right direction one of his wounds is healed the other is measuring smaller in general the biggest issue that I see at this point is that he does need to have some kind of dressing to try to help clean away some of the slough that continues to build up. He states the Santyl seems to do a lot  better for him than the collagen did. I think we can switch back to the Santyl. Objective Constitutional Well-nourished and well-hydrated in no acute distress. Vitals Time Taken: 10:55 AM, Height: 71 in, Weight: 153 lbs, BMI: 21.3, Temperature: 98.4 F, Pulse: 90 bpm, Respiratory Rate: 16 breaths/min, Blood Pressure: 153/92 mmHg. Respiratory normal breathing without difficulty. Psychiatric this patient is able to make decisions and demonstrates good insight into disease process. Alert and Oriented x 3. pleasant and cooperative. General Notes: His wound did require some sharp debridement to clear away slough and necrotic debris from the surface of the wound he tolerated that today without complication post debridement the wound bed appears to be doing much better. Integumentary (Hair, Skin) Wound #12 status is Open. Original cause of wound was Gradually Appeared. The wound is located on the Left,Medial Lower Leg. The wound measures 3.2cm length x 2cm width x 0.2cm depth; 5.027cm^2 area and 1.005cm^3 volume. There is Fat Layer (Subcutaneous Tissue) Exposed exposed. There is no tunneling or undermining noted. There is a medium amount of sanguinous drainage noted. The wound margin is flat and intact. There is small (1-33%) pink granulation within the wound bed. There is a large (67-100%) amount of necrotic tissue within the wound bed including Adherent Slough. Wound #14 status is Healed - Epithelialized. Original cause of wound was Gradually Appeared. The wound is located on the Left,Proximal,Medial Lower Leg. The wound measures 0cm length x 0cm width x 0cm depth; 0cm^2 area and 0cm^3 volume. Assessment Active Problems ICD-10 Type 2 diabetes mellitus with other skin ulcer Non-pressure chronic ulcer of other part of left lower leg with fat layer exposed Unspecified open wound of right forearm, initial encounter Opioid dependence, uncomplicated Essential (primary) hypertension Chronic viral  hepatitis C Tony Long, Tony Long ([id number]) Procedures Wound #12 Pre-procedure diagnosis of Wound #12 is a Diabetic Wound/Ulcer of the Lower Extremity located on the Left,Medial Lower Leg .Severity of Tissue Pre Debridement is: Fat layer exposed. There was a Excisional Skin/Subcutaneous Tissue Debridement with a total area of 6.4 sq cm performed by STONE III, Tony Long Luviano E., PA-C. With the following instrument(s): Curette to remove Viable and Non-Viable tissue/material. Material removed includes Subcutaneous Tissue and Slough and after achieving pain control using Lidocaine. A time out was conducted at 11:13, prior to the start of the procedure. A Minimum amount of bleeding was controlled with Pressure. The procedure was tolerated well. Post Debridement Measurements: 3.2cm length x 2cm width x 0.2cm  depth; 1.005cm^3 volume. Character of Wound/Ulcer Post Debridement is stable. Severity of Tissue Post Debridement is: Fat layer exposed. Post procedure Diagnosis Wound #12: Same as Pre-Procedure Plan Wound Cleansing: Wound #12 Left,Medial Lower Leg: Clean wound with Normal Saline. - in office Cleanse wound with mild soap and water Primary Wound Dressing: Wound #12 Left,Medial Lower Leg: Santyl Ointment - cover with saline moistened gauze Secondary Dressing: Wound #12 Left,Medial Lower Leg: [place] Dressing Change Frequency: Wound #12 Left,Medial Lower Leg: Change dressing every other day. Follow-up Appointments: Wound #12 Left,Medial Lower Leg: Return Appointment in 2 weeks. - Per patient request The following medication(s) was prescribed: Santyl topical 250 unit/gram ointment ointment topical Apply nickel thick to the wound bed and then cover with a dressing as directed in clinic. starting 09/12/2019 1. My suggestion at this time is can be that we go ahead and initiate treatment with a switch back to the Santyl at this point he should use a saline moistened gauze over top of this and then apply  a [address] or large Band-Aid over top of that. 2. I am also can recommend that he continue to elevate his legs is not too swollen but at the same time if he has any swelling he needs to try to elevate as much as possible to keep that under control. 3. With regard to picking at his legs he has several small scabbed areas which he states it occurs as a result of him picking he is prone to doing this and subsequently will cause multiple scabbed areas over his legs is not really wounds but more just self-inflicted trauma. He states he has a real problem with this. I recommended that he put some Santyl over the area and covered with a Band-Aid if that occurs in order to try to keep him from picking at it. Again these do not really wounds but nonetheless are areas that he needs to leave alone and allow them to heal. We will see patient back for reevaluation in 1 week here in the clinic. If anything worsens or changes patient will contact our office for additional recommendations. Electronic Signature(s) Signed: 09/12/2019 11:20:18 AM By: Lenda Kelp PA-C Entered By: Lenda Kelp on 09/12/2019 11:20:18 Tony Long ([id number]) -------------------------------------------------------------------------------- SuperBill Details Patient Name: Tony Long Date of Service: 09/12/2019 Medical Record Number: [id number] Patient Account Number: [id number] Date of Birth/Sex: 1975/09/14 (44 y.o. M) Treating RN: Primary Care Provider: Raliegh Ip Other Clinician: Referring Provider: Raliegh Ip Treating Provider/Extender: Linwood Dibbles, Misty Rago Weeks in Treatment: 4 Diagnosis Coding ICD-10 Codes Code Description E11.622 Type 2 diabetes mellitus with other skin ulcer L97.822 Non-pressure chronic ulcer of other part of left lower leg with fat layer exposed S51.801A Unspecified open wound of right forearm, initial encounter F11.20 Opioid dependence, uncomplicated I10 Essential (primary)  hypertension B18.2 Chronic viral hepatitis C Facility Procedures CPT4 Code: 21308657 Description: 11042 - DEB SUBQ TISSUE 20 SQ CM/< Modifier: Quantity: 1 CPT4 Code: Description: ICD-10 Diagnosis Description L97.822 Non-pressure chronic ulcer of other part of left lower leg with fat layer ex Modifier: posed Quantity: Physician Procedures CPT4 Code: 8469629 Description: 99214 - WC PHYS LEVEL 4 - EST PT Modifier: 25 Quantity: 1 CPT4 Code: Description: ICD-10 Diagnosis Description E11.622 Type 2 diabetes mellitus with other skin ulcer L97.822 Non-pressure chronic ulcer of other part of left lower leg with fat layer ex S51.801A Unspecified open wound of right forearm, initial encounter  F11.20 Opioid dependence, uncomplicated Modifier: posed Quantity: CPT4 Code: 5284132 Description: 44010 -  WC PHYS SUBQ TISS 20 SQ CM Modifier: Quantity: 1 CPT4 Code: Description: ICD-10 Diagnosis Description L97.822 Non-pressure chronic ulcer of other part of left lower leg with fat layer ex Modifier: posed Quantity: Electronic Signature(s) Signed: 09/12/2019 11:22:07 AM By: Lenda Kelp PA-C Entered By: Lenda Kelp on 09/12/2019 11:22:06

## 2019-09-13 NOTE — Progress Notes (Addendum)
JWAN, HORNBAKER ([id number]) Visit Report for 09/12/2019 Arrival Information Details Patient Name: Tony Long, Tony Long Date of Service: 09/12/2019 10:45 AM Medical Record Number: [id number] Patient Account Number: [id number] Date of Birth/Sex: [birth year] (44 y.o. M) Treating RN: Primary Care Illianna Paschal: Raliegh Ip Other Clinician: Referring Mindel Friscia: Raliegh Ip Treating Auna Mikkelsen/Extender: Linwood Dibbles, HOYT Weeks in Treatment: 4 Visit Information History Since Last Visit Added or deleted any medications: No Patient Arrived: Ambulatory Any new allergies or adverse reactions: No Arrival Time: 10:57 Had a fall or experienced change in No Accompanied By: self activities of daily living that may affect Transfer Assistance: None risk of falls: Patient Identification Verified: Yes Signs or symptoms of abuse/neglect since last visito No Secondary Verification Process Completed: Yes Hospitalized since last visit: No Implantable device outside of the clinic excluding No cellular tissue based products placed in the center since last visit: Has Dressing in Place as Prescribed: Yes Pain Present Now: No Electronic Signature(s) Signed: 09/12/2019 4:23:57 PM By: Dayton Martes RCP, RRT, CHT Entered By: Dayton Martes on 09/12/2019 10:58:19 Tony Long ([id number]) -------------------------------------------------------------------------------- Encounter Discharge Information Details Patient Name: Tony Long Date of Service: 09/12/2019 10:45 AM Medical Record Number: [id number] Patient Account Number: [id number] Date of Birth/Sex: [birth year] (44 y.o. M) Treating RN: Rodell Perna Primary Care Tiara Maultsby: Raliegh Ip Other Clinician: Referring Acey Woodfield: Raliegh Ip Treating Skyrah Krupp/Extender: Linwood Dibbles, HOYT Weeks in Treatment: 4 Encounter Discharge Information Items Post Procedure Vitals Discharge Condition: Stable Temperature (F): 98.4 Ambulatory  Status: Ambulatory Pulse (bpm): 90 Discharge Destination: Home Respiratory Rate (breaths/min): 16 Transportation: Private Auto Blood Pressure (mmHg): 153/92 Accompanied By: self Schedule Follow-up Appointment: Yes Clinical Summary of Care: Electronic Signature(s) Signed: 09/13/2019 10:13:19 AM By: Rodell Perna Entered By: Rodell Perna on 09/12/2019 11:15:45 Tony Long ([id number]) -------------------------------------------------------------------------------- Lower Extremity Assessment Details Patient Name: Tony Long Date of Service: 09/12/2019 10:45 AM Medical Record Number: [id number] Patient Account Number: [id number] Date of Birth/Sex: 05-18-1976 (44 y.o. M) Treating RN: Primary Care Kindall Swaby: Raliegh Ip Other Clinician: Referring Cru Kritikos: Raliegh Ip Treating Martyn Timme/Extender: STONE III, HOYT Weeks in Treatment: 4 Edema Assessment Assessed: [Left: No] [Right: No] Edema: [Left: Ye] [Right: s] Vascular Assessment Pulses: Dorsalis Pedis Palpable: [Left:Yes] Electronic Signature(s) Signed: 09/12/2019 1:35:00 PM By: Lenda Kelp PA-C Entered By: Lenda Kelp on 09/12/2019 11:10:25 Tony Long ([id number]) -------------------------------------------------------------------------------- Multi Wound Chart Details Patient Name: Tony Long Date of Service: 09/12/2019 10:45 AM Medical Record Number: [id number] Patient Account Number: [id number] Date of Birth/Sex: [birth year] (44 y.o. M) Treating RN: Rodell Perna Primary Care Lylianna Fraiser: Raliegh Ip Other Clinician: Referring Alydia Gosser: Raliegh Ip Treating Rosea Dory/Extender: STONE III, HOYT Weeks in Treatment: 4 Vital Signs Height(in): 71 Pulse(bpm): 90 Weight(lbs): 153 Blood Pressure(mmHg): 153/92 Body Mass Index(BMI): 21 Temperature(F): 98.4 Respiratory Rate(breaths/min): 16 Photos: [12:No Photos] [14:No Photos] [N/A:N/A] Wound Location: [12:Left Lower Leg - Medial] [14:Left,  Proximal, Medial Lower Leg] [N/A:N/A] Wounding Event: [12:Gradually Appeared] [14:Gradually Appeared] [N/A:N/A] Primary Etiology: [12:Diabetic Wound/Ulcer of the Lower Extremity] [14:Diabetic Wound/Ulcer of the Lower Extremity] [N/A:N/A] Comorbid History: [12:Type II Diabetes] [14:N/A] [N/A:N/A] Date Acquired: [12:08/08/2019] [14:08/01/2019] [N/A:N/A] Weeks of Treatment: [12:4] [14:4] [N/A:N/A] Wound Status: [12:Open] [14:Healed - Epithelialized] [N/A:N/A] Measurements L x W x D (cm) [12:3.2x2x0.2] [14:0x0x0] [N/A:N/A] Area (cm) : [12:5.027] [14:0] [N/A:N/A] Volume (cm) : [12:1.005] [14:0] [N/A:N/A] % Reduction in Area: [12:-93.90%] [14:100.00%] [N/A:N/A] % Reduction in Volume: [12:-94.00%] [14:100.00%] [N/A:N/A] Classification: [12:Grade 1] [14:Grade 2] [N/A:N/A] Exudate Amount: [12:Medium] [14:N/A] [N/A:N/A] Exudate Type: [12:Sanguinous] [14:N/A] [N/A:N/A] Exudate Color: [12:red] [14:N/A] [N/A:N/A] Wound Margin: [12:Flat and Intact] [14:N/A] [N/A:N/A] Granulation  Amount: [12:Small (1-33%)] [14:N/A] [N/A:N/A] Granulation Quality: [12:Pink] [14:N/A] [N/A:N/A] Necrotic Amount: [12:Large (67-100%)] [14:N/A] [N/A:N/A] Exposed Structures: [12:Fat Layer (Subcutaneous Tissue) Exposed: Yes Fascia: No Tendon: No Muscle: No Joint: No Bone: No Small (1-33%)] [14:N/A N/A] [N/A:N/A N/A] Treatment Notes Electronic Signature(s) Signed: 09/13/2019 10:13:19 AM By: Army Melia Entered By: Army Melia on 09/12/2019 11:13:49 Tony Long ([id number]) -------------------------------------------------------------------------------- Multi-Disciplinary Care Plan Details Patient Name: Tony Long Date of Service: 09/12/2019 10:45 AM Medical Record Number: [id number] Patient Account Number: [id number] Date of Birth/Sex: [birth year] (44 y.o. M) Treating RN: Army Melia Primary Care Dominyk Law: Kathe Becton Other Clinician: Referring Kathrynn Backstrom: Kathe Becton Treating Yussef Jorge/Extender: Melburn Hake,  HOYT Weeks in Treatment: 4 Active Inactive Electronic Signature(s) Signed: 09/19/2019 4:54:59 PM By: Gretta Cool, BSN, RN, CWS, Kim RN, BSN Signed: 09/26/2019 11:52:58 AM By: Army Melia Previous Signature: 09/13/2019 10:13:19 AM Version By: Army Melia Entered By: Gretta Cool BSN, RN, CWS, Kim on 09/19/2019 16:54:59 Tony Long ([id number]) -------------------------------------------------------------------------------- Pain Assessment Details Patient Name: Tony Long Date of Service: 09/12/2019 10:45 AM Medical Record Number: [id number] Patient Account Number: [id number] Date of Birth/Sex: 10-18-1975 (44 y.o. M) Treating RN: Primary Care Abraham Margulies: Kathe Becton Other Clinician: Referring Merve Hotard: Kathe Becton Treating Oakes Mccready/Extender: STONE III, HOYT Weeks in Treatment: 4 Active Problems Location of Pain Severity and Description of Pain Patient Has Paino No Site Locations With Dressing Change: No Rate the pain. Current Pain Level: 0 Worst Pain Level: 0 Least Pain Level: 0 Tolerable Pain Level: 0 Pain Management and Medication Current Pain Management: Electronic Signature(s) Signed: 09/12/2019 1:35:00 PM By: Worthy Keeler PA-C Entered By: Worthy Keeler on 09/12/2019 11:06:02 Tony Long ([id number]) -------------------------------------------------------------------------------- Patient/Caregiver Education Details Patient Name: Tony Long Date of Service: 09/12/2019 10:45 AM Medical Record Number: [id number] Patient Account Number: [id number] Date of Birth/Gender: 08/18/1975 (44 y.o. M) Treating RN: Army Melia Primary Care Physician: Kathe Becton Other Clinician: Referring Physician: Kathe Becton Treating Physician/Extender: Sharalyn Ink in Treatment: 4 Education Assessment Education Provided To: Patient Education Topics Provided Wound/Skin Impairment: Handouts: Caring for Your Ulcer Methods: Demonstration, Explain/Verbal Responses:  State content correctly Electronic Signature(s) Signed: 09/13/2019 10:13:19 AM By: Army Melia Entered By: Army Melia on 09/12/2019 11:14:59 Tony Long ([id number]) -------------------------------------------------------------------------------- Wound Assessment Details Patient Name: Tony Long Date of Service: 09/12/2019 10:45 AM Medical Record Number: [id number] Patient Account Number: [id number] Date of Birth/Sex: 1976/06/03 (44 y.o. M) Treating RN: Army Melia Primary Care Cyndee Giammarco: Kathe Becton Other Clinician: Referring Minervia Osso: Kathe Becton Treating Jarom Govan/Extender: STONE III, HOYT Weeks in Treatment: 4 Wound Status Wound Number: 12 Primary Etiology: Diabetic Wound/Ulcer of the Lower Extremity Wound Location: Left Lower Leg - Medial Wound Status: Open Wounding Event: Gradually Appeared Comorbid History: Type II Diabetes Date Acquired: 08/08/2019 Weeks Of Treatment: 4 Clustered Wound: No Photos Wound Measurements Length: (cm) 3.2 Width: (cm) 2 Depth: (cm) 0.2 Area: (cm) 5.027 Volume: (cm) 1.005 % Reduction in Area: -93.9% % Reduction in Volume: -94% Epithelialization: Small (1-33%) Tunneling: No Undermining: No Wound Description Classification: Grade 1 Wound Margin: Flat and Intact Exudate Amount: Medium Exudate Type: Sanguinous Exudate Color: red Foul Odor After Cleansing: No Slough/Fibrino Yes Wound Bed Granulation Amount: Small (1-33%) Exposed Structure Granulation Quality: Pink Fascia Exposed: No Necrotic Amount: Large (67-100%) Fat Layer (Subcutaneous Tissue) Exposed: Yes Necrotic Quality: Adherent Slough Tendon Exposed: No Muscle Exposed: No Joint Exposed: No Bone Exposed: No Electronic Signature(s) Signed: 09/12/2019 11:18:10 AM By: Army Melia Entered By: Army Melia on 09/12/2019 11:18:09 Tony Long ([id number]) -------------------------------------------------------------------------------- Wound Assessment  Details Patient Name: Tony Long  Date of Service: 09/12/2019 10:45 AM Medical Record Number: [id number] Patient Account Number: [id number] Date of Birth/Sex: 1975/07/02 (44 y.o. M) Treating RN: Primary Care Usama Harkless: Raliegh Ip Other Clinician: Referring Jurrell Royster: Raliegh Ip Treating Latissa Frick/Extender: STONE III, HOYT Weeks in Treatment: 4 Wound Status Wound Number: 14 Primary Etiology: Diabetic Wound/Ulcer of the Lower Extremity Wound Location: Left, Proximal, Medial Lower Leg Wound Status: Healed - Epithelialized Wounding Event: Gradually Appeared Date Acquired: 08/01/2019 Weeks Of Treatment: 4 Clustered Wound: No Wound Measurements Length: (cm) Width: (cm) Depth: (cm) Area: (cm) Volume: (cm) 0 % Reduction in Area: 100% 0 % Reduction in Volume: 100% 0 0 0 Wound Description Classification: Grade 2 Electronic Signature(s) Signed: 09/12/2019 1:35:00 PM By: Lenda Kelp PA-C Entered By: Lenda Kelp on 09/12/2019 11:08:51 Tony Long ([id number]) -------------------------------------------------------------------------------- Vitals Details Patient Name: Tony Long Date of Service: 09/12/2019 10:45 AM Medical Record Number: [id number] Patient Account Number: [id number] Date of Birth/Sex: March 31, 1976 (43 y.o. M) Treating RN: Primary Care Aloha Bartok: Raliegh Ip Other Clinician: Referring Mckynlee Luse: Raliegh Ip Treating Vineta Carone/Extender: STONE III, HOYT Weeks in Treatment: 4 Vital Signs Time Taken: 10:55 Temperature (F): 98.4 Height (in): 71 Pulse (bpm): 90 Weight (lbs): 153 Respiratory Rate (breaths/min): 16 Body Mass Index (BMI): 21.3 Blood Pressure (mmHg): 153/92 Reference Range: 80 - 120 mg / dl Electronic Signature(s) Signed: 09/12/2019 4:23:57 PM By: Dayton Martes RCP, RRT, CHT Entered By: Weyman Rodney, Lucio Edward on 09/12/2019 10:58:51

## 2019-09-26 ENCOUNTER — Ambulatory Visit: Payer: Medicaid Other | Admitting: Physician Assistant

## 2019-10-03 ENCOUNTER — Telehealth: Payer: Self-pay | Admitting: Family Medicine

## 2019-10-03 NOTE — Telephone Encounter (Signed)
Pt called needing an extension on refills for Gabapentin. Pt states they typically have to call for this opposed to going to pharmacy.

## 2019-11-02 ENCOUNTER — Ambulatory Visit: Payer: Self-pay | Admitting: Family Medicine

## 2019-11-04 ENCOUNTER — Ambulatory Visit: Payer: Self-pay | Admitting: Family Medicine

## 2020-02-29 ENCOUNTER — Ambulatory Visit (INDEPENDENT_AMBULATORY_CARE_PROVIDER_SITE_OTHER): Payer: Medicaid Other | Admitting: Family Medicine

## 2020-02-29 ENCOUNTER — Encounter: Payer: Self-pay | Admitting: Family Medicine

## 2020-02-29 ENCOUNTER — Other Ambulatory Visit: Payer: Self-pay

## 2020-02-29 VITALS — BP 164/100 | HR 96 | Temp 98.6°F | Ht 71.0 in | Wt 149.8 lb

## 2020-02-29 DIAGNOSIS — S91302A Unspecified open wound, left foot, initial encounter: Secondary | ICD-10-CM

## 2020-02-29 DIAGNOSIS — R208 Other disturbances of skin sensation: Secondary | ICD-10-CM | POA: Diagnosis not present

## 2020-02-29 DIAGNOSIS — I160 Hypertensive urgency: Secondary | ICD-10-CM

## 2020-02-29 DIAGNOSIS — Z7289 Other problems related to lifestyle: Secondary | ICD-10-CM

## 2020-02-29 DIAGNOSIS — R739 Hyperglycemia, unspecified: Secondary | ICD-10-CM

## 2020-02-29 DIAGNOSIS — Z09 Encounter for follow-up examination after completed treatment for conditions other than malignant neoplasm: Secondary | ICD-10-CM | POA: Diagnosis not present

## 2020-02-29 DIAGNOSIS — E119 Type 2 diabetes mellitus without complications: Secondary | ICD-10-CM | POA: Diagnosis not present

## 2020-02-29 DIAGNOSIS — R7309 Other abnormal glucose: Secondary | ICD-10-CM | POA: Diagnosis not present

## 2020-02-29 DIAGNOSIS — Z794 Long term (current) use of insulin: Secondary | ICD-10-CM | POA: Diagnosis not present

## 2020-02-29 DIAGNOSIS — E785 Hyperlipidemia, unspecified: Secondary | ICD-10-CM

## 2020-02-29 DIAGNOSIS — R8290 Unspecified abnormal findings in urine: Secondary | ICD-10-CM | POA: Diagnosis not present

## 2020-02-29 DIAGNOSIS — I10 Essential (primary) hypertension: Secondary | ICD-10-CM

## 2020-02-29 DIAGNOSIS — G629 Polyneuropathy, unspecified: Secondary | ICD-10-CM | POA: Diagnosis not present

## 2020-02-29 DIAGNOSIS — R748 Abnormal levels of other serum enzymes: Secondary | ICD-10-CM

## 2020-02-29 DIAGNOSIS — F1090 Alcohol use, unspecified, uncomplicated: Secondary | ICD-10-CM

## 2020-02-29 DIAGNOSIS — E559 Vitamin D deficiency, unspecified: Secondary | ICD-10-CM

## 2020-02-29 DIAGNOSIS — Z789 Other specified health status: Secondary | ICD-10-CM

## 2020-02-29 HISTORY — DX: Other specified health status: Z78.9

## 2020-02-29 HISTORY — DX: Alcohol use, unspecified, uncomplicated: F10.90

## 2020-02-29 HISTORY — DX: Vitamin D deficiency, unspecified: E55.9

## 2020-02-29 HISTORY — DX: Abnormal levels of other serum enzymes: R74.8

## 2020-02-29 HISTORY — DX: Other problems related to lifestyle: Z72.89

## 2020-02-29 HISTORY — DX: Hyperlipidemia, unspecified: E78.5

## 2020-02-29 LAB — GLUCOSE, POCT (MANUAL RESULT ENTRY): POC Glucose: 134 mg/dl — AB (ref 70–99)

## 2020-02-29 LAB — POCT GLYCOSYLATED HEMOGLOBIN (HGB A1C)
HbA1c POC (<> result, manual entry): 9.1 % (ref 4.0–5.6)
HbA1c, POC (controlled diabetic range): 9.1 % — AB (ref 0.0–7.0)
HbA1c, POC (prediabetic range): 9.1 % — AB (ref 5.7–6.4)
Hemoglobin A1C: 9.1 % — AB (ref 4.0–5.6)

## 2020-02-29 LAB — POCT URINALYSIS DIPSTICK
Bilirubin, UA: NEGATIVE
Glucose, UA: POSITIVE — AB
Ketones, UA: NEGATIVE
Leukocytes, UA: NEGATIVE
Nitrite, UA: NEGATIVE
Protein, UA: POSITIVE — AB
Spec Grav, UA: 1.02 (ref 1.010–1.025)
Urobilinogen, UA: 1 E.U./dL
pH, UA: 7 (ref 5.0–8.0)

## 2020-02-29 MED ORDER — GABAPENTIN 300 MG PO CAPS: ORAL_CAPSULE | ORAL | 6 refills | Status: DC

## 2020-02-29 MED ORDER — GABAPENTIN 300 MG PO CAPS: 300.0000 mg | ORAL_CAPSULE | Freq: Three times a day (TID) | ORAL | 11 refills | Status: DC

## 2020-02-29 NOTE — Patient Instructions (Signed)
Food Choices to Help Relieve Diarrhea, Adult When you have diarrhea, the foods you eat and your eating habits are very important. Choosing the right foods and drinks can help:  Relieve diarrhea.  Replace lost fluids and nutrients.  Prevent dehydration. What general guidelines should I follow?  Relieving diarrhea  Choose foods with less than 2 g or .07 oz. of fiber per serving.  Limit fats to less than 8 tsp (38 g or 1.34 oz.) a day.  Avoid the following: ? Foods and beverages sweetened with high-fructose corn syrup, honey, or sugar alcohols such as xylitol, sorbitol, and mannitol. ? Foods that contain a lot of fat or sugar. ? Fried, greasy, or spicy foods. ? High-fiber grains, breads, and cereals. ? Raw fruits and vegetables.  Eat foods that are rich in probiotics. These foods include dairy products such as yogurt and fermented milk products. They help increase healthy bacteria in the stomach and intestines (gastrointestinal tract, or GI tract).  If you have lactose intolerance, avoid dairy products. These may make your diarrhea worse.  Take medicine to help stop diarrhea (antidiarrheal medicine) only as told by your health care provider. Replacing nutrients  Eat small meals or snacks every 3-4 hours.  Eat bland foods, such as white rice, toast, or baked potato, until your diarrhea starts to get better. Gradually reintroduce nutrient-rich foods as tolerated or as told by your health care provider. This includes: ? Well-cooked protein foods. ? Peeled, seeded, and soft-cooked fruits and vegetables. ? Low-fat dairy products.  Take vitamin and mineral supplements as told by your health care provider. Preventing dehydration  Start by sipping water or a special solution to prevent dehydration (oral rehydration solution, ORS). Urine that is clear or pale yellow means that you are getting enough fluid.  Try to drink at least 8-10 cups of fluid each day to help replace lost fluids.   You may add other liquids in addition to water, such as clear juice or decaffeinated sports drinks, as tolerated or as told by your health care provider.  Avoid drinks with caffeine, such as coffee, tea, or soft drinks.  Avoid alcohol. What foods are recommended?     The items listed may not be a complete list. Talk with your health care provider about what dietary choices are best for you. Grains White rice. White, [place], or pita breads (fresh or toasted), including plain rolls, buns, or bagels. White pasta. Saltine, soda, or graham crackers. Pretzels. Low-fiber cereal. Cooked cereals made with water (such as cornmeal, farina, or cream cereals). Plain muffins. Matzo. Melba toast. Zwieback. Vegetables Potatoes (without the skin). Most well-cooked and canned vegetables without skins or seeds. Tender lettuce. Fruits Apple sauce. Fruits canned in juice. Cooked apricots, cherries, grapefruit, peaches, pears, or plums. Fresh bananas and cantaloupe. Meats and other protein foods Baked or boiled chicken. Eggs. Tofu. Fish. Seafood. Smooth nut butters. Ground or well-cooked tender beef, ham, veal, lamb, pork, or poultry. Dairy Plain yogurt, kefir, and unsweetened liquid yogurt. Lactose-free milk, buttermilk, skim milk, or soy milk. Low-fat or nonfat hard cheese. Beverages Water. Low-calorie sports drinks. Fruit juices without pulp. Strained tomato and vegetable juices. Decaffeinated teas. Sugar-free beverages not sweetened with sugar alcohols. Oral rehydration solutions, if approved by your health care provider. Seasoning and other foods Bouillon, broth, or soups made from recommended foods. What foods are not recommended? The items listed may not be a complete list. Talk with your health care provider about what dietary choices are best for you. Grains Whole  grain, whole wheat, bran, or rye breads, rolls, pastas, and crackers. Wild or brown rice. Whole grain or bran cereals. Barley.  Oats and oatmeal. Corn tortillas or taco shells. Granola. Popcorn. Vegetables Raw vegetables. Fried vegetables. Cabbage, broccoli, Brussels sprouts, artichokes, baked beans, beet greens, corn, kale, legumes, peas, sweet potatoes, and yams. Potato skins. Cooked spinach and cabbage. Fruits Dried fruit, including raisins and dates. Raw fruits. Stewed or dried prunes. Canned fruits with syrup. Meat and other protein foods Fried or fatty meats. Deli meats. Chunky nut butters. Nuts and seeds. Beans and lentils. Bacon. Hot dogs. Sausage. Dairy High-fat cheeses. Whole milk, chocolate milk, and beverages made with milk, such as milk shakes. Half-and-half. Cream. sour cream. Ice cream. Beverages Caffeinated beverages (such as coffee, tea, soda, or energy drinks). Alcoholic beverages. Fruit juices with pulp. Prune juice. Soft drinks sweetened with high-fructose corn syrup or sugar alcohols. High-calorie sports drinks. Fats and oils Butter. Cream sauces. Margarine. Salad oils. Plain salad dressings. Olives. Avocados. Mayonnaise. Sweets and desserts Sweet rolls, doughnuts, and sweet breads. Sugar-free desserts sweetened with sugar alcohols such as xylitol and sorbitol. Seasoning and other foods Honey. Hot sauce. Chili powder. Gravy. Cream-based or milk-based soups. Pancakes and waffles. Summary  When you have diarrhea, the foods you eat and your eating habits are very important.  Make sure you get at least 8-10 cups of fluid each day, or enough to keep your urine clear or pale yellow.  Eat bland foods and gradually reintroduce healthy, nutrient-rich foods as tolerated, or as told by your health care provider.  Avoid high-fiber, fried, greasy, or spicy foods. This information is not intended to replace advice given to you by your health care provider. Make sure you discuss any questions you have with your health care provider. Document Revised: 10/07/2018 Document Reviewed: 06/13/2016 [employer] Patient  Education  2020 [employer]  Diarrhea, Adult Diarrhea is when you pass loose and watery poop (stool) often. Diarrhea can make you feel weak and cause you to lose water in your body (get dehydrated). Losing water in your body can cause you to:  Feel tired and thirsty.  Have a dry mouth.  Go pee (urinate) less often. Diarrhea often lasts 2-3 days. However, it can last longer if it is a sign of something more serious. It is important to treat your diarrhea as told by your doctor. Follow these instructions at home: Eating and drinking     Follow these instructions as told by your doctor:  Take an ORS (oral rehydration solution). This is a drink that helps you replace fluids and minerals your body lost. It is sold at pharmacies and stores.  Drink plenty of fluids, such as: ? Water. ? Ice chips. ? Diluted fruit juice. ? Low-calorie sports drinks. ? Milk, if you want.  Avoid drinking fluids that have a lot of sugar or caffeine in them.  Eat bland, easy-to-digest foods in small amounts as you are able. These foods include: ? Bananas. ? Applesauce. ? Rice. ? Low-fat (lean) meats. ? Toast. ? Crackers.  Avoid alcohol.  Avoid spicy or fatty foods.  Medicines  Take over-the-counter and prescription medicines only as told by your doctor.  If you were prescribed an antibiotic medicine, take it as told by your doctor. Do not stop using the antibiotic even if you start to feel better. General instructions   Wash your hands often using soap and water. If soap and water are not available, use a hand sanitizer. Others in your home should   their hands as well. Hands should be washed: ? After using the toilet or changing a diaper. ? Before preparing, cooking, or serving food. ? While caring for a sick person. ? While visiting someone in a hospital.  Drink enough fluid to keep your pee (urine) pale yellow.  Rest at home while you get better.  Watch your condition for any changes.   Take a warm bath to help with any burning or pain from having diarrhea.  Keep all follow-up visits as told by your doctor. This is important. Contact a doctor if:  You have a fever.  Your diarrhea gets worse.  You have new symptoms.  You cannot keep fluids down.  You feel light-headed or dizzy.  You have a headache.  You have muscle cramps. Get help right away if:  You have chest pain.  You feel very weak or you pass out (faint).  You have bloody or black poop or poop that looks like tar.  You have very bad pain, cramping, or bloating in your belly (abdomen).  You have trouble breathing or you are breathing very quickly.  Your heart is beating very quickly.  Your skin feels cold and clammy.  You feel confused.  You have signs of losing too much water in your body, such as: ? Dark pee, very little pee, or no pee. ? Cracked lips. ? Dry mouth. ? Sunken eyes. ? Sleepiness. ? Weakness. Summary  Diarrhea is when you pass loose and watery poop (stool) often.  Diarrhea can make you feel weak and cause you to lose water in your body (get dehydrated).  Take an ORS (oral rehydration solution). This is a drink that is sold at pharmacies and stores.  Eat bland, easy-to-digest foods in small amounts as you are able.  Contact a doctor if your condition gets worse. Get help right away if you have signs that you have lost too much water in your body. This information is not intended to replace advice given to you by your health care provider. Make sure you discuss any questions you have with your health care provider. Document Revised: 11/20/2017 Document Reviewed: 11/20/2017 [employer] Patient Education  2020 [employer].

## 2020-02-29 NOTE — Progress Notes (Signed)
[facility]   Sick Visit  Subjective:  Patient ID: Tony Long, male    DOB: 1976/06/22  Age: 44 y.o. MRN: [id number]  CC:  Chief Complaint  Patient presents with  . Diarrhea    Pt states he is having some diarrhea. Pt states when he eat is regular color but; if he didn't eat it's a mucus and white color.X1wk.    HPI Tony Long is a 44 year old male who presents for Follow Up today.    Patient Active Problem List   Diagnosis Date Noted  . Hemoglobin A1C greater than 9%, indicating poor diabetic control 08/09/2019  . History of delayed wound healing 08/09/2019  . IV drug user 08/09/2019  . Neuropathy 03/29/2019  . Wound of right foot 03/29/2019  . Hypertension 05/17/2018  . 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 Back abscess 03/21/2018  . Opioid use disorder, severe, dependence (HCC) 03/21/2018  . Osteomyelitis (HCC) 03/20/2018  . Chronic hepatitis C without hepatic coma (HCC) 07/02/2016  . Diabetes (HCC) 03/14/2016    Past Medical History:  Diagnosis Date  . Diabetes mellitus without complication (HCC)   . Hypertension   . IV drug user   . Wound healing, delayed     Past Surgical History:  Procedure Laterality Date  . INCISION AND DRAINAGE ABSCESS N/A 03/22/2018   Procedure: INCISION AND DRAINAGE ABSCESS;  Surgeon: Violeta Gelinas, MD;  Location: [facility] OR;  Service: General;  Laterality: N/A;  . TEE WITHOUT CARDIOVERSION  03/22/2018   Procedure: TRANSESOPHAGEAL ECHOCARDIOGRAM (TEE);  Surgeon: Radiologist, Medication, MD;  Location: [facility] OR;  Service: Open Heart Surgery;;    Family History  Problem Relation Age of Onset  . Heart disease Father   . Diabetes Father   . Diabetes Paternal Uncle    Current Status: Since his last office visit, he has c/o diarrhea X 1 week. He has been using Imodium D with minimal relief. He has been drinking of water and has began to eat soups. He has not had an episode of  diarrhea today.  He denies other GI problems such as constipation. He has no reports of blood in stools, dysuria and hematuria. He has seen low range of 70 and high of 300 since his last office visit. He denies fatigue, frequent urination, blurred vision, excessive hunger, excessive thirst, weight gain, weight loss, and poor wound healing. He continues to check his feet regularly. His blood pressure readings are elevated today. He states that he is currently not taking Metoprolol for blood pressure.  He denies visual changes, chest pain, cough, shortness of breath, heart palpitations, and falls. He has occasional headaches and dizziness with position changes. Denies severe headaches, confusion, seizures, double vision, and blurred vision, nausea and vomiting. He denies fevers, chills, recent infections, weight loss, and night sweats. No depression or anxiety reported today. He is taking all medications as prescribed. He denies pain today.   Social History   Socioeconomic History  . Marital status: Single    Spouse name: Not on file  . Number of children: Not on file  . Years of education: Not on file  . Highest education level: Not on file  Occupational History  . Not on file  Tobacco Use  . Smoking status: Current Every Day Smoker    Packs/day: 1.00  . Smokeless tobacco: Never Used  Vaping Use  . Vaping Use: Never used  Substance and Sexual Activity  . Alcohol use: Not  Currently    Comment: seldom  . Drug use: No    Comment: pt mother said hx of use  . Sexual activity: Yes    Partners: Female  Other Topics Concern  . Not on file  Social History Narrative  . Not on file   Social Determinants of Health   Financial Resource Strain:   . Difficulty of Paying Living Expenses: Not on file  Food Insecurity:   . Worried About Programme researcher, broadcasting/film/video in the Last Year: Not on file  . Ran Out of Food in the Last Year: Not on file  Transportation Needs:   . Lack of Transportation (Medical):  Not on file  . Lack of Transportation (Non-Medical): Not on file  Physical Activity:   . Days of Exercise per Week: Not on file  . Minutes of Exercise per Session: Not on file  Stress:   . Feeling of Stress : Not on file  Social Connections:   . Frequency of Communication with Friends and Family: Not on file  . Frequency of Social Gatherings with Friends and Family: Not on file  . Attends Religious Services: Not on file  . Active Member of Clubs or Organizations: Not on file  . Attends Banker Meetings: Not on file  . Marital Status: Not on file  Intimate Partner Violence:   . Fear of Current or Ex-Partner: Not on file  . Emotionally Abused: Not on file  . Physically Abused: Not on file  . Sexually Abused: Not on file    Outpatient Medications Prior to Visit  Medication Sig Dispense Refill  . Insulin Glargine (LANTUS) 100 UNIT/ML Solostar Pen Inject 50 Units into the skin daily at 10 pm. 45 mL 11  . insulin lispro (HUMALOG) 100 UNIT/ML injection Inject 0.1 mLs (10 Units total) into the skin 3 (three) times daily with meals. 60 mL 11  . gabapentin (NEURONTIN) 300 MG capsule Take 2 capsule (600 mg= total) by mouth, 2 times a day. 120 capsule 6  . cyclobenzaprine (FLEXERIL) 5 MG tablet Take 1-2 tablets (5-10 mg total) by mouth 3 (three) times daily as needed for muscle spasms. (Patient not taking: Reported on 08/08/2019) 20 tablet 0  . empagliflozin (JARDIANCE) 10 MG TABS tablet Take 10 mg by mouth daily before breakfast. (Patient not taking: Reported on 02/29/2020) 30 tablet 3  . glucose blood test strip Use as instructed (Patient not taking: Reported on 02/29/2020) 100 each 12  . ibuprofen (ADVIL,MOTRIN) 600 MG tablet Take 1 tablet (600 mg total) by mouth every 8 (eight) hours as needed. (Patient not taking: Reported on 03/28/2019) 30 tablet 0  . Lancets (ONETOUCH ULTRASOFT) lancets Use as instructed (Patient not taking: Reported on 02/29/2020) 100 each 12  . lisinopril (ZESTRIL) 5  MG tablet Take 1 tablet (5 mg total) by mouth daily. (Patient not taking: Reported on 02/29/2020) 90 tablet 3  . metoprolol tartrate (LOPRESSOR) 50 MG tablet Take 1 tablet (50 mg total) by mouth 2 (two) times daily. (Patient not taking: Reported on 02/29/2020) 60 tablet 6  . pravastatin (PRAVACHOL) 40 MG tablet Take 1 tablet (40 mg total) by mouth daily. (Patient not taking: Reported on 02/29/2020) 90 tablet 3  . sildenafil (VIAGRA) 25 MG tablet Take 1 tablet (25 mg total) by mouth daily as needed for erectile dysfunction. (Patient not taking: Reported on 02/29/2020) 90 tablet 3   No facility-administered medications prior to visit.    No Known Allergies  ROS Review of Systems  Constitutional: Negative.   HENT: Negative.   Eyes: Negative.   Respiratory: Negative.   Cardiovascular: Negative.   Gastrointestinal: Positive for diarrhea (occasional ).  Endocrine: Negative.   Genitourinary: Negative.   Musculoskeletal: Positive for arthralgias (generalized joint pain).  Skin: Negative.   Allergic/Immunologic: Negative.   Neurological: Positive for dizziness (occasional ) and headaches (occasional ).  Hematological: Negative.   Psychiatric/Behavioral: Negative.    Objective:    Physical Exam Vitals and nursing note reviewed.  Constitutional:      Appearance: Normal appearance.  HENT:     Head: Normocephalic and atraumatic.     Nose: Nose normal.     Mouth/Throat:     Mouth: Mucous membranes are moist.     Pharynx: Oropharynx is clear.  Cardiovascular:     Rate and Rhythm: Normal rate and regular rhythm.     Pulses: Normal pulses.     Heart sounds: Normal heart sounds.  Pulmonary:     Effort: Pulmonary effort is normal.     Breath sounds: Normal breath sounds.  Abdominal:     General: Bowel sounds are normal.     Palpations: Abdomen is soft.  Musculoskeletal:        General: Normal range of motion.     Cervical back: Normal range of motion and neck supple.     Right lower leg:  Edema present.     Left lower leg: Edema present.  Skin:    General: Skin is warm and dry.  Neurological:     General: No focal deficit present.     Mental Status: He is alert and oriented to person, place, and time.     Sensory: Sensory deficit (bilateral foot; decreased L>R) present.     Comments: Decreased sensitivity over entire feet. Moderate sensitivity inner medial aspect of right foot.   Psychiatric:        Mood and Affect: Mood normal.        Behavior: Behavior normal.        Thought Content: Thought content normal.        Judgment: Judgment normal.    BP (!) 164/100 (BP Location: Right Arm, Patient Position: Sitting, Cuff Size: Normal)   Pulse 96   Temp 98.6 F (37 C)   Ht 5\' 11"  (1.803 m)   Wt 149 lb 12.8 oz (67.9 kg)   SpO2 98%   BMI 20.89 kg/m  Wt Readings from Last 3 Encounters:  02/29/20 149 lb 12.8 oz (67.9 kg)  08/08/19 155 lb (70.3 kg)  03/28/19 157 lb 6.4 oz (71.4 kg)     Health Maintenance Due  Topic Date Due  . PNEUMOCOCCAL POLYSACCHARIDE VACCINE AGE 20-64 HIGH RISK  Never done  . OPHTHALMOLOGY EXAM  Never done  . COVID-19 Vaccine (1) Never done  . TETANUS/TDAP  Never done  . FOOT EXAM  09/07/2019  . INFLUENZA VACCINE  Never done    There are no preventive care reminders to display for this patient.  Lab Results  Component Value Date   TSH 2.590 02/29/2020   Lab Results  Component Value Date   WBC 4.1 02/29/2020   HGB 14.5 02/29/2020   HCT 41.5 02/29/2020   MCV 101 (H) 02/29/2020   PLT 107 (L) 02/29/2020   Lab Results  Component Value Date   NA 145 (H) 02/29/2020   K 3.5 02/29/2020   CO2 27 02/29/2020   GLUCOSE 99 02/29/2020   BUN 7 02/29/2020   CREATININE 0.59 (L) 02/29/2020  BILITOT 0.6 02/29/2020   ALKPHOS 135 (H) 02/29/2020   AST 227 (H) 02/29/2020   ALT 70 (H) 02/29/2020   PROT 8.0 02/29/2020   ALBUMIN 4.7 02/29/2020   CALCIUM 9.6 02/29/2020   ANIONGAP 8 04/17/2018   Lab Results  Component Value Date   CHOL 227 (H)  02/29/2020   Lab Results  Component Value Date   HDL 165 02/29/2020   Lab Results  Component Value Date   LDLCALC 50 02/29/2020   Lab Results  Component Value Date   TRIG 70 02/29/2020   Lab Results  Component Value Date   CHOLHDL 1.4 02/29/2020   Lab Results  Component Value Date   HGBA1C 9.1 (A) 02/29/2020   HGBA1C 9.1 02/29/2020   HGBA1C 9.1 (A) 02/29/2020   HGBA1C 9.1 (A) 02/29/2020    Assessment & Plan:   1. Type 2 diabetes mellitus without complication, with long-term current use of insulin (HCC) He will continue medication as prescribed, to decrease foods/beverages high in sugars and carbs and follow Heart Healthy or DASH diet. Increase physical activity to at least 30 minutes cardio exercise daily.  - Urinalysis Dipstick - POC Glucose (CBG) - POC HgB A1c - CBC with Differential - Comprehensive metabolic panel - TSH - Lipid Panel - Vitamin B12 - Vitamin D, 25-hydroxy - gabapentin (NEURONTIN) 300 MG capsule; Take 1 capsule (300 mg total) by mouth 3 (three) times daily. Take 2 capsules (600 mg = total), by mouth, 3 times a day.  Dispense: 180 capsule; Refill: 11  2. Hemoglobin A1C greater than 9%, indicating poor diabetic control Hgb A1c is decreased at 9.1 today, from 10.2 on 08/08/2019. Monitor.  - gabapentin (NEURONTIN) 300 MG capsule; Take 1 capsule (300 mg total) by mouth 3 (three) times daily. Take 2 capsules (600 mg = total), by mouth, 3 times a day.  Dispense: 180 capsule; Refill: 11  3. Hyperglycemia  4. Wound of left foot - gabapentin (NEURONTIN) 300 MG capsule; Take 1 capsule (300 mg total) by mouth 3 (three) times daily. Take 2 capsules (600 mg = total), by mouth, 3 times a day.  Dispense: 180 capsule; Refill: 11  5. Encounter for diabetic foot exam (HCC) Positive. Foot exam tolerated well. Extensive areas of decreased sensitivity noted upon foot exam. Patient counseled on proper foot hygiene. He is encouraged to exam feet often (daily), using mirror  if necessary; keep feet clean and dry (especially between toes), keep feet moistened, wear cotton socks, and avoid wearing open-toed shoes, high-heel shoes, and sandals. Patient verbalized understanding.   6. Neuropathy Increased. We will increased frequency of Gabapentin today.  - gabapentin (NEURONTIN) 300 MG capsule; Take 1 capsule (300 mg total) by mouth 3 (three) times daily. Take 2 capsules (600 mg = total), by mouth, 3 times a day.  Dispense: 180 capsule; Refill: 11  7. Hypertension, unspecified type Blood pressures are elevated today. Patient refused to repeat checks of blood pressure. We referred him to ED via ambulance at this time. Patient refused and signed AMA form at discharge. He denies severe headaches, confusion, seizures, double vision, and blurred vision, nausea and vomiting. He will report to ED if he experiences these symptoms. Patient verbalized understanding.    8. Abnormal urine finding Results are pending.  - Urine Culture  9. Decreased sensation of foot We will increased dose frequency today. Monitor.  - gabapentin (NEURONTIN) 300 MG capsule; Take 1 capsule (300 mg total) by mouth 3 (three) times daily. Take 2 capsules (600 mg =  total), by mouth, 3 times a day.  Dispense: 180 capsule; Refill: 11  10. Follow up He will follow up in 6 months.   Meds ordered this encounter  Medications  . DISCONTD: gabapentin (NEURONTIN) 300 MG capsule    Sig: Take 1 capsule (300 mg total) by mouth 3 (three) times daily. Take 2 capsules (600 mg = total), by mouth, 3 times a day.    Dispense:  180 capsule    Refill:  11  . gabapentin (NEURONTIN) 300 MG capsule    Sig: Take 2 capsule (600 mg= total) by mouth, 3 times a day.    Dispense:  120 capsule    Refill:  6    Orders Placed This Encounter  Procedures  . Urine Culture  . CBC with Differential  . Comprehensive metabolic panel  . TSH  . Lipid Panel  . Vitamin B12  . Vitamin D, 25-hydroxy  . Urinalysis Dipstick  . POC  Glucose (CBG)  . POC HgB A1c    Referral Orders  No referral(s) requested today    Raliegh IpNatalie Niralya Ohanian,  MSN, FNP-BC [facility] [address]  [place], [place] [id number] [phone] fax  Problem List Items Addressed This Visit      Cardiovascular and Mediastinum   Hypertension     Endocrine   Diabetes (HCC)   Relevant Orders   Urinalysis Dipstick (Completed)   POC Glucose (CBG) (Completed)   POC HgB A1c (Completed)   CBC with Differential (Completed)   Comprehensive metabolic panel (Completed)   TSH (Completed)   Lipid Panel (Completed)   Vitamin B12 (Completed)   Vitamin D, 25-hydroxy (Completed)   Hemoglobin A1C greater than 9%, indicating poor diabetic control     Nervous and Auditory   Neuropathy   Relevant Medications   gabapentin (NEURONTIN) 300 MG capsule    Other Visit Diagnoses    Hypertensive urgency    -  Primary   Hyperglycemia       Wound of left foot       Decreased sensation of foot       Encounter for diabetic foot exam (HCC)       Abnormal urine finding       Relevant Orders   Urine Culture (Completed)   Follow up          Meds ordered this encounter  Medications  . DISCONTD: gabapentin (NEURONTIN) 300 MG capsule    Sig: Take 1 capsule (300 mg total) by mouth 3 (three) times daily. Take 2 capsules (600 mg = total), by mouth, 3 times a day.    Dispense:  180 capsule    Refill:  11  . gabapentin (NEURONTIN) 300 MG capsule    Sig: Take 2 capsule (600 mg= total) by mouth, 3 times a day.    Dispense:  120 capsule    Refill:  6    Follow-up: Return in about 6 months (around 08/28/2020).    Kallie LocksNatalie M Zaynah Chawla, FNP

## 2020-03-01 ENCOUNTER — Other Ambulatory Visit: Payer: Self-pay | Admitting: Family Medicine

## 2020-03-01 LAB — CBC WITH DIFFERENTIAL/PLATELET
Basophils Absolute: 0.1 10*3/uL (ref 0.0–0.2)
Basos: 1 %
EOS (ABSOLUTE): 0.1 10*3/uL (ref 0.0–0.4)
Eos: 3 %
Hematocrit: 41.5 % (ref 37.5–51.0)
Hemoglobin: 14.5 g/dL (ref 13.0–17.7)
Immature Grans (Abs): 0 10*3/uL (ref 0.0–0.1)
Immature Granulocytes: 0 %
Lymphocytes Absolute: 1.7 10*3/uL (ref 0.7–3.1)
Lymphs: 41 %
MCH: 35.3 pg — ABNORMAL HIGH (ref 26.6–33.0)
MCHC: 34.9 g/dL (ref 31.5–35.7)
MCV: 101 fL — ABNORMAL HIGH (ref 79–97)
Monocytes Absolute: 0.2 10*3/uL (ref 0.1–0.9)
Monocytes: 5 %
Neutrophils Absolute: 2 10*3/uL (ref 1.4–7.0)
Neutrophils: 50 %
Platelets: 107 10*3/uL — ABNORMAL LOW (ref 150–450)
RBC: 4.11 x10E6/uL — ABNORMAL LOW (ref 4.14–5.80)
RDW: 13.5 % (ref 11.6–15.4)
WBC: 4.1 10*3/uL (ref 3.4–10.8)

## 2020-03-01 LAB — COMPREHENSIVE METABOLIC PANEL
ALT: 70 IU/L — ABNORMAL HIGH (ref 0–44)
AST: 227 IU/L — ABNORMAL HIGH (ref 0–40)
Albumin/Globulin Ratio: 1.4 (ref 1.2–2.2)
Albumin: 4.7 g/dL (ref 4.0–5.0)
Alkaline Phosphatase: 135 IU/L — ABNORMAL HIGH (ref 48–121)
BUN/Creatinine Ratio: 12 (ref 9–20)
BUN: 7 mg/dL (ref 6–24)
Bilirubin Total: 0.6 mg/dL (ref 0.0–1.2)
CO2: 27 mmol/L (ref 20–29)
Calcium: 9.6 mg/dL (ref 8.7–10.2)
Chloride: 95 mmol/L — ABNORMAL LOW (ref 96–106)
Creatinine, Ser: 0.59 mg/dL — ABNORMAL LOW (ref 0.76–1.27)
GFR calc Af Amer: 143 mL/min/{1.73_m2} (ref >59)
GFR calc non Af Amer: 124 mL/min/{1.73_m2} (ref >59)
Globulin, Total: 3.3 g/dL (ref 1.5–4.5)
Glucose: 99 mg/dL (ref 65–99)
Potassium: 3.5 mmol/L (ref 3.5–5.2)
Sodium: 145 mmol/L — ABNORMAL HIGH (ref 134–144)
Total Protein: 8 g/dL (ref 6.0–8.5)

## 2020-03-01 LAB — LIPID PANEL
Chol/HDL Ratio: 1.4 ratio (ref 0.0–5.0)
Cholesterol, Total: 227 mg/dL — ABNORMAL HIGH (ref 100–199)
HDL: 165 mg/dL (ref >39)
LDL Chol Calc (NIH): 50 mg/dL (ref 0–99)
Triglycerides: 70 mg/dL (ref 0–149)
VLDL Cholesterol Cal: 12 mg/dL (ref 5–40)

## 2020-03-01 LAB — VITAMIN B12: Vitamin B-12: 935 pg/mL (ref 232–1245)

## 2020-03-01 LAB — VITAMIN D 25 HYDROXY (VIT D DEFICIENCY, FRACTURES): Vit D, 25-Hydroxy: 10.6 ng/mL — ABNORMAL LOW (ref 30.0–100.0)

## 2020-03-01 LAB — TSH: TSH: 2.59 u[IU]/mL (ref 0.450–4.500)

## 2020-03-02 ENCOUNTER — Encounter: Payer: Self-pay | Admitting: Family Medicine

## 2020-03-02 LAB — URINE CULTURE: Organism ID, Bacteria: NO GROWTH

## 2020-03-08 ENCOUNTER — Encounter: Payer: Self-pay | Admitting: Family Medicine

## 2020-03-09 ENCOUNTER — Other Ambulatory Visit: Payer: Self-pay | Admitting: Family Medicine

## 2020-03-09 ENCOUNTER — Encounter: Payer: Self-pay | Admitting: Family Medicine

## 2020-03-09 DIAGNOSIS — E559 Vitamin D deficiency, unspecified: Secondary | ICD-10-CM

## 2020-03-09 MED ORDER — VITAMIN D (ERGOCALCIFEROL) 1.25 MG (50000 UNIT) PO CAPS: 50000.0000 [IU] | ORAL_CAPSULE | ORAL | 6 refills | Status: DC

## 2020-03-30 ENCOUNTER — Encounter: Payer: Self-pay | Admitting: Family Medicine

## 2020-03-30 ENCOUNTER — Ambulatory Visit (INDEPENDENT_AMBULATORY_CARE_PROVIDER_SITE_OTHER): Payer: Medicaid Other | Admitting: Family Medicine

## 2020-03-30 ENCOUNTER — Other Ambulatory Visit: Payer: Self-pay

## 2020-03-30 VITALS — BP 105/70 | HR 75 | Temp 98.1°F | Resp 20 | Ht 71.0 in | Wt 149.0 lb

## 2020-03-30 DIAGNOSIS — S91302A Unspecified open wound, left foot, initial encounter: Secondary | ICD-10-CM

## 2020-03-30 DIAGNOSIS — L02419 Cutaneous abscess of limb, unspecified: Secondary | ICD-10-CM

## 2020-03-30 DIAGNOSIS — Z09 Encounter for follow-up examination after completed treatment for conditions other than malignant neoplasm: Secondary | ICD-10-CM | POA: Diagnosis not present

## 2020-03-30 DIAGNOSIS — R739 Hyperglycemia, unspecified: Secondary | ICD-10-CM

## 2020-03-30 DIAGNOSIS — Z794 Long term (current) use of insulin: Secondary | ICD-10-CM | POA: Diagnosis not present

## 2020-03-30 DIAGNOSIS — I10 Essential (primary) hypertension: Secondary | ICD-10-CM | POA: Diagnosis not present

## 2020-03-30 DIAGNOSIS — R7309 Other abnormal glucose: Secondary | ICD-10-CM | POA: Diagnosis not present

## 2020-03-30 DIAGNOSIS — E119 Type 2 diabetes mellitus without complications: Secondary | ICD-10-CM | POA: Diagnosis not present

## 2020-03-30 MED ORDER — AMOXICILLIN-POT CLAVULANATE 875-125 MG PO TABS: 1.0000 | ORAL_TABLET | Freq: Two times a day (BID) | ORAL | 0 refills | Status: DC

## 2020-03-30 NOTE — Patient Instructions (Signed)
Amoxicillin; Clavulanic Acid Tablets What is this medicine? AMOXICILLIN; CLAVULANIC ACID (a mox i SIL in; KLAV yoo lan ic AS id) is a penicillin antibiotic. It treats some infections caused by bacteria. It will not work for colds, the flu, or other viruses. This medicine may be used for other purposes; ask your health care provider or pharmacist if you have questions. COMMON BRAND NAME(S): Augmentin What should I tell my health care provider before I take this medicine? They need to know if you have any of these conditions:  bowel disease, like colitis  kidney disease  liver disease  mononucleosis  an unusual or allergic reaction to amoxicillin, penicillin, cephalosporin, other antibiotics, clavulanic acid, other medicines, foods, dyes, or preservatives  pregnant or trying to get pregnant  breast-feeding How should I use this medicine? Take this drug by mouth. Take it as directed on the prescription label at the same time every day. Take it with food at the start of a meal or snack. Take all of this drug unless your health care provider tells you to stop it early. Keep taking it even if you think you are better. Talk to your health care provider about the use of this drug in children. While it may be prescribed for selected conditions, precautions do apply. Overdosage: If you think you have taken too much of this medicine contact a poison control center or emergency room at once. NOTE: This medicine is only for you. Do not share this medicine with others. What if I miss a dose? If you miss a dose, take it as soon as you can. If it is almost time for your next dose, take only that dose. Do not take double or extra doses. What may interact with this medicine?  allopurinol  anticoagulants  birth control pills  methotrexate  probenecid This list may not describe all possible interactions. Give your health care provider a list of all the medicines, herbs, non-prescription drugs, or  dietary supplements you use. Also tell them if you smoke, drink alcohol, or use illegal drugs. Some items may interact with your medicine. What should I watch for while using this medicine? Tell your doctor or healthcare provider if your symptoms do not improve. This medicine may cause serious skin reactions. They can happen weeks to months after starting the medicine. Contact your healthcare provider right away if you notice fevers or flu-like symptoms with a rash. The rash may be red or purple and then turn into blisters or peeling of the skin. Or, you might notice a red rash with swelling of the face, lips or lymph nodes in your neck or under your arms. Do not treat diarrhea with over the counter products. Contact your doctor if you have diarrhea that lasts more than 2 days or if it is severe and watery. If you have diabetes, you may get a false-positive result for sugar in your urine. Check with your doctor or healthcare provider. Birth control pills may not work properly while you are taking this medicine. Talk to your doctor about using an extra method of birth control. What side effects may I notice from receiving this medicine? Side effects that you should report to your doctor or health care professional as soon as possible:  allergic reactions like skin rash, itching or hives, swelling of the face, lips, or tongue  breathing problems  dark urine  fever or chills, sore throat  redness, blistering, peeling, or loosening of the skin, including inside the mouth  seizures  trouble passing urine or change in the amount of urine  unusual bleeding, bruising  unusually weak or tired  white patches or sores in the mouth or throat Side effects that usually do not require medical attention (report to your doctor or health care professional if they continue or are bothersome):  diarrhea  dizziness  headache  nausea, vomiting  stomach upset  vaginal or anal irritation This list  may not describe all possible side effects. Call your doctor for medical advice about side effects. You may report side effects to FDA at 1-800-FDA-1088. Where should I keep my medicine? Keep out of the reach of children and pets. Store at room temperature between 20 and 25 degrees C (68 and 77 degrees F). Throw away any unused drug after the expiration date. NOTE: This sheet is a summary. It may not cover all possible information. If you have questions about this medicine, talk to your doctor, pharmacist, or health care provider.  2020 Elsevier/Gold Standard (2019-01-17 11:55:53) Skin Abscess  A skin abscess is an infected area of your skin that contains pus and other material. An abscess can happen in any part of your body. Some abscesses break open (rupture) on their own. Most continue to get worse unless they are treated. The infection can spread deeper into the body and into your blood, which can make you feel sick. A skin abscess is caused by germs that enter the skin through a cut or scrape. It can also be caused by blocked oil and sweat glands or infected hair follicles. This condition is usually treated by:  Draining the pus.  Taking antibiotic medicines.  Placing a warm, wet washcloth over the abscess. Follow these instructions at home: Medicines   Take over-the-counter and prescription medicines only as told by your doctor.  If you were prescribed an antibiotic medicine, take it as told by your doctor. Do not stop taking the antibiotic even if you start to feel better. Abscess care   If you have an abscess that has not drained, place a warm, clean, wet washcloth over the abscess several times a day. Do this as told by your doctor.  Follow instructions from your doctor about how to take care of your abscess. Make sure you: ? Cover the abscess with a bandage (dressing). ? Change your bandage or gauze as told by your doctor. ? Wash your hands with soap and water before you  change the bandage or gauze. If you cannot use soap and water, use hand sanitizer.  Check your abscess every day for signs that the infection is getting worse. Check for: ? More redness, swelling, or pain. ? More fluid or blood. ? Warmth. ? More pus or a bad smell. General instructions  To avoid spreading the infection: ? Do not share personal care items, towels, or hot tubs with others. ? Avoid making skin-to-skin contact with other people.  Keep all follow-up visits as told by your doctor. This is important. Contact a doctor if:  You have more redness, swelling, or pain around your abscess.  You have more fluid or blood coming from your abscess.  Your abscess feels warm when you touch it.  You have more pus or a bad smell coming from your abscess.  You have a fever.  Your muscles ache.  You have chills.  You feel sick. Get help right away if:  You have very bad (severe) pain.  You see red streaks on your skin spreading away from the abscess. Summary  A skin abscess is an infected area of your skin that contains pus and other material.  The abscess is caused by germs that enter the skin through a cut or scrape. It can also be caused by blocked oil and sweat glands or infected hair follicles.  Follow your doctor's instructions on caring for your abscess, taking medicines, preventing infections, and keeping follow-up visits. This information is not intended to replace advice given to you by your health care provider. Make sure you discuss any questions you have with your health care provider. Document Revised: 01/20/2019 Document Reviewed: 07/30/2017 Elsevier Patient Education  2020 [employer].

## 2020-03-30 NOTE — Progress Notes (Signed)
[facility]   Sick Visit  Subjective:  Patient ID: Tony Long, male    DOB: [birth year]  Age: 44 y.o. MRN: [id number]  CC: No chief complaint on file.   HPI Tony Long is a 44 year old male who presents for Follow Up today.    Patient Active Problem List   Diagnosis Date Noted  . Hemoglobin A1C greater than 9%, indicating poor diabetic control 08/09/2019  . History of delayed wound healing 08/09/2019  . IV drug user 08/09/2019  . Neuropathy 03/29/2019  . Wound of right foot 03/29/2019  . Hypertension 05/17/2018  . 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 Back abscess 03/21/2018  . Opioid use disorder, severe, dependence (HCC) 03/21/2018  . Osteomyelitis (HCC) 03/20/2018  . Chronic hepatitis C without hepatic coma (HCC) 07/02/2016  . Diabetes (HCC) 03/14/2016    Current Status: Since his last office visit, He is doing well with no complaints. He reports 2 upper torso abscesses. He states that he developed these abscesses a week ago and they are worsening. Upper right abscess had gotten a little better. He has been using warm water rinses on abscesses to get them to come to a head, with no success with abscess of upper left torso. He denies visual changes, chest pain, cough, shortness of breath, heart palpitations, and falls. He has occasional headaches and dizziness with position changes. Denies severe headaches, confusion, seizures, double vision, and blurred vision, nausea and vomiting. He has not been monitoring his blood glucose levels regularly lately. He denies fatigue, frequent urination, blurred vision, excessive hunger, excessive thirst, weight gain, weight loss, and poor wound healing. He continues to check his feet regularly. He denies fevers, chills, recent infections, weight loss, and night sweats. Denies GI problems such as nausea, vomiting, diarrhea, and constipation. He has no reports of blood in stools,  dysuria and hematuria. No depression or anxiety reported.  He is taking all medications as prescribed. He denies pain today.   Past Medical History:  Diagnosis Date  . Alcohol use 02/2020  . Diabetes mellitus without complication (HCC)   . Elevated liver enzymes 02/2020  . Elevated liver enzymes 02/2020  . Hyperlipidemia 02/2020  . Hypertension   . IV drug user   . Vitamin D deficiency 02/2020  . Wound healing, delayed     Past Surgical History:  Procedure Laterality Date  . INCISION AND DRAINAGE ABSCESS N/A 03/22/2018   Procedure: INCISION AND DRAINAGE ABSCESS;  Surgeon: Violeta Gelinas, MD;  Location: [facility] OR;  Service: General;  Laterality: N/A;  . TEE WITHOUT CARDIOVERSION  03/22/2018   Procedure: TRANSESOPHAGEAL ECHOCARDIOGRAM (TEE);  Surgeon: Radiologist, Medication, MD;  Location: [facility] OR;  Service: Open Heart Surgery;;    Family History  Problem Relation Age of Onset  . Heart disease Father   . Diabetes Father   . Diabetes Paternal Uncle     Social History   Socioeconomic History  . Marital status: Single    Spouse name: Not on file  . Number of children: Not on file  . Years of education: Not on file  . Highest education level: Not on file  Occupational History  . Not on file  Tobacco Use  . Smoking status: Current Every Day Smoker    Packs/day: 1.00  . Smokeless tobacco: Never Used  Vaping Use  . Vaping Use: Never used  Substance and Sexual Activity  . Alcohol use: Not Currently    Comment: seldom  .  Drug use: No    Comment: pt mother said hx of use  . Sexual activity: Yes    Partners: Female  Other Topics Concern  . Not on file  Social History Narrative  . Not on file   Social Determinants of Health   Financial Resource Strain:   . Difficulty of Paying Living Expenses: Not on file  Food Insecurity:   . Worried About Programme researcher, broadcasting/film/video in the Last Year: Not on file  . Ran Out of Food in the Last Year: Not on file  Transportation Needs:   . Lack  of Transportation (Medical): Not on file  . Lack of Transportation (Non-Medical): Not on file  Physical Activity:   . Days of Exercise per Week: Not on file  . Minutes of Exercise per Session: Not on file  Stress:   . Feeling of Stress : Not on file  Social Connections:   . Frequency of Communication with Friends and Family: Not on file  . Frequency of Social Gatherings with Friends and Family: Not on file  . Attends Religious Services: Not on file  . Active Member of Clubs or Organizations: Not on file  . Attends Banker Meetings: Not on file  . Marital Status: Not on file  Intimate Partner Violence:   . Fear of Current or Ex-Partner: Not on file  . Emotionally Abused: Not on file  . Physically Abused: Not on file  . Sexually Abused: Not on file    Outpatient Medications Prior to Visit  Medication Sig Dispense Refill  . cyclobenzaprine (FLEXERIL) 5 MG tablet Take 1-2 tablets (5-10 mg total) by mouth 3 (three) times daily as needed for muscle spasms. (Patient not taking: Reported on 08/08/2019) 20 tablet 0  . empagliflozin (JARDIANCE) 10 MG TABS tablet Take 10 mg by mouth daily before breakfast. (Patient not taking: Reported on 02/29/2020) 30 tablet 3  . gabapentin (NEURONTIN) 300 MG capsule Take 2 capsule (600 mg= total) by mouth, 3 times a day. 120 capsule 6  . glucose blood test strip Use as instructed (Patient not taking: Reported on 02/29/2020) 100 each 12  . ibuprofen (ADVIL,MOTRIN) 600 MG tablet Take 1 tablet (600 mg total) by mouth every 8 (eight) hours as needed. (Patient not taking: Reported on 03/28/2019) 30 tablet 0  . Insulin Glargine (LANTUS) 100 UNIT/ML Solostar Pen Inject 50 Units into the skin daily at 10 pm. 45 mL 11  . insulin lispro (HUMALOG) 100 UNIT/ML injection Inject 0.1 mLs (10 Units total) into the skin 3 (three) times daily with meals. 60 mL 11  . Lancets (ONETOUCH ULTRASOFT) lancets Use as instructed (Patient not taking: Reported on 02/29/2020) 100 each  12  . lisinopril (ZESTRIL) 5 MG tablet Take 1 tablet (5 mg total) by mouth daily. (Patient not taking: Reported on 02/29/2020) 90 tablet 3  . metoprolol tartrate (LOPRESSOR) 50 MG tablet Take 1 tablet (50 mg total) by mouth 2 (two) times daily. (Patient not taking: Reported on 02/29/2020) 60 tablet 6  . pravastatin (PRAVACHOL) 40 MG tablet Take 1 tablet (40 mg total) by mouth daily. (Patient not taking: Reported on 02/29/2020) 90 tablet 3  . sildenafil (VIAGRA) 25 MG tablet Take 1 tablet (25 mg total) by mouth daily as needed for erectile dysfunction. (Patient not taking: Reported on 02/29/2020) 90 tablet 3  . Vitamin D, Ergocalciferol, (DRISDOL) 1.25 MG (50000 UNIT) CAPS capsule Take 1 capsule (50,000 Units total) by mouth every 7 (seven) days. 5 capsule 6  No facility-administered medications prior to visit.    No Known Allergies  ROS Review of Systems  Constitutional: Negative.   HENT: Negative.   Eyes: Negative.   Respiratory: Negative.   Cardiovascular: Negative.   Gastrointestinal: Negative.   Endocrine: Negative.   Genitourinary: Negative.   Musculoskeletal: Positive for arthralgias (generalized joint pain).  Skin: Positive for wound (left foot wound).  Allergic/Immunologic: Negative.   Neurological: Positive for dizziness (occasional ) and headaches (occasional ).  Hematological: Negative.   Psychiatric/Behavioral: Negative.    Objective:    Physical Exam Vitals and nursing note reviewed.  Constitutional:      Appearance: Normal appearance.  HENT:     Head: Normocephalic and atraumatic.  Cardiovascular:     Rate and Rhythm: Normal rate and regular rhythm.     Pulses: Normal pulses.     Heart sounds: Normal heart sounds.  Pulmonary:     Effort: Pulmonary effort is normal.     Breath sounds: Normal breath sounds.  Abdominal:     General: Abdomen is flat. Bowel sounds are normal.  Musculoskeletal:        General: Normal range of motion.     Cervical back: Normal range of  motion and neck supple.  Skin:    General: Skin is warm and dry.     Comments: Left foot wound  Neurological:     General: No focal deficit present.     Mental Status: He is alert and oriented to person, place, and time.  Psychiatric:        Mood and Affect: Mood normal.        Behavior: Behavior normal.        Thought Content: Thought content normal.        Judgment: Judgment normal.     BP 105/70   Pulse 75   Temp 98.1 F (36.7 C)   Resp 20   Ht 5\' 11"  (1.803 m)   Wt 149 lb (67.6 kg)   SpO2 95%   BMI 20.78 kg/m  Wt Readings from Last 3 Encounters:  03/30/20 149 lb (67.6 kg)  02/29/20 149 lb 12.8 oz (67.9 kg)  08/08/19 155 lb (70.3 kg)     Health Maintenance Due  Topic Date Due  . PNEUMOCOCCAL POLYSACCHARIDE VACCINE AGE 75-64 HIGH RISK  Never done  . OPHTHALMOLOGY EXAM  Never done  . COVID-19 Vaccine (1) Never done  . TETANUS/TDAP  Never done  . FOOT EXAM  09/07/2019  . INFLUENZA VACCINE  Never done    There are no preventive care reminders to display for this patient.  Lab Results  Component Value Date   TSH 2.590 02/29/2020   Lab Results  Component Value Date   WBC 4.1 02/29/2020   HGB 14.5 02/29/2020   HCT 41.5 02/29/2020   MCV 101 (H) 02/29/2020   PLT 107 (L) 02/29/2020   Lab Results  Component Value Date   NA 145 (H) 02/29/2020   K 3.5 02/29/2020   CO2 27 02/29/2020   GLUCOSE 99 02/29/2020   BUN 7 02/29/2020   CREATININE 0.59 (L) 02/29/2020   BILITOT 0.6 02/29/2020   ALKPHOS 135 (H) 02/29/2020   AST 227 (H) 02/29/2020   ALT 70 (H) 02/29/2020   PROT 8.0 02/29/2020   ALBUMIN 4.7 02/29/2020   CALCIUM 9.6 02/29/2020   ANIONGAP 8 04/17/2018   Lab Results  Component Value Date   CHOL 227 (H) 02/29/2020   Lab Results  Component Value Date  HDL 165 02/29/2020   Lab Results  Component Value Date   LDLCALC 50 02/29/2020   Lab Results  Component Value Date   TRIG 70 02/29/2020   Lab Results  Component Value Date   CHOLHDL 1.4  02/29/2020   Lab Results  Component Value Date   HGBA1C 9.1 (A) 02/29/2020   HGBA1C 9.1 02/29/2020   HGBA1C 9.1 (A) 02/29/2020   HGBA1C 9.1 (A) 02/29/2020   Assessment & Plan:   1. Hypertension, unspecified type The current medical regimen is effective; blood pressure is stable at 105/70 today; continue present plan and medications as prescribed. He will continue to take medications as prescribed, to decrease high sodium intake, excessive alcohol intake, increase potassium intake, smoking cessation, and increase physical activity of at least 30 minutes of cardio activity daily. He will continue to follow Heart Healthy or DASH diet.  2. Type 2 diabetes mellitus without complication, with long-term current use of insulin (HCC) He will continue medication as prescribed, to decrease foods/beverages high in sugars and carbs and follow Heart Healthy or DASH diet. Increase physical activity to at least 30 minutes cardio exercise daily.   3. Hemoglobin A1C greater than 9%, indicating poor diabetic control Hgb A1c is stable at 9.1 today. Monitor.  4. Hyperglycemia  5. Wound of left foot Well healing wound.   6. Cutaneous abscess of upper extremity, unspecified laterality - amoxicillin-clavulanate (AUGMENTIN) 875-125 MG tablet; Take 1 tablet by mouth 2 (two) times daily for 20 days.  Dispense: 40 tablet; Refill: 0  7. Follow up He will follow up in 6 months.   Meds ordered this encounter  Medications  . amoxicillin-clavulanate (AUGMENTIN) 875-125 MG tablet    Sig: Take 1 tablet by mouth 2 (two) times daily for 20 days.    Dispense:  40 tablet    Refill:  0    No orders of the defined types were placed in this encounter.   Referral Orders  No referral(s) requested today    Raliegh Ip,  MSN, FNP-BC [facility] [address] [place], [place] [address] [phone]-  fax  Problem List Items Addressed This Visit      Cardiovascular and Mediastinum   Hypertension - Primary     Endocrine   Diabetes (HCC)   Hemoglobin A1C greater than 9%, indicating poor diabetic control    Other Visit Diagnoses    Hyperglycemia       Wound of left foot       Cutaneous abscess of upper extremity, unspecified laterality       Relevant Medications   amoxicillin-clavulanate (AUGMENTIN) 875-125 MG tablet   Follow up          Meds ordered this encounter  Medications  . amoxicillin-clavulanate (AUGMENTIN) 875-125 MG tablet    Sig: Take 1 tablet by mouth 2 (two) times daily for 20 days.    Dispense:  40 tablet    Refill:  0    Follow-up: No follow-ups on file.    [name], FNP

## 2020-04-05 ENCOUNTER — Encounter: Payer: Self-pay | Admitting: Family Medicine

## 2020-04-06 ENCOUNTER — Emergency Department: Payer: Medicaid Other

## 2020-04-06 ENCOUNTER — Emergency Department: Payer: Self-pay

## 2020-04-06 ENCOUNTER — Other Ambulatory Visit: Payer: Self-pay

## 2020-04-06 ENCOUNTER — Inpatient Hospital Stay
Admission: EM | Admit: 2020-04-06 | Discharge: 2020-04-09 | DRG: 638 | Disposition: A | Discharge status: Home Health | Payer: Medicaid Other | Attending: Internal Medicine | Admitting: Internal Medicine

## 2020-04-06 ENCOUNTER — Encounter: Payer: Self-pay | Admitting: Emergency Medicine

## 2020-04-06 DIAGNOSIS — N179 Acute kidney failure, unspecified: Secondary | ICD-10-CM

## 2020-04-06 DIAGNOSIS — E1165 Type 2 diabetes mellitus with hyperglycemia: Secondary | ICD-10-CM | POA: Diagnosis not present

## 2020-04-06 DIAGNOSIS — Z79899 Other long term (current) drug therapy: Secondary | ICD-10-CM | POA: Diagnosis not present

## 2020-04-06 DIAGNOSIS — Z20822 Contact with and (suspected) exposure to covid-19: Secondary | ICD-10-CM | POA: Diagnosis present

## 2020-04-06 DIAGNOSIS — R5381 Other malaise: Secondary | ICD-10-CM | POA: Diagnosis not present

## 2020-04-06 DIAGNOSIS — E876 Hypokalemia: Secondary | ICD-10-CM | POA: Diagnosis not present

## 2020-04-06 DIAGNOSIS — W19XXXA Unspecified fall, initial encounter: Secondary | ICD-10-CM | POA: Diagnosis not present

## 2020-04-06 DIAGNOSIS — E1110 Type 2 diabetes mellitus with ketoacidosis without coma: Secondary | ICD-10-CM | POA: Diagnosis not present

## 2020-04-06 DIAGNOSIS — L8990 Pressure ulcer of unspecified site, unspecified stage: Secondary | ICD-10-CM | POA: Insufficient documentation

## 2020-04-06 DIAGNOSIS — R4182 Altered mental status, unspecified: Secondary | ICD-10-CM | POA: Diagnosis not present

## 2020-04-06 DIAGNOSIS — R000 Tachycardia, unspecified: Secondary | ICD-10-CM

## 2020-04-06 DIAGNOSIS — E860 Dehydration: Secondary | ICD-10-CM | POA: Diagnosis not present

## 2020-04-06 DIAGNOSIS — Z8249 Family history of ischemic heart disease and other diseases of the circulatory system: Secondary | ICD-10-CM

## 2020-04-06 DIAGNOSIS — Z794 Long term (current) use of insulin: Secondary | ICD-10-CM

## 2020-04-06 DIAGNOSIS — R21 Rash and other nonspecific skin eruption: Secondary | ICD-10-CM | POA: Diagnosis not present

## 2020-04-06 DIAGNOSIS — E559 Vitamin D deficiency, unspecified: Secondary | ICD-10-CM | POA: Diagnosis present

## 2020-04-06 DIAGNOSIS — L89312 Pressure ulcer of right buttock, stage 2: Secondary | ICD-10-CM | POA: Diagnosis present

## 2020-04-06 DIAGNOSIS — D696 Thrombocytopenia, unspecified: Secondary | ICD-10-CM | POA: Diagnosis not present

## 2020-04-06 DIAGNOSIS — L0291 Cutaneous abscess, unspecified: Secondary | ICD-10-CM | POA: Diagnosis not present

## 2020-04-06 DIAGNOSIS — R739 Hyperglycemia, unspecified: Secondary | ICD-10-CM | POA: Diagnosis not present

## 2020-04-06 DIAGNOSIS — I10 Essential (primary) hypertension: Secondary | ICD-10-CM | POA: Diagnosis present

## 2020-04-06 DIAGNOSIS — L02213 Cutaneous abscess of chest wall: Secondary | ICD-10-CM | POA: Diagnosis present

## 2020-04-06 DIAGNOSIS — F17210 Nicotine dependence, cigarettes, uncomplicated: Secondary | ICD-10-CM | POA: Diagnosis present

## 2020-04-06 DIAGNOSIS — E872 Acidosis, unspecified: Secondary | ICD-10-CM

## 2020-04-06 DIAGNOSIS — Z833 Family history of diabetes mellitus: Secondary | ICD-10-CM

## 2020-04-06 DIAGNOSIS — E1140 Type 2 diabetes mellitus with diabetic neuropathy, unspecified: Secondary | ICD-10-CM | POA: Diagnosis present

## 2020-04-06 DIAGNOSIS — E785 Hyperlipidemia, unspecified: Secondary | ICD-10-CM | POA: Diagnosis present

## 2020-04-06 LAB — CBC WITH DIFFERENTIAL/PLATELET
Abs Immature Granulocytes: 0.08 10*3/uL — ABNORMAL HIGH (ref 0.00–0.07)
Basophils Absolute: 0.1 10*3/uL (ref 0.0–0.1)
Basophils Relative: 0 %
Eosinophils Absolute: 0 10*3/uL (ref 0.0–0.5)
Eosinophils Relative: 0 %
HCT: 34.3 % — ABNORMAL LOW (ref 39.0–52.0)
Hemoglobin: 12.1 g/dL — ABNORMAL LOW (ref 13.0–17.0)
Immature Granulocytes: 1 %
Lymphocytes Relative: 4 %
Lymphs Abs: 0.6 10*3/uL — ABNORMAL LOW (ref 0.7–4.0)
MCH: 35.3 pg — ABNORMAL HIGH (ref 26.0–34.0)
MCHC: 35.3 g/dL (ref 30.0–36.0)
MCV: 100 fL (ref 80.0–100.0)
Monocytes Absolute: 0.4 10*3/uL (ref 0.1–1.0)
Monocytes Relative: 3 %
Neutro Abs: 13.5 10*3/uL — ABNORMAL HIGH (ref 1.7–7.7)
Neutrophils Relative %: 92 %
Platelets: 101 10*3/uL — ABNORMAL LOW (ref 150–400)
RBC: 3.43 MIL/uL — ABNORMAL LOW (ref 4.22–5.81)
RDW: 11.6 % (ref 11.5–15.5)
WBC: 14.6 10*3/uL — ABNORMAL HIGH (ref 4.0–10.5)
nRBC: 0 % (ref 0.0–0.2)

## 2020-04-06 LAB — COMPREHENSIVE METABOLIC PANEL
ALT: 45 U/L — ABNORMAL HIGH (ref 0–44)
AST: 76 U/L — ABNORMAL HIGH (ref 15–41)
Albumin: 3.6 g/dL (ref 3.5–5.0)
Alkaline Phosphatase: 191 U/L — ABNORMAL HIGH (ref 38–126)
Anion gap: 37 — ABNORMAL HIGH (ref 5–15)
BUN: 16 mg/dL (ref 6–20)
CO2: 15 mmol/L — ABNORMAL LOW (ref 22–32)
Calcium: 8.5 mg/dL — ABNORMAL LOW (ref 8.9–10.3)
Chloride: 74 mmol/L — ABNORMAL LOW (ref 98–111)
Creatinine, Ser: 1.64 mg/dL — ABNORMAL HIGH (ref 0.61–1.24)
GFR, Estimated: 50 mL/min — ABNORMAL LOW (ref >60)
Glucose, Bld: 441 mg/dL — ABNORMAL HIGH (ref 70–99)
Potassium: 3.1 mmol/L — ABNORMAL LOW (ref 3.5–5.1)
Sodium: 126 mmol/L — ABNORMAL LOW (ref 135–145)
Total Bilirubin: 3.4 mg/dL — ABNORMAL HIGH (ref 0.3–1.2)
Total Protein: 8.1 g/dL (ref 6.5–8.1)

## 2020-04-06 LAB — BLOOD GAS, VENOUS
Acid-base deficit: 8.7 mmol/L — ABNORMAL HIGH (ref 0.0–2.0)
Bicarbonate: 16.6 mmol/L — ABNORMAL LOW (ref 20.0–28.0)
O2 Saturation: 33.4 %
Patient temperature: 37
pCO2, Ven: 33 mmHg — ABNORMAL LOW (ref 44.0–60.0)
pH, Ven: 7.31 (ref 7.250–7.430)
pO2, Ven: <31 mmHg — CL (ref 32.0–45.0)

## 2020-04-06 LAB — URINALYSIS, COMPLETE (UACMP) WITH MICROSCOPIC
Bacteria, UA: NONE SEEN
Bilirubin Urine: NEGATIVE
Glucose, UA: >=500 mg/dL — AB
Ketones, ur: 80 mg/dL — AB
Leukocytes,Ua: NEGATIVE
Nitrite: NEGATIVE
Protein, ur: 30 mg/dL — AB
Specific Gravity, Urine: 1.022 (ref 1.005–1.030)
pH: 5 (ref 5.0–8.0)

## 2020-04-06 LAB — RESPIRATORY PANEL BY RT PCR (FLU A&B, COVID)
Influenza A by PCR: NEGATIVE
Influenza B by PCR: NEGATIVE
SARS Coronavirus 2 by RT PCR: NEGATIVE

## 2020-04-06 LAB — LACTIC ACID, PLASMA: Lactic Acid, Venous: 3.3 mmol/L (ref 0.5–1.9)

## 2020-04-06 LAB — BETA-HYDROXYBUTYRIC ACID: Beta-Hydroxybutyric Acid: >8 mmol/L — ABNORMAL HIGH (ref 0.05–0.27)

## 2020-04-06 LAB — MAGNESIUM: Magnesium: 2.2 mg/dL (ref 1.7–2.4)

## 2020-04-06 MED ORDER — LACTATED RINGERS IV BOLUS: 20.0000 mL/kg | Freq: Once | INTRAVENOUS | Status: DC

## 2020-04-06 MED ORDER — POTASSIUM CHLORIDE 10 MEQ/100ML IV SOLN
10.0000 meq | INTRAVENOUS | Status: AC
  Administered 2020-04-06 – 2020-04-07 (×6): 10 meq via INTRAVENOUS
  Filled 2020-04-06 (×6): qty 100

## 2020-04-06 MED ORDER — DEXTROSE IN LACTATED RINGERS 5 % IV SOLN: INTRAVENOUS | Status: DC

## 2020-04-06 MED ORDER — LACTATED RINGERS IV BOLUS
1000.0000 mL | Freq: Once | INTRAVENOUS | Status: AC
  Administered 2020-04-06: 1000 mL via INTRAVENOUS

## 2020-04-06 MED ORDER — POTASSIUM CHLORIDE 10 MEQ/100ML IV SOLN: 10.0000 meq | INTRAVENOUS | Status: DC

## 2020-04-06 MED ORDER — LACTATED RINGERS IV SOLN: INTRAVENOUS | Status: DC

## 2020-04-06 MED ORDER — DEXTROSE 50 % IV SOLN: 0.0000 mL | INTRAVENOUS | Status: DC | PRN

## 2020-04-06 MED ORDER — POTASSIUM CHLORIDE 20 MEQ PO PACK
40.0000 meq | PACK | Freq: Once | ORAL | Status: AC
  Administered 2020-04-06: 40 meq via ORAL
  Filled 2020-04-06: qty 2

## 2020-04-06 MED ORDER — INSULIN REGULAR(HUMAN) IN NACL 100-0.9 UT/100ML-% IV SOLN
INTRAVENOUS | Status: DC
  Administered 2020-04-07: 8 [IU]/h via INTRAVENOUS
  Filled 2020-04-06: qty 100

## 2020-04-06 MED ORDER — ENOXAPARIN SODIUM 40 MG/0.4ML ~~LOC~~ SOLN
40.0000 mg | SUBCUTANEOUS | Status: DC
  Administered 2020-04-07 – 2020-04-09 (×3): 40 mg via SUBCUTANEOUS
  Filled 2020-04-06 (×3): qty 0.4

## 2020-04-06 NOTE — ED Triage Notes (Signed)
Pt arrives via [facility] with c/o CBG 410 with EMS and HR 110. Pt is afebrile at this time. Per EMS, family stated that pt was altered and pt is answering questions at this time without difficulty and appears in NAD>

## 2020-04-06 NOTE — ED Notes (Signed)
Iv team at bedside. IV team to put in PICC line. ER provider aware. Insulin drip to be started when PICC line places.

## 2020-04-06 NOTE — ED Provider Notes (Addendum)
[facility] Emergency Department Provider Note ____________________________________________   First MD Initiated Contact with Patient 04/06/20 2154     (approximate)  I have reviewed the triage vital signs and the nursing notes.  HISTORY  Chief Complaint Hyperglycemia   HPI Tony Long is a 44 y.o. malewho presents to the ED for evaluation of feeling unwell and vomiting.  Chart review indicates poorly controlled diabetes on insulin and oral agents, polysubstance abuse with previous IVDU.  HTN, HLD. Seen as an outpatient 7 days ago by PCP NP for uncomplicated anterior chest wall abscesses, for which she was prescribed 20 days of Augmentin to finish on 04/19/2020. [name] fully vaccinated for COVID-19.    Patient presents to the ED with a couple days of generalized feeling unwell, poor p.o. intake due to nausea and vomiting of nonbloody nonbilious emesis.  Reports 3-6 episodes of emesis per day.  He denies pain anywhere, including chest pain, headache or abdominal pain.  He reports compliance with antibiotics and insulin.  Reports presyncopal dizziness without syncope.    Past Medical History:  Diagnosis Date  . Alcohol use 02/2020  . Diabetes mellitus without complication (HCC)   . Elevated liver enzymes 02/2020  . Elevated liver enzymes 02/2020  . Hyperlipidemia 02/2020  . Hypertension   . IV drug user   . Vitamin D deficiency 02/2020  . Wound healing, delayed     Patient Active Problem List   Diagnosis Date Noted  . Hemoglobin A1C greater than 9%, indicating poor diabetic control 08/09/2019  . History of delayed wound healing 08/09/2019  . IV drug user 08/09/2019  . Neuropathy 03/29/2019  . Wound of right foot 03/29/2019  . Hypertension 05/17/2018  . 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 Back abscess 03/21/2018  . Opioid use disorder, severe, dependence (HCC) 03/21/2018  . Osteomyelitis (HCC) 03/20/2018  . Chronic hepatitis C  without hepatic coma (HCC) 07/02/2016  . Diabetes (HCC) 03/14/2016    Past Surgical History:  Procedure Laterality Date  . INCISION AND DRAINAGE ABSCESS N/A 03/22/2018   Procedure: INCISION AND DRAINAGE ABSCESS;  Surgeon: Violeta Gelinashompson, Burke, MD;  [facility];  Service: General;  Laterality: N/A;  . TEE WITHOUT CARDIOVERSION  03/22/2018   Procedure: TRANSESOPHAGEAL ECHOCARDIOGRAM (TEE);  Surgeon: Radiologist, Medication, MD;  Location: [facility] OR;  Service: Open Heart Surgery;;    Prior to Admission medications   Medication Sig Start Date End Date Taking? Authorizing Provider  amoxicillin-clavulanate (AUGMENTIN) 875-125 MG tablet Take 1 tablet by mouth 2 (two) times daily for 20 days. 03/30/20 04/19/20 Yes Kallie LocksStroud, Natalie M, FNP  gabapentin (NEURONTIN) 300 MG capsule Take 2 capsule (600 mg= total) by mouth, 3 times a day. 02/29/20  Yes Kallie LocksStroud, Natalie M, FNP  cyclobenzaprine (FLEXERIL) 5 MG tablet Take 1-2 tablets (5-10 mg total) by mouth 3 (three) times daily as needed for muscle spasms. Patient not taking: Reported on 08/08/2019 03/07/18   Evon SlackGaines, Thomas C, PA-C  empagliflozin (JARDIANCE) 10 MG TABS tablet Take 10 mg by mouth daily before breakfast. Patient not taking: Reported on 02/29/2020 08/08/19   Kallie LocksStroud, Natalie M, FNP  glucose blood test strip Use as instructed Patient not taking: Reported on 02/29/2020 03/13/16   Henrietta HooverBernhardt, Linda C, NP  ibuprofen (ADVIL,MOTRIN) 600 MG tablet Take 1 tablet (600 mg total) by mouth every 8 (eight) hours as needed. Patient not taking: Reported on 03/28/2019 10/14/17   Massie MaroonHollis, Lachina M, FNP  Insulin Glargine (LANTUS) 100 UNIT/ML Solostar Pen Inject 50 Units  into the skin daily at 10 pm. 08/08/19   [name], FNP  insulin lispro (HUMALOG) 100 UNIT/ML injection Inject 0.1 mLs (10 Units total) into the skin 3 (three) times daily with meals. 08/08/19   [name], FNP  Lancets [facility] ULTRASOFT) lancets Use as instructed Patient not taking: Reported on 02/29/2020  03/13/16   Henrietta Hoover, NP  lisinopril (ZESTRIL) 5 MG tablet Take 1 tablet (5 mg total) by mouth daily. Patient not taking: Reported on 02/29/2020 08/08/19   [name], FNP  metoprolol tartrate (LOPRESSOR) 50 MG tablet Take 1 tablet (50 mg total) by mouth 2 (two) times daily. Patient not taking: Reported on 02/29/2020 08/08/19   [name], FNP  pravastatin (PRAVACHOL) 40 MG tablet Take 1 tablet (40 mg total) by mouth daily. Patient not taking: Reported on 02/29/2020 08/08/19   [name], FNP  sildenafil (VIAGRA) 25 MG tablet Take 1 tablet (25 mg total) by mouth daily as needed for erectile dysfunction. Patient not taking: Reported on 02/29/2020 02/26/18   [name], FNP  Vitamin D, Ergocalciferol, (DRISDOL) 1.25 MG (50000 UNIT) CAPS capsule Take 1 capsule (50,000 Units total) by mouth every 7 (seven) days. 03/09/20   [name], FNP    Allergies Patient has no known allergies.  Family History  Problem Relation Age of Onset  . Heart disease Father   . Diabetes Father   . Diabetes Paternal Uncle     Social History Social History   Tobacco Use  . Smoking status: Current Every Day Smoker    Packs/day: 1.00  . Smokeless tobacco: Never Used  Vaping Use  . Vaping Use: Never used  Substance Use Topics  . Alcohol use: Not Currently    Comment: seldom  . Drug use: No    Comment: pt mother said hx of use    Review of Systems  Constitutional: No fever/chills.  Positive generalized weakness Eyes: No visual changes. ENT: No sore throat. Cardiovascular: Denies chest pain. Respiratory: Denies shortness of breath. Gastrointestinal: No abdominal pain.  No diarrhea.  No constipation. Positive for nausea and vomiting Genitourinary: Negative for dysuria. Musculoskeletal: Negative for back pain. Skin: Negative for rash. Neurological: Negative for headaches, focal weakness or numbness. ____________________________________________   PHYSICAL EXAM:  VITAL  SIGNS: Vitals:   04/06/20 2159 04/06/20 2232  BP: 129/82   Pulse: (!) 113   Resp: 18   Temp: 98.2 F (36.8 C)   SpO2: 100% 100%      Constitutional: Alert and oriented.  Uncomfortable-appearing, but conversational full sentences. Eyes: Conjunctivae are normal. PERRL. EOMI. Head: Atraumatic. Nose: No congestion/rhinnorhea. Mouth/Throat: Mucous membranes are dry.  Oropharynx non-erythematous. Neck: No stridor. No cervical spine tenderness to palpation. Cardiovascular: Tachycardic rate, regular rhythm. Grossly normal heart sounds.  Good peripheral circulation. Respiratory: Normal respiratory effort.  No retractions. Lungs CTAB. Gastrointestinal: Soft , nondistended. No abdominal bruits. No CVA tenderness  Minimal diffuse tenderness without peritoneal or localizing features. Musculoskeletal: No lower extremity tenderness nor edema.  No joint effusions. No signs of acute trauma. Neurologic:  Normal speech and language. No gross focal neurologic deficits are appreciated. No gait instability noted. Skin:  Skin is warm, dry and intact. No rash noted. Healing abscesses to his anterior chest wall laterally, near his shoulders.  Both are draining without induration or fluctuance to suggest undrained areas. Psychiatric: Mood and affect are normal. Speech and behavior are normal.  ____________________________________________   LABS (all labs ordered are listed, but  only abnormal results are displayed)  Labs Reviewed  BLOOD GAS, VENOUS - Abnormal; Notable for the following components:      Result Value   pCO2, Ven 33 (*)    pO2, Ven <31.0 (*)    Bicarbonate 16.6 (*)    Acid-base deficit 8.7 (*)    All other components within normal limits  CBC WITH DIFFERENTIAL/PLATELET - Abnormal; Notable for the following components:   WBC 14.6 (*)    RBC 3.43 (*)    Hemoglobin 12.1 (*)    HCT 34.3 (*)    MCH 35.3 (*)    Platelets 101 (*)    Neutro Abs 13.5 (*)    Lymphs Abs 0.6 (*)    Abs  Immature Granulocytes 0.08 (*)    All other components within normal limits  COMPREHENSIVE METABOLIC PANEL - Abnormal; Notable for the following components:   Sodium 126 (*)    Potassium 3.1 (*)    Chloride 74 (*)    CO2 15 (*)    Glucose, Bld 441 (*)    Creatinine, Ser 1.64 (*)    Calcium 8.5 (*)    AST 76 (*)    ALT 45 (*)    Alkaline Phosphatase 191 (*)    Total Bilirubin 3.4 (*)    GFR, Estimated 50 (*)    Anion gap 37 (*)    All other components within normal limits  LACTIC ACID, PLASMA - Abnormal; Notable for the following components:   Lactic Acid, Venous 3.3 (*)    All other components within normal limits  RESPIRATORY PANEL BY RT PCR (FLU A&B, COVID)  MAGNESIUM  BETA-HYDROXYBUTYRIC ACID  URINALYSIS, COMPLETE (UACMP) WITH MICROSCOPIC  URINALYSIS, ROUTINE W REFLEX MICROSCOPIC   ____________________________________________  12 Lead EKG  Sinus rhythm, rate of 115 bpm.  Normal axis, normal intervals.  No evidence of acute ischemia. ____________________________________________  RADIOLOGY  ED MD interpretation: 2 view CXR reviewed without evidence of cardiopulmonary pathology.  Official radiology report(s): DG Chest 2 View  Result Date: 04/06/2020 CLINICAL DATA:  44 year old male with hyperglycemia. EXAM: CHEST - 2 VIEW COMPARISON:  Chest radiograph dated 03/30/2018 and CT dated 03/30/2018 FINDINGS: No focal consolidation, pleural effusion, or pneumothorax. The cardiac silhouette is within limits. No acute osseous pathology. IMPRESSION: No active cardiopulmonary disease. Electronically Signed   By: Elgie Collard M.D.   On: 04/06/2020 22:50    ____________________________________________   PROCEDURES and INTERVENTIONS  Procedure(s) performed (including Critical Care):  .1-3 Lead EKG Interpretation Performed by: Delton Prairie, MD Authorized by: Delton Prairie, MD     Interpretation: abnormal     ECG rate:  115   ECG rate assessment: tachycardic   .Critical  Care Performed by: Delton Prairie, MD Authorized by: Delton Prairie, MD   Critical care provider statement:    Critical care time (minutes):  45   Critical care was necessary to treat or prevent imminent or life-threatening deterioration of the following conditions:  Metabolic crisis and endocrine crisis   Critical care was time spent personally by me on the following activities:  Discussions with consultants, evaluation of patient's response to treatment, examination of patient, ordering and performing treatments and interventions, ordering and review of laboratory studies, ordering and review of radiographic studies, pulse oximetry, re-evaluation of patient's condition, obtaining history from patient or surrogate and review of old charts Ultrasound ED Peripheral IV (Provider)  Date/Time: 04/06/2020 11:33 PM Performed by: Delton Prairie, MD Authorized by: Delton Prairie, MD   Procedure details:  Indications: hydration, hypotension and poor IV access     Skin Prep: chlorhexidine gluconate     Location: left upper arm.   Angiocath:  20 G   Bedside Ultrasound Guided: Yes     Images: not archived     Patient tolerated procedure without complications: Yes     Dressing applied: Yes      Medications  potassium chloride 10 mEq in 100 mL IVPB (has no administration in time range)  insulin regular, human (MYXREDLIN) 100 units/ 100 mL infusion (has no administration in time range)  lactated ringers infusion (has no administration in time range)  dextrose 5 % in lactated ringers infusion (has no administration in time range)  dextrose 50 % solution 0-50 mL (has no administration in time range)  lactated ringers bolus 1,000 mL (1,000 mLs Intravenous New Bag/Given 04/06/20 2230)  potassium chloride (KLOR-CON) packet 40 mEq (40 mEq Oral Given 04/06/20 2318)    ____________________________________________   MDM / ED COURSE  44 year old brittle diabetic presents to the ED with evidence of DKA  precipitated by body wall abscess being treated as an outpatient, requiring medical admission.  Patient presented tachycardic, but otherwise hemodynamically stable.  Exam with stigmata of dehydration in an uncomfortable-appearing patient who has no evidence of distress, trauma or neurovascular deficits.  Blood work demonstrates metabolic derangements consistent with DKA.  Initiated fluid bolus, potassium repletion and insulin drip without bolus in the ED, will admit to hospitalist medicine for further work-up and management.  Clinical Course as of Apr 06 2321  Fri Apr 06, 2020  2321 Spoke with admitting hospitalist, who agrees to admit the patient.  We discussed the addition of vancomycin to his antibiotic regimen for the possible treatment of MRSA, we discussed his overall clinical improvement of abscesses no indication of failure of outpatient management   [DS]    Clinical Course User Index [DS] Delton Prairie, MD     ____________________________________________   FINAL CLINICAL IMPRESSION(S) / ED DIAGNOSES  Final diagnoses:  Diabetic ketoacidosis without coma associated with type 2 diabetes mellitus (HCC)  Dehydration  AKI (acute kidney injury) (HCC)  Hypokalemia  Sinus tachycardia  Lactic acidosis     ED Discharge Orders    None       Mireyah Chervenak   Note:  This document was prepared using Dragon voice recognition software and may include unintentional dictation errors.   Delton Prairie, MD 04/06/20 0626    Delton Prairie, MD 04/06/20 2322    Delton Prairie, MD 04/06/20 [phone]

## 2020-04-06 NOTE — ED Notes (Signed)
Date and time results received: 04/06/20    Test: Lactic Acid Critical Value: 3.3  Name of Provider Notified: Dr. Katrinka Blazing  Orders Received? Or Actions Taken?: Provider notified

## 2020-04-07 ENCOUNTER — Inpatient Hospital Stay: Payer: Medicaid Other

## 2020-04-07 DIAGNOSIS — E1110 Type 2 diabetes mellitus with ketoacidosis without coma: Principal | ICD-10-CM

## 2020-04-07 DIAGNOSIS — L0291 Cutaneous abscess, unspecified: Secondary | ICD-10-CM

## 2020-04-07 DIAGNOSIS — E876 Hypokalemia: Secondary | ICD-10-CM

## 2020-04-07 LAB — BASIC METABOLIC PANEL
Anion gap: 31 — ABNORMAL HIGH (ref 5–15)
Anion gap: 28 — ABNORMAL HIGH (ref 5–15)
Anion gap: 18 — ABNORMAL HIGH (ref 5–15)
Anion gap: 22 — ABNORMAL HIGH (ref 5–15)
Anion gap: 16 — ABNORMAL HIGH (ref 5–15)
Anion gap: 16 — ABNORMAL HIGH (ref 5–15)
BUN: 15 mg/dL (ref 6–20)
BUN: 14 mg/dL (ref 6–20)
BUN: 12 mg/dL (ref 6–20)
BUN: 11 mg/dL (ref 6–20)
BUN: 10 mg/dL (ref 6–20)
BUN: 12 mg/dL (ref 6–20)
CO2: 17 mmol/L — ABNORMAL LOW (ref 22–32)
CO2: 19 mmol/L — ABNORMAL LOW (ref 22–32)
CO2: 21 mmol/L — ABNORMAL LOW (ref 22–32)
CO2: 14 mmol/L — ABNORMAL LOW (ref 22–32)
CO2: 18 mmol/L — ABNORMAL LOW (ref 22–32)
CO2: 23 mmol/L (ref 22–32)
Calcium: 7.8 mg/dL — ABNORMAL LOW (ref 8.9–10.3)
Calcium: 8 mg/dL — ABNORMAL LOW (ref 8.9–10.3)
Calcium: 7.2 mg/dL — ABNORMAL LOW (ref 8.9–10.3)
Calcium: 7.1 mg/dL — ABNORMAL LOW (ref 8.9–10.3)
Calcium: 7 mg/dL — ABNORMAL LOW (ref 8.9–10.3)
Calcium: 8 mg/dL — ABNORMAL LOW (ref 8.9–10.3)
Chloride: 78 mmol/L — ABNORMAL LOW (ref 98–111)
Chloride: 84 mmol/L — ABNORMAL LOW (ref 98–111)
Chloride: 92 mmol/L — ABNORMAL LOW (ref 98–111)
Chloride: 94 mmol/L — ABNORMAL LOW (ref 98–111)
Chloride: 98 mmol/L (ref 98–111)
Chloride: 95 mmol/L — ABNORMAL LOW (ref 98–111)
Creatinine, Ser: 1.33 mg/dL — ABNORMAL HIGH (ref 0.61–1.24)
Creatinine, Ser: 1.35 mg/dL — ABNORMAL HIGH (ref 0.61–1.24)
Creatinine, Ser: 0.9 mg/dL (ref 0.61–1.24)
Creatinine, Ser: 0.89 mg/dL (ref 0.61–1.24)
Creatinine, Ser: 1.05 mg/dL (ref 0.61–1.24)
Creatinine, Ser: 0.93 mg/dL (ref 0.61–1.24)
GFR, Estimated: >60 mL/min (ref >60)
GFR, Estimated: >60 mL/min (ref >60)
GFR, Estimated: >60 mL/min (ref >60)
GFR, Estimated: >60 mL/min (ref >60)
GFR, Estimated: >60 mL/min (ref >60)
GFR, Estimated: >60 mL/min (ref >60)
Glucose, Bld: 454 mg/dL — ABNORMAL HIGH (ref 70–99)
Glucose, Bld: 310 mg/dL — ABNORMAL HIGH (ref 70–99)
Glucose, Bld: 224 mg/dL — ABNORMAL HIGH (ref 70–99)
Glucose, Bld: 286 mg/dL — ABNORMAL HIGH (ref 70–99)
Glucose, Bld: 589 mg/dL (ref 70–99)
Glucose, Bld: 161 mg/dL — ABNORMAL HIGH (ref 70–99)
Potassium: 3.3 mmol/L — ABNORMAL LOW (ref 3.5–5.1)
Potassium: 2.4 mmol/L — CL (ref 3.5–5.1)
Potassium: 3 mmol/L — ABNORMAL LOW (ref 3.5–5.1)
Potassium: 3.4 mmol/L — ABNORMAL LOW (ref 3.5–5.1)
Potassium: 2.6 mmol/L — CL (ref 3.5–5.1)
Potassium: 3 mmol/L — ABNORMAL LOW (ref 3.5–5.1)
Sodium: 126 mmol/L — ABNORMAL LOW (ref 135–145)
Sodium: 131 mmol/L — ABNORMAL LOW (ref 135–145)
Sodium: 131 mmol/L — ABNORMAL LOW (ref 135–145)
Sodium: 130 mmol/L — ABNORMAL LOW (ref 135–145)
Sodium: 132 mmol/L — ABNORMAL LOW (ref 135–145)
Sodium: 134 mmol/L — ABNORMAL LOW (ref 135–145)

## 2020-04-07 LAB — URINE DRUG SCREEN, QUALITATIVE (ARMC ONLY)
Amphetamines, Ur Screen: NOT DETECTED
Barbiturates, Ur Screen: NOT DETECTED
Benzodiazepine, Ur Scrn: NOT DETECTED
Cannabinoid 50 Ng, Ur ~~LOC~~: NOT DETECTED
Cocaine Metabolite,Ur ~~LOC~~: NOT DETECTED
MDMA (Ecstasy)Ur Screen: NOT DETECTED
Methadone Scn, Ur: NOT DETECTED
Opiate, Ur Screen: NOT DETECTED
Phencyclidine (PCP) Ur S: NOT DETECTED
Tricyclic, Ur Screen: NOT DETECTED

## 2020-04-07 LAB — LACTIC ACID, PLASMA
Lactic Acid, Venous: 2.7 mmol/L (ref 0.5–1.9)
Lactic Acid, Venous: 3.2 mmol/L (ref 0.5–1.9)
Lactic Acid, Venous: 1.8 mmol/L (ref 0.5–1.9)

## 2020-04-07 LAB — MRSA PCR SCREENING: MRSA by PCR: POSITIVE — AB

## 2020-04-07 LAB — GLUCOSE, CAPILLARY
Glucose-Capillary: 130 mg/dL — ABNORMAL HIGH (ref 70–99)
Glucose-Capillary: 148 mg/dL — ABNORMAL HIGH (ref 70–99)
Glucose-Capillary: 164 mg/dL — ABNORMAL HIGH (ref 70–99)
Glucose-Capillary: 168 mg/dL — ABNORMAL HIGH (ref 70–99)
Glucose-Capillary: 173 mg/dL — ABNORMAL HIGH (ref 70–99)
Glucose-Capillary: 184 mg/dL — ABNORMAL HIGH (ref 70–99)
Glucose-Capillary: 202 mg/dL — ABNORMAL HIGH (ref 70–99)
Glucose-Capillary: 224 mg/dL — ABNORMAL HIGH (ref 70–99)
Glucose-Capillary: 250 mg/dL — ABNORMAL HIGH (ref 70–99)
Glucose-Capillary: 263 mg/dL — ABNORMAL HIGH (ref 70–99)
Glucose-Capillary: 290 mg/dL — ABNORMAL HIGH (ref 70–99)
Glucose-Capillary: 317 mg/dL — ABNORMAL HIGH (ref 70–99)
Glucose-Capillary: 367 mg/dL — ABNORMAL HIGH (ref 70–99)
Glucose-Capillary: 419 mg/dL — ABNORMAL HIGH (ref 70–99)
Glucose-Capillary: 472 mg/dL — ABNORMAL HIGH (ref 70–99)
Glucose-Capillary: 558 mg/dL (ref 70–99)

## 2020-04-07 LAB — BETA-HYDROXYBUTYRIC ACID
Beta-Hydroxybutyric Acid: >8 mmol/L — ABNORMAL HIGH (ref 0.05–0.27)
Beta-Hydroxybutyric Acid: 7.19 mmol/L — ABNORMAL HIGH (ref 0.05–0.27)
Beta-Hydroxybutyric Acid: 7.39 mmol/L — ABNORMAL HIGH (ref 0.05–0.27)

## 2020-04-07 LAB — HIV ANTIBODY (ROUTINE TESTING W REFLEX): HIV Screen 4th Generation wRfx: NONREACTIVE

## 2020-04-07 LAB — MAGNESIUM: Magnesium: 1.4 mg/dL — ABNORMAL LOW (ref 1.7–2.4)

## 2020-04-07 LAB — PHOSPHORUS: Phosphorus: <1 mg/dL — CL (ref 2.5–4.6)

## 2020-04-07 MED ORDER — CHLORHEXIDINE GLUCONATE CLOTH 2 % EX PADS
6.0000 | MEDICATED_PAD | Freq: Every day | CUTANEOUS | Status: DC
  Administered 2020-04-08: 6 via TOPICAL

## 2020-04-07 MED ORDER — DEXTROSE-NACL 5-0.9 % IV SOLN: INTRAVENOUS | Status: DC

## 2020-04-07 MED ORDER — MAGNESIUM SULFATE 4 GM/100ML IV SOLN
4.0000 g | Freq: Once | INTRAVENOUS | Status: AC
  Administered 2020-04-07: 4 g via INTRAVENOUS
  Filled 2020-04-07: qty 100

## 2020-04-07 MED ORDER — SODIUM CHLORIDE 0.9 % IV SOLN: INTRAVENOUS | Status: DC

## 2020-04-07 MED ORDER — VANCOMYCIN HCL 1500 MG/300ML IV SOLN
1500.0000 mg | Freq: Once | INTRAVENOUS | Status: AC
  Administered 2020-04-07: 1500 mg via INTRAVENOUS
  Filled 2020-04-07: qty 300

## 2020-04-07 MED ORDER — SODIUM CHLORIDE 0.9 % IV BOLUS
1000.0000 mL | Freq: Once | INTRAVENOUS | Status: AC
  Administered 2020-04-07: 1000 mL via INTRAVENOUS

## 2020-04-07 MED ORDER — SODIUM CHLORIDE 0.9% FLUSH
10.0000 mL | Freq: Two times a day (BID) | INTRAVENOUS | Status: DC
  Administered 2020-04-07 – 2020-04-09 (×5): 10 mL

## 2020-04-07 MED ORDER — POTASSIUM CHLORIDE CRYS ER 20 MEQ PO TBCR
20.0000 meq | EXTENDED_RELEASE_TABLET | Freq: Once | ORAL | Status: AC
  Administered 2020-04-07: 20 meq via ORAL
  Filled 2020-04-07: qty 1

## 2020-04-07 MED ORDER — INSULIN REGULAR(HUMAN) IN NACL 100-0.9 UT/100ML-% IV SOLN
INTRAVENOUS | Status: DC
  Filled 2020-04-07: qty 100

## 2020-04-07 MED ORDER — POTASSIUM CHLORIDE 10 MEQ/50ML IV SOLN
10.0000 meq | INTRAVENOUS | Status: AC
  Administered 2020-04-07 (×4): 10 meq via INTRAVENOUS
  Filled 2020-04-07 (×4): qty 50

## 2020-04-07 MED ORDER — POTASSIUM CHLORIDE 10 MEQ/100ML IV SOLN
10.0000 meq | INTRAVENOUS | Status: DC
  Filled 2020-04-07 (×2): qty 100

## 2020-04-07 MED ORDER — MUPIROCIN 2 % EX OINT
1.0000 "application " | TOPICAL_OINTMENT | Freq: Two times a day (BID) | CUTANEOUS | Status: DC
  Administered 2020-04-07 – 2020-04-09 (×4): 1 via NASAL
  Filled 2020-04-07 (×2): qty 22

## 2020-04-07 MED ORDER — VANCOMYCIN HCL IN DEXTROSE 1-5 GM/200ML-% IV SOLN: 1000.0000 mg | Freq: Once | INTRAVENOUS | Status: DC

## 2020-04-07 MED ORDER — VANCOMYCIN HCL 1250 MG/250ML IV SOLN
1250.0000 mg | Freq: Two times a day (BID) | INTRAVENOUS | Status: DC
  Administered 2020-04-07 – 2020-04-09 (×4): 1250 mg via INTRAVENOUS
  Filled 2020-04-07 (×8): qty 250

## 2020-04-07 MED ORDER — POTASSIUM CHLORIDE CRYS ER 20 MEQ PO TBCR
40.0000 meq | EXTENDED_RELEASE_TABLET | Freq: Once | ORAL | Status: AC
  Administered 2020-04-07: 40 meq via ORAL
  Filled 2020-04-07: qty 2

## 2020-04-07 MED ORDER — POTASSIUM CHLORIDE CRYS ER 20 MEQ PO TBCR
40.0000 meq | EXTENDED_RELEASE_TABLET | Freq: Once | ORAL | Status: AC
  Administered 2020-04-07: 40 meq via ORAL

## 2020-04-07 MED ORDER — SODIUM CHLORIDE 0.9% FLUSH: 10.0000 mL | INTRAVENOUS | Status: DC | PRN

## 2020-04-07 MED ORDER — CHLORHEXIDINE GLUCONATE CLOTH 2 % EX PADS
6.0000 | MEDICATED_PAD | Freq: Every day | CUTANEOUS | Status: DC
  Filled 2020-04-07: qty 6

## 2020-04-07 MED ORDER — GABAPENTIN 300 MG PO CAPS
600.0000 mg | ORAL_CAPSULE | Freq: Three times a day (TID) | ORAL | Status: DC
  Administered 2020-04-07 – 2020-04-09 (×7): 600 mg via ORAL
  Filled 2020-04-07 (×7): qty 2

## 2020-04-07 MED ORDER — POTASSIUM CHLORIDE 10 MEQ/100ML IV SOLN
10.0000 meq | INTRAVENOUS | Status: AC
  Administered 2020-04-07 – 2020-04-08 (×4): 10 meq via INTRAVENOUS
  Filled 2020-04-07 (×4): qty 100

## 2020-04-07 MED ORDER — VANCOMYCIN HCL 1250 MG/250ML IV SOLN: 1250.0000 mg | INTRAVENOUS | Status: DC

## 2020-04-07 NOTE — ED Notes (Signed)
As per provider, insulin gtt stopped. Provider messaged to let know the following:  so blood sugar is 250. he was switched to the D5NS at 155ml/hr. His bolus of NS is infusing and I have stopped his insulin gtt. Is there anything else you want changed? and when do we want to recollect a potassium

## 2020-04-07 NOTE — ED Notes (Signed)
Pt states that he drinks 1/5 of liquor daily and that he thinks he "may be time to give it up". Educated pt that he will only quit drinking when he is really ready to. Pt states that he's been a "drug addict and alcoholic" for a while. Pt states he is now clean from recreational drugs.

## 2020-04-07 NOTE — H&P (Signed)
History and Physical    Arden Tinoco OXB:[id number] DOB: 1975/12/23 DOA: 04/06/2020  PCP: [name], FNP  Patient coming from: Home  I have personally briefly reviewed patient's old medical records in [facility]  Chief Complaint:   HPI: Tony Long is a 44 y.o. male with medical history significant for uncontrolled type 2 diabetes, history of chronic hepatitis C, hypertension, history of endocarditis, osteomyelitis and IV drug use who presents with concerns of feeling unwell for the past week and a half. States he has been feeling more fatigue and lethargic.  Also has been vomiting with decreased p.o. intake.  About 5 days ago he also has been feeling more dizzy and unsteady and fell face first hitting the right side of his face.  He had some blurry vision after that.   He he also recently developed anterior chest wall abscess adjacent to bilateral axillary area and was prescribed 20 days of Augmentin by PCP on 10/1.  These abscesses has been purulent and he has been draining them himself at home. Denies any fever. Patient has uncontrolled type 2 diabetes since has not been consistent with taking his insulin.  He denies any recent IV drug use but does continue to use tobacco.  ED Course: He was afebrile, tachycardic, normotensive on ambient air. CBC shows leukocytosis of 14.6, mild anemia of 12.1.  Lactic acid of 3.3.  Platelet of 101. Sodium 126 but with glucose of 414, anion gap of 37, pH of 7.31, bicarb of 16. Creatinine also shows AKI with creatinine of 1.64 from a prior of 0.56.  Review of Systems: Constitutional: No Weight Change, No Fever ENT/Mouth: No sore throat, No Rhinorrhea Eyes: No Eye Pain, No Vision Changes Cardiovascular: No Chest Pain, no SOB Respiratory: No Cough, No Sputum, No Wheezing, no Dyspnea  Gastrointestinal: No Nausea, + Vomiting, No Diarrhea, No Constipation, No Pain Genitourinary: no Urinary Incontinence Musculoskeletal: No Arthralgias,  No Myalgias Skin: No Skin Lesions, No Pruritus, Neuro: + Weakness, No Numbness,  No Loss of Consciousness, + Syncope Psych: No Anxiety/Panic, No Depression, + decrease appetite Heme/Lymph: No Bruising, No Bleeding  Past Medical History:  Diagnosis Date  . Alcohol use 02/2020  . Diabetes mellitus without complication (HCC)   . Elevated liver enzymes 02/2020  . Elevated liver enzymes 02/2020  . Hyperlipidemia 02/2020  . Hypertension   . IV drug user   . Vitamin D deficiency 02/2020  . Wound healing, delayed     Past Surgical History:  Procedure Laterality Date  . INCISION AND DRAINAGE ABSCESS N/A 03/22/2018   Procedure: INCISION AND DRAINAGE ABSCESS;  Surgeon: Violeta Gelinas, MD;  [facility]: [facility] OR;  Service: General;  Laterality: N/A;  . TEE WITHOUT CARDIOVERSION  03/22/2018   Procedure: TRANSESOPHAGEAL ECHOCARDIOGRAM (TEE);  Surgeon: Radiologist, Medication, MD;  Location: [facility] OR;  Service: Open Heart Surgery;;     reports that he has been smoking. He has been smoking about 1.00 pack per day. He has never used smokeless tobacco. He reports previous alcohol use. He reports that he does not use drugs. Social History  No Known Allergies  Family History  Problem Relation Age of Onset  . Heart disease Father   . Diabetes Father   . Diabetes Paternal Uncle      Prior to Admission medications   Medication Sig Start Date End Date Taking? Authorizing Provider  amoxicillin-clavulanate (AUGMENTIN) 875-125 MG tablet Take 1 tablet by mouth 2 (two) times daily for 20 days. 03/30/20 04/19/20 Yes Raliegh Ip  M, FNP  gabapentin (NEURONTIN) 300 MG capsule Take 2 capsule (600 mg= total) by mouth, 3 times a day. 02/29/20  Yes [name], FNP  Insulin Glargine (LANTUS) 100 UNIT/ML Solostar Pen Inject 50 Units into the skin daily at 10 pm. Patient taking differently: Inject 30 Units into the skin daily at 10 pm.  08/08/19  Yes [name], FNP  insulin lispro (HUMALOG) 100 UNIT/ML  injection Inject 0.1 mLs (10 Units total) into the skin 3 (three) times daily with meals. Patient taking differently: Inject 10 Units into the skin 3 (three) times daily with meals. PER SLIDING SCALE 08/08/19  Yes [name], FNP  cyclobenzaprine (FLEXERIL) 5 MG tablet Take 1-2 tablets (5-10 mg total) by mouth 3 (three) times daily as needed for muscle spasms. Patient not taking: Reported on 08/08/2019 03/07/18   Evon Slack, PA-C  empagliflozin (JARDIANCE) 10 MG TABS tablet Take 10 mg by mouth daily before breakfast. Patient not taking: Reported on 02/29/2020 08/08/19   [name], FNP  glucose blood test strip Use as instructed Patient not taking: Reported on 02/29/2020 03/13/16   Henrietta Hoover, NP  ibuprofen (ADVIL,MOTRIN) 600 MG tablet Take 1 tablet (600 mg total) by mouth every 8 (eight) hours as needed. Patient not taking: Reported on 03/28/2019 10/14/17   [name], FNP  Lancets [facility] ULTRASOFT) lancets Use as instructed Patient not taking: Reported on 02/29/2020 03/13/16   Henrietta Hoover, NP  lisinopril (ZESTRIL) 5 MG tablet Take 1 tablet (5 mg total) by mouth daily. Patient not taking: Reported on 02/29/2020 08/08/19   [name], FNP  metoprolol tartrate (LOPRESSOR) 50 MG tablet Take 1 tablet (50 mg total) by mouth 2 (two) times daily. Patient not taking: Reported on 02/29/2020 08/08/19   [name], FNP  pravastatin (PRAVACHOL) 40 MG tablet Take 1 tablet (40 mg total) by mouth daily. Patient not taking: Reported on 02/29/2020 08/08/19   [name], FNP  sildenafil (VIAGRA) 25 MG tablet Take 1 tablet (25 mg total) by mouth daily as needed for erectile dysfunction. Patient not taking: Reported on 02/29/2020 02/26/18   [name], FNP  Vitamin D, Ergocalciferol, (DRISDOL) 1.25 MG (50000 UNIT) CAPS capsule Take 1 capsule (50,000 Units total) by mouth every 7 (seven) days. Patient not taking: Reported on 04/06/2020 03/09/20   [name], FNP     Physical Exam: Vitals:   04/06/20 2156 04/06/20 2159 04/06/20 2232  BP:  129/82   Pulse:  (!) 113   Resp:  18   Temp:  98.2 F (36.8 C)   TempSrc:  Oral   SpO2:  100% 100%  Weight: 68 kg    Height: 5\' 11"  (1.803 m)      Constitutional: NAD, calm, comfortable, ill-appearing male sitting at 40 degree incline in bed Vitals:   04/06/20 2156 04/06/20 2159 04/06/20 2232  BP:  129/82   Pulse:  (!) 113   Resp:  18   Temp:  98.2 F (36.8 C)   TempSrc:  Oral   SpO2:  100% 100%  Weight: 68 kg    Height: 5\' 11"  (1.803 m)     Eyes: PERRL, lids and conjunctivae normal ENMT: Mucous membranes are moist.  Neck: normal, supple Respiratory: clear to auscultation bilaterally, no wheezing, no crackles. Normal respiratory effort. No accessory muscle use.  Cardiovascular: Regular rate and rhythm, no murmurs / rubs / gallops. No extremity edema.  Abdomen: no tenderness, no masses  palpated. . Bowel sounds positive.  Musculoskeletal: no clubbing / cyanosis. No joint deformity upper and lower extremities. Good ROM, no contractures. Normal muscle tone.  Skin: healing wound on right anterior chest adjacent to right axillary region.  Indurated wound to left anterior chest adjacent to left axillary area with purulent drainage easily expressed and has surrounding erythema. Neurologic: CN 2-12 grossly intact. Sensation intact, DTR normal. Strength 5/5 in all 4.  Psychiatric: Normal judgment and insight. Alert and oriented x 3. Normal mood.     Labs on Admission: I have personally reviewed following labs and imaging studies  CBC: Recent Labs  Lab 04/06/20 2217  WBC 14.6*  NEUTROABS 13.5*  HGB 12.1*  HCT 34.3*  MCV 100.0  PLT 101*   Basic Metabolic Panel: Recent Labs  Lab 04/06/20 2217  NA 126*  K 3.1*  CL 74*  CO2 15*  GLUCOSE 441*  BUN 16  CREATININE 1.64*  CALCIUM 8.5*  MG 2.2   GFR: Estimated Creatinine Clearance: 55.9 mL/min (A) (by C-G formula based on SCr of 1.64 mg/dL  (H)). Liver Function Tests: Recent Labs  Lab 04/06/20 2217  AST 76*  ALT 45*  ALKPHOS 191*  BILITOT 3.4*  PROT 8.1  ALBUMIN 3.6   No results for input(s): LIPASE, AMYLASE in the last 168 hours. No results for input(s): AMMONIA in the last 168 hours. Coagulation Profile: No results for input(s): INR, PROTIME in the last 168 hours. Cardiac Enzymes: No results for input(s): CKTOTAL, CKMB, CKMBINDEX, TROPONINI in the last 168 hours. BNP (last 3 results) No results for input(s): PROBNP in the last 8760 hours. HbA1C: No results for input(s): HGBA1C in the last 72 hours. CBG: No results for input(s): GLUCAP in the last 168 hours. Lipid Profile: No results for input(s): CHOL, HDL, LDLCALC, TRIG, CHOLHDL, LDLDIRECT in the last 72 hours. Thyroid Function Tests: No results for input(s): TSH, T4TOTAL, FREET4, T3FREE, THYROIDAB in the last 72 hours. Anemia Panel: No results for input(s): VITAMINB12, FOLATE, FERRITIN, TIBC, IRON, RETICCTPCT in the last 72 hours. Urine analysis:    Component Value Date/Time   COLORURINE YELLOW (A) 04/06/2020 2320   APPEARANCEUR HAZY (A) 04/06/2020 2320   LABSPEC 1.022 04/06/2020 2320   PHURINE 5.0 04/06/2020 2320   GLUCOSEU >=500 (A) 04/06/2020 2320   HGBUR MODERATE (A) 04/06/2020 2320   BILIRUBINUR NEGATIVE 04/06/2020 2320   BILIRUBINUR neg 02/29/2020 0918   KETONESUR 80 (A) 04/06/2020 2320   PROTEINUR 30 (A) 04/06/2020 2320   UROBILINOGEN 1.0 02/29/2020 0918   UROBILINOGEN 1.0 12/11/2016 1020   NITRITE NEGATIVE 04/06/2020 2320   LEUKOCYTESUR NEGATIVE 04/06/2020 2320    Radiological Exams on Admission: DG Chest 2 View  Result Date: 04/06/2020 CLINICAL DATA:  44 year old male with hyperglycemia. EXAM: CHEST - 2 VIEW COMPARISON:  Chest radiograph dated 03/30/2018 and CT dated 03/30/2018 FINDINGS: No focal consolidation, pleural effusion, or pneumothorax. The cardiac silhouette is within limits. No acute osseous pathology. IMPRESSION: No active  cardiopulmonary disease. Electronically Signed   By: Elgie Collard M.D.   On: 04/06/2020 22:50   [place] EKG SITE RITE  Result Date: 04/06/2020 If Site Rite image not attached, placement could not be confirmed due to current cardiac rhythm.     Assessment/Plan  DKA with uncontrolled insulin-dependent type 2 diabetes Last hemoglobin A1c in September of 9.1 replete potassium as needed continue insulin gtt with goal of 140-180 and AG <12 IV NS until BG <250, then switch to D5 1/2 NS  BMP q4hr  keep  NPO   Chest wall/axillary abscesses with cellulitis Start IV vancomycin Blood and wound cultures pending  Hypertension controlled. Not taking any antihypertensives  DVT prophylaxis:.Lovenox Code Status: Full Family Communication: Plan discussed with patient at bedside  disposition Plan: Home with at least 2 midnight stays  Consults called:  Admission status: inpatient  Status is: Inpatient  Remains inpatient appropriate because:IV treatments appropriate due to intensity of illness or inability to take PO   Dispo: The patient is from: Home              Anticipated d/c is to: Home              Anticipated d/c date is: 3 days              Patient currently is not medically stable to d/c.         Anselm Junglinghing T Detravion Tester DO [facility]   If 7PM-7AM, please contact night-coverage [email or web address]   04/07/2020, 12:23 AM

## 2020-04-07 NOTE — ED Notes (Signed)
Provider notified of BP of 133/95

## 2020-04-07 NOTE — Progress Notes (Addendum)
PROGRESS NOTE    Tony Long  WJX:[id number] DOB: Jun 03, 1976 DOA: 04/06/2020 PCP: [name], FNP   Assessment & Plan:   Principal Problem:   DKA (diabetic ketoacidosis) (HCC) Active Problems:   Hypertension   Abscess  DKA: continue on insulin drip until anion gap is closed. Anion gap is 22 currently.   DM2: poorly controlled. Continue on insulin drip   Anterior chest wall abscess: draining already. Wound & blood cxs pending. Continue on IV vanco  Hx of HCV: w/ hx IV drug abuse & endocarditis. Urine drug screen pending   Transaminitis: likely secondary to hx of HCV. Will continue to monitor   Hypokalemia: KCl repleted. Will continue to monitor  Hypomagnesemia: mg sulfate ordered. Will continue to monitor   Thrombocytopenia: etiology unclear. Will continue to monitor   HTN: not on any anti-HTN meds    DVT prophylaxis: lovenox  Code Status: full  Family Communication:  Disposition Plan: depends on PT/OT recs   Status is: Inpatient  Remains inpatient appropriate because:Persistent severe electrolyte disturbances   Dispo: The patient is from: Home              Anticipated d/c is to: Home vs SNF               Anticipated d/c date is: 3 days              Patient currently is not medically stable to d/c.    Consultants:     Procedures:   Antimicrobials: vanco    Subjective: Pt c/o being hungry   Objective: Vitals:   04/07/20 0300 04/07/20 0500 04/07/20 0530 04/07/20 0637  BP: (!) 134/93  113/81 (!) 133/95  Pulse: 100 (!) 102 96 (!) 110  Resp: 14   16  Temp:      TempSrc:      SpO2: 99% 97% 97% 98%  Weight:      Height:        Intake/Output Summary (Last 24 hours) at 04/07/2020 0810 Last data filed at 04/07/2020 2130 Gross per 24 hour  Intake 3421.69 ml  Output 1940 ml  Net 1481.69 ml   Filed Weights   04/06/20 2156  Weight: 68 kg    Examination:  General exam: Appears lethargic  Respiratory system: Clear to auscultation.  Respiratory effort normal. Cardiovascular system: S1 & S2 +. No rubs, gallops or clicks.  Gastrointestinal system: Abdomen is nondistended, soft and nontender.  Normal bowel sounds heard. Central nervous system: Appears lethargic. Moves all 4 extremities  Psychiatry: Judgement and insight appear normal. Flat mood and affect.     Data Reviewed: I have personally reviewed following labs and imaging studies  CBC: Recent Labs  Lab 04/06/20 2217  WBC 14.6*  NEUTROABS 13.5*  HGB 12.1*  HCT 34.3*  MCV 100.0  PLT 101*   Basic Metabolic Panel: Recent Labs  Lab 04/06/20 2217 04/07/20 0046 04/07/20 0354 04/07/20 0631  NA 126* 126* 131*  --   K 3.1* 3.3* 2.4*  --   CL 74* 78* 84*  --   CO2 15* 17* 19*  --   GLUCOSE 441* 454* 310*  --   BUN 16 15 14   --   CREATININE 1.64* 1.33* 1.35*  --   CALCIUM 8.5* 7.8* 8.0*  --   MG 2.2  --   --  1.4*  PHOS  --   --   --  <1.0*   GFR: Estimated Creatinine Clearance: 67.9 mL/min (A) (by C-G formula  based on SCr of 1.35 mg/dL (H)). Liver Function Tests: Recent Labs  Lab 04/06/20 2217  AST 76*  ALT 45*  ALKPHOS 191*  BILITOT 3.4*  PROT 8.1  ALBUMIN 3.6   No results for input(s): LIPASE, AMYLASE in the last 168 hours. No results for input(s): AMMONIA in the last 168 hours. Coagulation Profile: No results for input(s): INR, PROTIME in the last 168 hours. Cardiac Enzymes: No results for input(s): CKTOTAL, CKMB, CKMBINDEX, TROPONINI in the last 168 hours. BNP (last 3 results) No results for input(s): PROBNP in the last 8760 hours. HbA1C: No results for input(s): HGBA1C in the last 72 hours. CBG: Recent Labs  Lab 04/07/20 0149 04/07/20 0236 04/07/20 0357 04/07/20 0458 04/07/20 0554  GLUCAP 419* 367* 317* 290* 250*   Lipid Profile: No results for input(s): CHOL, HDL, LDLCALC, TRIG, CHOLHDL, LDLDIRECT in the last 72 hours. Thyroid Function Tests: No results for input(s): TSH, T4TOTAL, FREET4, T3FREE, THYROIDAB in the last 72  hours. Anemia Panel: No results for input(s): VITAMINB12, FOLATE, FERRITIN, TIBC, IRON, RETICCTPCT in the last 72 hours. Sepsis Labs: Recent Labs  Lab 04/06/20 2217 04/07/20 0046 04/07/20 0354  LATICACIDVEN 3.3* 2.7* 3.2*    Recent Results (from the past 240 hour(s))  Respiratory Panel by RT PCR (Flu A&B, Covid) - Nasopharyngeal Swab     Status: None   Collection Time: 04/06/20  9:58 PM   Specimen: Nasopharyngeal Swab  Result Value Ref Range Status   SARS Coronavirus 2 by RT PCR NEGATIVE NEGATIVE Final    Comment: (NOTE) SARS-CoV-2 target nucleic acids are NOT DETECTED.  The SARS-CoV-2 RNA is generally detectable in upper respiratoy specimens during the acute phase of infection. The lowest concentration of SARS-CoV-2 viral copies this assay can detect is 131 copies/mL. A negative result does not preclude SARS-Cov-2 infection and should not be used as the sole basis for treatment or other patient management decisions. A negative result may occur with  improper specimen collection/handling, submission of specimen other than nasopharyngeal swab, presence of viral mutation(s) within the areas targeted by this assay, and inadequate number of viral copies (<131 copies/mL). A negative result must be combined with clinical observations, patient history, and epidemiological information. The expected result is Negative.  Fact Sheet for Patients:  https://www.moore.com/  Fact Sheet for Healthcare Providers:  https://www.young.biz/  This test is no t yet approved or cleared by the [place] FDA and  has been authorized for detection and/or diagnosis of SARS-CoV-2 by FDA under an Emergency Use Authorization (EUA). This EUA will remain  in effect (meaning this test can be used) for the duration of the COVID-19 declaration under Section 564(b)(1) of the Act, 21 U.S.C. section 360bbb-3(b)(1), unless the authorization is terminated or revoked  sooner.     Influenza A by PCR NEGATIVE NEGATIVE Final   Influenza B by PCR NEGATIVE NEGATIVE Final    Comment: (NOTE) The Xpert Xpress SARS-CoV-2/FLU/RSV assay is intended as an aid in  the diagnosis of influenza from Nasopharyngeal swab specimens and  should not be used as a sole basis for treatment. Nasal washings and  aspirates are unacceptable for Xpert Xpress SARS-CoV-2/FLU/RSV  testing.  Fact Sheet for Patients: https://www.moore.com/  Fact Sheet for Healthcare Providers: https://www.young.biz/  This test is not yet approved or cleared by the [place] FDA and  has been authorized for detection and/or diagnosis of SARS-CoV-2 by  FDA under an Emergency Use Authorization (EUA). This EUA will remain  in effect (meaning this test can be  used) for the duration of the  Covid-19 declaration under Section 564(b)(1) of the Act, 21  U.S.C. section 360bbb-3(b)(1), unless the authorization is  terminated or revoked. Performed at [facility] [address] [place], [place] [address]          Radiology Studies: DG Chest 2 View  Result Date: 04/06/2020 CLINICAL DATA:  44 year old male with hyperglycemia. EXAM: CHEST - 2 VIEW COMPARISON:  Chest radiograph dated 03/30/2018 and CT dated 03/30/2018 FINDINGS: No focal consolidation, pleural effusion, or pneumothorax. The cardiac silhouette is within limits. No acute osseous pathology. IMPRESSION: No active cardiopulmonary disease. Electronically Signed   By: Elgie Collard M.D.   On: 04/06/2020 22:50   CT Head Wo Contrast  Result Date: 04/07/2020 CLINICAL DATA:  Head trauma, altered mental status EXAM: CT HEAD WITHOUT CONTRAST TECHNIQUE: Contiguous axial images were obtained from the base of the skull through the vertex without intravenous contrast. COMPARISON:  None. FINDINGS: Brain: Normal anatomic configuration. No abnormal intra or extra-axial mass lesion or fluid collection. No  abnormal mass effect or midline shift. No evidence of acute intracranial hemorrhage or infarct. Ventricular size is normal. Cerebellum unremarkable. Vascular: Unremarkable Skull: Intact Sinuses/Orbits: Paranasal sinuses are clear. Orbits are unremarkable. Other: Mastoid air cells and middle ear cavities are clear. IMPRESSION: No acute intracranial abnormality.  No calvarial fracture. Electronically Signed   By: Helyn Numbers MD   On: 04/07/2020 03:50   [place] EKG SITE RITE  Result Date: 04/06/2020 If Site Rite image not attached, placement could not be confirmed due to current cardiac rhythm.       Scheduled Meds: . Chlorhexidine Gluconate Cloth  6 each Topical Daily  . enoxaparin (LOVENOX) injection  40 mg Subcutaneous Q24H  . gabapentin  600 mg Oral TID  . sodium chloride flush  10-40 mL Intracatheter Q12H   Continuous Infusions: . sodium chloride 125 mL/hr at 04/07/20 0630  . dextrose 5 % and 0.9% NaCl Stopped (04/07/20 0627)  . insulin    . magnesium sulfate bolus IVPB    . potassium chloride 10 mEq (04/07/20 0749)  . [START ON 04/08/2020] vancomycin       LOS: 1 day     Time spent: 33 mins     Charise Killian, MD [facility] Pager [phone]  If 7PM-7AM, please contact night-coverage [email or web address] 04/07/2020, 8:10 AM

## 2020-04-07 NOTE — ED Notes (Signed)
Date and time results received: 04/07/20  (use smartphrase ".now" to insert current time)  Test: Lactic Acid Critical Value: 3.2  Name of Provider Notified: Manuela Schwartz  Orders Received? Or Actions Taken?: provider notified

## 2020-04-07 NOTE — Progress Notes (Signed)
Peripherally Inserted Central Catheter Placement  The IV Nurse has discussed with the patient and/or persons authorized to consent for the patient, the purpose of this procedure and the potential benefits and risks involved with this procedure.  The benefits include less needle sticks, lab draws from the catheter, and the patient may be discharged home with the catheter. Risks include, but not limited to, infection, bleeding, blood clot (thrombus formation), and puncture of an artery; nerve damage and irregular heartbeat and possibility to perform a PICC exchange if needed/ordered by physician.  Alternatives to this procedure were also discussed.  Bard Power PICC patient education guide, fact sheet on infection prevention and patient information card has been provided to patient /or left at bedside.    PICC Placement Documentation  PICC Double Lumen 04/07/20 PICC Right Brachial 40 cm 0 cm (Active)  Indication for Insertion or Continuance of Line Limited venous access - need for IV therapy >5 days (PICC only) 04/07/20 0030  Exposed Catheter (cm) 0 cm 04/07/20 0030  Site Assessment Clean;Dry;Intact 04/07/20 0030  Lumen #1 Status Blood return noted;Flushed;Saline locked 04/07/20 0030  Lumen #2 Status Blood return noted;Flushed;Saline locked 04/07/20 0030  Dressing Type Transparent;Occlusive;Securing device 04/07/20 0030  Dressing Status Clean;Dry;Intact 04/07/20 0030  Antimicrobial disc in place? Yes 04/07/20 0030  Safety Lock Not Applicable 04/07/20 0030  Line Adjustment (NICU/IV Team Only) No 04/07/20 0030  Dressing Intervention New dressing 04/07/20 0030  Dressing Change Due 04/14/20 04/07/20 0030       Christeen Douglas 04/07/2020, 12:44 AM

## 2020-04-07 NOTE — ED Notes (Signed)
Hospitalitis paged " this pt is coming in for DKA and an infection. His first lactic acid was 3.3 and his repeat was 2.7. He is currently on a LR infusion at 128ml/hr and his vancomycin was just started as well."

## 2020-04-07 NOTE — Progress Notes (Signed)
Inpatient Diabetes Program Recommendations  AACE/ADA: New Consensus Statement on Inpatient Glycemic Control (2015)  Target Ranges:  Prepandial:   less than 140 mg/dL      Peak postprandial:   less than 180 mg/dL (1-2 hours)      Critically ill patients:  140 - 180 mg/dL   Lab Results  Component Value Date   GLUCAP 250 (H) 04/07/2020   HGBA1C 9.1 (A) 02/29/2020   HGBA1C 9.1 02/29/2020   HGBA1C 9.1 (A) 02/29/2020   HGBA1C 9.1 (A) 02/29/2020    Review of Glycemic Control  Diabetes history: DM type 2 Outpatient Diabetes medications: Lantus 30 units, Humalog 10 units tid Current orders for Inpatient glycemic control: IV insulin/Endotool  PCP: Raliegh Ip, NP (patient care center), reports stable A1c at 9.1% on 10/1, however pt with abscesses at that office visit  Pt with inconsistent insulin usage, ETOH consumption.  Inpatient Diabetes Program Recommendations:    Glucose 400's on presentation. Will see pt closer to d/c. Pt has [employer] and follow up.  IV insulin for now with increased Betahydroxybutyric acid 7.19.  At time of transition may consider close to home regimen for DM control:  -  Lantus 28 units -  Novolog 0-15 units tid + hs -  Novolog 5 units tid meal coverage if eating >50% of meals.  Thanks,  Christena Deem RN, MSN, BC-ADM Inpatient Diabetes Coordinator Team Pager [phone] (8a-5p)

## 2020-04-07 NOTE — ED Notes (Signed)
As per provider, D5NS has been stopped and NS started at 154mL/hr. Insulin gtt has stopped until potassium is higher than 3. As per provider, next potassium level to be drawn at regular scheduled time at 0749 with the BMP

## 2020-04-07 NOTE — Progress Notes (Signed)
Pharmacy Antibiotic Note  Tony Long is a 44 y.o. male admitted on 04/06/2020 with cellulitis.  Pharmacy has been consulted for Vancomycin dosing. CrCl = 55.9 ml/min   Plan: Vancomycin 1500 mg IV X 1 ordered to be given on 10/9 @ ~ 0200. Vancomycin 1250 mg IV Q24H ordered to start on 10/10 @ 0200.  Height: 5\' 11"  (180.3 cm) Weight: 68 kg (150 lb) IBW/kg (Calculated) : 75.3  Temp (24hrs), Avg:98.2 F (36.8 C), Min:98.2 F (36.8 C), Max:98.2 F (36.8 C)  Recent Labs  Lab 04/06/20 2217  WBC 14.6*  CREATININE 1.64*  LATICACIDVEN 3.3*    Estimated Creatinine Clearance: 55.9 mL/min (A) (by C-G formula based on SCr of 1.64 mg/dL (H)).    No Known Allergies  Antimicrobials this admission:   >>    >>   Dose adjustments this admission:   Microbiology results:  BCx:   UCx:    Sputum:    MRSA PCR:   Thank you for allowing pharmacy to be a part of this patient's care.  Mande Auvil D 04/07/2020 1:42 AM

## 2020-04-07 NOTE — Progress Notes (Signed)
Pharmacy Antibiotic Note  Tony Long is a 44 y.o. male with medical history including un-controlled diabetes, hepatitis C, hypertension, history of endocarditis, osteomyelitis, and IVDU admitted on 04/06/2020 with anterior chest wall abscess. Patient was recently prescribed Augmentin which he was taking as an outpatient. Patient has been draining abscesses by himself at home. Pharmacy has been consulted for Vancomycin dosing.  Patient received LD of vancomycin 1500 mg x 1 in ED   Plan: Based on patient parameters and improved CrCl will adjust regimen to  Vancomycin 1250 mg IV Q12H  Height: 5\' 11"  (180.3 cm) Weight: 68 kg (150 lb) IBW/kg (Calculated) : 75.3  Temp (24hrs), Avg:98.2 F (36.8 C), Min:98.2 F (36.8 C), Max:98.2 F (36.8 C)  Recent Labs  Lab 04/06/20 2217 04/07/20 0046 04/07/20 0354 04/07/20 0742  WBC 14.6*  --   --   --   CREATININE 1.64* 1.33* 1.35* 0.90  LATICACIDVEN 3.3* 2.7* 3.2* 1.8    Estimated Creatinine Clearance: 101.8 mL/min (by C-G formula based on SCr of 0.9 mg/dL).    No Known Allergies  Antimicrobials this admission: Vancomycin 10/9  >>   Microbiology results: 10/9 BCx: pending 10/9 Abscess Cx: pending  Thank you for allowing pharmacy to be a part of this patient's care.  12/9 04/07/2020 8:54 AM

## 2020-04-07 NOTE — ED Notes (Signed)
Date and time results received: 04/07/20 0522 (use smartphrase ".now" to insert current time)  Test: Potassium Critical Value: 2.4  Name of Provider Notified: Manuela Schwartz  Orders Received? Or Actions Taken?: Provider notified

## 2020-04-08 DIAGNOSIS — L8990 Pressure ulcer of unspecified site, unspecified stage: Secondary | ICD-10-CM | POA: Insufficient documentation

## 2020-04-08 LAB — BASIC METABOLIC PANEL
Anion gap: 11 (ref 5–15)
Anion gap: 11 (ref 5–15)
Anion gap: 17 — ABNORMAL HIGH (ref 5–15)
BUN: 10 mg/dL (ref 6–20)
BUN: 11 mg/dL (ref 6–20)
BUN: 11 mg/dL (ref 6–20)
CO2: 19 mmol/L — ABNORMAL LOW (ref 22–32)
CO2: 24 mmol/L (ref 22–32)
CO2: 25 mmol/L (ref 22–32)
Calcium: 7.8 mg/dL — ABNORMAL LOW (ref 8.9–10.3)
Calcium: 7.9 mg/dL — ABNORMAL LOW (ref 8.9–10.3)
Calcium: 8.3 mg/dL — ABNORMAL LOW (ref 8.9–10.3)
Chloride: 97 mmol/L — ABNORMAL LOW (ref 98–111)
Chloride: 98 mmol/L (ref 98–111)
Chloride: 99 mmol/L (ref 98–111)
Creatinine, Ser: 0.7 mg/dL (ref 0.61–1.24)
Creatinine, Ser: 0.73 mg/dL (ref 0.61–1.24)
Creatinine, Ser: 0.77 mg/dL (ref 0.61–1.24)
GFR, Estimated: >60 mL/min (ref >60)
GFR, Estimated: >60 mL/min (ref >60)
GFR, Estimated: >60 mL/min (ref >60)
Glucose, Bld: 164 mg/dL — ABNORMAL HIGH (ref 70–99)
Glucose, Bld: 230 mg/dL — ABNORMAL HIGH (ref 70–99)
Glucose, Bld: 294 mg/dL — ABNORMAL HIGH (ref 70–99)
Potassium: 3.5 mmol/L (ref 3.5–5.1)
Potassium: 3.6 mmol/L (ref 3.5–5.1)
Potassium: 3.9 mmol/L (ref 3.5–5.1)
Sodium: 133 mmol/L — ABNORMAL LOW (ref 135–145)
Sodium: 133 mmol/L — ABNORMAL LOW (ref 135–145)
Sodium: 135 mmol/L (ref 135–145)

## 2020-04-08 LAB — CBC
HCT: 24.3 % — ABNORMAL LOW (ref 39.0–52.0)
Hemoglobin: 8.6 g/dL — ABNORMAL LOW (ref 13.0–17.0)
MCH: 35.4 pg — ABNORMAL HIGH (ref 26.0–34.0)
MCHC: 35.4 g/dL (ref 30.0–36.0)
MCV: 100 fL (ref 80.0–100.0)
Platelets: 69 10*3/uL — ABNORMAL LOW (ref 150–400)
RBC: 2.43 MIL/uL — ABNORMAL LOW (ref 4.22–5.81)
RDW: 11.9 % (ref 11.5–15.5)
WBC: 6.5 10*3/uL (ref 4.0–10.5)
nRBC: 0 % (ref 0.0–0.2)

## 2020-04-08 LAB — GLUCOSE, CAPILLARY
Glucose-Capillary: 166 mg/dL — ABNORMAL HIGH (ref 70–99)
Glucose-Capillary: 149 mg/dL — ABNORMAL HIGH (ref 70–99)
Glucose-Capillary: 188 mg/dL — ABNORMAL HIGH (ref 70–99)
Glucose-Capillary: 304 mg/dL — ABNORMAL HIGH (ref 70–99)
Glucose-Capillary: 249 mg/dL — ABNORMAL HIGH (ref 70–99)
Glucose-Capillary: 259 mg/dL — ABNORMAL HIGH (ref 70–99)
Glucose-Capillary: 224 mg/dL — ABNORMAL HIGH (ref 70–99)
Glucose-Capillary: 240 mg/dL — ABNORMAL HIGH (ref 70–99)
Glucose-Capillary: 172 mg/dL — ABNORMAL HIGH (ref 70–99)
Glucose-Capillary: 133 mg/dL — ABNORMAL HIGH (ref 70–99)
Glucose-Capillary: 143 mg/dL — ABNORMAL HIGH (ref 70–99)
Glucose-Capillary: 67 mg/dL — ABNORMAL LOW (ref 70–99)
Glucose-Capillary: 77 mg/dL (ref 70–99)
Glucose-Capillary: 142 mg/dL — ABNORMAL HIGH (ref 70–99)

## 2020-04-08 LAB — URINALYSIS, ROUTINE W REFLEX MICROSCOPIC
Bilirubin Urine: NEGATIVE
Glucose, UA: NEGATIVE mg/dL
Hgb urine dipstick: NEGATIVE
Ketones, ur: NEGATIVE mg/dL
Leukocytes,Ua: NEGATIVE
Nitrite: NEGATIVE
Protein, ur: NEGATIVE mg/dL
Specific Gravity, Urine: 1.005 (ref 1.005–1.030)
pH: 6 (ref 5.0–8.0)

## 2020-04-08 MED ORDER — INSULIN GLARGINE 100 UNIT/ML ~~LOC~~ SOLN
20.0000 [IU] | Freq: Every day | SUBCUTANEOUS | Status: DC
  Administered 2020-04-08: 20 [IU] via SUBCUTANEOUS
  Filled 2020-04-08 (×2): qty 0.2

## 2020-04-08 MED ORDER — INSULIN ASPART 100 UNIT/ML ~~LOC~~ SOLN
0.0000 [IU] | Freq: Three times a day (TID) | SUBCUTANEOUS | Status: DC
  Administered 2020-04-08: 1 [IU] via SUBCUTANEOUS
  Administered 2020-04-08 – 2020-04-09 (×2): 3 [IU] via SUBCUTANEOUS
  Filled 2020-04-08 (×3): qty 1

## 2020-04-08 MED ORDER — INSULIN ASPART 100 UNIT/ML ~~LOC~~ SOLN
4.0000 [IU] | Freq: Three times a day (TID) | SUBCUTANEOUS | Status: DC
  Administered 2020-04-08 – 2020-04-09 (×3): 4 [IU] via SUBCUTANEOUS
  Filled 2020-04-08 (×3): qty 1

## 2020-04-08 NOTE — Progress Notes (Signed)
PROGRESS NOTE    Tony Long  POE:[id number] DOB: January 26, 1976 DOA: 04/06/2020 PCP: [name], FNP   Assessment & Plan:   Principal Problem:   DKA (diabetic ketoacidosis) (HCC) Active Problems:   Hypertension   Abscess   Pressure injury of skin  DKA: d/c insulin drip. Start lantus, aspart w/ meals & SSI w/ accuchecks. DM coordinator consulted. Carb modified diet   DM2: poorly controlled. Continue on lantus, aspart, & SSI w/ accuchecks   Anterior chest wall abscess: draining already. Wound & blood cxs NGTD. Continue on IV vanco  Hx of HCV: w/ hx IV drug abuse & endocarditis. Urine drug screen neg  Transaminitis: likely secondary to hx of HCV. Will continue to monitor   Hypokalemia: WNL today. Will continue to monitor   Hypomagnesemia: repleted yesterday. Will continue to monitor    Thrombocytopenia: etiology unclear. Will continue to monitor   HTN: not on any anti-HTN meds and no HTN while inpatient so far    DVT prophylaxis: lovenox  Code Status: full  Family Communication:  Disposition Plan: depends on PT/OT recs   Status is: Inpatient  Remains inpatient appropriate because:Persistent severe electrolyte disturbances   Dispo: The patient is from: Home              Anticipated d/c is to: Home vs SNF               Anticipated d/c date is: 3 days              Patient currently is not medically stable to d/c.    Consultants:     Procedures:   Antimicrobials: vanco    Subjective: Pt c/o weakness  Objective: Vitals:   04/08/20 0900 04/08/20 1000 04/08/20 1100 04/08/20 1200  BP: 109/71 110/67 116/81 95/74  Pulse: 98 94 83 91  Resp: 16 17 15 12   Temp:      TempSrc:      SpO2: 100% 100% 99% 100%  Weight:      Height:        Intake/Output Summary (Last 24 hours) at 04/08/2020 1408 Last data filed at 04/08/2020 1300 Gross per 24 hour  Intake 3814.97 ml  Output 1600 ml  Net 2214.97 ml   Filed Weights   04/06/20 2156 04/07/20 1631    Weight: 68 kg 66 kg    Examination:  General exam: Appears calm & comfortable  Respiratory system: clear breath sounds b/l. No rales, rhonchi  Cardiovascular system: S1/S2+. No rubs or gallops Gastrointestinal system: Abdomen is nondistended, soft and nontender.  Normal bowel sounds heard. Central nervous system: Alert and oriented. Moves all 4 extremities  Psychiatry: Judgement and insight appear normal. Normal mood and affect    Data Reviewed: I have personally reviewed following labs and imaging studies  CBC: Recent Labs  Lab 04/06/20 2217 04/08/20 0843  WBC 14.6* 6.5  NEUTROABS 13.5*  --   HGB 12.1* 8.6*  HCT 34.3* 24.3*  MCV 100.0 100.0  PLT 101* 69*   Basic Metabolic Panel: Recent Labs  Lab 04/06/20 2217 04/07/20 0046 04/07/20 0631 04/07/20 0742 04/07/20 1549 04/07/20 1752 04/08/20 0024 04/08/20 0611 04/08/20 0947  NA 126*   < >  --    < > 132* 134* 135 133* 133*  K 3.1*   < >  --    < > 2.6* 3.0* 3.5 3.9 3.6  CL 74*   < >  --    < > 98 95* 99 97* 98  CO2  15*   < >  --    < > 18* 23 25 19* 24  GLUCOSE 441*   < >  --    < > 589* 161* 164* 294* 230*  BUN 16   < >  --    < > 10 12 10 11 11   CREATININE 1.64*   < >  --    < > 1.05 0.93 0.70 0.77 0.73  CALCIUM 8.5*   < >  --    < > 7.0* 8.0* 7.9* 7.8* 8.3*  MG 2.2  --  1.4*  --   --   --   --   --   --   PHOS  --   --  <1.0*  --   --   --   --   --   --    < > = values in this interval not displayed.   GFR: Estimated Creatinine Clearance: 111.1 mL/min (by C-G formula based on SCr of 0.73 mg/dL). Liver Function Tests: Recent Labs  Lab 04/06/20 2217  AST 76*  ALT 45*  ALKPHOS 191*  BILITOT 3.4*  PROT 8.1  ALBUMIN 3.6   No results for input(s): LIPASE, AMYLASE in the last 168 hours. No results for input(s): AMMONIA in the last 168 hours. Coagulation Profile: No results for input(s): INR, PROTIME in the last 168 hours. Cardiac Enzymes: No results for input(s): CKTOTAL, CKMB, CKMBINDEX, TROPONINI in  the last 168 hours. BNP (last 3 results) No results for input(s): PROBNP in the last 8760 hours. HbA1C: No results for input(s): HGBA1C in the last 72 hours. CBG: Recent Labs  Lab 04/08/20 0836 04/08/20 0935 04/08/20 1043 04/08/20 1139 04/08/20 1248  GLUCAP 259* 224* 240* 172* 133*   Lipid Profile: No results for input(s): CHOL, HDL, LDLCALC, TRIG, CHOLHDL, LDLDIRECT in the last 72 hours. Thyroid Function Tests: No results for input(s): TSH, T4TOTAL, FREET4, T3FREE, THYROIDAB in the last 72 hours. Anemia Panel: No results for input(s): VITAMINB12, FOLATE, FERRITIN, TIBC, IRON, RETICCTPCT in the last 72 hours. Sepsis Labs: Recent Labs  Lab 04/06/20 2217 04/07/20 0046 04/07/20 0354 04/07/20 0742  LATICACIDVEN 3.3* 2.7* 3.2* 1.8    Recent Results (from the past 240 hour(s))  Respiratory Panel by RT PCR (Flu A&B, Covid) - Nasopharyngeal Swab     Status: None   Collection Time: 04/06/20  9:58 PM   Specimen: Nasopharyngeal Swab  Result Value Ref Range Status   SARS Coronavirus 2 by RT PCR NEGATIVE NEGATIVE Final    Comment: (NOTE) SARS-CoV-2 target nucleic acids are NOT DETECTED.  The SARS-CoV-2 RNA is generally detectable in upper respiratoy specimens during the acute phase of infection. The lowest concentration of SARS-CoV-2 viral copies this assay can detect is 131 copies/mL. A negative result does not preclude SARS-Cov-2 infection and should not be used as the sole basis for treatment or other patient management decisions. A negative result may occur with  improper specimen collection/handling, submission of specimen other than nasopharyngeal swab, presence of viral mutation(s) within the areas targeted by this assay, and inadequate number of viral copies (<131 copies/mL). A negative result must be combined with clinical observations, patient history, and epidemiological information. The expected result is Negative.  Fact Sheet for Patients:    06/06/20  Fact Sheet for Healthcare Providers:  https://www.moore.com/  This test is no t yet approved or cleared by the https://www.young.biz/ FDA and  has been authorized for detection and/or diagnosis of SARS-CoV-2 by FDA under an Emergency  Use Authorization (EUA). This EUA will remain  in effect (meaning this test can be used) for the duration of the COVID-19 declaration under Section 564(b)(1) of the Act, 21 U.S.C. section 360bbb-3(b)(1), unless the authorization is terminated or revoked sooner.     Influenza A by PCR NEGATIVE NEGATIVE Final   Influenza B by PCR NEGATIVE NEGATIVE Final    Comment: (NOTE) The Xpert Xpress SARS-CoV-2/FLU/RSV assay is intended as an aid in  the diagnosis of influenza from Nasopharyngeal swab specimens and  should not be used as a sole basis for treatment. Nasal washings and  aspirates are unacceptable for Xpert Xpress SARS-CoV-2/FLU/RSV  testing.  Fact Sheet for Patients: https://www.moore.com/  Fact Sheet for Healthcare Providers: https://www.young.biz/  This test is not yet approved or cleared by the [place] FDA and  has been authorized for detection and/or diagnosis of SARS-CoV-2 by  FDA under an Emergency Use Authorization (EUA). This EUA will remain  in effect (meaning this test can be used) for the duration of the  Covid-19 declaration under Section 564(b)(1) of the Act, 21  U.S.C. section 360bbb-3(b)(1), unless the authorization is  terminated or revoked. Performed at [facility], [address] [place], [place] [address]   CULTURE, BLOOD (ROUTINE X 2) w Reflex to ID Panel     Status: None (Preliminary result)   Collection Time: 04/07/20  2:01 AM   Specimen: BLOOD  Result Value Ref Range Status   Specimen Description BLOOD RIGTH [facility]  Final   Special Requests   Final    BOTTLES DRAWN AEROBIC AND ANAEROBIC Blood Culture adequate  volume   Culture   Final    NO GROWTH 1 DAY Performed at [facility], [address] [place], [place] [address]    Report Status PENDING  Incomplete  CULTURE, BLOOD (ROUTINE X 2) w Reflex to ID Panel     Status: None (Preliminary result)   Collection Time: 04/07/20  2:01 AM   Specimen: BLOOD  Result Value Ref Range Status   Specimen Description BLOOD RIGHT ARM  Final   Special Requests   Final    BOTTLES DRAWN AEROBIC AND ANAEROBIC Blood Culture adequate volume   Culture   Final    NO GROWTH 1 DAY Performed at [facility], [address] [place], [place] [address]    Report Status PENDING  Incomplete  Aerobic Culture (superficial specimen)     Status: None (Preliminary result)   Collection Time: 04/07/20  3:54 AM   Specimen: Abscess  Result Value Ref Range Status   Specimen Description   Final    ABSCESS Performed at [facility], [address], [place], [place] [address]    Special Requests   Final    NONE Performed at [facility], [address] [place], [place] [address]    Gram Stain   Final    FEW WBC PRESENT,BOTH PMN AND MONONUCLEAR NO ORGANISMS SEEN    Culture   Final    NO GROWTH < 24 HOURS Performed at [facility], [address], [place], [place] [address]    Report Status PENDING  Incomplete  MRSA PCR Screening     Status: Abnormal   Collection Time: 04/07/20  4:34 PM   Specimen: Nasopharyngeal  Result Value Ref Range Status   MRSA by PCR POSITIVE (A) NEGATIVE Final    Comment:        The GeneXpert MRSA Assay (FDA approved for NASAL specimens only),  is one component of a comprehensive MRSA colonization surveillance program. It is not intended to diagnose MRSA infection nor to guide or monitor treatment for MRSA infections. RESULT CALLED TO, READ BACK BY AND VERIFIED WITH: MYRA FLOWERS AT 1802 04/07/20.PMF Performed at [facility], [address] [place], [place] [address]           Radiology Studies: DG Chest 2 View  Result Date: 04/06/2020 CLINICAL DATA:  44 year old male with hyperglycemia. EXAM: CHEST - 2 VIEW COMPARISON:  Chest radiograph dated 03/30/2018 and CT dated 03/30/2018 FINDINGS: No focal consolidation, pleural effusion, or pneumothorax. The cardiac silhouette is within limits. No acute osseous pathology. IMPRESSION: No active cardiopulmonary disease. Electronically Signed   By: Elgie CollardArash  Radparvar M.D.   On: 04/06/2020 22:50   CT Head Wo Contrast  Result Date: 04/07/2020 CLINICAL DATA:  Head trauma, altered mental status EXAM: CT HEAD WITHOUT CONTRAST TECHNIQUE: Contiguous axial images were obtained from the base of the skull through the vertex without intravenous contrast. COMPARISON:  None. FINDINGS: Brain: Normal anatomic configuration. No abnormal intra or extra-axial mass lesion or fluid collection. No abnormal mass effect or midline shift. No evidence of acute intracranial hemorrhage or infarct. Ventricular size is normal. Cerebellum unremarkable. Vascular: Unremarkable Skull: Intact Sinuses/Orbits: Paranasal sinuses are clear. Orbits are unremarkable. Other: Mastoid air cells and middle ear cavities are clear. IMPRESSION: No acute intracranial abnormality.  No calvarial fracture. Electronically Signed   By: Helyn NumbersAshesh  Parikh MD   On: 04/07/2020 03:50   US EKG SITE RITE  Result Date: 04/06/2020 If Site Rite image not attached, placement could not be confirmed due to current cardiac rhythm.       Scheduled Meds: . Chlorhexidine Gluconate Cloth  6 each Topical Daily  . Chlorhexidine Gluconate Cloth  6 each Topical Q0600  . enoxaparin (LOVENOX) injection  40 mg Subcutaneous Q24H  . gabapentin  600 mg Oral TID  . insulin aspart  0-9 Units Subcutaneous TID WC  . insulin aspart  4 Units Subcutaneous TID WC  . insulin glargine  20 Units Subcutaneous Daily  . mupirocin ointment  1 application Nasal BID  . sodium chloride flush  10-40 mL  Intracatheter Q12H   Continuous Infusions: . vancomycin 1,250 mg (04/08/20 0305)     LOS: 2 days     Time spent: 31 mins     Charise KillianJamiese M Javae Braaten, MD [facility] Pager [phone]  If 7PM-7AM, please contact night-coverage [email or web address] 04/08/2020, 2:08 PM

## 2020-04-08 NOTE — Progress Notes (Addendum)
Hypoglycemic Event  CBG: 67  Treatment: 8 oz juice/soda, Graham crackers and peanut butter  Symptoms: Sweaty  Follow-up CBG: Time: 2030  CBG Result: 77  Possible Reasons for Event: Unknown  Comments/MD notified: Jon Billings NP    Titus Mould

## 2020-04-08 NOTE — Progress Notes (Signed)
Patient continue on Endo tool for DKA. Patient GAP was 11 at 0000 BMP. Practitioner advised to continue the endo tool until 0600 lab draw to see if the [facility] remains under 12. [name] very frustrated about not being able to eat a meal. Per patient, if he cant eat breakfast he will sign out AMA. Patient had minimal sleep through the night anticipating when he can eat something. Endorsed to oncoming nurse.

## 2020-04-08 NOTE — Evaluation (Addendum)
Occupational Therapy Evaluation Patient Details Name: Tony Long MRN: [id number] DOB: 28-Dec-1975 Today's Date: 04/08/2020    History of Present Illness [name] a 44 y.o. male with medical history significant for uncontrolled type 2 diabetes, history of chronic hepatitis C, hypertension, history of endocarditis, osteomyelitis and IV drug use who presents with fatigue, lethargy, vomiting, dizziness, unsteadiness with fall hitting face, anterior wall chest abscesses.  DKA  with uncontrolled insulin-dependent type 2 diabetes   Clinical Impression   Tony Long was seen for OT evaluation this date. Prior to hospital admission, pt was Independent in mobility and ADLs. Pt lives c girlfriend and 9yo grandchildren in apartment on 2nd floor (full flight of stairs). Pt demonstrates baseline independence to perform ADL and no UE strength, coordination, cognitive, or visual deficits appreciated with assessment. MIN A x2 + RW for ADL t/f and ~40 ft mobility - assist for lines/RW mgmt and lift off. No skilled OT needs identified. Will sign off. Please re-consult if additional OT needs arise.    Follow Up Recommendations  No OT follow up    Equipment Recommendations  None recommended by OT    Recommendations for Other Services       Precautions / Restrictions Precautions Precautions: Fall Restrictions Weight Bearing Restrictions: No      Mobility Bed Mobility      General bed mobility comments: Pt received and left up in chair  Transfers Overall transfer level: Needs assistance Equipment used: Rolling walker (2 wheeled) Transfers: Sit to/from Stand Sit to Stand: Min assist;+2 physical assistance (second person for ICU equipment management )         General transfer comment: verbal cues for technique, hand placement, eccentric control.     Balance Overall balance assessment: History of Falls;Needs assistance Sitting-balance support: Feet supported Sitting balance-Leahy Scale:  Good     Standing balance support: Bilateral upper extremity supported;During functional activity Standing balance-Leahy Scale: Fair Standing balance comment: patient relying heavily on rolling walker for UE support in standing           ADL either performed or assessed with clinical judgement   ADL Overall ADL's : Modified independent       General ADL Comments: don/doff B socks seated EOC - increased time.                   Pertinent Vitals/Pain Pain Assessment: No/denies pain     Hand Dominance     Extremity/Trunk Assessment Upper Extremity Assessment Upper Extremity Assessment: Overall WFL for tasks assessed (BUE 5/5 grossly; R tricep 3+/5. 5 digit opposition intact; Endorses "cold" fingertips." BUE AROM WFL)   Lower Extremity Assessment Lower Extremity Assessment: Defer to PT evaluation RLE Deficits / Details: grossly 4/5 throughout, mild edema noted throughout. endurance impaired for sustianed activity in standing position  RLE Sensation: decreased light touch (distally>proximally ) LLE Deficits / Details: grossly 4/5 throughout, mild edema noted throughout. endurance impaired for sustianed activity in standing position  LLE Sensation: decreased light touch (distally>proximally )       Communication Communication Communication: No difficulties   Cognition Arousal/Alertness: Awake/alert Behavior During Therapy: WFL for tasks assessed/performed Overall Cognitive Status: Within Functional Limits for tasks assessed      General Comments       Exercises Exercises: Other exercises Other Exercises Other Exercises: Pt and girlfriend educated re: OT role, DME recs, d/c recs, falls prevention, ECS, home/routines modifications Other Exercises: LBD, UBD, sit<>stand, sitting/standing balance/tolerance, ~40 ft mobility   Shoulder Instructions  Home Living Family/patient expects to be discharged to:: Private residence Living Arrangements: Spouse/significant  other Available Help at Discharge: Family;Available 24 hours/day (girlfriend, 63 yo grandchildren; parents available PRN ) Type of Home: Apartment Home Access: Stairs to enter Secretary/administrator of Steps: 1 flight  Entrance Stairs-Rails: Can reach both Home Layout: One level     Bathroom Shower/Tub: Tub/shower unit         Home Equipment: Cane - quad;Cane - single point          Prior Functioning/Environment Level of Independence: Independent with assistive device(s)        Comments: intermittent use of cane. Reports 2 falls         OT Problem List: Decreased activity tolerance;Impaired balance (sitting and/or standing)      OT Treatment/Interventions:      OT Goals(Current goals can be found in the care plan section) Acute Rehab OT Goals Patient Stated Goal: to be able to walk and go home  OT Goal Formulation: With patient/family Time For Goal Achievement: 04/22/20 Potential to Achieve Goals: Good  OT Frequency:             Co-evaluation PT/OT/SLP Co-Evaluation/Treatment: Yes Reason for Co-Treatment: To address functional/ADL transfers PT goals addressed during session: Mobility/safety with mobility OT goals addressed during session: ADL's and self-care      AM-PAC OT "6 Clicks" Daily Activity     Outcome Measure Help from another person eating meals?: None Help from another person taking care of personal grooming?: None Help from another person toileting, which includes using toliet, bedpan, or urinal?: A Little Help from another person bathing (including washing, rinsing, drying)?: A Little Help from another person to put on and taking off regular upper body clothing?: None Help from another person to put on and taking off regular lower body clothing?: A Little 6 Click Score: 21   End of Session Equipment Utilized During Treatment: Rolling walker;Gait belt  Activity Tolerance: Patient tolerated treatment well Patient left: with call bell/phone  within reach;in chair;with family/visitor present  OT Visit Diagnosis: Other abnormalities of gait and mobility (R26.89);Muscle weakness (generalized) (M62.81)                Time: 9163-8466 OT Time Calculation (min): 22 min Charges:  OT General Charges $OT Visit: 1 Visit OT Evaluation $OT Eval Moderate Complexity: 1 Mod  Kathie Dike, M.S. OTR/L  04/08/20, 4:07 PM  ascom [phone]

## 2020-04-08 NOTE — Progress Notes (Signed)
Pharmacy Antibiotic Note  Tony Long is a 44 y.o. male with medical history including un-controlled diabetes, hepatitis C, hypertension, history of endocarditis, osteomyelitis, and IVDU admitted on 04/06/2020 with anterior chest wall abscess. Patient was recently prescribed Augmentin which he was taking as an outpatient. Patient has been draining abscesses by himself at home. Pharmacy has been consulted for Vancomycin dosing.    Plan: Continue vancomycin 1250 mg IV Q12H  Height: 5\' 11"  (180.3 cm) Weight: 66 kg (145 lb 8.1 oz) IBW/kg (Calculated) : 75.3  Temp (24hrs), Avg:98.2 F (36.8 C), Min:98.1 F (36.7 C), Max:98.2 F (36.8 C)  Recent Labs  Lab 04/06/20 2217 04/06/20 2217 04/07/20 0046 04/07/20 0046 04/07/20 0354 04/07/20 0354 04/07/20 0742 04/07/20 0742 04/07/20 1200 04/07/20 1549 04/07/20 1752 04/08/20 0024 04/08/20 0611  WBC 14.6*  --   --   --   --   --   --   --   --   --   --   --   --   CREATININE 1.64*   < > 1.33*   < > 1.35*   < > 0.90   < > 0.89 1.05 0.93 0.70 0.77  LATICACIDVEN 3.3*  --  2.7*  --  3.2*  --  1.8  --   --   --   --   --   --    < > = values in this interval not displayed.    Estimated Creatinine Clearance: 111.1 mL/min (by C-G formula based on SCr of 0.77 mg/dL).    No Known Allergies  Antimicrobials this admission: Vancomycin 10/9  >>   Microbiology results: 10/9 BCx: NGTD 10/9 Abscess Cx: pending  Thank you for allowing pharmacy to be a part of this patient's care.  12/9, PharmD 04/08/2020 9:07 AM

## 2020-04-08 NOTE — Evaluation (Signed)
Physical Therapy Evaluation Patient Details Name: Tony Long MRN: [id number] DOB: 21-Apr-1976 Today's Date: 04/08/2020   History of Present Illness  [name] a 44 y.o. male with medical history significant for uncontrolled type 2 diabetes, history of chronic hepatitis C, hypertension, history of endocarditis, osteomyelitis and IV drug use who presents with fatigue, lethargy, vomiting, dizziness, unsteadiness with fall hitting face, anterior wall chest abscesses.  DKA  with uncontrolled insulin-dependent type 2 diabetes  Clinical Impression  PT evaluation completed. Patient pleasant and cooperative during session with significant other at the bedside. Patient needs Min A for standing with cues for safety. Min A also for ambulation for steadying with cues for safety with using rolling walker. Patient has generalized LE weakness with impaired standing endurance for sustained activity. Recommend to continue PT to maximize independence and address functional limitations listed below. HHPT recommended at this time.     Follow Up Recommendations Home health PT    Equipment Recommendations  Rolling walker with 5" wheels    Recommendations for Other Services       Precautions / Restrictions Precautions Precautions: Fall Restrictions Weight Bearing Restrictions: No      Mobility  Bed Mobility               General bed mobility comments: not addressed this session   Transfers Overall transfer level: Needs assistance Equipment used: Rolling walker (2 wheeled) Transfers: Sit to/from Stand Sit to Stand: +2 safety/equipment;Min assist (second person for ICU equipment management )         General transfer comment: verbal cues for technique, hand placement, eccentric control.   Ambulation/Gait Ambulation/Gait assistance: Min assist Gait Distance (Feet): 75 Feet Assistive device: Rolling walker (2 wheeled) Gait Pattern/deviations: Decreased stride length (decreased heel  strike bilaterally ) Gait velocity: decreased    General Gait Details: patient needs cues for use of rolling walker and Min A for safety as patient is mildly unsteady and fatigues quickly with activity   Stairs            Wheelchair Mobility    Modified Rankin (Stroke Patients Only)       Balance Overall balance assessment: History of Falls;Needs assistance Sitting-balance support: Feet supported Sitting balance-Leahy Scale: Good     Standing balance support: Bilateral upper extremity supported;During functional activity Standing balance-Leahy Scale: Fair Standing balance comment: patient relying heavily on rolling walker for UE support in standing                              Pertinent Vitals/Pain Pain Assessment: No/denies pain    Home Living Family/patient expects to be discharged to:: Private residence Living Arrangements: Spouse/significant other Available Help at Discharge: Family Type of Home: Apartment Home Access: Stairs to enter Entrance Stairs-Rails: Can reach both Entrance Stairs-Number of Steps: 1 flight  Home Layout: One level Home Equipment: Cane - quad;Cane - single point      Prior Function Level of Independence: Independent with assistive device(s)         Comments: intermittent use of cane      Hand Dominance        Extremity/Trunk Assessment   Upper Extremity Assessment Upper Extremity Assessment: Defer to OT evaluation    Lower Extremity Assessment Lower Extremity Assessment: RLE deficits/detail;LLE deficits/detail RLE Deficits / Details: grossly 4/5 throughout, mild edema noted throughout. endurance impaired for sustianed activity in standing position  RLE Sensation: decreased light touch (distally>proximally ) LLE Deficits /  Details: grossly 4/5 throughout, mild edema noted throughout. endurance impaired for sustianed activity in standing position  LLE Sensation: decreased light touch (distally>proximally )        Communication   Communication: No difficulties  Cognition Arousal/Alertness: Awake/alert Behavior During Therapy: WFL for tasks assessed/performed Overall Cognitive Status: Within Functional Limits for tasks assessed                                        General Comments      Exercises     Assessment/Plan    PT Assessment Patient needs continued PT services  PT Problem List Decreased strength;Decreased activity tolerance;Decreased balance;Decreased mobility;Decreased knowledge of use of DME;Decreased safety awareness;Decreased knowledge of precautions       PT Treatment Interventions DME instruction;Stair training;Gait training;Functional mobility training;Therapeutic activities;Therapeutic exercise;Balance training;Neuromuscular re-education;Patient/family education    PT Goals (Current goals can be found in the Care Plan section)  Acute Rehab PT Goals Patient Stated Goal: to be able to walk and go home  PT Goal Formulation: With patient/family Time For Goal Achievement: 04/22/20 Potential to Achieve Goals: Good    Frequency Min 2X/week   Barriers to discharge        Co-evaluation PT/OT/SLP Co-Evaluation/Treatment: Yes Reason for Co-Treatment: To address functional/ADL transfers PT goals addressed during session: Mobility/safety with mobility         AM-PAC PT "6 Clicks" Mobility  Outcome Measure Help needed turning from your back to your side while in a flat bed without using bedrails?: A Little Help needed moving from lying on your back to sitting on the side of a flat bed without using bedrails?: A Little Help needed moving to and from a bed to a chair (including a wheelchair)?: A Little Help needed standing up from a chair using your arms (e.g., wheelchair or bedside chair)?: A Little Help needed to walk in hospital room?: A Little Help needed climbing 3-5 steps with a railing? : A Little 6 Click Score: 18    End of Session Equipment  Utilized During Treatment: Gait belt Activity Tolerance: Patient tolerated treatment well Patient left: in chair;with call bell/phone within reach;with family/visitor present   PT Visit Diagnosis: Muscle weakness (generalized) (M62.81);Unsteadiness on feet (R26.81);Difficulty in walking, not elsewhere classified (R26.2)    Time: 1410-1433 PT Time Calculation (min) (ACUTE ONLY): 23 min   Charges:   PT Evaluation $PT Eval Moderate Complexity: 1 Mod PT Treatments $Gait Training: 8-22 mins        Donna Bernard, PT, MPT   [employer] [employer] 04/08/2020, 3:27 PM

## 2020-04-08 NOTE — Progress Notes (Signed)
Patient transitioned off of endo tool. Orders for physical therapy consult. Carb modified diet order. Patient being tx to floor.

## 2020-04-09 DIAGNOSIS — L0291 Cutaneous abscess, unspecified: Secondary | ICD-10-CM

## 2020-04-09 DIAGNOSIS — I10 Essential (primary) hypertension: Secondary | ICD-10-CM

## 2020-04-09 LAB — COMPREHENSIVE METABOLIC PANEL
ALT: 45 U/L — ABNORMAL HIGH (ref 0–44)
AST: 155 U/L — ABNORMAL HIGH (ref 15–41)
Albumin: 2.5 g/dL — ABNORMAL LOW (ref 3.5–5.0)
Alkaline Phosphatase: 335 U/L — ABNORMAL HIGH (ref 38–126)
Anion gap: 10 (ref 5–15)
BUN: 14 mg/dL (ref 6–20)
CO2: 25 mmol/L (ref 22–32)
Calcium: 8.3 mg/dL — ABNORMAL LOW (ref 8.9–10.3)
Chloride: 100 mmol/L (ref 98–111)
Creatinine, Ser: 0.58 mg/dL — ABNORMAL LOW (ref 0.61–1.24)
GFR, Estimated: >60 mL/min (ref >60)
Glucose, Bld: 223 mg/dL — ABNORMAL HIGH (ref 70–99)
Potassium: 3.4 mmol/L — ABNORMAL LOW (ref 3.5–5.1)
Sodium: 135 mmol/L (ref 135–145)
Total Bilirubin: 1.4 mg/dL — ABNORMAL HIGH (ref 0.3–1.2)
Total Protein: 5.8 g/dL — ABNORMAL LOW (ref 6.5–8.1)

## 2020-04-09 LAB — CBC
HCT: 25.6 % — ABNORMAL LOW (ref 39.0–52.0)
Hemoglobin: 9 g/dL — ABNORMAL LOW (ref 13.0–17.0)
MCH: 35.3 pg — ABNORMAL HIGH (ref 26.0–34.0)
MCHC: 35.2 g/dL (ref 30.0–36.0)
MCV: 100.4 fL — ABNORMAL HIGH (ref 80.0–100.0)
Platelets: 102 10*3/uL — ABNORMAL LOW (ref 150–400)
RBC: 2.55 MIL/uL — ABNORMAL LOW (ref 4.22–5.81)
RDW: 11.9 % (ref 11.5–15.5)
WBC: 5.2 10*3/uL (ref 4.0–10.5)
nRBC: 0 % (ref 0.0–0.2)

## 2020-04-09 LAB — AEROBIC CULTURE W GRAM STAIN (SUPERFICIAL SPECIMEN): Culture: NO GROWTH

## 2020-04-09 LAB — GLUCOSE, CAPILLARY
Glucose-Capillary: 170 mg/dL — ABNORMAL HIGH (ref 70–99)
Glucose-Capillary: 238 mg/dL — ABNORMAL HIGH (ref 70–99)
Glucose-Capillary: 241 mg/dL — ABNORMAL HIGH (ref 70–99)

## 2020-04-09 MED ORDER — INFLUENZA VAC SPLIT QUAD 0.5 ML IM SUSY
0.5000 mL | PREFILLED_SYRINGE | INTRAMUSCULAR | Status: DC | PRN
  Filled 2020-04-09: qty 0.5

## 2020-04-09 MED ORDER — ENSURE MAX PROTEIN PO LIQD
11.0000 [oz_av] | Freq: Two times a day (BID) | ORAL | Status: DC
  Filled 2020-04-09: qty 330

## 2020-04-09 MED ORDER — POTASSIUM CHLORIDE CRYS ER 20 MEQ PO TBCR
20.0000 meq | EXTENDED_RELEASE_TABLET | Freq: Once | ORAL | Status: AC
  Administered 2020-04-09: 20 meq via ORAL
  Filled 2020-04-09: qty 1

## 2020-04-09 MED ORDER — DOXYCYCLINE HYCLATE 100 MG PO TBEC: 100.0000 mg | DELAYED_RELEASE_TABLET | Freq: Two times a day (BID) | ORAL | 0 refills | Status: AC

## 2020-04-09 MED ORDER — RISAQUAD PO CAPS
2.0000 | ORAL_CAPSULE | Freq: Three times a day (TID) | ORAL | Status: DC
  Administered 2020-04-09: 2 via ORAL
  Filled 2020-04-09 (×3): qty 2

## 2020-04-09 NOTE — Discharge Summary (Signed)
Physician Discharge Summary  Tony Long ZOX:[id number] DOB: 04/09/1976 DOA: 04/06/2020  PCP: Kallie LocksStroud, Natalie M, FNP  Admit date: 04/06/2020 Discharge date: 04/09/2020  Admitted From: home Disposition: home   Recommendations for Outpatient Follow-up:  1. Follow up with PCP in 1-2 weeks 2. F/u w/ endo in 1-2 weeks  Home Health: yes Equipment/Devices: walker  Discharge Condition: stable  CODE STATUS: full  Diet recommendation: Carb Modified  Brief/Interim Summary: HPI was taken from Dr. Cyndia Bentu: Tony Long is a 44 y.o. male with medical history significant for uncontrolled type 2 diabetes, history of chronic hepatitis C, hypertension, history of endocarditis, osteomyelitis and IV drug use who presents with concerns of feeling unwell for the past week and a half. States he has been feeling more fatigue and lethargic.  Also has been vomiting with decreased p.o. intake.  About 5 days ago he also has been feeling more dizzy and unsteady and fell face first hitting the right side of his face.  He had some blurry vision after that.   He he also recently developed anterior chest wall abscess adjacent to bilateral axillary area and was prescribed 20 days of Augmentin by PCP on 10/1.  These abscesses has been purulent and he has been draining them himself at home. Denies any fever. Patient has uncontrolled type 2 diabetes since has not been consistent with taking his insulin.  He denies any recent IV drug use but does continue to use tobacco.  ED Course: He was afebrile, tachycardic, normotensive on ambient air. CBC shows leukocytosis of 14.6, mild anemia of 12.1.  Lactic acid of 3.3.  Platelet of 101. Sodium 126 but with glucose of 414, anion gap of 37, pH of 7.31, bicarb of 16. Creatinine also shows AKI with creatinine of 1.64 from a prior of 0.56.  Hospital Course from Dr. Wilfred LacyJ. Han Vejar 10/9-10/11/21: Pt presented in DKA secondary to poorly controlled DM2. Pt was started on insulin drip  which has since been weaned off. Pt was then converted to sq insulin. DM coordinator did see the pt and provide education. Of note, pt had 2 anterior chest wall abscess that were previously drained and pt was given IV vanco while inpatient & d/c home w/ 2 days of po doxy. Wound cxs showed NGTD. Of note, PT/OT saw the pt and recommended home health. Home health was set up by CM prior to d/c.   Discharge Diagnoses:  Principal Problem:   DKA (diabetic ketoacidosis) (HCC) Active Problems:   Hypertension   Abscess   Pressure injury of skin  DKA: d/c insulin drip. Start lantus, aspart w/ meals & SSI w/ accuchecks. DM coordinator consulted. Carb modified diet   DM2: poorly controlled. Continue on lantus, aspart, & SSI w/ accuchecks   Anterior chest wall abscess: draining already. Wound & blood cxs NGTD. Continue on IV vanco  Hx of HCV: w/ hx IV drug abuse & endocarditis. Urine drug screen neg  Transaminitis: likely secondary to hx of HCV. Will continue to monitor   Hypokalemia: KCl repleted.. Will continue to monitor   Hypomagnesemia: repleted yesterday. Will continue to monitor    Thrombocytopenia: etiology unclear. Will continue to monitor   HTN: not on any anti-HTN meds and no HTN while inpatient so far   Discharge Instructions  Discharge Instructions    Diet - low sodium heart healthy   Complete by: As directed    Diet Carb Modified   Complete by: As directed    Discharge instructions   Complete by: As directed  F/u PCP in 1 -2 weeks. Will need CMP to check LFTs. F/u w/ endocrinology for poorly controlled DM2, may need a referral from PCP to go to endocrinology   Increase activity slowly   Complete by: As directed    No wound care   Complete by: As directed      Allergies as of 04/09/2020   No Known Allergies     Medication List    STOP taking these medications   amoxicillin-clavulanate 875-125 MG tablet Commonly known as: Augmentin   ibuprofen 600 MG  tablet Commonly known as: ADVIL     TAKE these medications   cyclobenzaprine 5 MG tablet Commonly known as: FLEXERIL Take 1-2 tablets (5-10 mg total) by mouth 3 (three) times daily as needed for muscle spasms.   doxycycline 100 MG EC tablet Commonly known as: DORYX Take 1 tablet (100 mg total) by mouth 2 (two) times daily for 2 days.   gabapentin 300 MG capsule Commonly known as: NEURONTIN Take 2 capsule (600 mg= total) by mouth, 3 times a day.   glucose blood test strip Use as instructed   insulin glargine 100 UNIT/ML Solostar Pen Commonly known as: LANTUS Inject 50 Units into the skin daily at 10 pm. What changed: how much to take   insulin lispro 100 UNIT/ML injection Commonly known as: HumaLOG Inject 0.1 mLs (10 Units total) into the skin 3 (three) times daily with meals. What changed: additional instructions   Jardiance 10 MG Tabs tablet Generic drug: empagliflozin Take 10 mg by mouth daily before breakfast.   lisinopril 5 MG tablet Commonly known as: ZESTRIL Take 1 tablet (5 mg total) by mouth daily.   metoprolol tartrate 50 MG tablet Commonly known as: LOPRESSOR Take 1 tablet (50 mg total) by mouth 2 (two) times daily.   onetouch ultrasoft lancets Use as instructed   pravastatin 40 MG tablet Commonly known as: PRAVACHOL Take 1 tablet (40 mg total) by mouth daily.   sildenafil 25 MG tablet Commonly known as: VIAGRA Take 1 tablet (25 mg total) by mouth daily as needed for erectile dysfunction.   Vitamin D (Ergocalciferol) 1.25 MG (50000 UNIT) Caps capsule Commonly known as: DRISDOL Take 1 capsule (50,000 Units total) by mouth every 7 (seven) days.            Durable Medical Equipment  (From admission, onward)         Start     Ordered   04/09/20 1037  For home use only DME Walker rolling  Once       Question Answer Comment  Walker: With 5 Inch Wheels   Patient needs a walker to treat with the following condition Generalized weakness       04/09/20 1037          No Known Allergies  Consultations:   Procedures/Studies: DG Chest 2 View  Result Date: 04/06/2020 CLINICAL DATA:  44 year old male with hyperglycemia. EXAM: CHEST - 2 VIEW COMPARISON:  Chest radiograph dated 03/30/2018 and CT dated 03/30/2018 FINDINGS: No focal consolidation, pleural effusion, or pneumothorax. The cardiac silhouette is within limits. No acute osseous pathology. IMPRESSION: No active cardiopulmonary disease. Electronically Signed   By: Elgie Collard M.D.   On: 04/06/2020 22:50   CT Head Wo Contrast  Result Date: 04/07/2020 CLINICAL DATA:  Head trauma, altered mental status EXAM: CT HEAD WITHOUT CONTRAST TECHNIQUE: Contiguous axial images were obtained from the base of the skull through the vertex without intravenous contrast. COMPARISON:  None. FINDINGS: Brain:  Normal anatomic configuration. No abnormal intra or extra-axial mass lesion or fluid collection. No abnormal mass effect or midline shift. No evidence of acute intracranial hemorrhage or infarct. Ventricular size is normal. Cerebellum unremarkable. Vascular: Unremarkable Skull: Intact Sinuses/Orbits: Paranasal sinuses are clear. Orbits are unremarkable. Other: Mastoid air cells and middle ear cavities are clear. IMPRESSION: No acute intracranial abnormality.  No calvarial fracture. Electronically Signed   By: Helyn Numbers MD   On: 04/07/2020 03:50   [place] EKG SITE RITE  Result Date: 04/06/2020 If Site Rite image not attached, placement could not be confirmed due to current cardiac rhythm.      Subjective: Pt c/o malaise    Discharge Exam: Vitals:   04/09/20 0600 04/09/20 0800  BP: 110/79 97/72  Pulse: 90 (!) 106  Resp: 14 13  Temp:  98.5 F (36.9 C)  SpO2: 96% 98%   Vitals:   04/09/20 0400 04/09/20 0500 04/09/20 0600 04/09/20 0800  BP: 107/74 114/80 110/79 97/72  Pulse: 80 84 90 (!) 106  Resp: Temp:    98.5 F (36.9 C)  TempSrc:    Oral  SpO2: 97% 98% 96%  98%  Weight:      Height:        General: Pt is alert, awake, not in acute distress Cardiovascular: S1/S2 +, no rubs, no gallops Respiratory: CTA bilaterally, no wheezing, no rhonchi Abdominal: Soft, NT, ND, bowel sounds + Extremities: no edema, no cyanosis    The results of significant diagnostics from this hospitalization (including imaging, microbiology, ancillary and laboratory) are listed below for reference.     Microbiology: Recent Results (from the past 240 hour(s))  Respiratory Panel by RT PCR (Flu A&B, Covid) - Nasopharyngeal Swab     Status: None   Collection Time: 04/06/20  9:58 PM   Specimen: Nasopharyngeal Swab  Result Value Ref Range Status   SARS Coronavirus 2 by RT PCR NEGATIVE NEGATIVE Final    Comment: (NOTE) SARS-CoV-2 target nucleic acids are NOT DETECTED.  The SARS-CoV-2 RNA is generally detectable in upper respiratoy specimens during the acute phase of infection. The lowest concentration of SARS-CoV-2 viral copies this assay can detect is 131 copies/mL. A negative result does not preclude SARS-Cov-2 infection and should not be used as the sole basis for treatment or other patient management decisions. A negative result may occur with  improper specimen collection/handling, submission of specimen other than nasopharyngeal swab, presence of viral mutation(s) within the areas targeted by this assay, and inadequate number of viral copies (<131 copies/mL). A negative result must be combined with clinical observations, patient history, and epidemiological information. The expected result is Negative.  Fact Sheet for Patients:  https://www.moore.com/  Fact Sheet for Healthcare Providers:  https://www.young.biz/  This test is no t yet approved or cleared by the [place] FDA and  has been authorized for detection and/or diagnosis of SARS-CoV-2 by FDA under an Emergency Use Authorization (EUA). This EUA will remain   in effect (meaning this test can be used) for the duration of the COVID-19 declaration under Section 564(b)(1) of the Act, 21 U.S.C. section 360bbb-3(b)(1), unless the authorization is terminated or revoked sooner.     Influenza A by PCR NEGATIVE NEGATIVE Final   Influenza B by PCR NEGATIVE NEGATIVE Final    Comment: (NOTE) The Xpert Xpress SARS-CoV-2/FLU/RSV assay is intended as an aid in  the diagnosis of influenza from Nasopharyngeal swab specimens and  should not be used as a sole  basis for treatment. Nasal washings and  aspirates are unacceptable for Xpert Xpress SARS-CoV-2/FLU/RSV  testing.  Fact Sheet for Patients: https://www.moore.com/  Fact Sheet for Healthcare Providers: https://www.young.biz/  This test is not yet approved or cleared by the [place] FDA and  has been authorized for detection and/or diagnosis of SARS-CoV-2 by  FDA under an Emergency Use Authorization (EUA). This EUA will remain  in effect (meaning this test can be used) for the duration of the  Covid-19 declaration under Section 564(b)(1) of the Act, 21  U.S.C. section 360bbb-3(b)(1), unless the authorization is  terminated or revoked. Performed at [name], [address] [place], [place] [address]   CULTURE, BLOOD (ROUTINE X 2) w Reflex to ID Panel     Status: None (Preliminary result)   Collection Time: 04/07/20  2:01 AM   Specimen: BLOOD  Result Value Ref Range Status   Specimen Description BLOOD RIGTH [facility]  Final   Special Requests   Final    BOTTLES DRAWN AEROBIC AND ANAEROBIC Blood Culture adequate volume   Culture   Final    NO GROWTH 2 DAYS Performed at [facility], [address], [place], [place] [address]    Report Status PENDING  Incomplete  CULTURE, BLOOD (ROUTINE X 2) w Reflex to ID Panel     Status: None (Preliminary result)   Collection Time: 04/07/20  2:01 AM   Specimen: BLOOD  Result Value Ref Range Status    Specimen Description BLOOD RIGHT ARM  Final   Special Requests   Final    BOTTLES DRAWN AEROBIC AND ANAEROBIC Blood Culture adequate volume   Culture   Final    NO GROWTH 2 DAYS Performed at [name], [address] [place], [place] [address]    Report Status PENDING  Incomplete  Aerobic Culture (superficial specimen)     Status: None (Preliminary result)   Collection Time: 04/07/20  3:54 AM   Specimen: Abscess  Result Value Ref Range Status   Specimen Description   Final    ABSCESS Performed at [facility], [address] [place], [place] [address]    Special Requests   Final    NONE Performed at [facility], [address] [place], [place] [address]    Gram Stain   Final    FEW WBC PRESENT,BOTH PMN AND MONONUCLEAR NO ORGANISMS SEEN    Culture   Final    NO GROWTH 2 DAYS Performed at [facility], [address], [place], [place] [address]    Report Status PENDING  Incomplete  MRSA PCR Screening     Status: Abnormal   Collection Time: 04/07/20  4:34 PM   Specimen: Nasopharyngeal  Result Value Ref Range Status   MRSA by PCR POSITIVE (A) NEGATIVE Final    Comment:        The GeneXpert MRSA Assay (FDA approved for NASAL specimens only), is one component of a comprehensive MRSA colonization surveillance program. It is not intended to diagnose MRSA infection nor to guide or monitor treatment for MRSA infections. RESULT CALLED TO, READ BACK BY AND VERIFIED WITH: MYRA FLOWERS AT 1802 04/07/20.PMF Performed at [facility], [address] [place], [place] [address]      Labs: BNP (last 3 results) No results for input(s): BNP in the last 8760 hours. Basic Metabolic Panel: Recent Labs  Lab 04/06/20 2217 04/07/20 0046 04/07/20 0631 04/07/20 2423 04/07/20 1752 04/08/20 0024 04/08/20 5361 04/08/20 4431  04/09/20 0452  NA 126*   < >  --    < > 134* 135 133* 133* 135  K 3.1*   < >  --    < > 3.0* 3.5 3.9 3.6  3.4*  CL 74*   < >  --    < > 95* 99 97* 98 100  CO2 15*   < >  --    < > 23 25 19* 24 25  GLUCOSE 441*   < >  --    < > 161* 164* 294* 230* 223*  BUN 16   < >  --    < > 12 10 11 11 14   CREATININE 1.64*   < >  --    < > 0.93 0.70 0.77 0.73 0.58*  CALCIUM 8.5*   < >  --    < > 8.0* 7.9* 7.8* 8.3* 8.3*  MG 2.2  --  1.4*  --   --   --   --   --   --   PHOS  --   --  <1.0*  --   --   --   --   --   --    < > = values in this interval not displayed.   Liver Function Tests: Recent Labs  Lab 04/06/20 2217 04/09/20 0452  AST 76* 155*  ALT 45* 45*  ALKPHOS 191* 335*  BILITOT 3.4* 1.4*  PROT 8.1 5.8*  ALBUMIN 3.6 2.5*   No results for input(s): LIPASE, AMYLASE in the last 168 hours. No results for input(s): AMMONIA in the last 168 hours. CBC: Recent Labs  Lab 04/06/20 2217 04/08/20 0843 04/09/20 0452  WBC 14.6* 6.5 5.2  NEUTROABS 13.5*  --   --   HGB 12.1* 8.6* 9.0*  HCT 34.3* 24.3* 25.6*  MCV 100.0 100.0 100.4*  PLT 101* 69* 102*   Cardiac Enzymes: No results for input(s): CKTOTAL, CKMB, CKMBINDEX, TROPONINI in the last 168 hours. BNP: Invalid input(s): POCBNP CBG: Recent Labs  Lab 04/08/20 2030 04/08/20 2132 04/09/20 0010 04/09/20 0430 04/09/20 0720  GLUCAP 77 142* 170* 241* 238*   D-Dimer No results for input(s): DDIMER in the last 72 hours. Hgb A1c No results for input(s): HGBA1C in the last 72 hours. Lipid Profile No results for input(s): CHOL, HDL, LDLCALC, TRIG, CHOLHDL, LDLDIRECT in the last 72 hours. Thyroid function studies No results for input(s): TSH, T4TOTAL, T3FREE, THYROIDAB in the last 72 hours.  Invalid input(s): FREET3 Anemia work up No results for input(s): VITAMINB12, FOLATE, FERRITIN, TIBC, IRON, RETICCTPCT in the last 72 hours. Urinalysis    Component Value Date/Time   COLORURINE YELLOW (A) 04/08/2020 2050   APPEARANCEUR CLEAR (A) 04/08/2020 2050   LABSPEC 1.005 04/08/2020 2050   PHURINE 6.0 04/08/2020 2050   GLUCOSEU NEGATIVE  04/08/2020 2050   HGBUR NEGATIVE 04/08/2020 2050   BILIRUBINUR NEGATIVE 04/08/2020 2050   BILIRUBINUR neg 02/29/2020 0918   KETONESUR NEGATIVE 04/08/2020 2050   PROTEINUR NEGATIVE 04/08/2020 2050   UROBILINOGEN 1.0 02/29/2020 0918   UROBILINOGEN 1.0 12/11/2016 1020   NITRITE NEGATIVE 04/08/2020 2050   LEUKOCYTESUR NEGATIVE 04/08/2020 2050   Sepsis Labs Invalid input(s): PROCALCITONIN,  WBC,  LACTICIDVEN Microbiology Recent Results (from the past 240 hour(s))  Respiratory Panel by RT PCR (Flu A&B, Covid) - Nasopharyngeal Swab     Status: None   Collection Time: 04/06/20  9:58 PM   Specimen: Nasopharyngeal Swab  Result Value Ref Range Status   SARS Coronavirus  2 by RT PCR NEGATIVE NEGATIVE Final    Comment: (NOTE) SARS-CoV-2 target nucleic acids are NOT DETECTED.  The SARS-CoV-2 RNA is generally detectable in upper respiratoy specimens during the acute phase of infection. The lowest concentration of SARS-CoV-2 viral copies this assay can detect is 131 copies/mL. A negative result does not preclude SARS-Cov-2 infection and should not be used as the sole basis for treatment or other patient management decisions. A negative result may occur with  improper specimen collection/handling, submission of specimen other than nasopharyngeal swab, presence of viral mutation(s) within the areas targeted by this assay, and inadequate number of viral copies (<131 copies/mL). A negative result must be combined with clinical observations, patient history, and epidemiological information. The expected result is Negative.  Fact Sheet for Patients:  https://www.moore.com/  Fact Sheet for Healthcare Providers:  https://www.young.biz/  This test is no t yet approved or cleared by the [place] FDA and  has been authorized for detection and/or diagnosis of SARS-CoV-2 by FDA under an Emergency Use Authorization (EUA). This EUA will remain  in effect  (meaning this test can be used) for the duration of the COVID-19 declaration under Section 564(b)(1) of the Act, 21 U.S.C. section 360bbb-3(b)(1), unless the authorization is terminated or revoked sooner.     Influenza A by PCR NEGATIVE NEGATIVE Final   Influenza B by PCR NEGATIVE NEGATIVE Final    Comment: (NOTE) The Xpert Xpress SARS-CoV-2/FLU/RSV assay is intended as an aid in  the diagnosis of influenza from Nasopharyngeal swab specimens and  should not be used as a sole basis for treatment. Nasal washings and  aspirates are unacceptable for Xpert Xpress SARS-CoV-2/FLU/RSV  testing.  Fact Sheet for Patients: https://www.moore.com/  Fact Sheet for Healthcare Providers: https://www.young.biz/  This test is not yet approved or cleared by the [place] FDA and  has been authorized for detection and/or diagnosis of SARS-CoV-2 by  FDA under an Emergency Use Authorization (EUA). This EUA will remain  in effect (meaning this test can be used) for the duration of the  Covid-19 declaration under Section 564(b)(1) of the Act, 21  U.S.C. section 360bbb-3(b)(1), unless the authorization is  terminated or revoked. Performed at [facility], [address] [place], [place] [address]   CULTURE, BLOOD (ROUTINE X 2) w Reflex to ID Panel     Status: None (Preliminary result)   Collection Time: 04/07/20  2:01 AM   Specimen: BLOOD  Result Value Ref Range Status   Specimen Description BLOOD RIGTH [facility]  Final   Special Requests   Final    BOTTLES DRAWN AEROBIC AND ANAEROBIC Blood Culture adequate volume   Culture   Final    NO GROWTH 2 DAYS Performed at [facility], [address] [place], [place] [address]    Report Status PENDING  Incomplete  CULTURE, BLOOD (ROUTINE X 2) w Reflex to ID Panel     Status: None (Preliminary result)   Collection Time: 04/07/20  2:01 AM   Specimen: BLOOD  Result Value Ref Range Status   Specimen  Description BLOOD RIGHT ARM  Final   Special Requests   Final    BOTTLES DRAWN AEROBIC AND ANAEROBIC Blood Culture adequate volume   Culture   Final    NO GROWTH 2 DAYS Performed at [name], [address], [place], [place] [address]    Report Status PENDING  Incomplete  Aerobic Culture (superficial specimen)     Status: None (Preliminary result)   Collection  Time: 04/07/20  3:54 AM   Specimen: Abscess  Result Value Ref Range Status   Specimen Description   Final    ABSCESS Performed at [facility], [address], [place], [place] [address]    Special Requests   Final    NONE Performed at [facility], [address] [place], [place] [address]    Gram Stain   Final    FEW WBC PRESENT,BOTH PMN AND MONONUCLEAR NO ORGANISMS SEEN    Culture   Final    NO GROWTH 2 DAYS Performed at [name], [address], [place], [place] [address]    Report Status PENDING  Incomplete  MRSA PCR Screening     Status: Abnormal   Collection Time: 04/07/20  4:34 PM   Specimen: Nasopharyngeal  Result Value Ref Range Status   MRSA by PCR POSITIVE (A) NEGATIVE Final    Comment:        The GeneXpert MRSA Assay (FDA approved for NASAL specimens only), is one component of a comprehensive MRSA colonization surveillance program. It is not intended to diagnose MRSA infection nor to guide or monitor treatment for MRSA infections. RESULT CALLED TO, READ BACK BY AND VERIFIED WITH: MYRA FLOWERS AT 1802 04/07/20.PMF Performed at [name], [address] [place], [place] [address]      Time coordinating discharge: Over 30 minutes  SIGNED:   Charise Killian, MD  [facility] 04/09/2020, 2:41 PM Pager   If 7PM-7AM, please contact night-coverage [email or web address]

## 2020-04-09 NOTE — Progress Notes (Signed)
Patient called out asking for right upper arm PICC to be removed.  Patient stated "I have family on the way and if you don't take it out then I will leave with it".  Dr. Mayford Knife notified of patient impatience to leave.  MD asked if it could be explained importance to wait on discharge instructions, home health info, and walker.  Patient stated "I'm leaving with or without discharge instructions. I don't want anyone coming to my house and my parents have a walker I can use". Right upper arm PICC removed per policy and procedure.

## 2020-04-09 NOTE — Progress Notes (Signed)
Initial Nutrition Assessment  DOCUMENTATION CODES:   Severe malnutrition in context of chronic illness  INTERVENTION:   Ensure Max protein supplement BID, each supplement provides 150kcal and 30g of protein.  MVI daily   NUTRITION DIAGNOSIS:   Severe Malnutrition related to chronic illness (uncontrolled DM, substance abuse) as evidenced by severe muscle depletion, severe fat depletion.  GOAL:   Patient will meet greater than or equal to 90% of their needs  MONITOR:   PO intake, Supplement acceptance, Labs, Weight trends, Skin, I & O's  REASON FOR ASSESSMENT:   Malnutrition Screening Tool    ASSESSMENT:   44 y.o. male with medical history significant for uncontrolled type 2 diabetes, history of chronic hepatitis C, hypertension, history of endocarditis, osteomyelitis and IV drug use who presents with DKA   Met with pt in room today. Pt reports good appetite and oral intake pta and in hospital; pt eating 100% of meals. Pt reports that he does not drink any supplements at home but that he knows he needs to. RD recommended high protein/low carbohydrate supplements at home. Per chart, pt appears weight stable pta. Pt reports that his UBW was around 220lbs prior to getting DM but that he has weighed the same weight now for many years. RD will add supplements to help pt meet his estimated needs.   Medications reviewed and include: risaquad, lovenox, vancomycin   Labs reviewed: K 3.4(L), creat 0.58(L), alk phos 335(H), AST 155(H), ALT 45(H) Hgb 9.0(L), Hct 25.6(L) cbgs- 170, 241, 238 x 2 hrs AIC 9.1(H)- 9/1  NUTRITION - FOCUSED PHYSICAL EXAM:    Most Recent Value  Orbital Region Moderate depletion  Upper Arm Region Severe depletion  Thoracic and Lumbar Region Moderate depletion  Buccal Region Moderate depletion  Temple Region Severe depletion  Clavicle Bone Region Severe depletion  Clavicle and Acromion Bone Region Severe depletion  Scapular Bone Region Severe depletion   Dorsal Hand Moderate depletion  Patellar Region Severe depletion  Anterior Thigh Region Severe depletion  Posterior Calf Region Severe depletion  Edema (RD Assessment) None  Hair Reviewed  Eyes Reviewed  Mouth Reviewed  Skin Reviewed  Nails Reviewed     Diet Order:   Diet Order            Diet - low sodium heart healthy           Diet Carb Modified           Diet Carb Modified Fluid consistency: Thin; Room service appropriate? Yes  Diet effective now                EDUCATION NEEDS:   Education needs have been addressed  Skin:  Skin Assessment: Reviewed RN Assessment (Stage II buttocks)  Last BM:  10/11- TYPE 6  Height:   Ht Readings from Last 1 Encounters:  04/07/20 _0  (1.803 m)    Weight:   Wt Readings from Last 1 Encounters:  04/07/20 66 kg    Ideal Body Weight:  78 kg  BMI:  Body mass index is 20.29 kg/m.  Estimated Nutritional Needs:   Kcal:  2100-2400kcal/day  Protein:  105-120g/day  Fluid:  >2L/day  Koleen Distance MS, RD, LDN Please refer to [name] for RD and/or RD on-call/weekend/after hours pager

## 2020-04-09 NOTE — Progress Notes (Signed)
Patient refused to wait on discharge instructions.  Patient taken to medical mall entrance via wheelchair by Megan,CNA.

## 2020-04-09 NOTE — TOC Transition Note (Signed)
Transition of Care [name]) - CM/SW Discharge Note   Patient Details  Name: Tony Long MRN: [id number] Date of Birth: 1975/12/09  [facility] CM/SW Contact:  Marina Goodell Phone Number: [phone] 04/09/2020, 12:53 PM   Clinical Narrative:     CSW will go home with PT and RN with [facility]. CSW sent message to [facility], confirmed acceptance.  CSW sent message to Elease Hashimoto w/ [employer] for 5" wheels walker, confirmed request. TOC consult complete.        Patient Goals and CMS Choice        Discharge Placement                       Discharge Plan and Services                                     Social Determinants of Health (SDOH) Interventions     Readmission Risk Interventions No flowsheet data found.

## 2020-04-09 NOTE — Progress Notes (Signed)
Patient's current CBG is 170. Provider made aware of concerns for another hypoglycemic event since patient received 20u of lantus at 11am. Per Steward Drone NP, hold the sliding scale coverage at this time.

## 2020-04-09 NOTE — Progress Notes (Signed)
[name] non-compliant with safety precautions as patient continues to get out of bed without help. Patient states, he forgot that he has to call for help. Patient verbally agrees to call for help with ambulating or standing on the side of the bed to use the urinal, however, patient does not call for help. [name] very unsteady. Call bell kept within reach and frequent rounds done to insure safety. Bed alarm is on and functioning without difficulties. Call bell kept within reach. [name] awaiting a med/surg bed. Will endorse.

## 2020-04-10 ENCOUNTER — Telehealth: Payer: Self-pay | Admitting: *Deleted

## 2020-04-10 NOTE — Telephone Encounter (Signed)
Transition Care Management Unsuccessful Follow-up Telephone Call  Date of discharge and from where:  04/09/2020 [facility]  Attempts:  1st Attempt  Reason for unsuccessful TCM follow-up call:  Left voice message

## 2020-04-11 ENCOUNTER — Telehealth: Payer: Self-pay | Admitting: *Deleted

## 2020-04-11 NOTE — Telephone Encounter (Signed)
Transition Care Management Unsuccessful Follow-up Telephone Call  Date of discharge and from where:  04/09/2020 [facility]  Attempts:  2nd Attempt  Reason for unsuccessful TCM follow-up call:  Left voice message

## 2020-04-11 NOTE — Telephone Encounter (Signed)
Patient states he will call PCP office to schedule follow up appointment . Tony Long  PEC [phone]

## 2020-04-12 LAB — CULTURE, BLOOD (ROUTINE X 2)
Culture: NO GROWTH
Culture: NO GROWTH
Special Requests: ADEQUATE
Special Requests: ADEQUATE

## 2020-04-12 NOTE — Telephone Encounter (Signed)
Message will be closed as we are outside of the 48 hour window to contact patient.  

## 2020-04-13 ENCOUNTER — Ambulatory Visit: Payer: Medicaid Other

## 2020-04-17 ENCOUNTER — Other Ambulatory Visit: Payer: Self-pay

## 2020-04-17 ENCOUNTER — Ambulatory Visit (INDEPENDENT_AMBULATORY_CARE_PROVIDER_SITE_OTHER): Payer: Medicaid Other | Admitting: Family Medicine

## 2020-04-17 ENCOUNTER — Encounter: Payer: Self-pay | Admitting: Family Medicine

## 2020-04-17 VITALS — BP 113/65 | HR 85 | Temp 98.7°F | Ht 71.0 in | Wt 173.0 lb

## 2020-04-17 DIAGNOSIS — Z09 Encounter for follow-up examination after completed treatment for conditions other than malignant neoplasm: Secondary | ICD-10-CM | POA: Diagnosis not present

## 2020-04-17 DIAGNOSIS — R7309 Other abnormal glucose: Secondary | ICD-10-CM

## 2020-04-17 DIAGNOSIS — R600 Localized edema: Secondary | ICD-10-CM

## 2020-04-17 DIAGNOSIS — R739 Hyperglycemia, unspecified: Secondary | ICD-10-CM | POA: Diagnosis not present

## 2020-04-17 DIAGNOSIS — Z23 Encounter for immunization: Secondary | ICD-10-CM | POA: Diagnosis not present

## 2020-04-17 DIAGNOSIS — E1110 Type 2 diabetes mellitus with ketoacidosis without coma: Secondary | ICD-10-CM

## 2020-04-17 DIAGNOSIS — E119 Type 2 diabetes mellitus without complications: Secondary | ICD-10-CM | POA: Diagnosis not present

## 2020-04-17 DIAGNOSIS — Z794 Long term (current) use of insulin: Secondary | ICD-10-CM

## 2020-04-17 MED ORDER — FUROSEMIDE 20 MG PO TABS: 20.0000 mg | ORAL_TABLET | Freq: Two times a day (BID) | ORAL | 1 refills | Status: DC | PRN

## 2020-04-17 MED ORDER — POTASSIUM CHLORIDE ER 20 MEQ PO TBCR: 20.0000 meq | EXTENDED_RELEASE_TABLET | Freq: Every day | ORAL | 1 refills | Status: DC

## 2020-04-17 NOTE — Progress Notes (Signed)
[facility]  Hospital Follow Up   Subjective:  Patient ID: Tony Long, male    DOB: 10-17-1975  Age: 44 y.o. MRN: [id number]  CC:  Chief Complaint  Patient presents with  . Follow-up    Pt states he is because he was released from the hospital on 04/06/20.-04/09/20. Pt states both legs are still swollen to wear he can bearly bend them. Pt states his groin area is swollen as well. Pt also states he do put his feet up and drinking water and walking as much as he can, but; his legs will not go down.    HPI Tony Long is a 44 year old male presents for Follow Up today.    Patient Active Problem List   Diagnosis Date Noted  . Pressure injury of skin 04/08/2020  . Abscess 04/07/2020  . DKA (diabetic ketoacidosis) (HCC) 04/06/2020  . Hemoglobin A1C greater than 9%, indicating poor diabetic control 08/09/2019  . History of delayed wound healing 08/09/2019  . IV drug user 08/09/2019  . Neuropathy 03/29/2019  . Wound of right foot 03/29/2019  . Hypertension 05/17/2018  . 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 Back abscess 03/21/2018  . Opioid use disorder, severe, dependence (HCC) 03/21/2018  . Osteomyelitis (HCC) 03/20/2018  . Chronic hepatitis C without hepatic coma (HCC) 07/02/2016  . Diabetes (HCC) 03/14/2016    Past Medical History:  Diagnosis Date  . Alcohol use 02/2020  . Diabetes mellitus without complication (HCC)   . Elevated liver enzymes 02/2020  . Elevated liver enzymes 02/2020  . Hyperlipidemia 02/2020  . Hypertension   . IV drug user   . Vitamin D deficiency 02/2020  . Wound healing, delayed    Current Status: Since his last office visit, he has had a Hospital Admission for Diabetic Ketoacidosis from 04/06/2020-04/09/2020. Today, he is doing well with no complaints. He denies fatigue, frequent urination, blurred vision, excessive hunger, excessive thirst, weight gain, weight loss, and poor wound  healing. He continues to check his feet regularly. He denies visual changes, chest pain, cough, shortness of breath, heart palpitations, and falls. He has occasional headaches and dizziness with position changes. Denies severe headaches, confusion, seizures, double vision, and blurred vision, nausea and vomiting. He denies fevers, chills, recent infections, weight loss, and night sweats. Denies GI problems such as diarrhea, and constipation. He has no reports of blood in stools, dysuria and hematuria. No depression or anxiety reported today. He is taking all medications as prescribed. He denies pain today.   Past Surgical History:  Procedure Laterality Date  . INCISION AND DRAINAGE ABSCESS N/A 03/22/2018   Procedure: INCISION AND DRAINAGE ABSCESS;  Surgeon: Violeta Gelinas, MD;  Location: [name] OR;  Service: General;  Laterality: N/A;  . TEE WITHOUT CARDIOVERSION  03/22/2018   Procedure: TRANSESOPHAGEAL ECHOCARDIOGRAM (TEE);  Surgeon: Radiologist, Medication, MD;  Location: [facility] OR;  Service: Open Heart Surgery;;    Family History  Problem Relation Age of Onset  . Heart disease Father   . Diabetes Father   . Diabetes Paternal Uncle     Social History   Socioeconomic History  . Marital status: Single    Spouse name: Not on file  . Number of children: Not on file  . Years of education: Not on file  . Highest education level: Not on file  Occupational History  . Not on file  Tobacco Use  . Smoking status: Current Every Day Smoker  Packs/day: 1.00  . Smokeless tobacco: Never Used  Vaping Use  . Vaping Use: Never used  Substance and Sexual Activity  . Alcohol use: Not Currently    Comment: seldom  . Drug use: No    Comment: pt mother said hx of use  . Sexual activity: Yes    Partners: Female  Other Topics Concern  . Not on file  Social History Narrative  . Not on file   Social Determinants of Health   Financial Resource Strain:   . Difficulty of Paying Living Expenses: Not on  file  Food Insecurity:   . Worried About Programme researcher, broadcasting/film/video in the Last Year: Not on file  . Ran Out of Food in the Last Year: Not on file  Transportation Needs:   . Lack of Transportation (Medical): Not on file  . Lack of Transportation (Non-Medical): Not on file  Physical Activity:   . Days of Exercise per Week: Not on file  . Minutes of Exercise per Session: Not on file  Stress:   . Feeling of Stress : Not on file  Social Connections:   . Frequency of Communication with Friends and Family: Not on file  . Frequency of Social Gatherings with Friends and Family: Not on file  . Attends Religious Services: Not on file  . Active Member of Clubs or Organizations: Not on file  . Attends Banker Meetings: Not on file  . Marital Status: Not on file  Intimate Partner Violence:   . Fear of Current or Ex-Partner: Not on file  . Emotionally Abused: Not on file  . Physically Abused: Not on file  . Sexually Abused: Not on file    Outpatient Medications Prior to Visit  Medication Sig Dispense Refill  . empagliflozin (JARDIANCE) 10 MG TABS tablet Take 10 mg by mouth daily before breakfast. 30 tablet 3  . gabapentin (NEURONTIN) 300 MG capsule Take 2 capsule (600 mg= total) by mouth, 3 times a day. 120 capsule 6  . glucose blood test strip Use as instructed 100 each 12  . Insulin Glargine (LANTUS) 100 UNIT/ML Solostar Pen Inject 50 Units into the skin daily at 10 pm. (Patient taking differently: Inject 30 Units into the skin daily at 10 pm. ) 45 mL 11  . insulin lispro (HUMALOG) 100 UNIT/ML injection Inject 0.1 mLs (10 Units total) into the skin 3 (three) times daily with meals. (Patient taking differently: Inject 10 Units into the skin 3 (three) times daily with meals. PER SLIDING SCALE) 60 mL 11  . Lancets (ONETOUCH ULTRASOFT) lancets Use as instructed 100 each 12  . cyclobenzaprine (FLEXERIL) 5 MG tablet Take 1-2 tablets (5-10 mg total) by mouth 3 (three) times daily as needed for  muscle spasms. (Patient not taking: Reported on 08/08/2019) 20 tablet 0  . lisinopril (ZESTRIL) 5 MG tablet Take 1 tablet (5 mg total) by mouth daily. (Patient not taking: Reported on 02/29/2020) 90 tablet 3  . metoprolol tartrate (LOPRESSOR) 50 MG tablet Take 1 tablet (50 mg total) by mouth 2 (two) times daily. (Patient not taking: Reported on 02/29/2020) 60 tablet 6  . pravastatin (PRAVACHOL) 40 MG tablet Take 1 tablet (40 mg total) by mouth daily. (Patient not taking: Reported on 02/29/2020) 90 tablet 3  . sildenafil (VIAGRA) 25 MG tablet Take 1 tablet (25 mg total) by mouth daily as needed for erectile dysfunction. (Patient not taking: Reported on 02/29/2020) 90 tablet 3  . Vitamin D, Ergocalciferol, (DRISDOL) 1.25 MG (50000  UNIT) CAPS capsule Take 1 capsule (50,000 Units total) by mouth every 7 (seven) days. (Patient not taking: Reported on 04/06/2020) 5 capsule 6   No facility-administered medications prior to visit.    No Known Allergies  ROS Review of Systems  Constitutional: Negative.   HENT: Negative.   Eyes: Negative.   Respiratory: Negative.   Cardiovascular: Negative.   Gastrointestinal: Negative.   Endocrine: Negative.   Genitourinary: Negative.   Musculoskeletal: Positive for arthralgias (generalized joint pain).  Skin: Negative.   Allergic/Immunologic: Negative.   Neurological: Positive for dizziness (occasional ) and headaches (occasional ).  Hematological: Negative.   Psychiatric/Behavioral: Negative.       Objective:    Physical Exam Vitals and nursing note reviewed.  Constitutional:      Appearance: Normal appearance.  HENT:     Head: Normocephalic and atraumatic.     Nose: Nose normal.     Mouth/Throat:     Mouth: Mucous membranes are moist.     Pharynx: Oropharynx is clear.  Cardiovascular:     Rate and Rhythm: Normal rate and regular rhythm.     Pulses: Normal pulses.     Heart sounds: Normal heart sounds.  Pulmonary:     Effort: Pulmonary effort is normal.      Breath sounds: Normal breath sounds.  Abdominal:     General: Bowel sounds are normal. There is distension (occasional ).     Palpations: Abdomen is soft.  Musculoskeletal:        General: Normal range of motion.     Cervical back: Normal range of motion and neck supple.  Skin:    General: Skin is warm and dry.  Neurological:     General: No focal deficit present.     Mental Status: He is alert and oriented to person, place, and time.  Psychiatric:        Mood and Affect: Mood normal.        Behavior: Behavior normal.        Thought Content: Thought content normal.        Judgment: Judgment normal.     BP 113/65 (BP Location: Left Arm, Patient Position: Sitting, Cuff Size: Normal)   Pulse 85   Temp 98.7 F (37.1 C)   Ht 5\' 11"  (1.803 m)   Wt 173 lb (78.5 kg)   SpO2 99%   BMI 24.13 kg/m  Wt Readings from Last 3 Encounters:  04/17/20 173 lb (78.5 kg)  04/07/20 145 lb 8.1 oz (66 kg)  03/30/20 149 lb (67.6 kg)     Health Maintenance Due  Topic Date Due  . PNEUMOCOCCAL POLYSACCHARIDE VACCINE AGE 3-64 HIGH RISK  Never done  . OPHTHALMOLOGY EXAM  Never done  . COVID-19 Vaccine (1) Never done  . FOOT EXAM  09/07/2019    There are no preventive care reminders to display for this patient.  Lab Results  Component Value Date   TSH 2.590 02/29/2020   Lab Results  Component Value Date   WBC 5.2 04/09/2020   HGB 9.0 (L) 04/09/2020   HCT 25.6 (L) 04/09/2020   MCV 100.4 (H) 04/09/2020   PLT 102 (L) 04/09/2020   Lab Results  Component Value Date   NA 135 04/09/2020   K 3.4 (L) 04/09/2020   CO2 25 04/09/2020   GLUCOSE 223 (H) 04/09/2020   BUN 14 04/09/2020   CREATININE 0.58 (L) 04/09/2020   BILITOT 1.4 (H) 04/09/2020   ALKPHOS 335 (H) 04/09/2020   AST  155 (H) 04/09/2020   ALT 45 (H) 04/09/2020   PROT 5.8 (L) 04/09/2020   ALBUMIN 2.5 (L) 04/09/2020   CALCIUM 8.3 (L) 04/09/2020   ANIONGAP 10 04/09/2020   Lab Results  Component Value Date   CHOL 227 (H)  02/29/2020   Lab Results  Component Value Date   HDL 165 02/29/2020   Lab Results  Component Value Date   LDLCALC 50 02/29/2020   Lab Results  Component Value Date   TRIG 70 02/29/2020   Lab Results  Component Value Date   CHOLHDL 1.4 02/29/2020   Lab Results  Component Value Date   HGBA1C 9.1 (A) 02/29/2020   HGBA1C 9.1 02/29/2020   HGBA1C 9.1 (A) 02/29/2020   HGBA1C 9.1 (A) 02/29/2020      Assessment & Plan:   .1. Hospital discharge follow-up  2. Diabetic ketoacidosis without coma associated with type 2 diabetes mellitus (HCC) Stable today.   3. Type 2 diabetes mellitus without complication, with long-term current use of insulin (HCC) He will continue medication as prescribed, to decrease foods/beverages high in sugars and carbs and follow Heart Healthy or DASH diet. Increase physical activity to at least 30 minutes cardio exercise daily.   4. Hemoglobin A1C greater than 9%, indicating poor diabetic control  5. Hyperglycemia  6. Bilateral lower extremity edema - furosemide (LASIX) 20 MG tablet; Take 1 tablet (20 mg total) by mouth 2 (two) times daily as needed.  Dispense: 60 tablet; Refill: 1 - Potassium Chloride ER 20 MEQ TBCR; Take 20 mEq by mouth daily.  Dispense: 30 tablet; Refill: 1  7. Need for vaccination  8. Follow up He will follow up in 5 months.   Meds ordered this encounter  Medications  . furosemide (LASIX) 20 MG tablet    Sig: Take 1 tablet (20 mg total) by mouth 2 (two) times daily as needed.    Dispense:  60 tablet    Refill:  1  . Potassium Chloride ER 20 MEQ TBCR    Sig: Take 20 mEq by mouth daily.    Dispense:  30 tablet    Refill:  1    Orders Placed This Encounter  Procedures  . Flu Vaccine QUAD 6+ mos PF IM (Fluarix Quad PF)  . Tdap vaccine greater than or equal to 7yo IM    Referral Orders  No referral(s) requested today    Raliegh Ip,  MSN, FNP-BC [facility]  [facility] [address] [place], [place] [address] [phone] fax  Problem List Items Addressed This Visit      Endocrine   Diabetes (HCC)   DKA (diabetic ketoacidosis) (HCC)   Hemoglobin A1C greater than 9%, indicating poor diabetic control    Other Visit Diagnoses    Hospital discharge follow-up    -  Primary   Hyperglycemia       Bilateral lower extremity edema       Relevant Medications   furosemide (LASIX) 20 MG tablet   Potassium Chloride ER 20 MEQ TBCR   Need for vaccination       Follow up          Meds ordered this encounter  Medications  . furosemide (LASIX) 20 MG tablet    Sig: Take 1 tablet (20 mg total) by mouth 2 (two) times daily as needed.    Dispense:  60 tablet    Refill:  1  . Potassium Chloride ER 20  MEQ TBCR    Sig: Take 20 mEq by mouth daily.    Dispense:  30 tablet    Refill:  1    Follow-up: No follow-ups on file.    Kallie LocksNatalie M Gladyce Mcray, FNP

## 2020-04-17 NOTE — Patient Instructions (Signed)
Furosemide Oral Tablets What is this medicine? FUROSEMIDE (fyoor [place] se mide) is a diuretic. It helps you make more urine and to lose salt and excess water from your body. It treats swelling from heart, kidney, or liver disease. It also treats high blood pressure. This medicine may be used for other purposes; ask your health care provider or pharmacist if you have questions. COMMON BRAND NAME(S): Active-Medicated Specimen Kit, Delone, Diuscreen, Lasix, RX Specimen Collection Kit, Specimen Collection Kit, URINX Medicated Specimen Collection What should I tell my health care provider before I take this medicine? They need to know if you have any of these conditions:  abnormal blood electrolytes  diarrhea or vomiting  gout  heart disease  kidney disease, small amounts of urine, or difficulty passing urine  liver disease  thyroid disease  an unusual or allergic reaction to furosemide, sulfa drugs, other medicines, foods, dyes, or preservatives  pregnant or trying to get pregnant  breast-feeding How should I use this medicine? Take this drug by mouth. Take it as directed on the prescription label at the same time every day. You can take it with or without food. If it upsets your stomach, take it with food. Keep taking it unless your health care provider tells you to stop. Talk to your health care provider about the use of this drug in children. Special care may be needed. Overdosage: If you think you have taken too much of this medicine contact a poison control center or emergency room at once. NOTE: This medicine is only for you. Do not share this medicine with others. What if I miss a dose? If you miss a dose, take it as soon as you can. If it is almost time for your next dose, take only that dose. Do not take double or extra doses. What may interact with this medicine?  aspirin and aspirin-like medicines  certain antibiotics  chloral  hydrate  cisplatin  cyclosporine  digoxin  diuretics  laxatives  lithium  medicines for blood pressure  medicines that relax muscles for surgery  methotrexate  NSAIDs, medicines for pain and inflammation like ibuprofen, naproxen, or indomethacin  phenytoin  steroid medicines like prednisone or cortisone  sucralfate  thyroid hormones This list may not describe all possible interactions. Give your health care provider a list of all the medicines, herbs, non-prescription drugs, or dietary supplements you use. Also tell them if you smoke, drink alcohol, or use illegal drugs. Some items may interact with your medicine. What should I watch for while using this medicine? Visit your doctor or health care provider for regular checks on your progress. Check your blood pressure regularly. Ask your doctor or health care provider what your blood pressure should be, and when you should contact him or her. If you are a diabetic, check your blood sugar as directed. This medicine may cause serious skin reactions. They can happen weeks to months after starting the medicine. Contact your health care provider right away if you notice fevers or flu-like symptoms with a rash. The rash may be red or purple and then turn into blisters or peeling of the skin. Or, you might notice a red rash with swelling of the face, lips or lymph nodes in your neck or under your arms. You may need to be on a special diet while taking this medicine. Check with your doctor. Also, ask how many glasses of fluid you need to drink a day. You must not get dehydrated. You may get drowsy or  dizzy. Do not drive, use machinery, or do anything that needs mental alertness until you know how this drug affects you. Do not stand or sit up quickly, especially if you are an older patient. This reduces the risk of dizzy or fainting spells. Alcohol can make you more drowsy and dizzy. Avoid alcoholic drinks. This medicine can make you more  sensitive to the sun. Keep out of the sun. If you cannot avoid being in the sun, wear protective clothing and use sunscreen. Do not use sun lamps or tanning beds/booths. What side effects may I notice from receiving this medicine? Side effects that you should report to your doctor or health care professional as soon as possible:  blood in urine or stools  dry mouth  fever or chills  hearing loss or ringing in the ears  irregular heartbeat  muscle pain or weakness, cramps  rash, fever, and swollen lymph nodes  redness, blistering, peeling or loosening of the skin, including inside the mouth  skin rash  stomach upset, pain, or nausea  tingling or numbness in the hands or feet  unusually weak or tired  vomiting or diarrhea  yellowing of the eyes or skin Side effects that usually do not require medical attention (report to your doctor or health care professional if they continue or are bothersome):  headache  loss of appetite  unusual bleeding or bruising This list may not describe all possible side effects. Call your doctor for medical advice about side effects. You may report side effects to FDA at 1-800-FDA-1088. Where should I keep my medicine? Keep out of the reach of children and pets. Store at room temperature between 20 and 25 degrees C (68 and 77 degrees F). Protect from light and moisture. Keep the container tightly closed. Throw away any unused drug after the expiration date. NOTE: This sheet is a summary. It may not cover all possible information. If you have questions about this medicine, talk to your doctor, pharmacist, or health care provider.  2020 Elsevier/Gold Standard (2019-02-01 18:01:32)   Peripheral Edema  Peripheral edema is swelling that is caused by a buildup of fluid. Peripheral edema most often affects the lower legs, ankles, and feet. It can also develop in the arms, hands, and face. The area of the body that has peripheral edema will look  swollen. It may also feel heavy or warm. Your clothes may start to feel tight. Pressing on the area may make a temporary dent in your skin. You may not be able to move your swollen arm or leg as much as usual. There are many causes of peripheral edema. It can happen because of a complication of other conditions such as congestive heart failure, kidney disease, or a problem with your blood circulation. It also can be a side effect of certain medicines or because of an infection. It often happens to women during pregnancy. Sometimes, the cause is not known. Follow these instructions at home: Managing pain, stiffness, and swelling   Raise (elevate) your legs while you are sitting or lying down.  Move around often to prevent stiffness and to lessen swelling.  Do not sit or stand for long periods of time.  Wear support stockings as told by your health care provider. Medicines  Take over-the-counter and prescription medicines only as told by your health care provider.  Your health care provider may prescribe medicine to help your body get rid of excess water (diuretic). General instructions  Pay attention to any changes in your  symptoms.  Follow instructions from your health care provider about limiting salt (sodium) in your diet. Sometimes, eating less salt may reduce swelling.  Moisturize skin daily to help prevent skin from cracking and draining.  Keep all follow-up visits as told by your health care provider. This is important. Contact a health care provider if you have:  A fever.  Edema that starts suddenly or is getting worse, especially if you are pregnant or have a medical condition.  Swelling in only one leg.  Increased swelling, redness, or pain in one or both of your legs.  Drainage or sores at the area where you have edema. Get help right away if you:  Develop shortness of breath, especially when you are lying down.  Have pain in your chest or abdomen.  Feel  weak.  Feel faint. Summary  Peripheral edema is swelling that is caused by a buildup of fluid. Peripheral edema most often affects the lower legs, ankles, and feet.  Move around often to prevent stiffness and to lessen swelling. Do not sit or stand for long periods of time.  Pay attention to any changes in your symptoms.  Contact a health care provider if you have edema that starts suddenly or is getting worse, especially if you are pregnant or have a medical condition.  Get help right away if you develop shortness of breath, especially when lying down. This information is not intended to replace advice given to you by your health care provider. Make sure you discuss any questions you have with your health care provider. Document Revised: 03/10/2018 Document Reviewed: 03/10/2018 Elsevier Patient Education  [place].

## 2020-04-19 ENCOUNTER — Telehealth: Payer: Self-pay | Admitting: Family Medicine

## 2020-04-19 NOTE — Telephone Encounter (Signed)
Called concerning nursing orders for 1w3 2prn

## 2020-04-20 NOTE — Telephone Encounter (Signed)
Patient called asking about lower extremity ultrasound.   He wants to know when will he be scheduled?

## 2020-04-22 ENCOUNTER — Encounter: Payer: Self-pay | Admitting: Family Medicine

## 2020-04-25 ENCOUNTER — Emergency Department
Admission: EM | Admit: 2020-04-25 | Discharge: 2020-04-25 | Discharge status: Absent without leave | Payer: Medicaid Other

## 2020-04-26 ENCOUNTER — Telehealth: Payer: Self-pay

## 2020-04-26 NOTE — Telephone Encounter (Signed)
Transition Care Management Unsuccessful Follow-up Telephone Call  Date of discharge and from where:  04/25/2020 from [facility]  Attempts:  1st Attempt  Reason for unsuccessful TCM follow-up call:  Left voice message

## 2020-04-27 NOTE — Telephone Encounter (Signed)
Transition Care Management Unsuccessful Follow-up Telephone Call  Date of discharge and from where:  04/25/2020 from [name]  Attempts:  2nd Attempt  Reason for unsuccessful TCM follow-up call:  Left voice message

## 2020-04-28 ENCOUNTER — Other Ambulatory Visit: Payer: Self-pay | Admitting: Family Medicine

## 2020-04-28 DIAGNOSIS — G629 Polyneuropathy, unspecified: Secondary | ICD-10-CM

## 2020-04-30 DIAGNOSIS — A0472 Enterocolitis due to Clostridium difficile, not specified as recurrent: Secondary | ICD-10-CM

## 2020-04-30 HISTORY — DX: Enterocolitis due to Clostridium difficile, not specified as recurrent: A04.72

## 2020-04-30 NOTE — Telephone Encounter (Signed)
Transition Care Management Unsuccessful Follow-up Telephone Call  Date of discharge and from where:  04/25/2020 from [facility]  Attempts:  3rd Attempt  Reason for unsuccessful TCM follow-up call:  Unable to reach patient

## 2020-05-01 ENCOUNTER — Other Ambulatory Visit: Payer: Self-pay | Admitting: Family Medicine

## 2020-05-01 ENCOUNTER — Telehealth: Payer: Self-pay | Admitting: Family Medicine

## 2020-05-01 DIAGNOSIS — R600 Localized edema: Secondary | ICD-10-CM

## 2020-05-01 DIAGNOSIS — R197 Diarrhea, unspecified: Secondary | ICD-10-CM

## 2020-05-01 DIAGNOSIS — G629 Polyneuropathy, unspecified: Secondary | ICD-10-CM

## 2020-05-01 DIAGNOSIS — M79604 Pain in right leg: Secondary | ICD-10-CM

## 2020-05-01 DIAGNOSIS — M79605 Pain in left leg: Secondary | ICD-10-CM

## 2020-05-01 MED ORDER — GABAPENTIN 300 MG PO CAPS: ORAL_CAPSULE | ORAL | 6 refills | Status: AC

## 2020-05-01 MED ORDER — LOPERAMIDE HCL 2 MG PO TABS: 2.0000 mg | ORAL_TABLET | Freq: Four times a day (QID) | ORAL | 1 refills | Status: DC | PRN

## 2020-05-02 NOTE — Telephone Encounter (Signed)
Done

## 2020-05-08 ENCOUNTER — Ambulatory Visit (HOSPITAL_COMMUNITY): Payer: Self-pay

## 2020-05-09 ENCOUNTER — Ambulatory Visit (HOSPITAL_COMMUNITY): Payer: Self-pay

## 2020-05-14 ENCOUNTER — Ambulatory Visit (HOSPITAL_COMMUNITY)
Admission: RE | Admit: 2020-05-14 | Discharge: 2020-05-14 | Disposition: A | Discharge status: Home / Self Care | Payer: Medicaid Other | Source: Ambulatory Visit | Attending: Family Medicine | Admitting: Family Medicine

## 2020-05-14 ENCOUNTER — Other Ambulatory Visit: Payer: Self-pay

## 2020-05-14 DIAGNOSIS — M79604 Pain in right leg: Secondary | ICD-10-CM | POA: Diagnosis not present

## 2020-05-14 DIAGNOSIS — R600 Localized edema: Secondary | ICD-10-CM | POA: Insufficient documentation

## 2020-05-14 DIAGNOSIS — M79605 Pain in left leg: Secondary | ICD-10-CM | POA: Insufficient documentation

## 2020-05-14 NOTE — Progress Notes (Signed)
Lower extremity venous has been completed.   Preliminary results in CV Proc.   Blanch Media 05/14/2020 4:03 PM

## 2020-05-15 ENCOUNTER — Ambulatory Visit: Payer: Medicaid Other

## 2020-05-15 ENCOUNTER — Ambulatory Visit: Payer: Self-pay

## 2020-05-15 ENCOUNTER — Telehealth: Payer: Self-pay | Admitting: Family Medicine

## 2020-05-15 ENCOUNTER — Telehealth (INDEPENDENT_AMBULATORY_CARE_PROVIDER_SITE_OTHER): Payer: Medicaid Other | Admitting: Family Medicine

## 2020-05-15 ENCOUNTER — Other Ambulatory Visit: Payer: Self-pay | Admitting: Family Medicine

## 2020-05-15 DIAGNOSIS — R197 Diarrhea, unspecified: Secondary | ICD-10-CM

## 2020-05-15 DIAGNOSIS — Z09 Encounter for follow-up examination after completed treatment for conditions other than malignant neoplasm: Secondary | ICD-10-CM | POA: Diagnosis not present

## 2020-05-15 MED ORDER — DIPHENOXYLATE-ATROPINE 2.5-0.025 MG/5ML PO LIQD: 10.0000 mL | Freq: Four times a day (QID) | ORAL | 0 refills | Status: DC | PRN

## 2020-05-15 NOTE — Progress Notes (Signed)
l °

## 2020-05-15 NOTE — Telephone Encounter (Signed)
Done

## 2020-05-16 ENCOUNTER — Other Ambulatory Visit: Payer: Self-pay | Admitting: Family Medicine

## 2020-05-16 NOTE — Progress Notes (Signed)
Virtual Visit via Telephone Note  I connected with Tony Long on 05/20/20 at  8:00 AM EST by telephone and verified that I am speaking with the correct person using two identifiers.  Location: Patient: Home Provider: Office   I discussed the limitations, risks, security and privacy concerns of performing an evaluation and management service by telephone and the availability of in person appointments. I also discussed with the patient that there may be a patient responsible charge related to this service. The patient expressed understanding and agreed to proceed.   History of Present Illness:  Patient Active Problem List   Diagnosis Date Noted  . Pressure injury of skin 04/08/2020  . Abscess 04/07/2020  . DKA (diabetic ketoacidosis) (HCC) 04/06/2020  . Hemoglobin A1C greater than 9%, indicating poor diabetic control 08/09/2019  . History of delayed wound healing 08/09/2019  . IV drug user 08/09/2019  . Neuropathy 03/29/2019  . Wound of right foot 03/29/2019  . Hypertension 05/17/2018  . 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 Back abscess 03/21/2018  . Opioid use disorder, severe, dependence (HCC) 03/21/2018  . Osteomyelitis (HCC) 03/20/2018  . Chronic hepatitis C without hepatic coma (HCC) 07/02/2016  . Diabetes (HCC) 03/14/2016   Current Status: Since his last office visit, he has c/o increasing diarrhea episodes for the past few days. He has been taking Imodium with no relief. Denies GI problems such as nausea, vomiting, and constipation. He has no reports of blood in stools, dysuria and hematuria. He denies fevers, chills, fatigue, recent infections, weight loss, and night sweats. He has not had any headaches, visual changes, dizziness, and falls. No chest pain, heart palpitations, cough and shortness of breath reported. No depression or anxiety reported today. He is taking all medications as prescribed. He denies pain today.   Observations/Objective:  Telephone  Visit  Assessment and Plan:  1. Acute diarrhea He will begin Lomotil as prescribed. He will report to office if symptoms do not improve or worsen.   2. Diarrhea, unspecified type Order placed for patient to submit stool sample asap.   3. Follow up He will keep follow up appointment.  No orders of the defined types were placed in this encounter.   No orders of the defined types were placed in this encounter.   Referral Orders  No referral(s) requested today    Raliegh Ip,  MSN, FNP-BC [facility] [address] [place], [place] [address] [phone] fax   I discussed the assessment and treatment plan with the patient. The patient was provided an opportunity to ask questions and all were answered. The patient agreed with the plan and demonstrated an understanding of the instructions.   The patient was advised to call back or seek an in-person evaluation if the symptoms worsen or if the condition fails to improve as anticipated.  I provided 20 minutes of non-face-to-face time during this encounter.   [name], FNP

## 2020-05-20 ENCOUNTER — Encounter: Payer: Self-pay | Admitting: Family Medicine

## 2020-05-20 LAB — CDIFF NAA+O+P+STOOL CULTURE
E coli, Shiga toxin Assay: NEGATIVE
Toxigenic C. Difficile by PCR: POSITIVE — AB

## 2020-05-21 ENCOUNTER — Other Ambulatory Visit: Payer: Self-pay | Admitting: Family Medicine

## 2020-05-21 ENCOUNTER — Encounter: Payer: Self-pay | Admitting: Family Medicine

## 2020-05-21 DIAGNOSIS — A0472 Enterocolitis due to Clostridium difficile, not specified as recurrent: Secondary | ICD-10-CM

## 2020-05-21 MED ORDER — METRONIDAZOLE 500 MG PO TABS: 500.0000 mg | ORAL_TABLET | Freq: Three times a day (TID) | ORAL | 0 refills | Status: DC

## 2020-05-27 ENCOUNTER — Inpatient Hospital Stay (HOSPITAL_COMMUNITY)
Admission: EM | Admit: 2020-05-27 | Discharge: 2020-05-30 | DRG: 637 | Disposition: A | Discharge status: Home / Self Care | Payer: Medicaid Other | Attending: Internal Medicine | Admitting: Internal Medicine

## 2020-05-27 ENCOUNTER — Emergency Department (HOSPITAL_COMMUNITY): Payer: Medicaid Other

## 2020-05-27 ENCOUNTER — Other Ambulatory Visit: Payer: Self-pay

## 2020-05-27 DIAGNOSIS — E876 Hypokalemia: Secondary | ICD-10-CM | POA: Diagnosis present

## 2020-05-27 DIAGNOSIS — R609 Edema, unspecified: Secondary | ICD-10-CM | POA: Diagnosis present

## 2020-05-27 DIAGNOSIS — S71101A Unspecified open wound, right thigh, initial encounter: Secondary | ICD-10-CM | POA: Diagnosis present

## 2020-05-27 DIAGNOSIS — A0472 Enterocolitis due to Clostridium difficile, not specified as recurrent: Secondary | ICD-10-CM | POA: Diagnosis present

## 2020-05-27 DIAGNOSIS — E872 Acidosis, unspecified: Secondary | ICD-10-CM | POA: Diagnosis present

## 2020-05-27 DIAGNOSIS — K852 Alcohol induced acute pancreatitis without necrosis or infection: Secondary | ICD-10-CM | POA: Diagnosis present

## 2020-05-27 DIAGNOSIS — E119 Type 2 diabetes mellitus without complications: Secondary | ICD-10-CM

## 2020-05-27 DIAGNOSIS — Z794 Long term (current) use of insulin: Secondary | ICD-10-CM

## 2020-05-27 DIAGNOSIS — F17210 Nicotine dependence, cigarettes, uncomplicated: Secondary | ICD-10-CM | POA: Diagnosis present

## 2020-05-27 DIAGNOSIS — F1010 Alcohol abuse, uncomplicated: Secondary | ICD-10-CM | POA: Diagnosis present

## 2020-05-27 DIAGNOSIS — K760 Fatty (change of) liver, not elsewhere classified: Secondary | ICD-10-CM | POA: Diagnosis not present

## 2020-05-27 DIAGNOSIS — B182 Chronic viral hepatitis C: Secondary | ICD-10-CM | POA: Diagnosis present

## 2020-05-27 DIAGNOSIS — E785 Hyperlipidemia, unspecified: Secondary | ICD-10-CM | POA: Diagnosis present

## 2020-05-27 DIAGNOSIS — R0689 Other abnormalities of breathing: Secondary | ICD-10-CM | POA: Diagnosis not present

## 2020-05-27 DIAGNOSIS — E559 Vitamin D deficiency, unspecified: Secondary | ICD-10-CM | POA: Diagnosis present

## 2020-05-27 DIAGNOSIS — Z20822 Contact with and (suspected) exposure to covid-19: Secondary | ICD-10-CM | POA: Diagnosis present

## 2020-05-27 DIAGNOSIS — Z87898 Personal history of other specified conditions: Secondary | ICD-10-CM | POA: Diagnosis present

## 2020-05-27 DIAGNOSIS — S79921A Unspecified injury of right thigh, initial encounter: Secondary | ICD-10-CM

## 2020-05-27 DIAGNOSIS — E1110 Type 2 diabetes mellitus with ketoacidosis without coma: Principal | ICD-10-CM | POA: Diagnosis present

## 2020-05-27 DIAGNOSIS — E1165 Type 2 diabetes mellitus with hyperglycemia: Secondary | ICD-10-CM | POA: Diagnosis not present

## 2020-05-27 DIAGNOSIS — T383X6A Underdosing of insulin and oral hypoglycemic [antidiabetic] drugs, initial encounter: Secondary | ICD-10-CM | POA: Diagnosis present

## 2020-05-27 DIAGNOSIS — K529 Noninfective gastroenteritis and colitis, unspecified: Secondary | ICD-10-CM

## 2020-05-27 DIAGNOSIS — F1911 Other psychoactive substance abuse, in remission: Secondary | ICD-10-CM | POA: Diagnosis present

## 2020-05-27 DIAGNOSIS — R197 Diarrhea, unspecified: Secondary | ICD-10-CM | POA: Diagnosis not present

## 2020-05-27 DIAGNOSIS — Z79899 Other long term (current) drug therapy: Secondary | ICD-10-CM

## 2020-05-27 DIAGNOSIS — E43 Unspecified severe protein-calorie malnutrition: Secondary | ICD-10-CM | POA: Insufficient documentation

## 2020-05-27 DIAGNOSIS — Z8249 Family history of ischemic heart disease and other diseases of the circulatory system: Secondary | ICD-10-CM

## 2020-05-27 DIAGNOSIS — Z91128 Patient's intentional underdosing of medication regimen for other reason: Secondary | ICD-10-CM

## 2020-05-27 DIAGNOSIS — E1142 Type 2 diabetes mellitus with diabetic polyneuropathy: Secondary | ICD-10-CM | POA: Diagnosis present

## 2020-05-27 DIAGNOSIS — L97119 Non-pressure chronic ulcer of right thigh with unspecified severity: Secondary | ICD-10-CM | POA: Diagnosis present

## 2020-05-27 DIAGNOSIS — R109 Unspecified abdominal pain: Secondary | ICD-10-CM | POA: Diagnosis not present

## 2020-05-27 DIAGNOSIS — I10 Essential (primary) hypertension: Secondary | ICD-10-CM | POA: Diagnosis present

## 2020-05-27 DIAGNOSIS — I959 Hypotension, unspecified: Secondary | ICD-10-CM | POA: Diagnosis not present

## 2020-05-27 DIAGNOSIS — E860 Dehydration: Principal | ICD-10-CM

## 2020-05-27 DIAGNOSIS — Z833 Family history of diabetes mellitus: Secondary | ICD-10-CM

## 2020-05-27 LAB — CBC WITH DIFFERENTIAL/PLATELET
Abs Immature Granulocytes: 0.02 10*3/uL (ref 0.00–0.07)
Basophils Absolute: 0.1 10*3/uL (ref 0.0–0.1)
Basophils Relative: 1 %
Eosinophils Absolute: 0 10*3/uL (ref 0.0–0.5)
Eosinophils Relative: 0 %
HCT: 39.2 % (ref 39.0–52.0)
Hemoglobin: 12.4 g/dL — ABNORMAL LOW (ref 13.0–17.0)
Immature Granulocytes: 0 %
Lymphocytes Relative: 15 %
Lymphs Abs: 1.2 10*3/uL (ref 0.7–4.0)
MCH: 33.6 pg (ref 26.0–34.0)
MCHC: 31.6 g/dL (ref 30.0–36.0)
MCV: 106.2 fL — ABNORMAL HIGH (ref 80.0–100.0)
Monocytes Absolute: 0.4 10*3/uL (ref 0.1–1.0)
Monocytes Relative: 5 %
Neutro Abs: 6.2 10*3/uL (ref 1.7–7.7)
Neutrophils Relative %: 79 %
Platelets: 203 10*3/uL (ref 150–400)
RBC: 3.69 MIL/uL — ABNORMAL LOW (ref 4.22–5.81)
RDW: 14.3 % (ref 11.5–15.5)
WBC: 7.8 10*3/uL (ref 4.0–10.5)
nRBC: 0 % (ref 0.0–0.2)

## 2020-05-27 LAB — COMPREHENSIVE METABOLIC PANEL
ALT: 24 U/L (ref 0–44)
AST: 33 U/L (ref 15–41)
Albumin: 2.5 g/dL — ABNORMAL LOW (ref 3.5–5.0)
Alkaline Phosphatase: 308 U/L — ABNORMAL HIGH (ref 38–126)
Anion gap: 34 — ABNORMAL HIGH (ref 5–15)
BUN: 5 mg/dL — ABNORMAL LOW (ref 6–20)
CO2: 18 mmol/L — ABNORMAL LOW (ref 22–32)
Calcium: 8.3 mg/dL — ABNORMAL LOW (ref 8.9–10.3)
Chloride: 85 mmol/L — ABNORMAL LOW (ref 98–111)
Creatinine, Ser: 1.26 mg/dL — ABNORMAL HIGH (ref 0.61–1.24)
GFR, Estimated: >60 mL/min (ref >60)
Glucose, Bld: 410 mg/dL — ABNORMAL HIGH (ref 70–99)
Potassium: <2 mmol/L — CL (ref 3.5–5.1)
Sodium: 137 mmol/L (ref 135–145)
Total Bilirubin: 1.2 mg/dL (ref 0.3–1.2)
Total Protein: 6.2 g/dL — ABNORMAL LOW (ref 6.5–8.1)

## 2020-05-27 LAB — URINALYSIS, ROUTINE W REFLEX MICROSCOPIC
Bilirubin Urine: NEGATIVE
Glucose, UA: >=500 mg/dL — AB
Hgb urine dipstick: NEGATIVE
Ketones, ur: 80 mg/dL — AB
Leukocytes,Ua: NEGATIVE
Nitrite: NEGATIVE
Protein, ur: 100 mg/dL — AB
Specific Gravity, Urine: 1.015 (ref 1.005–1.030)
pH: 6 (ref 5.0–8.0)

## 2020-05-27 LAB — I-STAT VENOUS BLOOD GAS, ED
Acid-base deficit: 5 mmol/L — ABNORMAL HIGH (ref 0.0–2.0)
Bicarbonate: 19.9 mmol/L — ABNORMAL LOW (ref 20.0–28.0)
Calcium, Ion: 0.99 mmol/L — ABNORMAL LOW (ref 1.15–1.40)
HCT: 41 % (ref 39.0–52.0)
Hemoglobin: 13.9 g/dL (ref 13.0–17.0)
O2 Saturation: 96 %
Potassium: <2 mmol/L — CL (ref 3.5–5.1)
Sodium: 138 mmol/L (ref 135–145)
TCO2: 21 mmol/L — ABNORMAL LOW (ref 22–32)
pCO2, Ven: 36.2 mmHg — ABNORMAL LOW (ref 44.0–60.0)
pH, Ven: 7.35 (ref 7.250–7.430)
pO2, Ven: 89 mmHg — ABNORMAL HIGH (ref 32.0–45.0)

## 2020-05-27 LAB — CBG MONITORING, ED: Glucose-Capillary: 411 mg/dL — ABNORMAL HIGH (ref 70–99)

## 2020-05-27 LAB — APTT: aPTT: 31 seconds (ref 24–36)

## 2020-05-27 LAB — RESP PANEL BY RT-PCR (FLU A&B, COVID) ARPGX2
Influenza A by PCR: NEGATIVE
Influenza B by PCR: NEGATIVE
SARS Coronavirus 2 by RT PCR: NEGATIVE

## 2020-05-27 LAB — MAGNESIUM: Magnesium: 2.1 mg/dL (ref 1.7–2.4)

## 2020-05-27 LAB — PROTIME-INR
INR: 1.1 (ref 0.8–1.2)
Prothrombin Time: 13.8 seconds (ref 11.4–15.2)

## 2020-05-27 LAB — LACTIC ACID, PLASMA: Lactic Acid, Venous: 6.3 mmol/L (ref 0.5–1.9)

## 2020-05-27 LAB — LIPASE, BLOOD: Lipase: 51 U/L (ref 11–51)

## 2020-05-27 LAB — BETA-HYDROXYBUTYRIC ACID: Beta-Hydroxybutyric Acid: >8 mmol/L — ABNORMAL HIGH (ref 0.05–0.27)

## 2020-05-27 MED ORDER — LACTATED RINGERS IV BOLUS
1040.0000 mL | Freq: Once | INTRAVENOUS | Status: AC
  Administered 2020-05-27: 1040 mL via INTRAVENOUS

## 2020-05-27 MED ORDER — VANCOMYCIN 50 MG/ML ORAL SOLUTION
125.0000 mg | Freq: Four times a day (QID) | ORAL | Status: DC
  Administered 2020-05-27 – 2020-05-30 (×9): 125 mg via ORAL
  Filled 2020-05-27 (×13): qty 2.5

## 2020-05-27 MED ORDER — FENTANYL CITRATE (PF) 100 MCG/2ML IJ SOLN
50.0000 ug | Freq: Once | INTRAMUSCULAR | Status: AC
  Administered 2020-05-28: 50 ug via INTRAVENOUS
  Filled 2020-05-27: qty 2

## 2020-05-27 MED ORDER — LACTATED RINGERS IV BOLUS (SEPSIS)
1000.0000 mL | Freq: Once | INTRAVENOUS | Status: AC
  Administered 2020-05-27: 1000 mL via INTRAVENOUS

## 2020-05-27 MED ORDER — POTASSIUM CHLORIDE 10 MEQ/100ML IV SOLN
10.0000 meq | Freq: Once | INTRAVENOUS | Status: AC
  Administered 2020-05-27: 10 meq via INTRAVENOUS
  Filled 2020-05-27: qty 100

## 2020-05-27 MED ORDER — POTASSIUM CHLORIDE 20 MEQ PO PACK
80.0000 meq | PACK | Freq: Two times a day (BID) | ORAL | Status: DC
  Administered 2020-05-27 – 2020-05-28 (×2): 80 meq via ORAL
  Filled 2020-05-27 (×2): qty 4

## 2020-05-27 MED ORDER — IOHEXOL 300 MG/ML  SOLN
100.0000 mL | Freq: Once | INTRAMUSCULAR | Status: AC | PRN
  Administered 2020-05-27: 100 mL via INTRAVENOUS

## 2020-05-27 NOTE — ED Notes (Signed)
Pt transported to CT ?

## 2020-05-27 NOTE — ED Provider Notes (Signed)
[facility] EMERGENCY DEPARTMENT Provider Note   CSN: [id number] Arrival date & time: 05/27/20  4268     History Chief Complaint  Patient presents with  . Diarrhea  . Hypotension    Tony Long is a 44 y.o. male with a history of IV drug use and currently being treated for C. difficile colitis with p.o. Flagyl presents with abdominal pain and diarrhea.  Patient states he has had diarrhea for the past 5 weeks that has not been improving.  Also states that he has not been eating or drinking well so his mother encouraged him to come to the ED.  Reports diffuse abdominal pain.  History is limited as patient is poor historian.  Of note, patient drinks 1/5 of alcohol a day with his last drink this afternoon.  The history is provided by the patient.  Abdominal Pain Pain location:  Generalized Pain quality comment:  "feels bad" Pain radiates to:  Does not radiate Pain severity:  Severe Onset quality:  Gradual Duration:  5 weeks Timing:  Constant Progression:  Worsening Context comment:  C diff infection Relieved by:  Nothing Worsened by:  Nothing Associated symptoms: diarrhea and fatigue   Associated symptoms: no fever, no nausea, no shortness of breath and no vomiting        Past Medical History:  Diagnosis Date  . Alcohol use 02/2020  . Clostridioides difficile diarrhea 04/2020  . Diabetes mellitus without complication ([place])   . Elevated liver enzymes 02/2020  . Elevated liver enzymes 02/2020  . Hyperlipidemia 02/2020  . Hypertension   . IV drug user   . Vitamin D deficiency 02/2020  . Wound healing, delayed     Patient Active Problem List   Diagnosis Date Noted  . Pressure injury of skin 04/08/2020  . Abscess 04/07/2020  . DKA (diabetic ketoacidosis) ([place]) 04/06/2020  . Hemoglobin A1C greater than 9%, indicating poor diabetic control 08/09/2019  . History of delayed wound healing 08/09/2019  . IV drug user 08/09/2019  . Neuropathy 03/29/2019   . Wound of right foot 03/29/2019  . Hypertension 05/17/2018  . 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 Back abscess 03/21/2018  . Opioid use disorder, severe, dependence ([place]) 03/21/2018  . Osteomyelitis ([place]) 03/20/2018  . Chronic hepatitis C without hepatic coma ([place]) 07/02/2016  . Diabetes ([place]) 03/14/2016    Past Surgical History:  Procedure Laterality Date  . INCISION AND DRAINAGE ABSCESS N/A 03/22/2018   Procedure: INCISION AND DRAINAGE ABSCESS;  Surgeon: Georganna Skeans, MD;  Location: [facility];  Service: General;  Laterality: N/A;  . TEE WITHOUT CARDIOVERSION  03/22/2018   Procedure: TRANSESOPHAGEAL ECHOCARDIOGRAM (TEE);  Surgeon: Radiologist, Medication, MD;  Location: [facility] OR;  Service: Open Heart Surgery;;       Family History  Problem Relation Age of Onset  . Heart disease Father   . Diabetes Father   . Diabetes Paternal Uncle     Social History   Tobacco Use  . Smoking status: Current Every Day Smoker    Packs/day: 1.00  . Smokeless tobacco: Never Used  Vaping Use  . Vaping Use: Never used  Substance Use Topics  . Alcohol use: Not Currently    Comment: seldom  . Drug use: No    Comment: pt mother said hx of use    Home Medications Prior to Admission medications   Medication Sig Start Date End Date Taking? Authorizing Provider  cyclobenzaprine (FLEXERIL) 5 MG tablet Take 1-2 tablets (5-10 mg total) by  mouth 3 (three) times daily as needed for muscle spasms. Patient not taking: Reported on 08/08/2019 03/07/18   Duanne Guess, PA-C  diphenoxylate-atropine (LOMOTIL) 2.5-0.025 MG/5ML liquid Take 10 mLs by mouth 4 (four) times daily as needed for diarrhea or loose stools. 05/15/20   Azzie Glatter, FNP  empagliflozin (JARDIANCE) 10 MG TABS tablet Take 10 mg by mouth daily before breakfast. 08/08/19   Azzie Glatter, FNP  furosemide (LASIX) 20 MG tablet Take 1 tablet (20 mg total) by mouth 2 (two) times daily as needed. 04/17/20   Azzie Glatter, FNP   gabapentin (NEURONTIN) 300 MG capsule Take 2 capsule (600 mg= total) by mouth, 3 times a day. 05/01/20   Azzie Glatter, FNP  glucose blood test strip Use as instructed 03/13/16   Micheline Chapman, NP  Insulin Glargine (LANTUS) 100 UNIT/ML Solostar Pen Inject 50 Units into the skin daily at 10 pm. Patient taking differently: Inject 30 Units into the skin daily at 10 pm.  08/08/19   Azzie Glatter, FNP  insulin lispro (HUMALOG) 100 UNIT/ML injection Inject 0.1 mLs (10 Units total) into the skin 3 (three) times daily with meals. Patient taking differently: Inject 10 Units into the skin 3 (three) times daily with meals. PER SLIDING SCALE 08/08/19   Azzie Glatter, FNP  Lancets [facility] ULTRASOFT) lancets Use as instructed 03/13/16   Micheline Chapman, NP  lisinopril (ZESTRIL) 5 MG tablet Take 1 tablet (5 mg total) by mouth daily. Patient not taking: Reported on 02/29/2020 08/08/19   Azzie Glatter, FNP  metoprolol tartrate (LOPRESSOR) 50 MG tablet Take 1 tablet (50 mg total) by mouth 2 (two) times daily. Patient not taking: Reported on 02/29/2020 08/08/19   Azzie Glatter, FNP  metroNIDAZOLE (FLAGYL) 500 MG tablet Take 1 tablet (500 mg total) by mouth 3 (three) times daily for 14 days. 05/21/20 06/04/20  Azzie Glatter, FNP  Potassium Chloride ER 20 MEQ TBCR Take 20 mEq by mouth daily. 04/17/20   Azzie Glatter, FNP  pravastatin (PRAVACHOL) 40 MG tablet Take 1 tablet (40 mg total) by mouth daily. Patient not taking: Reported on 02/29/2020 08/08/19   Azzie Glatter, FNP  sildenafil (VIAGRA) 25 MG tablet Take 1 tablet (25 mg total) by mouth daily as needed for erectile dysfunction. Patient not taking: Reported on 02/29/2020 02/26/18   Azzie Glatter, FNP  Vitamin D, Ergocalciferol, (DRISDOL) 1.25 MG (50000 UNIT) CAPS capsule Take 1 capsule (50,000 Units total) by mouth every 7 (seven) days. Patient not taking: Reported on 04/06/2020 03/09/20   Azzie Glatter, FNP    Allergies    Patient  has no known allergies.  Review of Systems   Review of Systems  Constitutional: Positive for appetite change and fatigue. Negative for fever.  Respiratory: Negative for shortness of breath.   Gastrointestinal: Positive for abdominal pain and diarrhea. Negative for nausea and vomiting.  All other systems reviewed and are negative.   Physical Exam Updated Vital Signs BP (!) 95/59 (BP Location: Right Arm)   Pulse 88   Temp 98.5 F (36.9 C) (Oral)   Physical Exam Vitals and nursing note reviewed.  Constitutional:      General: He is not in acute distress.    Appearance: He is well-developed and underweight. He is ill-appearing. He is not toxic-appearing or diaphoretic.  HENT:     Head: Normocephalic and atraumatic.     Right Ear: External ear normal.  Left Ear: External ear normal.     Mouth/Throat:     Mouth: Mucous membranes are dry.  Eyes:     Conjunctiva/sclera: Conjunctivae normal.  Cardiovascular:     Rate and Rhythm: Normal rate and regular rhythm.     Heart sounds: Murmur heard.   Pulmonary:     Effort: Pulmonary effort is normal. No respiratory distress.     Breath sounds: Normal breath sounds.  Abdominal:     General: There is no distension.     Palpations: Abdomen is soft.     Tenderness: There is abdominal tenderness (Diffuse, moderate/severe). There is guarding.     Comments: Unable to assess for rebound due to lack of patient cooperation  Musculoskeletal:     Cervical back: Neck supple.     Right lower leg: Edema present.     Left lower leg: Edema present.  Skin:    General: Skin is warm and dry.  Neurological:     Mental Status: He is alert and oriented to person, place, and time. Mental status is at baseline.  Psychiatric:        Behavior: Behavior is uncooperative.     ED Results / Procedures / Treatments   Labs (all labs ordered are listed, but only abnormal results are displayed) Labs Reviewed - No data to  display  EKG None  Radiology No results found.  Procedures Ultrasound ED Peripheral IV (Provider)  Date/Time: 05/27/2020 11:33 PM Performed by: Darrick Huntsman, MD Authorized by: Truddie Hidden, MD   Procedure details:    Indications: poor IV access     Skin Prep: chlorhexidine gluconate     Location:  Right AC   Angiocath:  20 G   Bedside Ultrasound Guided: Yes     Images: not archived     Patient tolerated procedure without complications: Yes     Dressing applied: Yes      Medications Ordered in ED Medications - No data to display  ED Course  I have reviewed the triage vital signs and the nursing notes.  Pertinent labs & imaging results that were available during my care of the patient were reviewed by me and considered in my medical decision making (see chart for details).    MDM Rules/Calculators/A&P                         MDM: Tony Long is a 45 y.o. male who presents with diarrhea and decreased p.o. intake as per above. I have reviewed the nursing documentation for past medical history, family history, and social history. Pertinent previous records reviewed. He is awake, alert.  Hypotensive event with EMS and softer pressures on ED arrival. Afebrile. Physical exam is most notable for chronically ill-appearing 44 year old male with diffuse abdominal tenderness on exam.  Labs: UA with glucosuria, ketones >8, Lactic 6.3.  Potassium<2, bicarb 18, glucose 410, creatinine 1.26, alk phos 300, AG 34.  Hemoglobin 12.4, WBC 7.8.  Mag 2.1.  Blood cultures x2 pending.  Covid negative. EKG: NSR. QTC prolonged to 608, QRS 107, ventricular rate 91.  Borderline right axis deviation, likely LVH. No signs of acute ischemia, infarct, or significant electrical abnormalities. No STEMI, ST depressions, or significant T wave inversions. No evidence of a High-Grade Conduction Block, WPW, Brugada Sign, ARVC, DeWinters T Waves, or Wellens Waves. Imaging: CXR demonstrating no  pneumonia.  CT abdomen/pelvis with findings concerning for colitis and possible pancreatitis. Consults: none Tx: Given p.o. vancomycin, p.o. 80  mEq potassium, 10 mEq IV potassium, 30 cc/kg LR bolus (total of 2040 cc).  Fentanyl ordered for pain.  Differential Dx: I am most concerned for hypovolemic shock secondary to GI losses.  Low concern for pancreatitis given lipase of 50 although cannot be clearly given patient's diffuse abdominal pain and CT with findings concerning for pancreatitis.  Patient has a history of endocarditis and has a murmur on exam so this may be contributing but feel that his presentation is more consistent with GI losses. Given history, physical exam, and work-up, I do not think he has ACS, PE, pneumonia, pneumothorax, hemorrhagic shock, or trauma.  MDM: Tony Long is a 44 y.o. male with history of IV drug use presents with decreased p.o. intake and diarrhea.  Found to be hypotensive with EMS with soft pressures in the ED felt likely to be related to GI losses especially given his profound hypokalemia.  No central lines so repleted with p.o. and 10 mEq IV potassium.  Patient afebrile without signs/symptoms of pneumonia, bacteremia, or UTI so patient not given systemic antibiotics. Given hypokalemia, not given insulin for hyperglycemia.  Patient with diffuse abdominal tenderness on exam so obtained CT abdomen/pelvis with findings concerning for colitis and possible pancreatitis.  Given patient's severe hypokalemia, dehydration, possible pancreatitis, and severe C. difficile colitis, patient warrants admission to stepdown unit.  Hospitalist consult for admission, handoff given.  BP is improving to 126/94 with fluids.  Satting 100%.  Admitted in stable condition.  The plan for this patient was discussed with Dr. Karle Starch, who voiced agreement and who oversaw evaluation and treatment of this patient.   Final Clinical Impression(s) / ED Diagnoses Final diagnoses:  None    Rx / DC  Orders ED Discharge Orders    None       Tregan Read, MD 05/28/20 0009    Truddie Hidden, MD 05/28/20 1101

## 2020-05-27 NOTE — ED Notes (Signed)
Date and time results received: 05/27/20 2132 (use smartphrase ".now" to insert current time)  Test: Potassium  Critical Value: <2.0  Name of Provider Notified: Bernette Mayers, MD  Orders Received? Or Actions Taken?:

## 2020-05-27 NOTE — ED Notes (Signed)
Date and time results received: 05/27/20 2126 (use smartphrase ".now" to insert current time)  Test: Lactic Acid Critical Value: 6.3  Name of Provider Notified: Curlene Labrum, MD  Orders Received? Or Actions Taken?:

## 2020-05-27 NOTE — ED Notes (Signed)
Pt reports IV drug use. Difficult stick.

## 2020-05-28 ENCOUNTER — Encounter (HOSPITAL_COMMUNITY): Payer: Self-pay | Admitting: Internal Medicine

## 2020-05-28 ENCOUNTER — Inpatient Hospital Stay: Payer: Self-pay

## 2020-05-28 ENCOUNTER — Observation Stay (HOSPITAL_COMMUNITY): Payer: Medicaid Other

## 2020-05-28 DIAGNOSIS — F17210 Nicotine dependence, cigarettes, uncomplicated: Secondary | ICD-10-CM | POA: Diagnosis not present

## 2020-05-28 DIAGNOSIS — F1911 Other psychoactive substance abuse, in remission: Secondary | ICD-10-CM

## 2020-05-28 DIAGNOSIS — L97119 Non-pressure chronic ulcer of right thigh with unspecified severity: Secondary | ICD-10-CM | POA: Diagnosis not present

## 2020-05-28 DIAGNOSIS — E1110 Type 2 diabetes mellitus with ketoacidosis without coma: Principal | ICD-10-CM

## 2020-05-28 DIAGNOSIS — Z8249 Family history of ischemic heart disease and other diseases of the circulatory system: Secondary | ICD-10-CM | POA: Diagnosis not present

## 2020-05-28 DIAGNOSIS — E876 Hypokalemia: Secondary | ICD-10-CM | POA: Diagnosis not present

## 2020-05-28 DIAGNOSIS — I10 Essential (primary) hypertension: Secondary | ICD-10-CM

## 2020-05-28 DIAGNOSIS — F1010 Alcohol abuse, uncomplicated: Secondary | ICD-10-CM | POA: Diagnosis present

## 2020-05-28 DIAGNOSIS — L03115 Cellulitis of right lower limb: Secondary | ICD-10-CM | POA: Diagnosis not present

## 2020-05-28 DIAGNOSIS — T383X6A Underdosing of insulin and oral hypoglycemic [antidiabetic] drugs, initial encounter: Secondary | ICD-10-CM | POA: Diagnosis not present

## 2020-05-28 DIAGNOSIS — S71101A Unspecified open wound, right thigh, initial encounter: Secondary | ICD-10-CM | POA: Diagnosis present

## 2020-05-28 DIAGNOSIS — Z91128 Patient's intentional underdosing of medication regimen for other reason: Secondary | ICD-10-CM | POA: Diagnosis not present

## 2020-05-28 DIAGNOSIS — E872 Acidosis, unspecified: Secondary | ICD-10-CM | POA: Diagnosis present

## 2020-05-28 DIAGNOSIS — Z20822 Contact with and (suspected) exposure to covid-19: Secondary | ICD-10-CM | POA: Diagnosis not present

## 2020-05-28 DIAGNOSIS — E1142 Type 2 diabetes mellitus with diabetic polyneuropathy: Secondary | ICD-10-CM | POA: Diagnosis present

## 2020-05-28 DIAGNOSIS — Z79899 Other long term (current) drug therapy: Secondary | ICD-10-CM | POA: Diagnosis not present

## 2020-05-28 DIAGNOSIS — E559 Vitamin D deficiency, unspecified: Secondary | ICD-10-CM | POA: Diagnosis not present

## 2020-05-28 DIAGNOSIS — K852 Alcohol induced acute pancreatitis without necrosis or infection: Secondary | ICD-10-CM | POA: Diagnosis present

## 2020-05-28 DIAGNOSIS — A0472 Enterocolitis due to Clostridium difficile, not specified as recurrent: Secondary | ICD-10-CM | POA: Diagnosis present

## 2020-05-28 DIAGNOSIS — Z833 Family history of diabetes mellitus: Secondary | ICD-10-CM | POA: Diagnosis not present

## 2020-05-28 DIAGNOSIS — K529 Noninfective gastroenteritis and colitis, unspecified: Secondary | ICD-10-CM | POA: Diagnosis not present

## 2020-05-28 DIAGNOSIS — Z794 Long term (current) use of insulin: Secondary | ICD-10-CM | POA: Diagnosis not present

## 2020-05-28 DIAGNOSIS — E860 Dehydration: Secondary | ICD-10-CM | POA: Diagnosis not present

## 2020-05-28 DIAGNOSIS — R109 Unspecified abdominal pain: Secondary | ICD-10-CM | POA: Diagnosis not present

## 2020-05-28 DIAGNOSIS — R599 Enlarged lymph nodes, unspecified: Secondary | ICD-10-CM | POA: Diagnosis not present

## 2020-05-28 DIAGNOSIS — B182 Chronic viral hepatitis C: Secondary | ICD-10-CM | POA: Diagnosis not present

## 2020-05-28 DIAGNOSIS — R197 Diarrhea, unspecified: Secondary | ICD-10-CM | POA: Diagnosis present

## 2020-05-28 DIAGNOSIS — E785 Hyperlipidemia, unspecified: Secondary | ICD-10-CM | POA: Diagnosis not present

## 2020-05-28 DIAGNOSIS — R609 Edema, unspecified: Secondary | ICD-10-CM | POA: Diagnosis not present

## 2020-05-28 DIAGNOSIS — K760 Fatty (change of) liver, not elsewhere classified: Secondary | ICD-10-CM | POA: Diagnosis not present

## 2020-05-28 LAB — BASIC METABOLIC PANEL
Anion gap: 19 — ABNORMAL HIGH (ref 5–15)
Anion gap: 17 — ABNORMAL HIGH (ref 5–15)
Anion gap: 21 — ABNORMAL HIGH (ref 5–15)
BUN: <5 mg/dL — ABNORMAL LOW (ref 6–20)
BUN: <5 mg/dL — ABNORMAL LOW (ref 6–20)
BUN: 6 mg/dL (ref 6–20)
CO2: 26 mmol/L (ref 22–32)
CO2: 29 mmol/L (ref 22–32)
CO2: 21 mmol/L — ABNORMAL LOW (ref 22–32)
Calcium: 7.7 mg/dL — ABNORMAL LOW (ref 8.9–10.3)
Calcium: 7.6 mg/dL — ABNORMAL LOW (ref 8.9–10.3)
Calcium: 7.3 mg/dL — ABNORMAL LOW (ref 8.9–10.3)
Chloride: 91 mmol/L — ABNORMAL LOW (ref 98–111)
Chloride: 91 mmol/L — ABNORMAL LOW (ref 98–111)
Chloride: 92 mmol/L — ABNORMAL LOW (ref 98–111)
Creatinine, Ser: 1.19 mg/dL (ref 0.61–1.24)
Creatinine, Ser: 0.98 mg/dL (ref 0.61–1.24)
Creatinine, Ser: 0.79 mg/dL (ref 0.61–1.24)
GFR, Estimated: >60 mL/min (ref >60)
GFR, Estimated: >60 mL/min (ref >60)
GFR, Estimated: >60 mL/min (ref >60)
Glucose, Bld: 266 mg/dL — ABNORMAL HIGH (ref 70–99)
Glucose, Bld: 203 mg/dL — ABNORMAL HIGH (ref 70–99)
Glucose, Bld: 388 mg/dL — ABNORMAL HIGH (ref 70–99)
Potassium: <2 mmol/L — CL (ref 3.5–5.1)
Potassium: <2 mmol/L — CL (ref 3.5–5.1)
Potassium: 2.6 mmol/L — CL (ref 3.5–5.1)
Sodium: 136 mmol/L (ref 135–145)
Sodium: 137 mmol/L (ref 135–145)
Sodium: 134 mmol/L — ABNORMAL LOW (ref 135–145)

## 2020-05-28 LAB — CBC WITH DIFFERENTIAL/PLATELET
Abs Immature Granulocytes: 0.03 10*3/uL (ref 0.00–0.07)
Basophils Absolute: 0 10*3/uL (ref 0.0–0.1)
Basophils Relative: 0 %
Eosinophils Absolute: 0 10*3/uL (ref 0.0–0.5)
Eosinophils Relative: 0 %
HCT: 30.6 % — ABNORMAL LOW (ref 39.0–52.0)
Hemoglobin: 10.2 g/dL — ABNORMAL LOW (ref 13.0–17.0)
Immature Granulocytes: 0 %
Lymphocytes Relative: 12 %
Lymphs Abs: 1 10*3/uL (ref 0.7–4.0)
MCH: 34.5 pg — ABNORMAL HIGH (ref 26.0–34.0)
MCHC: 33.3 g/dL (ref 30.0–36.0)
MCV: 103.4 fL — ABNORMAL HIGH (ref 80.0–100.0)
Monocytes Absolute: 0.3 10*3/uL (ref 0.1–1.0)
Monocytes Relative: 3 %
Neutro Abs: 7.5 10*3/uL (ref 1.7–7.7)
Neutrophils Relative %: 85 %
Platelets: 149 10*3/uL — ABNORMAL LOW (ref 150–400)
RBC: 2.96 MIL/uL — ABNORMAL LOW (ref 4.22–5.81)
RDW: 14.1 % (ref 11.5–15.5)
WBC: 8.8 10*3/uL (ref 4.0–10.5)
nRBC: 0 % (ref 0.0–0.2)

## 2020-05-28 LAB — GLUCOSE, CAPILLARY
Glucose-Capillary: 401 mg/dL — ABNORMAL HIGH (ref 70–99)
Glucose-Capillary: 422 mg/dL — ABNORMAL HIGH (ref 70–99)
Glucose-Capillary: 428 mg/dL — ABNORMAL HIGH (ref 70–99)
Glucose-Capillary: 312 mg/dL — ABNORMAL HIGH (ref 70–99)

## 2020-05-28 LAB — CBG MONITORING, ED
Glucose-Capillary: 451 mg/dL — ABNORMAL HIGH (ref 70–99)
Glucose-Capillary: 363 mg/dL — ABNORMAL HIGH (ref 70–99)
Glucose-Capillary: 333 mg/dL — ABNORMAL HIGH (ref 70–99)
Glucose-Capillary: 301 mg/dL — ABNORMAL HIGH (ref 70–99)
Glucose-Capillary: 218 mg/dL — ABNORMAL HIGH (ref 70–99)

## 2020-05-28 LAB — HEMOGLOBIN A1C
Hgb A1c MFr Bld: 8.8 % — ABNORMAL HIGH (ref 4.8–5.6)
Mean Plasma Glucose: 205.86 mg/dL

## 2020-05-28 LAB — BETA-HYDROXYBUTYRIC ACID
Beta-Hydroxybutyric Acid: 5.44 mmol/L — ABNORMAL HIGH (ref 0.05–0.27)
Beta-Hydroxybutyric Acid: >8 mmol/L — ABNORMAL HIGH (ref 0.05–0.27)

## 2020-05-28 LAB — LACTIC ACID, PLASMA: Lactic Acid, Venous: 2.1 mmol/L (ref 0.5–1.9)

## 2020-05-28 MED ORDER — DEXTROSE IN LACTATED RINGERS 5 % IV SOLN: INTRAVENOUS | Status: DC

## 2020-05-28 MED ORDER — ACETAMINOPHEN 325 MG PO TABS: 650.0000 mg | ORAL_TABLET | Freq: Four times a day (QID) | ORAL | Status: DC | PRN

## 2020-05-28 MED ORDER — GABAPENTIN 300 MG PO CAPS
600.0000 mg | ORAL_CAPSULE | Freq: Three times a day (TID) | ORAL | Status: DC
  Administered 2020-05-28 – 2020-05-30 (×8): 600 mg via ORAL
  Filled 2020-05-28 (×8): qty 2

## 2020-05-28 MED ORDER — SODIUM CHLORIDE 0.9 % IV SOLN: INTRAVENOUS | Status: DC

## 2020-05-28 MED ORDER — INSULIN ASPART 100 UNIT/ML ~~LOC~~ SOLN
0.0000 [IU] | Freq: Every day | SUBCUTANEOUS | Status: DC
  Administered 2020-05-28: 4 [IU] via SUBCUTANEOUS
  Administered 2020-05-29: 2 [IU] via SUBCUTANEOUS

## 2020-05-28 MED ORDER — ADULT MULTIVITAMIN W/MINERALS CH
1.0000 | ORAL_TABLET | Freq: Every day | ORAL | Status: DC
  Administered 2020-05-28 – 2020-05-30 (×3): 1 via ORAL
  Filled 2020-05-28 (×3): qty 1

## 2020-05-28 MED ORDER — LACTATED RINGERS IV SOLN: INTRAVENOUS | Status: DC

## 2020-05-28 MED ORDER — SODIUM CHLORIDE 0.9 % IV BOLUS
1000.0000 mL | Freq: Once | INTRAVENOUS | Status: AC
  Administered 2020-05-28: 1000 mL via INTRAVENOUS

## 2020-05-28 MED ORDER — POTASSIUM CHLORIDE 10 MEQ/100ML IV SOLN
10.0000 meq | INTRAVENOUS | Status: AC
  Administered 2020-05-28 (×3): 10 meq via INTRAVENOUS
  Filled 2020-05-28 (×3): qty 100

## 2020-05-28 MED ORDER — LORAZEPAM 2 MG/ML IJ SOLN: 1.0000 mg | INTRAMUSCULAR | Status: DC | PRN

## 2020-05-28 MED ORDER — CHLORHEXIDINE GLUCONATE CLOTH 2 % EX PADS
6.0000 | MEDICATED_PAD | Freq: Every day | CUTANEOUS | Status: DC
  Administered 2020-05-28 – 2020-05-29 (×2): 6 via TOPICAL

## 2020-05-28 MED ORDER — ENOXAPARIN SODIUM 40 MG/0.4ML ~~LOC~~ SOLN
40.0000 mg | SUBCUTANEOUS | Status: DC
  Administered 2020-05-28 – 2020-05-29 (×2): 40 mg via SUBCUTANEOUS
  Filled 2020-05-28 (×2): qty 0.4

## 2020-05-28 MED ORDER — ENSURE ENLIVE PO LIQD: 237.0000 mL | Freq: Two times a day (BID) | ORAL | Status: DC

## 2020-05-28 MED ORDER — INSULIN REGULAR(HUMAN) IN NACL 100-0.9 UT/100ML-% IV SOLN
INTRAVENOUS | Status: DC
  Administered 2020-05-28: 8 [IU]/h via INTRAVENOUS
  Filled 2020-05-28: qty 100

## 2020-05-28 MED ORDER — POTASSIUM CHLORIDE 10 MEQ/100ML IV SOLN
10.0000 meq | INTRAVENOUS | Status: AC
  Administered 2020-05-28 (×5): 10 meq via INTRAVENOUS
  Filled 2020-05-28 (×5): qty 100

## 2020-05-28 MED ORDER — POTASSIUM CHLORIDE CRYS ER 20 MEQ PO TBCR
40.0000 meq | EXTENDED_RELEASE_TABLET | ORAL | Status: AC
  Administered 2020-05-28 (×4): 40 meq via ORAL
  Filled 2020-05-28 (×4): qty 2

## 2020-05-28 MED ORDER — DEXTROSE-NACL 5-0.9 % IV SOLN: INTRAVENOUS | Status: DC

## 2020-05-28 MED ORDER — LORAZEPAM 1 MG PO TABS: 1.0000 mg | ORAL_TABLET | ORAL | Status: DC | PRN

## 2020-05-28 MED ORDER — INSULIN ASPART 100 UNIT/ML ~~LOC~~ SOLN
15.0000 [IU] | Freq: Once | SUBCUTANEOUS | Status: AC
  Administered 2020-05-28: 15 [IU] via SUBCUTANEOUS

## 2020-05-28 MED ORDER — POTASSIUM CHLORIDE CRYS ER 20 MEQ PO TBCR
40.0000 meq | EXTENDED_RELEASE_TABLET | ORAL | Status: DC
Start: 2020-05-28 — End: 2020-05-28

## 2020-05-28 MED ORDER — FOLIC ACID 1 MG PO TABS
1.0000 mg | ORAL_TABLET | Freq: Every day | ORAL | Status: DC
  Administered 2020-05-28 – 2020-05-30 (×3): 1 mg via ORAL
  Filled 2020-05-28 (×3): qty 1

## 2020-05-28 MED ORDER — POTASSIUM CHLORIDE 10 MEQ/100ML IV SOLN: 10.0000 meq | INTRAVENOUS | Status: DC

## 2020-05-28 MED ORDER — POTASSIUM CHLORIDE 10 MEQ/50ML IV SOLN
10.0000 meq | INTRAVENOUS | Status: AC
  Administered 2020-05-28 (×6): 10 meq via INTRAVENOUS
  Filled 2020-05-28 (×5): qty 50

## 2020-05-28 MED ORDER — INSULIN ASPART 100 UNIT/ML ~~LOC~~ SOLN
0.0000 [IU] | Freq: Three times a day (TID) | SUBCUTANEOUS | Status: DC
  Administered 2020-05-29: 11 [IU] via SUBCUTANEOUS
  Administered 2020-05-29: 7 [IU] via SUBCUTANEOUS
  Administered 2020-05-29: 15 [IU] via SUBCUTANEOUS
  Administered 2020-05-30: 7 [IU] via SUBCUTANEOUS

## 2020-05-28 MED ORDER — PROCHLORPERAZINE EDISYLATE 10 MG/2ML IJ SOLN: 10.0000 mg | Freq: Four times a day (QID) | INTRAMUSCULAR | Status: DC | PRN

## 2020-05-28 MED ORDER — THIAMINE HCL 100 MG PO TABS
100.0000 mg | ORAL_TABLET | Freq: Every day | ORAL | Status: DC
  Administered 2020-05-28 – 2020-05-30 (×3): 100 mg via ORAL
  Filled 2020-05-28 (×3): qty 1

## 2020-05-28 MED ORDER — THIAMINE HCL 100 MG/ML IJ SOLN: 100.0000 mg | Freq: Every day | INTRAMUSCULAR | Status: DC

## 2020-05-28 MED ORDER — SODIUM CHLORIDE 0.9% FLUSH: 10.0000 mL | INTRAVENOUS | Status: DC | PRN

## 2020-05-28 MED ORDER — DEXTROSE 50 % IV SOLN: 0.0000 mL | INTRAVENOUS | Status: DC | PRN

## 2020-05-28 MED ORDER — ACETAMINOPHEN 650 MG RE SUPP: 650.0000 mg | Freq: Four times a day (QID) | RECTAL | Status: DC | PRN

## 2020-05-28 MED ORDER — POTASSIUM CHLORIDE IN NACL 20-0.9 MEQ/L-% IV SOLN
INTRAVENOUS | Status: DC
  Filled 2020-05-28 (×3): qty 1000

## 2020-05-28 NOTE — Progress Notes (Signed)
Peripherally Inserted Central Catheter Placement  The IV Nurse has discussed with the patient and/or persons authorized to consent for the patient, the purpose of this procedure and the potential benefits and risks involved with this procedure.  The benefits include less needle sticks, lab draws from the catheter, and the patient may be discharged home with the catheter. Risks include, but not limited to, infection, bleeding, blood clot (thrombus formation), and puncture of an artery; nerve damage and irregular heartbeat and possibility to perform a PICC exchange if needed/ordered by physician.  Alternatives to this procedure were also discussed.  Bard Power PICC patient education guide, fact sheet on infection prevention and patient information card has been provided to patient /or left at bedside.    PICC Placement Documentation  PICC Double Lumen 05/28/20 PICC Right Basilic 40 cm (Active)  Exposed Catheter (cm) 0 cm 05/28/20 1234  Site Assessment Clean;Dry;Intact 05/28/20 1234  Lumen #1 Status Blood return noted;Saline locked 05/28/20 1234  Lumen #2 Status Blood return noted;Saline locked 05/28/20 1234  Dressing Type Transparent;Securing device 05/28/20 1234  Dressing Status Clean;Dry;Intact 05/28/20 1234  Antimicrobial disc in place? Yes 05/28/20 1234  Safety Lock Not Applicable 05/28/20 1234  Dressing Change Due 06/04/20 05/28/20 1234       Romie Jumper 05/28/2020, 12:37 PM

## 2020-05-28 NOTE — ED Notes (Signed)
Attempted PIV insertion x2 with pt's permission, no success.

## 2020-05-28 NOTE — ED Notes (Signed)
IV team at bedside 

## 2020-05-28 NOTE — ED Notes (Signed)
[place], Alvira Philips DO at bedside.

## 2020-05-28 NOTE — ED Notes (Signed)
Had Diplomatic Services operational officer call [facility]

## 2020-05-28 NOTE — ED Notes (Signed)
Lunch Tray Ordered @ 1027. °

## 2020-05-28 NOTE — ED Provider Notes (Signed)
.  Critical Care Performed by: Pollyann Savoy, MD Authorized by: Pollyann Savoy, MD   Critical care provider statement:    Critical care time (minutes):  45   Critical care was necessary to treat or prevent imminent or life-threatening deterioration of the following conditions:  Metabolic crisis and endocrine crisis   Critical care was time spent personally by me on the following activities:  Discussions with consultants, evaluation of patient's response to treatment, examination of patient, ordering and performing treatments and interventions, ordering and review of laboratory studies, ordering and review of radiographic studies, pulse oximetry, re-evaluation of patient's condition, obtaining history from patient or surrogate and review of old charts      Pollyann Savoy, MD 05/28/20 1102

## 2020-05-28 NOTE — ED Notes (Signed)
IV team at bedside to place PICC line. Per IV team RN, Melburn Hake was here and she told [facility] to come back d/t her placing the line. Will call [facility] dispatch when they have placed the line

## 2020-05-28 NOTE — ED Notes (Signed)
Secretary has been regarded for calling [facility].

## 2020-05-28 NOTE — H&P (Signed)
History and Physical    Anurag Scarfo YSA:[id number] DOB: 24-Nov-1975 DOA: 05/27/2020  PCP: [name], FNP  Patient coming from: Home   Chief Complaint:  Chief Complaint  Patient presents with  . Diarrhea  . Hypotension     HPI:    45 year old male with past medical history of IV drug use (abstinent x 1 year), infective endocarditis (03/2018), hypertension, chronic hepatitis C, insulin-dependent diabetes mellitus type 2, peripheral neuropathy presents to [facility] emergency department complaints of weakness and diarrhea.  Of note, patient was hospitalized at [facility] October 8 until October 11 of 2021 for diabetic ketoacidosis and suspected soft tissue infection with anterior chest wall abscesses.  Patient was initially treated with oral Augmentin prior to hospitalization, followed by a course of intravenous vancomycin during that hospitalization followed by several more days of oral doxycycline at time of discharge.  Patient explains that shortly after this hospitalization began to develop watery diarrhea.  This watery diarrhea has been occurring at least 6-8 times daily without any evidence of blood she has been experiencing associated generalized abdominal pain that he describes as sore in quality and moderate to severe in intensity.    Patient's diarrhea persisted for several weeks until the patient eventually spoke to his primary care provider via telemedicine evaluation on 11/2.  At that time, patient was initiated on as needed loperamide.  Patient did not see an improvement with this medication for stool studies were obtained on 11/16 which was positive for C. difficile.  Patient was prescribed a course of oral Flagyl on 1/22.  Patient denies any fevers, sick contacts, nausea, vomiting, recent travel or recent ingestion of undercooked food.  Despite the patient taking as needed Flagyl he continued to exhibit worsening symptoms.  Patient  states that by Thanksgiving, he began to develop intense generalized weakness and progressively worsening poor appetite.  Patient symptoms continue to worsen until he eventually presented to [facility] emergency department for evaluation.  Upon evaluation in the emergency department patient was found to clinically be extremely volume depleted with mild diabetic ketoacidosis and severe hypokalemia with a potassium of less than 2.  Patient was initiated on oral vancomycin therapy, potassium replacement therapy, intravenous fluids and the hospitalist group was then called to assess the patient for admission to the hospital. Review of Systems:   Review of Systems  Constitutional: Positive for malaise/fatigue.  Gastrointestinal: Positive for abdominal pain and diarrhea.  Neurological: Positive for weakness.  All other systems reviewed and are negative.   Past Medical History:  Diagnosis Date  . Alcohol use 02/2020  . Clostridioides difficile diarrhea 04/2020  . Diabetes mellitus without complication (HCC)   . Elevated liver enzymes 02/2020  . Elevated liver enzymes 02/2020  . Hyperlipidemia 02/2020  . Hypertension   . IV drug user   . Vitamin D deficiency 02/2020  . Wound healing, delayed     Past Surgical History:  Procedure Laterality Date  . INCISION AND DRAINAGE ABSCESS N/A 03/22/2018   Procedure: INCISION AND DRAINAGE ABSCESS;  Surgeon: Violeta Gelinas, MD;  Location: [facility] OR;  Service: General;  Laterality: N/A;  . TEE WITHOUT CARDIOVERSION  03/22/2018   Procedure: TRANSESOPHAGEAL ECHOCARDIOGRAM (TEE);  Surgeon: Radiologist, Medication, MD;  Location: [facility] OR;  Service: Open Heart Surgery;;     reports that he has been smoking. He has been smoking about 1.00 pack per day. He has never used smokeless tobacco. He reports current alcohol use. He reports previous drug  use. Drug: Cocaine.  No Known Allergies  Family History  Problem Relation Age of Onset  . Heart disease  Father   . Diabetes Father   . Diabetes Paternal Uncle      Prior to Admission medications   Medication Sig Start Date End Date Taking? Authorizing Provider  cyclobenzaprine (FLEXERIL) 5 MG tablet Take 1-2 tablets (5-10 mg total) by mouth 3 (three) times daily as needed for muscle spasms. Patient not taking: Reported on 08/08/2019 03/07/18   Evon Slack, PA-C  diphenoxylate-atropine (LOMOTIL) 2.5-0.025 MG/5ML liquid Take 10 mLs by mouth 4 (four) times daily as needed for diarrhea or loose stools. 05/15/20   [name], FNP  empagliflozin (JARDIANCE) 10 MG TABS tablet Take 10 mg by mouth daily before breakfast. 08/08/19   [name], FNP  furosemide (LASIX) 20 MG tablet Take 1 tablet (20 mg total) by mouth 2 (two) times daily as needed. 04/17/20   [name], FNP  gabapentin (NEURONTIN) 300 MG capsule Take 2 capsule (600 mg= total) by mouth, 3 times a day. 05/01/20   [name], FNP  glucose blood test strip Use as instructed 03/13/16   Henrietta Hoover, NP  Insulin Glargine (LANTUS) 100 UNIT/ML Solostar Pen Inject 50 Units into the skin daily at 10 pm. Patient taking differently: Inject 30 Units into the skin daily at 10 pm.  08/08/19   [name], FNP  insulin lispro (HUMALOG) 100 UNIT/ML injection Inject 0.1 mLs (10 Units total) into the skin 3 (three) times daily with meals. Patient taking differently: Inject 10 Units into the skin 3 (three) times daily with meals. PER SLIDING SCALE 08/08/19   [name], FNP  Lancets [facility] ULTRASOFT) lancets Use as instructed 03/13/16   Henrietta Hoover, NP  lisinopril (ZESTRIL) 5 MG tablet Take 1 tablet (5 mg total) by mouth daily. Patient not taking: Reported on 02/29/2020 08/08/19   [name], FNP  metoprolol tartrate (LOPRESSOR) 50 MG tablet Take 1 tablet (50 mg total) by mouth 2 (two) times daily. Patient not taking: Reported on 02/29/2020 08/08/19   [name], FNP  metroNIDAZOLE (FLAGYL) 500 MG  tablet Take 1 tablet (500 mg total) by mouth 3 (three) times daily for 14 days. 05/21/20 06/04/20  [name], FNP  Potassium Chloride ER 20 MEQ TBCR Take 20 mEq by mouth daily. 04/17/20   [name], FNP  pravastatin (PRAVACHOL) 40 MG tablet Take 1 tablet (40 mg total) by mouth daily. Patient not taking: Reported on 02/29/2020 08/08/19   [name], FNP  sildenafil (VIAGRA) 25 MG tablet Take 1 tablet (25 mg total) by mouth daily as needed for erectile dysfunction. Patient not taking: Reported on 02/29/2020 02/26/18   [name], FNP  Vitamin D, Ergocalciferol, (DRISDOL) 1.25 MG (50000 UNIT) CAPS capsule Take 1 capsule (50,000 Units total) by mouth every 7 (seven) days. Patient not taking: Reported on 04/06/2020 03/09/20   [name], FNP    Physical Exam: Vitals:   05/28/20 0000 05/28/20 0015 05/28/20 0030 05/28/20 0045  BP: (!) 131/93 (!) 145/95 (!) 126/91 (!) 145/90  Pulse: 100 (!) 106 (!) 108 100  Resp: Temp:      TempSrc:      SpO2: 100% 100% 98% 99%  Weight:      Height:        Constitutional: Lethargic but arousable and oriented x3.  [name] in mild distress  due to abdominal discomfort.   Skin: Notable shallow ulceration over the anterior right thigh measuring approximately 1 cm in diameter with surrounding erythema without significant fluctuance or warmth noted.  Extremely poor skin turgor noted.  Eyes: Pupils are equally reactive to light.  No evidence of scleral icterus or conjunctival pallor.  ENMT: Extremely dry mucous membranes noted.  Posterior pharynx clear of any exudate or lesions.   Neck: normal, supple, no masses, no thyromegaly.  No evidence of jugular venous distension.   Respiratory: clear to auscultation bilaterally, no wheezing, no crackles. Normal respiratory effort. No accessory muscle use.  Cardiovascular: Tachycardic rate and regular rhythm, no murmurs / rubs / gallops.  +1 pitting edema of the distal bilateral lower  extremities.  2+ pedal pulses. No carotid bruits.  Chest:   Nontender without crepitus or deformity.   Back:   Nontender without crepitus or deformity. Abdomen: Generalized abdominal tenderness noted.  Abdomen is soft.  Notably hyperactive bowel sounds diffusely.  No evidence of intra-abdominal masses.  Musculoskeletal: Notable tenderness over the anterior right thigh as detailed further in the skin examination.  Notable tenderness of the distal bilateral lower extremities.  No joint deformity upper and lower extremities. Good ROM, no contractures. Normal muscle tone.  Neurologic: CN 2-12 grossly intact. Sensation intact.  Patient moving all 4 extremities spontaneously.  [name] following all commands.  [name] responsive to verbal stimuli.   Psychiatric: Patient exhibits normal mood with somewhat labile affect.  Patient seems to possess insight as to their current situation.     Labs on Admission: I have personally reviewed following labs and imaging studies -   CBC: Recent Labs  Lab 05/27/20 2019 05/27/20 2051  WBC 7.8  --   NEUTROABS 6.2  --   HGB 12.4* 13.9  HCT 39.2 41.0  MCV 106.2*  --   PLT 203  --    Basic Metabolic Panel: Recent Labs  Lab 05/27/20 2019 05/27/20 2051 05/27/20 2209  NA 137 138  --   K <2.0* <2.0*  --   CL 85*  --   --   CO2 18*  --   --   GLUCOSE 410*  --   --   BUN 5*  --   --   CREATININE 1.26*  --   --   CALCIUM 8.3*  --   --   MG  --   --  2.1   GFR: Estimated Creatinine Clearance: 72.7 mL/min (A) (by C-G formula based on SCr of 1.26 mg/dL (H)). Liver Function Tests: Recent Labs  Lab 05/27/20 2019  AST 33  ALT 24  ALKPHOS 308*  BILITOT 1.2  PROT 6.2*  ALBUMIN 2.5*   Recent Labs  Lab 05/27/20 2019  LIPASE 51   No results for input(s): AMMONIA in the last 168 hours. Coagulation Profile: Recent Labs  Lab 05/27/20 2019  INR 1.1   Cardiac Enzymes: No results for input(s): CKTOTAL, CKMB, CKMBINDEX, TROPONINI in the last 168  hours. BNP (last 3 results) No results for input(s): PROBNP in the last 8760 hours. HbA1C: No results for input(s): HGBA1C in the last 72 hours. CBG: Recent Labs  Lab 05/27/20 2143  GLUCAP 411*   Lipid Profile: No results for input(s): CHOL, HDL, LDLCALC, TRIG, CHOLHDL, LDLDIRECT in the last 72 hours. Thyroid Function Tests: No results for input(s): TSH, T4TOTAL, FREET4, T3FREE, THYROIDAB in the last 72 hours. Anemia Panel: No results for input(s): VITAMINB12, FOLATE, FERRITIN, TIBC, IRON, RETICCTPCT in the last  72 hours. Urine analysis:    Component Value Date/Time   COLORURINE YELLOW 05/27/2020 2229   APPEARANCEUR CLEAR 05/27/2020 2229   LABSPEC 1.015 05/27/2020 2229   PHURINE 6.0 05/27/2020 2229   GLUCOSEU >=500 (A) 05/27/2020 2229   HGBUR NEGATIVE 05/27/2020 2229   BILIRUBINUR NEGATIVE 05/27/2020 2229   BILIRUBINUR neg 02/29/2020 0918   KETONESUR 80 (A) 05/27/2020 2229   PROTEINUR 100 (A) 05/27/2020 2229   UROBILINOGEN 1.0 02/29/2020 0918   UROBILINOGEN 1.0 12/11/2016 1020   NITRITE NEGATIVE 05/27/2020 2229   LEUKOCYTESUR NEGATIVE 05/27/2020 2229    Radiological Exams on Admission - Personally Reviewed: CT ABDOMEN PELVIS W CONTRAST  Result Date: 05/27/2020 CLINICAL DATA:  Diffuse abdominal pain, diarrhea, C diff colitis EXAM: CT ABDOMEN AND PELVIS WITH CONTRAST TECHNIQUE: Multidetector CT imaging of the abdomen and pelvis was performed using the standard protocol following bolus administration of intravenous contrast. CONTRAST:  OMNIPAQUE IOHEXOL 300 MG/ML  SOLN COMPARISON:  None. FINDINGS: Lower chest: Mild left basilar scarring. The visualized lung bases are otherwise clear. The visualized heart and pericardium are unremarkable Hepatobiliary: Severe hepatic steatosis. No enhancing liver lesions. Liver size within normal limits. No intra or extrahepatic biliary ductal dilation. Gallbladder unremarkable. Pancreas: There is mild peripancreatic edema identified  surrounding the head of the pancreas, particularly within the pancreatico duodenal groove, suggesting changes of a mild pancreatitis. Normal enhancement of the pancreatic parenchyma. No pancreatic ductal dilation. No parenchymal calcifications. No loculated peripancreatic fluid collections are identified. Spleen: Unremarkable Adrenals/Urinary Tract: Adrenal glands are unremarkable. Kidneys are normal, without renal calculi, focal lesion, or hydronephrosis. Bladder is unremarkable. Stomach/Bowel: The large bowel demonstrates diffuse, circumferential wall thickening in keeping with the given history of C diff colitis. No significant colonic dilation. No differential enhancement. No evidence of obstruction or perforation. There is circumferential wall thickening involving the terminal ileum just proximal to the ileocecal junction likely representing backwash ileitis. The stomach and small bowel are otherwise unremarkable. No free intraperitoneal gas or fluid. Vascular/Lymphatic: Mild aortoiliac atherosclerotic calcification. No aortic aneurysm. The abdominal vasculature is otherwise unremarkable. No pathologic adenopathy. Reproductive: Prostate is unremarkable. Other: Circumferential wall thickening of the rectum is in keeping with the given history of infectious proctocolitis. Musculoskeletal: No acute bone abnormality. Bilateral L5 pars defects are identified with minimal anterolisthesis of L5 upon S1. T12 butterfly vertebra noted. No lytic or blastic bone lesion. IMPRESSION: Peripancreatic edema surrounding the pancreatic head and within the pancreatico duodenal groove in keeping with changes of mild uncomplicated pancreatitis. Diffuse bowel wall thickening involving the colon and terminal ileum in keeping with changes of infectious proctocolitis and probable backwash ileitis. No evidence of a bowel infarction, perforation, or obstruction. Severe hepatic steatosis Aortic Atherosclerosis (ICD10-I70.0). Electronically  Signed   By: Helyn Numbers MD   On: 05/27/2020 23:22   DG Chest Port 1 View  Result Date: 05/27/2020 CLINICAL DATA:  Questionable sepsis. EXAM: PORTABLE CHEST 1 VIEW COMPARISON:  Chest x-ray 04/06/2020, CT chest 03/30/2018 FINDINGS: The heart size and mediastinal contours are within normal limits. No focal consolidation. No pulmonary edema. No pleural effusion. No pneumothorax. No acute osseous abnormality. IMPRESSION: No active disease. Electronically Signed   By: Tish Frederickson M.D.   On: 05/27/2020 20:40    EKG: Personally reviewed.  Rhythm is normal sinus rhythm with heart rate of 88 bpm.  Prolonged QT interval of 490 ms.  No dynamic ST segment changes appreciated.  Assessment/Plan Principal Problem:   Colitis due to Clostridioides difficile   Patient presenting with  6-week history of watery diarrhea status post a prolonged course of antibiotic therapy in early October for soft tissue infection  C. difficile PCR testing found to be positive on 11/16  Patient now hospitalized after patient not responding as an outpatient setting to oral Flagyl.  [name] suffering from multiple sequela of severe disease including severe volume depletion, severe electrolyte abnormalities and generalized weakness  Placing patient on oral vancomycin and monitoring closely for symptomatic improvement.  Patient currently n.p.o. with sips of clear liquids due to concurrent diagnosis of DKA however will not advance patient past clear liquid diet until there are signs that the colitis is abating.  Hydrating patient aggressively with intravenous isotonic fluids  Aggressive electrolyte repletion considering severe hypokalemia  Active Problems:   DKA (diabetic ketoacidosis) (HCC)   Patient presenting metabolic acidosis with substantial anion gap of 34 secondary to combination of diabetic ketoacidosis and lactic acidosis  Markedly elevated beta hydroxybutyrate clinches the diagnosis of diabetic  ketoacidosis  Initiating insulin infusion while aggressively replacing potassium  Hydrating patient with intravenous isotonic fluids  Obtaining serial chemistries and beta hydroxybutyrate levels.  We will advance diet once gap is closed    Hypokalemia due to excessive gastrointestinal loss of potassium   Extreme hypokalemia secondary to a 6-week history of severe gastrointestinal losses  Magnesium levels are remarkably normal.  Replacing potassium stores with both oral and intravenous means  Serial chemistries to monitor potassium levels closely    Lactic acidosis  Notable substantial lactic acidosis of 6.3  This is likely secondary to underlying intestinal infection as well as profound volume depletion  Hydrating patient with intravenous isotonic fluids  Treating concurrent C. difficile infection  Performing serial lactic acid levels to ensure downtrending and resolution    Essential hypertension   Holding home regimen of oral antihypertensives for now    History of intravenous drug abuse (HCC)   Patient has a long history of intravenous drug abuse, complicated by infective endocarditis and osteomyelitis in the past  Patient states that he has been abstinent for greater than 1 year  Alcohol abuse   Patient reports alcohol abuse, consuming in excess of one fifth of liquor daily  No obvious evidence of alcohol withdrawal at this time  CIWA protocol initiated  Providing patient with as needed benzodiazepines for evidence of withdrawal    Acute alcoholic pancreatitis   CT of the abdomen and pelvis reports peripancreatic edema trend the pancreatic head suggestive of mild pancreatitis  While this could very well be possible in a patient with ongoing heavy alcohol use, a component of pancreatitis in this patient is likely very mild based on physical examination  Keeping patient either n.p.o. or on a clear liquid diet anyway due to presence of DKA and  colitis  Conservative management otherwise    Diabetic polyneuropathy associated with type 2 diabetes mellitus (HCC)   Continuing home regimen of gabapentin    Open wound of right thigh    Notable 1 cm diameter shallow ulceration of the right thigh with surrounding erythema  [name] extremely unclear on how this wound and inflammation developed.  Patient insist that he is abstinent of intravenous drugs at this time.  Considering patient's history of frequent skin abscess, MSSA and MRSA will obtain ultrasound of the area to ensure there is no underlying abscess formation   Code Status:  Full code Family Communication: Deferred  Status is: Observation  The patient remains OBS appropriate and will d/c before 2 midnights.  Dispo:  The patient is from: Home              Anticipated d/c is to: Home              Anticipated d/c date is: 2 days              Patient currently is not medically stable to d/c.        Marinda Elk MD [facility] Pager [phone]  If 7PM-7AM, please contact night-coverage [email or web address] Use universal [place] password for that web site. If you do not have the password, please call the hospital operator.  05/28/2020, 1:01 AM

## 2020-05-28 NOTE — Progress Notes (Signed)
Patient evaluated multiple times over the day with improving symptoms.  Potassium remains markedly low all morning - unable to re-initiate DKA protocol. Glucose remains elevated. Given most recent potassium of 2.6 will transition off IV insulin to sliding scale with advancement of diet to full liquids. Continue remainder of of PO potassium and an additional IV.  Diarrhea, nausea, and vomiting appear to be well controlled - continue PO vancomycin given positive Cdiff testing.  BMP Latest Ref Rng & Units 05/28/2020 05/28/2020 05/28/2020  Glucose 70 - 99 mg/dL 423(T) 532(Y) 233(I)  BUN 6 - 20 mg/dL 6 <3(H) <6(Y)  Creatinine 0.61 - 1.24 mg/dL 6.16 8.37 2.90  BUN/Creat Ratio 9 - 20 - - -  Sodium 135 - 145 mmol/L 134(L) 137 136  Potassium 3.5 - 5.1 mmol/L 2.6(LL) <2.0(LL) <2.0(LL)  Chloride 98 - 111 mmol/L 92(L) 91(L) 91(L)  CO2 22 - 32 mmol/L 21(L) 29 26  Calcium 8.9 - 10.3 mg/dL 7.3(L) 7.6(L) 7.7(L)

## 2020-05-28 NOTE — ED Notes (Signed)
IV team placed PICC line and is no longer at bedside

## 2020-05-28 NOTE — Progress Notes (Signed)
PROGRESS NOTE    Tony Long  XFG:[id number] DOB: 10-25-1975 DOA: 05/27/2020 PCP: [name], FNP    Brief Narrative:  Tony Long is a 44 year old male with past medical history significant for endocarditis with previous history of IV drug use, essential hypertension, chronic hepatitis C, type 2 diabetes mellitus, peripheral neuropathy who presented to [name], ED with progressive weakness with persistent diarrhea.  Patient was seen by his PCP on 05/15/2020 and diagnosed with C. difficile colitis and started on treatment with Flagyl on 11/22.  Despite treatment, symptoms persisted.  Patient reports watery diarrhea occurring 6-8 times daily associated with generalized abdominal pain, nausea and vomiting.  He reports his appetite has been poor and not taking his insulin due to his poor oral intake.  Recently hospitalized at [facility] October 8-11 for DKA and suspected soft tissue infection of anterior chest wall, treated with course of IV vancomycin followed by Augmentin and doxycycline for several days.  In the ED, temperature 98.5, HR 88, BP 91/58, SPO2 100% on room air.  Sodium 137, potassium less than 2.0, chloride 85, CO2 18, glucose 410, BUN 5, creatinine 1.26, lipase 51, AST 33, ALT 24, total bilirubin 1.2, lactic acid 6.3.  WBC count 7.8, hemoglobin 12.4, platelets 203.  Anion gap 34.  SARS-CoV-2/influenza A/B negative.  Urinalysis with greater than 500 glucose, 80 ketones, rare bacteria 6/10 WBCs.  CT abdomen/pelvis with peripancreatic edema with mild uncomplicated pancreatitis, diffuse bowel wall thickening involving colon and terminal ileum, no bowel infarction, perforation or obstruction.  Chest x-ray with no acute cardiopulmonary disease process.  Blood cultures and urine cultures obtained.  Hospitalist service consulted for admission for further evaluation and management of DKA in the setting of failed outpatient treatment of C. difficile colitis with severe  hypokalemia.   Assessment & Plan:   Principal Problem:   Colitis due to Clostridioides difficile Active Problems:   Essential hypertension   DKA (diabetic ketoacidosis) (HCC)   Hypokalemia due to excessive gastrointestinal loss of potassium   Lactic acidosis   History of intravenous drug abuse (HCC)   Acute alcoholic pancreatitis   Diabetic polyneuropathy associated with type 2 diabetes mellitus (HCC)   Open wound of right thigh   Alcohol abuse   Diabetic ketoacidosis High anion gap metabolic acidosis/lactic acidosis Patient presenting with several week history of progressive watery diarrhea, diagnosed with C. difficile colitis outpatient in which she was prescribed Flagyl for treatment.  Etiology likely poor oral intake with active colitis.  Glucose 410 on admission with elevated anion gap of 34 and beta hydroxybutyrate acid greater than 8.0.  80 ketones on urinalysis.  Home regimen includes Lantus 30 units subcutaneously daily and Humalog 10 units 3 times daily AC. --Given severe hypokalemia, will discontinue insulin drip for now until potassium >3.0 --PICC line ordered given the need for multiple noncompatible infusions and poor IV access --BMP every 4 hours --Clear liquid diet  Severe hypokalemia Potassium less than 2.0 on admission, patient was started on insulin drip with persistent severe hypokalemia.  Discussed with nurse, will stop insulin drip for now until potassium has been repleted greater than 3.0 before restarting insulin drip. --KCl 10 mEq IV x6 --Potassium chloride 80 mEq p.o. twice daily --BMP every 4 hours --Continue monitor on telemetry  C. difficile colitis, present on admission C. difficile positive on 05/15/2020, failed outpatient treatment with Flagyl. --Continue oral vancomycin 125mg  PO QID  EtOH pancreatitis, mild Mildly elevated lipase.  CT abdomen/pelvis with peripancreatic edema. --Continue IV fluid hydration  Thigh lesion/ulcer Soft tissue  ultrasound with no drainable fluid collection. --Continue to monitor lesion  Peripheral neuropathy --Continue home gabapentin 60 mg 3 times daily  EtOH use d/o:  --CIWAA protocol with symptom triggered Ativan --Thiamine, folic acid, multivitamin  Hx IV drug abuse Patient reports abstinence x1 year.  Congratulated and encouraged to maintain complete cessation.   DVT prophylaxis: Lovenox Code Status: Full code Family Communication: No family present at bedside this morning  Disposition Plan:  Status is: Inpatient  Remains inpatient appropriate because:Persistent severe electrolyte disturbances, Ongoing diagnostic testing needed not appropriate for outpatient work up, Unsafe d/c plan, IV treatments appropriate due to intensity of illness or inability to take PO and Inpatient level of care appropriate due to severity of illness   Dispo: The patient is from: Home              Anticipated d/c is to: Home              Anticipated d/c date is: 3 days              Patient currently is not medically stable to d/c.   Consultants:   None  Procedures:   PICC line ordered  Antimicrobials:   Oral vancomycin   Subjective: Patient seen and examined bedside, continues in ED holding area.  Continues with some mild abdominal pain and persistent diarrhea.  Mild nausea but no further vomiting.  Requesting something to eat this morning.  D[facility]ed with patient [facility]  Discussed with patient need to discontinue insulin drip until electrolytes have been repleted and then will resume.  Nursing present at bedside.  No other complaints or concerns at this time.  Denies headache, no fever/chills/night sweats, vomiting, no chest pain, no palpitations, no shortness of breath.  No acute concerns currently per nursing staff.  Objective: Vitals:   05/28/20 0615 05/28/20 0719 05/28/20 0730 05/28/20 0745  BP: 122/74 123/67 (!) 141/90  129/73  Pulse: 95 99 96 98  Resp: 16 (!) 25 20 (!) 8  Temp:      TempSrc:      SpO2: 96% 100% 96% 97%  Weight:      Height:        Intake/Output Summary (Last 24 hours) at 05/28/2020 0800 Last data filed at 05/28/2020 0100 Gross per 24 hour  Intake --  Output 500 ml  Net -500 ml   Filed Weights   05/27/20 2038  Weight: 68 kg    Examination:  General exam: Appears calm and comfortable, thin in appearance Respiratory system: Clear to auscultation. Respiratory effort normal.  Oxygenating well on room air Cardiovascular system: S1 & S2 heard, RRR. No JVD, murmurs, rubs, gallops or clicks. No pedal edema. Gastrointestinal system: Abdomen is nondistended, soft, mild generalized tenderness. No organomegaly or masses felt. Normal bowel sounds heard. Central nervous system: Alert and oriented. No focal neurological deficits. Extremities: Symmetric 5 x 5 power. Skin: Right anterior thigh ulceration, 1 cm Psychiatry: Judgement and insight appear normal. Mood & affect appropriate.     Data Reviewed: I have personally reviewed following labs and imaging studies  CBC: Recent Labs  Lab 05/27/20 2019 05/27/20 2051 05/28/20 0539  WBC 7.8  --  8.8  NEUTROABS 6.2  --  7.5  HGB 12.4* 13.9 10.2*  HCT 39.2 41.0 30.6*  MCV 106.2*  --  103.4*  PLT 203  --  149*   Basic Metabolic Panel:  Recent Labs  Lab 05/27/20 2019 05/27/20 2051 05/27/20 2209 05/28/20 0539  NA 137 138  --  136  K <2.0* <2.0*  --  <2.0*  CL 85*  --   --  91*  CO2 18*  --   --  26  GLUCOSE 410*  --   --  266*  BUN 5*  --   --  <5*  CREATININE 1.26*  --   --  1.19  CALCIUM 8.3*  --   --  7.7*  MG  --   --  2.1  --    GFR: Estimated Creatinine Clearance: 77 mL/min (by C-G formula based on SCr of 1.19 mg/dL). Liver Function Tests: Recent Labs  Lab 05/27/20 2019  AST 33  ALT 24  ALKPHOS 308*  BILITOT 1.2  PROT 6.2*  ALBUMIN 2.5*   Recent Labs  Lab 05/27/20 2019  LIPASE 51   No results for  input(s): AMMONIA in the last 168 hours. Coagulation Profile: Recent Labs  Lab 05/27/20 2019  INR 1.1   Cardiac Enzymes: No results for input(s): CKTOTAL, CKMB, CKMBINDEX, TROPONINI in the last 168 hours. BNP (last 3 results) No results for input(s): PROBNP in the last 8760 hours. HbA1C: Recent Labs    05/28/20 0539  HGBA1C 8.8*   CBG: Recent Labs  Lab 05/28/20 0143 05/28/20 0258 05/28/20 0409 05/28/20 0516 05/28/20 0625  GLUCAP 451* 363* 333* 301* 218*   Lipid Profile: No results for input(s): CHOL, HDL, LDLCALC, TRIG, CHOLHDL, LDLDIRECT in the last 72 hours. Thyroid Function Tests: No results for input(s): TSH, T4TOTAL, FREET4, T3FREE, THYROIDAB in the last 72 hours. Anemia Panel: No results for input(s): VITAMINB12, FOLATE, FERRITIN, TIBC, IRON, RETICCTPCT in the last 72 hours. Sepsis Labs: Recent Labs  Lab 05/27/20 2019 05/28/20 0539  LATICACIDVEN 6.3* 2.1*    Recent Results (from the past 240 hour(s))  Resp Panel by RT-PCR (Flu A&B, Covid) Nasopharyngeal Swab     Status: None   Collection Time: 05/27/20  8:19 PM   Specimen: Nasopharyngeal Swab; Nasopharyngeal(NP) swabs in vial transport medium  Result Value Ref Range Status   SARS Coronavirus 2 by RT PCR NEGATIVE NEGATIVE Final    Comment: (NOTE) SARS-CoV-2 target nucleic acids are NOT DETECTED.  The SARS-CoV-2 RNA is generally detectable in upper respiratory specimens during the acute phase of infection. The lowest concentration of SARS-CoV-2 viral copies this assay can detect is 138 copies/mL. A negative result does not preclude SARS-Cov-2 infection and should not be used as the sole basis for treatment or other patient management decisions. A negative result may occur with  improper specimen collection/handling, submission of specimen other than nasopharyngeal swab, presence of viral mutation(s) within the areas targeted by this assay, and inadequate number of viral copies(<138 copies/mL). A negative  result must be combined with clinical observations, patient history, and epidemiological information. The expected result is Negative.  Fact Sheet for Patients:  BloggerCourse.com  Fact Sheet for Healthcare Providers:  SeriousBroker.it  This test is no t yet approved or cleared by the [place] FDA and  has been authorized for detection and/or diagnosis of SARS-CoV-2 by FDA under an Emergency Use Authorization (EUA). This EUA will remain  in effect (meaning this test can be used) for the duration of the COVID-19 declaration under Section 564(b)(1) of the Act, 21 U.S.C.section 360bbb-3(b)(1), unless the authorization is terminated  or revoked sooner.       Influenza A by PCR NEGATIVE NEGATIVE Final   Influenza B  by PCR NEGATIVE NEGATIVE Final    Comment: (NOTE) The Xpert Xpress SARS-CoV-2/FLU/RSV plus assay is intended as an aid in the diagnosis of influenza from Nasopharyngeal swab specimens and should not be used as a sole basis for treatment. Nasal washings and aspirates are unacceptable for Xpert Xpress SARS-CoV-2/FLU/RSV testing.  Fact Sheet for Patients: BloggerCourse.com  Fact Sheet for Healthcare Providers: SeriousBroker.it  This test is not yet approved or cleared by the [place] FDA and has been authorized for detection and/or diagnosis of SARS-CoV-2 by FDA under an Emergency Use Authorization (EUA). This EUA will remain in effect (meaning this test can be used) for the duration of the COVID-19 declaration under Section 564(b)(1) of the Act, 21 U.S.C. section 360bbb-3(b)(1), unless the authorization is terminated or revoked.  Performed at [facility], [address] [place], [place] [address]          Radiology Studies: CT ABDOMEN PELVIS W CONTRAST  Result Date: 05/27/2020 CLINICAL DATA:  Diffuse abdominal pain, diarrhea, C diff colitis  EXAM: CT ABDOMEN AND PELVIS WITH CONTRAST TECHNIQUE: Multidetector CT imaging of the abdomen and pelvis was performed using the standard protocol following bolus administration of intravenous contrast. CONTRAST:  OMNIPAQUE IOHEXOL 300 MG/ML  SOLN COMPARISON:  None. FINDINGS: Lower chest: Mild left basilar scarring. The visualized lung bases are otherwise clear. The visualized heart and pericardium are unremarkable Hepatobiliary: Severe hepatic steatosis. No enhancing liver lesions. Liver size within normal limits. No intra or extrahepatic biliary ductal dilation. Gallbladder unremarkable. Pancreas: There is mild peripancreatic edema identified surrounding the head of the pancreas, particularly within the pancreatico duodenal groove, suggesting changes of a mild pancreatitis. Normal enhancement of the pancreatic parenchyma. No pancreatic ductal dilation. No parenchymal calcifications. No loculated peripancreatic fluid collections are identified. Spleen: Unremarkable Adrenals/Urinary Tract: Adrenal glands are unremarkable. Kidneys are normal, without renal calculi, focal lesion, or hydronephrosis. Bladder is unremarkable. Stomach/Bowel: The large bowel demonstrates diffuse, circumferential wall thickening in keeping with the given history of C diff colitis. No significant colonic dilation. No differential enhancement. No evidence of obstruction or perforation. There is circumferential wall thickening involving the terminal ileum just proximal to the ileocecal junction likely representing backwash ileitis. The stomach and small bowel are otherwise unremarkable. No free intraperitoneal gas or fluid. Vascular/Lymphatic: Mild aortoiliac atherosclerotic calcification. No aortic aneurysm. The abdominal vasculature is otherwise unremarkable. No pathologic adenopathy. Reproductive: Prostate is unremarkable. Other: Circumferential wall thickening of the rectum is in keeping with the given history of infectious  proctocolitis. Musculoskeletal: No acute bone abnormality. Bilateral L5 pars defects are identified with minimal anterolisthesis of L5 upon S1. T12 butterfly vertebra noted. No lytic or blastic bone lesion. IMPRESSION: Peripancreatic edema surrounding the pancreatic head and within the pancreatico duodenal groove in keeping with changes of mild uncomplicated pancreatitis. Diffuse bowel wall thickening involving the colon and terminal ileum in keeping with changes of infectious proctocolitis and probable backwash ileitis. No evidence of a bowel infarction, perforation, or obstruction. Severe hepatic steatosis Aortic Atherosclerosis (ICD10-I70.0). Electronically Signed   By: Helyn Numbers MD   On: 05/27/2020 23:22   DG Chest Port 1 View  Result Date: 05/27/2020 CLINICAL DATA:  Questionable sepsis. EXAM: PORTABLE CHEST 1 VIEW COMPARISON:  Chest x-ray 04/06/2020, CT chest 03/30/2018 FINDINGS: The heart size and mediastinal contours are within normal limits. No focal consolidation. No pulmonary edema. No pleural effusion. No pneumothorax. No acute osseous abnormality. IMPRESSION: No active disease. Electronically Signed   By: Normajean Glasgow.D.  On: 05/27/2020 20:40   [place] RT LOWER EXTREM LTD SOFT TISSUE NON VASCULAR  Result Date: 05/28/2020 CLINICAL DATA:  IV drug use, right thigh cellulitis EXAM: ULTRASOUND RIGHT LOWER EXTREMITY LIMITED TECHNIQUE: Ultrasound examination of the lower extremity soft tissues was performed in the area of clinical concern. COMPARISON:  None. FINDINGS: Joint Space: Not applicable Muscles: Not well assessed on this examination Tendons: Not applicable Other Soft Tissue Structures: Limited grayscale and color Doppler sonography in the area of reported erythema demonstrates several shotty subcutaneous lymph nodes, however, no frankly pathologic adenopathy identified. No abnormal subcutaneous soft tissue masses, fluid collections, or calcifications are identified on the presented  images. IMPRESSION: No discrete drainable fluid collection within the area of reported erythema. Electronically Signed   By: Helyn Numbers MD   On: 05/28/2020 02:54        Scheduled Meds: . enoxaparin (LOVENOX) injection  40 mg Subcutaneous Q24H  . folic acid  1 mg Oral Daily  . gabapentin  600 mg Oral TID  . multivitamin with minerals  1 tablet Oral Daily  . potassium chloride  80 mEq Oral BID  . thiamine  100 mg Oral Daily   Or  . thiamine  100 mg Intravenous Daily  . vancomycin  125 mg Oral QID   Continuous Infusions: . sodium chloride Stopped (05/28/20 0627)  . dextrose 5 % and 0.9% NaCl Stopped (05/28/20 5366)  . insulin Stopped (05/28/20 0715)  . potassium chloride 10 mEq (05/28/20 0746)     LOS: 0 days    Time spent: 39 minutes spent on chart review, discussion with nursing staff, consultants, updating family and interview/physical exam; more than 50% of that time was spent in counseling and/or coordination of care.    Alvira Philips [place], DO [facility] Available via Epic secure chat 7am-7pm After these hours, please refer to coverage provider listed on [email or web address] 05/28/2020, 8:00 AM

## 2020-05-28 NOTE — Progress Notes (Signed)
Inpatient Diabetes Program Recommendations  AACE/ADA: New Consensus Statement on Inpatient Glycemic Control (2015)  Target Ranges:  Prepandial:   less than 140 mg/dL      Peak postprandial:   less than 180 mg/dL (1-2 hours)      Critically ill patients:  140 - 180 mg/dL   Results for GRAINGER, MCCARLEY (MRN [id number]) as of 05/28/2020 07:02  Ref. Range 05/27/2020 20:19  Sodium Latest Ref Range: 135 - 145 mmol/L 137  Potassium Latest Ref Range: 3.5 - 5.1 mmol/L <2.0 (LL)  Chloride Latest Ref Range: 98 - 111 mmol/L 85 (L)  CO2 Latest Ref Range: 22 - 32 mmol/L 18 (L)  Glucose Latest Ref Range: 70 - 99 mg/dL 093 (H)  BUN Latest Ref Range: 6 - 20 mg/dL 5 (L)  Creatinine Latest Ref Range: 0.61 - 1.24 mg/dL 8.18 (H)  Calcium Latest Ref Range: 8.9 - 10.3 mg/dL 8.3 (L)  Anion gap Latest Ref Range: 5 - 15  34 (H)   Results for JT, BRABEC (MRN [id number]) as of 05/28/2020 07:02  Ref. Range 05/27/2020 20:19  Beta-Hydroxybutyric Acid Latest Ref Range: 0.05 - 0.27 mmol/L >8.00 (H)   Results for ELHADJI, PECORE (MRN [id number]) as of 05/28/2020 07:02  Ref. Range 05/27/2020 21:43 05/28/2020 01:43 05/28/2020 02:58 05/28/2020 04:09 05/28/2020 05:16 05/28/2020 06:25  Glucose-Capillary Latest Ref Range: 70 - 99 mg/dL 510 (H) 258 (H)  IV Insulin Drip started 363 (H) 333 (H)  IV Insulin Drip 301 (H)  IV Insulin Drip 218 (H)  IV Insulin Drip   Results for TIA, GELB (MRN [id number]) as of 05/28/2020 07:02  Ref. Range 03/28/2019 11:36 08/08/2019 13:27 02/29/2020 09:19 02/29/2020 09:19 05/28/2020 05:39  Hemoglobin A1C Latest Ref Range: 4.8 - 5.6 % 11.5 (A) 10.2 (A) 9.1 9.1 (A) 8.8 (H)  (205 mg/dl)   Admit with: DKA/ Colitis due to Clostridioides difficile/ Hypokalemia  History: DM, IV drug use (abstinent x 1 year), infective endocarditis (03/2018), hypertension, chronic hepatitis C, ETOH Abuse Recently hospitalized 10/08-10/11 at [facility] for DKA and Chest wall abscess  Home DM Meds: Lantus  30 units QHS       Humalog 10 units TID per SSI       Jardiance 10 mg Daily  Current Orders: IV Insulin Drip    MD- Note K+ level extremely low this AM.  Please consider temporary pause of the IV Insulin Drip until K+ can be replaced, then resume IV Insulin Drip    --Will follow patient during hospitalization--  Ambrose Finland RN, MSN, CDE Diabetes Coordinator Inpatient Glycemic Control Team Team Pager: [phone] (8a-5p)

## 2020-05-28 NOTE — ED Notes (Signed)
States is fine   No request at present  Is on phone

## 2020-05-28 NOTE — Progress Notes (Signed)
Checked back on pt this afternoon.  I saw his CBG was 401 at 2:25pm.  IV Insulin has been paused since this AM to allow for K+ replacement.  Secure Chatted with Santiago Glad, RN at [facility] ICU.  Asked RN if plan was to restart IV or SQ Insulin.  Per RN, current BMET pending and then will decide what to do about insulin.      --Will follow patient during hospitalization--  Ambrose Finland RN, MSN, CDE Diabetes Coordinator Inpatient Glycemic Control Team Team Pager: [phone] (8a-5p)

## 2020-05-29 DIAGNOSIS — E43 Unspecified severe protein-calorie malnutrition: Secondary | ICD-10-CM | POA: Insufficient documentation

## 2020-05-29 LAB — BASIC METABOLIC PANEL
Anion gap: 7 (ref 5–15)
Anion gap: 6 (ref 5–15)
BUN: 6 mg/dL (ref 6–20)
BUN: 6 mg/dL (ref 6–20)
CO2: 29 mmol/L (ref 22–32)
CO2: 28 mmol/L (ref 22–32)
Calcium: 7.5 mg/dL — ABNORMAL LOW (ref 8.9–10.3)
Calcium: 7.5 mg/dL — ABNORMAL LOW (ref 8.9–10.3)
Chloride: 102 mmol/L (ref 98–111)
Chloride: 101 mmol/L (ref 98–111)
Creatinine, Ser: 0.67 mg/dL (ref 0.61–1.24)
Creatinine, Ser: 0.7 mg/dL (ref 0.61–1.24)
GFR, Estimated: >60 mL/min (ref >60)
GFR, Estimated: >60 mL/min (ref >60)
Glucose, Bld: 238 mg/dL — ABNORMAL HIGH (ref 70–99)
Glucose, Bld: 282 mg/dL — ABNORMAL HIGH (ref 70–99)
Potassium: 3.6 mmol/L (ref 3.5–5.1)
Potassium: 4.3 mmol/L (ref 3.5–5.1)
Sodium: 138 mmol/L (ref 135–145)
Sodium: 135 mmol/L (ref 135–145)

## 2020-05-29 LAB — URINE CULTURE: Culture: NO GROWTH

## 2020-05-29 LAB — HEPATIC FUNCTION PANEL
ALT: 17 U/L (ref 0–44)
AST: 26 U/L (ref 15–41)
Albumin: 2.1 g/dL — ABNORMAL LOW (ref 3.5–5.0)
Alkaline Phosphatase: 193 U/L — ABNORMAL HIGH (ref 38–126)
Bilirubin, Direct: 0.2 mg/dL (ref 0.0–0.2)
Indirect Bilirubin: 1 mg/dL — ABNORMAL HIGH (ref 0.3–0.9)
Total Bilirubin: 1.2 mg/dL (ref 0.3–1.2)
Total Protein: 4.9 g/dL — ABNORMAL LOW (ref 6.5–8.1)

## 2020-05-29 LAB — BETA-HYDROXYBUTYRIC ACID
Beta-Hydroxybutyric Acid: 2.54 mmol/L — ABNORMAL HIGH (ref 0.05–0.27)
Beta-Hydroxybutyric Acid: 1.28 mmol/L — ABNORMAL HIGH (ref 0.05–0.27)

## 2020-05-29 LAB — GLUCOSE, CAPILLARY
Glucose-Capillary: 308 mg/dL — ABNORMAL HIGH (ref 70–99)
Glucose-Capillary: 279 mg/dL — ABNORMAL HIGH (ref 70–99)
Glucose-Capillary: 216 mg/dL — ABNORMAL HIGH (ref 70–99)
Glucose-Capillary: 224 mg/dL — ABNORMAL HIGH (ref 70–99)

## 2020-05-29 LAB — MAGNESIUM: Magnesium: 1.9 mg/dL (ref 1.7–2.4)

## 2020-05-29 LAB — PHOSPHORUS: Phosphorus: <1 mg/dL — CL (ref 2.5–4.6)

## 2020-05-29 MED ORDER — POTASSIUM CHLORIDE CRYS ER 20 MEQ PO TBCR
40.0000 meq | EXTENDED_RELEASE_TABLET | Freq: Two times a day (BID) | ORAL | Status: DC
  Administered 2020-05-29 – 2020-05-30 (×3): 40 meq via ORAL
  Filled 2020-05-29 (×3): qty 2

## 2020-05-29 MED ORDER — POTASSIUM PHOSPHATES 15 MMOLE/5ML IV SOLN
30.0000 mmol | Freq: Once | INTRAVENOUS | Status: AC
  Administered 2020-05-29: 30 mmol via INTRAVENOUS
  Filled 2020-05-29: qty 10

## 2020-05-29 MED ORDER — INSULIN GLARGINE 100 UNIT/ML ~~LOC~~ SOLN
20.0000 [IU] | Freq: Every day | SUBCUTANEOUS | Status: DC
  Administered 2020-05-29: 20 [IU] via SUBCUTANEOUS
  Filled 2020-05-29: qty 0.2

## 2020-05-29 MED ORDER — GERHARDT'S BUTT CREAM
TOPICAL_CREAM | Freq: Every day | CUTANEOUS | Status: DC
  Filled 2020-05-29 (×3): qty 1

## 2020-05-29 MED ORDER — PROSOURCE PLUS PO LIQD: 30.0000 mL | Freq: Every day | ORAL | Status: DC

## 2020-05-29 MED ORDER — LIP MEDEX EX OINT
TOPICAL_OINTMENT | CUTANEOUS | Status: DC | PRN
  Filled 2020-05-29: qty 7

## 2020-05-29 MED ORDER — LIP MEDEX EX OINT: TOPICAL_OINTMENT | CUTANEOUS | Status: DC | PRN

## 2020-05-29 NOTE — Progress Notes (Signed)
CRITICAL VALUE ALERT  Critical Value:  Phosphorus <1.0  Date & Time Notied:  7:29 AM 05/29/2020  Provider Notified: Natale Milch, MD  Orders Received/Actions taken: New orders received. See MAR.

## 2020-05-29 NOTE — Progress Notes (Signed)
Initial Nutrition Assessment  DOCUMENTATION CODES:   Severe malnutrition in context of chronic illness  INTERVENTION:  - will d/c Ensure Enlive. - will order 30 ml Prosource Plus once/day, each supplement provides 100 kcal and 15 grams protein.  - recommend outpatient referral for DM diet education, if determined to be appropriate.  - re-weigh patient today.   NUTRITION DIAGNOSIS:   Severe Malnutrition related to chronic illness (uncontrolled type 2 DM) as evidenced by moderate fat depletion, moderate muscle depletion, severe muscle depletion.  GOAL:   Patient will meet greater than or equal to 90% of their needs  MONITOR:   PO intake, Supplement acceptance, Labs, Weight trends  REASON FOR ASSESSMENT:   Malnutrition Screening Tool  ASSESSMENT:   44 year old male with medical history of IVDA (abstinent x1 year), infective endocarditis (03/2018), HTN, chronic hepatitis C, type 2 DM, and peripheral neuropathy. He presented to the ED with weakness and persistent diarrhea. He was hospitalized at [facility] 11/8-11/11 with DKA and suspected soft tissue infection with chest wall abscesses. He was on IV abx during hospitalization and discharged on oral abx. Stool study done outpatient and he was found to be positive for cdiff on 11/16. In the ED, he was extremely volume depleted.  Diet advanced from NPO to CLD yesterday at 0753 and then to [place] at 1710. No intakes documented yesterday. Diet then advanced to Carb Modified today at 0933.  Able to talk with RN before and after visit to patient's room.  Patient sitting in the chair with mom at bedside. He reports that his appetite is now very good and that he ate 100% of FLD tray and then ordered an omelet once diet advanced and was able to eat this without any discomfort or nausea.  At home, patient will often check his blood sugar while cooking a large breakfast. Any other CBG checks that may occur during the course of a day are not consistent  and may not happen at all.   Patient discusses hospitalization at [name] last month and reports that after d/c CBGs were in the 130-150 mg/dl range but that they slowly started creeping up.  Discussed current diet order and rationale, discussed the role of carbs and the need to spread them out throughout the day, discussed portion sizes, foods that should be limited vs those that can be consumed without concern for CBGs.  Patient very appreciative of information but is transparent in stating he knows he need to make diet modifications, but for now prefers to eat what and how much he would like without regard for portion sizes, etc.  Mom has many questions related to foods and blood sugar. Answered these questions. She also asks if patient will need to be seen by GI outpatient after d/c. Encouraged her and/or patient to discuss this with Hospitalist as this is outside of RD's scope.   Patient reports UBW was ~220 lb but that he has not weighed this for several years. Weight on 11/28 was 150 lb, which appears to be a stated weight, and weight has been stable since 04/27/18.    Labs reviewed; CBG: 308 mg/dl, Ca: 7.5 mg/dl, Phos: <1 mg/dl, Alk Phos elevated. K and Mg WDL. Medications reviewed; 1 mg folvite/day, sliding scale novolog, 15 units novolog x1 dose 11/29, 1 tablet multivitamin with minerals/day, 10 mEq IV KCl x12 runs 11/29, 40 mEq Klor-Con BID starting 11/30, 30 mmol IV KPhos x1 run 11/30, 100 mg oral thiamine/day.  IVF; NS-20 mEq KCl @ 150 ml/hr.  NUTRITION - FOCUSED PHYSICAL EXAM:    Most Recent Value  Orbital Region Mild depletion  Upper Arm Region Moderate depletion  Thoracic and Lumbar Region Moderate depletion  Buccal Region Moderate depletion  Temple Region Moderate depletion  Clavicle Bone Region Severe depletion  Clavicle and Acromion Bone Region Severe depletion  Scapular Bone Region Moderate depletion  Dorsal Hand Moderate depletion  Patellar Region Severe depletion   Anterior Thigh Region Severe depletion  Posterior Calf Region Severe depletion  Edema (RD Assessment) None  Hair Reviewed  Eyes Reviewed  Mouth Reviewed  Skin Reviewed  Nails Reviewed       Diet Order:   Diet Order            Diet Carb Modified Fluid consistency: Thin; Room service appropriate? Yes  Diet effective now                 EDUCATION NEEDS:   Education needs have been addressed  Skin:  Skin Assessment: Skin Integrity Issues: Skin Integrity Issues:: Diabetic Ulcer, Other (Comment) Diabetic Ulcer: location not specified Other: MASD to rectum/buttocks  Last BM:  11/30  Height:   Ht Readings from Last 1 Encounters:  05/27/20 '5\' 11"'  (1.803 m)    Weight:   Wt Readings from Last 1 Encounters:  05/27/20 68 kg     Estimated Nutritional Needs:  Kcal:  9381-8299 kcal Protein:  100-115 grams Fluid:  >/= 2.5 L/day     Jarome Matin, MS, RD, LDN, CNSC Inpatient Clinical Dietitian RD pager # available in [place]  After hours/weekend pager # available in [facility]

## 2020-05-29 NOTE — Progress Notes (Signed)
PROGRESS NOTE    Tony Long  BPZ:[id number] DOB: May 15, 1976 DOA: 05/27/2020 PCP: [name], FNP    Brief Narrative:  Tony Long is a 44 year old male with past medical history significant for endocarditis with previous history of IV drug use, essential hypertension, chronic hepatitis C, type 2 diabetes mellitus, peripheral neuropathy who presented to [name], ED with progressive weakness with persistent diarrhea.  Patient was seen by his PCP on 05/15/2020 and diagnosed with C. difficile colitis and started on treatment with Flagyl on 11/22.  Despite treatment, symptoms persisted.  Patient reports watery diarrhea occurring 6-8 times daily associated with generalized abdominal pain, nausea and vomiting.  He reports his appetite has been poor and not taking his insulin due to his poor oral intake. Recently hospitalized at [name] October 8-11 for DKA and suspected soft tissue infection of anterior chest wall, treated with course of IV vancomycin followed by Augmentin and doxycycline for several days. In the ED, temperature 98.5, HR 88, BP 91/58, SPO2 100% on room air.  Sodium 137, potassium less than 2.0, chloride 85, CO2 18, glucose 410, BUN 5, creatinine 1.26, lipase 51, AST 33, ALT 24, total bilirubin 1.2, lactic acid 6.3.  WBC count 7.8, hemoglobin 12.4, platelets 203.  Anion gap 34.  SARS-CoV-2/influenza A/B negative.  Urinalysis with greater than 500 glucose, 80 ketones, rare bacteria 6/10 WBCs.  CT abdomen/pelvis with peripancreatic edema with mild uncomplicated pancreatitis, diffuse bowel wall thickening involving colon and terminal ileum, no bowel infarction, perforation or obstruction.  Chest x-ray with no acute cardiopulmonary disease process.  Blood cultures and urine cultures obtained.  Hospitalist service consulted for admission for further evaluation and management of DKA in the setting of failed outpatient treatment of C. difficile colitis with severe  hypokalemia.   Assessment & Plan:   Principal Problem:   Colitis due to Clostridioides difficile Active Problems:   Essential hypertension   DKA (diabetic ketoacidosis) (HCC)   Hypokalemia due to excessive gastrointestinal loss of potassium   Lactic acidosis   History of intravenous drug abuse (HCC)   Acute alcoholic pancreatitis   Diabetic polyneuropathy associated with type 2 diabetes mellitus (HCC)   Open wound of right thigh   Alcohol abuse   Diabetic ketoacidosis, resolving High anion gap metabolic acidosis/lactic acidosis Uncontrolled insulin-dependent type 2 diabetes Home regimen includes Lantus 30 units subcutaneously daily and Humalog 10 units 3 times daily AC. Able to advance patient's diet today given anion gap closure and resolution of DKA Resume patient's home long-acting insulin while diet is advanced -appreciate diabetes coordinators insight  Severe hypokalemia, resolving Potassium less than 2.0 on admission DKA  Protocol held and aggressive repletion over the past 24h Potassium now 3.6, no further episodes of diarrhea, off insulin drip, will discontinue telemetry  C. difficile colitis, present on admission Patient presenting with several week history of progressive watery diarrhea, diagnosed with C. difficile colitis outpatient in which she was prescribed Flagyl for treatment. C. difficile positive on 05/15/2020, failed outpatient treatment with Flagyl. Continue oral vancomycin 125mg  PO QID  Profound hypophosphatemia  Likely secondary to above profound diarrhea and poor p.o. intake K-Phos administered x1 today  EtOH pancreatitis, mild Minimally elevated lipase at intake, CT was remarkable for peripancreatic edema Clinically patient does not appear to have pancreatitis, after resolution of diarrhea and DKA patient has no lingering abdominal complaints or pain  Thigh lesion/ulcer Soft tissue ultrasound with no drainable fluid collection. --Continue to  monitor lesion  Peripheral neuropathy --Continue home gabapentin 60 mg  3 times daily  EtOH use d/o:  --CIWAA protocol with symptom triggered Ativan --Thiamine, folic acid, multivitamin  Hx IV drug abuse Patient reports abstinence x1 year.  Congratulated and encouraged to maintain complete cessation.   DVT prophylaxis: Lovenox Code Status: Full code Family Communication: None present  Disposition Plan:  Status is: Inpatient  Remains inpatient appropriate because:Persistent severe electrolyte disturbances, Ongoing diagnostic testing needed not appropriate for outpatient work up, Unsafe d/c plan, IV treatments appropriate due to intensity of illness or inability to take PO and Inpatient level of care appropriate due to severity of illness   Dispo: The patient is from: Home              Anticipated d/c is to: Home              Anticipated d/c date is: 24-48h              Patient currently is not medically stable to d/c.  Given poor p.o. intake and profound lab abnormalities, consider discharge home in the next 24 to 48 hours if patient's labs remained stable, clinically improving and able to tolerate p.o. appropriately to offset any GI losses.   Consultants:   None  Procedures:   PICC line  Antimicrobials:   Oral vancomycin   Subjective: No acute issues or events overnight, patient denies nausea, vomiting, diarrhea, constipation, headache, fevers, chills.  Patient's only complaint this morning was about his diet, we will advance patient to diabetic diet given his rapid resolution of DKA and abdominal symptoms.  Objective: Vitals:   05/29/20 0309 05/29/20 0400 05/29/20 0500 05/29/20 0600  BP:  (!) 93/59 101/66 134/89  Pulse:  76 86 89  Resp:  12 19 10   Temp: 98 F (36.7 C)     TempSrc: Oral     SpO2:  97% 96% 100%  Weight:      Height:        Intake/Output Summary (Last 24 hours) at 05/29/2020 0717 Last data filed at 05/29/2020 0600 Gross per 24 hour  Intake  1767.06 ml  Output 2325 ml  Net -557.94 ml   Filed Weights   05/27/20 2038  Weight: 68 kg    Examination:  General:  Pleasantly resting in bed, No acute distress. HEENT:  Normocephalic atraumatic.  Sclerae nonicteric, noninjected.  Extraocular movements intact bilaterally. Neck:  Without mass or deformity.  Trachea is midline. Lungs:  Clear to auscultate bilaterally without rhonchi, wheeze, or rales. Heart:  Regular rate and rhythm.  Without murmurs, rubs, or gallops. Abdomen:  Soft, nontender, nondistended.  Without guarding or rebound. Extremities: Without cyanosis, clubbing, edema, or obvious deformity. Vascular:  Dorsalis pedis and posterior tibial pulses palpable bilaterally.   Data Reviewed: I have personally reviewed following labs and imaging studies  CBC: Recent Labs  Lab 05/27/20 2019 05/27/20 2051 05/28/20 0539  WBC 7.8  --  8.8  NEUTROABS 6.2  --  7.5  HGB 12.4* 13.9 10.2*  HCT 39.2 41.0 30.6*  MCV 106.2*  --  103.4*  PLT 203  --  149*   Basic Metabolic Panel: Recent Labs  Lab 05/27/20 2019 05/27/20 2019 05/27/20 2051 05/27/20 2209 05/28/20 0539 05/28/20 0943 05/28/20 1447 05/29/20 0200  NA 137   < > 138  --  136 137 134* 138  K <2.0*   < > <2.0*  --  <2.0* <2.0* 2.6* 3.6  CL 85*  --   --   --  91* 91* 92* 102  CO2  18*  --   --   --  26 29 21* 29  GLUCOSE 410*  --   --   --  266* 203* 388* 238*  BUN 5*  --   --   --  <5* <5* 6 6  CREATININE 1.26*  --   --   --  1.19 0.98 0.79 0.67  CALCIUM 8.3*  --   --   --  7.7* 7.6* 7.3* 7.5*  MG  --   --   --  2.1  --   --   --  1.9   < > = values in this interval not displayed.   GFR: Estimated Creatinine Clearance: 114.5 mL/min (by C-G formula based on SCr of 0.67 mg/dL). Liver Function Tests: Recent Labs  Lab 05/27/20 2019 05/29/20 0200  AST 33 26  ALT 24 17  ALKPHOS 308* 193*  BILITOT 1.2 1.2  PROT 6.2* 4.9*  ALBUMIN 2.5* 2.1*   Recent Labs  Lab 05/27/20 2019  LIPASE 51   No results for  input(s): AMMONIA in the last 168 hours. Coagulation Profile: Recent Labs  Lab 05/27/20 2019  INR 1.1   Cardiac Enzymes: No results for input(s): CKTOTAL, CKMB, CKMBINDEX, TROPONINI in the last 168 hours. BNP (last 3 results) No results for input(s): PROBNP in the last 8760 hours. HbA1C: Recent Labs    05/28/20 0539  HGBA1C 8.8*   CBG: Recent Labs  Lab 05/28/20 0625 05/28/20 1425 05/28/20 1616 05/28/20 1749 05/28/20 2134  GLUCAP 218* 401* 422* 428* 312*   Lipid Profile: No results for input(s): CHOL, HDL, LDLCALC, TRIG, CHOLHDL, LDLDIRECT in the last 72 hours. Thyroid Function Tests: No results for input(s): TSH, T4TOTAL, FREET4, T3FREE, THYROIDAB in the last 72 hours. Anemia Panel: No results for input(s): VITAMINB12, FOLATE, FERRITIN, TIBC, IRON, RETICCTPCT in the last 72 hours. Sepsis Labs: Recent Labs  Lab 05/27/20 2019 05/28/20 0539  LATICACIDVEN 6.3* 2.1*    Recent Results (from the past 240 hour(s))  Blood Culture (routine x 2)     Status: None (Preliminary result)   Collection Time: 05/27/20  8:19 PM   Specimen: BLOOD  Result Value Ref Range Status   Specimen Description BLOOD RIGHT ANTECUBITAL  Final   Special Requests   Final    BOTTLES DRAWN AEROBIC AND ANAEROBIC Blood Culture results may not be optimal due to an inadequate volume of blood received in culture bottles   Culture   Final    NO GROWTH < 24 HOURS Performed at [facility], [address] [place], [place] [address]    Report Status PENDING  Incomplete  Resp Panel by RT-PCR (Flu A&B, Covid) Nasopharyngeal Swab     Status: None   Collection Time: 05/27/20  8:19 PM   Specimen: Nasopharyngeal Swab; Nasopharyngeal(NP) swabs in vial transport medium  Result Value Ref Range Status   SARS Coronavirus 2 by RT PCR NEGATIVE NEGATIVE Final    Comment: (NOTE) SARS-CoV-2 target nucleic acids are NOT DETECTED.  The SARS-CoV-2 RNA is generally detectable in upper respiratory specimens during  the acute phase of infection. The lowest concentration of SARS-CoV-2 viral copies this assay can detect is 138 copies/mL. A negative result does not preclude SARS-Cov-2 infection and should not be used as the sole basis for treatment or other patient management decisions. A negative result may occur with  improper specimen collection/handling, submission of specimen other than nasopharyngeal swab, presence of viral mutation(s) within the areas targeted by this assay, and inadequate  number of viral copies(<138 copies/mL). A negative result must be combined with clinical observations, patient history, and epidemiological information. The expected result is Negative.  Fact Sheet for Patients:  BloggerCourse.com  Fact Sheet for Healthcare Providers:  SeriousBroker.it  This test is no t yet approved or cleared by the [place] FDA and  has been authorized for detection and/or diagnosis of SARS-CoV-2 by FDA under an Emergency Use Authorization (EUA). This EUA will remain  in effect (meaning this test can be used) for the duration of the COVID-19 declaration under Section 564(b)(1) of the Act, 21 U.S.C.section 360bbb-3(b)(1), unless the authorization is terminated  or revoked sooner.       Influenza A by PCR NEGATIVE NEGATIVE Final   Influenza B by PCR NEGATIVE NEGATIVE Final    Comment: (NOTE) The Xpert Xpress SARS-CoV-2/FLU/RSV plus assay is intended as an aid in the diagnosis of influenza from Nasopharyngeal swab specimens and should not be used as a sole basis for treatment. Nasal washings and aspirates are unacceptable for Xpert Xpress SARS-CoV-2/FLU/RSV testing.  Fact Sheet for Patients: BloggerCourse.com  Fact Sheet for Healthcare Providers: SeriousBroker.it  This test is not yet approved or cleared by the [place] FDA and has been authorized for detection and/or  diagnosis of SARS-CoV-2 by FDA under an Emergency Use Authorization (EUA). This EUA will remain in effect (meaning this test can be used) for the duration of the COVID-19 declaration under Section 564(b)(1) of the Act, 21 U.S.C. section 360bbb-3(b)(1), unless the authorization is terminated or revoked.  Performed at [name], [address], [place], [place] [address]   Blood Culture (routine x 2)     Status: None (Preliminary result)   Collection Time: 05/27/20  9:37 PM   Specimen: BLOOD RIGHT HAND  Result Value Ref Range Status   Specimen Description BLOOD RIGHT HAND  Final   Special Requests   Final    BOTTLES DRAWN AEROBIC AND ANAEROBIC Blood Culture results may not be optimal due to an inadequate volume of blood received in culture bottles   Culture   Final    NO GROWTH < 24 HOURS Performed at [facility], [address] [place], [place] [address]    Report Status PENDING  Incomplete         Radiology Studies: CT ABDOMEN PELVIS W CONTRAST  Result Date: 05/27/2020 CLINICAL DATA:  Diffuse abdominal pain, diarrhea, C diff colitis EXAM: CT ABDOMEN AND PELVIS WITH CONTRAST TECHNIQUE: Multidetector CT imaging of the abdomen and pelvis was performed using the standard protocol following bolus administration of intravenous contrast. CONTRAST:  OMNIPAQUE IOHEXOL 300 MG/ML  SOLN COMPARISON:  None. FINDINGS: Lower chest: Mild left basilar scarring. The visualized lung bases are otherwise clear. The visualized heart and pericardium are unremarkable Hepatobiliary: Severe hepatic steatosis. No enhancing liver lesions. Liver size within normal limits. No intra or extrahepatic biliary ductal dilation. Gallbladder unremarkable. Pancreas: There is mild peripancreatic edema identified surrounding the head of the pancreas, particularly within the pancreatico duodenal groove, suggesting changes of a mild pancreatitis. Normal enhancement of the pancreatic parenchyma. No  pancreatic ductal dilation. No parenchymal calcifications. No loculated peripancreatic fluid collections are identified. Spleen: Unremarkable Adrenals/Urinary Tract: Adrenal glands are unremarkable. Kidneys are normal, without renal calculi, focal lesion, or hydronephrosis. Bladder is unremarkable. Stomach/Bowel: The large bowel demonstrates diffuse, circumferential wall thickening in keeping with the given history of C diff colitis. No significant colonic dilation. No differential enhancement. No evidence of obstruction or perforation. There  is circumferential wall thickening involving the terminal ileum just proximal to the ileocecal junction likely representing backwash ileitis. The stomach and small bowel are otherwise unremarkable. No free intraperitoneal gas or fluid. Vascular/Lymphatic: Mild aortoiliac atherosclerotic calcification. No aortic aneurysm. The abdominal vasculature is otherwise unremarkable. No pathologic adenopathy. Reproductive: Prostate is unremarkable. Other: Circumferential wall thickening of the rectum is in keeping with the given history of infectious proctocolitis. Musculoskeletal: No acute bone abnormality. Bilateral L5 pars defects are identified with minimal anterolisthesis of L5 upon S1. T12 butterfly vertebra noted. No lytic or blastic bone lesion. IMPRESSION: Peripancreatic edema surrounding the pancreatic head and within the pancreatico duodenal groove in keeping with changes of mild uncomplicated pancreatitis. Diffuse bowel wall thickening involving the colon and terminal ileum in keeping with changes of infectious proctocolitis and probable backwash ileitis. No evidence of a bowel infarction, perforation, or obstruction. Severe hepatic steatosis Aortic Atherosclerosis (ICD10-I70.0). Electronically Signed   By: Helyn Numbers MD   On: 05/27/2020 23:22   DG Chest Port 1 View  Result Date: 05/27/2020 CLINICAL DATA:  Questionable sepsis. EXAM: PORTABLE CHEST 1 VIEW COMPARISON:   Chest x-ray 04/06/2020, CT chest 03/30/2018 FINDINGS: The heart size and mediastinal contours are within normal limits. No focal consolidation. No pulmonary edema. No pleural effusion. No pneumothorax. No acute osseous abnormality. IMPRESSION: No active disease. Electronically Signed   By: Tish Frederickson M.D.   On: 05/27/2020 20:40   [place] RT LOWER EXTREM LTD SOFT TISSUE NON VASCULAR  Result Date: 05/28/2020 CLINICAL DATA:  IV drug use, right thigh cellulitis EXAM: ULTRASOUND RIGHT LOWER EXTREMITY LIMITED TECHNIQUE: Ultrasound examination of the lower extremity soft tissues was performed in the area of clinical concern. COMPARISON:  None. FINDINGS: Joint Space: Not applicable Muscles: Not well assessed on this examination Tendons: Not applicable Other Soft Tissue Structures: Limited grayscale and color Doppler sonography in the area of reported erythema demonstrates several shotty subcutaneous lymph nodes, however, no frankly pathologic adenopathy identified. No abnormal subcutaneous soft tissue masses, fluid collections, or calcifications are identified on the presented images. IMPRESSION: No discrete drainable fluid collection within the area of reported erythema. Electronically Signed   By: Helyn Numbers MD   On: 05/28/2020 02:54   [place] EKG SITE RITE  Result Date: 05/28/2020 If Site Rite image not attached, placement could not be confirmed due to current cardiac rhythm.       Scheduled Meds: . Chlorhexidine Gluconate Cloth  6 each Topical Daily  . enoxaparin (LOVENOX) injection  40 mg Subcutaneous Q24H  . feeding supplement  237 mL Oral BID BM  . folic acid  1 mg Oral Daily  . gabapentin  600 mg Oral TID  . insulin aspart  0-20 Units Subcutaneous TID WC  . insulin aspart  0-5 Units Subcutaneous QHS  . multivitamin with minerals  1 tablet Oral Daily  . potassium chloride  40 mEq Oral BID  . thiamine  100 mg Oral Daily   Or  . thiamine  100 mg Intravenous Daily  . vancomycin  125 mg Oral  QID   Continuous Infusions: . 0.9 % NaCl with KCl 20 mEq / L 150 mL/hr at 05/29/20 0653  . dextrose 5 % and 0.9% NaCl Stopped (05/28/20 1610)     LOS: 1 day    Time spent: 40 min   Azucena Fallen, DO [facility] Available via Epic secure chat 7am-7pm After these hours, please refer to coverage provider listed on [email or web address] 05/29/2020, 7:17 AM

## 2020-05-29 NOTE — TOC Initial Note (Signed)
Transition of Care [facility]) - Initial/Assessment Note    Patient Details  Name: Tony Long MRN: [id number] Date of Birth: February 05, 1976  Transition of [facility] CM/SW Contact:    Golda Acre, RN Phone Number: 05/29/2020, 8:33 AM  Clinical Narrative:                 44 year old male with past medical history of IV drug use (abstinent x 1 year), infective endocarditis (03/2018), hypertension, chronic hepatitis C, insulin-dependent diabetes mellitus type 2, peripheral neuropathy presents to [facility] emergency department complaints of weakness and diarrhea.  Of note, patient was hospitalized at [facility] October 8 until October 11 of 2021 for diabetic ketoacidosis and suspected soft tissue infection with anterior chest wall abscesses.  Patient was initially treated with oral Augmentin prior to hospitalization, followed by a course of intravenous vancomycin during that hospitalization followed by several more days of oral doxycycline at time of discharge.  Patient explains that shortly after this hospitalization began to develop watery diarrhea.  This watery diarrhea has been occurring at least 6-8 times daily without any evidence of blood she has been experiencing associated generalized abdominal pain that he describes as sore in quality and moderate to severe in intensity.    Patient's diarrhea persisted for several weeks until the patient eventually spoke to his primary care provider via telemedicine evaluation on 11/2.  At that time, patient was initiated on as needed loperamide.  Patient did not see an improvement with this medication for stool studies were obtained on 11/16 which was positive for C. difficile.  Patient was prescribed a course of oral Flagyl on 1/22.  Patient denies any fevers, sick contacts, nausea, vomiting, recent travel or recent ingestion of undercooked food.  Despite the patient taking as needed Flagyl he continued to exhibit  worsening symptoms.  Patient states that by Thanksgiving, he began to develop intense generalized weakness and progressively worsening poor appetite PLAN: return to home has pcp lives byself Following for progression. Expected Discharge Plan: Home/Self Care Barriers to Discharge: No Barriers Identified   Patient Goals and CMS Choice Patient states their goals for this hospitalization and ongoing recovery are:: to go home CMS Medicare.gov Compare Post Acute Care list provided to:: Patient    Expected Discharge Plan and Services Expected Discharge Plan: Home/Self Care   Discharge Planning Services: CM Consult   Living arrangements for the past 2 months: Single Family Home                                      Prior Living Arrangements/Services Living arrangements for the past 2 months: Single Family Home Lives with:: Self Patient language and need for interpreter reviewed:: Yes Do you feel safe going back to the place where you live?: Yes      Need for Family Participation in Patient Care: No (Comment) Care giver support system in place?: No (comment)   Criminal Activity/Legal Involvement Pertinent to Current Situation/Hospitalization: No - Comment as needed  Activities of Daily Living Home Assistive Devices/Equipment: Eyeglasses, Cane (specify quad or straight), Walker (specify type) ADL Screening (condition at time of admission) Patient's cognitive ability adequate to safely complete daily activities?: Yes Is the patient deaf or have difficulty hearing?: No Does the patient have difficulty seeing, even when wearing glasses/contacts?: No Does the patient have difficulty concentrating, remembering, or making decisions?: No Patient able to express need for  assistance with ADLs?: Yes Does the patient have difficulty dressing or bathing?: No Independently performs ADLs?: Yes (appropriate for developmental age) Does the patient have difficulty walking or climbing stairs?:  Yes Weakness of Legs: Both Weakness of Arms/Hands: Both  Permission Sought/Granted                  Emotional Assessment Appearance:: Appears stated age Attitude/Demeanor/Rapport: Engaged Affect (typically observed): Calm Orientation: : Oriented to  Time, Oriented to Place, Oriented to Self, Oriented to Situation Alcohol / Substance Use: Not Applicable Psych Involvement: No (comment)  Admission diagnosis:  Dehydration [E86.0] Hypokalemia [E87.6] Colitis [K52.9] DKA (diabetic ketoacidosis) (HCC) [E11.10] Soft tissue injury of right thigh, initial encounter [Q65.784O] Colitis due to Clostridioides difficile [A04.72] Patient Active Problem List   Diagnosis Date Noted  . Colitis due to Clostridioides difficile 05/28/2020  . Hypokalemia due to excessive gastrointestinal loss of potassium 05/28/2020  . Lactic acidosis 05/28/2020  . History of intravenous drug abuse (HCC) 05/28/2020  . Acute alcoholic pancreatitis 05/28/2020  . Diabetic polyneuropathy associated with type 2 diabetes mellitus (HCC) 05/28/2020  . Open wound of right thigh 05/28/2020  . Alcohol abuse 05/28/2020  . Pressure injury of skin 04/08/2020  . Abscess 04/07/2020  . DKA (diabetic ketoacidosis) (HCC) 04/06/2020  . Hemoglobin A1C greater than 9%, indicating poor diabetic control 08/09/2019  . History of delayed wound healing 08/09/2019  . IV drug user 08/09/2019  . Neuropathy 03/29/2019  . Wound of right foot 03/29/2019  . Essential hypertension 05/17/2018  . 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 Opioid use disorder, severe, dependence (HCC) 03/21/2018  . Osteomyelitis (HCC) 03/20/2018  . Chronic hepatitis C without hepatic coma (HCC) 07/02/2016   PCP:  [name], FNP Pharmacy:   [facility] [address], [place] - [address] [place] [address] Phone: [phone] Fax: [phone]  [name] [place], [place] - [address] [place] [address] Phone: [phone] Fax: [phone]     Social Determinants of Health (SDOH) Interventions    Readmission Risk Interventions No flowsheet data found.

## 2020-05-29 NOTE — Progress Notes (Signed)
Inpatient Diabetes Program Recommendations  AACE/ADA: New Consensus Statement on Inpatient Glycemic Control (2015)  Target Ranges:  Prepandial:   less than 140 mg/dL      Peak postprandial:   less than 180 mg/dL (1-2 hours)      Critically ill patients:  140 - 180 mg/dL   Lab Results  Component Value Date   GLUCAP 308 (H) 05/29/2020   HGBA1C 8.8 (H) 05/28/2020    Review of Glycemic Control  Diabetes history: DM2  Outpatient Diabetes medications:   Lantus 30 units QHS Humalog 10 units TID per SSI Jardiance 10 mg Daily  Current orders for Inpatient glycemic control:   Novolog 0-20 units tidwc and 0-5 QHS Needs basal + meal coverage insulin K+ 3.6, AG 7, CO2 29 Transitioned off drip.  Inpatient Diabetes Program Recommendations:  Add Lantus 20 units Q24H Add Novolog 6 units tidwc for meal coverage  Follow closely.  Thank you. Ailene Ards, RD, LDN, CDE Inpatient Diabetes Coordinator [phone]

## 2020-05-30 LAB — COMPREHENSIVE METABOLIC PANEL
ALT: 20 U/L (ref 0–44)
AST: 31 U/L (ref 15–41)
Albumin: 2.6 g/dL — ABNORMAL LOW (ref 3.5–5.0)
Alkaline Phosphatase: 220 U/L — ABNORMAL HIGH (ref 38–126)
Anion gap: 6 (ref 5–15)
BUN: 8 mg/dL (ref 6–20)
CO2: 28 mmol/L (ref 22–32)
Calcium: 8.2 mg/dL — ABNORMAL LOW (ref 8.9–10.3)
Chloride: 100 mmol/L (ref 98–111)
Creatinine, Ser: 0.59 mg/dL — ABNORMAL LOW (ref 0.61–1.24)
GFR, Estimated: >60 mL/min (ref >60)
Glucose, Bld: 274 mg/dL — ABNORMAL HIGH (ref 70–99)
Potassium: 4.3 mmol/L (ref 3.5–5.1)
Sodium: 134 mmol/L — ABNORMAL LOW (ref 135–145)
Total Bilirubin: 0.9 mg/dL (ref 0.3–1.2)
Total Protein: 6.1 g/dL — ABNORMAL LOW (ref 6.5–8.1)

## 2020-05-30 LAB — GLUCOSE, CAPILLARY: Glucose-Capillary: 207 mg/dL — ABNORMAL HIGH (ref 70–99)

## 2020-05-30 LAB — CBC
HCT: 29.9 % — ABNORMAL LOW (ref 39.0–52.0)
Hemoglobin: 9.7 g/dL — ABNORMAL LOW (ref 13.0–17.0)
MCH: 34.6 pg — ABNORMAL HIGH (ref 26.0–34.0)
MCHC: 32.4 g/dL (ref 30.0–36.0)
MCV: 106.8 fL — ABNORMAL HIGH (ref 80.0–100.0)
Platelets: 113 10*3/uL — ABNORMAL LOW (ref 150–400)
RBC: 2.8 MIL/uL — ABNORMAL LOW (ref 4.22–5.81)
RDW: 14.7 % (ref 11.5–15.5)
WBC: 6 10*3/uL (ref 4.0–10.5)
nRBC: 0 % (ref 0.0–0.2)

## 2020-05-30 MED ORDER — VANCOMYCIN 50 MG/ML ORAL SOLUTION: 125.0000 mg | Freq: Four times a day (QID) | ORAL | 0 refills | Status: DC

## 2020-05-30 MED ORDER — INSULIN GLARGINE 100 UNIT/ML SOLOSTAR PEN: 30.0000 [IU] | PEN_INJECTOR | Freq: Every day | SUBCUTANEOUS | Status: AC

## 2020-05-30 MED ORDER — INSULIN LISPRO 100 UNIT/ML ~~LOC~~ SOLN: 10.0000 [IU] | Freq: Three times a day (TID) | SUBCUTANEOUS | Status: AC

## 2020-05-30 MED ORDER — INSULIN ASPART 100 UNIT/ML ~~LOC~~ SOLN: 6.0000 [IU] | Freq: Three times a day (TID) | SUBCUTANEOUS | Status: DC

## 2020-05-30 MED ORDER — ADULT MULTIVITAMIN W/MINERALS CH
1.0000 | ORAL_TABLET | Freq: Every day | ORAL | Status: DC
Start: 2020-05-30 — End: 2020-09-12

## 2020-05-30 MED ORDER — INSULIN GLARGINE 100 UNIT/ML ~~LOC~~ SOLN: 25.0000 [IU] | Freq: Every day | SUBCUTANEOUS | Status: DC

## 2020-05-30 MED ORDER — THIAMINE HCL 100 MG PO TABS
100.0000 mg | ORAL_TABLET | Freq: Every day | ORAL | 0 refills | Status: DC
Start: 2020-05-30 — End: 2020-09-12

## 2020-05-30 MED ORDER — FOLIC ACID 1 MG PO TABS
1.0000 mg | ORAL_TABLET | Freq: Every day | ORAL | 0 refills | Status: DC
Start: 2020-05-30 — End: 2020-09-12

## 2020-05-30 MED ORDER — POTASSIUM CHLORIDE CRYS ER 20 MEQ PO TBCR: 20.0000 meq | EXTENDED_RELEASE_TABLET | Freq: Every day | ORAL | 0 refills | Status: DC

## 2020-05-30 NOTE — Progress Notes (Signed)
Pt states he is leaving the hospital whether or not the dr approves it.  PICC line removed.  Pt verbalizes interventions and signs of infection.  Also aware to remove dressing in 24-48 hours.  No signs of infection noted.

## 2020-05-30 NOTE — Progress Notes (Signed)
Patient given discharge instructions, all questions answered. Medications reviewed and given. PICC removed by IV team previously. Patient wheeled down to main entrance, patient's father was waiting for him.

## 2020-05-30 NOTE — Discharge Summary (Signed)
Physician Discharge Summary  Tony Long IDH:[id number] DOB: 1976/03/12 DOA: 05/27/2020  PCP: [name], FNP  Admit date: 05/27/2020 Discharge date: 05/30/2020  Admitted From: Home Disposition: Home  Recommendations for Outpatient Follow-up:  1. Follow up with PCP in 1 week with repeat CBC/BMP 2. Comply with medications and follow-up 3. Follow up in ED if symptoms worsen or new appear   Home Health: No Equipment/Devices: None  Discharge Condition: Stable CODE STATUS: Full Diet recommendation: Heart healthy/carb modified  Brief/Interim Summary: 44 year old male with past medical history significant for endocarditis with previous history of IV drug use, essential hypertension, chronic hepatitis C, type 2 diabetes mellitus, peripheral neuropathy, recent hospitalization at [facility] from October 8-11, 2021 for DKA and suspected soft tissue infection of the anterior chest wall treated with IV vancomycin followed by Augmentin and doxycycline  presented to [name], ED with progressive weakness with persistent diarrhea.  Patient was seen by his PCP on 05/15/2020 and diagnosed with C. difficile colitis and started on treatment with Flagyl on 11/22.  Despite treatment, symptoms persisted.  On presentation, COVID-19 testing was negative; he was found to be in DKA and was started on IV fluids and insulin drip. CT abdomen/pelvis with peripancreatic edema with mild uncomplicated pancreatitis, diffuse bowel wall thickening involving colon and terminal ileum, no bowel infarction, perforation or obstruction.  Chest x-ray with no acute cardiopulmonary disease process.  He was also started on oral vancomycin.  During the hospitalization, his anion gap closed and he was switched to long-acting insulin.  His diarrhea is also slowing down and he is tolerating diet.  He wants to go home today.  He will be discharged home on oral vancomycin.  Discharge Diagnoses:   Diabetic ketoacidosis in a patient with  uncontrolled diabetes mellitus type 2 with hyperglycemia -Patient presented in DKA and was treated with IV fluids and insulin drip. -After his anion gap closed, he has already been transitioned to long-acting insulin.  Blood sugars improving but still on the higher side.  Carb modified diet.  Resume home regimen of Lantus 30 to 40 units at bedtime and 10 units of Humalog 3 times a day with meals.  Outpatient follow-up with PCP. -Currently tolerated blood.  C. difficile colitis -Patient was found to be C. difficile positive on 05/15/2020 and was treated with oral Flagyl as an outpatient but did not improve.  Treated with oral vancomycin inpatient.  Diarrhea improving.  Continue oral vancomycin for at least 10 more days on discharge.  Outpatient follow-up with PCP.  Severe hypokalemia -Resolved.  Continue oral potassium supplementation at 20 mEq daily upon discharge.  Outpatient follow-up  Hypophosphatemia -Treated with replacement  Mild alcohol induced pancreatitis History of alcohol abuse -Minimally elevated lipase at intake, CT was remarkable for peripancreatic edema Clinically patient does not appear to have pancreatitis -Benign abdominal exam.  Tolerating diet. -Treated with CIWA protocol.  No signs of withdrawal currently.  Continue multivitamin, thiamine and folic acid on discharge.  Needs to abstain from alcohol  History of IV drug abuse -Patient reports abstinence x1 year. encouraged to maintain complete cessation.  Thigh lesion/ulcer Soft tissue ultrasound with no drainable fluid collection. -Outpatient follow-up  Peripheral neuropathy --Continue home gabapentin.  Outpatient follow-up Discharge Instructions  Discharge Instructions    Diet - low sodium heart healthy   Complete by: As directed    Diet Carb Modified   Complete by: As directed    Discharge wound care:   Complete by: As directed    Local  wound care.  Follow-up with PCP regarding further instructions.    Increase activity slowly   Complete by: As directed      Allergies as of 05/30/2020   No Known Allergies     Medication List    STOP taking these medications   cyclobenzaprine 5 MG tablet Commonly known as: FLEXERIL   diphenoxylate-atropine 2.5-0.025 MG/5ML liquid Commonly known as: LOMOTIL   lisinopril 5 MG tablet Commonly known as: ZESTRIL   metoprolol tartrate 50 MG tablet Commonly known as: LOPRESSOR   metroNIDAZOLE 500 MG tablet Commonly known as: FLAGYL   Potassium Chloride ER 20 MEQ Tbcr   sildenafil 25 MG tablet Commonly known as: VIAGRA     TAKE these medications   folic acid 1 MG tablet Commonly known as: FOLVITE Take 1 tablet (1 mg total) by mouth daily.   furosemide 20 MG tablet Commonly known as: LASIX Take 1 tablet (20 mg total) by mouth 2 (two) times daily as needed. What changed: reasons to take this   gabapentin 300 MG capsule Commonly known as: NEURONTIN Take 2 capsule (600 mg= total) by mouth, 3 times a day.   glucose blood test strip Use as instructed   insulin glargine 100 UNIT/ML Solostar Pen Commonly known as: LANTUS Inject 30 Units into the skin daily at 10 pm.   insulin lispro 100 UNIT/ML injection Commonly known as: HumaLOG Inject 0.1 mLs (10 Units total) into the skin 3 (three) times daily with meals. PER SLIDING SCALE   Jardiance 10 MG Tabs tablet Generic drug: empagliflozin Take 10 mg by mouth daily before breakfast.   multivitamin with minerals Tabs tablet Take 1 tablet by mouth daily.   onetouch ultrasoft lancets Use as instructed   potassium chloride SA 20 MEQ tablet Commonly known as: KLOR-CON Take 1 tablet (20 mEq total) by mouth daily for 10 days.   pravastatin 40 MG tablet Commonly known as: PRAVACHOL Take 1 tablet (40 mg total) by mouth daily.   thiamine 100 MG tablet Take 1 tablet (100 mg total) by mouth daily.   vancomycin 50 mg/mL  oral solution Commonly known as: VANCOCIN Take 2.5 mLs (125 mg  total) by mouth every 6 (six) hours for 10 days.   Vitamin D (Ergocalciferol) 1.25 MG (50000 UNIT) Caps capsule Commonly known as: DRISDOL Take 1 capsule (50,000 Units total) by mouth every 7 (seven) days.            Discharge Care Instructions  (From admission, onward)         Start     Ordered   05/30/20 0000  Discharge wound care:       Comments: Local wound care.  Follow-up with PCP regarding further instructions.   05/30/20 1610          Follow-up Information    [name], FNP. Schedule an appointment as soon as possible for a visit in 1 week(s).   Specialty: Family Medicine Why: with cbc/bmp Contact information: [address] [place] [address] [phone]              No Known Allergies  Consultations:  None   Procedures/Studies: CT ABDOMEN PELVIS W CONTRAST  Result Date: 05/27/2020 CLINICAL DATA:  Diffuse abdominal pain, diarrhea, C diff colitis EXAM: CT ABDOMEN AND PELVIS WITH CONTRAST TECHNIQUE: Multidetector CT imaging of the abdomen and pelvis was performed using the standard protocol following bolus administration of intravenous contrast. CONTRAST:  OMNIPAQUE IOHEXOL 300 MG/ML  SOLN COMPARISON:  None.  FINDINGS: Lower chest: Mild left basilar scarring. The visualized lung bases are otherwise clear. The visualized heart and pericardium are unremarkable Hepatobiliary: Severe hepatic steatosis. No enhancing liver lesions. Liver size within normal limits. No intra or extrahepatic biliary ductal dilation. Gallbladder unremarkable. Pancreas: There is mild peripancreatic edema identified surrounding the head of the pancreas, particularly within the pancreatico duodenal groove, suggesting changes of a mild pancreatitis. Normal enhancement of the pancreatic parenchyma. No pancreatic ductal dilation. No parenchymal calcifications. No loculated peripancreatic fluid collections are identified. Spleen: Unremarkable Adrenals/Urinary Tract: Adrenal  glands are unremarkable. Kidneys are normal, without renal calculi, focal lesion, or hydronephrosis. Bladder is unremarkable. Stomach/Bowel: The large bowel demonstrates diffuse, circumferential wall thickening in keeping with the given history of C diff colitis. No significant colonic dilation. No differential enhancement. No evidence of obstruction or perforation. There is circumferential wall thickening involving the terminal ileum just proximal to the ileocecal junction likely representing backwash ileitis. The stomach and small bowel are otherwise unremarkable. No free intraperitoneal gas or fluid. Vascular/Lymphatic: Mild aortoiliac atherosclerotic calcification. No aortic aneurysm. The abdominal vasculature is otherwise unremarkable. No pathologic adenopathy. Reproductive: Prostate is unremarkable. Other: Circumferential wall thickening of the rectum is in keeping with the given history of infectious proctocolitis. Musculoskeletal: No acute bone abnormality. Bilateral L5 pars defects are identified with minimal anterolisthesis of L5 upon S1. T12 butterfly vertebra noted. No lytic or blastic bone lesion. IMPRESSION: Peripancreatic edema surrounding the pancreatic head and within the pancreatico duodenal groove in keeping with changes of mild uncomplicated pancreatitis. Diffuse bowel wall thickening involving the colon and terminal ileum in keeping with changes of infectious proctocolitis and probable backwash ileitis. No evidence of a bowel infarction, perforation, or obstruction. Severe hepatic steatosis Aortic Atherosclerosis (ICD10-I70.0). Electronically Signed   By: Helyn Numbers MD   On: 05/27/2020 23:22   DG Chest Port 1 View  Result Date: 05/27/2020 CLINICAL DATA:  Questionable sepsis. EXAM: PORTABLE CHEST 1 VIEW COMPARISON:  Chest x-ray 04/06/2020, CT chest 03/30/2018 FINDINGS: The heart size and mediastinal contours are within normal limits. No focal consolidation. No pulmonary edema. No pleural  effusion. No pneumothorax. No acute osseous abnormality. IMPRESSION: No active disease. Electronically Signed   By: Tish Frederickson M.D.   On: 05/27/2020 20:40   [place] RT LOWER EXTREM LTD SOFT TISSUE NON VASCULAR  Result Date: 05/28/2020 CLINICAL DATA:  IV drug use, right thigh cellulitis EXAM: ULTRASOUND RIGHT LOWER EXTREMITY LIMITED TECHNIQUE: Ultrasound examination of the lower extremity soft tissues was performed in the area of clinical concern. COMPARISON:  None. FINDINGS: Joint Space: Not applicable Muscles: Not well assessed on this examination Tendons: Not applicable Other Soft Tissue Structures: Limited grayscale and color Doppler sonography in the area of reported erythema demonstrates several shotty subcutaneous lymph nodes, however, no frankly pathologic adenopathy identified. No abnormal subcutaneous soft tissue masses, fluid collections, or calcifications are identified on the presented images. IMPRESSION: No discrete drainable fluid collection within the area of reported erythema. Electronically Signed   By: Helyn Numbers MD   On: 05/28/2020 02:54   VAS [place] LOWER EXTREMITY VENOUS (DVT)  Result Date: 05/14/2020  Lower Venous DVT Study Indications: Swelling, and Pain.  Comparison Study: no prior Performing Technologist: Blanch Media RVS  Examination Guidelines: A complete evaluation includes B-mode imaging, spectral Doppler, color Doppler, and power Doppler as needed of all accessible portions of each vessel. Bilateral testing is considered an integral part of a complete examination. Limited examinations for reoccurring indications may be performed as noted. The reflux  portion of the exam is performed with the patient in reverse Trendelenburg.  +---------+---------------+---------+-----------+----------+--------------+ RIGHT    CompressibilityPhasicitySpontaneityPropertiesThrombus Aging +---------+---------------+---------+-----------+----------+--------------+ CFV      Full            Yes      Yes                                 +---------+---------------+---------+-----------+----------+--------------+ SFJ      Full                                                        +---------+---------------+---------+-----------+----------+--------------+ FV Prox  Full                                                        +---------+---------------+---------+-----------+----------+--------------+ FV Mid   Full                                                        +---------+---------------+---------+-----------+----------+--------------+ FV DistalFull                                                        +---------+---------------+---------+-----------+----------+--------------+ PFV      Full                                                        +---------+---------------+---------+-----------+----------+--------------+ POP      Full           Yes      Yes                                 +---------+---------------+---------+-----------+----------+--------------+ PTV      Full                                                        +---------+---------------+---------+-----------+----------+--------------+ PERO     Full                                                        +---------+---------------+---------+-----------+----------+--------------+   +---------+---------------+---------+-----------+----------+--------------+ LEFT     CompressibilityPhasicitySpontaneityPropertiesThrombus Aging +---------+---------------+---------+-----------+----------+--------------+ CFV      Full           Yes  Yes                                 +---------+---------------+---------+-----------+----------+--------------+ SFJ      Full                                                        +---------+---------------+---------+-----------+----------+--------------+ FV Prox  Full                                                         +---------+---------------+---------+-----------+----------+--------------+ FV Mid   Full                                                        +---------+---------------+---------+-----------+----------+--------------+ FV DistalFull                                                        +---------+---------------+---------+-----------+----------+--------------+ PFV      Full                                                        +---------+---------------+---------+-----------+----------+--------------+ POP      Full           Yes      Yes                                 +---------+---------------+---------+-----------+----------+--------------+ PTV      Full                                                        +---------+---------------+---------+-----------+----------+--------------+ PERO     Full                                                        +---------+---------------+---------+-----------+----------+--------------+     Summary: BILATERAL: - No evidence of deep vein thrombosis seen in the lower extremities, bilaterally. - No evidence of superficial venous thrombosis in the lower extremities, bilaterally. -   *See table(s) above for measurements and observations. Electronically signed by Lemar Livings MD on 05/14/2020 at 4:29:19 PM.    Final    [place] EKG SITE RITE  Result Date: 05/28/2020 If Site Rite image not attached, placement could not be confirmed  due to current cardiac rhythm.      Subjective: Patient seen and examined at bedside.  Feels much better and tolerating diet.  Diarrhea is improving.  Wants to go home.  No overnight fever or vomiting reported.  Discharge Exam: Vitals:   05/30/20 0800 05/30/20 0900  BP: 111/68 113/75  Pulse:    Resp: 16 17  Temp: 98.7 F (37.1 C)   SpO2:      General: Pt is alert, awake, not in acute distress Cardiovascular: rate controlled, S1/S2 + Respiratory: bilateral decreased breath sounds at  bases Abdominal: Soft, NT, ND, bowel sounds + Extremities: no edema, no cyanosis    The results of significant diagnostics from this hospitalization (including imaging, microbiology, ancillary and laboratory) are listed below for reference.     Microbiology: Recent Results (from the past 240 hour(s))  Blood Culture (routine x 2)     Status: None (Preliminary result)   Collection Time: 05/27/20  8:19 PM   Specimen: BLOOD  Result Value Ref Range Status   Specimen Description BLOOD RIGHT ANTECUBITAL  Final   Special Requests   Final    BOTTLES DRAWN AEROBIC AND ANAEROBIC Blood Culture results may not be optimal due to an inadequate volume of blood received in culture bottles   Culture   Final    NO GROWTH 3 DAYS Performed at [facility], [address] [place], [place] [address]    Report Status PENDING  Incomplete  Resp Panel by RT-PCR (Flu A&B, Covid) Nasopharyngeal Swab     Status: None   Collection Time: 05/27/20  8:19 PM   Specimen: Nasopharyngeal Swab; Nasopharyngeal(NP) swabs in vial transport medium  Result Value Ref Range Status   SARS Coronavirus 2 by RT PCR NEGATIVE NEGATIVE Final    Comment: (NOTE) SARS-CoV-2 target nucleic acids are NOT DETECTED.  The SARS-CoV-2 RNA is generally detectable in upper respiratory specimens during the acute phase of infection. The lowest concentration of SARS-CoV-2 viral copies this assay can detect is 138 copies/mL. A negative result does not preclude SARS-Cov-2 infection and should not be used as the sole basis for treatment or other patient management decisions. A negative result may occur with  improper specimen collection/handling, submission of specimen other than nasopharyngeal swab, presence of viral mutation(s) within the areas targeted by this assay, and inadequate number of viral copies(<138 copies/mL). A negative result must be combined with clinical observations, patient history, and epidemiological information.  The expected result is Negative.  Fact Sheet for Patients:  BloggerCourse.com  Fact Sheet for Healthcare Providers:  SeriousBroker.it  This test is no t yet approved or cleared by the [place] FDA and  has been authorized for detection and/or diagnosis of SARS-CoV-2 by FDA under an Emergency Use Authorization (EUA). This EUA will remain  in effect (meaning this test can be used) for the duration of the COVID-19 declaration under Section 564(b)(1) of the Act, 21 U.S.C.section 360bbb-3(b)(1), unless the authorization is terminated  or revoked sooner.       Influenza A by PCR NEGATIVE NEGATIVE Final   Influenza B by PCR NEGATIVE NEGATIVE Final    Comment: (NOTE) The Xpert Xpress SARS-CoV-2/FLU/RSV plus assay is intended as an aid in the diagnosis of influenza from Nasopharyngeal swab specimens and should not be used as a sole basis for treatment. Nasal washings and aspirates are unacceptable for Xpert Xpress SARS-CoV-2/FLU/RSV testing.  Fact Sheet for Patients: BloggerCourse.com  Fact Sheet for Healthcare Providers: SeriousBroker.it  This test is not  yet approved or cleared by the [place] and has been authorized for detection and/or diagnosis of SARS-CoV-2 by FDA under an Emergency Use Authorization (EUA). This EUA will remain in effect (meaning this test can be used) for the duration of the COVID-19 declaration under Section 564(b)(1) of the Act, 21 U.S.C. section 360bbb-3(b)(1), unless the authorization is terminated or revoked.  Performed at [facility], [address], [place], [place] [address]   Blood Culture (routine x 2)     Status: None (Preliminary result)   Collection Time: 05/27/20  9:37 PM   Specimen: BLOOD RIGHT HAND  Result Value Ref Range Status   Specimen Description BLOOD RIGHT HAND  Final   Special Requests   Final    BOTTLES DRAWN  AEROBIC AND ANAEROBIC Blood Culture results may not be optimal due to an inadequate volume of blood received in culture bottles   Culture   Final    NO GROWTH 3 DAYS Performed at [facility], [address] [place], [place] [address]    Report Status PENDING  Incomplete  Urine culture     Status: None   Collection Time: 05/27/20 10:14 PM   Specimen: In/Out Cath Urine  Result Value Ref Range Status   Specimen Description IN/OUT CATH URINE  Final   Special Requests NONE  Final   Culture   Final    NO GROWTH Performed at [facility], [address], [place], [place] [address]    Report Status 05/29/2020 FINAL  Final     Labs: BNP (last 3 results) No results for input(s): BNP in the last 8760 hours. Basic Metabolic Panel: Recent Labs  Lab 05/27/20 2209 05/28/20 0539 05/28/20 0943 05/28/20 1447 05/29/20 0200 05/29/20 0227 05/29/20 1207 05/30/20 0500  NA  --    < > 137 134* 138  --  135 134*  K  --    < > <2.0* 2.6* 3.6  --  4.3 4.3  CL  --    < > 91* 92* 102  --  101 100  CO2  --    < > 29 21* 29  --  28 28  GLUCOSE  --    < > 203* 388* 238*  --  282* 274*  BUN  --    < > <5* 6 6  --  6 8  CREATININE  --    < > 0.98 0.79 0.67  --  0.70 0.59*  CALCIUM  --    < > 7.6* 7.3* 7.5*  --  7.5* 8.2*  MG 2.1  --   --   --  1.9  --   --   --   PHOS  --   --   --   --   --  <1.0*  --   --    < > = values in this interval not displayed.   Liver Function Tests: Recent Labs  Lab 05/27/20 2019 05/29/20 0200 05/30/20 0500  AST 33 26 31  ALT ALKPHOS 308* 193* 220*  BILITOT 1.2 1.2 0.9  PROT 6.2* 4.9* 6.1*  ALBUMIN 2.5* 2.1* 2.6*   Recent Labs  Lab 05/27/20 2019  LIPASE 51   No results for input(s): AMMONIA in the last 168 hours. CBC: Recent Labs  Lab 05/27/20 2019 05/27/20 2051 05/28/20 0539 05/30/20 0500  WBC 7.8  --  8.8 6.0  NEUTROABS 6.2  --  7.5  --  HGB 12.4* 13.9 10.2* 9.7*  HCT 39.2 41.0 30.6* 29.9*  MCV 106.2*  --  103.4* 106.8*   PLT 203  --  149* 113*   Cardiac Enzymes: No results for input(s): CKTOTAL, CKMB, CKMBINDEX, TROPONINI in the last 168 hours. BNP: Invalid input(s): POCBNP CBG: Recent Labs  Lab 05/29/20 0753 05/29/20 1158 05/29/20 1707 05/29/20 2147 05/30/20 0755  GLUCAP 308* 279* 216* 224* 207*   D-Dimer No results for input(s): DDIMER in the last 72 hours. Hgb A1c Recent Labs    05/28/20 0539  HGBA1C 8.8*   Lipid Profile No results for input(s): CHOL, HDL, LDLCALC, TRIG, CHOLHDL, LDLDIRECT in the last 72 hours. Thyroid function studies No results for input(s): TSH, T4TOTAL, T3FREE, THYROIDAB in the last 72 hours.  Invalid input(s): FREET3 Anemia work up No results for input(s): VITAMINB12, FOLATE, FERRITIN, TIBC, IRON, RETICCTPCT in the last 72 hours. Urinalysis    Component Value Date/Time   COLORURINE YELLOW 05/27/2020 2229   APPEARANCEUR CLEAR 05/27/2020 2229   LABSPEC 1.015 05/27/2020 2229   PHURINE 6.0 05/27/2020 2229   GLUCOSEU >=500 (A) 05/27/2020 2229   HGBUR NEGATIVE 05/27/2020 2229   BILIRUBINUR NEGATIVE 05/27/2020 2229   BILIRUBINUR neg 02/29/2020 0918   KETONESUR 80 (A) 05/27/2020 2229   PROTEINUR 100 (A) 05/27/2020 2229   UROBILINOGEN 1.0 02/29/2020 0918   UROBILINOGEN 1.0 12/11/2016 1020   NITRITE NEGATIVE 05/27/2020 2229   LEUKOCYTESUR NEGATIVE 05/27/2020 2229   Sepsis Labs Invalid input(s): PROCALCITONIN,  WBC,  LACTICIDVEN Microbiology Recent Results (from the past 240 hour(s))  Blood Culture (routine x 2)     Status: None (Preliminary result)   Collection Time: 05/27/20  8:19 PM   Specimen: BLOOD  Result Value Ref Range Status   Specimen Description BLOOD RIGHT ANTECUBITAL  Final   Special Requests   Final    BOTTLES DRAWN AEROBIC AND ANAEROBIC Blood Culture results may not be optimal due to an inadequate volume of blood received in culture bottles   Culture   Final    NO GROWTH 3 DAYS Performed at [facility], [address],  [place], [place] [address]    Report Status PENDING  Incomplete  Resp Panel by RT-PCR (Flu A&B, Covid) Nasopharyngeal Swab     Status: None   Collection Time: 05/27/20  8:19 PM   Specimen: Nasopharyngeal Swab; Nasopharyngeal(NP) swabs in vial transport medium  Result Value Ref Range Status   SARS Coronavirus 2 by RT PCR NEGATIVE NEGATIVE Final    Comment: (NOTE) SARS-CoV-2 target nucleic acids are NOT DETECTED.  The SARS-CoV-2 RNA is generally detectable in upper respiratory specimens during the acute phase of infection. The lowest concentration of SARS-CoV-2 viral copies this assay can detect is 138 copies/mL. A negative result does not preclude SARS-Cov-2 infection and should not be used as the sole basis for treatment or other patient management decisions. A negative result may occur with  improper specimen collection/handling, submission of specimen other than nasopharyngeal swab, presence of viral mutation(s) within the areas targeted by this assay, and inadequate number of viral copies(<138 copies/mL). A negative result must be combined with clinical observations, patient history, and epidemiological information. The expected result is Negative.  Fact Sheet for Patients:  BloggerCourse.com  Fact Sheet for Healthcare Providers:  SeriousBroker.it  This test is no t yet approved or cleared by the [place] FDA and  has been authorized for detection and/or diagnosis of SARS-CoV-2 by FDA under an Emergency Use Authorization (EUA). This EUA will  remain  in effect (meaning this test can be used) for the duration of the COVID-19 declaration under Section 564(b)(1) of the Act, 21 U.S.C.section 360bbb-3(b)(1), unless the authorization is terminated  or revoked sooner.       Influenza A by PCR NEGATIVE NEGATIVE Final   Influenza B by PCR NEGATIVE NEGATIVE Final    Comment: (NOTE) The Xpert Xpress SARS-CoV-2/FLU/RSV plus assay is  intended as an aid in the diagnosis of influenza from Nasopharyngeal swab specimens and should not be used as a sole basis for treatment. Nasal washings and aspirates are unacceptable for Xpert Xpress SARS-CoV-2/FLU/RSV testing.  Fact Sheet for Patients: BloggerCourse.comhttps://www.fda.gov/media/152166/download  Fact Sheet for Healthcare Providers: SeriousBroker.ithttps://www.fda.gov/media/152162/download  This test is not yet approved or cleared by the [place] FDA and has been authorized for detection and/or diagnosis of SARS-CoV-2 by FDA under an Emergency Use Authorization (EUA). This EUA will remain in effect (meaning this test can be used) for the duration of the COVID-19 declaration under Section 564(b)(1) of the Act, 21 U.S.C. section 360bbb-3(b)(1), unless the authorization is terminated or revoked.  Performed at [facility], [address] [place], [place] [address]   Blood Culture (routine x 2)     Status: None (Preliminary result)   Collection Time: 05/27/20  9:37 PM   Specimen: BLOOD RIGHT HAND  Result Value Ref Range Status   Specimen Description BLOOD RIGHT HAND  Final   Special Requests   Final    BOTTLES DRAWN AEROBIC AND ANAEROBIC Blood Culture results may not be optimal due to an inadequate volume of blood received in culture bottles   Culture   Final    NO GROWTH 3 DAYS Performed at [facility], [address] [place], [place] [address]    Report Status PENDING  Incomplete  Urine culture     Status: None   Collection Time: 05/27/20 10:14 PM   Specimen: In/Out Cath Urine  Result Value Ref Range Status   Specimen Description IN/OUT CATH URINE  Final   Special Requests NONE  Final   Culture   Final    NO GROWTH Performed at [facility], [address] [place], [place] [address]    Report Status 05/29/2020 FINAL  Final     Time coordinating discharge: 35 minutes  SIGNED:   Glade LloydKshitiz Raford Brissett, MD  [facility] 05/30/2020, 9:24 AM

## 2020-05-30 NOTE — Progress Notes (Signed)
Patient expressed to nightshift RN that he was hoping to be discharged this morning, but if he didn't receive discharge orders, that he would leave regardless. This RN verified this information with patient and stated that he would have to leave Against Medical Advise [name]) if the doctor didn't place discharge orders, patient verbalized understanding. MD made aware and notified of discussion with patient. Patient currently has a PICC line, therefore, because patient stated he would leave AMA if he didn't get discharged, MD okay to remove PICC line. IV team consult placed. Patient updated.  Will continue to monitor.

## 2020-05-31 ENCOUNTER — Telehealth: Payer: Self-pay

## 2020-05-31 NOTE — Telephone Encounter (Signed)
Transition Care Management Unsuccessful Follow-up Telephone Call  Date of discharge and from where: 05/30/2020 [facility]   Attempts:  1st Attempt  Reason for unsuccessful TCM follow-up call:  Left voice message

## 2020-06-01 LAB — CULTURE, BLOOD (ROUTINE X 2)
Culture: NO GROWTH
Culture: NO GROWTH

## 2020-06-01 NOTE — Telephone Encounter (Signed)
Transition Care Management Follow-up Telephone Call  Date of discharge and from where: 05/30/2020 [facility]   How have you been since you were released from the hospital? Doing good.   Any questions or concerns? No  Items Reviewed:  Did the pt receive and understand the discharge instructions provided? No   Medications obtained and verified? Yes   Other? No   Any new allergies since your discharge? No   Dietary orders reviewed? Yes  Do you have support at home? Yes   Home Care and Equipment/Supplies: Were home health services ordered? Wound care If so, what is the name of the agency? na  Has the agency set up a time to come to the patient's home? not applicable Were any new equipment or medical supplies ordered?  No What is the name of the medical supply agency? na Were you able to get the supplies/equipment? not applicable Do you have any questions related to the use of the equipment or supplies? No  Functional Questionnaire: (I = Independent and D = Dependent) ADLs: I  Bathing/Dressing- I  Meal Prep- I  Eating- I  Maintaining continence- I  Transferring/Ambulation- I  Managing Meds- I  Follow up appointments reviewed:   PCP Hospital f/u appt confirmed? Yes  Scheduled to see Tony Ip, NP  on 06/06/2020 @ 11:40am.  Specialist Hospital f/u appt confirmed? No    Are transportation arrangements needed? No   If their condition worsens, is the pt aware to call PCP or go to the Emergency Dept.? yesWas the patient provided with contact information for the PCP's office or ED? Yes  Was to pt encouraged to call back with questions or concerns? Yes

## 2020-06-06 ENCOUNTER — Inpatient Hospital Stay: Payer: Self-pay | Admitting: Family Medicine

## 2020-06-12 ENCOUNTER — Other Ambulatory Visit: Payer: Self-pay

## 2020-06-12 ENCOUNTER — Encounter: Payer: Self-pay | Admitting: Family Medicine

## 2020-06-12 ENCOUNTER — Ambulatory Visit (INDEPENDENT_AMBULATORY_CARE_PROVIDER_SITE_OTHER): Payer: Medicaid Other | Admitting: Family Medicine

## 2020-06-12 VITALS — BP 102/68 | HR 102 | Temp 97.9°F | Resp 18 | Ht 71.0 in | Wt 152.6 lb

## 2020-06-12 DIAGNOSIS — E119 Type 2 diabetes mellitus without complications: Secondary | ICD-10-CM

## 2020-06-12 DIAGNOSIS — A0472 Enterocolitis due to Clostridium difficile, not specified as recurrent: Secondary | ICD-10-CM

## 2020-06-12 DIAGNOSIS — I10 Essential (primary) hypertension: Secondary | ICD-10-CM

## 2020-06-12 DIAGNOSIS — S61203A Unspecified open wound of left middle finger without damage to nail, initial encounter: Secondary | ICD-10-CM | POA: Diagnosis not present

## 2020-06-12 DIAGNOSIS — Z794 Long term (current) use of insulin: Secondary | ICD-10-CM | POA: Diagnosis not present

## 2020-06-12 DIAGNOSIS — Z09 Encounter for follow-up examination after completed treatment for conditions other than malignant neoplasm: Secondary | ICD-10-CM | POA: Diagnosis not present

## 2020-06-12 LAB — POCT URINALYSIS DIP (CLINITEK)
Bilirubin, UA: NEGATIVE
Blood, UA: NEGATIVE
Glucose, UA: 500 mg/dL — AB
Ketones, POC UA: NEGATIVE mg/dL
Leukocytes, UA: NEGATIVE
Nitrite, UA: NEGATIVE
POC PROTEIN,UA: NEGATIVE
Spec Grav, UA: 1.015 (ref 1.010–1.025)
Urobilinogen, UA: 0.2 E.U./dL
pH, UA: 6.5 (ref 5.0–8.0)

## 2020-06-12 LAB — GLUCOSE, POCT (MANUAL RESULT ENTRY): POC Glucose: 383 mg/dl — AB (ref 70–99)

## 2020-06-12 MED ORDER — VANCOMYCIN 50 MG/ML ORAL SOLUTION: 125.0000 mg | Freq: Four times a day (QID) | ORAL | 0 refills | Status: AC

## 2020-06-12 NOTE — Progress Notes (Signed)
[facility]   Hospital Follow Up  Subjective:  Patient ID: Tony Long, male    DOB: 04/23/1976  Age: 44 y.o. MRN: [id number]  CC:  Chief Complaint  Patient presents with  . Hospitalization Follow-up    HPI Tony Long is a 44 year old male who presents for Hospital Follow Up today.     Patient Active Problem List   Diagnosis Date Noted  . Protein-calorie malnutrition, severe 05/29/2020  . Colitis due to Clostridioides difficile 05/28/2020  . Hypokalemia due to excessive gastrointestinal loss of potassium 05/28/2020  . Lactic acidosis 05/28/2020  . History of intravenous drug abuse (HCC) 05/28/2020  . Acute alcoholic pancreatitis 05/28/2020  . Diabetic polyneuropathy associated with type 2 diabetes mellitus (HCC) 05/28/2020  . Open wound of right thigh 05/28/2020  . Alcohol abuse 05/28/2020  . Pressure injury of skin 04/08/2020  . Abscess 04/07/2020  . DKA (diabetic ketoacidosis) (HCC) 04/06/2020  . Hemoglobin A1C greater than 9%, indicating poor diabetic control 08/09/2019  . History of delayed wound healing 08/09/2019  . IV drug user 08/09/2019  . Neuropathy 03/29/2019  . Wound of right foot 03/29/2019  . Essential hypertension 05/17/2018  . 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 Opioid use disorder, severe, dependence (HCC) 03/21/2018  . Osteomyelitis (HCC) 03/20/2018  . Chronic hepatitis C without hepatic coma (HCC) 07/02/2016   Current Status: Since his last office visit, he has has a Hospital Admission from 05/27/2020-05/30/2020 for Dehydration, Colitis r/t C-diff, Lactic Acidosis, etc. He is diabetic and has developed a new wound/sore on his left middle finger which began 2 weeks ago. Today, he is doing well with no complaints. He does not monitor his blood glucose levels regularly. He denies fatigue, frequent urination, blurred vision, excessive hunger, excessive thirst, weight gain, weight loss, and  poor wound healing. He continues to check his feet regularly. He denies visual changes, chest pain, cough, shortness of breath, heart palpitations, and falls. He has occasional headaches and dizziness with position changes. Denies severe headaches, confusion, seizures, double vision, and blurred vision, nausea and vomiting. He denies fevers, chills, recent infections, weight loss, and night sweats. Denies GI problems such as nausea, vomiting, diarrhea, and constipation. He has no reports of blood in stools, dysuria and hematuria. No depression or anxiety reported today. He is taking all medications as prescribed. He denies pain today.   Past Medical History:  Diagnosis Date  . Alcohol use 02/2020  . Clostridioides difficile diarrhea 04/2020  . Diabetes mellitus without complication (HCC)   . Elevated liver enzymes 02/2020  . Elevated liver enzymes 02/2020  . Hyperlipidemia 02/2020  . Hypertension   . IV drug user   . Vitamin D deficiency 02/2020  . Wound healing, delayed     Past Surgical History:  Procedure Laterality Date  . INCISION AND DRAINAGE ABSCESS N/A 03/22/2018   Procedure: INCISION AND DRAINAGE ABSCESS;  Surgeon: Violeta Gelinas, MD;  Location: [facility] OR;  Service: General;  Laterality: N/A;  . TEE WITHOUT CARDIOVERSION  03/22/2018   Procedure: TRANSESOPHAGEAL ECHOCARDIOGRAM (TEE);  Surgeon: Radiologist, Medication, MD;  Location: [facility] OR;  Service: Open Heart Surgery;;    Family History  Problem Relation Age of Onset  . Heart disease Father   . Diabetes Father   . Diabetes Paternal Uncle     Social History   Socioeconomic History  . Marital status: Single    Spouse name: Not on file  . Number of children:  Not on file  . Years of education: Not on file  . Highest education level: Not on file  Occupational History  . Not on file  Tobacco Use  . Smoking status: Current Every Day Smoker    Packs/day: 1.00  . Smokeless tobacco: Never Used  Vaping Use  . Vaping Use: Never  used  Substance and Sexual Activity  . Alcohol use: Yes    Comment: 1 fifth of liquor daily  . Drug use: Not Currently    Types: Cocaine    Comment: patient reports being abstinent since 03/2019  . Sexual activity: Yes    Partners: Female  Other Topics Concern  . Not on file  Social History Narrative  . Not on file   Social Determinants of Health   Financial Resource Strain: Not on file  Food Insecurity: Not on file  Transportation Needs: Not on file  Physical Activity: Not on file  Stress: Not on file  Social Connections: Not on file  Intimate Partner Violence: Not on file    Outpatient Medications Prior to Visit  Medication Sig Dispense Refill  . gabapentin (NEURONTIN) 300 MG capsule Take 2 capsules by mouth twice daily 120 capsule 0  . gabapentin (NEURONTIN) 300 MG capsule Take 2 capsule (600 mg= total) by mouth, 3 times a day. 120 capsule 6  . glucose blood test strip Use as instructed 100 each 12  . insulin glargine (LANTUS) 100 UNIT/ML Solostar Pen Inject 30 Units into the skin daily at 10 pm.    . insulin lispro (HUMALOG) 100 UNIT/ML injection Inject 0.1 mLs (10 Units total) into the skin 3 (three) times daily with meals. PER SLIDING SCALE    . Lancets (ONETOUCH ULTRASOFT) lancets Use as instructed 100 each 12  . empagliflozin (JARDIANCE) 10 MG TABS tablet Take 10 mg by mouth daily before breakfast. (Patient not taking: Reported on 06/12/2020) 30 tablet 3  . folic acid (FOLVITE) 1 MG tablet Take 1 tablet (1 mg total) by mouth daily. (Patient not taking: Reported on 06/12/2020) 30 tablet 0  . furosemide (LASIX) 20 MG tablet Take 1 tablet (20 mg total) by mouth 2 (two) times daily as needed. (Patient not taking: Reported on 06/12/2020) 60 tablet 1  . Multiple Vitamin (MULTIVITAMIN WITH MINERALS) TABS tablet Take 1 tablet by mouth daily. (Patient not taking: Reported on 06/12/2020)    . potassium chloride SA (KLOR-CON) 20 MEQ tablet Take 1 tablet (20 mEq total) by mouth daily  for 10 days. 10 tablet 0  . pravastatin (PRAVACHOL) 40 MG tablet Take 1 tablet (40 mg total) by mouth daily. (Patient not taking: Reported on 06/12/2020) 90 tablet 3  . thiamine 100 MG tablet Take 1 tablet (100 mg total) by mouth daily. (Patient not taking: Reported on 06/12/2020) 30 tablet 0  . Vitamin D, Ergocalciferol, (DRISDOL) 1.25 MG (50000 UNIT) CAPS capsule Take 1 capsule (50,000 Units total) by mouth every 7 (seven) days. (Patient not taking: Reported on 06/12/2020) 5 capsule 6   No facility-administered medications prior to visit.    No Known Allergies  ROS Review of Systems  Constitutional: Negative.   HENT: Negative.   Eyes: Negative.   Respiratory: Negative.   Cardiovascular: Negative.   Gastrointestinal: Negative.   Endocrine: Negative.   Genitourinary: Negative.   Musculoskeletal: Negative.   Skin: Positive for wound (left middle finger ).  Allergic/Immunologic: Negative.   Neurological: Positive for dizziness (occasional ) and headaches (occasional ).  Hematological: Negative.   Psychiatric/Behavioral: Negative.  Objective:    Physical Exam Vitals and nursing note reviewed.  Constitutional:      Appearance: Normal appearance.  HENT:     Head: Normocephalic and atraumatic.     Nose: Nose normal.     Mouth/Throat:     Mouth: Mucous membranes are moist.     Pharynx: Oropharynx is clear.  Cardiovascular:     Rate and Rhythm: Normal rate and regular rhythm.     Pulses: Normal pulses.     Heart sounds: Normal heart sounds.  Pulmonary:     Effort: Pulmonary effort is normal.     Breath sounds: Normal breath sounds.  Abdominal:     General: Bowel sounds are normal.     Palpations: Abdomen is soft.  Musculoskeletal:        General: Normal range of motion.     Cervical back: Normal range of motion and neck supple.  Skin:    General: Skin is warm and dry.     Comments: Left middle finger wound  Neurological:     General: No focal deficit present.      Mental Status: He is alert and oriented to person, place, and time.  Psychiatric:        Mood and Affect: Mood normal.        Behavior: Behavior normal.        Thought Content: Thought content normal.        Judgment: Judgment normal.     BP 102/68 (BP Location: Left Arm, Patient Position: Sitting, Cuff Size: Normal)   Pulse (!) 102   Temp 97.9 F (36.6 C) (Temporal)   Resp 18   Ht 5\' 11"  (1.803 m)   Wt 152 lb 9.6 oz (69.2 kg)   SpO2 99%   BMI 21.28 kg/m  Wt Readings from Last 3 Encounters:  06/12/20 152 lb 9.6 oz (69.2 kg)  05/29/20 153 lb 10.6 oz (69.7 kg)  04/17/20 173 lb (78.5 kg)     Health Maintenance Due  Topic Date Due  . PNEUMOCOCCAL POLYSACCHARIDE VACCINE AGE 41-64 HIGH RISK  Never done  . OPHTHALMOLOGY EXAM  Never done  . URINE MICROALBUMIN  06/02/2017  . FOOT EXAM  09/07/2019    There are no preventive care reminders to display for this patient.  Lab Results  Component Value Date   TSH 2.590 02/29/2020   Lab Results  Component Value Date   WBC 6.0 05/30/2020   HGB 9.7 (L) 05/30/2020   HCT 29.9 (L) 05/30/2020   MCV 106.8 (H) 05/30/2020   PLT 113 (L) 05/30/2020   Lab Results  Component Value Date   NA 134 (L) 05/30/2020   K 4.3 05/30/2020   CO2 28 05/30/2020   GLUCOSE 274 (H) 05/30/2020   BUN 8 05/30/2020   CREATININE 0.59 (L) 05/30/2020   BILITOT 0.9 05/30/2020   ALKPHOS 220 (H) 05/30/2020   AST 31 05/30/2020   ALT 20 05/30/2020   PROT 6.1 (L) 05/30/2020   ALBUMIN 2.6 (L) 05/30/2020   CALCIUM 8.2 (L) 05/30/2020   ANIONGAP 6 05/30/2020   Lab Results  Component Value Date   CHOL 227 (H) 02/29/2020   Lab Results  Component Value Date   HDL 165 02/29/2020   Lab Results  Component Value Date   LDLCALC 50 02/29/2020   Lab Results  Component Value Date   TRIG 70 02/29/2020   Lab Results  Component Value Date   CHOLHDL 1.4 02/29/2020   Lab Results  Component Value  Date   HGBA1C 8.8 (H) 05/28/2020    Assessment & Plan:    1. Hospital discharge follow-up - POCT URINALYSIS DIP (CLINITEK) - Comprehensive metabolic panel - CBC with Differential - vancomycin (VANCOCIN) 50 mg/mL oral solution; Take 2.5 mLs (125 mg total) by mouth every 6 (six) hours for 10 days.  Dispense: 100 mL; Refill: 0  2. C. difficile diarrhea Stable. Continue antibiotic as prescribed.  - vancomycin (VANCOCIN) 50 mg/mL oral solution; Take 2.5 mLs (125 mg total) by mouth every 6 (six) hours for 10 days.  Dispense: 100 mL; Refill: 0  3. Unspecified open wound of left middle finger without damage to nail, initial encounter - Ambulatory referral to Wound Clinic  4. Type 2 diabetes mellitus without complication, with long-term current use of insulin (HCC) He will continue medication as prescribed, to decrease foods/beverages high in sugars and carbs and follow Heart Healthy or DASH diet. Increase physical activity to at least 30 minutes cardio exercise daily.  - POCT URINALYSIS DIP (CLINITEK) - Comprehensive metabolic panel - CBC with Differential - Glucose (CBG)  5. Hypertension, unspecified type He will continue to take medications as prescribed, to decrease high sodium intake, excessive alcohol intake, increase potassium intake, smoking cessation, and increase physical activity of at least 30 minutes of cardio activity daily. He will continue to follow Heart Healthy or DASH diet. - Comprehensive metabolic panel - CBC with Differential  6. Follow up He will follow up in 3 months.   Meds ordered this encounter  Medications  . vancomycin (VANCOCIN) 50 mg/mL oral solution    Sig: Take 2.5 mLs (125 mg total) by mouth every 6 (six) hours for 10 days.    Dispense:  100 mL    Refill:  0    Orders Placed This Encounter  Procedures  . Comprehensive metabolic panel  . CBC with Differential  . Ambulatory referral to Wound Clinic  . POCT URINALYSIS DIP (CLINITEK)  . Glucose (CBG)     Referral Orders     Ambulatory referral to Wound  Clinic   Raliegh Ip, MSN, ANE, FNP-BC [facility] [address] [place], [place] [address] [phone]- fax   Problem List Items Addressed This Visit   None   Visit Diagnoses    Hospital discharge follow-up    -  Primary   Relevant Medications   vancomycin (VANCOCIN) 50 mg/mL oral solution   Other Relevant Orders   POCT URINALYSIS DIP (CLINITEK) (Completed)   Comprehensive metabolic panel   CBC with Differential   C. difficile diarrhea       Relevant Medications   vancomycin (VANCOCIN) 50 mg/mL oral solution   Unspecified open wound of left middle finger without damage to nail, initial encounter       Relevant Orders   Ambulatory referral to Wound Clinic   Type 2 diabetes mellitus without complication, with long-term current use of insulin (HCC)       Relevant Orders   POCT URINALYSIS DIP (CLINITEK) (Completed)   Comprehensive metabolic panel   CBC with Differential   Glucose (CBG) (Completed)   Hypertension, unspecified type       Relevant Orders   Comprehensive metabolic panel   CBC with Differential   Follow up          Meds ordered this encounter  Medications  . vancomycin (VANCOCIN) 50 mg/mL oral solution    Sig: Take 2.5 mLs (125 mg total)  by mouth every 6 (six) hours for 10 days.    Dispense:  100 mL    Refill:  0    Follow-up: No follow-ups on file.    [name], FNP

## 2020-06-12 NOTE — Progress Notes (Signed)
Completed 10 day course of vancomycin on Fri, 06/08/20, wants to discuss if should have refilled   Pt also has wound to L middle finger that he noticed few wks ago

## 2020-06-17 ENCOUNTER — Encounter: Payer: Self-pay | Admitting: Family Medicine

## 2020-06-27 ENCOUNTER — Encounter: Payer: Medicaid Other | Attending: Internal Medicine | Admitting: Internal Medicine

## 2020-06-27 ENCOUNTER — Other Ambulatory Visit: Payer: Self-pay

## 2020-06-27 DIAGNOSIS — T23201A Burn of second degree of right hand, unspecified site, initial encounter: Secondary | ICD-10-CM | POA: Diagnosis not present

## 2020-06-27 DIAGNOSIS — E1151 Type 2 diabetes mellitus with diabetic peripheral angiopathy without gangrene: Secondary | ICD-10-CM | POA: Insufficient documentation

## 2020-06-27 DIAGNOSIS — Z833 Family history of diabetes mellitus: Secondary | ICD-10-CM | POA: Diagnosis not present

## 2020-06-27 DIAGNOSIS — E1140 Type 2 diabetes mellitus with diabetic neuropathy, unspecified: Secondary | ICD-10-CM | POA: Insufficient documentation

## 2020-06-27 DIAGNOSIS — E11622 Type 2 diabetes mellitus with other skin ulcer: Secondary | ICD-10-CM | POA: Diagnosis not present

## 2020-06-27 DIAGNOSIS — Z8249 Family history of ischemic heart disease and other diseases of the circulatory system: Secondary | ICD-10-CM | POA: Insufficient documentation

## 2020-06-27 DIAGNOSIS — Z809 Family history of malignant neoplasm, unspecified: Secondary | ICD-10-CM | POA: Diagnosis not present

## 2020-06-27 DIAGNOSIS — L98499 Non-pressure chronic ulcer of skin of other sites with unspecified severity: Secondary | ICD-10-CM | POA: Diagnosis not present

## 2020-06-27 NOTE — Progress Notes (Signed)
Tony Long, Tony Long ([id number]) Visit Report for 06/27/2020 Abuse/Suicide Risk Screen Details Patient Name: Tony Long, Tony Long Date of Service: 06/27/2020 12:45 PM Medical Record Number: [id number] Patient Account Number: [id number] Date of Birth/Sex: 1975-12-27 (44 y.o. M) Treating RN: Rogers Blocker Primary Care Marty Uy: Raliegh Ip Other Clinician: Referring Truitt Cruey: Raliegh Ip Treating Jalon Squier/Extender: Altamese [place] in Treatment: 0 Abuse/Suicide Risk Screen Items Answer ABUSE RISK SCREEN: Has anyone close to you tried to hurt or harm you recentlyo No Do you feel uncomfortable with anyone in your familyo No Has anyone forced you do things that you didnot want to doo No Electronic Signature(s) Signed: 06/27/2020 4:45:04 PM By: Phillis Haggis, Dondra Prader RN Entered By: Phillis Haggis, Dondra Prader on 06/27/2020 13:04:12 Tony Long ([id number]) -------------------------------------------------------------------------------- Activities of Daily Living Details Patient Name: Tony Long Date of Service: 06/27/2020 12:45 PM Medical Record Number: [id number] Patient Account Number: [id number] Date of Birth/Sex: 20-Aug-1975 (44 y.o. M) Treating RN: Rogers Blocker Primary Care Lilly Gasser: Raliegh Ip Other Clinician: Referring Dazha Kempa: Raliegh Ip Treating Avram Danielson/Extender: Altamese [place] in Treatment: 0 Activities of Daily Living Items Answer Activities of Daily Living (Please select one for each item) Drive Automobile Completely Able Take Medications Completely Able Use Telephone Completely Able Care for Appearance Completely Able Use Toilet Completely Able Bath / Shower Completely Able Dress Self Completely Able Feed Self Completely Able Walk Completely Able Get In / Out Bed Completely Able Housework Completely Able Prepare Meals Completely Able Handle Money Completely Able Shop for Self Completely Able Electronic Signature(s) Signed:  06/27/2020 4:45:04 PM By: Phillis Haggis, Dondra Prader RN Entered By: Phillis Haggis, Dondra Prader on 06/27/2020 13:04:31 Tony Long ([id number]) -------------------------------------------------------------------------------- Education Screening Details Patient Name: Tony Long Date of Service: 06/27/2020 12:45 PM Medical Record Number: [id number] Patient Account Number: [id number] Date of Birth/Sex: 1975-12-30 (44 y.o. M) Treating RN: Rogers Blocker Primary Care Axcel Horsch: Raliegh Ip Other Clinician: Referring Ellora Varnum: Raliegh Ip Treating Analilia Geddis/Extender: Altamese [place] in Treatment: 0 Primary Learner Assessed: Patient Learning Preferences/Education Level/Primary Language Learning Preference: Explanation, Demonstration Highest Education Level: College or Above Preferred Language: English Cognitive Barrier Language Barrier: No Translator Needed: No Memory Deficit: No Emotional Barrier: No Cultural/Religious Beliefs Affecting Medical Care: No Physical Barrier Impaired Vision: No Impaired Hearing: No Decreased Hand dexterity: No Knowledge/Comprehension Knowledge Level: Medium Comprehension Level: Medium Ability to understand written instructions: Medium Ability to understand verbal instructions: Medium Motivation Anxiety Level: Calm Cooperation: Cooperative Education Importance: Acknowledges Need Interest in Health Problems: Asks Questions Perception: Coherent Willingness to Engage in Self-Management High Activities: Readiness to Engage in Self-Management High Activities: Electronic Signature(s) Signed: 06/27/2020 4:45:04 PM By: Phillis Haggis, Dondra Prader RN Entered By: Phillis Haggis, Dondra Prader on 06/27/2020 13:05:09 Tony Long ([id number]) -------------------------------------------------------------------------------- Fall Risk Assessment Details Patient Name: Tony Long Date of Service: 06/27/2020 12:45 PM Medical Record Number:  [id number] Patient Account Number: [id number] Date of Birth/Sex: 1975-10-31 (45 y.o. M) Treating RN: Rogers Blocker Primary Care Shatoria Stooksbury: Raliegh Ip Other Clinician: Referring Arleta Ostrum: Raliegh Ip Treating Adabelle Griffiths/Extender: Altamese [place] in Treatment: 0 Fall Risk Assessment Items Have you had 2 or more falls in the last 12 monthso 0 No Have you had any fall that resulted in injury in the last 12 monthso 0 No FALLS RISK SCREEN History of falling - immediate or within 3 months 0 No Secondary diagnosis (Do you have 2 or more medical diagnoseso) 0 No Ambulatory aid None/bed rest/wheelchair/nurse 0 Yes Crutches/cane/walker 0 No Furniture 0 No Intravenous therapy Access/Saline/Heparin Lock 0 No Gait/Transferring Normal/ bed rest/ wheelchair 0 Yes Weak (  short steps with or without shuffle, stooped but able to lift head while walking, may 0 No seek support from furniture) Impaired (short steps with shuffle, may have difficulty arising from chair, head down, impaired 0 No balance) Mental Status Oriented to own ability 0 Yes Electronic Signature(s) Signed: 06/27/2020 4:45:04 PM By: Phillis Haggis, Dondra Prader RN Entered By: Phillis Haggis, Dondra Prader on 06/27/2020 13:05:24 Tony Long ([id number]) -------------------------------------------------------------------------------- Foot Assessment Details Patient Name: Tony Long Date of Service: 06/27/2020 12:45 PM Medical Record Number: [id number] Patient Account Number: [id number] Date of Birth/Sex: 07-07-1975 (44 y.o. M) Treating RN: Rogers Blocker Primary Care Melayah Skorupski: Raliegh Ip Other Clinician: Referring Sofiya Ezelle: Raliegh Ip Treating Diamante Truszkowski/Extender: Altamese  in Treatment: 0 Foot Assessment Items Site Locations + = Sensation present, - = Sensation absent, C = Callus, U = Ulcer R = Redness, W = Warmth, M = Maceration, PU = Pre-ulcerative lesion F = Fissure, S = Swelling, D =  Dryness Assessment Right: Left: Other Deformity: No No Prior Foot Ulcer: No No Prior Amputation: No No Charcot Joint: No No Ambulatory Status: Ambulatory With Help Assistance Device: Cane Gait: Steady Electronic Signature(s) Signed: 06/27/2020 4:45:04 PM By: Phillis Haggis, Dondra Prader RN Entered By: Phillis Haggis, Dondra Prader on 06/27/2020 13:05:39 Tony Long ([id number]) -------------------------------------------------------------------------------- Nutrition Risk Screening Details Patient Name: Tony Long Date of Service: 06/27/2020 12:45 PM Medical Record Number: [id number] Patient Account Number: [id number] Date of Birth/Sex: 03-17-1976 (44 y.o. M) Treating RN: Rogers Blocker Primary Care Vernie Piet: Raliegh Ip Other Clinician: Referring Audry Kauzlarich: Raliegh Ip Treating Donnel Venuto/Extender: Maxwell Caul Weeks in Treatment: 0 Height (in): Weight (lbs): Body Mass Index (BMI): Nutrition Risk Screening Items Score Screening NUTRITION RISK SCREEN: I have an illness or condition that made me change the kind and/or amount of food I eat 0 No I eat fewer than two meals per day 0 No I eat few fruits and vegetables, or milk products 0 No I have three or more drinks of beer, liquor or wine almost every day 0 No I have tooth or mouth problems that make it hard for me to eat 0 No I don't always have enough money to buy the food I need 0 No I eat alone most of the time 0 No I take three or more different prescribed or over-the-counter drugs a day 0 No Without wanting to, I have lost or gained 10 pounds in the last six months 0 No I am not always physically able to shop, cook and/or feed myself 0 No Nutrition Protocols Good Risk Protocol 0 No interventions needed Moderate Risk Protocol High Risk Proctocol Risk Level: Good Risk Score: 0 Electronic Signature(s) Signed: 06/27/2020 4:45:04 PM By: Phillis Haggis, Dondra Prader RN Entered By: Phillis Haggis, Dondra Prader on 06/27/2020  13:05:30
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
## 2020-06-28 NOTE — Progress Notes (Signed)
ERHARD, SENSKE ([id number]) Visit Report for 06/27/2020 Chief Complaint Document Details Patient Name: Tony, Long Date of Service: 06/27/2020 12:45 PM Medical Record Number: [id number] Patient Account Number: [id number] Date of Birth/Sex: 01/01/1976 (44 y.o. M) Treating RN: Rodell Perna Primary Care Provider: Raliegh Ip Other Clinician: Referring Provider: Raliegh Ip Treating Provider/Extender: Altamese [place] in Treatment: 0 Information Obtained from: Patient Chief Complaint Right forearm and Left LE Ulcers 06/27/2020; patient is here for separate areas on both middle fingers Electronic Signature(s) Signed: 06/27/2020 6:56:58 PM By: Baltazar Najjar MD Entered By: Baltazar Najjar on 06/27/2020 13:29:00 Tony Long ([id number]) -------------------------------------------------------------------------------- Debridement Details Patient Name: Tony Long Date of Service: 06/27/2020 12:45 PM Medical Record Number: [id number] Patient Account Number: [id number] Date of Birth/Sex: 30-Aug-1975 (44 y.o. M) Treating RN: Rodell Perna Primary Care Provider: Raliegh Ip Other Clinician: Referring Provider: Raliegh Ip Treating Provider/Extender: Altamese [place] in Treatment: 0 Debridement Performed for Wound #15 Right Hand - 3rd Digit Assessment: Performed By: Physician Maxwell Caul, MD Debridement Type: Debridement Level of Consciousness (Pre- Awake and Alert procedure): Pre-procedure Verification/Time Out Yes - 13:15 Taken: Start Time: 13:15 Total Area Debrided (L x W): 2 (cm) x 1.5 (cm) = 3 (cm) Tissue and other material Viable, Non-Viable, Subcutaneous, Skin: Dermis , Skin: Epidermis debrided: Level: Skin/Subcutaneous Tissue Debridement Description: Excisional Instrument: Curette Bleeding: Minimum Hemostasis Achieved: Pressure Procedural Pain: 2 Post Procedural Pain: 0 Response to Treatment: Procedure was tolerated  well Level of Consciousness (Post- Awake and Alert procedure): Post Debridement Measurements of Total Wound Length: (cm) 2 Width: (cm) 1.5 Depth: (cm) 0.2 Volume: (cm) 0.471 Character of Wound/Ulcer Post Debridement: Improved Post Procedure Diagnosis Same as Pre-procedure Electronic Signature(s) Signed: 06/27/2020 6:56:58 PM By: Baltazar Najjar MD Signed: 06/28/2020 11:28:50 AM By: Rodell Perna Entered By: Baltazar Najjar on 06/27/2020 13:28:18 Tony Long ([id number]) -------------------------------------------------------------------------------- HPI Details Patient Name: Tony Long Date of Service: 06/27/2020 12:45 PM Medical Record Number: [id number] Patient Account Number: [id number] Date of Birth/Sex: 07/25/1975 (44 y.o. M) Treating RN: Rodell Perna Primary Care Provider: Raliegh Ip Other Clinician: Referring Provider: Raliegh Ip Treating Provider/Extender: Altamese [place] in Treatment: 0 History of Present Illness Associated Signs and Symptoms: Patient does have a history of chronic viral hepatitis see, hypertension, and opioid dependence. HPI Description: 05/31/18 on evaluation today patient presents for initial evaluation or office for two issues that he's been having with openings in the lumbar spine extending into the lower thoracic region. Subsequently it appears that as best I can tell from his notes he likely had an epidural abscess which subsequently led to these open areas. He was seen by infectious disease, Rexene Alberts, who referred him to [place] for management of the wounds. Apparently he has completed his course of IV antibiotic therapy at this point. Patient did have a technically limited examination of the cervical and thoracic spine by way of it and MRI. Unfortunately he was not able to tolerate the full length of the exam therefore only sagittal sections were obtained of the thoracic spine region. Fortunately there is no imaging  findings to suggest spinal cord infarction. There was interval improvement in the epidural collection (epidural abscess) on this examination when compared to the prior examination which was 03/22/18. The date of this MRI was 03/30/18. Again there was no imaging performed of the lumbar spine at this time. No new disc guidance or facet arthritis noted. There is no evidence of osteomyelitis. Currently the patient does not have any significant pain which is good news.  With that being said he is having some discomfort at this point mainly with packing of the wounds although I think packing is going to continue to be the treatment of choice. No fevers, chills, nausea, or vomiting noted at this time. 06/07/18 on evaluation today patient's wounds in the back region specifically the lumbar spine area appear to be doing very well at this time. In fact the 12 o'clock total of the cephalad wound shows evidence of not being quite as wide open as what I previously noted. In fact there is gonna be no chance of packing anything into this area at this time. The caudal wound actually shows signs of being about the same although again as I explained to the patient is gonna take some time for this area to heal. Readmission: 09/20/18 patient seen today for readmission concerning issues that he has been having with his back I previously saw him for this towards the end of last year 2019. Subsequently he was lost to follow-up due to not returning to the clinic. Subsequently what brings them in today is actually a different issue with his right lower quadrant abdominal area/groin where he has what appears to be an abscess at this time that for the most part is resolving but he actually has eschar covering the surface of the wound currently. With that being said he states this has been painful although it's less painful now than it's been in the past. He did attempt to use a razor blade to actually remove some of this dark eschar  prior to coming into the office today. With that being said he only did a very small portion and states he got too scared to continue. Again I believe it's best for him not to do this but rather allow [place] to do this if need be. Nonetheless I do think the escort coming off of benefit him he completely agrees. Fortunately there's no signs of active infection at this time. He does have some integration noted around the wound region. No fevers, chills, nausea, or vomiting noted at this time. Patient tells me that he currently is not utilizing any IV recreational drugs at this point he states he's doing very well in that regard. Readmission: 01/06/19 patient presents today for reevaluation in our clinic. I previously seen him four wounds on his back region in particular. Nonetheless he actually has wounds of this time in regard to his bilateral lower extremities which unfortunately have been giving him some issues for several weeks. Fortunately there's no signs of significant infection although there does seem to be some irritation. He states this will start out as red patches that become somewhat tender and then open up into ulcers. He has not gotten any answers as to what may be causing this. He does have a history of opioid abuse but tells me he has not used the recreational drugs in the past nine months. He does have hypertension in multiple lower extremity leg ulcers and varying degrees of severity and tissue exposure noted of the bilateral lower extremities. He also has diabetes. There is no history of trauma he does not know where these came from. Readmission: 08/15/2019 patient presents today for reevaluation here in the clinic concerning issues that he has been having with a wound on his left lower extremity as well as his right forearm. Fortunately neither appears to be extremely severe which is good news the left leg area is a region that he has had multiple times in the  past that we have been  helping take care of him. Again this seems to be more due to scar tissue and breakdown he does not really have significant edema but I still wonder if this could be more venous in nature potentially just due to the recurrence. Nonetheless he tells me that the forearm happened while he was working on a car that has not been running 30 years. There is nothing hot I suspect a chemical burn to this area where he might of gotten something on and that he did not realize that caused an issue here. This is very superficial. 08/22/2019 upon evaluation today patient appears to be doing well in general in regard to his arm ulcer as well as his proximal lower leg ulcer. Both are healing quite nicely and the arm in fact appears to be completely healed. Unfortunately the wound around his left medial lower leg distally is not doing as well it does not look terrible but at the same time there is tendon exposed is definitely a little bit deeper. He finds it strange that this is something that healed about a year ago and it was in the exact same location and pretty much looked identical to him. And then reopen. I am beginning to be somewhat suspicious about the possibility of a skin cancer. Obviously if it is there is something that we need to do with that sooner rather than later to not let this get out of control. Once I mention this the patient became somewhat reluctant to want to proceed with the biopsy. He states in general "I am the kind of person that would prefer not to know if it is cancer". I explained further that again this is something that if it is a skin cancer can be taken care of now if you wait for to spread it can be much more significant and even life-threatening. 08/29/2019 upon evaluation today patient appears to be doing a little better in regard to his wound at this point which is good news on the leg. I feel like he shown some signs of improvement. We have discussed the possibility of biopsy for the  site if things do not seem to be getting better but right now I do see a little bit of improvement compared to where we were previous. Fortunately there is no signs of active infection which is good news. He still wants to hold off on the biopsy I do not see anything so concerning that I think we need to rush down that road though I think we may Want to at least consider this in the future if things stall out. MESHILEM, MACHUCA ([id number]) 09/12/2019 upon evaluation today patient appears to be doing really about the same with regard to his wound is measuring a little better but tracking in the right direction one of his wounds is healed the other is measuring smaller in general the biggest issue that I see at this point is that he does need to have some kind of dressing to try to help clean away some of the slough that continues to build up. He states the Santyl seems to do a lot better for him than the collagen did. I think we can switch back to the Santyl. READMISSION 06/27/2020 This patient is now a 44 year old type II diabetic on insulin. We have had him in the clinic on several different occasions largely result of abscesses including on his lumbar epidural area in 2019, right lower quadrant abscess in 2020  and then earlier this year in February and March with areas on his left lower extremity medially right forearm. I do not know that he is ever really healed out he just seems to be lost to follow-up or not follow-up with [place]. I note that he was in hospital from 05/27/2020 through 05/30/2020 with diabetic ketoacidosis, severe pseudomembranous colitis and alcohol induced pancreatitis at that point he was noted to have a thigh ulcer but that seems to have healed up. The history he gives here is a bit vague he says that he noted about a week and 1/2 to 2 weeks ago a blister on his right lateral cuticle on the third finger. Also an area on the tip of his left third finger. He thinks these may have  been burn injuries while he is cooking although he does not specifically remember a particular incident. He simply came in with Band-Aids on these areas. He has severe diabetic neuropathy including his feet and his hands. Does not have any known peripheral arterial disease Past medical history includes diabetes with peripheral neuropathy neuropathy, history of opioid abuse, history of endocarditis, soft tissue infection in his anterior chest, history of recurrent abscesses. He is a smoker I did not go over his recreational drug history today. Electronic Signature(s) Signed: 06/27/2020 6:56:58 PM By: Baltazar Najjar MD Entered By: Baltazar Najjar on 06/27/2020 13:33:23 Tony Long ([id number]) -------------------------------------------------------------------------------- Physical Exam Details Patient Name: Tony Long Date of Service: 06/27/2020 12:45 PM Medical Record Number: [id number] Patient Account Number: [id number] Date of Birth/Sex: [birth year] (44 y.o. M) Treating RN: Rodell Perna Primary Care Provider: Raliegh Ip Other Clinician: Referring Provider: Raliegh Ip Treating Provider/Extender: Maxwell Caul [place] in Treatment: 0 Constitutional Sitting or standing Blood Pressure is within target range for patient.. Pulse regular and within target range for patient.Marland Kitchen Respirations regular, non- labored and within target range.. Temperature is normal and within the target range for the patient.Marland Kitchen appears in no distress. Integumentary (Hair, Skin) Multiple healed skin areas were seen. Notes Wound exam; oOn the lateral cuticle of the third finger now there was a necrotic area. Some swelling across the dorsal aspect of the nail as well. This had the appearance of a chronic paronychia a. I used a #3 curette to remove the eschar and subcutaneous debris thinking I might find purulence I did not find any. The nail on this side is also unstable. I am not exactly sure what  could have done all of this. oOn the tip of his left third finger there is a hard nodule probably the remanence of a blood blister. I was going to remove this but he would not allow it oRadial and brachial pulses on both sides are easily palpable. This is not an ischemic issue Electronic Signature(s) Signed: 06/27/2020 6:56:58 PM By: Baltazar Najjar MD Entered By: Baltazar Najjar on 06/27/2020 13:47:39 Tony Long ([id number]) -------------------------------------------------------------------------------- Physician Orders Details Patient Name: Tony Long Date of Service: 06/27/2020 12:45 PM Medical Record Number: [id number] Patient Account Number: [id number] Date of Birth/Sex: January 25, 1976 (44 y.o. M) Treating RN: Rogers Blocker Primary Care Provider: Raliegh Ip Other Clinician: Referring Provider: Raliegh Ip Treating Provider/Extender: Altamese [place] in Treatment: 0 Verbal / Phone Orders: No Diagnosis Coding Follow-up Appointments o Return Appointment in 1 week. Wound Cleansing/Bathing/Shower/Hygiene o May shower without dressing. Gently cleanse wound with antibacterial soap, rinse and pat dry prior to dressing wounds Notes neosporin with bandaid or gauze Electronic Signature(s) Signed: 06/27/2020 4:45:04 PM By: Lajean Manes RN Signed: 06/27/2020 6:56:58  PM By: Baltazar Najjar MD Entered By: Phillis Haggis, Dondra Prader on 06/27/2020 13:21:55 Tony Long ([id number]) -------------------------------------------------------------------------------- Problem List Details Patient Name: Tony Long Date of Service: 06/27/2020 12:45 PM Medical Record Number: [id number] Patient Account Number: [id number] Date of Birth/Sex: [birth year] (44 y.o. M) Treating RN: Rodell Perna Primary Care Provider: Raliegh Ip Other Clinician: Referring Provider: Raliegh Ip Treating Provider/Extender: Altamese [place] in Treatment: 0 Active  Problems ICD-10 Encounter Code Description Active Date MDM Diagnosis S60.131D Contusion of right middle finger with damage to nail, subsequent 06/27/2020 No Yes encounter S60.032S Contusion of left middle finger without damage to nail, sequela 06/27/2020 No Yes E11.622 Type 2 diabetes mellitus with other skin ulcer 06/27/2020 No Yes Inactive Problems Resolved Problems Electronic Signature(s) Signed: 06/27/2020 6:56:58 PM By: Baltazar Najjar MD Entered By: Baltazar Najjar on 06/27/2020 13:27:59 Tony Long ([id number]) -------------------------------------------------------------------------------- Progress Note Details Patient Name: Tony Long Date of Service: 06/27/2020 12:45 PM Medical Record Number: [id number] Patient Account Number: [id number] Date of Birth/Sex: [birth year] (44 y.o. M) Treating RN: Rodell Perna Primary Care Provider: Raliegh Ip Other Clinician: Referring Provider: Raliegh Ip Treating Provider/Extender: Altamese [place] in Treatment: 0 Subjective Chief Complaint Information obtained from Patient Right forearm and Left LE Ulcers 06/27/2020; patient is here for separate areas on both middle fingers History of Present Illness (HPI) The following HPI elements were documented for the patient's wound: Associated Signs and Symptoms: Patient does have a history of chronic viral hepatitis see, hypertension, and opioid dependence. 05/31/18 on evaluation today patient presents for initial evaluation or office for two issues that he's been having with openings in the lumbar spine extending into the lower thoracic region. Subsequently it appears that as best I can tell from his notes he likely had an epidural abscess which subsequently led to these open areas. He was seen by infectious disease, Rexene Alberts, who referred him to [place] for management of the wounds. Apparently he has completed his course of IV antibiotic therapy at this point. Patient  did have a technically limited examination of the cervical and thoracic spine by way of it and MRI. Unfortunately he was not able to tolerate the full length of the exam therefore only sagittal sections were obtained of the thoracic spine region. Fortunately there is no imaging findings to suggest spinal cord infarction. There was interval improvement in the epidural collection (epidural abscess) on this examination when compared to the prior examination which was 03/22/18. The date of this MRI was 03/30/18. Again there was no imaging performed of the lumbar spine at this time. No new disc guidance or facet arthritis noted. There is no evidence of osteomyelitis. Currently the patient does not have any significant pain which is good news. With that being said he is having some discomfort at this point mainly with packing of the wounds although I think packing is going to continue to be the treatment of choice. No fevers, chills, nausea, or vomiting noted at this time. 06/07/18 on evaluation today patient's wounds in the back region specifically the lumbar spine area appear to be doing very well at this time. In fact the 12 o'clock total of the cephalad wound shows evidence of not being quite as wide open as what I previously noted. In fact there is gonna be no chance of packing anything into this area at this time. The caudal wound actually shows signs of being about the same although again as I explained to the patient is gonna take some time for this area to  heal. Readmission: 09/20/18 patient seen today for readmission concerning issues that he has been having with his back I previously saw him for this towards the end of last year 2019. Subsequently he was lost to follow-up due to not returning to the clinic. Subsequently what brings them in today is actually a different issue with his right lower quadrant abdominal area/groin where he has what appears to be an abscess at this time that for the most  part is resolving but he actually has eschar covering the surface of the wound currently. With that being said he states this has been painful although it's less painful now than it's been in the past. He did attempt to use a razor blade to actually remove some of this dark eschar prior to coming into the office today. With that being said he only did a very small portion and states he got too scared to continue. Again I believe it's best for him not to do this but rather allow [place] to do this if need be. Nonetheless I do think the escort coming off of benefit him he completely agrees. Fortunately there's no signs of active infection at this time. He does have some integration noted around the wound region. No fevers, chills, nausea, or vomiting noted at this time. Patient tells me that he currently is not utilizing any IV recreational drugs at this point he states he's doing very well in that regard. Readmission: 01/06/19 patient presents today for reevaluation in our clinic. I previously seen him four wounds on his back region in particular. Nonetheless he actually has wounds of this time in regard to his bilateral lower extremities which unfortunately have been giving him some issues for several weeks. Fortunately there's no signs of significant infection although there does seem to be some irritation. He states this will start out as red patches that become somewhat tender and then open up into ulcers. He has not gotten any answers as to what may be causing this. He does have a history of opioid abuse but tells me he has not used the recreational drugs in the past nine months. He does have hypertension in multiple lower extremity leg ulcers and varying degrees of severity and tissue exposure noted of the bilateral lower extremities. He also has diabetes. There is no history of trauma he does not know where these came from. Readmission: 08/15/2019 patient presents today for reevaluation here in the  clinic concerning issues that he has been having with a wound on his left lower extremity as well as his right forearm. Fortunately neither appears to be extremely severe which is good news the left leg area is a region that he has had multiple times in the past that we have been helping take care of him. Again this seems to be more due to scar tissue and breakdown he does not really have significant edema but I still wonder if this could be more venous in nature potentially just due to the recurrence. Nonetheless he tells me that the forearm happened while he was working on a car that has not been running 30 years. There is nothing hot I suspect a chemical burn to this area where he might of gotten something on and that he did not realize that caused an issue here. This is very superficial. 08/22/2019 upon evaluation today patient appears to be doing well in general in regard to his arm ulcer as well as his proximal lower leg ulcer. Both are healing quite nicely  and the arm in fact appears to be completely healed. Unfortunately the wound around his left medial lower leg distally is not doing as well it does not look terrible but at the same time there is tendon exposed is definitely a little bit deeper. He finds it strange that this is something that healed about a year ago and it was in the exact same location and pretty much looked identical to him. And then reopen. I am beginning to be somewhat suspicious about the possibility of a skin cancer. Obviously if it is there is something that we need to do with that sooner rather than later to not let this get out of control. Once I mention this the patient became somewhat reluctant to want to proceed with the biopsy. He states in general "I am the kind of person that would prefer not to know if it is cancer". I explained further that again this is something that if it is a skin cancer can be taken care of now if you wait for to spread it can be much more  significant and even life-threatening. AMEIR, FARIA ([id number]) 08/29/2019 upon evaluation today patient appears to be doing a little better in regard to his wound at this point which is good news on the leg. I feel like he shown some signs of improvement. We have discussed the possibility of biopsy for the site if things do not seem to be getting better but right now I do see a little bit of improvement compared to where we were previous. Fortunately there is no signs of active infection which is good news. He still wants to hold off on the biopsy I do not see anything so concerning that I think we need to rush down that road though I think we may Want to at least consider this in the future if things stall out. 09/12/2019 upon evaluation today patient appears to be doing really about the same with regard to his wound is measuring a little better but tracking in the right direction one of his wounds is healed the other is measuring smaller in general the biggest issue that I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

## 2020-07-04 ENCOUNTER — Ambulatory Visit: Payer: Medicaid Other | Admitting: Internal Medicine

## 2020-07-24 ENCOUNTER — Other Ambulatory Visit: Payer: Medicaid Other

## 2020-07-24 ENCOUNTER — Other Ambulatory Visit: Payer: Self-pay

## 2020-07-24 ENCOUNTER — Encounter: Payer: Self-pay | Admitting: Gastroenterology

## 2020-07-24 ENCOUNTER — Ambulatory Visit (INDEPENDENT_AMBULATORY_CARE_PROVIDER_SITE_OTHER): Payer: Medicaid Other | Admitting: Gastroenterology

## 2020-07-24 VITALS — BP 160/92 | HR 94 | Ht 71.0 in | Wt 151.0 lb

## 2020-07-24 DIAGNOSIS — A0472 Enterocolitis due to Clostridium difficile, not specified as recurrent: Secondary | ICD-10-CM

## 2020-07-24 MED ORDER — DIFICID 200 MG PO TABS: 200.0000 mg | ORAL_TABLET | Freq: Two times a day (BID) | ORAL | 0 refills | Status: DC

## 2020-07-24 NOTE — Patient Instructions (Addendum)
If you are age 45 or older, your body mass index should be between 23-30. Your Body mass index is 21.06 kg/m. If this is out of the aforementioned range listed, please consider follow up with your Primary Care Provider.  If you are age 35 or younger, your body mass index should be between 19-25. Your Body mass index is 21.06 kg/m. If this is out of the aformentioned range listed, please consider follow up with your Primary Care Provider.   We have sent the following medications to your pharmacy for you to pick up at your convenience: Dificid 200 mg twice daily for 10 days   Start Florastar probiotic twice daily.   Your provider has requested that you go to the basement level for lab work before leaving today. Press "B" on the elevator. The lab is located at the first door on the left as you exit the elevator.  Due to recent changes in healthcare laws, you may see the results of your imaging and laboratory studies on MyChart before your provider has had a chance to review them.  We understand that in some cases there may be results that are confusing or concerning to you. Not all laboratory results come back in the same time frame and the provider may be waiting for multiple results in order to interpret others.  Please give [place] 48 hours in order for your provider to thoroughly review all the results before contacting the office for clarification of your results.   Thank you for choosing me and [facility].  Doug Sou, PA-C

## 2020-07-24 NOTE — Progress Notes (Addendum)
07/24/2020 Tony Long [id number] 1975/08/17   HISTORY OF PRESENT ILLNESS: This is a 45 year old male who is new to our office.  He is here today as a self-referral for complaints of diarrhea and C. difficile.  He has past medical history of alcohol abuse, diabetes complicated by DKA, hyperlipidemia, hypertension, history of IV drug abuse with chronic hep C for which he is followed with ID.  He had been treated in October for anterior wall infection using IV vancomycin followed by Augmentin and doxycycline.  Following that he developed severe diarrhea.  C. difficile was positive in November.  It looks like he was initially treated with Flagyl, but then was switched to vancomycin, which he tells me that he has taken a total of 2 courses of vancomycin between 10 and 14 days each.  He still continues with profuse watery diarrhea.  He says every time that he eats he has diarrhea.  He has diarrhea with incontinence during the night.  He denies any abdominal pain or rectal bleeding.  He says that his appetite is good and he denies weight loss.  He is having to wear depends especially at bedtime due to the incontinence.   Past Medical History:  Diagnosis Date  . Alcohol use 02/2020  . Clostridioides difficile diarrhea 04/2020  . Diabetes mellitus without complication (HCC)   . Elevated liver enzymes 02/2020  . Elevated liver enzymes 02/2020  . Hyperlipidemia 02/2020  . Hypertension   . IV drug user   . Vitamin D deficiency 02/2020  . Wound healing, delayed    Past Surgical History:  Procedure Laterality Date  . INCISION AND DRAINAGE ABSCESS N/A 03/22/2018   Procedure: INCISION AND DRAINAGE ABSCESS;  Surgeon: Violeta Gelinas, MD;  Location: [facility] OR;  Service: General;  Laterality: N/A;  . TEE WITHOUT CARDIOVERSION  03/22/2018   Procedure: TRANSESOPHAGEAL ECHOCARDIOGRAM (TEE);  Surgeon: Radiologist, Medication, MD;  Location: [facility] OR;  Service: Open Heart Surgery;;    reports that he has been  smoking. He has been smoking about 1.00 pack per day. He has never used smokeless tobacco. He reports current alcohol use. He reports previous drug use. Drug: Cocaine. family history includes Diabetes in his father and paternal uncle; Heart disease in his father. No Known Allergies    Outpatient Encounter Medications as of 07/24/2020  Medication Sig  . gabapentin (NEURONTIN) 300 MG capsule Take 2 capsule (600 mg= total) by mouth, 3 times a day.  Marland Kitchen glucose blood test strip Use as instructed  . insulin glargine (LANTUS) 100 UNIT/ML Solostar Pen Inject 30 Units into the skin daily at 10 pm.  . insulin lispro (HUMALOG) 100 UNIT/ML injection Inject 0.1 mLs (10 Units total) into the skin 3 (three) times daily with meals. PER SLIDING SCALE  . Lancets (ONETOUCH ULTRASOFT) lancets Use as instructed  . empagliflozin (JARDIANCE) 10 MG TABS tablet Take 10 mg by mouth daily before breakfast. (Patient not taking: No sig reported)  . folic acid (FOLVITE) 1 MG tablet Take 1 tablet (1 mg total) by mouth daily. (Patient not taking: No sig reported)  . furosemide (LASIX) 20 MG tablet Take 1 tablet (20 mg total) by mouth 2 (two) times daily as needed. (Patient not taking: No sig reported)  . Multiple Vitamin (MULTIVITAMIN WITH MINERALS) TABS tablet Take 1 tablet by mouth daily. (Patient not taking: No sig reported)  . potassium chloride SA (KLOR-CON) 20 MEQ tablet Take 1 tablet (20 mEq total) by mouth daily for 10 days.  Marland Kitchen  pravastatin (PRAVACHOL) 40 MG tablet Take 1 tablet (40 mg total) by mouth daily. (Patient not taking: No sig reported)  . thiamine 100 MG tablet Take 1 tablet (100 mg total) by mouth daily. (Patient not taking: No sig reported)  . Vitamin D, Ergocalciferol, (DRISDOL) 1.25 MG (50000 UNIT) CAPS capsule Take 1 capsule (50,000 Units total) by mouth every 7 (seven) days. (Patient not taking: No sig reported)  . [DISCONTINUED] gabapentin (NEURONTIN) 300 MG capsule Take 2 capsules by mouth twice daily    No facility-administered encounter medications on file as of 07/24/2020.    REVIEW OF SYSTEMS  : All other systems reviewed and negative except where noted in the History of Present Illness.   PHYSICAL EXAM: BP (!) 160/92   Pulse 94   Ht 5\' 11"  (1.803 m)   Wt 151 lb (68.5 kg)   BMI 21.06 kg/m  General: Well developed white male in no acute distress Head: Normocephalic and atraumatic Eyes:  Sclerae anicteric, conjunctiva pink. Ears: Normal auditory acuity Lungs: Clear throughout to auscultation; no W/R/R. Heart: Regular rate and rhythm; no M/R/G. Abdomen: Soft, non-distended.  BS present and somewhat hyperactive.  Non-tender. Musculoskeletal: Symmetrical with no gross deformities  Skin: No lesions on visible extremities Extremities: No edema  Neurological: Alert oriented x 4, grossly non-focal Psychological:  Alert and cooperative. Normal mood and affect  ASSESSMENT AND PLAN: *Cdiff diarrhea: Diagnosed initially in November and has been treated with course of Flagyl and two 10 to 4-day courses of vancomycin.  Continues to have profuse diarrhea.  No rectal bleeding, no abdominal pain, no weight loss.  I am going to repeat a C. difficile PCR today since the last was in November although anticipate probably will be positive.  I am going to try to get Dificid first 200 mg twice daily for 10 days, but if not covered then he will need a vancomycin taper (he prefers pill form).  Prescription sent to pharmacy.  May want to consider adding a dose of Zinplava as well.  He will begin Florastor probiotic twice daily as well   CC:  December, FNP

## 2020-07-24 NOTE — Progress Notes (Signed)
Addendum: Reviewed and agree with assessment and management plan. Agree with plan to use Dificid now. Would refer also to [name] for consideration of Zinplava Alayshia Marini, Carie Caddy, MD

## 2020-07-26 ENCOUNTER — Telehealth: Payer: Self-pay | Admitting: Gastroenterology

## 2020-07-26 NOTE — Telephone Encounter (Signed)
Pt is requesting a call back from a nurse in regards to his DIFICID, pt states the pharmacy needs more information on this medication, pt does not know what information is needed.

## 2020-07-27 NOTE — Telephone Encounter (Signed)
Called patient back and let him know that Dificid was approved.

## 2020-08-03 DIAGNOSIS — H5213 Myopia, bilateral: Secondary | ICD-10-CM | POA: Diagnosis not present

## 2020-08-13 ENCOUNTER — Other Ambulatory Visit: Payer: Self-pay | Admitting: *Deleted

## 2020-08-13 NOTE — Patient Outreach (Signed)
Care Coordination  08/13/2020  [name] 10/29/1975 [id number]    Medicaid Managed Care   Unsuccessful Outreach Note  08/13/2020 Name: Tony Long MRN: [id number] DOB: 1976-01-28  Referred by: [name], FNP Reason for referral : High Risk Managed Medicaid (Unsuccessful RNCM initial outreach)   An unsuccessful telephone outreach was attempted today. The patient was referred to the case management team for assistance with care management and care coordination.   Follow Up Plan: The patient has been provided with contact information for the care management team and has been advised to call with any health related questions or concerns.  The care management team will reach out to the patient again over the next 7-14 days.   Estanislado Emms RN, BSN [place]  [employer]

## 2020-08-13 NOTE — Patient Instructions (Signed)
Visit Information  Mr. Sael Furches  - as a part of your Medicaid benefit, you are eligible for care management and care coordination services at no cost or copay. I was unable to reach you by phone today but would be happy to help you with your health related needs. Please feel free to call me @ [phone].   A member of the Managed Medicaid care management team will reach out to you again over the next 7-14 days.   Estanislado Emms RN, BSN [place]  [employer]

## 2020-08-21 ENCOUNTER — Telehealth: Payer: Self-pay | Admitting: Family Medicine

## 2020-08-21 NOTE — Telephone Encounter (Signed)
I spoke with Tony Long today to offer the services of the Managed Medicaid team. He declined our services at this time.

## 2020-08-28 ENCOUNTER — Ambulatory Visit: Payer: Self-pay | Admitting: Family Medicine

## 2020-09-12 ENCOUNTER — Other Ambulatory Visit: Payer: Medicaid Other

## 2020-09-12 ENCOUNTER — Encounter: Payer: Self-pay | Admitting: Gastroenterology

## 2020-09-12 ENCOUNTER — Ambulatory Visit: Payer: Medicaid Other | Admitting: Gastroenterology

## 2020-09-12 VITALS — BP 102/62 | HR 105 | Ht 71.0 in | Wt 144.6 lb

## 2020-09-12 DIAGNOSIS — A0472 Enterocolitis due to Clostridium difficile, not specified as recurrent: Secondary | ICD-10-CM | POA: Diagnosis not present

## 2020-09-12 DIAGNOSIS — Z09 Encounter for follow-up examination after completed treatment for conditions other than malignant neoplasm: Secondary | ICD-10-CM | POA: Diagnosis not present

## 2020-09-12 MED ORDER — VANCOMYCIN HCL 125 MG PO CAPS: ORAL_CAPSULE | ORAL | 0 refills | Status: AC

## 2020-09-12 NOTE — Progress Notes (Signed)
09/12/2020 Tony Long [id number] 1975/10/10   HISTORY OF PRESENT ILLNESS: This is a 45 year old male who was seen by me back in January for complaints of diarrhea and C. difficile.  He has past medical history of alcohol abuse, diabetes complicated by DKA, hyperlipidemia, hypertension, history of IV drug abuse with chronic hep C for which he follows with ID.  In October he was treated for anterior wall infection using IV vancomycin followed by Augmentin and doxycycline.  Following that he developed severe diarrhea.  C. difficile was positive in November.  He was initially treated with Flagyl, but that was switched to vancomycin.  He took 2 courses of vancomycin each between 10 and 14 days.  When I saw him we treated him with a course of Dificid.  He tells me that he really never had much improvement at all with the Dificid.  He has continued to have diarrhea.  He says that he has had it and is about ready to let us cut his guts out.  He is so tired of the diarrhea.  He has continued to lose weight.  No abdominal pain or rectal bleeding.  He is on Florastor probiotics.   Past Medical History:  Diagnosis Date  . Alcohol use 02/2020  . Clostridioides difficile diarrhea 04/2020  . Diabetes mellitus without complication (HCC)   . Elevated liver enzymes 02/2020  . Elevated liver enzymes 02/2020  . Hyperlipidemia 02/2020  . Hypertension   . IV drug user   . Vitamin D deficiency 02/2020  . Wound healing, delayed    Past Surgical History:  Procedure Laterality Date  . INCISION AND DRAINAGE ABSCESS N/A 03/22/2018   Procedure: INCISION AND DRAINAGE ABSCESS;  Surgeon: Violeta Gelinas, MD;  Location: [facility] OR;  Service: General;  Laterality: N/A;  . TEE WITHOUT CARDIOVERSION  03/22/2018   Procedure: TRANSESOPHAGEAL ECHOCARDIOGRAM (TEE);  Surgeon: Radiologist, Medication, MD;  Location: [facility] OR;  Service: Open Heart Surgery;;    reports that he has been smoking cigarettes. He has been smoking about  1.00 pack per day. He has never used smokeless tobacco. He reports current alcohol use. He reports previous drug use. Drug: Cocaine. family history includes Diabetes in his father and paternal uncle; Esophageal cancer in his maternal grandmother; Heart disease in his father. No Known Allergies    Outpatient Encounter Medications as of 09/12/2020  Medication Sig  . gabapentin (NEURONTIN) 300 MG capsule Take 2 capsule (600 mg= total) by mouth, 3 times a day.  Marland Kitchen glucose blood test strip Use as instructed  . insulin glargine (LANTUS) 100 UNIT/ML Solostar Pen Inject 30 Units into the skin daily at 10 pm.  . insulin lispro (HUMALOG) 100 UNIT/ML injection Inject 0.1 mLs (10 Units total) into the skin 3 (three) times daily with meals. PER SLIDING SCALE  . Lancets (ONETOUCH ULTRASOFT) lancets Use as instructed  . [DISCONTINUED] empagliflozin (JARDIANCE) 10 MG TABS tablet Take 10 mg by mouth daily before breakfast.  . [DISCONTINUED] fidaxomicin (DIFICID) 200 MG TABS tablet Take 1 tablet (200 mg total) by mouth 2 (two) times daily.  . [DISCONTINUED] folic acid (FOLVITE) 1 MG tablet Take 1 tablet (1 mg total) by mouth daily. (Patient not taking: No sig reported)  . [DISCONTINUED] furosemide (LASIX) 20 MG tablet Take 1 tablet (20 mg total) by mouth 2 (two) times daily as needed. (Patient not taking: No sig reported)  . [DISCONTINUED] Multiple Vitamin (MULTIVITAMIN WITH MINERALS) TABS tablet Take 1 tablet by mouth daily. (Patient not  taking: No sig reported)  . [DISCONTINUED] potassium chloride SA (KLOR-CON) 20 MEQ tablet Take 1 tablet (20 mEq total) by mouth daily for 10 days.  . [DISCONTINUED] pravastatin (PRAVACHOL) 40 MG tablet Take 1 tablet (40 mg total) by mouth daily. (Patient not taking: No sig reported)  . [DISCONTINUED] thiamine 100 MG tablet Take 1 tablet (100 mg total) by mouth daily. (Patient not taking: No sig reported)  . [DISCONTINUED] Vitamin D, Ergocalciferol, (DRISDOL) 1.25 MG (50000 UNIT)  CAPS capsule Take 1 capsule (50,000 Units total) by mouth every 7 (seven) days. (Patient not taking: No sig reported)   No facility-administered encounter medications on file as of 09/12/2020.     REVIEW OF SYSTEMS  : All other systems reviewed and negative except where noted in the History of Present Illness.   PHYSICAL EXAM: BP 102/62   Pulse (!) 105   Ht 5\' 11"  (1.803 m)   Wt 144 lb 9.6 oz (65.6 kg)   SpO2 99%   BMI 20.17 kg/m  General: Thin white male in no acute distress Head: Normocephalic and atraumatic Eyes:  Sclerae anicteric, conjunctivs pink. Ears: Normal auditory acuity Lungs: Clear throughout to auscultation; no W/R/R. Heart: Regular rate and rhythm; no M/R/G. Abdomen: Soft, non-distended.  BS present.  Non-tender. Musculoskeletal: Symmetrical with no gross deformities  Skin: No lesions on visible extremities Extremities: No edema  Neurological: Alert oriented x 4, grossly non-focal Psychological:  Alert and cooperative. Normal mood and affect  ASSESSMENT AND PLAN: *Ongoing diarrhea, likely ongoing Cdiff infection: Initially diagnosed in November and has been treated with course of Flagyl, two 10 to 14-day course of vancomycin, and then a course of Dificid when I saw him in January.  He had really no improvement with the Dificid.  He is on Florastor probiotic.  I would like him to repeat a stool study although I suspect it will still be positive.  We will put him on vancomycin taper.  We will refer him to see Dr. February in infectious disease for consideration of Zinplava infusion in addition to his vancomycin.  Prescription for vancomycin sent to his pharmacy.   CC:  Drue Second, FNP

## 2020-09-12 NOTE — Patient Instructions (Addendum)
If you are age 45 or older, your body mass index should be between 23-30. Your Body mass index is 20.17 kg/m. If this is out of the aforementioned range listed, please consider follow up with your Primary Care Provider.  If you are age 7 or younger, your body mass index should be between 19-25. Your Body mass index is 20.17 kg/m. If this is out of the aformentioned range listed, please consider follow up with your Primary Care Provider.   Your provider has requested that you go to the basement level for lab work before leaving today. Press "B" on the elevator. The lab is located at the first door on the left as you exit the elevator.  Dr.Sniders office will call you to make an appointment.   Thank you for choosing me and [facility].  Doug Sou, PA-C

## 2020-09-14 ENCOUNTER — Telehealth: Payer: Self-pay

## 2020-09-14 NOTE — Telephone Encounter (Signed)
RCID Patient Product/process development scientist completed.    The patient is insured through [facility] and has a $3.00 copay.  Zinplava is covered under insurance.  We will continue to follow to see if copay assistance is needed.  Clearance Coots, CPhT Specialty Pharmacy Patient [facility] Phone: [phone] Fax:  [phone]

## 2020-09-17 ENCOUNTER — Encounter: Payer: Self-pay | Admitting: Internal Medicine

## 2020-09-17 ENCOUNTER — Telehealth: Payer: Self-pay

## 2020-09-17 ENCOUNTER — Ambulatory Visit (INDEPENDENT_AMBULATORY_CARE_PROVIDER_SITE_OTHER): Payer: Medicaid Other | Admitting: Internal Medicine

## 2020-09-17 ENCOUNTER — Other Ambulatory Visit: Payer: Self-pay

## 2020-09-17 VITALS — BP 116/83 | HR 99 | Temp 97.6°F | Wt 151.0 lb

## 2020-09-17 DIAGNOSIS — A0471 Enterocolitis due to Clostridium difficile, recurrent: Secondary | ICD-10-CM

## 2020-09-17 DIAGNOSIS — R55 Syncope and collapse: Secondary | ICD-10-CM | POA: Diagnosis not present

## 2020-09-17 DIAGNOSIS — Z794 Long term (current) use of insulin: Secondary | ICD-10-CM

## 2020-09-17 DIAGNOSIS — E119 Type 2 diabetes mellitus without complications: Secondary | ICD-10-CM | POA: Diagnosis not present

## 2020-09-17 LAB — CLOSTRIDIUM DIFFICILE TOXIN B, QUALITATIVE, REAL-TIME PCR: Toxigenic C. Difficile by PCR: DETECTED — AB

## 2020-09-17 NOTE — Telephone Encounter (Signed)
-----   Message from Leta Baptist, PA-C sent at 09/17/2020  9:49 AM EDT ----- Regarding: FW: cdiff management Will you please contact the patient and let him know that [name] from [facility] got in touch with me.  She says that she is not sold on how well Zinplava works.  She says that [place], [place], and [facility] are still doing fecal transplants.  Can you please refer him to 1 of those facilities for fecal transplant?  Study was still positive.  He should be on vancomycin taper.  ----- Message ----- From: Judyann Munson, MD Sent: 09/14/2020  12:26 PM EDT To: Leta Baptist, PA-C Subject: cdiff management                               Hey Shanda Bumps, I just found out that [facility], [facility], and [facility] are still doing FMT. Just refer patient ref to outside gi for FMT  In terms of zinplava -its pretty easy to coordinate. We just make sure there isn't any contra-indications, your coordinators can call the infusion center to get zinplava app. The coordinators just need to get insurance approval. I am still not sold how well it works.Marland Kitchen  ----- Message ----- From: Deon Pilling, LPN Sent: 11/15/8414   8:53 AM EDT To: Judyann Munson, MD

## 2020-09-17 NOTE — Progress Notes (Signed)
RFV: cdifficile referral  Patient ID: Tony Long, male   DOB: 02-Apr-1976, 45 y.o.   MRN: [id number]  HPI Tony Long is a 45yo M with history IDDM2, hx of cdifficile colitis including being hospitalized. Going to bathroom 10 days Has been hospitalized for cdifficile. He has been on vancomycin taper since discharged in dec 2021, having either 10 day courses off for a fw weeks then recurrence, thus restarted on treatment. Had been on 10 days of dificid -- then off of treatment for 3 weeks. "wouldn't have know if you have taken medication". Previously off for 2 wks then now ha recurrence with + test on 3/16 He subscribes to [employer] at night time. He is now on his  4th cycle of vancomycin.- now on a taper. Currently on vanco 125mg  QID since Wednesday of last week. He has not noticed any  difference. BM ranges from mucousy to soft stool, occasionally yellow watery fluid.doesnt consume much dairy. No blood in stool, no abdominal cramping, ever.. has urgency.. "tired of wearing diapers"  He is referred to our clinic for zinplava and also Interested in FMT.  ROS: Has been at 150lb.- since last fall, so not significant weight loss Has lost 70# in the last 3 years since diabetes diagnosis  ? Intermittent syncope, with fall with injury Not always from sitting to standing, sometimes occurs with walking   Family hx: dad has colonic polyp but not colon cancer?; father at one point had colostomy, reversal, colectomy but unclear for what reason.  Outpatient Encounter Medications as of 09/17/2020  Medication Sig  . gabapentin (NEURONTIN) 300 MG capsule Take 2 capsule (600 mg= total) by mouth, 3 times a day.  09/19/2020 glucose blood test strip Use as instructed  . insulin glargine (LANTUS) 100 UNIT/ML Solostar Pen Inject 30 Units into the skin daily at 10 pm.  . insulin lispro (HUMALOG) 100 UNIT/ML injection Inject 0.1 mLs (10 Units total) into the skin 3 (three) times daily with meals. PER SLIDING SCALE   . Lancets (ONETOUCH ULTRASOFT) lancets Use as instructed  . vancomycin (VANCOCIN) 125 MG capsule Take one capsule 4 times daily for 2 weeks , Then tale one capsule twice daily for seven days , then take one capsule every other day for 4 weeks.   No facility-administered encounter medications on file as of 09/17/2020.     Patient Active Problem List   Diagnosis Date Noted  . Hospital discharge follow-up 09/12/2020  . Protein-calorie malnutrition, severe 05/29/2020  . C. difficile diarrhea 05/28/2020  . Hypokalemia due to excessive gastrointestinal loss of potassium 05/28/2020  . Lactic acidosis 05/28/2020  . History of intravenous drug abuse (HCC) 05/28/2020  . Acute alcoholic pancreatitis 05/28/2020  . Diabetic polyneuropathy associated with type 2 diabetes mellitus (HCC) 05/28/2020  . Open wound of right thigh 05/28/2020  . Alcohol abuse 05/28/2020  . Pressure injury of skin 04/08/2020  . Abscess 04/07/2020  . DKA (diabetic ketoacidosis) (HCC) 04/06/2020  . Hemoglobin A1C greater than 9%, indicating poor diabetic control 08/09/2019  . History of delayed wound healing 08/09/2019  . IV drug user 08/09/2019  . Neuropathy 03/29/2019  . Wound of right foot 03/29/2019  . Essential hypertension 05/17/2018  . 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 Opioid use disorder, severe, dependence (HCC) 03/21/2018  . Osteomyelitis (HCC) 03/20/2018  . Chronic hepatitis C without hepatic coma (HCC) 07/02/2016     Health Maintenance Due  Topic Date Due  . PNEUMOCOCCAL POLYSACCHARIDE VACCINE AGE 24-64 HIGH RISK  Never  done  . OPHTHALMOLOGY EXAM  Never done  . URINE MICROALBUMIN  06/02/2017  . FOOT EXAM  09/07/2019  . COVID-19 Vaccine (3 - Booster for Pfizer series) 05/21/2020    Social History   Tobacco Use  . Smoking status: Current Every Day Smoker    Packs/day: 1.00    Types: Cigarettes  . Smokeless tobacco: Never Used  Vaping Use  . Vaping Use: Never used  Substance Use Topics   . Alcohol use: Yes    Comment: 1 fifth liquor per week; daily   . Drug use: Not Currently    Types: Cocaine    Comment: patient reports being abstinent since 03/2019   Review of Systems 12 point ros is negative except what is mentioned above. Physical Exam   BP 116/83   Pulse 99   Temp 97.6 F (36.4 C) (Oral)   Wt 151 lb (68.5 kg)   SpO2 100%   BMI 21.06 kg/m   Physical Exam  Constitutional: He is oriented to person, place, and time. He appears well-developed and well-nourished. No distress.  HENT:  Mouth/Throat: Oropharynx is clear and moist. No oropharyngeal exudate.  Cardiovascular: Normal rate, regular rhythm and normal heart sounds. Exam reveals no gallop and no friction rub.  No murmur heard.  Pulmonary/Chest: Effort normal and breath sounds normal. No respiratory distress. He has no wheezes.  Abdominal: Soft. Bowel sounds are normal. He exhibits no distension. There is no tenderness.  Lymphadenopathy:  He has no cervical adenopathy.  Neurological: He is alert and oriented to person, place, and time.  Skin: Skin is warm and dry. No rash noted. No erythema.  Psychiatric: He has a normal mood and affect. His behavior is normal.    Lab Results  Component Value Date   HEPBSAB NEG 09/22/2016   No results found for: RPR, LABRPR  CBC Lab Results  Component Value Date   WBC 6.0 05/30/2020   RBC 2.80 (L) 05/30/2020   HGB 9.7 (L) 05/30/2020   HCT 29.9 (L) 05/30/2020   PLT 113 (L) 05/30/2020   MCV 106.8 (H) 05/30/2020   MCH 34.6 (H) 05/30/2020   MCHC 32.4 05/30/2020   RDW 14.7 05/30/2020   LYMPHSABS 1.0 05/28/2020   MONOABS 0.3 05/28/2020   EOSABS 0.0 05/28/2020    BMET Lab Results  Component Value Date   NA 134 (L) 05/30/2020   K 4.3 05/30/2020   CL 100 05/30/2020   CO2 28 05/30/2020   GLUCOSE 274 (H) 05/30/2020   BUN 8 05/30/2020   CREATININE 0.59 (L) 05/30/2020   CALCIUM 8.2 (L) 05/30/2020   GFRNONAA >60 05/30/2020   GFRAA 143 02/29/2020     Assessment and Plan  Recurrent cdifficile= Will have him continue on vancomycin taper Refer to [facility] for fecal transplant If not going to happen soon then will do zinplava infusion  Syncope = cards (may benefit from loop monitor) vs. Neurology (unclear if seizure like process?)  IDDM = hasn't seen endocrine. Will refer to endocrine for better management of IDDM.

## 2020-09-18 NOTE — Telephone Encounter (Signed)
Tried to reach pt no answer an mail box full.  Placed a call to [facility] left message for a call back.  Will also try to reach the pt to confirm which location he would prefer to go.

## 2020-09-19 NOTE — Telephone Encounter (Signed)
The pt has been advised and is ok with referral to [facility].  I have faxed the referral form.  He is on vanc taper.  He will call our office if he has not heard from them in a week.

## 2020-09-19 NOTE — Progress Notes (Signed)
Addendum: Reviewed and agree with assessment and management plan. Recurrent C. difficile, again positive by PCR 1 week ago. Agree with referral to [name] with [facility].   If Zinplava unsuccessful may need referral to [facility] or [facility] for FMT Continue vancomycin taper Pyrtle, Carie Caddy, MD

## 2020-10-02 ENCOUNTER — Telehealth: Payer: Self-pay

## 2020-10-02 NOTE — Telephone Encounter (Signed)
Patient's mother called office to express concern over patient's wellbeing. She reports that he's have multiple falls in the last few weeks, one of which he hit his head. He is scheduled with [facility] on 5/27 but was hoping for something sooner. Patient has completed most recent Vancomycin taper. She reports he's stated that he's giving up and "just wants to go" because he's feeling poorly. RN was not able to disclose information as unaware of permissions to do so but did recommend to her that if he's reporting feeling this unwell that he needs to be taken to the ED for assessment, especially in light of the increase in falls. She isn't sure that he will go to the ED but RN suggested calling EMS for assessment and he can decide from there. Will forward to provider. RN will follow up with patient tomorrow 4/6.   Barbette Mcglaun Loyola Mast, RN

## 2020-10-03 NOTE — Telephone Encounter (Signed)
Attempted to call patient to follow up on mother's call yesterday. Phone went to voicemail however unable to leave message as mailbox is full.  Ronneisha Jett Loyola Mast, RN

## 2020-10-05 ENCOUNTER — Emergency Department: Payer: Medicaid Other

## 2020-10-05 ENCOUNTER — Other Ambulatory Visit: Payer: Self-pay

## 2020-10-05 ENCOUNTER — Inpatient Hospital Stay
Admission: EM | Admit: 2020-10-05 | Discharge: 2020-10-28 | DRG: 637 | Disposition: E | Payer: Medicaid Other | Attending: Internal Medicine | Admitting: Internal Medicine

## 2020-10-05 DIAGNOSIS — R1310 Dysphagia, unspecified: Secondary | ICD-10-CM | POA: Diagnosis present

## 2020-10-05 DIAGNOSIS — I10 Essential (primary) hypertension: Secondary | ICD-10-CM | POA: Diagnosis not present

## 2020-10-05 DIAGNOSIS — A0472 Enterocolitis due to Clostridium difficile, not specified as recurrent: Secondary | ICD-10-CM | POA: Diagnosis not present

## 2020-10-05 DIAGNOSIS — E878 Other disorders of electrolyte and fluid balance, not elsewhere classified: Secondary | ICD-10-CM | POA: Diagnosis present

## 2020-10-05 DIAGNOSIS — G629 Polyneuropathy, unspecified: Secondary | ICD-10-CM | POA: Diagnosis not present

## 2020-10-05 DIAGNOSIS — E872 Acidosis, unspecified: Secondary | ICD-10-CM | POA: Diagnosis present

## 2020-10-05 DIAGNOSIS — R001 Bradycardia, unspecified: Secondary | ICD-10-CM | POA: Diagnosis not present

## 2020-10-05 DIAGNOSIS — Z515 Encounter for palliative care: Secondary | ICD-10-CM

## 2020-10-05 DIAGNOSIS — J90 Pleural effusion, not elsewhere classified: Secondary | ICD-10-CM | POA: Diagnosis not present

## 2020-10-05 DIAGNOSIS — E1110 Type 2 diabetes mellitus with ketoacidosis without coma: Principal | ICD-10-CM | POA: Diagnosis present

## 2020-10-05 DIAGNOSIS — Z681 Body mass index (BMI) 19 or less, adult: Secondary | ICD-10-CM

## 2020-10-05 DIAGNOSIS — F1110 Opioid abuse, uncomplicated: Secondary | ICD-10-CM | POA: Diagnosis present

## 2020-10-05 DIAGNOSIS — K3189 Other diseases of stomach and duodenum: Secondary | ICD-10-CM | POA: Diagnosis not present

## 2020-10-05 DIAGNOSIS — R579 Shock, unspecified: Secondary | ICD-10-CM | POA: Diagnosis not present

## 2020-10-05 DIAGNOSIS — I639 Cerebral infarction, unspecified: Secondary | ICD-10-CM | POA: Diagnosis not present

## 2020-10-05 DIAGNOSIS — F1910 Other psychoactive substance abuse, uncomplicated: Secondary | ICD-10-CM | POA: Diagnosis present

## 2020-10-05 DIAGNOSIS — R918 Other nonspecific abnormal finding of lung field: Secondary | ICD-10-CM | POA: Diagnosis not present

## 2020-10-05 DIAGNOSIS — Z79899 Other long term (current) drug therapy: Secondary | ICD-10-CM

## 2020-10-05 DIAGNOSIS — I9589 Other hypotension: Secondary | ICD-10-CM | POA: Diagnosis not present

## 2020-10-05 DIAGNOSIS — A0471 Enterocolitis due to Clostridium difficile, recurrent: Secondary | ICD-10-CM | POA: Diagnosis present

## 2020-10-05 DIAGNOSIS — Z833 Family history of diabetes mellitus: Secondary | ICD-10-CM

## 2020-10-05 DIAGNOSIS — Z8669 Personal history of other diseases of the nervous system and sense organs: Secondary | ICD-10-CM | POA: Diagnosis not present

## 2020-10-05 DIAGNOSIS — Z8249 Family history of ischemic heart disease and other diseases of the circulatory system: Secondary | ICD-10-CM

## 2020-10-05 DIAGNOSIS — R600 Localized edema: Secondary | ICD-10-CM | POA: Diagnosis not present

## 2020-10-05 DIAGNOSIS — E876 Hypokalemia: Secondary | ICD-10-CM | POA: Diagnosis present

## 2020-10-05 DIAGNOSIS — I4581 Long QT syndrome: Secondary | ICD-10-CM | POA: Diagnosis not present

## 2020-10-05 DIAGNOSIS — R0600 Dyspnea, unspecified: Secondary | ICD-10-CM | POA: Diagnosis not present

## 2020-10-05 DIAGNOSIS — E860 Dehydration: Secondary | ICD-10-CM | POA: Diagnosis present

## 2020-10-05 DIAGNOSIS — E43 Unspecified severe protein-calorie malnutrition: Secondary | ICD-10-CM

## 2020-10-05 DIAGNOSIS — I4510 Unspecified right bundle-branch block: Secondary | ICD-10-CM | POA: Diagnosis present

## 2020-10-05 DIAGNOSIS — I6381 Other cerebral infarction due to occlusion or stenosis of small artery: Secondary | ICD-10-CM | POA: Diagnosis not present

## 2020-10-05 DIAGNOSIS — B182 Chronic viral hepatitis C: Secondary | ICD-10-CM | POA: Diagnosis not present

## 2020-10-05 DIAGNOSIS — Z794 Long term (current) use of insulin: Secondary | ICD-10-CM

## 2020-10-05 DIAGNOSIS — J9601 Acute respiratory failure with hypoxia: Secondary | ICD-10-CM | POA: Diagnosis not present

## 2020-10-05 DIAGNOSIS — K6389 Other specified diseases of intestine: Secondary | ICD-10-CM | POA: Diagnosis not present

## 2020-10-05 DIAGNOSIS — J9690 Respiratory failure, unspecified, unspecified whether with hypoxia or hypercapnia: Secondary | ICD-10-CM | POA: Diagnosis not present

## 2020-10-05 DIAGNOSIS — Z66 Do not resuscitate: Secondary | ICD-10-CM | POA: Diagnosis not present

## 2020-10-05 DIAGNOSIS — E861 Hypovolemia: Secondary | ICD-10-CM | POA: Diagnosis not present

## 2020-10-05 DIAGNOSIS — R531 Weakness: Secondary | ICD-10-CM | POA: Diagnosis not present

## 2020-10-05 DIAGNOSIS — Z978 Presence of other specified devices: Secondary | ICD-10-CM

## 2020-10-05 DIAGNOSIS — G9341 Metabolic encephalopathy: Secondary | ICD-10-CM | POA: Diagnosis present

## 2020-10-05 DIAGNOSIS — B1920 Unspecified viral hepatitis C without hepatic coma: Secondary | ICD-10-CM | POA: Diagnosis not present

## 2020-10-05 DIAGNOSIS — G931 Anoxic brain damage, not elsewhere classified: Secondary | ICD-10-CM | POA: Diagnosis not present

## 2020-10-05 DIAGNOSIS — I2699 Other pulmonary embolism without acute cor pulmonale: Secondary | ICD-10-CM | POA: Diagnosis not present

## 2020-10-05 DIAGNOSIS — R410 Disorientation, unspecified: Secondary | ICD-10-CM | POA: Diagnosis not present

## 2020-10-05 DIAGNOSIS — D539 Nutritional anemia, unspecified: Secondary | ICD-10-CM | POA: Diagnosis present

## 2020-10-05 DIAGNOSIS — J690 Pneumonitis due to inhalation of food and vomit: Secondary | ICD-10-CM | POA: Diagnosis present

## 2020-10-05 DIAGNOSIS — J189 Pneumonia, unspecified organism: Secondary | ICD-10-CM | POA: Diagnosis not present

## 2020-10-05 DIAGNOSIS — Z20822 Contact with and (suspected) exposure to covid-19: Secondary | ICD-10-CM | POA: Diagnosis present

## 2020-10-05 DIAGNOSIS — Z01818 Encounter for other preprocedural examination: Secondary | ICD-10-CM

## 2020-10-05 DIAGNOSIS — J3801 Paralysis of vocal cords and larynx, unilateral: Secondary | ICD-10-CM | POA: Diagnosis present

## 2020-10-05 DIAGNOSIS — F1010 Alcohol abuse, uncomplicated: Secondary | ICD-10-CM | POA: Diagnosis present

## 2020-10-05 DIAGNOSIS — R000 Tachycardia, unspecified: Secondary | ICD-10-CM | POA: Diagnosis not present

## 2020-10-05 DIAGNOSIS — E0811 Diabetes mellitus due to underlying condition with ketoacidosis with coma: Secondary | ICD-10-CM | POA: Diagnosis not present

## 2020-10-05 DIAGNOSIS — D6959 Other secondary thrombocytopenia: Secondary | ICD-10-CM | POA: Diagnosis present

## 2020-10-05 DIAGNOSIS — I468 Cardiac arrest due to other underlying condition: Secondary | ICD-10-CM | POA: Diagnosis not present

## 2020-10-05 DIAGNOSIS — D61818 Other pancytopenia: Secondary | ICD-10-CM | POA: Diagnosis present

## 2020-10-05 DIAGNOSIS — I959 Hypotension, unspecified: Secondary | ICD-10-CM

## 2020-10-05 DIAGNOSIS — R061 Stridor: Secondary | ICD-10-CM | POA: Diagnosis not present

## 2020-10-05 DIAGNOSIS — R0902 Hypoxemia: Secondary | ICD-10-CM

## 2020-10-05 DIAGNOSIS — Z4682 Encounter for fitting and adjustment of non-vascular catheter: Secondary | ICD-10-CM | POA: Diagnosis not present

## 2020-10-05 DIAGNOSIS — J984 Other disorders of lung: Secondary | ICD-10-CM | POA: Diagnosis not present

## 2020-10-05 DIAGNOSIS — E785 Hyperlipidemia, unspecified: Secondary | ICD-10-CM | POA: Diagnosis present

## 2020-10-05 DIAGNOSIS — R609 Edema, unspecified: Secondary | ICD-10-CM

## 2020-10-05 DIAGNOSIS — E0810 Diabetes mellitus due to underlying condition with ketoacidosis without coma: Secondary | ICD-10-CM | POA: Diagnosis not present

## 2020-10-05 DIAGNOSIS — A419 Sepsis, unspecified organism: Secondary | ICD-10-CM | POA: Diagnosis not present

## 2020-10-05 DIAGNOSIS — R197 Diarrhea, unspecified: Secondary | ICD-10-CM | POA: Diagnosis not present

## 2020-10-05 DIAGNOSIS — E11649 Type 2 diabetes mellitus with hypoglycemia without coma: Secondary | ICD-10-CM | POA: Diagnosis not present

## 2020-10-05 DIAGNOSIS — K709 Alcoholic liver disease, unspecified: Secondary | ICD-10-CM | POA: Diagnosis present

## 2020-10-05 DIAGNOSIS — R627 Adult failure to thrive: Secondary | ICD-10-CM | POA: Diagnosis not present

## 2020-10-05 DIAGNOSIS — J9602 Acute respiratory failure with hypercapnia: Secondary | ICD-10-CM | POA: Diagnosis present

## 2020-10-05 DIAGNOSIS — R4182 Altered mental status, unspecified: Secondary | ICD-10-CM | POA: Diagnosis not present

## 2020-10-05 DIAGNOSIS — G319 Degenerative disease of nervous system, unspecified: Secondary | ICD-10-CM | POA: Diagnosis not present

## 2020-10-05 DIAGNOSIS — I469 Cardiac arrest, cause unspecified: Secondary | ICD-10-CM | POA: Diagnosis not present

## 2020-10-05 DIAGNOSIS — G9389 Other specified disorders of brain: Secondary | ICD-10-CM | POA: Diagnosis not present

## 2020-10-05 DIAGNOSIS — J9811 Atelectasis: Secondary | ICD-10-CM | POA: Diagnosis not present

## 2020-10-05 DIAGNOSIS — E1165 Type 2 diabetes mellitus with hyperglycemia: Secondary | ICD-10-CM | POA: Diagnosis not present

## 2020-10-05 DIAGNOSIS — E162 Hypoglycemia, unspecified: Secondary | ICD-10-CM | POA: Diagnosis not present

## 2020-10-05 DIAGNOSIS — E440 Moderate protein-calorie malnutrition: Secondary | ICD-10-CM | POA: Diagnosis present

## 2020-10-05 DIAGNOSIS — F17210 Nicotine dependence, cigarettes, uncomplicated: Secondary | ICD-10-CM | POA: Diagnosis present

## 2020-10-05 DIAGNOSIS — Q211 Atrial septal defect: Secondary | ICD-10-CM | POA: Diagnosis not present

## 2020-10-05 DIAGNOSIS — E1142 Type 2 diabetes mellitus with diabetic polyneuropathy: Secondary | ICD-10-CM | POA: Diagnosis present

## 2020-10-05 DIAGNOSIS — Z452 Encounter for adjustment and management of vascular access device: Secondary | ICD-10-CM

## 2020-10-05 DIAGNOSIS — Z4659 Encounter for fitting and adjustment of other gastrointestinal appliance and device: Secondary | ICD-10-CM

## 2020-10-05 LAB — CBC
HCT: 25.4 % — ABNORMAL LOW (ref 39.0–52.0)
Hemoglobin: 8.3 g/dL — ABNORMAL LOW (ref 13.0–17.0)
MCH: 35.2 pg — ABNORMAL HIGH (ref 26.0–34.0)
MCHC: 32.7 g/dL (ref 30.0–36.0)
MCV: 107.6 fL — ABNORMAL HIGH (ref 80.0–100.0)
Platelets: 156 10*3/uL (ref 150–400)
RBC: 2.36 MIL/uL — ABNORMAL LOW (ref 4.22–5.81)
RDW: 15 % (ref 11.5–15.5)
WBC: 4.6 10*3/uL (ref 4.0–10.5)
nRBC: 0 % (ref 0.0–0.2)

## 2020-10-05 LAB — PROTIME-INR
INR: 1.2 (ref 0.8–1.2)
Prothrombin Time: 14.7 seconds (ref 11.4–15.2)

## 2020-10-05 LAB — URINALYSIS, COMPLETE (UACMP) WITH MICROSCOPIC
Bacteria, UA: NONE SEEN
Bilirubin Urine: NEGATIVE
Glucose, UA: >=500 mg/dL — AB
Hgb urine dipstick: NEGATIVE
Ketones, ur: 20 mg/dL — AB
Leukocytes,Ua: NEGATIVE
Nitrite: NEGATIVE
Protein, ur: NEGATIVE mg/dL
Specific Gravity, Urine: 1.017 (ref 1.005–1.030)
Squamous Epithelial / HPF: NONE SEEN (ref 0–5)
pH: 6 (ref 5.0–8.0)

## 2020-10-05 LAB — RESP PANEL BY RT-PCR (FLU A&B, COVID) ARPGX2
Influenza A by PCR: NEGATIVE
Influenza B by PCR: NEGATIVE
SARS Coronavirus 2 by RT PCR: NEGATIVE

## 2020-10-05 LAB — BETA-HYDROXYBUTYRIC ACID: Beta-Hydroxybutyric Acid: 5.01 mmol/L — ABNORMAL HIGH (ref 0.05–0.27)

## 2020-10-05 LAB — LACTIC ACID, PLASMA: Lactic Acid, Venous: >11 mmol/L (ref 0.5–1.9)

## 2020-10-05 LAB — APTT: aPTT: 29 seconds (ref 24–36)

## 2020-10-05 LAB — CBG MONITORING, ED
Glucose-Capillary: 321 mg/dL — ABNORMAL HIGH (ref 70–99)
Glucose-Capillary: 484 mg/dL — ABNORMAL HIGH (ref 70–99)

## 2020-10-05 MED ORDER — LACTATED RINGERS IV SOLN: INTRAVENOUS | Status: DC

## 2020-10-05 MED ORDER — SODIUM CHLORIDE 0.9 % IV BOLUS
1000.0000 mL | Freq: Once | INTRAVENOUS | Status: AC
Start: 2020-10-05 — End: 2020-10-05
  Administered 2020-10-05: 1000 mL via INTRAVENOUS

## 2020-10-05 MED ORDER — POTASSIUM CHLORIDE 10 MEQ/100ML IV SOLN
10.0000 meq | INTRAVENOUS | Status: AC
  Administered 2020-10-05 – 2020-10-06 (×4): 10 meq via INTRAVENOUS
  Filled 2020-10-05 (×3): qty 100

## 2020-10-05 MED ORDER — INSULIN REGULAR(HUMAN) IN NACL 100-0.9 UT/100ML-% IV SOLN
INTRAVENOUS | Status: DC
  Administered 2020-10-05: 10 [IU]/h via INTRAVENOUS
  Filled 2020-10-05: qty 100

## 2020-10-05 MED ORDER — POTASSIUM CHLORIDE 20 MEQ PO PACK
40.0000 meq | PACK | Freq: Once | ORAL | Status: AC
  Administered 2020-10-05: 40 meq via ORAL
  Filled 2020-10-05: qty 2

## 2020-10-05 MED ORDER — METRONIDAZOLE IN NACL 5-0.79 MG/ML-% IV SOLN
500.0000 mg | Freq: Once | INTRAVENOUS | Status: AC
  Administered 2020-10-05: 500 mg via INTRAVENOUS
  Filled 2020-10-05: qty 100

## 2020-10-05 MED ORDER — SODIUM CHLORIDE 0.9 % IV BOLUS (SEPSIS)
1000.0000 mL | Freq: Once | INTRAVENOUS | Status: AC
  Administered 2020-10-05: 1000 mL via INTRAVENOUS

## 2020-10-05 MED ORDER — SODIUM CHLORIDE 0.9 % IV SOLN
2.0000 g | Freq: Once | INTRAVENOUS | Status: AC
  Administered 2020-10-05: 2 g via INTRAVENOUS
  Filled 2020-10-05: qty 2

## 2020-10-05 MED ORDER — LACTATED RINGERS IV BOLUS (SEPSIS)
1000.0000 mL | Freq: Once | INTRAVENOUS | Status: DC
  Administered 2020-10-05: 1000 mL via INTRAVENOUS

## 2020-10-05 MED ORDER — LACTATED RINGERS IV BOLUS (SEPSIS): 250.0000 mL | Freq: Once | INTRAVENOUS | Status: DC

## 2020-10-05 MED ORDER — DEXTROSE IN LACTATED RINGERS 5 % IV SOLN: INTRAVENOUS | Status: DC

## 2020-10-05 MED ORDER — DEXTROSE 50 % IV SOLN
0.0000 mL | INTRAVENOUS | Status: DC | PRN
  Administered 2020-10-13: 50 mL via INTRAVENOUS
  Filled 2020-10-05 (×4): qty 50

## 2020-10-05 MED ORDER — POTASSIUM CHLORIDE 10 MEQ/100ML IV SOLN
10.0000 meq | INTRAVENOUS | Status: DC
  Filled 2020-10-05: qty 100

## 2020-10-05 MED ORDER — SODIUM CHLORIDE 0.9 % IV BOLUS (SEPSIS)
250.0000 mL | Freq: Once | INTRAVENOUS | Status: AC
  Administered 2020-10-05: 250 mL via INTRAVENOUS

## 2020-10-05 NOTE — ED Notes (Signed)
Lab advising unsuccessful with getting blood. Advising will send another lab tech.

## 2020-10-05 NOTE — ED Notes (Signed)
Katye RN attempted x2 for IV placement without success. MD Scotty Court attempting ultrasound IV at this time.

## 2020-10-05 NOTE — ED Notes (Signed)
This RN attempted to obtain blood cultures without success, lab called to obtain. Will give antibiotics now, per order do not delay antibiotics if unable to get blood cultures.

## 2020-10-05 NOTE — ED Notes (Addendum)
Pt's blood glucose reading 484 on glucometer, MD Scotty Court made aware.

## 2020-10-05 NOTE — ED Notes (Signed)
Pt placed on falls precaution, bed alarm in placed and audible, stretcher in lowest and locked position, call bell within reach.

## 2020-10-05 NOTE — ED Notes (Signed)
Second lab tech at bedside.

## 2020-10-05 NOTE — ED Notes (Signed)
Lab at bedside

## 2020-10-05 NOTE — ED Provider Notes (Signed)
[facility] Emergency Department Provider Note  ____________________________________________  Time seen: Approximately 11:59 PM  I have reviewed the triage vital signs and the nursing notes.   HISTORY  Chief Complaint Hyperglycemia  Level 5 Caveat: Portions of the History and Physical including HPI and review of systems are unable to be completely obtained due to patient being a poor historian    HPI Tony Long is a 45 y.o. male with a history of diabetes, hypertension, IV drug use, hepatitis C  who comes ED due to confusion and high blood sugar.  EMS report patient took insulin prior to their arrival.  Patient denies any pain, denies polyuria polydipsia, reports that he feels fine.     Past Medical History:  Diagnosis Date  . Alcohol use 02/2020  . Clostridioides difficile diarrhea 04/2020  . Diabetes mellitus without complication (HCC)   . Elevated liver enzymes 02/2020  . Elevated liver enzymes 02/2020  . Hyperlipidemia 02/2020  . Hypertension   . IV drug user   . Vitamin D deficiency 02/2020  . Wound healing, delayed      Patient Active Problem List   Diagnosis Date Noted  . Hospital discharge follow-up 09/12/2020  . Protein-calorie malnutrition, severe 05/29/2020  . C. difficile diarrhea 05/28/2020  . Hypokalemia due to excessive gastrointestinal loss of potassium 05/28/2020  . Lactic acidosis 05/28/2020  . History of intravenous drug abuse (HCC) 05/28/2020  . Acute alcoholic pancreatitis 05/28/2020  . Diabetic polyneuropathy associated with type 2 diabetes mellitus (HCC) 05/28/2020  . Open wound of right thigh 05/28/2020  . Alcohol abuse 05/28/2020  . Pressure injury of skin 04/08/2020  . Abscess 04/07/2020  . DKA (diabetic ketoacidosis) (HCC) 04/06/2020  . Hemoglobin A1C greater than 9%, indicating poor diabetic control 08/09/2019  . History of delayed wound healing 08/09/2019  . IV drug user 08/09/2019  . Neuropathy 03/29/2019   . Wound of right foot 03/29/2019  . Essential hypertension 05/17/2018  . 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 Opioid use disorder, severe, dependence (HCC) 03/21/2018  . Osteomyelitis (HCC) 03/20/2018  . Chronic hepatitis C without hepatic coma (HCC) 07/02/2016     Past Surgical History:  Procedure Laterality Date  . INCISION AND DRAINAGE ABSCESS N/A 03/22/2018   Procedure: INCISION AND DRAINAGE ABSCESS;  Surgeon: Violeta Gelinas, MD;  Location: [facility] OR;  Service: General;  Laterality: N/A;  . TEE WITHOUT CARDIOVERSION  03/22/2018   Procedure: TRANSESOPHAGEAL ECHOCARDIOGRAM (TEE);  Surgeon: Radiologist, Medication, MD;  Location: [facility] OR;  Service: Open Heart Surgery;;     Prior to Admission medications   Medication Sig Start Date End Date Taking? Authorizing Provider  gabapentin (NEURONTIN) 300 MG capsule Take 2 capsule (600 mg= total) by mouth, 3 times a day. 05/01/20  Yes [name], FNP  glucose blood test strip Use as instructed 03/13/16  Yes Henrietta Hoover, NP  insulin glargine (LANTUS) 100 UNIT/ML Solostar Pen Inject 30 Units into the skin daily at 10 pm. 05/30/20  Yes Glade Lloyd, MD  insulin lispro (HUMALOG) 100 UNIT/ML injection Inject 0.1 mLs (10 Units total) into the skin 3 (three) times daily with meals. PER SLIDING SCALE 05/30/20  Yes Glade Lloyd, MD  Lancets [facility] ULTRASOFT) lancets Use as instructed 03/13/16  Yes Henrietta Hoover, NP  vancomycin (VANCOCIN) 125 MG capsule Take one capsule 4 times daily for 2 weeks , Then tale one capsule twice daily for seven days , then take one capsule every other day for 4 weeks. 09/12/20  Yes Zehr, Princella Pellegrini, PA-C     Allergies Patient has no known allergies.   Family History  Problem Relation Age of Onset  . Heart disease Father   . Diabetes Father   . Diabetes Paternal Uncle   . Esophageal cancer Maternal Grandmother   . Colon cancer Neg Hx   . Pancreatic cancer Neg Hx   . Liver disease Neg Hx   .  Stomach cancer Neg Hx     Social History Social History   Tobacco Use  . Smoking status: Current Every Day Smoker    Packs/day: 1.00    Types: Cigarettes  . Smokeless tobacco: Never Used  Vaping Use  . Vaping Use: Never used  Substance Use Topics  . Alcohol use: Yes    Comment: 1 fifth liquor per week; daily   . Drug use: Not Currently    Types: Cocaine    Comment: patient reports being abstinent since 03/2019    Review of Systems  Constitutional:   No fever or chills.  ENT:   No sore throat. No rhinorrhea. Cardiovascular:   No chest pain or syncope. Respiratory:   No dyspnea or cough. Gastrointestinal:   Negative for abdominal pain, vomiting and diarrhea.  Musculoskeletal:   Negative for focal pain or swelling All other systems reviewed and are negative except as documented above in ROS and HPI.  ____________________________________________   PHYSICAL EXAM:  VITAL SIGNS: ED Triage Vitals  Enc Vitals Group     BP 10/10/2020 1940 (!) 77/54     Pulse Rate 10/01/2020 1940 100     Resp 09/30/2020 1941 18     Temp 10/10/2020 1951 (!) 97.5 F (36.4 C)     Temp Source 10/02/2020 1951 Oral     SpO2 10/10/2020 1940 100 %     Weight 10/18/2020 1941 150 lb (68 kg)     Height 10/09/2020 1941 5\' 11"  (1.803 m)     Head Circumference --      Peak Flow --      Pain Score 10/25/2020 1940 0     Pain Loc --      Pain Edu? --      Excl. in GC? --     Vital signs reviewed, nursing assessments reviewed.   Constitutional:   Alert and oriented.  Ill-appearing. Eyes:   Conjunctivae are normal. EOMI. PERRL. ENT      Head:   Normocephalic and atraumatic.      Nose:   Normal      Mouth/Throat: Dry mucous membranes.      Neck:   No meningismus. Full ROM. Hematological/Lymphatic/Immunilogical:   No cervical lymphadenopathy. Cardiovascular:   Tachycardia heart rate 105. Symmetric bilateral radial and DP pulses.  No murmurs. Cap refill less than 2 seconds. Respiratory:   Normal respiratory effort  without tachypnea/retractions. Breath sounds are clear and equal bilaterally. No wheezes/rales/rhonchi. Gastrointestinal:   Soft and nontender. Non distended. There is no CVA tenderness.  No rebound, rigidity, or guarding. Genitourinary:   deferred Musculoskeletal:   Normal range of motion in all extremities. No joint effusions.  No lower extremity tenderness.  No edema. Neurologic:   Normal speech and language.  Motor grossly intact. No acute focal neurologic deficits are appreciated.  Skin:    Skin is warm, dry and intact. No rash noted.  No petechiae, purpura, or bullae.  ____________________________________________    LABS (pertinent positives/negatives) (all labs ordered are listed, but only abnormal results are displayed) Labs Reviewed  BASIC  METABOLIC PANEL - Abnormal; Notable for the following components:      Result Value   Potassium <2.0 (*)    Chloride 90 (*)    CO2 18 (*)    Glucose, Bld 521 (*)    Calcium 7.8 (*)    Anion gap 27 (*)    All other components within normal limits  CBC - Abnormal; Notable for the following components:   RBC 2.36 (*)    Hemoglobin 8.3 (*)    HCT 25.4 (*)    MCV 107.6 (*)    MCH 35.2 (*)    All other components within normal limits  URINALYSIS, COMPLETE (UACMP) WITH MICROSCOPIC - Abnormal; Notable for the following components:   Color, Urine YELLOW (*)    APPearance CLEAR (*)    Glucose, UA >=500 (*)    Ketones, ur 20 (*)    All other components within normal limits  LACTIC ACID, PLASMA - Abnormal; Notable for the following components:   Lactic Acid, Venous >11.0 (*)    All other components within normal limits  BETA-HYDROXYBUTYRIC ACID - Abnormal; Notable for the following components:   Beta-Hydroxybutyric Acid 5.01 (*)    All other components within normal limits  CBG MONITORING, ED - Abnormal; Notable for the following components:   Glucose-Capillary 484 (*)    All other components within normal limits  CBG MONITORING, ED -  Abnormal; Notable for the following components:   Glucose-Capillary 321 (*)    All other components within normal limits  RESP PANEL BY RT-PCR (FLU A&B, COVID) ARPGX2  CULTURE, BLOOD (ROUTINE X 2)  CULTURE, BLOOD (ROUTINE X 2)  URINE CULTURE  APTT  PROTIME-INR  LACTIC ACID, PLASMA  BASIC METABOLIC PANEL   ____________________________________________   EKG  Interpreted by me Sinus rhythm rate of 98, normal axis, prolonged QTC of 580 ms.  Normal QRS ST segments and T waves.  ____________________________________________    RADIOLOGY  DG Chest Port 1 View  Result Date: 10/10/2020 CLINICAL DATA:  Questionable sepsis.  Evaluate for abnormality. EXAM: PORTABLE CHEST 1 VIEW COMPARISON:  05/27/2020 FINDINGS: 2016 hours. Subtle nodular opacities are seen in both lower lungs. No large focal airspace consolidation. The cardiopericardial silhouette is within normal limits for size. The visualized bony structures of the thorax show no acute abnormality. IMPRESSION: Subtle nodular opacities in both lower lungs may be sequelae of infectious/inflammatory etiology. CT chest without contrast recommended to further evaluate. Electronically Signed   By: Kennith CenterEric  Mansell M.D.   On: 01-01-21 20:40    ____________________________________________   PROCEDURES .Critical Care Performed by: Sharman CheekStafford, Ismar Yabut, MD Authorized by: Sharman CheekStafford, Kaniah Rizzolo, MD   Critical care provider statement:    Critical care time (minutes):  40   Critical care time was exclusive of:  Separately billable procedures and treating other patients   Critical care was necessary to treat or prevent imminent or life-threatening deterioration of the following conditions:  Shock, sepsis, endocrine crisis, metabolic crisis and dehydration   Critical care was time spent personally by me on the following activities:  Development of treatment plan with patient or surrogate, discussions with consultants, evaluation of patient's response to  treatment, examination of patient, obtaining history from patient or surrogate, ordering and performing treatments and interventions, ordering and review of laboratory studies, ordering and review of radiographic studies, pulse oximetry, re-evaluation of patient's condition, review of old charts and blood draw for specimens Comments:        Angiocath insertion  Date/Time: 10/06/2020 12:00 AM Performed  by: Sharman Cheek, MD Authorized by: Sharman Cheek, MD  Consent: Verbal consent obtained. Risks and benefits: risks, benefits and alternatives were discussed Consent given by: patient Patient identity confirmed: verbally with patient Local anesthesia used: no  Anesthesia: Local anesthesia used: no  Sedation: Patient sedated: no  Comments: Continuous ultrasound visualization, 20-gauge, right forearm, 2 attempts, no complications, EBL 0    .1-3 Lead EKG Interpretation Performed by: Sharman Cheek, MD Authorized by: Sharman Cheek, MD     Interpretation: abnormal     ECG rate:  100   ECG rate assessment: tachycardic     Rhythm: sinus rhythm     Ectopy: none     Conduction: normal      ____________________________________________  DIFFERENTIAL DIAGNOSIS   DKA, dehydration, electrolyte abnormality, sepsis  CLINICAL IMPRESSION / ASSESSMENT AND PLAN / ED COURSE  Medications ordered in the ED: Medications  potassium chloride 10 mEq in 100 mL IVPB (10 mEq Intravenous New Bag/Given 10/15/2020 2350)  insulin regular, human (MYXREDLIN) 100 units/ 100 mL infusion (10 Units/hr Intravenous New Bag/Given 09/29/2020 2348)  lactated ringers infusion (has no administration in time range)  dextrose 5 % in lactated ringers infusion (0 mLs Intravenous Hold 10/08/2020 2350)  dextrose 50 % solution 0-50 mL (has no administration in time range)  potassium chloride 10 mEq in 100 mL IVPB (has no administration in time range)  ceFEPIme (MAXIPIME) 2 g in sodium chloride 0.9 % 100 mL IVPB (0 g  Intravenous Stopped 10/18/2020 2105)  metroNIDAZOLE (FLAGYL) IVPB 500 mg (0 mg Intravenous Stopped 10/23/2020 2209)  sodium chloride 0.9 % bolus 1,000 mL (0 mLs Intravenous Stopped 09/30/2020 2157)    And  sodium chloride 0.9 % bolus 1,000 mL (0 mLs Intravenous Stopped 10/15/2020 2252)    And  sodium chloride 0.9 % bolus 250 mL (0 mLs Intravenous Stopped 10/08/2020 2329)  potassium chloride (KLOR-CON) packet 40 mEq (40 mEq Oral Given 10/07/2020 2153)  sodium chloride 0.9 % bolus 1,000 mL (1,000 mLs Intravenous New Bag/Given 10/24/2020 2350)    Pertinent labs & imaging results that were available during my care of the patient were reviewed by me and considered in my medical decision making (see chart for details).  Tony Long was evaluated in Emergency Department on 10/20/2020 for the symptoms described in the history of present illness. He was evaluated in the context of the global COVID-19 pandemic, which necessitated consideration that the patient might be at risk for infection with the SARS-CoV-2 virus that causes COVID-19. Institutional protocols and algorithms that pertain to the evaluation of patients at risk for COVID-19 are in a state of rapid change based on information released by regulatory bodies including the CDC and federal and state organizations. These policies and algorithms were followed during the patient's care in the ED.   Patient presents with hypertension, hyperglycemia, appears profoundly dehydrated on exam.  Clinical Course as of 10/14/2020 2359  Fri Oct 05, 2020  2026 Peripheral IV placed by me under ultrasound visualization.  [name] very dehydrated, hypotensive, suspect septic shock related C. difficile versus volume depletion from hyperglycemia and DKA.  Will give IV fluid bolus, empiric antibiotic coverage, plan to admit. [PS]  2027 Not able to obtain blood cultures at this time without significantly delaying antibiotics due to difficult IV access and hypotensive shock.  We will proceed  with antibiotics. [PS]  2331 Sepsis reassessment has been completed. Normal mental status. Bp 100/70. Vasopressors not indicated.  [PS]    Clinical Course User Index [PS]  Sharman Cheek, MD   Insulin infusion started.  Patient has taken 40 mEq potassium p.o., receiving rounds of IV potassium.   ____________________________________________   FINAL CLINICAL IMPRESSION(S) / ED DIAGNOSES    Final diagnoses:  Diabetic ketoacidosis without coma associated with type 2 diabetes mellitus (HCC)  Protein-calorie malnutrition, severe (HCC)  Chronic hepatitis C without hepatic coma [facility])     ED Discharge Orders    None      Portions of this note were generated with dragon dictation software. Dictation errors may occur despite best attempts at proofreading.   Sharman Cheek, MD 10/06/20 0003

## 2020-10-05 NOTE — ED Notes (Signed)
Pt had a 6 run beat of Vtach. Dr. Scotty Court made aware. Pt remains stable upon assessment.

## 2020-10-05 NOTE — ED Notes (Signed)
Lab advising this RN that pt has critical lactic of greater than 11. MD Scotty Court made aware.

## 2020-10-05 NOTE — Progress Notes (Signed)
CODE SEPSIS - PHARMACY COMMUNICATION  **Broad Spectrum Antibiotics should be administered within 1 hour of Sepsis diagnosis**  Time Code Sepsis Called/Page Received: 20:19  Antibiotics Ordered: Cefepime and Metronidazole  Time of 1st antibiotic administration: Cefepime given at 20:35  Additional action taken by pharmacy:    If necessary, Name of Provider/Nurse Contacted:      Foye Deer ,PharmD Clinical Pharmacist  10/13/2020  9:02 PM

## 2020-10-05 NOTE — Progress Notes (Signed)
PHARMACY -  BRIEF ANTIBIOTIC NOTE   Pharmacy has received consult(s) for Cefepime from an ED provider.  The patient's profile has been reviewed for ht/wt/allergies/indication/available labs.    One time order(s) placed for Cefepime 2g by ED provider  Further antibiotics/pharmacy consults should be ordered by admitting physician if indicated.                       Thank you, Foye Deer 10/14/2020  9:02 PM

## 2020-10-05 NOTE — ED Notes (Signed)
Lab advising pt's potassium critical at 2.0 and glucose critical at 521. MD Scotty Court made aware.

## 2020-10-05 NOTE — ED Notes (Signed)
Pt oriented to self, place and situation but stated it was 2020 when asked the year.

## 2020-10-05 NOTE — Sepsis Progress Note (Signed)
Following for Code Sepsis  

## 2020-10-05 NOTE — ED Notes (Signed)
Scotty Court MD made aware of pt's vital signs.

## 2020-10-05 NOTE — ED Triage Notes (Signed)
Pt BIB EMS from home, per EMS pt's wife states pt seemed altered to her and is experiencing high blood glucose. Pt alert and oriented at this time.EMS states patient took insulin prior to EMS arrival, pt unable to tell this RN what kind of insulin or how much. EMS states VSS en route. BP 72/51 on arrival to ED. Pt with hx of c-diff x8 months and diabetes.

## 2020-10-05 NOTE — H&P (Signed)
[name]   PATIENT NAME: Tony Long    MR#:  [id number]  DATE OF BIRTH:  1976-02-10  DATE OF ADMISSION:  2020-10-27  PRIMARY CARE PHYSICIAN: [name], FNP   [name] coming from: Home  REQUESTING/REFERRING PHYSICIAN: Alfonse Flavors, MD  CHIEF COMPLAINT:   Chief Complaint  Patient presents with  . Hyperglycemia    HISTORY OF PRESENT ILLNESS:  Tony Long is a 45 y.o. Caucasian male with medical history significant for C. difficile diarrhea, type II diabetes mellitus, hypertension, hepatitis C, dyslipidemia and alcohol abuse, who presented to the emergency room with acute onset of hyperglycemia with altered mental status with mild confusion.  Patient admitted to having diarrhea without improvement for the last 8 months.  No bright red bleeding per rectum or melena.  He denied any significant change in appetite.  He did not miss his insulin.  He denies any polyuria and polydipsia.  No dysuria, oliguria or hematuria or flank pain.  No nausea or vomiting or abdominal pain.  No chest pain or dyspnea or cough or wheezing.  ED Course: When he came to the ER initial blood pressure was 77/54 with otherwise normal vital signs.  Blood pressure came up to 110/67 with hydration and heart rate was up to 106.  Labs revealed hypokalemia of less than 2 and hypochloremia 90 hyperglycemia of 521 and calcium of 7.8 with anion gap of 27 and CO2 was 18.  Lactic acid was more than 11 and later 10.4.  CBC showed anemia with hemoglobin of 8.3 and hematocrit 25.4 slightly lower than previous levels in December of last year.  Urinalysis showed more than 500 glucose and 20 ketones with hyaline casts.  Blood and urine cultures were drawn.    EKG as reviewed by me : Showed normal sinus rhythm with a rate of 98 with poor R wave progression and prolonged QT interval with QTC of 579 MS, and T wave inversion inferiorly.   Imaging: Chest x-ray showed subtle nodular opacities in both lower  lungs that may be sequela of infectious/inflammatory etiology with recommendation for chest CT without contrast.  The patient was given IV cefepime and Flagyl, 10 mg of IV potassium chloride and a dose of p.o. with 2 L of IV normal saline bolus.  He was started on IV insulin drip per Endo tool.  He will be admitted to a stepdown unit bed for further evaluation and management PAST MEDICAL HISTORY:   Past Medical History:  Diagnosis Date  . Alcohol use 02/2020  . Clostridioides difficile diarrhea 04/2020  . Diabetes mellitus without complication (HCC)   . Elevated liver enzymes 02/2020  . Elevated liver enzymes 02/2020  . Hyperlipidemia 02/2020  . Hypertension   . IV drug user   . Vitamin D deficiency 02/2020  . Wound healing, delayed     PAST SURGICAL HISTORY:   Past Surgical History:  Procedure Laterality Date  . INCISION AND DRAINAGE ABSCESS N/A 03/22/2018   Procedure: INCISION AND DRAINAGE ABSCESS;  Surgeon: Violeta Gelinas, MD;  Location[facility] OR;  Service: General;  Laterality: N/A;  . TEE WITHOUT CARDIOVERSION  03/22/2018   Procedure: TRANSESOPHAGEAL ECHOCARDIOGRAM (TEE);  Surgeon: Radiologist, Medication, MD;  Location: [facility] OR;  Service: Open Heart Surgery;;    SOCIAL HISTORY:   Social History   Tobacco Use  . Smoking status: Current Every Day Smoker    Packs/day: 1.00    Types: Cigarettes  . Smokeless tobacco: Never Used  Substance  Use Topics  . Alcohol use: Yes    Comment: 1 fifth liquor per week; daily     FAMILY HISTORY:   Family History  Problem Relation Age of Onset  . Heart disease Father   . Diabetes Father   . Diabetes Paternal Uncle   . Esophageal cancer Maternal Grandmother   . Colon cancer Neg Hx   . Pancreatic cancer Neg Hx   . Liver disease Neg Hx   . Stomach cancer Neg Hx     DRUG ALLERGIES:  No Known Allergies  REVIEW OF SYSTEMS:   ROS As per history of present illness. All pertinent systems were reviewed above. Constitutional, HEENT,  cardiovascular, respiratory, GI, GU, musculoskeletal, neuro, psychiatric, endocrine, integumentary and hematologic systems were reviewed and are otherwise negative/unremarkable except for positive findings mentioned above in the HPI.   MEDICATIONS AT HOME:   Prior to Admission medications   Medication Sig Start Date End Date Taking? Authorizing Provider  gabapentin (NEURONTIN) 300 MG capsule Take 2 capsule (600 mg= total) by mouth, 3 times a day. 05/01/20  Yes [name], FNP  glucose blood test strip Use as instructed 03/13/16  Yes Henrietta Hoover, NP  insulin glargine (LANTUS) 100 UNIT/ML Solostar Pen Inject 30 Units into the skin daily at 10 pm. 05/30/20  Yes Glade Lloyd, MD  insulin lispro (HUMALOG) 100 UNIT/ML injection Inject 0.1 mLs (10 Units total) into the skin 3 (three) times daily with meals. PER SLIDING SCALE 05/30/20  Yes Glade Lloyd, MD  Lancets [facility] ULTRASOFT) lancets Use as instructed 03/13/16  Yes Henrietta Hoover, NP  vancomycin (VANCOCIN) 125 MG capsule Take one capsule 4 times daily for 2 weeks , Then tale one capsule twice daily for seven days , then take one capsule every other day for 4 weeks. 09/12/20  Yes Zehr, Princella Pellegrini, PA-C      VITAL SIGNS:  Blood pressure 97/79, pulse 97, temperature 98 F (36.7 C), temperature source Oral, resp. rate 17, height 5\' 11"  (1.803 m), weight 68 kg, SpO2 98 %.  PHYSICAL EXAMINATION:  Physical Exam  GENERAL:  45 y.o.-year-old Caucasian patient lying in the bed with no acute distress.  EYES: Pupils equal, round, reactive to light and accommodation. No scleral icterus. Extraocular muscles intact.  HEENT: Head atraumatic, normocephalic. Oropharynx and nasopharynx clear.  NECK:  Supple, no jugular venous distention. No thyroid enlargement, no tenderness.  LUNGS: Normal breath sounds bilaterally, no wheezing, rales,rhonchi or crepitation. No use of accessory muscles of respiration.  CARDIOVASCULAR: Regular rate and  rhythm, S1, S2 normal. No murmurs, rubs, or gallops.  ABDOMEN: Soft, nondistended, nontender. Bowel sounds present. No organomegaly or mass.  EXTREMITIES: No pedal edema, cyanosis, or clubbing.  NEUROLOGIC: Cranial nerves II through XII are intact. Muscle strength 5/5 in all extremities. Sensation intact. Gait not checked.  PSYCHIATRIC: The patient is alert and oriented x 3.  Normal affect and good eye contact. SKIN: No obvious rash, lesion, or ulcer.   LABORATORY PANEL:   CBC Recent Labs  Lab 10/16/2020 2005  WBC 4.6  HGB 8.3*  HCT 25.4*  PLT 156   ------------------------------------------------------------------------------------------------------------------  Chemistries  Recent Labs  Lab 10/22/2020 2005  NA 135  K <2.0*  CL 90*  CO2 18*  GLUCOSE 521*  BUN 6  CREATININE 1.11  CALCIUM 7.8*   ------------------------------------------------------------------------------------------------------------------  Cardiac Enzymes No results for input(s): TROPONINI in the last 168 hours. ------------------------------------------------------------------------------------------------------------------  RADIOLOGY:  DG Chest Port 1 View  Result Date: 10/16/2020 CLINICAL  DATA:  Questionable sepsis.  Evaluate for abnormality. EXAM: PORTABLE CHEST 1 VIEW COMPARISON:  05/27/2020 FINDINGS: 2016 hours. Subtle nodular opacities are seen in both lower lungs. No large focal airspace consolidation. The cardiopericardial silhouette is within normal limits for size. The visualized bony structures of the thorax show no acute abnormality. IMPRESSION: Subtle nodular opacities in both lower lungs may be sequelae of infectious/inflammatory etiology. CT chest without contrast recommended to further evaluate. Electronically Signed   By: Kennith Center M.D.   On: 09/29/2020 20:40      IMPRESSION AND PLAN:  Active Problems:   DKA (diabetic ketoacidosis) (HCC)  1.  DKA with uncontrolled type 2 diabetes  mellitus. -The patient will be admitted to a stepdown unit bed. -He will be continued on IV insulin drip per Endo tool will be aggressively hydrated with IV normal saline. -We will follow serial BMPs.  2.  Intractable diarrhea with history of C. difficile colitis and diarrhea. -We will obtain stool pathogens as well as C. difficile. -The patient will be hydrated with IV normal saline as mentioned above. -We will place him on IV Flagyl and continue p.o. vancomycin pending further results.  3.  Hypokalemia. -Potassium will be replaced and magnesium level will be checked.  4.  Peripheral neuropathy. -We will continue Neurontin.  DVT prophylaxis: Lovenox. Code Status: full code. Family Communication:  The plan of care was discussed in details with the patient with no family members available at bedside. I answered all questions. The patient agreed to proceed with the above mentioned plan. Further management will depend upon hospital course. Disposition Plan: Back to previous home environment Consults called: none. All the records are reviewed and case discussed with ED provider.  Status is: Inpatient  Remains inpatient appropriate because:Ongoing diagnostic testing needed not appropriate for outpatient work up, Unsafe d/c plan, IV treatments appropriate due to intensity of illness or inability to take PO and Inpatient level of care appropriate due to severity of illness   Dispo: The patient is from: Home              Anticipated d/c is to: Home              Patient currently is not medically stable to d/c.   Difficult to place patient No   TOTAL TIME TAKING CARE OF THIS PATIENT: 55 minutes.    Hannah Beat M.D on 10/27/2020 at 11:54 PM  [facility]   From 7 PM-7 AM, contact night-coverage [email or web address]  CC: Primary care physician; [name], FNP

## 2020-10-06 DIAGNOSIS — E1110 Type 2 diabetes mellitus with ketoacidosis without coma: Secondary | ICD-10-CM | POA: Diagnosis not present

## 2020-10-06 DIAGNOSIS — I959 Hypotension, unspecified: Secondary | ICD-10-CM

## 2020-10-06 DIAGNOSIS — G9341 Metabolic encephalopathy: Secondary | ICD-10-CM

## 2020-10-06 DIAGNOSIS — D61818 Other pancytopenia: Secondary | ICD-10-CM

## 2020-10-06 LAB — BASIC METABOLIC PANEL
Anion gap: 20 — ABNORMAL HIGH (ref 5–15)
Anion gap: 27 — ABNORMAL HIGH (ref 5–15)
Anion gap: 7 (ref 5–15)
Anion gap: 7 (ref 5–15)
BUN: 5 mg/dL — ABNORMAL LOW (ref 6–20)
BUN: 6 mg/dL (ref 6–20)
BUN: 6 mg/dL (ref 6–20)
BUN: 6 mg/dL (ref 6–20)
CO2: 18 mmol/L — ABNORMAL LOW (ref 22–32)
CO2: 21 mmol/L — ABNORMAL LOW (ref 22–32)
CO2: 29 mmol/L (ref 22–32)
CO2: 31 mmol/L (ref 22–32)
Calcium: 7.1 mg/dL — ABNORMAL LOW (ref 8.9–10.3)
Calcium: 7.2 mg/dL — ABNORMAL LOW (ref 8.9–10.3)
Calcium: 7.6 mg/dL — ABNORMAL LOW (ref 8.9–10.3)
Calcium: 7.8 mg/dL — ABNORMAL LOW (ref 8.9–10.3)
Chloride: 102 mmol/L (ref 98–111)
Chloride: 104 mmol/L (ref 98–111)
Chloride: 90 mmol/L — ABNORMAL LOW (ref 98–111)
Chloride: 97 mmol/L — ABNORMAL LOW (ref 98–111)
Creatinine, Ser: 0.7 mg/dL (ref 0.61–1.24)
Creatinine, Ser: 0.72 mg/dL (ref 0.61–1.24)
Creatinine, Ser: 0.87 mg/dL (ref 0.61–1.24)
Creatinine, Ser: 1.11 mg/dL (ref 0.61–1.24)
GFR, Estimated: >60 mL/min (ref >60)
GFR, Estimated: >60 mL/min (ref >60)
GFR, Estimated: >60 mL/min (ref >60)
GFR, Estimated: >60 mL/min (ref >60)
Glucose, Bld: 177 mg/dL — ABNORMAL HIGH (ref 70–99)
Glucose, Bld: 180 mg/dL — ABNORMAL HIGH (ref 70–99)
Glucose, Bld: 319 mg/dL — ABNORMAL HIGH (ref 70–99)
Glucose, Bld: 521 mg/dL (ref 70–99)
Potassium: 2.5 mmol/L — CL (ref 3.5–5.1)
Potassium: 2.6 mmol/L — CL (ref 3.5–5.1)
Potassium: 2.9 mmol/L — ABNORMAL LOW (ref 3.5–5.1)
Potassium: <2 mmol/L — CL (ref 3.5–5.1)
Sodium: 135 mmol/L (ref 135–145)
Sodium: 138 mmol/L (ref 135–145)
Sodium: 140 mmol/L (ref 135–145)
Sodium: 140 mmol/L (ref 135–145)

## 2020-10-06 LAB — COMPREHENSIVE METABOLIC PANEL
ALT: 29 U/L (ref 0–44)
AST: 50 U/L — ABNORMAL HIGH (ref 15–41)
Albumin: 2 g/dL — ABNORMAL LOW (ref 3.5–5.0)
Alkaline Phosphatase: 199 U/L — ABNORMAL HIGH (ref 38–126)
Anion gap: 13 (ref 5–15)
BUN: 5 mg/dL — ABNORMAL LOW (ref 6–20)
CO2: 26 mmol/L (ref 22–32)
Calcium: 7.1 mg/dL — ABNORMAL LOW (ref 8.9–10.3)
Chloride: 102 mmol/L (ref 98–111)
Creatinine, Ser: 0.83 mg/dL (ref 0.61–1.24)
GFR, Estimated: >60 mL/min (ref >60)
Glucose, Bld: 210 mg/dL — ABNORMAL HIGH (ref 70–99)
Potassium: <2 mmol/L — CL (ref 3.5–5.1)
Sodium: 141 mmol/L (ref 135–145)
Total Bilirubin: 0.6 mg/dL (ref 0.3–1.2)
Total Protein: 4.9 g/dL — ABNORMAL LOW (ref 6.5–8.1)

## 2020-10-06 LAB — GASTROINTESTINAL PANEL BY PCR, STOOL (REPLACES STOOL CULTURE)

## 2020-10-06 LAB — CBC
HCT: 20.7 % — ABNORMAL LOW (ref 39.0–52.0)
Hemoglobin: 7.1 g/dL — ABNORMAL LOW (ref 13.0–17.0)
MCH: 35 pg — ABNORMAL HIGH (ref 26.0–34.0)
MCHC: 34.3 g/dL (ref 30.0–36.0)
MCV: 102 fL — ABNORMAL HIGH (ref 80.0–100.0)
Platelets: 104 10*3/uL — ABNORMAL LOW (ref 150–400)
RBC: 2.03 MIL/uL — ABNORMAL LOW (ref 4.22–5.81)
RDW: 14.5 % (ref 11.5–15.5)
WBC: 3.3 10*3/uL — ABNORMAL LOW (ref 4.0–10.5)
nRBC: 0 % (ref 0.0–0.2)

## 2020-10-06 LAB — IRON AND TIBC: Iron: 74 ug/dL (ref 45–182)

## 2020-10-06 LAB — GLUCOSE, CAPILLARY
Glucose-Capillary: 234 mg/dL — ABNORMAL HIGH (ref 70–99)
Glucose-Capillary: 227 mg/dL — ABNORMAL HIGH (ref 70–99)
Glucose-Capillary: 186 mg/dL — ABNORMAL HIGH (ref 70–99)
Glucose-Capillary: 179 mg/dL — ABNORMAL HIGH (ref 70–99)
Glucose-Capillary: 194 mg/dL — ABNORMAL HIGH (ref 70–99)
Glucose-Capillary: 187 mg/dL — ABNORMAL HIGH (ref 70–99)
Glucose-Capillary: 71 mg/dL (ref 70–99)
Glucose-Capillary: 85 mg/dL (ref 70–99)

## 2020-10-06 LAB — MAGNESIUM
Magnesium: 1.6 mg/dL — ABNORMAL LOW (ref 1.7–2.4)
Magnesium: 2.3 mg/dL (ref 1.7–2.4)

## 2020-10-06 LAB — PHOSPHORUS: Phosphorus: 1.2 mg/dL — ABNORMAL LOW (ref 2.5–4.6)

## 2020-10-06 LAB — FOLATE: Folate: 6.3 ng/mL (ref >5.9)

## 2020-10-06 LAB — C DIFFICILE QUICK SCREEN W PCR REFLEX
C Diff antigen: POSITIVE — AB
C Diff toxin: NEGATIVE

## 2020-10-06 LAB — HEMOGLOBIN A1C
Hgb A1c MFr Bld: 11.1 % — ABNORMAL HIGH (ref 4.8–5.6)
Hgb A1c MFr Bld: 11.1 % — ABNORMAL HIGH (ref 4.8–5.6)
Mean Plasma Glucose: 271.87 mg/dL
Mean Plasma Glucose: 271.87 mg/dL

## 2020-10-06 LAB — RETICULOCYTES
Immature Retic Fract: 25 % — ABNORMAL HIGH (ref 2.3–15.9)
RBC.: 2.16 MIL/uL — ABNORMAL LOW (ref 4.22–5.81)
Retic Count, Absolute: 71.1 10*3/uL (ref 19.0–186.0)
Retic Ct Pct: 3.3 % — ABNORMAL HIGH (ref 0.4–3.1)

## 2020-10-06 LAB — HEMOGLOBIN AND HEMATOCRIT, BLOOD
HCT: 22.7 % — ABNORMAL LOW (ref 39.0–52.0)
Hemoglobin: 7.6 g/dL — ABNORMAL LOW (ref 13.0–17.0)

## 2020-10-06 LAB — MRSA PCR SCREENING: MRSA by PCR: POSITIVE — AB

## 2020-10-06 LAB — LACTIC ACID, PLASMA
Lactic Acid, Venous: 1.8 mmol/L (ref 0.5–1.9)
Lactic Acid, Venous: 10.4 mmol/L (ref 0.5–1.9)
Lactic Acid, Venous: 5.2 mmol/L (ref 0.5–1.9)

## 2020-10-06 LAB — CLOSTRIDIUM DIFFICILE BY PCR, REFLEXED: Toxigenic C. Difficile by PCR: POSITIVE — AB

## 2020-10-06 LAB — BETA-HYDROXYBUTYRIC ACID: Beta-Hydroxybutyric Acid: 3.52 mmol/L — ABNORMAL HIGH (ref 0.05–0.27)

## 2020-10-06 LAB — FERRITIN: Ferritin: 257 ng/mL (ref 24–336)

## 2020-10-06 LAB — VITAMIN B12: Vitamin B-12: 580 pg/mL (ref 180–914)

## 2020-10-06 LAB — CBG MONITORING, ED: Glucose-Capillary: 284 mg/dL — ABNORMAL HIGH (ref 70–99)

## 2020-10-06 MED ORDER — DEXTROSE 50 % IV SOLN: 0.0000 mL | INTRAVENOUS | Status: DC | PRN

## 2020-10-06 MED ORDER — POTASSIUM CHLORIDE 2 MEQ/ML IV SOLN
INTRAVENOUS | Status: DC
  Filled 2020-10-06 (×2): qty 1000

## 2020-10-06 MED ORDER — SODIUM CHLORIDE 0.9% FLUSH
10.0000 mL | INTRAVENOUS | Status: DC | PRN
Start: 2020-10-06 — End: 2020-10-18

## 2020-10-06 MED ORDER — POTASSIUM CHLORIDE 10 MEQ/100ML IV SOLN
10.0000 meq | INTRAVENOUS | Status: DC
Start: 2020-10-06 — End: 2020-10-06

## 2020-10-06 MED ORDER — PANTOPRAZOLE SODIUM 40 MG IV SOLR
40.0000 mg | INTRAVENOUS | Status: DC
  Administered 2020-10-06: 40 mg via INTRAVENOUS
  Filled 2020-10-06: qty 40

## 2020-10-06 MED ORDER — LIVING WELL WITH DIABETES BOOK
Freq: Once | Status: AC
  Filled 2020-10-06: qty 1

## 2020-10-06 MED ORDER — INSULIN REGULAR(HUMAN) IN NACL 100-0.9 UT/100ML-% IV SOLN: INTRAVENOUS | Status: DC

## 2020-10-06 MED ORDER — INSULIN ASPART 100 UNIT/ML ~~LOC~~ SOLN
0.0000 [IU] | Freq: Three times a day (TID) | SUBCUTANEOUS | Status: DC
  Administered 2020-10-06: 3 [IU] via SUBCUTANEOUS
  Filled 2020-10-06: qty 1

## 2020-10-06 MED ORDER — POTASSIUM CHLORIDE 20 MEQ PO PACK
40.0000 meq | PACK | Freq: Once | ORAL | Status: AC
  Administered 2020-10-06: 40 meq via ORAL
  Filled 2020-10-06: qty 2

## 2020-10-06 MED ORDER — MAGNESIUM SULFATE 2 GM/50ML IV SOLN
2.0000 g | Freq: Once | INTRAVENOUS | Status: AC
  Administered 2020-10-06: 2 g via INTRAVENOUS
  Filled 2020-10-06: qty 50

## 2020-10-06 MED ORDER — SODIUM CHLORIDE 0.9 % IV SOLN: INTRAVENOUS | Status: DC

## 2020-10-06 MED ORDER — POTASSIUM CHLORIDE 10 MEQ/100ML IV SOLN
10.0000 meq | INTRAVENOUS | Status: AC
  Administered 2020-10-06 (×4): 10 meq via INTRAVENOUS
  Filled 2020-10-06 (×4): qty 100

## 2020-10-06 MED ORDER — VANCOMYCIN HCL 125 MG PO CAPS
125.0000 mg | ORAL_CAPSULE | Freq: Four times a day (QID) | ORAL | Status: DC
  Filled 2020-10-06: qty 1

## 2020-10-06 MED ORDER — VANCOMYCIN 50 MG/ML ORAL SOLUTION
125.0000 mg | ORAL | Status: DC
  Administered 2020-10-08: 125 mg via ORAL
  Filled 2020-10-06: qty 2.5

## 2020-10-06 MED ORDER — GABAPENTIN 300 MG PO CAPS
600.0000 mg | ORAL_CAPSULE | Freq: Three times a day (TID) | ORAL | Status: DC
  Administered 2020-10-06 – 2020-10-07 (×4): 600 mg via ORAL
  Filled 2020-10-06 (×4): qty 2

## 2020-10-06 MED ORDER — ONDANSETRON HCL 4 MG/2ML IJ SOLN: 4.0000 mg | Freq: Four times a day (QID) | INTRAMUSCULAR | Status: DC | PRN

## 2020-10-06 MED ORDER — TRAZODONE HCL 50 MG PO TABS: 25.0000 mg | ORAL_TABLET | Freq: Every evening | ORAL | Status: DC | PRN

## 2020-10-06 MED ORDER — INSULIN GLARGINE 100 UNIT/ML ~~LOC~~ SOLN
30.0000 [IU] | Freq: Every day | SUBCUTANEOUS | Status: DC
  Administered 2020-10-06: 30 [IU] via SUBCUTANEOUS
  Filled 2020-10-06 (×2): qty 0.3

## 2020-10-06 MED ORDER — SODIUM CHLORIDE 0.9% FLUSH
10.0000 mL | Freq: Two times a day (BID) | INTRAVENOUS | Status: DC
  Administered 2020-10-06 – 2020-10-09 (×7): 10 mL
  Administered 2020-10-09: 30 mL
  Administered 2020-10-10: 10 mL
  Administered 2020-10-10: 40 mL
  Administered 2020-10-11 – 2020-10-12 (×3): 10 mL
  Administered 2020-10-12: 30 mL
  Administered 2020-10-13 – 2020-10-14 (×4): 10 mL
  Administered 2020-10-15: 30 mL
  Administered 2020-10-15 – 2020-10-17 (×4): 10 mL

## 2020-10-06 MED ORDER — INSULIN ASPART 100 UNIT/ML ~~LOC~~ SOLN: 0.0000 [IU] | Freq: Every day | SUBCUTANEOUS | Status: DC

## 2020-10-06 MED ORDER — DEXTROSE IN LACTATED RINGERS 5 % IV SOLN: INTRAVENOUS | Status: DC

## 2020-10-06 MED ORDER — VANCOMYCIN HCL 125 MG PO CAPS: 125.0000 mg | ORAL_CAPSULE | Freq: Four times a day (QID) | ORAL | Status: DC

## 2020-10-06 MED ORDER — ACETAMINOPHEN 325 MG PO TABS: 650.0000 mg | ORAL_TABLET | Freq: Four times a day (QID) | ORAL | Status: DC | PRN

## 2020-10-06 MED ORDER — POTASSIUM CHLORIDE CRYS ER 20 MEQ PO TBCR
40.0000 meq | EXTENDED_RELEASE_TABLET | Freq: Once | ORAL | Status: AC
  Administered 2020-10-06: 40 meq via ORAL
  Filled 2020-10-06: qty 2

## 2020-10-06 MED ORDER — ONDANSETRON HCL 4 MG PO TABS: 4.0000 mg | ORAL_TABLET | Freq: Four times a day (QID) | ORAL | Status: DC | PRN

## 2020-10-06 MED ORDER — VANCOMYCIN 50 MG/ML ORAL SOLUTION
125.0000 mg | Freq: Four times a day (QID) | ORAL | Status: DC
  Administered 2020-10-06: 125 mg via ORAL
  Filled 2020-10-06 (×4): qty 2.5

## 2020-10-06 MED ORDER — METRONIDAZOLE IN NACL 5-0.79 MG/ML-% IV SOLN
500.0000 mg | Freq: Three times a day (TID) | INTRAVENOUS | Status: DC
  Administered 2020-10-06: 500 mg via INTRAVENOUS
  Filled 2020-10-06 (×3): qty 100

## 2020-10-06 MED ORDER — INSULIN ASPART 100 UNIT/ML ~~LOC~~ SOLN
0.0000 [IU] | SUBCUTANEOUS | Status: DC
  Administered 2020-10-06: 3 [IU] via SUBCUTANEOUS
  Filled 2020-10-06: qty 1

## 2020-10-06 MED ORDER — CHLORHEXIDINE GLUCONATE CLOTH 2 % EX PADS
6.0000 | MEDICATED_PAD | Freq: Every day | CUTANEOUS | Status: DC
  Administered 2020-10-06 – 2020-10-17 (×9): 6 via TOPICAL

## 2020-10-06 MED ORDER — ACETAMINOPHEN 650 MG RE SUPP: 650.0000 mg | Freq: Four times a day (QID) | RECTAL | Status: DC | PRN

## 2020-10-06 MED ORDER — MUPIROCIN 2 % EX OINT
1.0000 "application " | TOPICAL_OINTMENT | Freq: Two times a day (BID) | CUTANEOUS | Status: AC
  Administered 2020-10-06 – 2020-10-10 (×10): 1 via NASAL
  Filled 2020-10-06 (×2): qty 22

## 2020-10-06 MED ORDER — BACID PO TABS
2.0000 | ORAL_TABLET | Freq: Three times a day (TID) | ORAL | Status: DC
  Administered 2020-10-06: 2 via ORAL
  Filled 2020-10-06 (×3): qty 2

## 2020-10-06 MED ORDER — POTASSIUM CHLORIDE 2 MEQ/ML IV SOLN
INTRAVENOUS | Status: DC
  Filled 2020-10-06 (×7): qty 1000

## 2020-10-06 MED ORDER — ENOXAPARIN SODIUM 40 MG/0.4ML ~~LOC~~ SOLN
40.0000 mg | SUBCUTANEOUS | Status: DC
  Administered 2020-10-06: 40 mg via SUBCUTANEOUS
  Filled 2020-10-06: qty 0.4

## 2020-10-06 MED ORDER — LOPERAMIDE HCL 2 MG PO CAPS: 2.0000 mg | ORAL_CAPSULE | ORAL | Status: DC | PRN

## 2020-10-06 MED ORDER — INSULIN ASPART 100 UNIT/ML ~~LOC~~ SOLN: 5.0000 [IU] | Freq: Three times a day (TID) | SUBCUTANEOUS | Status: DC

## 2020-10-06 MED ORDER — K PHOS MONO-SOD PHOS DI & MONO 155-852-130 MG PO TABS
500.0000 mg | ORAL_TABLET | Freq: Two times a day (BID) | ORAL | Status: AC
  Administered 2020-10-06 (×2): 500 mg via ORAL
  Filled 2020-10-06 (×2): qty 2

## 2020-10-06 MED ORDER — KCL-LACTATED RINGERS-D5W 20 MEQ/L IV SOLN
INTRAVENOUS | Status: DC
  Filled 2020-10-06 (×3): qty 1000

## 2020-10-06 NOTE — ED Notes (Signed)
Dr. Arville Care at bedside to assess pt.

## 2020-10-06 NOTE — Progress Notes (Signed)
PROGRESS NOTE    Tony Long  GMW:[id number] DOB: [birth year] DOA: 10/04/2020 PCP: [name], FNP     Brief Narrative:  Tony Long is a 45 year old male with past medical history significant for diabetes mellitus, hyperlipidemia, hypertension, history of IV drug abuse, alcohol abuse, history of hepatitis C, history of C. difficile back in November 2021 who presents to the hospital with altered mental status, elevated blood sugar.  In the emergency department work-up revealed that patient was in DKA with blood sugar greater than 500.  He was started on IV insulin and admitted to the hospital.  New events last 24 hours / Subjective: He states that he is feeling well this morning, back to his baseline.  He has no complaints of chest pain, shortness of breath, nausea, vomiting or abdominal pain.  He states that he has had persistent and constant diarrhea for the last 8 months.  He denies history of C. difficile, although there is documentation for positive testing back in November, then subsequent recurrence and follow up with GI and ID.  He remains a very poor historian overall.  Assessment & Plan:   Principal Problem:   DKA (diabetic ketoacidosis) (HCC) Active Problems:   C. difficile diarrhea   Hypokalemia   Lactic acidosis   Pancytopenia (HCC)   Acute metabolic encephalopathy   Hypotension   Hypomagnesemia   DKA -Resolved -Continue Lantus, SSI  -DM coordinator consult  -Has appointment with Endocrinology [name] 5/9   Recurrent C Diff  -C Diff Ag positive, toxin negative, PCR positive  -Followed by ID, [name]. He was initially diagnosed in November, has completed vanco x 2, dificid, then most recently vanco taper (started 3/16, should be on 1 capsule every other day for 4 weeks now, last day treatment 5/3). Referred to [name] for fecal transplant (appt at [name] 5/23), considered for zinplava infusion. Has appointment with [name] 4/13  -Continue vanco  taper   Pancytopenia -He has had chronic macrocytic anemia dating back to Sep 2019 -Leukopenia and thrombocytopenia in setting of C Diff -Trend. Stop lovenox   Acute metabolic encephalopathy -Likely secondary to DKA -Seems to be resolved at this point  Hypotension -In setting of persistent diarrhea, DKA, poor oral intake -Resolved  Hypokalemia -Replace, trend  Hypomagnesemia -Replace, trend  Lactic acidosis -Resolved    DVT prophylaxis: SCDs  Code Status: Full Family Communication: None at bedside Disposition Plan:  Status is: Inpatient  Remains inpatient appropriate because:Persistent severe electrolyte disturbances   Dispo: The patient is from: Home              Anticipated d/c is to: Home              Patient currently is not medically stable to d/c.  Likely discharge 4/10 as long as electrolyte abnormalities are resolved   Difficult to place patient No    Antimicrobials:  Anti-infectives (From admission, onward)   Start     Dose/Rate Route Frequency Ordered Stop   10/06/20 1000  vancomycin (VANCOCIN) 125 MG capsule 125 mg  Status:  Discontinued       Note to Pharmacy: Take one capsule 4 times daily for 2 weeks , Then tale one capsule twice daily for seven days , then take one capsule every other day for 4 weeks.     125 mg Oral 4 times daily 10/06/20 0039 10/06/20 0054   10/06/20 1000  vancomycin (VANCOCIN) 125 MG capsule 125 mg  Status:  Discontinued  Note to Pharmacy: Take one capsule 4 times daily for 2 weeks , Then tale one capsule twice daily for seven days , then take one capsule every other day for 4 weeks.     125 mg Oral 4 times daily 10/06/20 0054 10/06/20 0209   10/06/20 0945  vancomycin (VANCOCIN) 50 mg/mL oral solution 125 mg        125 mg Oral 4 times daily 10/06/20 0215 10/16/20 0759   10/06/20 0500  metroNIDAZOLE (FLAGYL) IVPB 500 mg        500 mg 100 mL/hr over 60 Minutes Intravenous Every 8 hours 10/06/20 0046     10/13/2020 2030   ceFEPIme (MAXIPIME) 2 g in sodium chloride 0.9 % 100 mL IVPB        2 g 200 mL/hr over 30 Minutes Intravenous  Once 10/21/2020 2019 10/11/2020 2105   10/16/2020 2030  metroNIDAZOLE (FLAGYL) IVPB 500 mg        500 mg 100 mL/hr over 60 Minutes Intravenous  Once 10/27/2020 2019 10/06/2020 2209        Objective: Vitals:   10/06/20 0717 10/06/20 0800 10/06/20 0900 10/06/20 1000  BP: (!) 103/58 106/61 (!) 100/59 (!) 101/55  Pulse: 85 89 98 87  Resp: Temp:      TempSrc:      SpO2: 97% 100% 97% 98%  Weight:      Height:        Intake/Output Summary (Last 24 hours) at 10/06/2020 1012 Last data filed at 10/06/2020 0949 Gross per 24 hour  Intake 2751.58 ml  Output 1150 ml  Net 1601.58 ml   Filed Weights   10/21/2020 1941 10/06/20 0604  Weight: 68 kg 63.5 kg    Examination:  General exam: Appears calm and comfortable  Respiratory system: Clear to auscultation. Respiratory effort normal. No respiratory distress. No conversational dyspnea.  Cardiovascular system: S1 & S2 heard, RRR. No murmurs. No pedal edema. Gastrointestinal system: Abdomen is nondistended, soft and nontender. Normal bowel sounds heard. Central nervous system: Alert and oriented. Speech clear.  Extremities: Symmetric in appearance  Skin: No rashes, lesions or ulcers on exposed skin  Psychiatry: Stable  Data Reviewed: I have personally reviewed following labs and imaging studies  CBC: Recent Labs  Lab 09/28/2020 2005 10/06/20 0650  WBC 4.6 3.3*  HGB 8.3* 7.1*  HCT 25.4* 20.7*  MCV 107.6* 102.0*  PLT 156 104*   Basic Metabolic Panel: Recent Labs  Lab 10/11/2020 2005 09/28/2020 2335 10/06/20 0216 10/06/20 0650  NA 135 138 141 140  K <2.0* 2.5* <2.0* 2.6*  CL 90* 97* 102 102  CO2 18* 21* 26 31  GLUCOSE 521* 319* 210* 180*  BUN 6 6 5* 5*  CREATININE 1.11 0.87 0.83 0.70  CALCIUM 7.8* 7.2* 7.1* 7.1*  MG  --   --  1.6*  --   PHOS  --   --   --  1.2*   GFR: Estimated Creatinine Clearance: 105.8 mL/min  (by C-G formula based on SCr of 0.7 mg/dL). Liver Function Tests: Recent Labs  Lab 10/06/20 0216  AST 50*  ALT 29  ALKPHOS 199*  BILITOT 0.6  PROT 4.9*  ALBUMIN 2.0*   No results for input(s): LIPASE, AMYLASE in the last 168 hours. No results for input(s): AMMONIA in the last 168 hours. Coagulation Profile: Recent Labs  Lab 10/02/2020 2131  INR 1.2   Cardiac Enzymes: No results for input(s): CKTOTAL, CKMB, CKMBINDEX, TROPONINI in the last  168 hours. BNP (last 3 results) No results for input(s): PROBNP in the last 8760 hours. HbA1C: No results for input(s): HGBA1C in the last 72 hours. CBG: Recent Labs  Lab 10/06/20 0055 10/06/20 0121 10/06/20 0335 10/06/20 0603 10/06/20 0740  GLUCAP 284* 234* 186* 179* 194*   Lipid Profile: No results for input(s): CHOL, HDL, LDLCALC, TRIG, CHOLHDL, LDLDIRECT in the last 72 hours. Thyroid Function Tests: No results for input(s): TSH, T4TOTAL, FREET4, T3FREE, THYROIDAB in the last 72 hours. Anemia Panel: Recent Labs    10/06/20 0551 10/06/20 0650  FOLATE  --  6.3  FERRITIN  --  257  TIBC  --  NOT CALCULATED  IRON  --  74  RETICCTPCT 3.3*  --    Sepsis Labs: Recent Labs  Lab 10/07/2020 2131 10/08/2020 2335 10/06/20 0216 10/06/20 0551  LATICACIDVEN >11.0* 10.4* 5.2* 1.8    Recent Results (from the past 240 hour(s))  Resp Panel by RT-PCR (Flu A&B, Covid) Nasopharyngeal Swab     Status: None   Collection Time: 10/04/2020  9:13 PM   Specimen: Nasopharyngeal Swab; Nasopharyngeal(NP) swabs in vial transport medium  Result Value Ref Range Status   SARS Coronavirus 2 by RT PCR NEGATIVE NEGATIVE Final    Comment: (NOTE) SARS-CoV-2 target nucleic acids are NOT DETECTED.  The SARS-CoV-2 RNA is generally detectable in upper respiratory specimens during the acute phase of infection. The lowest concentration of SARS-CoV-2 viral copies this assay can detect is 138 copies/mL. A negative result does not preclude SARS-Cov-2 infection and  should not be used as the sole basis for treatment or other patient management decisions. A negative result may occur with  improper specimen collection/handling, submission of specimen other than nasopharyngeal swab, presence of viral mutation(s) within the areas targeted by this assay, and inadequate number of viral copies(<138 copies/mL). A negative result must be combined with clinical observations, patient history, and epidemiological information. The expected result is Negative.  Fact Sheet for Patients:  BloggerCourse.com  Fact Sheet for Healthcare Providers:  SeriousBroker.it  This test is no t yet approved or cleared by the [place] FDA and  has been authorized for detection and/or diagnosis of SARS-CoV-2 by FDA under an Emergency Use Authorization (EUA). This EUA will remain  in effect (meaning this test can be used) for the duration of the COVID-19 declaration under Section 564(b)(1) of the Act, 21 U.S.C.section 360bbb-3(b)(1), unless the authorization is terminated  or revoked sooner.       Influenza A by PCR NEGATIVE NEGATIVE Final   Influenza B by PCR NEGATIVE NEGATIVE Final    Comment: (NOTE) The Xpert Xpress SARS-CoV-2/FLU/RSV plus assay is intended as an aid in the diagnosis of influenza from Nasopharyngeal swab specimens and should not be used as a sole basis for treatment. Nasal washings and aspirates are unacceptable for Xpert Xpress SARS-CoV-2/FLU/RSV testing.  Fact Sheet for Patients: BloggerCourse.com  Fact Sheet for Healthcare Providers: SeriousBroker.it  This test is not yet approved or cleared by the [place] FDA and has been authorized for detection and/or diagnosis of SARS-CoV-2 by FDA under an Emergency Use Authorization (EUA). This EUA will remain in effect (meaning this test can be used) for the duration of the COVID-19 declaration  under Section 564(b)(1) of the Act, 21 U.S.C. section 360bbb-3(b)(1), unless the authorization is terminated or revoked.  Performed at [facility], [address] [place], [place] [address]   Blood Culture (routine x 2)     Status: None (Preliminary result)  Collection Time: 10/22/2020  9:57 PM   Specimen: BLOOD  Result Value Ref Range Status   Specimen Description BLOOD  LEFT HAND  Final   Special Requests   Final    BOTTLES DRAWN AEROBIC AND ANAEROBIC Blood Culture adequate volume   Culture   Final    NO GROWTH < 12 HOURS Performed at [facility], [address], [place], [place] [address]    Report Status PENDING  Incomplete  Blood Culture (routine x 2)     Status: None (Preliminary result)   Collection Time: 10/01/2020  9:57 PM   Specimen: BLOOD  Result Value Ref Range Status   Specimen Description BLOOD  LEFT HAND  Final   Special Requests   Final    BOTTLES DRAWN AEROBIC AND ANAEROBIC Blood Culture adequate volume   Culture   Final    NO GROWTH < 12 HOURS Performed at [facility], [address] [place], [place] [address]    Report Status PENDING  Incomplete  MRSA PCR Screening     Status: Abnormal   Collection Time: 10/06/20  1:10 AM   Specimen: Nasal Mucosa; Nasopharyngeal  Result Value Ref Range Status   MRSA by PCR POSITIVE (A) NEGATIVE Final    Comment:        The GeneXpert MRSA Assay (FDA approved for NASAL specimens only), is one component of a comprehensive MRSA colonization surveillance program. It is not intended to diagnose MRSA infection nor to guide or monitor treatment for MRSA infections. Prudy Feeler RN [phone] 10/06/2020 JG Performed at [facility], [address] [place], [place] [address]   C Difficile Quick Screen w PCR reflex     Status: Abnormal   Collection Time: 10/06/20  1:30 AM   Specimen: STOOL  Result Value Ref Range Status   C Diff antigen POSITIVE (A) NEGATIVE Final   C Diff  toxin NEGATIVE NEGATIVE Final   C Diff interpretation Results are indeterminate. See PCR results.  Final    Comment: Performed at [facility], [address] [place], [place] [address]  Gastrointestinal Panel by PCR , Stool     Status: None   Collection Time: 10/06/20  1:30 AM   Specimen: Nasal Mucosa; Stool  Result Value Ref Range Status   Campylobacter species NOT DETECTED NOT DETECTED Final   Plesimonas shigelloides NOT DETECTED NOT DETECTED Final   Salmonella species NOT DETECTED NOT DETECTED Final   Yersinia enterocolitica NOT DETECTED NOT DETECTED Final   Vibrio species NOT DETECTED NOT DETECTED Final   Vibrio cholerae NOT DETECTED NOT DETECTED Final   Enteroaggregative E coli (EAEC) NOT DETECTED NOT DETECTED Final   Enteropathogenic E coli (EPEC) NOT DETECTED NOT DETECTED Final   Enterotoxigenic E coli (ETEC) NOT DETECTED NOT DETECTED Final   Shiga like toxin producing E coli (STEC) NOT DETECTED NOT DETECTED Final   Shigella/Enteroinvasive E coli (EIEC) NOT DETECTED NOT DETECTED Final   Cryptosporidium NOT DETECTED NOT DETECTED Final   Cyclospora cayetanensis NOT DETECTED NOT DETECTED Final   Entamoeba histolytica NOT DETECTED NOT DETECTED Final   Giardia lamblia NOT DETECTED NOT DETECTED Final   Adenovirus F40/41 NOT DETECTED NOT DETECTED Final   Astrovirus NOT DETECTED NOT DETECTED Final   Norovirus GI/GII NOT DETECTED NOT DETECTED Final   Rotavirus A NOT DETECTED NOT DETECTED Final   Sapovirus (I, II, IV, and V) NOT DETECTED NOT DETECTED Final    Comment: Performed at [facility], [address] [place], [place] [address]  C. Diff by PCR, Reflexed     Status: Abnormal   Collection Time: 10/06/20  1:30 AM  Result Value Ref Range Status   Toxigenic C. Difficile by PCR POSITIVE (A) NEGATIVE Final    Comment: Positive for toxigenic C. difficile with little to no toxin production. Only treat if clinical presentation suggests symptomatic illness. Performed  at [facility], [address] [place], [place] [address]       Radiology Studies: [name] [place] [facility]  Result Date: 10/07/2020 CLINICAL DATA:  Questionable sepsis.  Evaluate for abnormality. EXAM: PORTABLE CHEST 1 VIEW COMPARISON:  05/27/2020 FINDINGS: 2016 hours. Subtle nodular opacities are seen in both lower lungs. No large focal airspace consolidation. The cardiopericardial silhouette is within normal limits for size. The visualized bony structures of the thorax show no acute abnormality. IMPRESSION: Subtle nodular opacities in both lower lungs may be sequelae of infectious/inflammatory etiology. CT chest without contrast recommended to further evaluate. Electronically Signed   By: Kennith CenterEric  Mansell M.D.   On: 10/06/2020 20:40      Scheduled Meds: . Chlorhexidine Gluconate Cloth  6 each Topical Daily  . enoxaparin (LOVENOX) injection  40 mg Subcutaneous Q24H  . gabapentin  600 mg Oral TID  . insulin aspart  0-15 Units Subcutaneous Q4H  . insulin glargine  30 Units Subcutaneous Daily  . mupirocin ointment  1 application Nasal BID  . pantoprazole (PROTONIX) IV  40 mg Intravenous Q24H  . sodium chloride flush  10-40 mL Intracatheter Q12H  . vancomycin  125 mg Oral QID   Continuous Infusions: . lactated ringers with kcl 125 mL/hr at 10/06/20 0700  . metronidazole Stopped (10/06/20 0622)     LOS: 1 day      Time spent: 45 minutes   Noralee StainJennifer Blaire Palomino, DO [facility] 10/06/2020, 10:12 AM   Available via Epic secure chat 7am-7pm After these hours, please refer to coverage provider listed on [email or web address]

## 2020-10-06 NOTE — Progress Notes (Signed)
Cross Cover Patient arrived to stepdown unit for treatment of dka and sepsis from presumable c difficile infection. Lactic acid above 10. HR and  bp stable Insulin drip per endotool infusing with  last potassium result 2.5 collected prior to fluid resuscitation.  Also noted patient to have anemia, acute on chroic, in setting of alcohol abuse history.  Calcium 7.7, unable to calculate corrected. Prior transaminitis  DKA with electrolyte derangements Stat CMP and mag Potassium <2, anion gap 13 and cbg 180-200 - IV insulin stopped.  IV fluids changed to LR with 40 of potassium to infuse. Oral and IV potassium supplementation. Plan for every 4 h glucose monitoring with SQ insulin therapy Anemia - hemoccult, started IV protonix every 24 Diarrhea - suspect alcohol induced enteropathy with normal white count. Follow up Stool panel. Continue oral vanc for now until result of stool paen is completed Thrombocytopenia - monitor Lactic acidosis - improving, suspect multifactorial with hyperglycemia, dehydration, alcohol abuse, and impaired hepatic function. QTc prolongation - noted on EKG done in ED - aggressive mag and K replacement.  Avoid all QT prolonging drugs.Cardiac monitoring

## 2020-10-06 NOTE — Progress Notes (Signed)
Inpatient Diabetes Program Recommendations  AACE/ADA: New Consensus Statement on Inpatient Glycemic Control (2015)  Target Ranges:  Prepandial:   less than 140 mg/dL      Peak postprandial:   less than 180 mg/dL (1-2 hours)      Critically ill patients:  140 - 180 mg/dL   Lab Results  Component Value Date   GLUCAP 187 (H) 10/06/2020   HGBA1C 11.1 (H) 10/06/2020    Review of Glycemic Control  Diabetes history: DM2 Outpatient Diabetes medications: Lantus 30 units + Humalog 10 units tid meal coverage + Jardiance 10 mg qd Current orders for Inpatient glycemic control: Lantus 30 units + Novolog correction 0-20 units tid  Inpatient Diabetes Program Recommendations:   Please Consider: -Add Novolog 5 units tid meal coverage if eats 50% -Add Novolog 0-5 units hs correction Ordered Living Well With Diabetes Book for patient. Will followup regarding A1c 11.1 (average blood glucose 272 over the past 2-3 months). Prior A1c 05/28/20 was 8.8.  Thank you, Billy Fischer. Osie Merkin, RN, MSN, CDE  Diabetes Coordinator Inpatient Glycemic Control Team Team Pager [phone] (8am-5pm) 10/06/2020 12:20 PM

## 2020-10-07 ENCOUNTER — Inpatient Hospital Stay: Payer: Medicaid Other

## 2020-10-07 ENCOUNTER — Inpatient Hospital Stay (HOSPITAL_COMMUNITY)
Admit: 2020-10-07 | Discharge: 2020-10-07 | Disposition: A | Discharge status: Home / Self Care | Payer: Medicaid Other | Attending: Pulmonary Disease | Admitting: Pulmonary Disease

## 2020-10-07 DIAGNOSIS — E861 Hypovolemia: Secondary | ICD-10-CM | POA: Diagnosis not present

## 2020-10-07 DIAGNOSIS — E162 Hypoglycemia, unspecified: Secondary | ICD-10-CM | POA: Diagnosis not present

## 2020-10-07 DIAGNOSIS — A0472 Enterocolitis due to Clostridium difficile, not specified as recurrent: Secondary | ICD-10-CM | POA: Diagnosis not present

## 2020-10-07 DIAGNOSIS — E1110 Type 2 diabetes mellitus with ketoacidosis without coma: Principal | ICD-10-CM

## 2020-10-07 DIAGNOSIS — G9341 Metabolic encephalopathy: Secondary | ICD-10-CM | POA: Diagnosis not present

## 2020-10-07 DIAGNOSIS — I469 Cardiac arrest, cause unspecified: Secondary | ICD-10-CM

## 2020-10-07 DIAGNOSIS — I9589 Other hypotension: Secondary | ICD-10-CM | POA: Diagnosis not present

## 2020-10-07 DIAGNOSIS — E0810 Diabetes mellitus due to underlying condition with ketoacidosis without coma: Secondary | ICD-10-CM

## 2020-10-07 DIAGNOSIS — E160 Drug-induced hypoglycemia without coma: Secondary | ICD-10-CM

## 2020-10-07 DIAGNOSIS — T383X5A Adverse effect of insulin and oral hypoglycemic [antidiabetic] drugs, initial encounter: Secondary | ICD-10-CM

## 2020-10-07 DIAGNOSIS — B182 Chronic viral hepatitis C: Secondary | ICD-10-CM

## 2020-10-07 LAB — GLUCOSE, CAPILLARY
Glucose-Capillary: 21 mg/dL — CL (ref 70–99)
Glucose-Capillary: 151 mg/dL — ABNORMAL HIGH (ref 70–99)
Glucose-Capillary: 100 mg/dL — ABNORMAL HIGH (ref 70–99)
Glucose-Capillary: 118 mg/dL — ABNORMAL HIGH (ref 70–99)
Glucose-Capillary: 104 mg/dL — ABNORMAL HIGH (ref 70–99)
Glucose-Capillary: 71 mg/dL (ref 70–99)
Glucose-Capillary: 71 mg/dL (ref 70–99)
Glucose-Capillary: 91 mg/dL (ref 70–99)
Glucose-Capillary: 102 mg/dL — ABNORMAL HIGH (ref 70–99)
Glucose-Capillary: 90 mg/dL (ref 70–99)
Glucose-Capillary: 123 mg/dL — ABNORMAL HIGH (ref 70–99)
Glucose-Capillary: 164 mg/dL — ABNORMAL HIGH (ref 70–99)
Glucose-Capillary: 227 mg/dL — ABNORMAL HIGH (ref 70–99)
Glucose-Capillary: 262 mg/dL — ABNORMAL HIGH (ref 70–99)

## 2020-10-07 LAB — POTASSIUM: Potassium: 3.8 mmol/L (ref 3.5–5.1)

## 2020-10-07 LAB — BLOOD GAS, ARTERIAL
Acid-Base Excess: 1.2 mmol/L (ref 0.0–2.0)
Bicarbonate: 26 mmol/L (ref 20.0–28.0)
FIO2: 1
MECHVT: 450 mL
O2 Saturation: 100 %
PEEP: 5 cmH2O
Patient temperature: 37
RATE: 18 resp/min
pCO2 arterial: 41 mmHg (ref 32.0–48.0)
pH, Arterial: 7.41 (ref 7.350–7.450)
pO2, Arterial: 379 mmHg — ABNORMAL HIGH (ref 83.0–108.0)

## 2020-10-07 LAB — BRAIN NATRIURETIC PEPTIDE: B Natriuretic Peptide: 465.3 pg/mL — ABNORMAL HIGH (ref 0.0–100.0)

## 2020-10-07 LAB — ECHOCARDIOGRAM COMPLETE
AR max vel: 2.48 cm2
AV Peak grad: 3.4 mmHg
Ao pk vel: 0.92 m/s
Height: 71 in
S' Lateral: 3.04 cm
Weight: 2400 oz

## 2020-10-07 LAB — CBC
HCT: 26.2 % — ABNORMAL LOW (ref 39.0–52.0)
Hemoglobin: 8.6 g/dL — ABNORMAL LOW (ref 13.0–17.0)
MCH: 35.1 pg — ABNORMAL HIGH (ref 26.0–34.0)
MCHC: 32.8 g/dL (ref 30.0–36.0)
MCV: 106.9 fL — ABNORMAL HIGH (ref 80.0–100.0)
Platelets: 59 10*3/uL — ABNORMAL LOW (ref 150–400)
RBC: 2.45 MIL/uL — ABNORMAL LOW (ref 4.22–5.81)
RDW: 15.3 % (ref 11.5–15.5)
WBC: 3 10*3/uL — ABNORMAL LOW (ref 4.0–10.5)
nRBC: 5.3 % — ABNORMAL HIGH (ref 0.0–0.2)

## 2020-10-07 LAB — FIBRINOGEN: Fibrinogen: 187 mg/dL — ABNORMAL LOW (ref 210–475)

## 2020-10-07 LAB — COMPREHENSIVE METABOLIC PANEL
ALT: 270 U/L — ABNORMAL HIGH (ref 0–44)
AST: 1311 U/L — ABNORMAL HIGH (ref 15–41)
Albumin: 2.2 g/dL — ABNORMAL LOW (ref 3.5–5.0)
Alkaline Phosphatase: 268 U/L — ABNORMAL HIGH (ref 38–126)
Anion gap: 11 (ref 5–15)
BUN: 6 mg/dL (ref 6–20)
CO2: 27 mmol/L (ref 22–32)
Calcium: 7.6 mg/dL — ABNORMAL LOW (ref 8.9–10.3)
Chloride: 102 mmol/L (ref 98–111)
Creatinine, Ser: 0.74 mg/dL (ref 0.61–1.24)
GFR, Estimated: >60 mL/min (ref >60)
Glucose, Bld: 151 mg/dL — ABNORMAL HIGH (ref 70–99)
Potassium: 3 mmol/L — ABNORMAL LOW (ref 3.5–5.1)
Sodium: 140 mmol/L (ref 135–145)
Total Bilirubin: 0.9 mg/dL (ref 0.3–1.2)
Total Protein: 5.1 g/dL — ABNORMAL LOW (ref 6.5–8.1)

## 2020-10-07 LAB — PHOSPHORUS: Phosphorus: 4.3 mg/dL (ref 2.5–4.6)

## 2020-10-07 LAB — PROTIME-INR
INR: 1.5 — ABNORMAL HIGH (ref 0.8–1.2)
INR: 1.5 — ABNORMAL HIGH (ref 0.8–1.2)
Prothrombin Time: 17.6 seconds — ABNORMAL HIGH (ref 11.4–15.2)
Prothrombin Time: 17.1 seconds — ABNORMAL HIGH (ref 11.4–15.2)

## 2020-10-07 LAB — MAGNESIUM: Magnesium: 2.1 mg/dL (ref 1.7–2.4)

## 2020-10-07 LAB — LACTIC ACID, PLASMA
Lactic Acid, Venous: 4.6 mmol/L (ref 0.5–1.9)
Lactic Acid, Venous: 1.3 mmol/L (ref 0.5–1.9)

## 2020-10-07 LAB — PROCALCITONIN: Procalcitonin: 0.79 ng/mL

## 2020-10-07 LAB — CORTISOL: Cortisol, Plasma: 22.2 ug/dL

## 2020-10-07 LAB — TROPONIN I (HIGH SENSITIVITY): Troponin I (High Sensitivity): 68 ng/L — ABNORMAL HIGH (ref <18)

## 2020-10-07 LAB — AMMONIA: Ammonia: 24 umol/L (ref 9–35)

## 2020-10-07 LAB — TSH: TSH: 1.98 u[IU]/mL (ref 0.350–4.500)

## 2020-10-07 LAB — APTT: aPTT: 40 seconds — ABNORMAL HIGH (ref 24–36)

## 2020-10-07 LAB — T4, FREE: Free T4: 1.01 ng/dL (ref 0.61–1.12)

## 2020-10-07 MED ORDER — LORAZEPAM 1 MG PO TABS
1.0000 mg | ORAL_TABLET | ORAL | Status: DC | PRN
  Filled 2020-10-07: qty 2

## 2020-10-07 MED ORDER — LACTATED RINGERS IV BOLUS
1000.0000 mL | Freq: Once | INTRAVENOUS | Status: AC
  Administered 2020-10-07: 1000 mL via INTRAVENOUS

## 2020-10-07 MED ORDER — FLORANEX PO PACK
1.0000 g | PACK | Freq: Three times a day (TID) | ORAL | Status: DC
  Administered 2020-10-07 – 2020-10-08 (×3): 1 g
  Filled 2020-10-07 (×6): qty 1

## 2020-10-07 MED ORDER — POTASSIUM CHLORIDE 20 MEQ PO PACK
40.0000 meq | PACK | ORAL | Status: AC
  Administered 2020-10-07: 40 meq
  Filled 2020-10-07: qty 2

## 2020-10-07 MED ORDER — NOREPINEPHRINE 4 MG/250ML-% IV SOLN
0.0000 ug/min | INTRAVENOUS | Status: DC
  Administered 2020-10-07: 4 ug/min via INTRAVENOUS
  Administered 2020-10-08: 2 ug/min via INTRAVENOUS
  Filled 2020-10-07 (×3): qty 250

## 2020-10-07 MED ORDER — SODIUM CHLORIDE 0.9 % IV SOLN
3.0000 g | Freq: Four times a day (QID) | INTRAVENOUS | Status: AC
  Administered 2020-10-07 – 2020-10-12 (×20): 3 g via INTRAVENOUS
  Filled 2020-10-07: qty 8
  Filled 2020-10-07 (×2): qty 3
  Filled 2020-10-07: qty 8
  Filled 2020-10-07 (×2): qty 3
  Filled 2020-10-07: qty 8
  Filled 2020-10-07: qty 3
  Filled 2020-10-07: qty 8
  Filled 2020-10-07: qty 3
  Filled 2020-10-07: qty 8
  Filled 2020-10-07: qty 3
  Filled 2020-10-07: qty 8
  Filled 2020-10-07 (×6): qty 3
  Filled 2020-10-07: qty 8

## 2020-10-07 MED ORDER — POTASSIUM CHLORIDE 20 MEQ PO PACK
40.0000 meq | PACK | Freq: Once | ORAL | Status: AC
  Administered 2020-10-07: 40 meq
  Filled 2020-10-07: qty 2

## 2020-10-07 MED ORDER — POTASSIUM CHLORIDE 20 MEQ PO PACK: 40.0000 meq | PACK | ORAL | Status: DC

## 2020-10-07 MED ORDER — FENTANYL CITRATE (PF) 100 MCG/2ML IJ SOLN
50.0000 ug | Freq: Once | INTRAMUSCULAR | Status: AC
  Administered 2020-10-07: 50 ug via INTRAVENOUS
  Filled 2020-10-07: qty 2

## 2020-10-07 MED ORDER — GABAPENTIN 250 MG/5ML PO SOLN
600.0000 mg | Freq: Three times a day (TID) | ORAL | Status: DC
  Administered 2020-10-07 – 2020-10-12 (×12): 600 mg
  Filled 2020-10-07 (×18): qty 12

## 2020-10-07 MED ORDER — LACTATED RINGERS IV SOLN: INTRAVENOUS | Status: DC

## 2020-10-07 MED ORDER — BACID PO TABS
2.0000 | ORAL_TABLET | Freq: Three times a day (TID) | ORAL | Status: DC
  Filled 2020-10-07 (×4): qty 2

## 2020-10-07 MED ORDER — PROPOFOL 1000 MG/100ML IV EMUL
0.0000 ug/kg/min | INTRAVENOUS | Status: DC
Start: 2020-10-07 — End: 2020-10-10
  Administered 2020-10-07: 35 ug/kg/min via INTRAVENOUS
  Administered 2020-10-07: 10 ug/kg/min via INTRAVENOUS
  Administered 2020-10-08: 50 ug/kg/min via INTRAVENOUS
  Administered 2020-10-08 (×2): 40 ug/kg/min via INTRAVENOUS
  Administered 2020-10-08: 50 ug/kg/min via INTRAVENOUS
  Administered 2020-10-08: 35 ug/kg/min via INTRAVENOUS
  Administered 2020-10-09: 45 ug/kg/min via INTRAVENOUS
  Administered 2020-10-09: 35 ug/kg/min via INTRAVENOUS
  Administered 2020-10-09: 45 ug/kg/min via INTRAVENOUS
  Administered 2020-10-10: 35 ug/kg/min via INTRAVENOUS
  Administered 2020-10-10: 45 ug/kg/min via INTRAVENOUS
  Filled 2020-10-07 (×13): qty 100

## 2020-10-07 MED ORDER — FENTANYL BOLUS VIA INFUSION
50.0000 ug | INTRAVENOUS | Status: DC | PRN
  Administered 2020-10-09: 100 ug via INTRAVENOUS
  Filled 2020-10-07: qty 100

## 2020-10-07 MED ORDER — FENTANYL 2500MCG IN NS 250ML (10MCG/ML) PREMIX INFUSION
50.0000 ug/h | INTRAVENOUS | Status: DC
  Administered 2020-10-09: 50 ug/h via INTRAVENOUS
  Filled 2020-10-07: qty 250

## 2020-10-07 MED ORDER — THIAMINE HCL 100 MG/ML IJ SOLN
500.0000 mg | INTRAVENOUS | Status: DC
  Administered 2020-10-08 – 2020-10-10 (×3): 500 mg via INTRAVENOUS
  Filled 2020-10-07 (×5): qty 5

## 2020-10-07 MED ORDER — CHLORHEXIDINE GLUCONATE 0.12% ORAL RINSE (MEDLINE KIT)
15.0000 mL | Freq: Two times a day (BID) | OROMUCOSAL | Status: DC
  Administered 2020-10-07 – 2020-10-17 (×19): 15 mL via OROMUCOSAL

## 2020-10-07 MED ORDER — SODIUM CHLORIDE 0.9 % IV SOLN: 250.0000 mL | INTRAVENOUS | Status: DC

## 2020-10-07 MED ORDER — DEXMEDETOMIDINE HCL IN NACL 200 MCG/50ML IV SOLN
0.0000 ug/kg/h | INTRAVENOUS | Status: DC
  Filled 2020-10-07: qty 50

## 2020-10-07 MED ORDER — MIDAZOLAM HCL 2 MG/2ML IJ SOLN
2.0000 mg | INTRAMUSCULAR | Status: AC | PRN
  Administered 2020-10-07 – 2020-10-08 (×3): 2 mg via INTRAVENOUS
  Filled 2020-10-07 (×2): qty 2

## 2020-10-07 MED ORDER — POTASSIUM CHLORIDE 10 MEQ/50ML IV SOLN: 10.0000 meq | INTRAVENOUS | Status: DC

## 2020-10-07 MED ORDER — LORAZEPAM 1 MG PO TABS: 1.0000 mg | ORAL_TABLET | ORAL | Status: DC | PRN

## 2020-10-07 MED ORDER — PANTOPRAZOLE SODIUM 40 MG IV SOLR
40.0000 mg | Freq: Every day | INTRAVENOUS | Status: DC
  Administered 2020-10-07: 40 mg via INTRAVENOUS
  Filled 2020-10-07: qty 40

## 2020-10-07 MED ORDER — POTASSIUM CHLORIDE 10 MEQ/100ML IV SOLN
10.0000 meq | INTRAVENOUS | Status: DC
  Administered 2020-10-07: 10 meq via INTRAVENOUS
  Filled 2020-10-07 (×4): qty 100

## 2020-10-07 MED ORDER — THIAMINE HCL 100 MG/ML IJ SOLN: 100.0000 mg | Freq: Every day | INTRAMUSCULAR | Status: DC

## 2020-10-07 MED ORDER — ORAL CARE MOUTH RINSE
15.0000 mL | OROMUCOSAL | Status: DC
  Administered 2020-10-07 – 2020-10-10 (×33): 15 mL via OROMUCOSAL

## 2020-10-07 MED ORDER — FOLIC ACID 1 MG PO TABS
1.0000 mg | ORAL_TABLET | Freq: Every day | ORAL | Status: DC
  Administered 2020-10-07 – 2020-10-10 (×4): 1 mg
  Filled 2020-10-07 (×4): qty 1

## 2020-10-07 MED ORDER — LORAZEPAM 2 MG/ML IJ SOLN
1.0000 mg | INTRAMUSCULAR | Status: DC | PRN
  Administered 2020-10-08: 2 mg via INTRAVENOUS
  Filled 2020-10-07: qty 1

## 2020-10-07 MED ORDER — LORAZEPAM 2 MG/ML IJ SOLN: 1.0000 mg | INTRAMUSCULAR | Status: DC | PRN

## 2020-10-07 MED ORDER — DOCUSATE SODIUM 50 MG/5ML PO LIQD
100.0000 mg | Freq: Two times a day (BID) | ORAL | Status: DC
  Filled 2020-10-07: qty 10

## 2020-10-07 MED ORDER — DEXTROSE 10 % IV SOLN: INTRAVENOUS | Status: DC

## 2020-10-07 MED ORDER — DEXTROSE IN LACTATED RINGERS 5 % IV SOLN: INTRAVENOUS | Status: DC

## 2020-10-07 MED ORDER — LOPERAMIDE HCL 1 MG/7.5ML PO SUSP
2.0000 mg | ORAL | Status: DC | PRN
  Administered 2020-10-07: 2 mg
  Filled 2020-10-07 (×2): qty 15

## 2020-10-07 MED ORDER — POLYETHYLENE GLYCOL 3350 17 G PO PACK: 17.0000 g | PACK | Freq: Every day | ORAL | Status: DC

## 2020-10-07 MED ORDER — ADULT MULTIVITAMIN W/MINERALS CH
1.0000 | ORAL_TABLET | Freq: Every day | ORAL | Status: DC
  Administered 2020-10-07 – 2020-10-10 (×4): 1
  Filled 2020-10-07 (×4): qty 1

## 2020-10-07 MED ORDER — SODIUM CHLORIDE 0.9 % IV SOLN
INTRAVENOUS | Status: DC | PRN
  Administered 2020-10-07: 500 mL via INTRAVENOUS
  Administered 2020-10-07: 250 mL via INTRAVENOUS

## 2020-10-07 MED ORDER — DEXMEDETOMIDINE HCL IN NACL 400 MCG/100ML IV SOLN
0.4000 ug/kg/h | INTRAVENOUS | Status: DC
  Administered 2020-10-07: 0.4 ug/kg/h via INTRAVENOUS

## 2020-10-07 MED ORDER — MIDAZOLAM HCL 2 MG/2ML IJ SOLN
2.0000 mg | INTRAMUSCULAR | Status: DC | PRN
  Administered 2020-10-07 – 2020-10-08 (×4): 2 mg via INTRAVENOUS
  Filled 2020-10-07 (×5): qty 2

## 2020-10-07 MED FILL — Medication: Qty: 1 | Status: AC

## 2020-10-07 NOTE — Progress Notes (Signed)
Dr. Jayme Cloud notified of patients increased BP- Orders to increase propofol. Continue to monitor.

## 2020-10-07 NOTE — Progress Notes (Signed)
Patient down for heat CT with RN, RT, and transporter. PRN versed 2mg  IV given prior to CT scan. VSS.

## 2020-10-07 NOTE — Progress Notes (Signed)
Pharmacy Antibiotic Note  Tony Long is a 45 y.o. male admitted on 10/16/2020 with aspiration pneumonia.  Pharmacy has been consulted for Unasyn dosing.  Plan: Ordered Unasyn 3 gm q6h x 5 days per indication and renal fxn.  Pharmacy will continue to follow and adjust/extend abx dosing if warranted.  Height: 5\' 11"  (180.3 cm) Weight: 68 kg (150 lb) IBW/kg (Calculated) : 75.3  Temp (24hrs), Avg:98.8 F (37.1 C), Min:98.4 F (36.9 C), Max:99.1 F (37.3 C)  Recent Labs  Lab 10/26/2020 2005 10/22/2020 2131 10/09/2020 2335 10/06/20 0216 10/06/20 0551 10/06/20 0650 10/06/20 1155 10/07/20 0405  WBC 4.6  --   --   --   --  3.3*  --  3.0*  CREATININE 1.11  --  0.87 0.83  --  0.70 0.72 0.74  LATICACIDVEN  --  >11.0* 10.4* 5.2* 1.8  --   --  4.6*    Estimated Creatinine Clearance: 113.3 mL/min (by C-G formula based on SCr of 0.74 mg/dL).    No Known Allergies  Antimicrobials this admission: 4/10 Unasyn >>  4/11 Vancomycin (Oral) >>  4/9 Flagyl >> x 1  Microbiology results: 4/8 BCx: NG < 12hr 4/8 UCx: Pending  4/8 RespCx: Pending  4/9 C Diff: Positive  Thank you for allowing pharmacy to be a part of this patient's care.  6/9, PharmD, [facility] 10/07/2020 5:46 AM

## 2020-10-07 NOTE — Progress Notes (Addendum)
Cross Coversee code sheet Responded to CODE BLUE PEA arrest, initial SB on monitor without pulse and CPR initiated by nursing staff with CODE blue call per report from nursing staff. Intubated by ED provider. cbg 21 ST on monitor with pulse after  2  Amps epi, one amp  bicarb and 1 amp D50 followed by D10 fluids. Patient transferred to ICU. Dr Arville Care and ICU NP at bedside Mother called by Richardo Priest and on way to hospital

## 2020-10-07 NOTE — Progress Notes (Signed)
Spoke with Dr Jayme Cloud regarding patients output. Orders for bolus and BNP.

## 2020-10-07 NOTE — Progress Notes (Signed)
Patient back from head CT, uneventful. Will continue to monitor and await results.

## 2020-10-07 NOTE — Procedures (Signed)
Central Venous Catheter Insertion Procedure Note  Tony Long  [id number]  [birth year]  Date:10/07/20  Time:3:59 PM   Provider Performing:Paysley Poplar A Anna Genre   Procedure: Insertion of Non-tunneled Central Venous [id number]) with US guidance ([address])   Indication(s) Medication administration and Difficult access  Consent Risks of the procedure as well as the alternatives and risks of each were explained to the patient and/or caregiver.  Consent for the procedure was obtained and is signed in the bedside chart  Anesthesia sedated  Timeout Verified patient identification, verified procedure, site/side was marked, verified correct patient position, special equipment/implants available, medications/allergies/relevant history reviewed, required imaging and test results available.  Sterile Technique Maximal sterile technique including full sterile barrier drape, hand hygiene, sterile gown, sterile gloves, mask, hair covering, sterile ultrasound probe cover (if used).  Procedure Description Area of catheter insertion was cleaned with chlorhexidine and draped in sterile fashion.  With real-time ultrasound guidance a central venous catheter was placed into the right internal jugular vein. Nonpulsatile blood flow and easy flushing noted in all ports.  The catheter was sutured in place and sterile dressing applied.  Complications/Tolerance None; patient tolerated the procedure well. Chest X-ray is ordered to verify placement for internal jugular or subclavian cannulation.   Chest x-ray is not ordered for femoral cannulation.  EBL Minimal  Specimen(s) None  Line placed at 17cm mark Biopatch applied    Webb Silversmith, DNP, CCRN, FNP-C, AGACNP-BC Acute Care Nurse Practitioner  [facility] Pager: [phone] [place] [facility]

## 2020-10-07 NOTE — Progress Notes (Addendum)
CODE BLUE note:  Date of note: 10/07/2020  The patient was found unresponsive and pulseless and CODE BLUE was called at 0303.  The patient had asystole on monitor.  CPR was immediately started and the patient was bagged.  He was given 1 mg of IV epinephrine twice and 1 ampoule of sodium bicarbonate.  He was intubated via portable glidoscope by Dr. Fuller Plan.  He was noted to be hypoglycemic with a blood glucose of 21.  His latest blood glucose was 85 this evening when he was in stepdown define being transferred to progressive unit bed.  He received an amp of D50 followed by IV D10.  He regained pulse at 03:13 with sinus tachycardia and was immediately transferred to the ICU and placed on mechanical ventilation.

## 2020-10-07 NOTE — Progress Notes (Signed)
Notified NP of BG 71. D10 infusing. No new orders at this time. Will recheck BG at 8:00.

## 2020-10-07 NOTE — Progress Notes (Incomplete)
Call received from telemetry - patient HR 31.  I was in another room with Airborne precautions.  I advised tech I would check on patient.  As I was leaving Airborne room, unit nursing staff was heading to patient room with code cart.    Nursing staff entered to room to find patient unresponsive and pulseless. CPR  and code blue was initiated. Patient received lifesaving measures by code team. Patient was intubated in the room. A rhythm was noted. Glucose level was checked.   Pt transferred to ICU 15.  Report given at bedside.  Pt belongings delivered to ICU.    Staff contacted family about change in patient's condition and was advised to come to the hospital.

## 2020-10-07 NOTE — Progress Notes (Signed)
2cm X 2cm stage II pressure injury, cleansed and covered with foam dressing.

## 2020-10-07 NOTE — Consult Note (Signed)
NAME:  Tony Long, MRN:  [id number], DOB:  07-Apr-1976, LOS: 2 ADMISSION DATE:  10/20/20, CONSULTATION DATE:  10/07/20 REFERRING MD:  Dr. Arville Care, CHIEF COMPLAINT:  Hyperglycemia  History of Present Illness:  45 year old male presented to the ED via EMS due to confusion and high blood sugar.  Per ED documentation EMS reported patient took insulin prior to their arrival and on presentation to the ED reported that he, " feels fine". ED course:  On 2020/10/20 patient was hypotensive with initial BP of 77/54, improving with IV fluid resuscitation.  Other vital signs stable: T 97.5, HR 93, RR 14 & SPO2 100% on room air. Significant labs: Severely hypokalemic at <2.0, chloride 90, high anion gap metabolic acidosis with a serum CO2 of 18 & AG of 27, glucose 521, lactic acidosis at greater than 11 > 10.4 & hemoglobin 8.3.  UA revealed greater than 500 Patient was given a dose of cefepime and Flagyl as well as potassium replacement and IV fluid resuscitation.  He was also started on an IV insulin drip per Endo tool protocol for DKA. Patient was admitted to stepdown.   On 10/06/2020 patient was transferred to telemetry unit after being transitioned off of insulin drip per Endo tool protocol for DKA.  At midnight care nurse said she saw the patient resting in his room.  Around 3 AM nurse noted the patient's heart rate was bradycardic in the 30s and trending down, upon arrival to the room the patient had no pulse.  CPR was initiated and CODE BLUE team was called, first pulse check demonstrated asystole and CPR was continued. He received 2 doses of epinephrine, 1 amp of bicarb, 1 amp of D50 since his CBG was 21.  The patient was intubated by the ED physician, and ROSC was obtained at approximately 03:13.  Patient was transferred to ICU and PCCM consulted for further management and monitoring. Pertinent  Medical History  Hypertension Hyperlipidemia Chronic C. Difficile (past 9 months) EtOH abuse (1/5 of liquor  daily) Transaminitis Remote IV drug use  Significant Hospital Events: Including procedures, antibiotic start and stop dates in addition to other pertinent events   . 10-20-20-admit to stepdown on insulin drip for DKA . 10/06/2020-transferred to telemetry once transitioned off insulin drip . 10/07/2020-patient found in PEA, CODE BLUE called: Patient intubated requiring mechanical ventilation and ROSC achieved with transfer to ICU.  Interim History / Subjective:  Patient intubated on ventilator, unresponsive and unable to follow commands.  No sedation used for intubation.  [name] breathing over the ventilator with asynchrony noted as well as some accessory muscle use. Patient hemodynamically stable, with the exception of mild tachycardia.  Objective   Blood pressure 139/81, pulse (!) 103, temperature 98.4 F (36.9 C), temperature source Oral, resp. rate 16, height 5\' 11"  (1.803 m), weight 68 kg, SpO2 100 %.        Intake/Output Summary (Last 24 hours) at 10/07/2020 0317 Last data filed at 10/06/2020 1900 Gross per 24 hour  Intake 2966.32 ml  Output 350 ml  Net 2616.32 ml   Filed Weights   2020-10-20 1941 10/06/20 0604 10/06/20 1445  Weight: 68 kg 63.5 kg 68 kg    Examination: General: Adult male, critically ill, lying in bed intubated & sedated requiring mechanical ventilation HEENT: MM pink/moist, anicteric, atraumatic, neck supple Neuro: Unresponsive and unable to follow commands, PERRL +3 CV: s1s2 RRR, sinus tachycardia on monitor, no r/m/g Pulm: Regular, accessory muscle use noted on PRVC 40% & PEEP of 5,  breath sounds coarse rhonchi-BUL & diminished-BLL GI: soft, rounded, bs x 4 GU: foley in place with clear yellow urine Skin: Scattered abrasions and ecchymosis noted especially bilateral hands Extremities: warm/dry, pulses + 2 R/P, no edema noted  Labs/imaging that I have personally reviewed  (right click and "Reselect all SmartList Selections" daily)  EKG  Interpretation Date:  10/07/2020 EKG Time:  03:41 Rate:  111 Rhythm: Sinus tachycardia QRS Axis:  RAD Intervals: RBBB, prolonged QTc ST/T Wave abnormalities: ST depression V4 & T waves abnormalities (challenging assessment d/t wandering leads) Narrative Interpretation: Sinus Tachycardia with a RBBB and prolonged Qtc as well as nonspecific repolarization abnormalities in lateral leads (I, avL, V4, V5, V6)  Net: +1.6 L (+3.5 L since admit) stool episodes not accounted for Na+/ K+: 140/3 BUN/Cr.:  6/0.74 Serum CO2/ AG: 27/11  Hgb: 8.6 Platelets: 59 WBC/ TMAX: 3.0/ 37.2 Lactic/ PCT: 4.6/ 0.79  ABG: 7.41/ 41/ 379/ 26 CXR 10/07/20: no acute disease Resolved Hospital Problem list     Assessment & Plan:  Cardiac arrest: initial rhythm PEA, first rhythm check post CPR aystole  In the setting of hypokalemia & hypoglycemia PMHx: ETOH abuse, T2DM, HLD, HTN  Estimated 13 minutes downtime, CPR initiated immediately (however last seen well 3 hours earlier- found in PEA, received 0 defibrillations, suspected anoxic injury. CBG 21 Patient deemed not a candidate for TTM due to unclear downtime prior to CPR and co-morbidities - continuous cardiac monitoring - Consider vasopressors PRN, to maintain MAP > 65 - Echocardiogram ordered - Trend troponin, lactic  Acute Hypoxic Respiratory Failure in the setting of PEA arrest - Ventilator settings: PRVC  8 mL/kg, 100% FiO2, 5 PEEP, continue ventilator support & lung protective strategies - Wean PEEP & FiO2 as tolerated, maintain SpO2 > 90% - Head of bed elevated 30 degrees, VAP protocol in place - Plateau pressures less than 30 cm H20  - Intermittent chest x-ray & ABG PRN - Daily WUA with SBT as tolerated  - Ensure adequate pulmonary hygiene  - F/u cultures, trend PCT - Continue Aspiration Pna coverage: unasyn - bronchodilators PRN - PAD protocol in place: continue Fentanyl drip & Precedex drip  At risk for anoxic encephalopathy At risk for  Alcohol withdrawal Hypokalemia PMHx: ETOH abuse (drinks 1/5 of liquor daily) 13 minutes downtime, CPR initiated immediately (however last seen well 3 hours earlier- found in PEA, received 0 defibrillations, Initial head CT: pending - PAD protocol in place: precedex drip & fentanyl drip - RASS goal: 0, -1 - STAT CT head - CIWA protocol in place - high dose thiamine 500 mg x 5 days followed by 100 mg daily - folic acid & multi-vitamin daily - Daily BMP, replace electrolytes PRN  Hypoglycemia DKA this admission- resovled PMHx: T2DM CBG 21, 1 amp given in CODE then infusion started - D10 infusion initiated - Q 30 min x 2 then Q 1 hour CBG checks - follow ICU hypo/hyper-glycemia protocol  C-difficile- chronic infection since 11/21 C-diff Ag +, toxin -, PCR + Patient has been treating for recurrent C-diff infection. Has outpatient appt for fecal [facility] - continue vancomycin - ID following appreciate input  Pancytopenia PMHx: chronic macrocytic anemia (02/2018) - Monitor for s/s of bleeding - Daily CBC - Transfuse for Hgb <7 - Monitor coag panel - Consider transfusion of platelets if < 10  Transaminitis Chronically elevated hepatic levels - trend hepatic function  Best practice (right click and "Reselect all SmartList Selections" daily)  Diet:  NPO Pain/Anxiety/Delirium protocol (  if indicated): Yes (RASS goal -1) VAP protocol (if indicated): Yes DVT prophylaxis: SCD GI prophylaxis: PPI Glucose control:  SSI Yes Central venous access:  N/A Arterial line:  N/A Foley:  Yes, and it is still needed Mobility:  bed rest  PT consulted: N/A Last date of multidisciplinary goals of care discussion: 10/07/20 Code Status:  full code Disposition: ICU  Labs   CBC: Recent Labs  Lab 2021-01-21 2005 10/06/20 0650 10/06/20 1915  WBC 4.6 3.3*  --   HGB 8.3* 7.1* 7.6*  HCT 25.4* 20.7* 22.7*  MCV 107.6* 102.0*  --   PLT 156 104*  --     Basic Metabolic Panel: Recent  Labs  Lab 2021-01-21 2005 2021-01-21 2335 10/06/20 0216 10/06/20 0650 10/06/20 1155  NA 135 138 141 140 140  K <2.0* 2.5* <2.0* 2.6* 2.9*  CL 90* 97* 102 102 104  CO2 18* 21* 26 31 29   GLUCOSE 521* 319* 210* 180* 177*  BUN 6 6 5* 5* 6  CREATININE 1.11 0.87 0.83 0.70 0.72  CALCIUM 7.8* 7.2* 7.1* 7.1* 7.6*  MG  --   --  1.6*  --  2.3  PHOS  --   --   --  1.2*  --    GFR: Estimated Creatinine Clearance: 113.3 mL/min (by C-G formula based on SCr of 0.72 mg/dL). Recent Labs  Lab 2021-01-21 2005 2021-01-21 2131 2021-01-21 2335 10/06/20 0216 10/06/20 0551 10/06/20 0650  WBC 4.6  --   --   --   --  3.3*  LATICACIDVEN  --  >11.0* 10.4* 5.2* 1.8  --     Liver Function Tests: Recent Labs  Lab 10/06/20 0216  AST 50*  ALT 29  ALKPHOS 199*  BILITOT 0.6  PROT 4.9*  ALBUMIN 2.0*   No results for input(s): LIPASE, AMYLASE in the last 168 hours. No results for input(s): AMMONIA in the last 168 hours.  ABG    Component Value Date/Time   HCO3 19.9 (L) 05/27/2020 2051   TCO2 21 (L) 05/27/2020 2051   ACIDBASEDEF 5.0 (H) 05/27/2020 2051   O2SAT 96.0 05/27/2020 2051     Coagulation Profile: Recent Labs  Lab 2021-01-21 2131  INR 1.2    Cardiac Enzymes: No results for input(s): CKTOTAL, CKMB, CKMBINDEX, TROPONINI in the last 168 hours.  HbA1C: HbA1c, POC (prediabetic range)  Date/Time Value Ref Range Status  02/29/2020 09:19 AM 9.1 (A) 5.7 - 6.4 % Final  03/28/2019 11:36 AM 11.5 (A) 5.7 - 6.4 % Final   HbA1c, POC (controlled diabetic range)  Date/Time Value Ref Range Status  02/29/2020 09:19 AM 9.1 (A) 0.0 - 7.0 % Final  03/28/2019 11:36 AM 11.5 (A) 0.0 - 7.0 % Final   HbA1c POC (<> result, manual entry)  Date/Time Value Ref Range Status  02/29/2020 09:19 AM 9.1 4.0 - 5.6 % Final  03/28/2019 11:36 AM 11.5 4.0 - 5.6 % Final   Hgb A1c MFr Bld  Date/Time Value Ref Range Status  10/06/2020 06:50 AM 11.1 (H) 4.8 - 5.6 % Final    Comment:    (NOTE) Pre diabetes:           5.7%-6.4%  Diabetes:              >6.4%  Glycemic control for   <7.0% adults with diabetes   10/06/2020 05:51 AM 11.1 (H) 4.8 - 5.6 % Final    Comment:    (NOTE) Pre diabetes:  5.7%-6.4%  Diabetes:              >6.4%  Glycemic control for   <7.0% adults with diabetes     CBG: Recent Labs  Lab 10/06/20 0603 10/06/20 0740 10/06/20 1147 10/06/20 1722 10/06/20 1956  GLUCAP 179* 194* 187* 71 85    Review of Systems:   UTA- intubated and sedated  Past Medical History:  He,  has a past medical history of Alcohol use (02/2020), Clostridioides difficile diarrhea (04/2020), Diabetes mellitus without complication (HCC), Elevated liver enzymes (02/2020), Elevated liver enzymes (02/2020), Hyperlipidemia (02/2020), Hypertension, IV drug user, Vitamin D deficiency (02/2020), and Wound healing, delayed.   Surgical History:   Past Surgical History:  Procedure Laterality Date  . INCISION AND DRAINAGE ABSCESS N/A 03/22/2018   Procedure: INCISION AND DRAINAGE ABSCESS;  Surgeon: Violeta Gelinas, MD;  Location: [facility] OR;  Service: General;  Laterality: N/A;  . TEE WITHOUT CARDIOVERSION  03/22/2018   Procedure: TRANSESOPHAGEAL ECHOCARDIOGRAM (TEE);  Surgeon: Radiologist, Medication, MD;  Location: [facility] OR;  Service: Open Heart Surgery;;     Social History:   reports that he has been smoking cigarettes. He has been smoking about 1.00 pack per day. He has never used smokeless tobacco. He reports current alcohol use. He reports previous drug use. Drug: Cocaine.   Family History:  His family history includes Diabetes in his father and paternal uncle; Esophageal cancer in his maternal grandmother; Heart disease in his father. There is no history of Colon cancer, Pancreatic cancer, Liver disease, or Stomach cancer.   Allergies No Known Allergies   Home Medications  Prior to Admission medications   Medication Sig Start Date End Date Taking? Authorizing Provider  gabapentin (NEURONTIN)  300 MG capsule Take 2 capsule (600 mg= total) by mouth, 3 times a day. 05/01/20  Yes [name], FNP  glucose blood test strip Use as instructed 03/13/16  Yes Henrietta Hoover, NP  insulin glargine (LANTUS) 100 UNIT/ML Solostar Pen Inject 30 Units into the skin daily at 10 pm. 05/30/20  Yes Glade Lloyd, MD  insulin lispro (HUMALOG) 100 UNIT/ML injection Inject 0.1 mLs (10 Units total) into the skin 3 (three) times daily with meals. PER SLIDING SCALE 05/30/20  Yes Glade Lloyd, MD  Lancets [facility] ULTRASOFT) lancets Use as instructed 03/13/16  Yes Henrietta Hoover, NP  vancomycin (VANCOCIN) 125 MG capsule Take one capsule 4 times daily for 2 weeks , Then tale one capsule twice daily for seven days , then take one capsule every other day for 4 weeks. 09/12/20  Yes Zehr, Princella Pellegrini, PA-C     Critical care time: 55 minutes       Betsey Holiday, AGACNP-BC Acute Care Nurse Practitioner [facility]   [phone] / [phone] Please see Amion for pager details.

## 2020-10-07 NOTE — Progress Notes (Signed)
eLink Physician-Brief Progress Note Patient Name: Tony Long DOB: February 04, 1976 MRN: [id number]   Date of Service  10/07/2020  HPI/Events of Note  This patient is a 74M who was admitted 4/8 for DKA and admitted to the ICU, improved and then transferred to [name] floor on 4/9 after transitioning to [facility] insulin. A code blue event was called this evening -- patient was found in PEA arrest. He was hypoglycemic to the 20s which was treated with D50. No purposeful movements since ROSC achieved. NP discussed case with ICU attending Dr. Sarina Ser who did not recommend cooling in light of what was felt to be prolonged down time.  The patient is currently intubated/ventilated. He is exhibiting ventilator dyssynchrony as sedation is still being set up. He is on D10 @ 20 cc/hr to maintain euglycemia. Vitals with sinus tachycardia but normotensive at this point in time.  His other labs are notable for anemia / pancytopenia (chronic) and hypokalemia (most recent value was 2.9 at 1155 hrs on 4/9. His K was < 2.0 on 4/9 at 0216 hrs.   eICU Interventions  # Neuro: - Sedation with fent/versed for ventilator synchrony. - No TTM per Dr. Jayme Cloud. - NCHCT once stabilized + neuro consult per post-arrest protocol.  # Respiratory: - Sedation as above. - Vent Mode: PRVC FiO2 (%):  [100 %] 100 % Set Rate:  [18 bmp] 18 bmp Vt Set:  [450 mL] 450 mL PEEP:  [5 cmH20] 5 cmH20 Plateau Pressure:  [18 cmH20] 18 cmH20 - Post-intubation ABG now. Avoid alkalosis due to low potassium.  # Endocrine: - D10 infusion @ 20cc/hr. - Q30 min CBGs until stable in euglycemic range x3, then change to Q1H CBGs. - Avoid restarting insulin (even if hyperglycemic) until hypokalemia has been adequately addressed.  # Cardiac: - TTE per post-arrest protocol  # Renal: - Close attention to potassium repletion (along with repletion of other electrolytes).      Intervention Category Evaluation Type: New Patient  Evaluation  Marveen Reeks Brithany Whitworth 10/07/2020, 4:01 AM

## 2020-10-07 NOTE — Progress Notes (Signed)
*  PRELIMINARY RESULTS* Echocardiogram 2D Echocardiogram has been performed.  Garrel Ridgel Agnieszka Newhouse 10/07/2020, 11:53 AM

## 2020-10-07 NOTE — ED Provider Notes (Signed)
[place]  Department of Emergency Medicine   Code Blue CONSULT NOTE  Chief Complaint: Cardiac arrest/unresponsive   Level V Caveat: Unresponsive  History of present illness: I was contacted by the hospital for a CODE BLUE cardiac arrest upstairs and presented to the patient's bedside.    ROS: Unable to obtain, Level V caveat  Scheduled Meds: . chlorhexidine gluconate (MEDLINE KIT)  15 mL Mouth Rinse BID  . Chlorhexidine Gluconate Cloth  6 each Topical Daily  . folic acid  1 mg Per Tube Daily  . gabapentin  600 mg Oral TID  . insulin aspart  0-15 Units Subcutaneous TID WC  . insulin aspart  0-5 Units Subcutaneous QHS  . insulin aspart  5 Units Subcutaneous TID WC  . insulin glargine  30 Units Subcutaneous Daily  . lactobacillus acidophilus  2 tablet Oral TID  . mouth rinse  15 mL Mouth Rinse 10 times per day  . multivitamin with minerals  1 tablet Per Tube Daily  . mupirocin ointment  1 application Nasal BID  . pantoprazole (PROTONIX) IV  40 mg Intravenous Daily  . sodium chloride flush  10-40 mL Intracatheter Q12H  . [START ON 10/12/2020] thiamine injection  100 mg Intravenous Daily  . [START ON 10/08/2020] vancomycin  125 mg Oral QODAY   Continuous Infusions: . sodium chloride Stopped (10/07/20 8413)  . sodium chloride    . ampicillin-sulbactam (UNASYN) IV    . dexmedetomidine (PRECEDEX) IV infusion 0.4 mcg/kg/hr (10/07/20 0618)  . dextrose 30 mL/hr at 10/07/20 0618  . fentaNYL infusion INTRAVENOUS    . lactated ringers 100 mL/hr at 10/07/20 0405  . norepinephrine (LEVOPHED) Adult infusion 4 mcg/min (10/07/20 2440)  . potassium chloride 100 mL/hr at 10/07/20 0618  . thiamine injection     PRN Meds:.sodium chloride, dextrose, fentaNYL, loperamide, LORazepam **OR** LORazepam, midazolam, midazolam, sodium chloride flush Past Medical History:  Diagnosis Date  . Alcohol use 02/2020  . Clostridioides difficile diarrhea 04/2020  . Diabetes mellitus without  complication ([place])   . Elevated liver enzymes 02/2020  . Elevated liver enzymes 02/2020  . Hyperlipidemia 02/2020  . Hypertension   . IV drug user   . Vitamin D deficiency 02/2020  . Wound healing, delayed    Past Surgical History:  Procedure Laterality Date  . INCISION AND DRAINAGE ABSCESS N/A 03/22/2018   Procedure: INCISION AND DRAINAGE ABSCESS;  Surgeon: Georganna Skeans, MD;  Location: [place];  Service: General;  Laterality: N/A;  . TEE WITHOUT CARDIOVERSION  03/22/2018   Procedure: TRANSESOPHAGEAL ECHOCARDIOGRAM (TEE);  Surgeon: Radiologist, Medication, MD;  Location: [facility] OR;  Service: Open Heart Surgery;;   Social History   Socioeconomic History  . Marital status: Single    Spouse name: Not on file  . Number of children: Not on file  . Years of education: Not on file  . Highest education level: Not on file  Occupational History  . Not on file  Tobacco Use  . Smoking status: Current Every Day Smoker    Packs/day: 1.00    Types: Cigarettes  . Smokeless tobacco: Never Used  Vaping Use  . Vaping Use: Never used  Substance and Sexual Activity  . Alcohol use: Yes    Comment: 1 fifth liquor per week; daily   . Drug use: Not Currently    Types: Cocaine    Comment: patient reports being abstinent since 03/2019  . Sexual activity: Yes    Partners: Female  Other Topics Concern  .  Not on file  Social History Narrative  . Not on file   Social Determinants of Health   Financial Resource Strain: Not on file  Food Insecurity: Not on file  Transportation Needs: Not on file  Physical Activity: Not on file  Stress: Not on file  Social Connections: Not on file  Intimate Partner Violence: Not on file   No Known Allergies  Last set of Vital Signs (not current) Vitals:   10/07/20 0439 10/07/20 0525  BP: (!) 165/119 93/67  Pulse: (!) 123 (!) 119  Resp: (!) 34 (!) 26  Temp:    SpO2: 94% 100%      Physical Exam  Gen: unresponsive Cardiovascular: pulseless  Resp:  apneic. Breath sounds equal bilaterally with bagging  Abd: nondistended  Neuro: GCS 3, unresponsive to pain  HEENT: No blood in posterior pharynx, gag reflex absent  Neck: No crepitus  Musculoskeletal: No deformity  Skin: warm  Procedures  INTUBATION Performed by: Vanessa Winston Required items: required blood products, implants, devices, and special equipment available Patient identity confirmed: provided demographic data and hospital-assigned identification number Time out: Immediately prior to procedure a "time out" was called to verify the correct patient, procedure, equipment, support staff and site/side marked as required. Indications: CPR in progress Intubation method: Mcgrath video  Preoxygenation: BVM Sedatives: none  Paralytic: none Tube Size: 7.5 cuffed Post-procedure assessment: chest rise and ETCO2 monitor Breath sounds: equal and absent over the epigastrium Tube secured by Respiratory Therapy Patient tolerated the procedure well with no immediate complications.  CRITICAL CARE Performed by: Vanessa [place] Total critical care time: 15  Critical care time was exclusive of separately billable procedures and treating other patients. Critical care was necessary to treat or prevent imminent or life-threatening deterioration. Critical care was time spent personally by me on the following activities: development of treatment plan with patient and/or surrogate as well as nursing, discussions with consultants, evaluation of patient's response to treatment, examination of patient, obtaining history from patient or surrogate, ordering and performing treatments and interventions, ordering and review of laboratory studies, ordering and review of radiographic studies, pulse oximetry and re-evaluation of patient's condition.  Cardiopulmonary Resuscitation (CPR) Procedure Note 15 Directed/Performed by: Vanessa Thaxton I personally directed ancillary staff and/or performed CPR in an effort to  regain return of spontaneous circulation and to maintain cardiac, neuro and systemic perfusion.    Medical Decision making  Upon getting to the room I was the first medical provider and ACLS guidelines were started.  Initial rhythm was asystole.  Patient was given epi every 3 to 5 minutes and bicarb was given.  Glucose was checked and was hypoglycemic so dextrose was given.  When the hospitalist arrived code was handed off to them as I prepared to intubate patient.  Patient was successfully intubated and shortly after had ROSC  Assessment and Plan  Patient was transferred to the ICU for further work-up and care    Vanessa [place], MD 10/07/20 2327

## 2020-10-07 NOTE — Progress Notes (Addendum)
Spoke with NP regarding patient decreased urine output. At this time no additional orders. Continue to assess. During mouth care patient opened eyes but unable to follow any simple commands.

## 2020-10-08 ENCOUNTER — Inpatient Hospital Stay: Payer: Medicaid Other

## 2020-10-08 DIAGNOSIS — I469 Cardiac arrest, cause unspecified: Secondary | ICD-10-CM | POA: Diagnosis not present

## 2020-10-08 DIAGNOSIS — E440 Moderate protein-calorie malnutrition: Secondary | ICD-10-CM | POA: Insufficient documentation

## 2020-10-08 LAB — GLUCOSE, CAPILLARY
Glucose-Capillary: 112 mg/dL — ABNORMAL HIGH (ref 70–99)
Glucose-Capillary: 123 mg/dL — ABNORMAL HIGH (ref 70–99)
Glucose-Capillary: 128 mg/dL — ABNORMAL HIGH (ref 70–99)
Glucose-Capillary: 147 mg/dL — ABNORMAL HIGH (ref 70–99)
Glucose-Capillary: 149 mg/dL — ABNORMAL HIGH (ref 70–99)
Glucose-Capillary: 152 mg/dL — ABNORMAL HIGH (ref 70–99)
Glucose-Capillary: 154 mg/dL — ABNORMAL HIGH (ref 70–99)
Glucose-Capillary: 158 mg/dL — ABNORMAL HIGH (ref 70–99)
Glucose-Capillary: 169 mg/dL — ABNORMAL HIGH (ref 70–99)
Glucose-Capillary: 277 mg/dL — ABNORMAL HIGH (ref 70–99)
Glucose-Capillary: 320 mg/dL — ABNORMAL HIGH (ref 70–99)
Glucose-Capillary: 61 mg/dL — ABNORMAL LOW (ref 70–99)
Glucose-Capillary: 96 mg/dL (ref 70–99)

## 2020-10-08 LAB — PHOSPHORUS
Phosphorus: 1.8 mg/dL — ABNORMAL LOW (ref 2.5–4.6)
Phosphorus: 3.6 mg/dL (ref 2.5–4.6)

## 2020-10-08 LAB — CBC
HCT: 21 % — ABNORMAL LOW (ref 39.0–52.0)
Hemoglobin: 7 g/dL — ABNORMAL LOW (ref 13.0–17.0)
MCH: 35.5 pg — ABNORMAL HIGH (ref 26.0–34.0)
MCHC: 33.3 g/dL (ref 30.0–36.0)
MCV: 106.6 fL — ABNORMAL HIGH (ref 80.0–100.0)
Platelets: 55 10*3/uL — ABNORMAL LOW (ref 150–400)
RBC: 1.97 MIL/uL — ABNORMAL LOW (ref 4.22–5.81)
RDW: 15.8 % — ABNORMAL HIGH (ref 11.5–15.5)
WBC: 5 10*3/uL (ref 4.0–10.5)
nRBC: 0.4 % — ABNORMAL HIGH (ref 0.0–0.2)

## 2020-10-08 LAB — BASIC METABOLIC PANEL
Anion gap: 5 (ref 5–15)
BUN: 6 mg/dL (ref 6–20)
CO2: 28 mmol/L (ref 22–32)
Calcium: 7.5 mg/dL — ABNORMAL LOW (ref 8.9–10.3)
Chloride: 108 mmol/L (ref 98–111)
Creatinine, Ser: 0.74 mg/dL (ref 0.61–1.24)
GFR, Estimated: >60 mL/min (ref >60)
Glucose, Bld: 151 mg/dL — ABNORMAL HIGH (ref 70–99)
Potassium: 3 mmol/L — ABNORMAL LOW (ref 3.5–5.1)
Sodium: 141 mmol/L (ref 135–145)

## 2020-10-08 LAB — PREPARE RBC (CROSSMATCH)

## 2020-10-08 LAB — PROCALCITONIN: Procalcitonin: 1.47 ng/mL

## 2020-10-08 LAB — TRIGLYCERIDES: Triglycerides: 43 mg/dL (ref <150)

## 2020-10-08 LAB — URINE CULTURE: Culture: 4000 — AB

## 2020-10-08 LAB — HEMOGLOBIN AND HEMATOCRIT, BLOOD
HCT: 26.5 % — ABNORMAL LOW (ref 39.0–52.0)
Hemoglobin: 8.8 g/dL — ABNORMAL LOW (ref 13.0–17.0)

## 2020-10-08 LAB — MAGNESIUM: Magnesium: 1.8 mg/dL (ref 1.7–2.4)

## 2020-10-08 LAB — TROPONIN I (HIGH SENSITIVITY): Troponin I (High Sensitivity): 94 ng/L — ABNORMAL HIGH (ref <18)

## 2020-10-08 MED ORDER — VANCOMYCIN 50 MG/ML ORAL SOLUTION
125.0000 mg | ORAL | Status: DC
  Administered 2020-10-10 – 2020-10-16 (×4): 125 mg
  Filled 2020-10-08 (×5): qty 2.5

## 2020-10-08 MED ORDER — MAGNESIUM SULFATE 2 GM/50ML IV SOLN
2.0000 g | Freq: Once | INTRAVENOUS | Status: AC
  Administered 2020-10-08: 2 g via INTRAVENOUS
  Filled 2020-10-08: qty 50

## 2020-10-08 MED ORDER — SODIUM CHLORIDE 0.9% IV SOLUTION: Freq: Once | INTRAVENOUS | Status: AC

## 2020-10-08 MED ORDER — DEXTROSE 50 % IV SOLN
25.0000 mL | Freq: Once | INTRAVENOUS | Status: AC
  Administered 2020-10-08: 25 mL via INTRAVENOUS

## 2020-10-08 MED ORDER — PROSOURCE TF PO LIQD
45.0000 mL | Freq: Two times a day (BID) | ORAL | Status: DC
  Administered 2020-10-09 – 2020-10-10 (×3): 45 mL

## 2020-10-08 MED ORDER — FAMOTIDINE IN NACL 20-0.9 MG/50ML-% IV SOLN
20.0000 mg | Freq: Two times a day (BID) | INTRAVENOUS | Status: DC
  Administered 2020-10-08 – 2020-10-11 (×7): 20 mg via INTRAVENOUS
  Filled 2020-10-08 (×8): qty 50

## 2020-10-08 MED ORDER — DEXTROSE 5 % IV SOLN: INTRAVENOUS | Status: DC

## 2020-10-08 MED ORDER — FREE WATER
30.0000 mL | Status: DC
  Administered 2020-10-08 – 2020-10-10 (×13): 30 mL

## 2020-10-08 MED ORDER — POTASSIUM PHOSPHATES 15 MMOLE/5ML IV SOLN
45.0000 mmol | Freq: Once | INTRAVENOUS | Status: AC
  Administered 2020-10-08: 45 mmol via INTRAVENOUS
  Filled 2020-10-08: qty 15

## 2020-10-08 MED ORDER — INSULIN ASPART 100 UNIT/ML ~~LOC~~ SOLN
10.0000 [IU] | Freq: Once | SUBCUTANEOUS | Status: AC
  Administered 2020-10-08: 10 [IU] via SUBCUTANEOUS
  Filled 2020-10-08: qty 1

## 2020-10-08 MED ORDER — INSULIN ASPART 100 UNIT/ML ~~LOC~~ SOLN
0.0000 [IU] | SUBCUTANEOUS | Status: DC
  Administered 2020-10-08 (×2): 2 [IU] via SUBCUTANEOUS
  Administered 2020-10-08: 3 [IU] via SUBCUTANEOUS
  Administered 2020-10-08: 2 [IU] via SUBCUTANEOUS
  Administered 2020-10-08: 8 [IU] via SUBCUTANEOUS
  Administered 2020-10-09 (×2): 3 [IU] via SUBCUTANEOUS
  Administered 2020-10-09: 5 [IU] via SUBCUTANEOUS
  Administered 2020-10-10 (×2): 2 [IU] via SUBCUTANEOUS
  Administered 2020-10-10: 3 [IU] via SUBCUTANEOUS
  Administered 2020-10-11: 2 [IU] via SUBCUTANEOUS
  Filled 2020-10-08 (×12): qty 1

## 2020-10-08 MED ORDER — POTASSIUM CHLORIDE 20 MEQ PO PACK
40.0000 meq | PACK | Freq: Once | ORAL | Status: AC
  Administered 2020-10-08: 40 meq
  Filled 2020-10-08: qty 2

## 2020-10-08 MED ORDER — VITAL AF 1.2 CAL PO LIQD
1000.0000 mL | ORAL | Status: DC
  Administered 2020-10-08 – 2020-10-10 (×3): 1000 mL

## 2020-10-08 NOTE — Progress Notes (Signed)
Pt to and from MRI this shift. Pt failed WUA due to de-saturation to 79% on ventilator. Re-sedated and ventilator settings adjusted by RT. Pt still on levophed. 1 unit PRBCs administered this shift.

## 2020-10-08 NOTE — Progress Notes (Signed)
Portable EEG completed, results pending. 

## 2020-10-08 NOTE — Progress Notes (Signed)
Initial Nutrition Assessment  DOCUMENTATION CODES:   Non-severe (moderate) malnutrition in context of social or environmental circumstances  INTERVENTION:   Initiate Vital 1.2 @55ml /hr + ProSource 64ml BID via tube   Free water flushes 63ml q4 hours to maintain tube patency   Propofol: 14.28 ml/hr- provides 377kcal/day   Regimen provides 2041kcal/day, 121g/day protein and 1269ml/day free water   Pt at high refeed risk; recommend monitor potassium, magnesium and phosphorus labs daily until stable  NUTRITION DIAGNOSIS:   Moderate Malnutrition related to social / environmental circumstances (subtance abuse) as evidenced by mild fat depletion,moderate fat depletion,mild muscle depletion,moderate muscle depletion.  GOAL:   Patient will meet greater than or equal to 90% of their needs  MONITOR:   PO intake,Supplement acceptance,Labs,Weight trends,Skin,I & O's  REASON FOR ASSESSMENT:   Consult Enteral/tube feeding initiation and management  ASSESSMENT:   45 y.o. caucasian male with medical history significant for chronic C. difficile, type II diabetes mellitus, hypertension, hepatitis C, dyslipidemia, substance abuse and alcohol abuse who presented to the emergency room with acute onset of hyperglycemia with altered mental status with mild confusion. Pt found to have DKA s/p PEA arrest 4/10 requiring intubation and ventilation   Pt sedated and ventilated. OGT in place. Plan is to start tube feeds today. Pt is at high refeed risk. Per chart, pt eating 100% of meals prior to becoming ventilated. Per chart, pt appears weight stable at baseline.   Medications reviewed and include: folic acid, insulin, lactobacillus, MVI, thiamine, vancomycin, unasyn, pepcid, LRS @100ml /hr, levophed, KPhos, propofol  Labs reviewed: K 3.0(L), P 1.8(L), Mg 1.8 wnl Hgb 7.0(L), Hct 21.0(L), MCV 106.6(H), MCH 35.5(H) cbgs- 320, 277, 61, 96, 123 x 24 hrs AIC 11.1(H)- 4/9   [name] currently intubated  on ventilator support MV: 8.0 L/min Temp (24hrs), Avg:98.5 F (36.9 C), Min:98 F (36.7 C), Max:99 F (37.2 C)  Propofol: 14.28 ml/hr- provides 377kcal/day   MAP- >  UOP- 6/9  NUTRITION - FOCUSED PHYSICAL EXAM:  Flowsheet Row Most Recent Value  Orbital Region No depletion  Upper Arm Region Moderate depletion  Thoracic and Lumbar Region Mild depletion  Buccal Region No depletion  Temple Region No depletion  Clavicle Bone Region Mild depletion  Clavicle and Acromion Bone Region Mild depletion  Scapular Bone Region No depletion  Dorsal Hand No depletion  Patellar Region Moderate depletion  Anterior Thigh Region Moderate depletion  Posterior Calf Region Moderate depletion  Edema (RD Assessment) None  Hair Reviewed  Eyes Reviewed  Mouth Reviewed  Skin Reviewed  Nails Reviewed     Diet Order:   Diet Order            Diet NPO time specified  Diet effective now                EDUCATION NEEDS:   No education needs have been identified at this time  Skin:  Skin Assessment: Reviewed RN Assessment (Stage II buttocks)  Last BM:  4/11- type 7  Height:   Ht Readings from Last 1 Encounters:  10/06/20 5\' 11"  (1.803 m)    Weight:   Wt Readings from Last 1 Encounters:  10/06/20 68 kg    Ideal Body Weight:  78 kg  BMI:  Body mass index is 20.92 kg/m.  Estimated Nutritional Needs:   Kcal:  1778kcal/day  Protein:  100-120g/day  Fluid:  2.1-2.4L/day  12/06/20 MS, RD, LDN Please refer to [facility] for RD and/or RD on-call/weekend/after hours pager

## 2020-10-08 NOTE — Progress Notes (Signed)
Inpatient Diabetes Program Recommendations  AACE/ADA: New Consensus Statement on Inpatient Glycemic Control (2015)  Target Ranges:  Prepandial:   less than 140 mg/dL      Peak postprandial:   less than 180 mg/dL (1-2 hours)      Critically ill patients:  140 - 180 mg/dL   Results for JENNIE, BOLAR (MRN [id number]) as of 10/08/2020 07:25  Ref. Range 10/07/2020 03:06 10/07/2020 03:53 10/07/2020 04:39 10/07/2020 05:04 10/07/2020 05:59 10/07/2020 07:18 10/07/2020 07:53 10/07/2020 09:03 10/07/2020 09:58 10/07/2020 11:32 10/07/2020 13:52 10/07/2020 15:59 10/07/2020 19:25 10/07/2020 23:03 10/08/2020 00:53 10/08/2020 02:54  Glucose-Capillary Latest Ref Range: 70 - 99 mg/dL 21 (LL) 494 (H) 496 (H) 100 (H) 104 (H) 71 71 91 102 (H) 90 123 (H) 164 (H) 227 (H) 262 (H) 320 (H)  10 units NOVOLOG  277 (H)  8 units NOVOLOG     Home DM Meds: Lantus 30 units QHS       Humalog 10 units TID per SSI  Current Orders: Novolog Moderate Correction Scale/ SSI (0-15 units) Q4 hours     Code Blue event noted early AM 4/10--Intubated  MD- Note Hypoglycemic event 3am on 4/10.  CBGs now elevated this AM  May consider adding weight based Basal Insulin to hospital regimen--Of note pt received 30 units Lantus (home dose) prior to HYPO event on early 4/10  Recommend Lantus 6 units BID to start (0.2 units/kg)    --Will follow patient during hospitalization--  Ambrose Finland RN, MSN, CDE Diabetes Coordinator Inpatient Glycemic Control Team Team Pager: [phone] (8a-5p)

## 2020-10-08 NOTE — Progress Notes (Signed)
NAME:  Tony Long, MRN:  [id number], DOB:  1976-05-21, LOS: 3 ADMISSION DATE:  Oct 26, 2020, CONSULTATION DATE:  10/07/20 REFERRING MD:  Dr. Arville Care, CHIEF COMPLAINT:  Hyperglycemia  History of Present Illness:  45 year old male presented to the ED via EMS due to confusion and high blood sugar.  Per ED documentation EMS reported patient took insulin prior to their arrival and on presentation to the ED reported that he, " feels fine". ED course:  On 26-Oct-2020 patient was hypotensive with initial BP of 77/54, improving with IV fluid resuscitation.  Other vital signs stable: T 97.5, HR 93, RR 14 & SPO2 100% on room air. Significant labs: Severely hypokalemic at <2.0, chloride 90, high anion gap metabolic acidosis with a serum CO2 of 18 & AG of 27, glucose 521, lactic acidosis at greater than 11 > 10.4 & hemoglobin 8.3.  UA revealed greater than 500 Patient was given a dose of cefepime and Flagyl as well as potassium replacement and IV fluid resuscitation.  He was also started on an IV insulin drip per Endo tool protocol for DKA. Patient was admitted to stepdown.   On 10/06/2020 patient was transferred to telemetry unit after being transitioned off of insulin drip per Endo tool protocol for DKA.  At midnight care nurse said she saw the patient resting in his room.  Around 3 AM nurse noted the patient's heart rate was bradycardic in the 30s and trending down, upon arrival to the room the patient had no pulse.  CPR was initiated and CODE BLUE team was called, first pulse check demonstrated asystole and CPR was continued. He received 2 doses of epinephrine, 1 amp of bicarb, 1 amp of D50 since his CBG was 21.  The patient was intubated by the ED physician, and ROSC was obtained at approximately 03:13.  Patient was transferred to ICU and PCCM consulted for further management and monitoring. Pertinent  Medical History  Hypertension Hyperlipidemia Chronic C. Difficile (past 9 months) EtOH abuse (1/5 of liquor  daily) Transaminitis Remote IV drug use  Significant Hospital Events: Including procedures, antibiotic start and stop dates in addition to other pertinent events   . 10/26/2020-admit to stepdown on insulin drip for DKA . 10/06/2020-transferred to telemetry once transitioned off insulin drip . 10/07/2020-patient found in PEA, CODE BLUE called: Patient intubated requiring mechanical ventilation and ROSC achieved with transfer to ICU. . 10/08/2020- Critically ill, MRI Brain and EEG pending today, Hbg 7.0, transfuse 1 unit pRBCs  Interim History / Subjective:  No acute events reported overnight Afebrile, requiring 3 mcg Levophed Urine output 1.9L last 24 hrs (+7.8 L since admit) Unresponsive (on Propofol infusion), Cough/gag/corneal reflexes present MRI Brain and EEG to be performed today Hgb 7.0 this morning, will transfuse 1 unit pRBCs   Objective   Blood pressure 97/72, pulse 78, temperature 98 F (36.7 C), temperature source Axillary, resp. rate 14, height 5\' 11"  (1.803 m), weight 68 kg, SpO2 97 %.    Vent Mode: PRVC FiO2 (%):  [21 %-40 %] 21 % Set Rate:  [18 bmp] 18 bmp Vt Set:  [450 mL] 450 mL PEEP:  [5 cmH20] 5 cmH20 Plateau Pressure:  [15 cmH20-16 cmH20] 15 cmH20   Intake/Output Summary (Last 24 hours) at 10/08/2020 0817 Last data filed at 10/08/2020 0810 Gross per 24 hour  Intake 5526.05 ml  Output 2700 ml  Net 2826.05 ml   Filed Weights   Oct 26, 2020 1941 10/06/20 0604 10/06/20 1445  Weight: 68 kg 63.5 kg 68 kg  Examination: General: Critically ill appearing male, laying in bed, intubated and sedated, in NAD HEENT: Atraumatic, normocephalic, neck supple, no JVD, ETT in place Neuro: Unresponsive (currently on propofol), unable to follow commands, Cough/gag/corneal reflexes intact, PERRL +3 brisk CV: RRR, s1s2, no M/R/G Pulm: Coarse breath sounds, overbreathing the vent, even, nonlabored GI: soft, rounded, nontender, no guarding or rebound tenderness, BS+ x4 GU: Foley  catheter in place with yellow urine Skin: Scattered abrasions and ecchymosis noted especially bilateral hands Extremities: Warm and dry. 2+ radial pulses, 1+ DP pulses, no edema  Labs/imaging that I have personally reviewed  (right click and "Reselect all SmartList Selections" daily)  Labs 10/08/20: K 3.0, Glucose 151, PCT 1.47, WBC 5.0, Hgb 7.0, HCT 21, MCV 106, MCH 35.5, MCHC 33.3, RDW 15.8, Platelets 55 CT Head 10/07/20>>1. No acute finding or visible anoxic injury. 2. Brain atrophy for age. CXR 10/07/20>>Interval right jugular catheter with its tip in the superior vena cava. No pneumothorax. Endotracheal tube in satisfactory position. Nasogastric tube tip and side hole in the proximal stomach. Stable borderline enlarged cardiac silhouette. Interval linear atelectasis at the left lung base. Otherwise, clear lungs. Echocardiogram 10/07/20>> LVEF 60-65%, LV diastolic parameters normal, RV systolic function mildly reduced, no significant valve disease  Resolved Hospital Problem list     Assessment & Plan:  Cardiac arrest: initial rhythm PEA, first rhythm check post CPR aystole  In the setting of hypokalemia & hypoglycemia PMHx: ETOH abuse, T2DM, HLD, HTN  Estimated 13 minutes downtime, CPR initiated immediately (however last seen well 3 hours earlier- found in PEA, received 0 defibrillations, suspected anoxic injury. CBG 21 Patient deemed not a candidate for TTM due to unclear downtime prior to CPR and co-morbidities - Continuous cardiac monitoring and serial EKG's as needed - Vasopressors PRN, to maintain MAP > 65 - Echocardiogram 10/07/20>> LVEF 60-65%, LV diastolic parameters normal, RV systolic function mildly reduced, no significant valve disease - Trend high-sensitivity troponin (68), will repeat troponin this morning -Lactic acidosis now resolved (4.6>>1.3)  Acute Hypoxic Respiratory Failure in the setting of PEA arrest - Ventilator settings: PRVC  8 mL/kg, 28% FiO2, 5 PEEP,  continue ventilator support & lung protective strategies - Wean PEEP & FiO2 as tolerated, maintain SpO2 > 90% - Head of bed elevated 30 degrees, VAP protocol in place - Plateau pressures less than 30 cm H20  - Intermittent chest x-ray & ABG PRN - Daily WUA with SBT as tolerated  - Ensure adequate pulmonary hygiene  - F/u cultures, trend PCT - Continue Aspiration Pna coverage: unasyn - bronchodilators PRN - PAD protocol in place: continue Fentanyl drip & Propofol drip  Concern for anoxic encephalopathy At risk for Alcohol withdrawal PMHx: ETOH abuse (drinks 1/5 of liquor daily) 13 minutes downtime, CPR initiated immediately (however last seen well 3 hours earlier- found in PEA, received 0 defibrillations, Initial head CT negative - PAD protocol in place: Propofol & fentanyl drip - RASS goal: 0, -1 - Daily wake up assessment - MRI Brain and EEG pending today 10/08/20 - Consider Neurology consult - CIWA protocol in place - high dose thiamine 500 mg x 5 days followed by 100 mg daily - folic acid & multi-vitamin daily - Daily BMP, replace electrolytes PRN  Hypoglycemia DKA this admission- resovled PMHx: T2DM -CBG's - Follow ICU hypo/hyper-glycemia protocol -Will add D5 infusion / Initiate tube feedings  C-difficile- chronic infection since 11/21 C-diff Ag +, toxin -, PCR + Patient has been treating for recurrent C-diff infection. Has outpatient appt for  [facility]cal [facility] - continue PO vancomycin - ID following appreciate input  Pancytopenia PMHx: chronic macrocytic anemia (02/2018) - Monitor for s/s of bleeding - Daily CBC - Transfuse for Hgb <7 - Monitor coag panel - Consider transfusion of platelets if < 10 or if active bleeding -Will transfuse 1 unit pRBCs today 10/08/20 for Hgb 7.0  Transaminitis Chronically elevated hepatic levels - trend hepatic function  Best practice (right click and "Reselect all SmartList Selections" daily)  Diet:  NPO, initiate tube  feedings Pain/Anxiety/Delirium protocol (if indicated): Yes (RASS goal -1) VAP protocol (if indicated): Yes DVT prophylaxis: SCD (no chemical prophylaxis due to thrombocytopenia and supra therapeutic INR) GI prophylaxis: PPI Glucose control:  SSI Yes Central venous access:  Yes, is still needed Arterial line:  N/A Foley:  Yes, and it is still needed Mobility:  bed rest  PT consulted: N/A Last date of multidisciplinary goals of care discussion: 10/08/20 Code Status:  full code Disposition: ICU  Labs   CBC: Recent Labs  Lab 09/28/2020 2005 10/06/20 0650 10/06/20 1915 10/07/20 0405 10/08/20 0450  WBC 4.6 3.3*  --  3.0* 5.0  HGB 8.3* 7.1* 7.6* 8.6* 7.0*  HCT 25.4* 20.7* 22.7* 26.2* 21.0*  MCV 107.6* 102.0*  --  106.9* 106.6*  PLT 156 104*  --  59* 55*    Basic Metabolic Panel: Recent Labs  Lab 10/06/20 0216 10/06/20 0650 10/06/20 1155 10/07/20 0405 10/07/20 1233 10/08/20 0450  NA 141 140 140 140  --  141  K <2.0* 2.6* 2.9* 3.0* 3.8 3.0*  CL 102 102 104 102  --  108  CO2 --  28  GLUCOSE 210* 180* 177* 151*  --  151*  BUN 5* 5* 6 6  --  6  CREATININE 0.83 0.70 0.72 0.74  --  0.74  CALCIUM 7.1* 7.1* 7.6* 7.6*  --  7.5*  MG 1.6*  --  2.3 2.1  --  1.8  PHOS  --  1.2*  --  4.3  --  1.8*   GFR: Estimated Creatinine Clearance: 113.3 mL/min (by C-G formula based on SCr of 0.74 mg/dL). Recent Labs  Lab 10/04/2020 2005 10/18/2020 2131 10/06/20 0216 10/06/20 0551 10/06/20 0650 10/07/20 0405 10/07/20 0409 10/07/20 1241 10/08/20 0450  PROCALCITON  --   --   --   --   --   --  0.79  --  1.47  WBC 4.6  --   --   --  3.3* 3.0*  --   --  5.0  LATICACIDVEN  --    < > 5.2* 1.8  --  4.6*  --  1.3  --    < > = values in this interval not displayed.    Liver Function Tests: Recent Labs  Lab 10/06/20 0216 10/07/20 0405  AST 50* 1,311*  ALT 29 270*  ALKPHOS 199* 268*  BILITOT 0.6 0.9  PROT 4.9* 5.1*  ALBUMIN 2.0* 2.2*   No results for input(s): LIPASE,  AMYLASE in the last 168 hours. Recent Labs  Lab 10/07/20 0401  AMMONIA 24    ABG    Component Value Date/Time   PHART 7.41 10/07/2020 0320   PCO2ART 41 10/07/2020 0320   PO2ART 379 (H) 10/07/2020 0320   HCO3 26.0 10/07/2020 0320   TCO2 21 (L) 05/27/2020 2051   ACIDBASEDEF 5.0 (H) 05/27/2020 2051   O2SAT 100.0 10/07/2020 0320     Coagulation Profile: Recent Labs  Lab 10/02/2020 2131 10/07/20  0405 10/07/20 1655  INR 1.2 1.5* 1.5*    Cardiac Enzymes: No results for input(s): CKTOTAL, CKMB, CKMBINDEX, TROPONINI in the last 168 hours.  HbA1C: HbA1c, POC (prediabetic range)  Date/Time Value Ref Range Status  02/29/2020 09:19 AM 9.1 (A) 5.7 - 6.4 % Final  03/28/2019 11:36 AM 11.5 (A) 5.7 - 6.4 % Final   HbA1c, POC (controlled diabetic range)  Date/Time Value Ref Range Status  02/29/2020 09:19 AM 9.1 (A) 0.0 - 7.0 % Final  03/28/2019 11:36 AM 11.5 (A) 0.0 - 7.0 % Final   HbA1c POC (<> result, manual entry)  Date/Time Value Ref Range Status  02/29/2020 09:19 AM 9.1 4.0 - 5.6 % Final  03/28/2019 11:36 AM 11.5 4.0 - 5.6 % Final   Hgb A1c MFr Bld  Date/Time Value Ref Range Status  10/06/2020 06:50 AM 11.1 (H) 4.8 - 5.6 % Final    Comment:    (NOTE) Pre diabetes:          5.7%-6.4%  Diabetes:              >6.4%  Glycemic control for   <7.0% adults with diabetes   10/06/2020 05:51 AM 11.1 (H) 4.8 - 5.6 % Final    Comment:    (NOTE) Pre diabetes:          5.7%-6.4%  Diabetes:              >6.4%  Glycemic control for   <7.0% adults with diabetes     CBG: Recent Labs  Lab 10/07/20 1925 10/07/20 2303 10/08/20 0053 10/08/20 0254 10/08/20 0741  GLUCAP 227* 262* 320* 277* 61*    Review of Systems:   UTA- intubated and sedated  Past Medical History:  He,  has a past medical history of Alcohol use (02/2020), Clostridioides difficile diarrhea (04/2020), Diabetes mellitus without complication (HCC), Elevated liver enzymes (02/2020), Elevated liver enzymes  (02/2020), Hyperlipidemia (02/2020), Hypertension, IV drug user, Vitamin D deficiency (02/2020), and Wound healing, delayed.   Surgical History:   Past Surgical History:  Procedure Laterality Date  . INCISION AND DRAINAGE ABSCESS N/A 03/22/2018   Procedure: INCISION AND DRAINAGE ABSCESS;  Surgeon: Violeta Gelinas, MD;  [facility];  Service: General;  Laterality: N/A;  . TEE WITHOUT CARDIOVERSION  03/22/2018   Procedure: TRANSESOPHAGEAL ECHOCARDIOGRAM (TEE);  Surgeon: Radiologist, Medication, MD;  Location: [facility] OR;  Service: Open Heart Surgery;;     Social History:   reports that he has been smoking cigarettes. He has been smoking about 1.00 pack per day. He has never used smokeless tobacco. He reports current alcohol use. He reports previous drug use. Drug: Cocaine.   Family History:  His family history includes Diabetes in his father and paternal uncle; Esophageal cancer in his maternal grandmother; Heart disease in his father. There is no history of Colon cancer, Pancreatic cancer, Liver disease, or Stomach cancer.   Allergies No Known Allergies   Home Medications  Prior to Admission medications   Medication Sig Start Date End Date Taking? Authorizing Provider  gabapentin (NEURONTIN) 300 MG capsule Take 2 capsule (600 mg= total) by mouth, 3 times a day. 05/01/20  Yes [name], FNP  glucose blood test strip Use as instructed 03/13/16  Yes Henrietta Hoover, NP  insulin glargine (LANTUS) 100 UNIT/ML Solostar Pen Inject 30 Units into the skin daily at 10 pm. 05/30/20  Yes Glade Lloyd, MD  insulin lispro (HUMALOG) 100 UNIT/ML injection Inject 0.1 mLs (10 Units total) into the  skin 3 (three) times daily with meals. PER SLIDING SCALE 05/30/20  Yes Glade Lloyd, MD  Lancets [facility] ULTRASOFT) lancets Use as instructed 03/13/16  Yes Henrietta Hoover, NP  vancomycin (VANCOCIN) 125 MG capsule Take one capsule 4 times daily for 2 weeks , Then tale one capsule twice daily for seven  days , then take one capsule every other day for 4 weeks. 09/12/20  Yes Zehr, Princella Pellegrini, PA-C     Critical care time: 38 minutes    Harlon Ditty, [facility] [place] [facility] Pager: [phone]

## 2020-10-09 ENCOUNTER — Inpatient Hospital Stay: Payer: Medicaid Other

## 2020-10-09 ENCOUNTER — Encounter: Payer: Self-pay | Admitting: Family Medicine

## 2020-10-09 DIAGNOSIS — G931 Anoxic brain damage, not elsewhere classified: Secondary | ICD-10-CM

## 2020-10-09 DIAGNOSIS — E0810 Diabetes mellitus due to underlying condition with ketoacidosis without coma: Secondary | ICD-10-CM | POA: Diagnosis not present

## 2020-10-09 DIAGNOSIS — J9601 Acute respiratory failure with hypoxia: Secondary | ICD-10-CM | POA: Diagnosis not present

## 2020-10-09 DIAGNOSIS — G9341 Metabolic encephalopathy: Secondary | ICD-10-CM | POA: Diagnosis not present

## 2020-10-09 LAB — PROCALCITONIN: Procalcitonin: 0.52 ng/mL

## 2020-10-09 LAB — CBC
HCT: 25.1 % — ABNORMAL LOW (ref 39.0–52.0)
Hemoglobin: 8.6 g/dL — ABNORMAL LOW (ref 13.0–17.0)
MCH: 35.1 pg — ABNORMAL HIGH (ref 26.0–34.0)
MCHC: 34.3 g/dL (ref 30.0–36.0)
MCV: 102.4 fL — ABNORMAL HIGH (ref 80.0–100.0)
Platelets: 53 10*3/uL — ABNORMAL LOW (ref 150–400)
RBC: 2.45 MIL/uL — ABNORMAL LOW (ref 4.22–5.81)
RDW: 17.5 % — ABNORMAL HIGH (ref 11.5–15.5)
WBC: 8.4 10*3/uL (ref 4.0–10.5)
nRBC: 0.4 % — ABNORMAL HIGH (ref 0.0–0.2)

## 2020-10-09 LAB — PHOSPHORUS: Phosphorus: 3.2 mg/dL (ref 2.5–4.6)

## 2020-10-09 LAB — COMPREHENSIVE METABOLIC PANEL
ALT: 100 U/L — ABNORMAL HIGH (ref 0–44)
AST: 158 U/L — ABNORMAL HIGH (ref 15–41)
Albumin: 1.8 g/dL — ABNORMAL LOW (ref 3.5–5.0)
Alkaline Phosphatase: 234 U/L — ABNORMAL HIGH (ref 38–126)
Anion gap: 5 (ref 5–15)
BUN: 6 mg/dL (ref 6–20)
CO2: 29 mmol/L (ref 22–32)
Calcium: 7.6 mg/dL — ABNORMAL LOW (ref 8.9–10.3)
Chloride: 111 mmol/L (ref 98–111)
Creatinine, Ser: 0.56 mg/dL — ABNORMAL LOW (ref 0.61–1.24)
GFR, Estimated: >60 mL/min (ref >60)
Glucose, Bld: 114 mg/dL — ABNORMAL HIGH (ref 70–99)
Potassium: 3.8 mmol/L (ref 3.5–5.1)
Sodium: 145 mmol/L (ref 135–145)
Total Bilirubin: 1.3 mg/dL — ABNORMAL HIGH (ref 0.3–1.2)
Total Protein: 4.8 g/dL — ABNORMAL LOW (ref 6.5–8.1)

## 2020-10-09 LAB — TYPE AND SCREEN
ABO/RH(D): O POS
Antibody Screen: NEGATIVE
Unit division: 0

## 2020-10-09 LAB — BPAM RBC
Blood Product Expiration Date: 202205082359
ISSUE DATE / TIME: 202204111546
Unit Type and Rh: 5100

## 2020-10-09 LAB — BLOOD GAS, ARTERIAL
Acid-Base Excess: 2.5 mmol/L — ABNORMAL HIGH (ref 0.0–2.0)
Bicarbonate: 27.2 mmol/L (ref 20.0–28.0)
FIO2: 0.8
MECHVT: 450 mL
Mechanical Rate: 18
O2 Saturation: 99.9 %
PIP: 12 cmH2O
Patient temperature: 37
RATE: 18 resp/min
pCO2 arterial: 42 mmHg (ref 32.0–48.0)
pH, Arterial: 7.42 (ref 7.350–7.450)
pO2, Arterial: 257 mmHg — ABNORMAL HIGH (ref 83.0–108.0)

## 2020-10-09 LAB — GLUCOSE, CAPILLARY
Glucose-Capillary: 93 mg/dL (ref 70–99)
Glucose-Capillary: 227 mg/dL — ABNORMAL HIGH (ref 70–99)
Glucose-Capillary: 112 mg/dL — ABNORMAL HIGH (ref 70–99)
Glucose-Capillary: 198 mg/dL — ABNORMAL HIGH (ref 70–99)
Glucose-Capillary: 165 mg/dL — ABNORMAL HIGH (ref 70–99)

## 2020-10-09 LAB — CULTURE, RESPIRATORY W GRAM STAIN: Culture: NORMAL

## 2020-10-09 LAB — MAGNESIUM: Magnesium: 1.9 mg/dL (ref 1.7–2.4)

## 2020-10-09 LAB — PROTIME-INR
INR: 1.2 (ref 0.8–1.2)
Prothrombin Time: 14.6 seconds (ref 11.4–15.2)

## 2020-10-09 MED ORDER — IOHEXOL 350 MG/ML SOLN
75.0000 mL | Freq: Once | INTRAVENOUS | Status: AC | PRN
  Administered 2020-10-09: 75 mL via INTRAVENOUS

## 2020-10-09 MED ORDER — NOREPINEPHRINE 4 MG/250ML-% IV SOLN: 0.0000 ug/min | INTRAVENOUS | Status: DC

## 2020-10-09 MED ORDER — NOREPINEPHRINE 4 MG/250ML-% IV SOLN
INTRAVENOUS | Status: AC
  Administered 2020-10-09: 2 ug/min via INTRAVENOUS
  Filled 2020-10-09: qty 250

## 2020-10-09 MED ORDER — ENOXAPARIN SODIUM 80 MG/0.8ML ~~LOC~~ SOLN
1.0000 mg/kg | Freq: Two times a day (BID) | SUBCUTANEOUS | Status: DC
  Administered 2020-10-09 – 2020-10-13 (×8): 67.5 mg via SUBCUTANEOUS
  Filled 2020-10-09 (×8): qty 0.8

## 2020-10-09 MED ORDER — IOHEXOL 350 MG/ML SOLN: 75.0000 mL | Freq: Once | INTRAVENOUS | Status: DC | PRN

## 2020-10-09 MED ORDER — DEXMEDETOMIDINE HCL IN NACL 400 MCG/100ML IV SOLN
0.4000 ug/kg/h | INTRAVENOUS | Status: DC
  Administered 2020-10-09: 0.4 ug/kg/h via INTRAVENOUS
  Administered 2020-10-10: 0.6 ug/kg/h via INTRAVENOUS
  Filled 2020-10-09 (×2): qty 100

## 2020-10-09 NOTE — Progress Notes (Signed)
Small PE's on CTA: fine to treat, see discussion today. Pulse ox on R ear also gave much better/accurate readings so have been able to come down rapidly on FiO2.  SAT performed Wakes up, follows commands x 4. Still lots of secretions and desats on SBT.  Would be borderline for end of shift extubation. May be able to give trial in AM if doing well.  Consider tracheal aspirate culture if secretions still bad in AM.  Myrla Halsted MD PCCM

## 2020-10-09 NOTE — Progress Notes (Addendum)
NAME:  Tony Long, MRN:  [id number], DOB:  09/01/1975, LOS: 4 ADMISSION DATE:  10/27/2020, CONSULTATION DATE:  10/07/20 REFERRING MD:  Dr. Sidney Ace, CHIEF COMPLAINT:  Hyperglycemia  History of Present Illness:  45 year old male presented to the ED via EMS due to confusion and high blood sugar.  Per ED documentation EMS reported patient took insulin prior to their arrival and on presentation to the ED reported that he, " feels fine". ED course:  On 10/18/2020 patient was hypotensive with initial BP of 77/54, improving with IV fluid resuscitation.  Other vital signs stable: T 97.5, HR 93, RR 14 & SPO2 100% on room air. Significant labs: Severely hypokalemic at <2.0, chloride 90, high anion gap metabolic acidosis with a serum CO2 of 18 & AG of 27, glucose 521, lactic acidosis at greater than 11 > 10.4 & hemoglobin 8.3.  UA revealed greater than 500 Patient was given a dose of cefepime and Flagyl as well as potassium replacement and IV fluid resuscitation.  He was also started on an IV insulin drip per Endo tool protocol for DKA. Patient was admitted to stepdown.   On 10/06/2020 patient was transferred to telemetry unit after being transitioned off of insulin drip per Endo tool protocol for DKA.  At midnight care nurse said she saw the patient resting in his room.  Around 3 AM nurse noted the patient's heart rate was bradycardic in the 30s and trending down, upon arrival to the room the patient had no pulse.  CPR was initiated and CODE BL[facility] team was called, first pulse check demonstrated asystole and CPR was continued. He received 2 doses of epinephrine, 1 amp of bicarb, 1 amp of D50 since his CBG was 21.  The patient was intubated by the ED physician, and ROSC was obtained at approximately 03:13.  Patient was transferred to ICU and PCCM consulted for further management and monitoring. Pertinent  Medical History  Hypertension Hyperlipidemia Chronic C. Difficile (past 9 months) EtOH abuse (1/5 of liquor  daily) Transaminitis Remote IV drug use  Significant Hospital Events: Including procedures, antibiotic start and stop dates in addition to other pertinent events   . 09/28/2020-admit to stepdown on insulin drip for DKA . 10/06/2020-transferred to telemetry once transitioned off insulin drip . 10/07/2020-patient found in PEA, CODE BLUE called: Patient intubated requiring mechanical ventilation and ROSC achieved with transfer to ICU. . 10/08/2020- Critically ill, MRI Brain and EEG pending today, Hbg 7.0, transfuse 1 unit pRBCs . 10/09/2020- MRI yesterday with numerous small bilateral cerebral hemisphere infarcts, Neurology consulted, Hgb 8.6 s/p 1 unit pRBC yesterday, off Levophed, CTA Chest pending due to increased FiO2 requirements with clear CXR  Interim History / Subjective:  -MRI Brain yesterday  with numerous small bilateral cerebral hemisphere infarcts, Neurology consulted - EEG performed yesterday, results pending  -Hgb 8.6 s/p 1 unit pRBC yesterday -Failed WUA yesterday due to desaturations to 79% -Due to increased FiO2 requirements with clear CXR, along with RV systolic function reduced and RV slightly enlarged ~ obtain CTA Chest -Levophed has been weaned off -Afebrile, hemodynamically stable -Sedated on Propofol, will plan for WUA and SBT today -Urine output 1.3L yesterday (+ 7.8L since admit)    Objective   Blood pressure 99/72, pulse 92, temperature 98.6 F (37 C), temperature source Axillary, resp. rate 20, height '5\' 11"'  (1.803 m), weight 68 kg, SpO2 97 %.    Vent Mode: PRVC FiO2 (%):  [40 %-70 %] 60 % Set Rate:  [18 bmp] 18 bmp Vt  Set:  [450 mL] 450 mL PEEP:  [5 cmH20-12 cmH20] 12 cmH20 Plateau Pressure:  [13 cmH20] 13 cmH20   Intake/Output Summary (Last 24 hours) at 10/09/2020 0919 Last data filed at 10/09/2020 0800 Gross per 24 hour  Intake 2439.67 ml  Output 2590 ml  Net -150.33 ml   Filed Weights   10/12/2020 1941 10/06/20 0604 10/06/20 1445  Weight: 68 kg 63.5 kg  68 kg    Examination: General: Critically ill appearing male, laying in bed, intubated and sedated, in NAD HEENT: Atraumatic, normocephalic, neck supple, no JVD, ETT in place, MM moist Neuro: Sedated on Propofol, unable to follow commands, Cough/gag/corneal reflexes intact, PERRL +3 sluggish CV: RRR, s1s2, no M/R/G, 2+ radial pulses, 1+ DP pulses Pulm: Mechanical breath sounds, no wheezing or rales noted, overbreathing the vent, even, no accessory muscle use GI: soft, rounded, nontender, no guarding or rebound tenderness, BS+ x4 GU: Foley catheter in place with yellow urine Skin: Scattered abrasions and ecchymosis noted especially bilateral hands Extremities: Warm and dry. Normal bulk and tone, no deformities, no edema  Labs/imaging that I have personally reviewed  (right click and "Reselect all SmartList Selections" daily)  Labs 10/09/20: BUN 6, Cr. 0.56, Alk Phosphatase 234, albumin 1.8, AST 158, ALT 100, PCT 0.52, WBC 8.4, Hgb 8.65, HCT 25.1, Platelets 53 CXR 10/09/20>>Endotracheal tube 5.0 cm above the carina, nasogastric tube looped within the mid body of the stomach, and right internal jugular central venous catheter with its tip within the superior vena cava are unchanged. Pulmonary insufflation is stable. Mild bibasilar atelectasis. No superimposed confluent pulmonary infiltrate. No pneumothorax or pleural effusion. Cardiac size within normal limits. Pulmonary vascularity is normal. MRI Brain 10/09/20>>Numerous small acute/early subacute infarcts (measuring up to 6 mm) within the bilateral cerebral hemispheres, within multiple vascular territories. The infarcts predominantly affect the cerebral white matter. The distribution is highly suspicious for embolic type infarcts. However, given the provided history, there may be superimposed small foci of watershed ischemia. Background mild cerebral white matter chronic small vessel ischemic disease. Mild cerebral and cerebellar  atrophy, advanced for age. Mild paranasal sinus mucosal thickening at the imaged levels. Bilateral mastoid effusions. Nonspecific 17 mm round T2 hyperintense lesion, also demonstrating restricted diffusion, within the left maxillofacial soft tissues. This lesion is incompletely assessed on the current exam. Recommend direct visualization. Additionally, a dedicated maxillofacial CT with contrast may be considered for further evaluation. CT Head 10/07/20>>1. No acute finding or visible anoxic injury. 2. Brain atrophy for age. Echocardiogram 10/07/20>> LVEF 50-27%, LV diastolic parameters normal, RV systolic function mildly reduced, no significant valve disease  Resolved Hospital Problem list     Assessment & Plan:  Cardiac arrest: initial rhythm PEA, first rhythm check post CPR aystole  In the setting of hypokalemia & hypoglycemia PMHx: ETOH abuse, T2DM, HLD, HTN  Estimated 13 minutes downtime, CPR initiated immediately (however last seen well 3 hours earlier- found in PEA, received 0 defibrillations, suspected anoxic injury. CBG 21 Patient deemed not a candidate for TTM due to unclear downtime prior to CPR and co-morbidities - Continuous cardiac monitoring and serial EKG's as needed - Vasopressors PRN, to maintain MAP > 65 - Echocardiogram 10/07/20>> LVEF 74-12%, LV diastolic parameters normal, RV systolic function mildly reduced, no significant valve disease - Trend high-sensitivity troponin (68 ~ 94) -Lactic acidosis now resolved   Acute Hypoxic Respiratory Failure in the setting of PEA arrest - Ventilator settings: PRVC  8 mL/kg, 28% FiO2, 5 PEEP, continue ventilator support & lung protective strategies -  Wean PEEP & FiO2 as tolerated, maintain SpO2 > 90% - Head of bed elevated 30 degrees, VAP protocol in place - Plateau pressures less than 30 cm H20  - Intermittent chest x-ray & ABG PRN - Daily WUA with SBT as tolerated  - Ensure adequate pulmonary hygiene  - F/u cultures, trend  PCT - Continue Aspiration Pna coverage: unasyn - bronchodilators PRN - PAD protocol in place: continue Fentanyl drip & Propofol drip -Due to increased FiO2 requirements with clear CXR, along with RV systolic function reduced and RV slightly enlarged ~ obtain CTA Chest 10/09/20  Concern for anoxic encephalopathy Acute Ischemic CVA At risk for Alcohol withdrawal PMHx: ETOH abuse (drinks 1/5 of liquor daily) 13 minutes downtime, CPR initiated immediately (however last seen well 3 hours earlier- found in PEA, received 0 defibrillations, Initial head CT negative - PAD protocol in place: Propofol & fentanyl drip - RASS goal: 0, -1 - Daily wake up assessment - MRI Brain 4/11 with numerous small acute/early subacute infarcts within bilateral cerebral hemispheres (embolic vs. Watershed ischemia) - Neurology following, appreciate input - EEG results pending - CIWA protocol in place - high dose thiamine 500 mg x 5 days followed by 412 mg daily - folic acid & multi-vitamin daily - Daily BMP, replace electrolytes PRN  Hypoglycemia- resolved DKA this admission- resovled PMHx: T2DM - CBG's - SSI - Follow ICU hypo/hyper-glycemia protocol  C-difficile- chronic infection since 11/21 C-diff Ag +, toxin -, PCR + Patient has been treating for recurrent C-diff infection. Has outpatient appt for fecal [facility] at [name] - continue PO vancomycin - ID following appreciate input  Pancytopenia PMHx: chronic macrocytic anemia (02/2018) - Monitor for s/s of bleeding - Daily CBC - Transfuse for Hgb <7 - Monitor coag panel - Consider transfusion of platelets if < 10 or if active bleeding  Transaminitis Chronically elevated hepatic levels - trend hepatic function  Best practice (right click and "Reselect all SmartList Selections" daily)  Diet:  NPO, tube feedings Pain/Anxiety/Delirium protocol (if indicated): Yes (RASS goal -1) VAP protocol (if indicated): Yes DVT prophylaxis: SCD (no chemical  prophylaxis due to thrombocytopenia) GI prophylaxis: PPI Glucose control:  SSI Yes Central venous access:  Yes, is still needed Arterial line:  N/A Foley:  Yes, and it is still needed Mobility:  bed rest  PT consulted: N/A Last date of multidisciplinary goals of care discussion: 10/09/20 Code Status:  full code Disposition: ICU  Labs   CBC: Recent Labs  Lab 10/10/2020 2005 10/06/20 0650 10/06/20 1915 10/07/20 0405 10/08/20 0450 10/08/20 2000 10/09/20 0435  WBC 4.6 3.3*  --  3.0* 5.0  --  8.4  HGB 8.3* 7.1* 7.6* 8.6* 7.0* 8.8* 8.6*  HCT 25.4* 20.7* 22.7* 26.2* 21.0* 26.5* 25.1*  MCV 107.6* 102.0*  --  106.9* 106.6*  --  102.4*  PLT 156 104*  --  59* 55*  --  53*    Basic Metabolic Panel: Recent Labs  Lab 10/06/20 0216 10/06/20 0650 10/06/20 1155 10/07/20 0405 10/07/20 1233 10/08/20 0450 10/08/20 2000 10/09/20 0435  NA 141 140 140 140  --  141  --  145  K <2.0* 2.6* 2.9* 3.0* 3.8 3.0*  --  3.8  CL 102 102 104 102  --  108  --  111  CO2 '26 31 29 27  ' --  28  --  29  GLUCOSE 210* 180* 177* 151*  --  151*  --  114*  BUN 5* 5* 6 6  --  6  --  6  CREATININE 0.83 0.70 0.72 0.74  --  0.74  --  0.56*  CALCIUM 7.1* 7.1* 7.6* 7.6*  --  7.5*  --  7.6*  MG 1.6*  --  2.3 2.1  --  1.8  --  1.9  PHOS  --  1.2*  --  4.3  --  1.8* 3.6 3.2   GFR: Estimated Creatinine Clearance: 113.3 mL/min (A) (by C-G formula based on SCr of 0.56 mg/dL (L)). Recent Labs  Lab 10/06/20 0216 10/06/20 0551 10/06/20 0650 10/07/20 0405 10/07/20 0409 10/07/20 1241 10/08/20 0450 10/09/20 0435  PROCALCITON  --   --   --   --  0.79  --  1.47 0.52  WBC  --   --  3.3* 3.0*  --   --  5.0 8.4  LATICACIDVEN 5.2* 1.8  --  4.6*  --  1.3  --   --     Liver Function Tests: Recent Labs  Lab 10/06/20 0216 10/07/20 0405 10/09/20 0435  AST 50* 1,311* 158*  ALT 29 270* 100*  ALKPHOS 199* 268* 234*  BILITOT 0.6 0.9 1.3*  PROT 4.9* 5.1* 4.8*  ALBUMIN 2.0* 2.2* 1.8*   No results for input(s): LIPASE,  AMYLASE in the last 168 hours. Recent Labs  Lab 10/07/20 0401  AMMONIA 24    ABG    Component Value Date/Time   PHART 7.41 10/07/2020 0320   PCO2ART 41 10/07/2020 0320   PO2ART 379 (H) 10/07/2020 0320   HCO3 26.0 10/07/2020 0320   TCO2 21 (L) 05/27/2020 2051   ACIDBASEDEF 5.0 (H) 05/27/2020 2051   O2SAT 100.0 10/07/2020 0320     Coagulation Profile: Recent Labs  Lab 10/16/2020 2131 10/07/20 0405 10/07/20 1655 10/09/20 0435  INR 1.2 1.5* 1.5* 1.2    Cardiac Enzymes: No results for input(s): CKTOTAL, CKMB, CKMBINDEX, TROPONINI in the last 168 hours.  HbA1C: HbA1c, POC (prediabetic range)  Date/Time Value Ref Range Status  02/29/2020 09:19 AM 9.1 (A) 5.7 - 6.4 % Final  03/28/2019 11:36 AM 11.5 (A) 5.7 - 6.4 % Final   HbA1c, POC (controlled diabetic range)  Date/Time Value Ref Range Status  02/29/2020 09:19 AM 9.1 (A) 0.0 - 7.0 % Final  03/28/2019 11:36 AM 11.5 (A) 0.0 - 7.0 % Final   HbA1c POC (<> result, manual entry)  Date/Time Value Ref Range Status  02/29/2020 09:19 AM 9.1 4.0 - 5.6 % Final  03/28/2019 11:36 AM 11.5 4.0 - 5.6 % Final   Hgb A1c MFr Bld  Date/Time Value Ref Range Status  10/06/2020 06:50 AM 11.1 (H) 4.8 - 5.6 % Final    Comment:    (NOTE) Pre diabetes:          5.7%-6.4%  Diabetes:              >6.4%  Glycemic control for   <7.0% adults with diabetes   10/06/2020 05:51 AM 11.1 (H) 4.8 - 5.6 % Final    Comment:    (NOTE) Pre diabetes:          5.7%-6.4%  Diabetes:              >6.4%  Glycemic control for   <7.0% adults with diabetes     CBG: Recent Labs  Lab 10/08/20 1937 10/08/20 2159 10/08/20 2320 10/09/20 0404 10/09/20 0742  GLUCAP 128* 149* 147* 93 227*    Review of Systems:   UTA- intubated and sedated  Past Medical History:  He,  has a past  medical history of Alcohol use (02/2020), Clostridioides difficile diarrhea (04/2020), Diabetes mellitus without complication ([place]), Elevated liver enzymes (02/2020),  Elevated liver enzymes (02/2020), Hyperlipidemia (02/2020), Hypertension, IV drug user, Vitamin D deficiency (02/2020), and Wound healing, delayed.   Surgical History:   Past Surgical History:  Procedure Laterality Date  . INCISION AND DRAINAGE ABSCESS N/A 03/22/2018   Procedure: INCISION AND DRAINAGE ABSCESS;  Surgeon: Georganna Skeans, MD;  Location: [place];  Service: General;  Laterality: N/A;  . TEE WITHOUT CARDIOVERSION  03/22/2018   Procedure: TRANSESOPHAGEAL ECHOCARDIOGRAM (TEE);  Surgeon: Radiologist, Medication, MD;  Location: [place];  Service: Open Heart Surgery;;     Social History:   reports that he has been smoking cigarettes. He has been smoking about 1.00 pack per day. He has never used smokeless tobacco. He reports current alcohol use. He reports previous drug use. Drug: Cocaine.   Family History:  His family history includes Diabetes in his father and paternal uncle; Esophageal cancer in his maternal grandmother; Heart disease in his father. There is no history of Colon cancer, Pancreatic cancer, Liver disease, or Stomach cancer.   Allergies No Known Allergies   Home Medications  Prior to Admission medications   Medication Sig Start Date End Date Taking? Authorizing Provider  gabapentin (NEURONTIN) 300 MG capsule Take 2 capsule (600 mg= total) by mouth, 3 times a day. 05/01/20  Yes Azzie Glatter, FNP  glucose blood test strip Use as instructed 03/13/16  Yes Micheline Chapman, NP  insulin glargine (LANTUS) 100 UNIT/ML Solostar Pen Inject 30 Units into the skin daily at 10 pm. 05/30/20  Yes Aline August, MD  insulin lispro (HUMALOG) 100 UNIT/ML injection Inject 0.1 mLs (10 Units total) into the skin 3 (three) times daily with meals. PER SLIDING SCALE 05/30/20  Yes Aline August, MD  Lancets [facility] ULTRASOFT) lancets Use as instructed 03/13/16  Yes Micheline Chapman, NP  vancomycin (VANCOCIN) 125 MG capsule Take one capsule 4 times daily for 2 weeks , Then tale one  capsule twice daily for seven days , then take one capsule every other day for 4 weeks. 09/12/20  Yes Zehr, Laban Emperor, PA-C     Critical care time: 31 minutes    Darel Hong, [facility] [place] [facility] Pager: [phone]

## 2020-10-09 NOTE — Consult Note (Addendum)
NEUROLOGY CONSULTATION NOTE   Date of service: October 09, 2020 Patient Name: Tony Long MRN:  [id number] DOB:  05-26-1976 Reason for consult: "Strokes on MRI and concern for metabolic vs anoxic encephalopathy. _ _ _   _ __   _ __ _ _  __ __   _ __   __ _  History of Present Illness  Tony Long is a 45 y.o. male with PMH significant for EtOH use, DM2, Cdiff diarrhea, Hep C, HTN, HLD, IV Drug use, who initially presented on 4/8 with hyperglycemia, dehyration and confusion. Treated as DKA with uncontrolled DM2, recurrent Cdiff. He was placed on insulin gtt per Endo tool protocol for DKA and transitioned off it and transferred to floor.  At 0300 on 10/07/20, he was found to be unresponsive in PEA arrest with blood glucose of 21. CPR and given 1mg  of Epinephrine. ROSC achieved in 13 mins. He was intubated and transferred to the ICU. Downtime is somewhat unclear. Some concern for posturing was noted per notes. He did not undergo TTM.  In the ICU, he is on propofol 10mcg/Kg/min along with PRN Versed. MRI Brain was obtained and demonstrated numerous small acute/early subacute infarcts within BL cerebral hemispheres within multiple vascular territories. Neurology was consulted for concern for anoxic encephalopathy. rEEG is ordered and pending.   ROS  Unable to obtain ROS due to intubation and sedation. Past History   Past Medical History:  Diagnosis Date  . Alcohol use 02/2020  . Clostridioides difficile diarrhea 04/2020  . Diabetes mellitus without complication (HCC)   . Elevated liver enzymes 02/2020  . Elevated liver enzymes 02/2020  . Hyperlipidemia 02/2020  . Hypertension   . IV drug user   . Vitamin D deficiency 02/2020  . Wound healing, delayed    Past Surgical History:  Procedure Laterality Date  . INCISION AND DRAINAGE ABSCESS N/A 03/22/2018   Procedure: INCISION AND DRAINAGE ABSCESS;  Surgeon: 03/24/2018, MD;  Location: [facility] OR;  Service: General;  Laterality: N/A;  . TEE  WITHOUT CARDIOVERSION  03/22/2018   Procedure: TRANSESOPHAGEAL ECHOCARDIOGRAM (TEE);  Surgeon: Radiologist, Medication, MD;  Location: [facility] OR;  Service: Open Heart Surgery;;   Family History  Problem Relation Age of Onset  . Heart disease Father   . Diabetes Father   . Diabetes Paternal Uncle   . Esophageal cancer Maternal Grandmother   . Colon cancer Neg Hx   . Pancreatic cancer Neg Hx   . Liver disease Neg Hx   . Stomach cancer Neg Hx    Social History   Socioeconomic History  . Marital status: Single    Spouse name: Not on file  . Number of children: Not on file  . Years of education: Not on file  . Highest education level: Not on file  Occupational History  . Not on file  Tobacco Use  . Smoking status: Current Every Day Smoker    Packs/day: 1.00    Types: Cigarettes  . Smokeless tobacco: Never Used  Vaping Use  . Vaping Use: Never used  Substance and Sexual Activity  . Alcohol use: Yes    Comment: 1 fifth liquor per week; daily   . Drug use: Not Currently    Types: Cocaine    Comment: patient reports being abstinent since 03/2019  . Sexual activity: Yes    Partners: Female  Other Topics Concern  . Not on file  Social History Narrative  . Not on file   Social Determinants of Health  Financial Resource Strain: Not on file  Food Insecurity: Not on file  Transportation Needs: Not on file  Physical Activity: Not on file  Stress: Not on file  Social Connections: Not on file   No Known Allergies  Medications   Medications Prior to Admission  Medication Sig Dispense Refill Last Dose  . gabapentin (NEURONTIN) 300 MG capsule Take 2 capsule (600 mg= total) by mouth, 3 times a day. 120 capsule 6 Past Week at Unknown time  . glucose blood test strip Use as instructed 100 each 12 Past Week at Unknown time  . insulin glargine (LANTUS) 100 UNIT/ML Solostar Pen Inject 30 Units into the skin daily at 10 pm.   Past Week at Unknown time  . insulin lispro (HUMALOG) 100  UNIT/ML injection Inject 0.1 mLs (10 Units total) into the skin 3 (three) times daily with meals. PER SLIDING SCALE   Past Week at Unknown time  . Lancets (ONETOUCH ULTRASOFT) lancets Use as instructed 100 each 12 Past Week at Unknown time  . vancomycin (VANCOCIN) 125 MG capsule Take one capsule 4 times daily for 2 weeks , Then tale one capsule twice daily for seven days , then take one capsule every other day for 4 weeks. 90 capsule 0 Past Week at Unknown time     Vitals   Vitals:   10/09/20 0955 10/09/20 1000 10/09/20 1005 10/09/20 1037  BP:  (!) 85/56    Pulse:  99    Resp:  (!) 26    Temp:      TempSrc:      SpO2: (!) 89% (S) (!) 83% (!) 88% 93%  Weight:      Height:         Body mass index is 20.92 kg/m.  Physical Exam   General: Laying comfortably in bed; intubated. HENT: Normal oropharynx and mucosa. Normal external appearance of ears and nose. Neck: Supple, no pain or tenderness  CV: No JVD. No peripheral edema.  Pulmonary: Symmetric Chest rise. Normal respiratory effort.  Abdomen: Soft to touch, non-tender.  Ext: No cyanosis, edema, or deformity  Skin: No rash. Normal palpation of skin.   Musculoskeletal: Normal digits and nails by inspection. No clubbing.   Neurologic Examination on propofol 7mcg/Kg/min  Mental status/Cognition: No response to voice of loud clap. Grimaces to nares stimulation with very little movement of his R > L upper extremities. Brainstem reflexes: Corneals: Barely present BL and noted with recurrent stimulation. Pupils: 32mm BL and reactive to light. Dolls eyes: negative. Cough: Positive Gag: Positive Breathing over Vent.  Sensory/Motor:  No spontaneous movement noted. Minimal movement in RUE at the wrist/fingers noted to nares stimulation and minimal movement in LUE of the fingers noted to nares stimulation.  Reflexes:  Right Left Comments  Pectoralis      Biceps (C5/6) 1 1   Brachioradialis (C5/6) 1 1    Triceps (C6/7) 1 1     Patellar (L3/4) 0 0    Achilles (S1)      Hoffman      Plantar     Jaw jerk    Coordination/Complex Motor:  - unable to assess.  Labs   CBC:  Recent Labs  Lab 10/08/20 0450 10/08/20 2000 10/09/20 0435  WBC 5.0  --  8.4  HGB 7.0* 8.8* 8.6*  HCT 21.0* 26.5* 25.1*  MCV 106.6*  --  102.4*  PLT 55*  --  53*    Basic Metabolic Panel:  Lab Results  Component Value Date  NA 145 10/09/2020   K 3.8 10/09/2020   CO2 29 10/09/2020   GLUCOSE 114 (H) 10/09/2020   BUN 6 10/09/2020   CREATININE 0.56 (L) 10/09/2020   CALCIUM 7.6 (L) 10/09/2020   GFRNONAA >60 10/09/2020   GFRAA 143 02/29/2020   Lipid Panel:  Lab Results  Component Value Date   LDLCALC 50 02/29/2020   HgbA1c:  Lab Results  Component Value Date   HGBA1C 11.1 (H) 10/06/2020   Urine Drug Screen:     Component Value Date/Time   LABOPIA NONE DETECTED 04/07/2020 0922   LABOPIA NONE DETECTED 03/30/2018 0859   COCAINSCRNUR NONE DETECTED 04/07/2020 0922   LABBENZ NONE DETECTED 04/07/2020 0922   LABBENZ NONE DETECTED 03/30/2018 0859   AMPHETMU NONE DETECTED 04/07/2020 0922   AMPHETMU NONE DETECTED 03/30/2018 0859   THCU NONE DETECTED 04/07/2020 0922   THCU NONE DETECTED 03/30/2018 0859   LABBARB NONE DETECTED 04/07/2020 0922   LABBARB NONE DETECTED 03/30/2018 0859    Alcohol Level     Component Value Date/Time   [facility]  10/25/2008 1940    <5        LOWEST DETECTABLE LIMIT FOR SERUM ALCOHOL IS 5 mg/dL FOR MEDICAL PURPOSES ONLY    CT Head without contrast: CTH was negative for a large hypodensity concerning for a large territory infarct or hyperdensity concerning for an ICH  MRI Brain  Numerous small acute/early subacute infarcts (measuring up to 6 mm) within the bilateral cerebral hemispheres, within multiple vascular territories. The infarcts predominantly affect the cerebral white matter. The distribution is highly suspicious for embolic type infarcts. However, given the provided history, there may  be superimposed small foci of watershed ischemia.  Background mild cerebral white matter chronic small vessel ischemic disease.  Mild cerebral and cerebellar atrophy, advanced for age.  Mild paranasal sinus mucosal thickening at the imaged levels.  Bilateral mastoid effusions.  Nonspecific 17 mm round T2 hyperintense lesion, also demonstrating restricted diffusion, within the left maxillofacial soft tissues. This lesion is incompletely assessed on the current exam. Recommend direct visualization. Additionally, a dedicated maxillofacial CT with contrast may be considered for further evaluation.  rEEG:  pending  Impression   Tony Long is a 45 y.o. male admitted with DKA, recurrent Cdiff who had an inhospital PEA arrest, unclear downtime, but ACLS for 13 mins with ROSC. Poorly responsive post arrest and intubated and transferred to ICU. Did not undergo TTM. His neuro exam on propofol 82mcg/Kg/min was notable for intact brainstem reflexes but no evidence of higher cerebral function. MRI brain with no obvious hypoxic injury but notable for numerous small white matter infarcts in BL cerebral hemispheres. Will need to wean off sedation and examine off sedation for 24 hours to get an accurate assessment of his Neurologic status.  Recommendations  - rEEG is pending. - Agree with high dose thiamine replacement. Althou he was conversing prior to PEA arrest. - Avoid hypoglycemia, hypotension, hyperthermia. maintain euvolemia and eunatremia. - repeat Neuro exam once he is off sedation for 24 hours to get a better idea of his neurologic status. - Neurology will continue to follow along. ______________________________________________________________________   Thank you for the opportunity to take part in the care of this patient. If you have any further questions, please contact the neurology consultation attending.  Signed,  Erick Blinks [facility] Pager Number  [phone] _ _ _   _ __   _ __ _ _  __ __   _ __   __ _

## 2020-10-09 NOTE — Procedures (Addendum)
ROUTINE EEG REPORT REFERRING PROVIDER:  Collins Scotland, ARNP DATE OF STUDY:  10/08/2020 13:37 to 14:09  HISTORY: 44yo man status post cardiac arrest with encephalopathy.  DESCRIPTION: Initially on the recording the background consisted of bursts of low amplitude theta>alpha activity between 5-10 Hz lasting 1-5 seconds with diffuse attenuation (<10uV) between lasting 2-4 seconds.  As the recording progressed superimposed EMG activity was seen and the periods of attenuation subsided with an admixture of diffuse alpha and theta activity, and after stimulation generalized rhythmic delta activity was seen at 1 Hz (GRDA).  Reactivity was minimal, spontaneous variability was present. No clear sleep structures were seen. No persistent focal asymmetries or abnormalities were seen. No epileptiform discharges were recorded. No clinical or electrographic seizures were recorded.    CLASSIFICATION (EEG IMPRESSION): Abnormal significance III (obtunded) 1. Burst suppression -> continuous slow, generalized 2. Intermittent rhythmic slow, generalized  CLINICAL INTERPRETATION: There is evidence of a severe diffuse encephalopathy that is non-specific and can be seen with powerful sedating medications including fentanyl and propofol. There was no evidence to support a diagnosis of seizures on this recording.  Halo Laski A. Senaida Ores, MD Neurology and Clinical Neurophysiology

## 2020-10-09 NOTE — Progress Notes (Signed)
Pt maintained a RASS score of -3 throughout the entire shift, responds to pain and maintained an O2 stats above 95% throughout the entire shift with 70% FiO2, 12 of PEEP, and 450 tidal volume on vent settings. BP MAP goal >65 was maintained as well and pt is now off levo. Propofol is still running.

## 2020-10-10 ENCOUNTER — Inpatient Hospital Stay: Admit: 2020-10-10 | Payer: Medicaid Other

## 2020-10-10 ENCOUNTER — Inpatient Hospital Stay (HOSPITAL_COMMUNITY)
Admit: 2020-10-10 | Discharge: 2020-10-10 | Disposition: A | Discharge status: Home / Self Care | Payer: Medicaid Other | Attending: Internal Medicine | Admitting: Internal Medicine

## 2020-10-10 ENCOUNTER — Ambulatory Visit: Payer: Medicaid Other | Admitting: Internal Medicine

## 2020-10-10 DIAGNOSIS — E0810 Diabetes mellitus due to underlying condition with ketoacidosis without coma: Secondary | ICD-10-CM | POA: Diagnosis not present

## 2020-10-10 DIAGNOSIS — Q211 Atrial septal defect: Secondary | ICD-10-CM

## 2020-10-10 DIAGNOSIS — E43 Unspecified severe protein-calorie malnutrition: Secondary | ICD-10-CM

## 2020-10-10 DIAGNOSIS — R000 Tachycardia, unspecified: Secondary | ICD-10-CM

## 2020-10-10 DIAGNOSIS — E1110 Type 2 diabetes mellitus with ketoacidosis without coma: Secondary | ICD-10-CM | POA: Diagnosis not present

## 2020-10-10 DIAGNOSIS — J9601 Acute respiratory failure with hypoxia: Secondary | ICD-10-CM | POA: Diagnosis not present

## 2020-10-10 LAB — COMPREHENSIVE METABOLIC PANEL
ALT: 78 U/L — ABNORMAL HIGH (ref 0–44)
AST: 87 U/L — ABNORMAL HIGH (ref 15–41)
Albumin: 1.7 g/dL — ABNORMAL LOW (ref 3.5–5.0)
Alkaline Phosphatase: 234 U/L — ABNORMAL HIGH (ref 38–126)
Anion gap: 7 (ref 5–15)
BUN: 9 mg/dL (ref 6–20)
CO2: 26 mmol/L (ref 22–32)
Calcium: 7.7 mg/dL — ABNORMAL LOW (ref 8.9–10.3)
Chloride: 111 mmol/L (ref 98–111)
Creatinine, Ser: 0.68 mg/dL (ref 0.61–1.24)
GFR, Estimated: >60 mL/min (ref >60)
Glucose, Bld: 189 mg/dL — ABNORMAL HIGH (ref 70–99)
Potassium: 3 mmol/L — ABNORMAL LOW (ref 3.5–5.1)
Sodium: 144 mmol/L (ref 135–145)
Total Bilirubin: 1 mg/dL (ref 0.3–1.2)
Total Protein: 5 g/dL — ABNORMAL LOW (ref 6.5–8.1)

## 2020-10-10 LAB — CBC
HCT: 25.1 % — ABNORMAL LOW (ref 39.0–52.0)
Hemoglobin: 8.2 g/dL — ABNORMAL LOW (ref 13.0–17.0)
MCH: 34.6 pg — ABNORMAL HIGH (ref 26.0–34.0)
MCHC: 32.7 g/dL (ref 30.0–36.0)
MCV: 105.9 fL — ABNORMAL HIGH (ref 80.0–100.0)
Platelets: 67 10*3/uL — ABNORMAL LOW (ref 150–400)
RBC: 2.37 MIL/uL — ABNORMAL LOW (ref 4.22–5.81)
RDW: 17.6 % — ABNORMAL HIGH (ref 11.5–15.5)
WBC: 7.6 10*3/uL (ref 4.0–10.5)
nRBC: 0.5 % — ABNORMAL HIGH (ref 0.0–0.2)

## 2020-10-10 LAB — ECHOCARDIOGRAM LIMITED BUBBLE STUDY: S' Lateral: 2.8 cm

## 2020-10-10 LAB — CULTURE, BLOOD (ROUTINE X 2)
Culture: NO GROWTH
Culture: NO GROWTH
Special Requests: ADEQUATE
Special Requests: ADEQUATE

## 2020-10-10 LAB — GLUCOSE, CAPILLARY
Glucose-Capillary: 107 mg/dL — ABNORMAL HIGH (ref 70–99)
Glucose-Capillary: 122 mg/dL — ABNORMAL HIGH (ref 70–99)
Glucose-Capillary: 148 mg/dL — ABNORMAL HIGH (ref 70–99)
Glucose-Capillary: 183 mg/dL — ABNORMAL HIGH (ref 70–99)
Glucose-Capillary: 84 mg/dL (ref 70–99)
Glucose-Capillary: 91 mg/dL (ref 70–99)
Glucose-Capillary: 99 mg/dL (ref 70–99)

## 2020-10-10 LAB — PROTIME-INR
INR: 1.1 (ref 0.8–1.2)
Prothrombin Time: 14.6 seconds (ref 11.4–15.2)

## 2020-10-10 LAB — MAGNESIUM: Magnesium: 1.7 mg/dL (ref 1.7–2.4)

## 2020-10-10 MED ORDER — POTASSIUM CHLORIDE 10 MEQ/50ML IV SOLN
10.0000 meq | INTRAVENOUS | Status: AC
  Administered 2020-10-10 (×3): 10 meq via INTRAVENOUS
  Filled 2020-10-10 (×3): qty 50

## 2020-10-10 MED ORDER — DEXMEDETOMIDINE HCL IN NACL 400 MCG/100ML IV SOLN
0.1000 ug/kg/h | INTRAVENOUS | Status: DC
  Administered 2020-10-10: 0.1 ug/kg/h via INTRAVENOUS
  Filled 2020-10-10: qty 100

## 2020-10-10 MED ORDER — MAGNESIUM SULFATE 2 GM/50ML IV SOLN
2.0000 g | Freq: Once | INTRAVENOUS | Status: AC
  Administered 2020-10-10: 2 g via INTRAVENOUS
  Filled 2020-10-10: qty 50

## 2020-10-10 NOTE — Progress Notes (Signed)
eLink Physician-Brief Progress Note Patient Name: Tony Long DOB: September 08, 1975 MRN: [id number]   Date of Service  10/10/2020  HPI/Events of Note  Agitation - QTc interval = 0.477 seconds. Patient with history of ETOH.  eICU Interventions  Plan: 1. Low dose Precedex IV infusion (0.1 to 0.5 mcg/kg/hour). Titrate to RASS = 0. 2. Monitor QTc interval Q 4 hours. Notify MD if QTc interval > 0.5 seconds.      Intervention Category Major Interventions: Delirium, psychosis, severe agitation - evaluation and management  Randye Treichler [place] 10/10/2020, 11:04 PM

## 2020-10-10 NOTE — Progress Notes (Signed)
Pharmacy Antibiotic Note  Tony Long is a 45 y.o. male with PMH significant for EtOH use, DM2, Cdiff diarrhea, Hep C, HTN, HLD, IV Drug use, who initially presented on 4/8 with hyperglycemia, dehyration and confusion. Pharmacy has been consulted for Unasyn dosing for aspiration pneumonia. WBC WNL, Scr <1, afebrile. Blood culture have no growth; Urine culture with 4000 colonies Staph epi and 100 colonies E coli. [name] also positive for C diff and is on oral vancomycin.    Plan: Continue Unasyn 3 gm q6h x 5 days (stop date in place for 4/15) per indication and renal fxn.  Pharmacy will continue to follow and adjust/extend abx dosing if warranted.  Height: 5\' 11"  (180.3 cm) Weight: 68 kg (150 lb) IBW/kg (Calculated) : 75.3  Temp (24hrs), Avg:98.9 F (37.2 C), Min:98.2 F (36.8 C), Max:100.3 F (37.9 C)  Recent Labs  Lab 09/29/2020 2335 10/06/20 0216 10/06/20 0551 10/06/20 0650 10/06/20 1155 10/07/20 0405 10/07/20 1241 10/08/20 0450 10/09/20 0435 10/10/20 0326  WBC  --   --   --  3.3*  --  3.0*  --  5.0 8.4 7.6  CREATININE 0.87 0.83  --  0.70 0.72 0.74  --  0.74 0.56* 0.68  LATICACIDVEN 10.4* 5.2* 1.8  --   --  4.6* 1.3  --   --   --     Estimated Creatinine Clearance: 113.3 mL/min (by C-G formula based on SCr of 0.68 mg/dL).    No Known Allergies  Antimicrobials this admission: 4/10 Unasyn >>  4/11 Vancomycin (Oral) >>  4/08 Flagyl >> 4/9 4/08 Cefepime x1  Microbiology results: 4/8 BCx: NGTD 4/8 UCx: 4000 colonies staph epi, 100 colonies E coli 4/8 RespCx: negative 4/9 C Diff: Positive  Thank you for allowing pharmacy to be a part of this patient's care.  6/9, PharmD Pharmacy Resident  10/10/2020 10:53 AM

## 2020-10-10 NOTE — Progress Notes (Signed)
NAME:  Tony Long, MRN:  [id number], DOB:  August 02, 1975, LOS: 5 ADMISSION DATE:  09/28/2020, CONSULTATION DATE:  10/07/20 REFERRING MD:  Dr. Sidney Ace, CHIEF COMPLAINT:  Hyperglycemia  History of Present Illness:  45 year old male presented to the ED via EMS due to confusion and high blood sugar.  Per ED documentation EMS reported patient took insulin prior to their arrival and on presentation to the ED reported that he, " feels fine". ED course:  On 10/20/2020 patient was hypotensive with initial BP of 77/54, improving with IV fluid resuscitation.  Other vital signs stable: T 97.5, HR 93, RR 14 & SPO2 100% on room air. Significant labs: Severely hypokalemic at <2.0, chloride 90, high anion gap metabolic acidosis with a serum CO2 of 18 & AG of 27, glucose 521, lactic acidosis at greater than 11 > 10.4 & hemoglobin 8.3.  UA revealed greater than 500 Patient was given a dose of cefepime and Flagyl as well as potassium replacement and IV fluid resuscitation.  He was also started on an IV insulin drip per Endo tool protocol for DKA. Patient was admitted to stepdown.   On 10/06/2020 patient was transferred to telemetry unit after being transitioned off of insulin drip per Endo tool protocol for DKA.  At midnight care nurse said she saw the patient resting in his room.  Around 3 AM nurse noted the patient's heart rate was bradycardic in the 30s and trending down, upon arrival to the room the patient had no pulse.  CPR was initiated and CODE BLUE team was called, first pulse check demonstrated asystole and CPR was continued. He received 2 doses of epinephrine, 1 amp of bicarb, 1 amp of D50 since his CBG was 21.  The patient was intubated by the ED physician, and ROSC was obtained at approximately 03:13.  Patient was transferred to ICU and PCCM consulted for further management and monitoring. Pertinent  Medical History  Hypertension Hyperlipidemia Chronic C. Difficile (past 9 months) EtOH abuse (1/5 of liquor  daily) Transaminitis Remote IV drug use  Significant Hospital Events: Including procedures, antibiotic start and stop dates in addition to other pertinent events   . 09/28/2020-admit to stepdown on insulin drip for DKA . 10/06/2020-transferred to telemetry once transitioned off insulin drip . 10/07/2020-patient found in PEA, CODE BLUE called: Patient intubated requiring mechanical ventilation and ROSC achieved with transfer to ICU. . 10/08/2020- Critically ill, MRI Brain and EEG pending today, Hbg 7.0, transfuse 1 unit pRBCs . 10/09/2020- MRI yesterday with numerous small bilateral cerebral hemisphere infarcts, Neurology consulted, Hgb 8.6 s/p 1 unit pRBC yesterday, off Levophed, CTA Chest pending due to increased FiO2 requirements with clear CXR . 10/10/2020- EXTUBATED. CTA Chest yesterday with minor peripheral bilateral lower lobe PE's (no right heart strain), Venous US of BLE negative for DVT.  Obtain speech evaluation, remain in Stepdown today for close observation post extubation  Interim History / Subjective:  -No acute events reported overnight -CTA Chest yesterday with minor peripheral bilateral lower lobe PE's (no right heart strain) -Venous US of BLE negative for DVT -Afebrile, hemodynamically stable, NO Vasopressors -This morning with WUA & SBT able to follow commands and remain calm ~ successfully EXTUBATED to 2L nasal cannula -Obtain speech evaluation, remain in Stepdown today for close observation post extubation -Tracheal Aspirate from 10/07/20 consistent with NORMAL RESPIRATORY FLORA   Objective   Blood pressure 101/67, pulse 72, temperature 99.2 F (37.3 C), temperature source Axillary, resp. rate 14, height '5\' 11"'  (1.803 m), weight 68 kg,  SpO2 96 %.    Vent Mode: PRVC FiO2 (%):  [28 %-35 %] 28 % Set Rate:  [18 bmp] 18 bmp Vt Set:  [450 mL] 450 mL PEEP:  [5 cmH20] 5 cmH20 Plateau Pressure:  [15 cmH20] 15 cmH20   Intake/Output Summary (Last 24 hours) at 10/10/2020 1529 Last  data filed at 10/10/2020 1200 Gross per 24 hour  Intake 1860.35 ml  Output 1975 ml  Net -114.65 ml   Filed Weights   10/24/2020 1941 10/06/20 0604 10/06/20 1445  Weight: 68 kg 63.5 kg 68 kg    Examination: General: Acutely ill-appearing male, laying in bed, intubated with wake up assessment in progress, no acute distress HEENT: Atraumatic, normocephalic, neck supple, no JVD, ETT in place, MM moist Neuro: Wake-up trial in progress, awake and follows commands, pupils PERRLA CV: Regular rate and rhythm, S1-S2, no murmurs, rubs, gallops, 2+ distal pulses Pulm: Mechanical breath sounds, spontaneous breathing trial in progress, even, nonlabored GI: soft, rounded, nontender, no guarding or rebound tenderness, BS+ x4 GU: Foley catheter in place with yellow urine Skin: Scattered abrasions and ecchymosis noted especially bilateral hands Extremities: Warm and dry. Normal bulk and tone, no deformities, no edema  Labs/imaging that I have personally reviewed  (right click and "Reselect all SmartList Selections" daily)  Labs 10/10/20: K 3.0, glucose 189, Alk phos 234, AST 87, ALT 78, WBC 7.6, Hgb 8.2, HCT 25.1, Platelets 67 CTA Chest 10/10/20>>Very minor peripheral bilateral lower lobe pulmonary emboli. Very low thrombus burden. No signs of right heart strain. Proximal right lower lobe bronchi retained secretions versus mucous plugging with associated right lower consolidative airspace process/atelectasis. Pneumonia and or aspiration could have this Appearance. Minor left base atelectasis Trace pleural effusions Hepatic steatosis Venous US BLE 10/10/20>>No evidence of deep venous thrombosis in either lower extremity. MRI Brain 10/09/20>>Numerous small acute/early subacute infarcts (measuring up to 6 mm) within the bilateral cerebral hemispheres, within multiple vascular territories. The infarcts predominantly affect the cerebral white matter. The distribution is highly suspicious for embolic  type infarcts. However, given the provided history, there may be superimposed small foci of watershed ischemia. Background mild cerebral white matter chronic small vessel ischemic disease. Mild cerebral and cerebellar atrophy, advanced for age. Mild paranasal sinus mucosal thickening at the imaged levels. Bilateral mastoid effusions. Nonspecific 17 mm round T2 hyperintense lesion, also demonstrating restricted diffusion, within the left maxillofacial soft tissues. This lesion is incompletely assessed on the current exam. Recommend direct visualization. Additionally, a dedicated maxillofacial CT with contrast may be considered for further evaluation. CT Head 10/07/20>>1. No acute finding or visible anoxic injury. 2. Brain atrophy for age. Echocardiogram 10/07/20>> LVEF 35-45%, LV diastolic parameters normal, RV systolic function mildly reduced, no significant valve disease  Resolved Hospital Problem list     Assessment & Plan:  Cardiac arrest: initial rhythm PEA, first rhythm check post CPR aystole  In the setting of hypokalemia & hypoglycemia PMHx: ETOH abuse, T2DM, HLD, HTN  Estimated 13 minutes downtime, CPR initiated immediately (however last seen well 3 hours earlier- found in PEA, received 0 defibrillations, suspected anoxic injury. CBG 21 Patient deemed not a candidate for TTM due to unclear downtime prior to CPR and co-morbidities - Continuous cardiac monitoring and serial EKG's as needed - Vasopressors PRN, to maintain MAP > 65 ~ WEANED OFF - Echocardiogram 10/07/20>> LVEF 62-56%, LV diastolic parameters normal, RV systolic function mildly reduced, no significant valve disease - Trend high-sensitivity troponin (68 ~ 94) -Lactic acidosis now resolved   Acute Hypoxic  Respiratory Failure in the setting of PEA arrest, ? Aspiration Pneumonitis, & Minor Peripheral Bilateral LL Pulmonary Emboli - Ventilator settings: PRVC  8 mL/kg, 28% FiO2, 5 PEEP, continue ventilator support & lung  protective strategies - Wean PEEP & FiO2 as tolerated, maintain SpO2 > 90% - Head of bed elevated 30 degrees, VAP protocol in place - Plateau pressures less than 30 cm H20  - Intermittent chest x-ray & ABG PRN - Daily WUA with SBT as tolerated ~ currently tolerating SBT, will plan for EXTUBATION 4/13 - Ensure adequate pulmonary hygiene  - F/u cultures, trend PCT - Continue Aspiration Pna coverage: unasyn - bronchodilators PRN - PAD protocol in place: continue Fentanyl drip & Propofol drip -Treatment dose Lovenox (once able to tolerate PO can consider DOAC)   Concern for anoxic encephalopathy vs. Metabolic Encephalopathy Acute Ischemic CVA At risk for Alcohol withdrawal PMHx: ETOH abuse (drinks 1/5 of liquor daily) 13 minutes downtime, CPR initiated immediately (however last seen well 3 hours earlier- found in PEA, received 0 defibrillations, Initial head CT negative - PAD protocol in place: Propofol & fentanyl drip - RASS goal: 0, -1 - Daily wake up assessment - MRI Brain 4/11 with numerous small acute/early subacute infarcts within bilateral cerebral hemispheres (embolic vs. Watershed ischemia) - Neurology following, appreciate input - EEG>>"There is evidence of a severe diffuse encephalopathy that is non-specific and can be seen with powerful sedating medications including fentanyl and propofol. There was no evidence to support a diagnosis of seizures on this recording." - CIWA protocol in place - high dose thiamine 500 mg x 5 days followed by 329 mg daily - folic acid & multi-vitamin daily - Daily BMP, replace electrolytes PRN -Obtain Speech evaluation  Hypoglycemia- resolved DKA this admission- resovled PMHx: T2DM - CBG's - SSI - Follow ICU hypo/hyper-glycemia protocol  C-difficile- chronic infection since 11/21 C-diff Ag +, toxin -, PCR + Patient has been treating for recurrent C-diff infection. Has outpatient appt for fecal [name] - continue PO vancomycin -  ID following appreciate input  Pancytopenia PMHx: chronic macrocytic anemia (02/2018) - Monitor for s/s of bleeding - Daily CBC - Transfuse for Hgb <7 - Monitor coag panel - Consider transfusion of platelets if < 10 or if active bleeding  Transaminitis Chronically elevated hepatic levels - trend hepatic function  Best practice (right click and "Reselect all SmartList Selections" daily)  Diet:  NPO, tube feedings Pain/Anxiety/Delirium protocol (if indicated): Yes (RASS goal -1) VAP protocol (if indicated): Yes DVT prophylaxis: Lovenox GI prophylaxis: PPI Glucose control:  SSI Yes Central venous access:  Yes, is still needed Arterial line:  N/A Foley:  Yes, and it is still needed Mobility:  bed rest  PT consulted: N/A Last date of multidisciplinary goals of care discussion: 10/10/20 Code Status:  full code Disposition: ICU  Labs   CBC: Recent Labs  Lab 10/06/20 0650 10/06/20 1915 10/07/20 0405 10/08/20 0450 10/08/20 2000 10/09/20 0435 10/10/20 0326  WBC 3.3*  --  3.0* 5.0  --  8.4 7.6  HGB 7.1*   < > 8.6* 7.0* 8.8* 8.6* 8.2*  HCT 20.7*   < > 26.2* 21.0* 26.5* 25.1* 25.1*  MCV 102.0*  --  106.9* 106.6*  --  102.4* 105.9*  PLT 104*  --  59* 55*  --  53* 67*   < > = values in this interval not displayed.    Basic Metabolic Panel: Recent Labs  Lab 10/06/20 0650 10/06/20 1155 10/07/20 0405 10/07/20 1233 10/08/20 0450  10/08/20 2000 10/09/20 0435 10/10/20 0326  NA 140 140 140  --  141  --  145 144  K 2.6* 2.9* 3.0* 3.8 3.0*  --  3.8 3.0*  CL 102 104 102  --  108  --  111 111  CO2 '31 29 27  ' --  28  --  29 26  GLUCOSE 180* 177* 151*  --  151*  --  114* 189*  BUN 5* 6 6  --  6  --  6 9  CREATININE 0.70 0.72 0.74  --  0.74  --  0.56* 0.68  CALCIUM 7.1* 7.6* 7.6*  --  7.5*  --  7.6* 7.7*  MG  --  2.3 2.1  --  1.8  --  1.9 1.7  PHOS 1.2*  --  4.3  --  1.8* 3.6 3.2  --    GFR: Estimated Creatinine Clearance: 113.3 mL/min (by C-G formula based on SCr of 0.68  mg/dL). Recent Labs  Lab 10/06/20 0216 10/06/20 0551 10/06/20 0650 10/07/20 0405 10/07/20 0409 10/07/20 1241 10/08/20 0450 10/09/20 0435 10/10/20 0326  PROCALCITON  --   --   --   --  0.79  --  1.47 0.52  --   WBC  --   --    < > 3.0*  --   --  5.0 8.4 7.6  LATICACIDVEN 5.2* 1.8  --  4.6*  --  1.3  --   --   --    < > = values in this interval not displayed.    Liver Function Tests: Recent Labs  Lab 10/06/20 0216 10/07/20 0405 10/09/20 0435 10/10/20 0326  AST 50* 1,311* 158* 87*  ALT 29 270* 100* 78*  ALKPHOS 199* 268* 234* 234*  BILITOT 0.6 0.9 1.3* 1.0  PROT 4.9* 5.1* 4.8* 5.0*  ALBUMIN 2.0* 2.2* 1.8* 1.7*   No results for input(s): LIPASE, AMYLASE in the last 168 hours. Recent Labs  Lab 10/07/20 0401  AMMONIA 24    ABG    Component Value Date/Time   PHART 7.42 10/09/2020 1438   PCO2ART 42 10/09/2020 1438   PO2ART 257 (H) 10/09/2020 1438   HCO3 27.2 10/09/2020 1438   TCO2 21 (L) 05/27/2020 2051   ACIDBASEDEF 5.0 (H) 05/27/2020 2051   O2SAT 99.9 10/09/2020 1438     Coagulation Profile: Recent Labs  Lab 09/28/2020 2131 10/07/20 0405 10/07/20 1655 10/09/20 0435 10/10/20 0326  INR 1.2 1.5* 1.5* 1.2 1.1    Cardiac Enzymes: No results for input(s): CKTOTAL, CKMB, CKMBINDEX, TROPONINI in the last 168 hours.  HbA1C: HbA1c, POC (prediabetic range)  Date/Time Value Ref Range Status  02/29/2020 09:19 AM 9.1 (A) 5.7 - 6.4 % Final  03/28/2019 11:36 AM 11.5 (A) 5.7 - 6.4 % Final   HbA1c, POC (controlled diabetic range)  Date/Time Value Ref Range Status  02/29/2020 09:19 AM 9.1 (A) 0.0 - 7.0 % Final  03/28/2019 11:36 AM 11.5 (A) 0.0 - 7.0 % Final   HbA1c POC (<> result, manual entry)  Date/Time Value Ref Range Status  02/29/2020 09:19 AM 9.1 4.0 - 5.6 % Final  03/28/2019 11:36 AM 11.5 4.0 - 5.6 % Final   Hgb A1c MFr Bld  Date/Time Value Ref Range Status  10/06/2020 06:50 AM 11.1 (H) 4.8 - 5.6 % Final    Comment:    (NOTE) Pre diabetes:           5.7%-6.4%  Diabetes:              >  6.4%  Glycemic control for   <7.0% adults with diabetes   10/06/2020 05:51 AM 11.1 (H) 4.8 - 5.6 % Final    Comment:    (NOTE) Pre diabetes:          5.7%-6.4%  Diabetes:              >6.4%  Glycemic control for   <7.0% adults with diabetes     CBG: Recent Labs  Lab 10/09/20 2017 10/10/20 0011 10/10/20 0312 10/10/20 0750 10/10/20 1151  GLUCAP 165* 148* 183* 122* 91    Review of Systems:   UTA- intubated   Past Medical History:  He,  has a past medical history of Alcohol use (02/2020), Clostridioides difficile diarrhea (04/2020), Diabetes mellitus without complication ([place]), Elevated liver enzymes (02/2020), Elevated liver enzymes (02/2020), Hyperlipidemia (02/2020), Hypertension, IV drug user, Vitamin D deficiency (02/2020), and Wound healing, delayed.   Surgical History:   Past Surgical History:  Procedure Laterality Date  . INCISION AND DRAINAGE ABSCESS N/A 03/22/2018   Procedure: INCISION AND DRAINAGE ABSCESS;  Surgeon: Georganna Skeans, MD;  Location: [place];  Service: General;  Laterality: N/A;  . TEE WITHOUT CARDIOVERSION  03/22/2018   Procedure: TRANSESOPHAGEAL ECHOCARDIOGRAM (TEE);  Surgeon: Radiologist, Medication, MD;  Location: [place];  Service: Open Heart Surgery;;     Social History:   reports that he has been smoking cigarettes. He has been smoking about 1.00 pack per day. He has never used smokeless tobacco. He reports current alcohol use. He reports previous drug use. Drug: Cocaine.   Family History:  His family history includes Diabetes in his father and paternal uncle; Esophageal cancer in his maternal grandmother; Heart disease in his father. There is no history of Colon cancer, Pancreatic cancer, Liver disease, or Stomach cancer.   Allergies No Known Allergies   Home Medications  Prior to Admission medications   Medication Sig Start Date End Date Taking? Authorizing Provider  gabapentin (NEURONTIN) 300 MG  capsule Take 2 capsule (600 mg= total) by mouth, 3 times a day. 05/01/20  Yes Azzie Glatter, FNP  glucose blood test strip Use as instructed 03/13/16  Yes Micheline Chapman, NP  insulin glargine (LANTUS) 100 UNIT/ML Solostar Pen Inject 30 Units into the skin daily at 10 pm. 05/30/20  Yes Aline August, MD  insulin lispro (HUMALOG) 100 UNIT/ML injection Inject 0.1 mLs (10 Units total) into the skin 3 (three) times daily with meals. PER SLIDING SCALE 05/30/20  Yes Aline August, MD  Lancets [facility] ULTRASOFT) lancets Use as instructed 03/13/16  Yes Micheline Chapman, NP  vancomycin (VANCOCIN) 125 MG capsule Take one capsule 4 times daily for 2 weeks , Then tale one capsule twice daily for seven days , then take one capsule every other day for 4 weeks. 09/12/20  Yes Zehr, Laban Emperor, PA-C     Critical care time: 76 minutes    Darel Hong, [facility] Pager: [phone]

## 2020-10-10 NOTE — Progress Notes (Signed)
PHARMACY CONSULT NOTE - FOLLOW UP  Pharmacy Consult for Electrolyte Monitoring and Replacement   Recent Labs: Potassium (mmol/L)  Date Value  10/10/2020 3.0 (L)   Magnesium (mg/dL)  Date Value  19/62/2297 1.9   Calcium (mg/dL)  Date Value  98/92/1194 7.7 (L)   Albumin (g/dL)  Date Value  17/40/8144 1.7 (L)  02/29/2020 4.7   Phosphorus (mg/dL)  Date Value  81/85/6314 3.2   Sodium (mmol/L)  Date Value  10/10/2020 144  02/29/2020 145 (H)     Assessment: Caucasian male with medical history significant for C. difficile diarrhea, type II diabetes mellitus, hypertension, hepatitis C, dyslipidemia and alcohol abuse, who presented to the emergency room with acute onset of hyperglycemia with altered mental status with mild confusion. Pharmacy has been consulted for electrolyte monitoring and replacement.   Free water 52mL q4h PROSource BID Vital AF @55mL /hr  Goal of Therapy:  Electrolytes WNL   Plan:   K 3.0 - will give IV KCl x3  Mg 1.7 - will give 2g IV Mg sulfate x1  Monitor electrolytes daily and replace as needed   , PharmD Pharmacy Resident  10/10/2020 7:32 AM

## 2020-10-10 NOTE — Progress Notes (Signed)
Pt. Extubated and placed on 4lnc. No distress  noted at this time.

## 2020-10-10 NOTE — Progress Notes (Signed)
*  PRELIMINARY RESULTS* Echocardiogram 2D Echocardiogram has been performed.  Tony Long 10/10/2020, 1:11 PM

## 2020-10-11 ENCOUNTER — Inpatient Hospital Stay: Payer: Medicaid Other

## 2020-10-11 DIAGNOSIS — B182 Chronic viral hepatitis C: Secondary | ICD-10-CM | POA: Diagnosis not present

## 2020-10-11 DIAGNOSIS — R000 Tachycardia, unspecified: Secondary | ICD-10-CM

## 2020-10-11 DIAGNOSIS — J9601 Acute respiratory failure with hypoxia: Secondary | ICD-10-CM | POA: Diagnosis not present

## 2020-10-11 DIAGNOSIS — I4581 Long QT syndrome: Secondary | ICD-10-CM

## 2020-10-11 DIAGNOSIS — E0810 Diabetes mellitus due to underlying condition with ketoacidosis without coma: Secondary | ICD-10-CM | POA: Diagnosis not present

## 2020-10-11 LAB — CBC
HCT: 21.9 % — ABNORMAL LOW (ref 39.0–52.0)
Hemoglobin: 7.2 g/dL — ABNORMAL LOW (ref 13.0–17.0)
MCH: 35.1 pg — ABNORMAL HIGH (ref 26.0–34.0)
MCHC: 32.9 g/dL (ref 30.0–36.0)
MCV: 106.8 fL — ABNORMAL HIGH (ref 80.0–100.0)
Platelets: 82 10*3/uL — ABNORMAL LOW (ref 150–400)
RBC: 2.05 MIL/uL — ABNORMAL LOW (ref 4.22–5.81)
RDW: 17 % — ABNORMAL HIGH (ref 11.5–15.5)
WBC: 6.3 10*3/uL (ref 4.0–10.5)
nRBC: 0.3 % — ABNORMAL HIGH (ref 0.0–0.2)

## 2020-10-11 LAB — COMPREHENSIVE METABOLIC PANEL
ALT: 57 U/L — ABNORMAL HIGH (ref 0–44)
AST: 67 U/L — ABNORMAL HIGH (ref 15–41)
Albumin: 1.7 g/dL — ABNORMAL LOW (ref 3.5–5.0)
Alkaline Phosphatase: 184 U/L — ABNORMAL HIGH (ref 38–126)
Anion gap: 15 (ref 5–15)
BUN: 11 mg/dL (ref 6–20)
CO2: 21 mmol/L — ABNORMAL LOW (ref 22–32)
Calcium: 7.7 mg/dL — ABNORMAL LOW (ref 8.9–10.3)
Chloride: 113 mmol/L — ABNORMAL HIGH (ref 98–111)
Creatinine, Ser: 0.77 mg/dL (ref 0.61–1.24)
GFR, Estimated: >60 mL/min (ref >60)
Glucose, Bld: 120 mg/dL — ABNORMAL HIGH (ref 70–99)
Potassium: 3 mmol/L — ABNORMAL LOW (ref 3.5–5.1)
Sodium: 149 mmol/L — ABNORMAL HIGH (ref 135–145)
Total Bilirubin: 1.3 mg/dL — ABNORMAL HIGH (ref 0.3–1.2)
Total Protein: 4.9 g/dL — ABNORMAL LOW (ref 6.5–8.1)

## 2020-10-11 LAB — FERRITIN: Ferritin: 406 ng/mL — ABNORMAL HIGH (ref 24–336)

## 2020-10-11 LAB — MAGNESIUM: Magnesium: 1.9 mg/dL (ref 1.7–2.4)

## 2020-10-11 LAB — IRON AND TIBC: Iron: 11 ug/dL — ABNORMAL LOW (ref 45–182)

## 2020-10-11 LAB — GLUCOSE, CAPILLARY
Glucose-Capillary: 118 mg/dL — ABNORMAL HIGH (ref 70–99)
Glucose-Capillary: 121 mg/dL — ABNORMAL HIGH (ref 70–99)
Glucose-Capillary: 67 mg/dL — ABNORMAL LOW (ref 70–99)
Glucose-Capillary: 73 mg/dL (ref 70–99)
Glucose-Capillary: 87 mg/dL (ref 70–99)
Glucose-Capillary: 100 mg/dL — ABNORMAL HIGH (ref 70–99)
Glucose-Capillary: 127 mg/dL — ABNORMAL HIGH (ref 70–99)

## 2020-10-11 LAB — FOLATE: Folate: 16.6 ng/mL (ref >5.9)

## 2020-10-11 LAB — PROTIME-INR
INR: 1.4 — ABNORMAL HIGH (ref 0.8–1.2)
Prothrombin Time: 17.2 seconds — ABNORMAL HIGH (ref 11.4–15.2)

## 2020-10-11 LAB — RETICULOCYTES
Immature Retic Fract: 42.9 % — ABNORMAL HIGH (ref 2.3–15.9)
RBC.: 2.1 MIL/uL — ABNORMAL LOW (ref 4.22–5.81)
Retic Count, Absolute: 44.5 10*3/uL (ref 19.0–186.0)
Retic Ct Pct: 2.1 % (ref 0.4–3.1)

## 2020-10-11 LAB — VITAMIN B12: Vitamin B-12: 1525 pg/mL — ABNORMAL HIGH (ref 180–914)

## 2020-10-11 LAB — PHOSPHORUS: Phosphorus: 4.2 mg/dL (ref 2.5–4.6)

## 2020-10-11 MED ORDER — THIAMINE HCL 100 MG/ML IJ SOLN
100.0000 mg | Freq: Every day | INTRAMUSCULAR | Status: DC
  Administered 2020-10-11 – 2020-10-12 (×2): 100 mg via INTRAVENOUS
  Filled 2020-10-11 (×2): qty 2

## 2020-10-11 MED ORDER — PANTOPRAZOLE SODIUM 40 MG PO PACK: 40.0000 mg | PACK | Freq: Every day | ORAL | Status: DC

## 2020-10-11 MED ORDER — INSULIN ASPART 100 UNIT/ML ~~LOC~~ SOLN
0.0000 [IU] | SUBCUTANEOUS | Status: DC
  Administered 2020-10-12: 2 [IU] via SUBCUTANEOUS
  Administered 2020-10-12 (×2): 1 [IU] via SUBCUTANEOUS
  Administered 2020-10-12: 2 [IU] via SUBCUTANEOUS
  Administered 2020-10-12: 1 [IU] via SUBCUTANEOUS
  Administered 2020-10-13: 4 [IU] via SUBCUTANEOUS
  Administered 2020-10-13 (×2): 5 [IU] via SUBCUTANEOUS
  Administered 2020-10-14: 1 [IU] via SUBCUTANEOUS
  Administered 2020-10-14 (×2): 2 [IU] via SUBCUTANEOUS
  Administered 2020-10-15: 4 [IU] via SUBCUTANEOUS
  Administered 2020-10-15: 3 [IU] via SUBCUTANEOUS
  Administered 2020-10-15 – 2020-10-16 (×2): 2 [IU] via SUBCUTANEOUS
  Administered 2020-10-16 (×3): 1 [IU] via SUBCUTANEOUS
  Filled 2020-10-11 (×18): qty 1

## 2020-10-11 MED ORDER — FOLIC ACID 1 MG PO TABS
1.0000 mg | ORAL_TABLET | Freq: Every day | ORAL | Status: DC
  Administered 2020-10-12 – 2020-10-13 (×2): 1 mg via ORAL
  Filled 2020-10-11 (×2): qty 1

## 2020-10-11 MED ORDER — THIAMINE HCL 100 MG PO TABS: 100.0000 mg | ORAL_TABLET | Freq: Every day | ORAL | Status: DC

## 2020-10-11 MED ORDER — LORAZEPAM 2 MG/ML IJ SOLN
1.0000 mg | INTRAMUSCULAR | Status: DC | PRN
  Administered 2020-10-11 – 2020-10-12 (×4): 2 mg via INTRAVENOUS
  Filled 2020-10-11 (×4): qty 1

## 2020-10-11 MED ORDER — FAMOTIDINE 40 MG/5ML PO SUSR
20.0000 mg | Freq: Two times a day (BID) | ORAL | Status: DC
  Administered 2020-10-11 – 2020-10-17 (×12): 20 mg
  Filled 2020-10-11 (×14): qty 2.5

## 2020-10-11 MED ORDER — GLUCERNA 1.5 CAL PO LIQD
1000.0000 mL | ORAL | Status: DC
  Administered 2020-10-11 – 2020-10-16 (×3): 1000 mL

## 2020-10-11 MED ORDER — LORAZEPAM 1 MG PO TABS: 1.0000 mg | ORAL_TABLET | ORAL | Status: DC | PRN

## 2020-10-11 MED ORDER — FREE WATER
200.0000 mL | Status: DC
  Administered 2020-10-11 – 2020-10-12 (×5): 200 mL

## 2020-10-11 MED ORDER — POTASSIUM CHLORIDE 10 MEQ/50ML IV SOLN
10.0000 meq | INTRAVENOUS | Status: AC
  Administered 2020-10-11 (×6): 10 meq via INTRAVENOUS
  Filled 2020-10-11 (×6): qty 50

## 2020-10-11 MED ORDER — ADULT MULTIVITAMIN W/MINERALS CH
1.0000 | ORAL_TABLET | Freq: Every day | ORAL | Status: DC
  Administered 2020-10-12: 1 via ORAL
  Filled 2020-10-11: qty 1

## 2020-10-11 NOTE — Progress Notes (Addendum)
Nutrition Follow Up Note   DOCUMENTATION CODES:   Non-severe (moderate) malnutrition in context of social or environmental circumstances  INTERVENTION:   Glucerna 1.5'@60ml' /hr- Initiate at 39m/hr and increase by 127mhr q 8 hours until goal rate is reached  Free water flushes 20030m4 hours   Regimen provides 2160kcal/day, 119g/day protein and 2293m31my free water   Continue MVI, folic acid and thiamine daily in setting of substance abuse   NUTRITION DIAGNOSIS:   Moderate Malnutrition related to social / environmental circumstances (subtance abuse) as evidenced by mild fat depletion,moderate fat depletion,mild muscle depletion,moderate muscle depletion.  GOAL:   Patient will meet greater than or equal to 90% of their needs  -previously met with tube feeds   MONITOR:   Diet advancement,Labs,Weight trends,Skin,I & O's,TF tolerance  ASSESSMENT:   44 y65. caucasian male with medical history significant for chronic C. difficile, type II diabetes mellitus, hypertension, hepatitis C, dyslipidemia, substance abuse and alcohol abuse who presented to the emergency room with acute onset of hyperglycemia with altered mental status with mild confusion. Pt found to have DKA s/p PEA arrest 4/10 requiring intubation and ventilation   Pt extubated 4/13. Per SLP evaluation, pt not medically ready for oral diet. Will initiate tube feeds via NGT.   Medications reviewed and include: lovenox, folic acid, insulin, MVI, thiamine, vancomycin, unasyn, pepcid, KCl  Labs reviewed: Na 149(H), K 3.0(L), P 4.2 wnl, Mg 1.9 wnl Hgb 7.2(L), Hct 21.9(L), MCV 106.6(H), MCH 35.1(H) cbgs- 118, 121 x 24 hrs  Diet Order:   Diet Order            Diet NPO time specified  Diet effective now                EDUCATION NEEDS:   No education needs have been identified at this time  Skin:  Skin Assessment: Reviewed RN Assessment (Stage II buttocks)  Last BM:  4/14- type 7  Height:   Ht Readings from Last  1 Encounters:  10/06/20 '5\' 11"'  (1.803 m)    Weight:   Wt Readings from Last 1 Encounters:  10/06/20 68 kg    Ideal Body Weight:  78 kg  BMI:  Body mass index is 20.92 kg/m.  Estimated Nutritional Needs:   Kcal:  2100-2400kcal/day  Protein:  100-120g/day  Fluid:  2.1-2.4L/day  CaseKoleen Distance RD, LDN Please refer to [facility] and/or RD on-call/weekend/after hours pager

## 2020-10-11 NOTE — Progress Notes (Signed)
PHARMACY CONSULT NOTE - FOLLOW UP  Pharmacy Consult for Electrolyte Monitoring and Replacement   Recent Labs: Potassium (mmol/L)  Date Value  10/11/2020 3.0 (L)   Magnesium (mg/dL)  Date Value  75/03/2584 1.9   Calcium (mg/dL)  Date Value  27/78/2423 7.7 (L)   Albumin (g/dL)  Date Value  53/61/4431 1.7 (L)  02/29/2020 4.7   Phosphorus (mg/dL)  Date Value  54/00/8676 4.2   Sodium (mmol/L)  Date Value  10/11/2020 149 (H)  02/29/2020 145 (H)     Assessment: Caucasian male with medical history significant for C. difficile diarrhea, type II diabetes mellitus, hypertension, hepatitis C, dyslipidemia and alcohol abuse, who presented to the emergency room with acute onset of hyperglycemia with altered mental status with mild confusion. Pharmacy has been consulted for electrolyte monitoring and replacement.   Free water 12mL q4h>273mL q4h Glucerna @60mL /hr  Goal of Therapy:  Electrolytes WNL   Plan:   K 3.0 s/p IV 4/13; MD ordered IV KCl 5/13 x6  Na trended up to 149 - RD increased free water to q4h  Monitor electrolytes daily and replace as needed   , PharmD Pharmacy Resident  10/11/2020 6:35 AM

## 2020-10-11 NOTE — Progress Notes (Signed)
eLink Physician-Brief Progress Note Patient Name: Tony Long DOB: 1975/08/08 MRN: [id number]   Date of Service  10/11/2020  HPI/Events of Note  QTc interval = 0.508 seconds.   eICU Interventions  Plan: 1. D/C Precedex IV infusion. 2. Stepdown CIWA protocol.     Intervention Category Major Interventions: Other:  Lenell Antu 10/11/2020, 6:21 AM

## 2020-10-11 NOTE — Progress Notes (Addendum)
PROGRESS NOTE    Tony Long  CXK:[id number] DOB: 12-05-1975 DOA: 10/21/2020 PCP: Azzie Glatter, FNP  Brief Narrative: 45 year old male chronically ill secondary to uncontrolled diabetes mellitus and ongoing alcohol abuse, was admitted to [facility] on 4/9 with DKA and hypokalemia, suffered a PEA arrest on early 4/10 secondary to severe hypokalemia (cbg 21) and severe hypoglycemia achieved ROSC in 13 minutes, subsequently intubated and admitted to the ICU, post arrest had acute hypoxic respiratory failure with concern for aspiration pneumonitis, also has anoxic encephalopathy as well as multiple ischemic infarcts on MRI brain.  In the ICU also had CTA chest which showed mild very minor distal bilateral PE. -All this in the background of chronic hep C, alcohol and opiate abuse and uncontrolled diabetes mellitus -Extubated yesterday 4/13 and transferred to [facility] service today 4/40   Assessment & Plan:   S/p PEA arrest -Suspected to be secondary to severe hypoglycemia and hyperkalemia -Estimated 13 minutes downtime, CPR initiated immediately (however last seen well 3 hours earlier- found in PEA, received 0 defibrillations, suspected anoxic injury. CBG 21, hypothermia protocol was not initiated due to unclear downtime Patient deemed not a candidate for TTM due to unclear downtime  -Was briefly on pressors, then weaned off - Echocardiogram 10/07/20>> LVEF 97-02%, LV diastolic parameters normal, RV systolic function mildly reduced, no significant valve disease  Acute Hypoxic Respiratory Failure in the setting of PEA arrest,  ? Aspiration Pneumonitis, & Minor Peripheral Bilateral LL Pulmonary Emboli -Intubated on 4/10 and extubated 4/13 -Imaging also concerning for suspected aspiration pneumonitis, currently on IV Unasyn day 4/7 -Bronchodilators as needed -On full dose Lovenox, continue this for now  Dysphagia -Secondary to anoxic encephalopathy, CVA etc. -SLP following, failed swallow eval  today, start tube feeds via NG tube  Anoxic encephalopathy Acute Ischemic CVA Alcohol abuse, risk of withdrawal, drinks 1/5 of liquor daily -Off Precedex drip last night -Currently on Ativan per CIWA protocol, minimize sedation as tolerated -Continue high-dose thiamine -SLP, PT OT eval  Acute/subacute infarcts - MRI Brain 4/11 with numerous small acute/early subacute infarcts within bilateral cerebral hemispheres (embolic vs. Watershed ischemia) -Likely following PEA arrest -Neurology following, appreciate input -Extubated now, on as needed Ativan -Continue full dose Lovenox  DKA  -On admission, resolved  Severe hypoglycemia Type 2 diabetes mellitus -Continue sliding scale insulin -Check HbA1c  Recurrent C Diff  -C Diff Ag positive, toxin negative, PCR positive  -Followed by ID, Dr. Baxter Flattery. He was initially diagnosed in November, has completed vanco x 2, dificid, then most recently vanco taper (started 3/16, should be on 1 capsule every other day for 4 weeks now, last day treatment 5/3). Referred to [facility] for fecal transplant (appt at [facility] 5/23), considered for zinplava infusion. Has appointment with Dr. Baxter Flattery 4/13  -Continue vanco taper   Pancytopenia Chronic macrocytic anemia -Likely secondary to alcoholism, check anemia panel  Transaminitis Alcoholic liver disease and hep C -Continue to trend   DVT prophylaxis: Full dose Lovenox Code Status: Full code Family Communication: No family at bedside, will update daughter Disposition Plan:  Status is: Inpatient  Remains inpatient appropriate because:Inpatient level of care appropriate due to severity of illness   Dispo: The patient is from: Home              Anticipated d/c is to: SNF              Patient currently is not medically stable to d/c.   Difficult to place patient No  Consultants:   PCCM Tx, NEuro   Procedures:   Antimicrobials:    Subjective: -Remains somewhat confused,  intermittent agitation  Objective: Vitals:   10/11/20 0300 10/11/20 0400 10/11/20 0500 10/11/20 0600  BP: 105/69 101/81 109/76 108/71  Pulse: 100 95 87 77  Resp: 18 (!) '21 14 14  ' Temp:  (!) 97.4 F (36.3 C)    TempSrc:  Axillary    SpO2: 99% 93% 100% 100%  Weight:      Height:        Intake/Output Summary (Last 24 hours) at 10/11/2020 1215 Last data filed at 10/11/2020 1000 Gross per 24 hour  Intake 283.92 ml  Output 2720 ml  Net -2436.08 ml   Filed Weights   10/24/2020 1941 10/06/20 0604 10/06/20 1445  Weight: 68 kg 63.5 kg 68 kg    Examination:  General exam: Chronically ill male laying in bed, awake but confused, mumbles a few words, disoriented HEENT: central line  CVS: S1-S2, regular rate rhythm Lungs: Decreased breath sounds to bases, otherwise clear Abdomen: Soft, nontender, bowel sounds present Extremities: No edema  Psych: Unable to assess     Data Reviewed:   CBC: Recent Labs  Lab 10/07/20 0405 10/08/20 0450 10/08/20 2000 10/09/20 0435 10/10/20 0326 10/11/20 0517  WBC 3.0* 5.0  --  8.4 7.6 6.3  HGB 8.6* 7.0* 8.8* 8.6* 8.2* 7.2*  HCT 26.2* 21.0* 26.5* 25.1* 25.1* 21.9*  MCV 106.9* 106.6*  --  102.4* 105.9* 106.8*  PLT 59* 55*  --  53* 67* 82*   Basic Metabolic Panel: Recent Labs  Lab 10/07/20 0405 10/07/20 1233 10/08/20 0450 10/08/20 2000 10/09/20 0435 10/10/20 0326 10/11/20 0517  NA 140  --  141  --  145 144 149*  K 3.0* 3.8 3.0*  --  3.8 3.0* 3.0*  CL 102  --  108  --  111 111 113*  CO2 27  --  28  --  29 26 21*  GLUCOSE 151*  --  151*  --  114* 189* 120*  BUN 6  --  6  --  '6 9 11  ' CREATININE 0.74  --  0.74  --  0.56* 0.68 0.77  CALCIUM 7.6*  --  7.5*  --  7.6* 7.7* 7.7*  MG 2.1  --  1.8  --  1.9 1.7 1.9  PHOS 4.3  --  1.8* 3.6 3.2  --  4.2   GFR: Estimated Creatinine Clearance: 113.3 mL/min (by C-G formula based on SCr of 0.77 mg/dL). Liver Function Tests: Recent Labs  Lab 10/06/20 0216 10/07/20 0405 10/09/20 0435  10/10/20 0326 10/11/20 0517  AST 50* 1,311* 158* 87* 67*  ALT 29 270* 100* 78* 57*  ALKPHOS 199* 268* 234* 234* 184*  BILITOT 0.6 0.9 1.3* 1.0 1.3*  PROT 4.9* 5.1* 4.8* 5.0* 4.9*  ALBUMIN 2.0* 2.2* 1.8* 1.7* 1.7*   No results for input(s): LIPASE, AMYLASE in the last 168 hours. Recent Labs  Lab 10/07/20 0401  AMMONIA 24   Coagulation Profile: Recent Labs  Lab 10/07/20 0405 10/07/20 1655 10/09/20 0435 10/10/20 0326 10/11/20 0517  INR 1.5* 1.5* 1.2 1.1 1.4*   Cardiac Enzymes: No results for input(s): CKTOTAL, CKMB, CKMBINDEX, TROPONINI in the last 168 hours. BNP (last 3 results) No results for input(s): PROBNP in the last 8760 hours. HbA1C: No results for input(s): HGBA1C in the last 72 hours. CBG: Recent Labs  Lab 10/10/20 2323 10/11/20 0352 10/11/20 0724 10/11/20 1129 10/11/20 1150  GLUCAP 107* 118*  121* 67* 73   Lipid Profile: No results for input(s): CHOL, HDL, LDLCALC, TRIG, CHOLHDL, LDLDIRECT in the last 72 hours. Thyroid Function Tests: No results for input(s): TSH, T4TOTAL, FREET4, T3FREE, THYROIDAB in the last 72 hours. Anemia Panel: No results for input(s): VITAMINB12, FOLATE, FERRITIN, TIBC, IRON, RETICCTPCT in the last 72 hours. Urine analysis:    Component Value Date/Time   COLORURINE YELLOW (A) 10/03/2020 2253   APPEARANCEUR CLEAR (A) 10/11/2020 2253   LABSPEC 1.017 10/27/2020 2253   PHURINE 6.0 10/23/2020 2253   GLUCOSEU >=500 (A) 10/11/2020 2253   HGBUR NEGATIVE 10/14/2020 2253   BILIRUBINUR NEGATIVE 10/16/2020 2253   BILIRUBINUR negative 06/12/2020 1541   BILIRUBINUR neg 02/29/2020 0918   KETONESUR 20 (A) 10/13/2020 2253   PROTEINUR NEGATIVE 10/04/2020 2253   UROBILINOGEN 0.2 06/12/2020 1541   UROBILINOGEN 1.0 12/11/2016 1020   NITRITE NEGATIVE 10/09/2020 2253   LEUKOCYTESUR NEGATIVE 10/23/2020 2253   Sepsis Labs: '@LABRCNTIP' (procalcitonin:4,lacticidven:4)  ) Recent Results (from the past 240 hour(s))  Resp Panel by RT-PCR (Flu  A&B, Covid) Nasopharyngeal Swab     Status: None   Collection Time: 10/04/2020  9:13 PM   Specimen: Nasopharyngeal Swab; Nasopharyngeal(NP) swabs in vial transport medium  Result Value Ref Range Status   SARS Coronavirus 2 by RT PCR NEGATIVE NEGATIVE Final    Comment: (NOTE) SARS-CoV-2 target nucleic acids are NOT DETECTED.  The SARS-CoV-2 RNA is generally detectable in upper respiratory specimens during the acute phase of infection. The lowest concentration of SARS-CoV-2 viral copies this assay can detect is 138 copies/mL. A negative result does not preclude SARS-Cov-2 infection and should not be used as the sole basis for treatment or other patient management decisions. A negative result may occur with  improper specimen collection/handling, submission of specimen other than nasopharyngeal swab, presence of viral mutation(s) within the areas targeted by this assay, and inadequate number of viral copies(<138 copies/mL). A negative result must be combined with clinical observations, patient history, and epidemiological information. The expected result is Negative.  Fact Sheet for Patients:  EntrepreneurPulse.com.au  Fact Sheet for Healthcare Providers:  IncredibleEmployment.be  This test is no t yet approved or cleared by the [place] FDA and  has been authorized for detection and/or diagnosis of SARS-CoV-2 by FDA under an Emergency Use Authorization (EUA). This EUA will remain  in effect (meaning this test can be used) for the duration of the COVID-19 declaration under Section 564(b)(1) of the Act, 21 U.S.C.section 360bbb-3(b)(1), unless the authorization is terminated  or revoked sooner.       Influenza A by PCR NEGATIVE NEGATIVE Final   Influenza B by PCR NEGATIVE NEGATIVE Final    Comment: (NOTE) The Xpert Xpress SARS-CoV-2/FLU/RSV plus assay is intended as an aid in the diagnosis of influenza from Nasopharyngeal swab specimens  and should not be used as a sole basis for treatment. Nasal washings and aspirates are unacceptable for Xpert Xpress SARS-CoV-2/FLU/RSV testing.  Fact Sheet for Patients: EntrepreneurPulse.com.au  Fact Sheet for Healthcare Providers: IncredibleEmployment.be  This test is not yet approved or cleared by the [place] FDA and has been authorized for detection and/or diagnosis of SARS-CoV-2 by FDA under an Emergency Use Authorization (EUA). This EUA will remain in effect (meaning this test can be used) for the duration of the COVID-19 declaration under Section 564(b)(1) of the Act, 21 U.S.C. section 360bbb-3(b)(1), unless the authorization is terminated or revoked.  Performed at [facility], [address], [place], [place] [address]   Blood  Culture (routine x 2)     Status: None   Collection Time: 10/07/2020  9:57 PM   Specimen: BLOOD  Result Value Ref Range Status   Specimen Description BLOOD  LEFT HAND  Final   Special Requests   Final    BOTTLES DRAWN AEROBIC AND ANAEROBIC Blood Culture adequate volume   Culture   Final    NO GROWTH 5 DAYS Performed at [facility], [address] [place], [place] [address]    Report Status 10/10/2020 FINAL  Final  Blood Culture (routine x 2)     Status: None   Collection Time: 10/19/2020  9:57 PM   Specimen: BLOOD  Result Value Ref Range Status   Specimen Description BLOOD  LEFT HAND  Final   Special Requests   Final    BOTTLES DRAWN AEROBIC AND ANAEROBIC Blood Culture adequate volume   Culture   Final    NO GROWTH 5 DAYS Performed at [facility], [address], [place], [place] [address]    Report Status 10/10/2020 FINAL  Final  Urine culture     Status: Abnormal   Collection Time: 10/13/2020 10:53 PM   Specimen: In/Out Cath Urine  Result Value Ref Range Status   Specimen Description   Final    IN/OUT CATH URINE Performed at [facility], [address] [place], [place] [address]    Special Requests   Final    NONE Performed at [facility], [address] [place], [place] [address]    Culture (A)  Final    4,000 COLONIES/mL STAPHYLOCOCCUS EPIDERMIDIS 100 COLONIES/mL ESCHERICHIA COLI    Report Status 10/08/2020 FINAL  Final   Organism ID, Bacteria STAPHYLOCOCCUS EPIDERMIDIS (A)  Final   Organism ID, Bacteria ESCHERICHIA COLI (A)  Final      Susceptibility   Escherichia coli - MIC*    AMPICILLIN <=2 SENSITIVE Sensitive     CEFAZOLIN <=4 SENSITIVE Sensitive     CEFEPIME <=0.12 SENSITIVE Sensitive     CEFTRIAXONE <=0.25 SENSITIVE Sensitive     CIPROFLOXACIN <=0.25 SENSITIVE Sensitive     GENTAMICIN <=1 SENSITIVE Sensitive     IMIPENEM <=0.25 SENSITIVE Sensitive     NITROFURANTOIN <=16 SENSITIVE Sensitive     TRIMETH/SULFA <=20 SENSITIVE Sensitive     AMPICILLIN/SULBACTAM <=2 SENSITIVE Sensitive     PIP/TAZO <=4 SENSITIVE Sensitive     * 100 COLONIES/mL ESCHERICHIA COLI   Staphylococcus epidermidis - MIC*    CIPROFLOXACIN <=0.5 SENSITIVE Sensitive     GENTAMICIN <=0.5 SENSITIVE Sensitive     NITROFURANTOIN <=16 SENSITIVE Sensitive     OXACILLIN >=4 RESISTANT Resistant     TETRACYCLINE <=1 SENSITIVE Sensitive     VANCOMYCIN 1 SENSITIVE Sensitive     TRIMETH/SULFA <=10 SENSITIVE Sensitive     CLINDAMYCIN 2 INTERMEDIATE Intermediate     RIFAMPIN <=0.5 SENSITIVE Sensitive     Inducible Clindamycin NEGATIVE Sensitive     * 4,000 COLONIES/mL STAPHYLOCOCCUS EPIDERMIDIS  MRSA PCR Screening     Status: Abnormal   Collection Time: 10/06/20  1:10 AM   Specimen: Nasal Mucosa; Nasopharyngeal  Result Value Ref Range Status   MRSA by PCR POSITIVE (A) NEGATIVE Final    Comment:        The GeneXpert MRSA Assay (FDA approved for NASAL specimens only), is one component of a comprehensive MRSA colonization surveillance program. It is not intended to diagnose MRSA infection nor to guide or monitor treatment for MRSA  infections. NOTIFIED  Ricke Hey RN 5809 10/06/2020 JG Performed at [facility], [place], [place] [address]   C Difficile Quick Screen w PCR reflex     Status: Abnormal   Collection Time: 10/06/20  1:30 AM   Specimen: STOOL  Result Value Ref Range Status   C Diff antigen POSITIVE (A) NEGATIVE Final   C Diff toxin NEGATIVE NEGATIVE Final   C Diff interpretation Results are indeterminate. See PCR results.  Final    Comment: Performed at [name], [address] [place], [place] [address]  Gastrointestinal Panel by PCR , Stool     Status: None   Collection Time: 10/06/20  1:30 AM   Specimen: Nasal Mucosa; Stool  Result Value Ref Range Status   Campylobacter species NOT DETECTED NOT DETECTED Final   Plesimonas shigelloides NOT DETECTED NOT DETECTED Final   Salmonella species NOT DETECTED NOT DETECTED Final   Yersinia enterocolitica NOT DETECTED NOT DETECTED Final   Vibrio species NOT DETECTED NOT DETECTED Final   Vibrio cholerae NOT DETECTED NOT DETECTED Final   Enteroaggregative E coli (EAEC) NOT DETECTED NOT DETECTED Final   Enteropathogenic E coli (EPEC) NOT DETECTED NOT DETECTED Final   Enterotoxigenic E coli (ETEC) NOT DETECTED NOT DETECTED Final   Shiga like toxin producing E coli (STEC) NOT DETECTED NOT DETECTED Final   Shigella/Enteroinvasive E coli (EIEC) NOT DETECTED NOT DETECTED Final   Cryptosporidium NOT DETECTED NOT DETECTED Final   Cyclospora cayetanensis NOT DETECTED NOT DETECTED Final   Entamoeba histolytica NOT DETECTED NOT DETECTED Final   Giardia lamblia NOT DETECTED NOT DETECTED Final   Adenovirus F40/41 NOT DETECTED NOT DETECTED Final   Astrovirus NOT DETECTED NOT DETECTED Final   Norovirus GI/GII NOT DETECTED NOT DETECTED Final   Rotavirus A NOT DETECTED NOT DETECTED Final   Sapovirus (I, II, IV, and V) NOT DETECTED NOT DETECTED Final    Comment: Performed at [facility], [address] [place],  [place] [address]  C. Diff by PCR, Reflexed     Status: Abnormal   Collection Time: 10/06/20  1:30 AM  Result Value Ref Range Status   Toxigenic C. Difficile by PCR POSITIVE (A) NEGATIVE Final    Comment: Positive for toxigenic C. difficile with little to no toxin production. Only treat if clinical presentation suggests symptomatic illness. Performed at [name], [address] [place], [place] [address]   Culture, Respiratory w Gram Stain     Status: None   Collection Time: 10/07/20  5:00 AM   Specimen: Tracheal Aspirate; Respiratory  Result Value Ref Range Status   Specimen Description   Final    TRACHEAL ASPIRATE Performed at [facility], [address] [place], [place] [address]    Special Requests   Final    NONE Performed at [facility], [address] [place], [place] [address]    Gram Stain   Final    FEW WBC PRESENT,BOTH PMN AND MONONUCLEAR RARE SQUAMOUS EPITHELIAL CELLS PRESENT NO ORGANISMS SEEN    Culture   Final    FEW Consistent with normal respiratory flora. No Pseudomonas species isolated Performed at [facility] [address], [place], [place] [address]    Report Status 10/09/2020 FINAL  Final         Radiology Studies: CT ANGIO CHEST PE W OR WO CONTRAST  Result Date: 10/09/2020 CLINICAL DATA:  Alcohol use, diabetes, IV drug use, intubated, DKA, recent PEA arrest EXAM: CT  ANGIOGRAPHY CHEST WITH CONTRAST TECHNIQUE: Multidetector CT imaging of the chest was performed using the standard protocol during bolus administration of intravenous contrast. Multiplanar CT image reconstructions and MIPs were obtained to evaluate the vascular anatomy. CONTRAST:  49m OMNIPAQUE IOHEXOL 350 MG/ML SOLN, <See Chart> OMNIPAQUE IOHEXOL 350 MG/ML SOLN COMPARISON:  10/09/2020 chest x-ray, 03/30/2018 CTA FINDINGS: Cardiovascular: Pulmonary arteries are well visualized and patent centrally. Central pulmonary arteries are normal in caliber. Within  a few lower lobe peripheral segmental branches, small bilateral lower lobe emboli are present. Very low/minimal thrombus burden. No significant central or proximal hilar emboli. No CTA evidence of right heart strain. Intact thoracic aorta with 3 vessel arch anatomy. No aneurysm or dissection. No mediastinal hemorrhage or hematoma. Normal heart size. No pericardial effusion. Right IJ central line tip lower SVC level. No central venous occlusion. Mediastinum/Nodes: Endotracheal tube in the mid trachea well above the carina. NG tube within a nondilated esophagus. No hiatal hernia. No adenopathy. Lungs/Pleura: Clear upper lobes. Right lower lobe central bronchial mucous plugging or retained secretions with associated right lower lobe partial collapse/consolidation. These findings may represent component of pneumonia and lung collapse related to aspiration. Minor left base dependent atelectasis. Trace pleural effusions. No pneumothorax. Upper Abdomen: Marked hepatic steatosis of the liver. NG tube within the stomach, tip not imaged on the study. No other acute upper abdominal finding. Musculoskeletal: No chest wall abnormality. No acute or significant osseous findings. Review of the MIP images confirms the above findings. IMPRESSION: Very minor peripheral bilateral lower lobe pulmonary emboli. Very low thrombus burden. No signs of right heart strain. Proximal right lower lobe bronchi retained secretions versus mucous plugging with associated right lower consolidative airspace process/atelectasis. Pneumonia and or aspiration could have this appearance. Minor left base atelectasis Trace pleural effusions Hepatic steatosis Electronically Signed   By: MJerilynn Mages  Shick M.D.   On: 10/09/2020 13:39   UKoreaVenous Img Lower Bilateral (DVT)  Result Date: 10/09/2020 CLINICAL DATA:  Lower extremity edema. EXAM: BILATERAL LOWER EXTREMITY VENOUS DOPPLER ULTRASOUND TECHNIQUE: Gray-scale sonography with graded compression, as well as color  Doppler and duplex ultrasound were performed to evaluate the lower extremity deep venous systems from the level of the common femoral vein and including the common femoral, femoral, profunda femoral, popliteal and calf veins including the posterior tibial, peroneal and gastrocnemius veins when visible. The superficial great saphenous vein was also interrogated. Spectral Doppler was utilized to evaluate flow at rest and with distal augmentation maneuvers in the common femoral, femoral and popliteal veins. COMPARISON:  None. FINDINGS: RIGHT LOWER EXTREMITY Common Femoral Vein: No evidence of thrombus. Normal compressibility, respiratory phasicity and response to augmentation. Saphenofemoral Junction: No evidence of thrombus. Normal compressibility and flow on color Doppler imaging. Profunda Femoral Vein: No evidence of thrombus. Normal compressibility and flow on color Doppler imaging. Femoral Vein: No evidence of thrombus. Normal compressibility, respiratory phasicity and response to augmentation. Popliteal Vein: No evidence of thrombus. Normal compressibility, respiratory phasicity and response to augmentation. Calf Veins: No evidence of thrombus. Normal compressibility and flow on color Doppler imaging. Superficial Great Saphenous Vein: No evidence of thrombus. Normal compressibility. Venous Reflux:  None. Other Findings: No evidence of superficial thrombophlebitis or abnormal fluid collection. LEFT LOWER EXTREMITY Common Femoral Vein: No evidence of thrombus. Normal compressibility, respiratory phasicity and response to augmentation. Saphenofemoral Junction: No evidence of thrombus. Normal compressibility and flow on color Doppler imaging. Profunda Femoral Vein: No evidence of thrombus. Normal compressibility and flow on color Doppler imaging. Femoral Vein: No  evidence of thrombus. Normal compressibility, respiratory phasicity and response to augmentation. Popliteal Vein: No evidence of thrombus. Normal  compressibility, respiratory phasicity and response to augmentation. Calf Veins: No evidence of thrombus. Normal compressibility and flow on color Doppler imaging. Superficial Great Saphenous Vein: No evidence of thrombus. Normal compressibility. Venous Reflux:  None. Other Findings: No evidence of superficial thrombophlebitis or abnormal fluid collection. IMPRESSION: No evidence of deep venous thrombosis in either lower extremity. Electronically Signed   By: Aletta Edouard M.D.   On: 10/09/2020 16:22   ECHOCARDIOGRAM LIMITED BUBBLE STUDY  Result Date: 10/10/2020    ECHOCARDIOGRAM LIMITED REPORT   Patient Name:   TRAVEN DAVIDS Date of Exam: 10/10/2020 Medical Rec #:  [id number]       Height:       71.0 in Accession #:    [id number]      Weight:       150.0 lb Date of Birth:  [birth year]       BSA:          1.866 m Patient Age:    53 years        BP:           98/65 mmHg Patient Gender: M               HR:           62 bpm. Exam Location:  [facility] Procedure: 2D Echo, Limited Color Doppler, Cardiac Doppler and Saline Contrast            Bubble Study Indications:     Q21.1 Patent foramen ovale  History:         Patient has prior history of Echocardiogram examinations, most                  recent 10/07/2020. Risk Factors:Hypertension, Diabetes and                  Dyslipidemia. IV drug and alcohol abuse.  Sonographer:     Charmayne Sheer RDCS (AE) Referring Phys:  [id number] Candee Furbish Diagnosing Phys: Kate Sable MD IMPRESSIONS  1. Left ventricular ejection fraction, by estimation, is 55 to 60%. The left ventricle has normal function. The left ventricle has no regional wall motion abnormalities.  2. The mitral valve is normal in structure. No evidence of mitral valve regurgitation.  3. Agitated saline contrast bubble study was negative, with no evidence of any interatrial shunt. FINDINGS  Left Ventricle: Left ventricular ejection fraction, by estimation, is 55 to 60%. The left ventricle has normal function. The left  ventricle has no regional wall motion abnormalities. The left ventricular internal cavity size was normal in size. Left Atrium: Left atrial size was normal in size. Right Atrium: Right atrial size was normal in size. Mitral Valve: The mitral valve is normal in structure. Tricuspid Valve: The tricuspid valve is normal in structure. Tricuspid valve regurgitation is trivial. IAS/Shunts: No atrial level shunt detected by color flow Doppler. Agitated saline contrast was given intravenously to evaluate for intracardiac shunting. Agitated saline contrast bubble study was negative, with no evidence of any interatrial shunt. LEFT VENTRICLE PLAX 2D LVIDd:         4.10 cm LVIDs:         2.80 cm LV PW:         1.10 cm LV IVS:        1.20 cm  LEFT ATRIUM         Index LA diam:  2.70 cm 1.45 cm/m Kate Sable MD Electronically signed by Kate Sable MD Signature Date/Time: 10/10/2020/2:46:36 PM    Final    Scheduled Meds: . chlorhexidine gluconate (MEDLINE KIT)  15 mL Mouth Rinse BID  . Chlorhexidine Gluconate Cloth  6 each Topical Daily  . enoxaparin (LOVENOX) injection  1 mg/kg Subcutaneous Q12H  . folic acid  1 mg Oral Daily  . gabapentin  600 mg Per Tube TID  . insulin aspart  0-15 Units Subcutaneous Q4H  . multivitamin with minerals  1 tablet Oral Daily  . sodium chloride flush  10-40 mL Intracatheter Q12H  . thiamine  100 mg Oral Daily   Or  . thiamine  100 mg Intravenous Daily  . vancomycin  125 mg Per Tube QODAY   Continuous Infusions: . sodium chloride Stopped (10/08/20 1436)  . sodium chloride    . ampicillin-sulbactam (UNASYN) IV 3 g (10/11/20 0853)  . famotidine (PEPCID) IV 20 mg (10/10/20 2248)  . potassium chloride 10 mEq (10/11/20 1144)     LOS: 6 days    Time spent: 62mn  PDomenic Polite MD [facility]  10/11/2020, 12:15 PM

## 2020-10-11 NOTE — Progress Notes (Signed)
eLink Physician-Brief Progress Note Patient Name: Tony Long DOB: 19-Jul-1975 MRN: [id number]   Date of Service  10/11/2020  HPI/Events of Note  Hypokalemia - K+ = 3.0 and Creatinine = 0.77.  eICU Interventions  Will replace K+.     Intervention Category Major Interventions: Electrolyte abnormality - evaluation and management  Lenell Antu 10/11/2020, 6:31 AM

## 2020-10-11 NOTE — Progress Notes (Addendum)
SLP Cancellation Note  Patient Details Name: Tony Long MRN: [id number] DOB: 03-Jul-1975   Cancelled treatment:       Reason Eval/Treat Not Completed: Patient not medically ready;Patient's level of consciousness (chart reviewed; met w/ NSG in pt's room). Per chart notes, 45 y.o. male with medical history significant for Substance and ETOH abuse/use, chronic C. difficile, type II diabetes mellitus, hypertension, hepatitis C, dyslipidemia who presented to the emergency room with acute onset of hyperglycemia w/ AMS. Pt found to have DKA s/p PEA arrest 4/10 requiring intubation and ventilation. Extubated on 10/10/2020 post ~4 days of intubation. MRI revealed numerous small bilateral cerebral hemisphere infarcts. CT Angio of Chest revealed "proximal right lower lobe bronchi retained secretions versus mucous plugging with associated right lower consolidative airspace process/atelectasis"; minor peripheral bilateral lower lobe PE's (no right heart strain). Pt has an NG present; O2 support of 2L [place].  Upon meeting pt at bedside, pt presents w/ Significant decreased alertness and overall awareness of self and immediate surroundings. He only gave few moaning phonations as responses. Eyes remained closed. Noted increased involuntary motor movements in bed w/ arms raised to the ceiling at times. Pt exhibited Mod+ congested coughing x2 w/ NSG offering the [place] in attempts to suction secretions though pt did not participate to aid oral clearing. Pt did not follow any commands. Mitts bilateral+. NSG reported having to give Ativan this AM.  Pt is not appropriate for po trials or assessment at this time d/t Significant Risk for Aspiration secondary to his presentation but especially d/t his Declined Cognitive Awareness -- this could certainly impact safety w/ any oral intake. Recommend frequent oral care for hygiene and stimulation of swallowing. ST services will continue to monitor pt's status for improvement of  Cognitive status for ability to safely participate in BSE. NSG agreed. MD updated also.       Orinda Kenner, MS, CCC-SLP Speech Language Pathologist Rehab Services [phone] [facility] 10/11/2020, 11:51 AM

## 2020-10-12 ENCOUNTER — Inpatient Hospital Stay: Payer: Medicaid Other

## 2020-10-12 DIAGNOSIS — I6381 Other cerebral infarction due to occlusion or stenosis of small artery: Secondary | ICD-10-CM

## 2020-10-12 DIAGNOSIS — J9601 Acute respiratory failure with hypoxia: Secondary | ICD-10-CM | POA: Diagnosis not present

## 2020-10-12 DIAGNOSIS — E0810 Diabetes mellitus due to underlying condition with ketoacidosis without coma: Secondary | ICD-10-CM | POA: Diagnosis not present

## 2020-10-12 DIAGNOSIS — B182 Chronic viral hepatitis C: Secondary | ICD-10-CM | POA: Diagnosis not present

## 2020-10-12 LAB — COMPREHENSIVE METABOLIC PANEL
ALT: 56 U/L — ABNORMAL HIGH (ref 0–44)
AST: 60 U/L — ABNORMAL HIGH (ref 15–41)
Albumin: 1.6 g/dL — ABNORMAL LOW (ref 3.5–5.0)
Alkaline Phosphatase: 201 U/L — ABNORMAL HIGH (ref 38–126)
Anion gap: 18 — ABNORMAL HIGH (ref 5–15)
BUN: 16 mg/dL (ref 6–20)
CO2: 15 mmol/L — ABNORMAL LOW (ref 22–32)
Calcium: 8 mg/dL — ABNORMAL LOW (ref 8.9–10.3)
Chloride: 115 mmol/L — ABNORMAL HIGH (ref 98–111)
Creatinine, Ser: 1.12 mg/dL (ref 0.61–1.24)
GFR, Estimated: >60 mL/min (ref >60)
Glucose, Bld: 160 mg/dL — ABNORMAL HIGH (ref 70–99)
Potassium: 3.1 mmol/L — ABNORMAL LOW (ref 3.5–5.1)
Sodium: 148 mmol/L — ABNORMAL HIGH (ref 135–145)
Total Bilirubin: 2.9 mg/dL — ABNORMAL HIGH (ref 0.3–1.2)
Total Protein: 4.9 g/dL — ABNORMAL LOW (ref 6.5–8.1)

## 2020-10-12 LAB — PROTIME-INR
INR: 1.2 (ref 0.8–1.2)
Prothrombin Time: 15.6 seconds — ABNORMAL HIGH (ref 11.4–15.2)

## 2020-10-12 LAB — CBC
HCT: 22.9 % — ABNORMAL LOW (ref 39.0–52.0)
Hemoglobin: 7.3 g/dL — ABNORMAL LOW (ref 13.0–17.0)
MCH: 34.4 pg — ABNORMAL HIGH (ref 26.0–34.0)
MCHC: 31.9 g/dL (ref 30.0–36.0)
MCV: 108 fL — ABNORMAL HIGH (ref 80.0–100.0)
Platelets: 122 10*3/uL — ABNORMAL LOW (ref 150–400)
RBC: 2.12 MIL/uL — ABNORMAL LOW (ref 4.22–5.81)
RDW: 17.8 % — ABNORMAL HIGH (ref 11.5–15.5)
WBC: 7.6 10*3/uL (ref 4.0–10.5)
nRBC: 0.3 % — ABNORMAL HIGH (ref 0.0–0.2)

## 2020-10-12 LAB — MAGNESIUM: Magnesium: 1.8 mg/dL (ref 1.7–2.4)

## 2020-10-12 LAB — GLUCOSE, CAPILLARY
Glucose-Capillary: 191 mg/dL — ABNORMAL HIGH (ref 70–99)
Glucose-Capillary: 205 mg/dL — ABNORMAL HIGH (ref 70–99)
Glucose-Capillary: 192 mg/dL — ABNORMAL HIGH (ref 70–99)
Glucose-Capillary: 212 mg/dL — ABNORMAL HIGH (ref 70–99)
Glucose-Capillary: 131 mg/dL — ABNORMAL HIGH (ref 70–99)
Glucose-Capillary: 102 mg/dL — ABNORMAL HIGH (ref 70–99)

## 2020-10-12 LAB — PHOSPHORUS: Phosphorus: 4.6 mg/dL (ref 2.5–4.6)

## 2020-10-12 MED ORDER — ADULT MULTIVITAMIN W/MINERALS CH
1.0000 | ORAL_TABLET | Freq: Every day | ORAL | Status: DC
  Administered 2020-10-13 – 2020-10-17 (×5): 1
  Filled 2020-10-12 (×5): qty 1

## 2020-10-12 MED ORDER — INSULIN ASPART 100 UNIT/ML ~~LOC~~ SOLN
2.0000 [IU] | SUBCUTANEOUS | Status: DC
  Administered 2020-10-12 – 2020-10-14 (×8): 2 [IU] via SUBCUTANEOUS
  Filled 2020-10-12 (×9): qty 1

## 2020-10-12 MED ORDER — POTASSIUM CHLORIDE 20 MEQ PO PACK
40.0000 meq | PACK | Freq: Once | ORAL | Status: AC
  Administered 2020-10-12: 40 meq
  Filled 2020-10-12: qty 2

## 2020-10-12 MED ORDER — MAGNESIUM SULFATE 2 GM/50ML IV SOLN
2.0000 g | Freq: Once | INTRAVENOUS | Status: AC
  Administered 2020-10-12: 2 g via INTRAVENOUS
  Filled 2020-10-12: qty 50

## 2020-10-12 MED ORDER — GABAPENTIN 250 MG/5ML PO SOLN
300.0000 mg | Freq: Three times a day (TID) | ORAL | Status: DC
  Administered 2020-10-12 – 2020-10-15 (×9): 300 mg
  Filled 2020-10-12 (×12): qty 6

## 2020-10-12 MED ORDER — FREE WATER
350.0000 mL | Status: DC
  Administered 2020-10-12 – 2020-10-17 (×30): 350 mL

## 2020-10-12 MED ORDER — THIAMINE HCL 100 MG PO TABS
100.0000 mg | ORAL_TABLET | Freq: Every day | ORAL | Status: DC
  Administered 2020-10-15 – 2020-10-17 (×2): 100 mg
  Filled 2020-10-12 (×3): qty 1

## 2020-10-12 MED ORDER — DIPHENHYDRAMINE HCL 50 MG/ML IJ SOLN: 25.0000 mg | Freq: Once | INTRAMUSCULAR | Status: DC

## 2020-10-12 MED ORDER — LORAZEPAM 2 MG/ML IJ SOLN: 1.0000 mg | INTRAMUSCULAR | Status: DC | PRN

## 2020-10-12 MED ORDER — SODIUM CHLORIDE 0.9 % IV SOLN: 25.0000 mg | Freq: Once | INTRAVENOUS | Status: DC

## 2020-10-12 MED ORDER — HALOPERIDOL LACTATE 5 MG/ML IJ SOLN: 1.0000 mg | Freq: Four times a day (QID) | INTRAMUSCULAR | Status: DC | PRN

## 2020-10-12 MED ORDER — THIAMINE HCL 100 MG/ML IJ SOLN
100.0000 mg | Freq: Every day | INTRAMUSCULAR | Status: DC
  Administered 2020-10-13 – 2020-10-16 (×3): 100 mg via INTRAVENOUS
  Filled 2020-10-12 (×4): qty 2

## 2020-10-12 MED ORDER — LORAZEPAM 2 MG/ML IJ SOLN
1.0000 mg | INTRAMUSCULAR | Status: DC | PRN
  Administered 2020-10-12 – 2020-10-13 (×3): 1 mg via INTRAVENOUS
  Filled 2020-10-12 (×3): qty 1

## 2020-10-12 MED ORDER — SODIUM CHLORIDE 0.9 % IV SOLN
510.0000 mg | Freq: Once | INTRAVENOUS | Status: AC
  Administered 2020-10-12: 510 mg via INTRAVENOUS
  Filled 2020-10-12: qty 17

## 2020-10-12 MED ORDER — LORAZEPAM 1 MG PO TABS: 1.0000 mg | ORAL_TABLET | ORAL | Status: DC | PRN

## 2020-10-12 NOTE — Progress Notes (Addendum)
PROGRESS NOTE    Tony Long  FMB:[id number] DOB: 06-17-1976 DOA: 10/21/2020 PCP: Azzie Glatter, FNP  Brief Narrative: 45 year old male chronically ill secondary to uncontrolled diabetes mellitus and ongoing alcohol abuse, was admitted to [facility] on 4/9 with DKA and hypokalemia, suffered a PEA arrest on early 4/10 secondary to severe hypokalemia (cbg 21) and severe hypoglycemia achieved ROSC in 13 minutes, subsequently intubated and admitted to the ICU, post arrest had acute hypoxic respiratory failure with concern for aspiration pneumonitis, also has anoxic encephalopathy as well as multiple ischemic infarcts on MRI brain.  In the ICU also had CTA chest which showed mild very minor distal bilateral PE. -All this in the background of chronic hep C, alcohol and opiate abuse and uncontrolled diabetes mellitus -Extubated 4/13 and transferred to [facility] service 4/40 -Remains obtunded, NG tube placed and tube feeds started   Assessment & Plan:   S/p PEA arrest -Suspected to be secondary to severe hypoglycemia and hyperkalemia -Estimated 13 minutes downtime, CPR initiated immediately- found in PEA, received 0 defibrillations, suspected anoxic injury. CBG 21 Patient deemed not a candidate for TTM due to unclear downtime  -Was briefly on pressors, then weaned off - Echocardiogram 4/10-LVEF 70-17%, LV diastolic parameters normal, RV systolic function mildly reduced, no significant valve disease -Monitor electrolytes closely, monitor on telemetry  Acute Hypoxic Respiratory Failure in the setting of PEA arrest,  ? Aspiration Pneumonitis, & Minor Peripheral Bilateral LL Pulmonary Emboli -Intubated on 4/10 and extubated 4/13 -Imaging also concerning for suspected aspiration pneumonitis, currently on IV Unasyn day 5/7 -Bronchodilators as needed -On full dose Lovenox,  -Transition to [place] soon  Dysphagia -Secondary to anoxic encephalopathy, CVA etc. -SLP following, failed swallow eval today, start  tube feeds via NG tube  Anoxic encephalopathy Acute Ischemic CVA Alcohol abuse, risk of withdrawal, drinks 1/5 of liquor daily -Off Precedex drip last night -Day 7 of hospitalization do not suspect alcohol withdrawal anymore -Changed to Ativan every 4 as needed had low-dose Seroquel at bedtime if he has overnight agitation again -Decrease gabapentin dose -Continue high-dose thiamine -Continue therapy SLP, PT OT eval -Will need PEG tube unless he has considerable neurological improvement  Acute/subacute infarcts - MRI Brain 4/11 with numerous small acute/early subacute infarcts within bilateral cerebral hemispheres (embolic vs. Watershed ischemia) -Likely following PEA arrest -Neurology following, appreciate input, MRA ordered -Extubated now, on as needed Ativan -Continue full dose Lovenox  DKA  -On admission, resolved  Severe hypoglycemia Type 2 diabetes mellitus -Continue sliding scale insulin, restarted tube feeds, add 2 units of NovoLog every 4 -Hemoglobin A1c is 11.1  History of recurrent C Diff  -C Diff Ag positive, toxin negative, PCR positive  -Followed by ID, Dr. Baxter Flattery. He was initially diagnosed in November, has completed vanco x 2, dificid, then most recently vanco taper (started 3/16, should be on 1 capsule every other day for 4 weeks now, last day treatment 5/3). Referred to [name] for fecal transplant (appt at [facility] 5/23), considered for zinplava infusion.  -Continue vanco taper   Pancytopenia Chronic macrocytic anemia -Likely secondary to alcoholism, anemia panel suggestive of chronic disease and iron deficiency -Will give IV iron x1  Transaminitis Alcoholic liver disease and hep C -Continue to trend   DVT prophylaxis: Full dose Lovenox Code Status: Full code Family Communication: Left message for daughter yesterday, discussed with girlfriend at bedside Disposition Plan:  Status is: Inpatient  Remains inpatient appropriate because:Inpatient level of  care appropriate due to severity of illness   Dispo:  The patient is from: Home              Anticipated d/c is to: SNF              Patient currently is not medically stable to d/c.   Difficult to place patient No   Consultants:   PCCM Tx, NEuro   Procedures:   Antimicrobials:    Subjective: -Intermittent agitation and confusion, required Ativan x2  Objective: Vitals:   10/12/20 0500 10/12/20 0600 10/12/20 0800 10/12/20 1105  BP: (!) 107/92 109/89 103/68 94/67  Pulse: 99 98 96 95  Resp: 20 (!) 25 (!) 23 20  Temp:   97.6 F (36.4 C) (!) 97.4 F (36.3 C)  TempSrc:   Axillary Axillary  SpO2: 96% 94% 94% 98%  Weight: 72 kg     Height:        Intake/Output Summary (Last 24 hours) at 10/12/2020 1352 Last data filed at 10/12/2020 0205 Gross per 24 hour  Intake 120 ml  Output 1875 ml  Net -1755 ml   Filed Weights   10/06/20 0604 10/06/20 1445 10/12/20 0500  Weight: 63.5 kg 68 kg 72 kg    Examination:  General exam: Chronically ill young male, laying in bed, somnolent but arousable, mumbles few words, disoriented HEENT: NG tube with tube feeds infusing, left IJ central line noted CVS: S1-S2, regular rhythm Lungs: Poor air movement bilaterally, few scattered rhonchi Abdomen: Soft, nontender, bowel sounds present Extremities: No edema Neuro: Moves all extremities, poor effort, no overt localizing signs noted  Psych: Unable to assess     Data Reviewed:   CBC: Recent Labs  Lab 10/08/20 0450 10/08/20 2000 10/09/20 0435 10/10/20 0326 10/11/20 0517 10/12/20 0430  WBC 5.0  --  8.4 7.6 6.3 7.6  HGB 7.0* 8.8* 8.6* 8.2* 7.2* 7.3*  HCT 21.0* 26.5* 25.1* 25.1* 21.9* 22.9*  MCV 106.6*  --  102.4* 105.9* 106.8* 108.0*  PLT 55*  --  53* 67* 82* 176*   Basic Metabolic Panel: Recent Labs  Lab 10/08/20 0450 10/08/20 2000 10/09/20 0435 10/10/20 0326 10/11/20 0517 10/12/20 0430  NA 141  --  145 144 149* 148*  K 3.0*  --  3.8 3.0* 3.0* 3.1*  CL 108  --  111  111 113* 115*  CO2 28  --  29 26 21* 15*  GLUCOSE 151*  --  114* 189* 120* 160*  BUN 6  --  _0 CREATININE 0.74  --  0.56* 0.68 0.77 1.12  CALCIUM 7.5*  --  7.6* 7.7* 7.7* 8.0*  MG 1.8  --  1.9 1.7 1.9 1.8  PHOS 1.8* 3.6 3.2  --  4.2 4.6   GFR: Estimated Creatinine Clearance: 85.7 mL/min (by C-G formula based on SCr of 1.12 mg/dL). Liver Function Tests: Recent Labs  Lab 10/07/20 0405 10/09/20 0435 10/10/20 0326 10/11/20 0517 10/12/20 0430  AST 1,311* 158* 87* 67* 60*  ALT 270* 100* 78* 57* 56*  ALKPHOS 268* 234* 234* 184* 201*  BILITOT 0.9 1.3* 1.0 1.3* 2.9*  PROT 5.1* 4.8* 5.0* 4.9* 4.9*  ALBUMIN 2.2* 1.8* 1.7* 1.7* 1.6*   No results for input(s): LIPASE, AMYLASE in the last 168 hours. Recent Labs  Lab 10/07/20 0401  AMMONIA 24   Coagulation Profile: Recent Labs  Lab 10/07/20 1655 10/09/20 0435 10/10/20 0326 10/11/20 0517 10/12/20 0430  INR 1.5* 1.2 1.1 1.4* 1.2   Cardiac Enzymes: No results for input(s): CKTOTAL, CKMB, CKMBINDEX, TROPONINI in  the last 168 hours. BNP (last 3 results) No results for input(s): PROBNP in the last 8760 hours. HbA1C: No results for input(s): HGBA1C in the last 72 hours. CBG: Recent Labs  Lab 10/11/20 1622 10/11/20 2028 10/12/20 0148 10/12/20 0728 10/12/20 1213  GLUCAP 100* 127* 191* 205* 192*   Lipid Profile: No results for input(s): CHOL, HDL, LDLCALC, TRIG, CHOLHDL, LDLDIRECT in the last 72 hours. Thyroid Function Tests: No results for input(s): TSH, T4TOTAL, FREET4, T3FREE, THYROIDAB in the last 72 hours. Anemia Panel: Recent Labs    10/11/20 0500 10/11/20 1317  VITAMINB12  --  1,525*  FOLATE 16.6  --   FERRITIN 406*  --   TIBC NOT CALCULATED  --   IRON 11*  --   RETICCTPCT 2.1  --    Urine analysis:    Component Value Date/Time   COLORURINE YELLOW (A) 10/01/2020 2253   APPEARANCEUR CLEAR (A) 10/01/2020 2253   LABSPEC 1.017 10/03/2020 2253   PHURINE 6.0 10/23/2020 2253   GLUCOSEU >=500 (A)  10/07/2020 2253   HGBUR NEGATIVE 10/25/2020 2253   BILIRUBINUR NEGATIVE 10/15/2020 2253   BILIRUBINUR negative 06/12/2020 1541   BILIRUBINUR neg 02/29/2020 0918   KETONESUR 20 (A) 10/15/2020 2253   PROTEINUR NEGATIVE 10/24/2020 2253   UROBILINOGEN 0.2 06/12/2020 1541   UROBILINOGEN 1.0 12/11/2016 1020   NITRITE NEGATIVE 10/21/2020 2253   LEUKOCYTESUR NEGATIVE 10/09/2020 2253   Sepsis Labs: _0 (procalcitonin:4,lacticidven:4)  ) Recent Results (from the past 240 hour(s))  Resp Panel by RT-PCR (Flu A&B, Covid) Nasopharyngeal Swab     Status: None   Collection Time: 10/13/2020  9:13 PM   Specimen: Nasopharyngeal Swab; Nasopharyngeal(NP) swabs in vial transport medium  Result Value Ref Range Status   SARS Coronavirus 2 by RT PCR NEGATIVE NEGATIVE Final    Comment: (NOTE) SARS-CoV-2 target nucleic acids are NOT DETECTED.  The SARS-CoV-2 RNA is generally detectable in upper respiratory specimens during the acute phase of infection. The lowest concentration of SARS-CoV-2 viral copies this assay can detect is 138 copies/mL. A negative result does not preclude SARS-Cov-2 infection and should not be used as the sole basis for treatment or other patient management decisions. A negative result may occur with  improper specimen collection/handling, submission of specimen other than nasopharyngeal swab, presence of viral mutation(s) within the areas targeted by this assay, and inadequate number of viral copies(<138 copies/mL). A negative result must be combined with clinical observations, patient history, and epidemiological information. The expected result is Negative.  Fact Sheet for Patients:  EntrepreneurPulse.com.au  Fact Sheet for Healthcare Providers:  IncredibleEmployment.be  This test is no t yet approved or cleared by the [place] FDA and  has been authorized for detection and/or diagnosis of SARS-CoV-2 by FDA under an Emergency  Use Authorization (EUA). This EUA will remain  in effect (meaning this test can be used) for the duration of the COVID-19 declaration under Section 564(b)(1) of the Act, 21 U.S.C.section 360bbb-3(b)(1), unless the authorization is terminated  or revoked sooner.       Influenza A by PCR NEGATIVE NEGATIVE Final   Influenza B by PCR NEGATIVE NEGATIVE Final    Comment: (NOTE) The Xpert Xpress SARS-CoV-2/FLU/RSV plus assay is intended as an aid in the diagnosis of influenza from Nasopharyngeal swab specimens and should not be used as a sole basis for treatment. Nasal washings and aspirates are unacceptable for Xpert Xpress SARS-CoV-2/FLU/RSV testing.  Fact Sheet for Patients: EntrepreneurPulse.com.au  Fact Sheet for Healthcare Providers: IncredibleEmployment.be  This test  is not yet approved or cleared by the [place] and has been authorized for detection and/or diagnosis of SARS-CoV-2 by FDA under an Emergency Use Authorization (EUA). This EUA will remain in effect (meaning this test can be used) for the duration of the COVID-19 declaration under Section 564(b)(1) of the Act, 21 U.S.C. section 360bbb-3(b)(1), unless the authorization is terminated or revoked.  Performed at [facility], [address] [place], [place] [address]   Blood Culture (routine x 2)     Status: None   Collection Time: 10/02/2020  9:57 PM   Specimen: BLOOD  Result Value Ref Range Status   Specimen Description BLOOD  LEFT HAND  Final   Special Requests   Final    BOTTLES DRAWN AEROBIC AND ANAEROBIC Blood Culture adequate volume   Culture   Final    NO GROWTH 5 DAYS Performed at [name], [address] [place], [place] [address]    Report Status 10/10/2020 FINAL  Final  Blood Culture (routine x 2)     Status: None   Collection Time: 10/08/2020  9:57 PM   Specimen: BLOOD  Result Value Ref Range Status   Specimen Description BLOOD   LEFT HAND  Final   Special Requests   Final    BOTTLES DRAWN AEROBIC AND ANAEROBIC Blood Culture adequate volume   Culture   Final    NO GROWTH 5 DAYS Performed at [facility], [address] [place], [place] [address]    Report Status 10/10/2020 FINAL  Final  Urine culture     Status: Abnormal   Collection Time: 10/18/2020 10:53 PM   Specimen: In/Out Cath Urine  Result Value Ref Range Status   Specimen Description   Final    IN/OUT CATH URINE Performed at [facility], [address] [place], [place] [address]    Special Requests   Final    NONE Performed at [name], [address] [place], [place] [address]    Culture (A)  Final    4,000 COLONIES/mL STAPHYLOCOCCUS EPIDERMIDIS 100 COLONIES/mL ESCHERICHIA COLI    Report Status 10/08/2020 FINAL  Final   Organism ID, Bacteria STAPHYLOCOCCUS EPIDERMIDIS (A)  Final   Organism ID, Bacteria ESCHERICHIA COLI (A)  Final      Susceptibility   Escherichia coli - MIC*    AMPICILLIN <=2 SENSITIVE Sensitive     CEFAZOLIN <=4 SENSITIVE Sensitive     CEFEPIME <=0.12 SENSITIVE Sensitive     CEFTRIAXONE <=0.25 SENSITIVE Sensitive     CIPROFLOXACIN <=0.25 SENSITIVE Sensitive     GENTAMICIN <=1 SENSITIVE Sensitive     IMIPENEM <=0.25 SENSITIVE Sensitive     NITROFURANTOIN <=16 SENSITIVE Sensitive     TRIMETH/SULFA <=20 SENSITIVE Sensitive     AMPICILLIN/SULBACTAM <=2 SENSITIVE Sensitive     PIP/TAZO <=4 SENSITIVE Sensitive     * 100 COLONIES/mL ESCHERICHIA COLI   Staphylococcus epidermidis - MIC*    CIPROFLOXACIN <=0.5 SENSITIVE Sensitive     GENTAMICIN <=0.5 SENSITIVE Sensitive     NITROFURANTOIN <=16 SENSITIVE Sensitive     OXACILLIN >=4 RESISTANT Resistant     TETRACYCLINE <=1 SENSITIVE Sensitive     VANCOMYCIN 1 SENSITIVE Sensitive     TRIMETH/SULFA <=10 SENSITIVE Sensitive     CLINDAMYCIN 2 INTERMEDIATE Intermediate     RIFAMPIN <=0.5 SENSITIVE Sensitive     Inducible Clindamycin NEGATIVE  Sensitive     * 4,000 COLONIES/mL STAPHYLOCOCCUS EPIDERMIDIS  MRSA PCR Screening  Status: Abnormal   Collection Time: 10/06/20  1:10 AM   Specimen: Nasal Mucosa; Nasopharyngeal  Result Value Ref Range Status   MRSA by PCR POSITIVE (A) NEGATIVE Final    Comment:        The GeneXpert MRSA Assay (FDA approved for NASAL specimens only), is one component of a comprehensive MRSA colonization surveillance program. It is not intended to diagnose MRSA infection nor to guide or monitor treatment for MRSA infections. Oren Beckmann RN [phone] 10/06/2020 JG Performed at [facility], [place], [place] [address]   C Difficile Quick Screen w PCR reflex     Status: Abnormal   Collection Time: 10/06/20  1:30 AM   Specimen: STOOL  Result Value Ref Range Status   C Diff antigen POSITIVE (A) NEGATIVE Final   C Diff toxin NEGATIVE NEGATIVE Final   C Diff interpretation Results are indeterminate. See PCR results.  Final    Comment: Performed at [facility], [address] [place], [place] [address]  Gastrointestinal Panel by PCR , Stool     Status: None   Collection Time: 10/06/20  1:30 AM   Specimen: Nasal Mucosa; Stool  Result Value Ref Range Status   Campylobacter species NOT DETECTED NOT DETECTED Final   Plesimonas shigelloides NOT DETECTED NOT DETECTED Final   Salmonella species NOT DETECTED NOT DETECTED Final   Yersinia enterocolitica NOT DETECTED NOT DETECTED Final   Vibrio species NOT DETECTED NOT DETECTED Final   Vibrio cholerae NOT DETECTED NOT DETECTED Final   Enteroaggregative E coli (EAEC) NOT DETECTED NOT DETECTED Final   Enteropathogenic E coli (EPEC) NOT DETECTED NOT DETECTED Final   Enterotoxigenic E coli (ETEC) NOT DETECTED NOT DETECTED Final   Shiga like toxin producing E coli (STEC) NOT DETECTED NOT DETECTED Final   Shigella/Enteroinvasive E coli (EIEC) NOT DETECTED NOT DETECTED Final   Cryptosporidium NOT DETECTED NOT DETECTED  Final   Cyclospora cayetanensis NOT DETECTED NOT DETECTED Final   Entamoeba histolytica NOT DETECTED NOT DETECTED Final   Giardia lamblia NOT DETECTED NOT DETECTED Final   Adenovirus F40/41 NOT DETECTED NOT DETECTED Final   Astrovirus NOT DETECTED NOT DETECTED Final   Norovirus GI/GII NOT DETECTED NOT DETECTED Final   Rotavirus A NOT DETECTED NOT DETECTED Final   Sapovirus (I, II, IV, and V) NOT DETECTED NOT DETECTED Final    Comment: Performed at [name], [address] [place], [place] [address]  C. Diff by PCR, Reflexed     Status: Abnormal   Collection Time: 10/06/20  1:30 AM  Result Value Ref Range Status   Toxigenic C. Difficile by PCR POSITIVE (A) NEGATIVE Final    Comment: Positive for toxigenic C. difficile with little to no toxin production. Only treat if clinical presentation suggests symptomatic illness. Performed at [facility], [address] [place], [place] [address]   Culture, Respiratory w Gram Stain     Status: None   Collection Time: 10/07/20  5:00 AM   Specimen: Tracheal Aspirate; Respiratory  Result Value Ref Range Status   Specimen Description   Final    TRACHEAL ASPIRATE Performed at [name], [address] [place], [place] [address]    Special Requests   Final    NONE Performed at [facility], [place],  [address]    Gram Stain   Final    FEW WBC PRESENT,BOTH PMN AND MONONUCLEAR RARE SQUAMOUS EPITHELIAL CELLS PRESENT NO ORGANISMS SEEN  Culture   Final    FEW Consistent with normal respiratory flora. No Pseudomonas species isolated Performed at [facility] [address], [place], [place] [address]    Report Status 10/09/2020 FINAL  Final         Radiology Studies: DG Abd 1 View  Result Date: 10/11/2020 CLINICAL DATA:  Nasogastric tube placement EXAM: ABDOMEN - 1 VIEW COMPARISON:  October 07, 2020 FINDINGS: Nasogastric tube tip and side port in stomach. No  bowel dilatation or air-fluid level evident to suggest bowel obstruction. No free air seen on supine examination. Lung bases clear. IMPRESSION: Nasogastric tube tip and side port in stomach. No bowel obstruction or free air evident on supine examination. Electronically Signed   By: Lowella Grip III M.D.   On: 10/11/2020 15:35   MR ANGIO HEAD WO CONTRAST  Result Date: 10/12/2020 CLINICAL DATA:  Neuro deficit, acute, stroke suspected. Follow-up CVA. EXAM: MRA HEAD WITHOUT CONTRAST TECHNIQUE: Angiographic images of the Circle of Willis were obtained using MRA technique without intravenous contrast. COMPARISON:  Brain MRI 10/08/2020. FINDINGS: The examination is severely motion degraded at the level of the M2 and more distal MCA branches, and at the level of the anterior cerebral arteries, precluding evaluation of these vessels. The intracranial internal carotid arteries and M1 middle cerebral arteries are patent without significant stenosis. The vertebral arteries and basilar artery are patent without significant stenosis. Fenestration within the mid basilar artery. The posterior cerebral arteries are patent proximally without significant stenosis. No appreciable intracranial aneurysm. IMPRESSION: Severe motion degradation precludes evaluation of the M2 and more distal middle cerebral artery branches, and also precludes evaluation of the anterior cerebral arteries. The intracranial internal carotid arteries and M1 middle cerebral arteries are patent without significant stenosis. The vertebral arteries and basilar artery are patent without significant stenosis. The posterior cerebral arteries are patent proximally without significant stenosis. Electronically Signed   By: Kellie Simmering DO   On: 10/12/2020 13:20   Scheduled Meds: . chlorhexidine gluconate (MEDLINE KIT)  15 mL Mouth Rinse BID  . Chlorhexidine Gluconate Cloth  6 each Topical Daily  . enoxaparin (LOVENOX) injection  1 mg/kg Subcutaneous Q12H  .  famotidine  20 mg Per Tube BID  . folic acid  1 mg Oral Daily  . free water  350 mL Per Tube Q4H  . gabapentin  600 mg Per Tube TID  . insulin aspart  0-6 Units Subcutaneous Q4H  . [START ON 10/13/2020] multivitamin with minerals  1 tablet Per Tube Daily  . sodium chloride flush  10-40 mL Intracatheter Q12H  . [START ON 10/13/2020] thiamine  100 mg Per Tube Daily   Or  . [START ON 10/13/2020] thiamine  100 mg Intravenous Daily  . vancomycin  125 mg Per Tube QODAY   Continuous Infusions: . sodium chloride Stopped (10/08/20 1436)  . sodium chloride    . feeding supplement (GLUCERNA 1.5 CAL) 1,000 mL (10/11/20 1610)     LOS: 7 days    Time spent: 57mn  PDomenic Polite MD [facility]  10/12/2020, 1:52 PM

## 2020-10-12 NOTE — Progress Notes (Signed)
PHARMACY CONSULT NOTE - FOLLOW UP  Pharmacy Consult for Electrolyte Monitoring and Replacement   Recent Labs: Potassium (mmol/L)  Date Value  10/12/2020 3.1 (L)   Magnesium (mg/dL)  Date Value  05/39/7673 1.8   Calcium (mg/dL)  Date Value  41/93/7902 8.0 (L)   Albumin (g/dL)  Date Value  40/97/3532 1.6 (L)  02/29/2020 4.7   Phosphorus (mg/dL)  Date Value  99/24/2683 4.6   Sodium (mmol/L)  Date Value  10/12/2020 148 (H)  02/29/2020 145 (H)     Assessment: Caucasian male with medical history significant for C. difficile diarrhea, type II diabetes mellitus, hypertension, hepatitis C, dyslipidemia and alcohol abuse, who presented to the emergency room with acute onset of hyperglycemia with altered mental status with mild confusion. Pharmacy has been consulted for electrolyte monitoring and replacement.   Free water 98mL q4h>235mL q4h Glucerna @60mL /hr  Goal of Therapy:  Electrolytes WNL   Plan:   K 3.1 - MD ordered PO KCl  Mg 1.8 - MD ordered Mg sulfate 2g IV x1  Na slightly decreased to 148 - continue free water to q4h  Monitor electrolytes daily and replace as needed   , PharmD Pharmacy Resident  10/12/2020 6:34 AM

## 2020-10-12 NOTE — Progress Notes (Signed)
SLP Cancellation Note  Patient Details Name: Tony Long MRN: [id number] DOB: Oct 24, 1975   Cancelled treatment:       Reason Eval/Treat Not Completed: Patient not medically ready;Patient's level of consciousness. RN and NT indicate pt continues to be poorly responsive, not following commands. Pt is inappropriate for PO trials again today. Will continue efforts.   Jaquana Geiger B. Murvin Natal, [facility], CCC-SLP Speech Language Pathologist  [facility] 10/12/2020, 11:04 AM

## 2020-10-12 NOTE — Progress Notes (Signed)
Inpatient Diabetes Program Recommendations  AACE/ADA: New Consensus Statement on Inpatient Glycemic Control (2015)  Target Ranges:  Prepandial:   less than 140 mg/dL      Peak postprandial:   less than 180 mg/dL (1-2 hours)      Critically ill patients:  140 - 180 mg/dL   Lab Results  Component Value Date   GLUCAP 205 (H) 10/12/2020   HGBA1C 11.1 (H) 10/06/2020    Review of Glycemic Control Results for Tony Long, Tony Long (MRN [id number]) as of 10/12/2020 10:06  Ref. Range 10/11/2020 20:28 10/12/2020 01:48 10/12/2020 07:28  Glucose-Capillary Latest Ref Range: 70 - 99 mg/dL 240 (H) 973 (H) 532 (H)   Diabetes history: DM 2 Outpatient Diabetes medications:  Lantus 30 units daily, Humalog 10 units tid with meals Current orders for Inpatient glycemic control:  Novolog very sensitive q 4 hours   Inpatient Diabetes Program Recommendations:    Blood sugars trending up with tube feeds.  Consider adding Novolog tube feed coverage 2 units q 4 hours.   Thanks  Beryl Meager, RN, BC-ADM Inpatient Diabetes Coordinator Pager [phone] (8a-5p)

## 2020-10-12 NOTE — Progress Notes (Signed)
NEUROLOGY CONSULTATION PROGRESS NOTE   Date of service: October 12, 2020 Patient Name: Tony Long MRN:  [id number] DOB:  Oct 22, 1975  Brief HPI   Tony Long is a 45 y.o. male with PMH significant for EtOH use, DM2, Cdiff diarrhea, Hep C, HTN, HLD, IV Drug use, who initially presented on 4/8 with hyperglycemia, dehyration and confusion. Treated as DKA with uncontrolled DM2, recurrent Cdiff. He was placed on insulin gtt per Endo tool protocol for DKA and transitioned off it and transferred to floor.  At 0300 on 10/07/20, he was found to be unresponsive in PEA arrest with blood glucose of 21. CPR and given 1mg  of Epinephrine. ROSC achieved in 13 mins. He was intubated and transferred to the ICU. Downtime is somewhat unclear. Some concern for posturing was noted per notes. He did not undergo TTM.  Workup with MRI Brain with BL white matter punctate infarcts, no obvious signs of anoxic injury. rEEG with severe diffuse encephalopathy. He was weaned off propofol and fentanyl and eventually extubated.    Interval Hx   Since extubation, he has had waxing and waning mentation. Fiance at bedside reports that on 4/13, he was opening eyes, following commands, talking to her and responding to her questions.  On my evaluation, he does open eyes partially when his fiance calls out his name. He briefly makes eye contact with me. He is moving all arms and legs. He does not follow any commands for me.  Vitals   Vitals:   10/12/20 0200 10/12/20 0300 10/12/20 0500 10/12/20 0600  BP: 98/72 96/65 (!) 107/92 109/89  Pulse: (!) 103 (!) 103 99 98  Resp: 19 (!) 31 20 (!) 25  Temp:      TempSrc:      SpO2: 96% 96% 96% 94%  Weight:   72 kg   Height:         Body mass index is 22.14 kg/m.  Physical Exam   General: Laying comfortably in bed; in no acute distress.  HENT: Normal oropharynx and mucosa. Normal external appearance of ears and nose.  Neck: Supple, no pain or tenderness  CV: No JVD. No  peripheral edema.  Pulmonary: Symmetric Chest rise. Normal respiratory effort.  Abdomen: Soft to touch, non-tender.  Ext: No cyanosis, edema, or deformity  Skin: No rash. Normal palpation of skin.   Musculoskeletal: Normal digits and nails by inspection. No clubbing.   Neurologic Examination  Mental status/Cognition: Opens eyes partially to voice. Grimaces to noxious stimuli. With nares stimulation, he grimaces, moves his head left and right and pulls his R hand up to pull the Qtip out of his nostril and sneezing.  Speech/language: Mumbles when asked a question.  Cranial nerves:   CN II Pupils equal and sluggishly reactive to light,unable to assess for blink to threat as he closes his eyes.   CN III,IV,VI EOM intact to dolls eyes, no gaze preference or deviation, no nystagmus. Does make brief eye contact with me and his fiance.   CN V    CN VII no asymmetry, no nasolabial fold flattening   CN VIII Turns head towards speech.   CN IX & X    CN XI    CN XII    Motor:  Muscle bulk: poor, paratonia in BL upper extremities, normal tone in BL lower extremities.  Keeps both arms and hands up off the bed on his own and localizes in BL upper extremities.  Moves BL lower extremities to pinch on the inner thigh  and attempts to pulls his legs up. Did not lift his legs off the bed for me.  Reflexes:  Right Left Comments  Pectoralis      Biceps (C5/6) 2 2   Brachioradialis (C5/6) 2 2    Triceps (C6/7) 2 2    Patellar (L3/4) 2 2    Achilles (S1)      Hoffman      Plantar     Jaw jerk    Sensation: Grimaces to pain in all extremities.  Coordination/Complex Motor:  Unable to assess but no obvious ataxia noted when he attempts to pull the qtip out of his nostril.  Labs   Basic Metabolic Panel:  Lab Results  Component Value Date   NA 148 (H) 10/12/2020   K 3.1 (L) 10/12/2020   CO2 15 (L) 10/12/2020   GLUCOSE 160 (H) 10/12/2020   BUN 16 10/12/2020   CREATININE 1.12 10/12/2020    CALCIUM 8.0 (L) 10/12/2020   GFRNONAA >60 10/12/2020   GFRAA 143 02/29/2020   HbA1c:  Lab Results  Component Value Date   HGBA1C 11.1 (H) 10/06/2020   LDL:  Lab Results  Component Value Date   LDLCALC 50 02/29/2020   Urine Drug Screen:     Component Value Date/Time   LABOPIA NONE DETECTED 04/07/2020 0922   LABOPIA NONE DETECTED 03/30/2018 0859   COCAINSCRNUR NONE DETECTED 04/07/2020 0922   LABBENZ NONE DETECTED 04/07/2020 0922   LABBENZ NONE DETECTED 03/30/2018 0859   AMPHETMU NONE DETECTED 04/07/2020 0922   AMPHETMU NONE DETECTED 03/30/2018 0859   THCU NONE DETECTED 04/07/2020 0922   THCU NONE DETECTED 03/30/2018 0859   LABBARB NONE DETECTED 04/07/2020 0922   LABBARB NONE DETECTED 03/30/2018 0859    Alcohol Level     Component Value Date/Time   [facility]  10/25/2008 1940    <5        LOWEST DETECTABLE LIMIT FOR SERUM ALCOHOL IS 5 mg/dL FOR MEDICAL PURPOSES ONLY   No results found for: PHENYTOIN, ZONISAMIDE, LAMOTRIGINE, LEVETIRACETA No results found for: PHENYTOIN, PHENOBARB, VALPROATE, CBMZ  Imaging and Diagnostic studies   CT Head without contrast: CTH was negative for a large hypodensity concerning for a large territory infarct or hyperdensity concerning for an ICH  MRI Brain  Numerous small acute/early subacute infarcts (measuring up to 6 mm) within the bilateral cerebral hemispheres, within multiple vascular territories. The infarcts predominantly affect the cerebral white matter. The distribution is highly suspicious for embolic type infarcts. However, given the provided history, there may be superimposed small foci of watershed ischemia.  Background mild cerebral white matter chronic small vessel ischemic disease.  Mild cerebral and cerebellar atrophy, advanced for age.  Mild paranasal sinus mucosal thickening at the imaged levels.  Bilateral mastoid effusions.  Nonspecific 17 mm round T2 hyperintense lesion, also demonstrating restricted  diffusion, within the left maxillofacial soft tissues. This lesion is incompletely assessed on the current exam. Recommend direct visualization. Additionally, a dedicated maxillofacial CT with contrast may be considered for further evaluation.  rEEG:  CLASSIFICATION (EEG IMPRESSION): Abnormal significance III (obtunded) 1. Burst suppression -> continuous slow, generalized 2. Intermittent rhythmic slow, generalized  CLINICAL INTERPRETATION: There is evidence of a severe diffuse encephalopathy that is non-specific and can be seen with powerful sedating medications including fentanyl and propofol. There was no evidence to support a diagnosis of seizures on this recording.  Impression   Tony Long is a 45 y.o. male with PMH significant for EtOH use, DM2, Cdiff diarrhea, Hep C,  HTN, HLD, IV Drug use, who was found to be unresponsive in PEA arrest with blood glucose of 21. CPR and given 1mg  of Epinephrine. ROSC achieved in 13 mins. He was intubated and transferred to the ICU. Downtime is somewhat unclear. Some concern for posturing was noted per notes. He did not undergo TTM. Workup with MRI Brain with BL white matter punctate infarcts, no obvious signs of anoxic injury. rEEG with severe diffuse encephalopathy. He was weaned off propofol and fentanyl and eventually extubated.  Exam with clear evidence of higher cerebral function. He was able to communicate with his fiance after extubation and asnwered questions and followed commands for her. He shows purposeful movement with localization in BL upper extremities, brief eye contact and moves all extremities spontaneously.  I suspect that his waxing and waning encephalopathy is likely a combination of recent hypoxic injury coupled with ICU delirium and chronic comorbidities including Diabetes, Liver injury due to EtOH and Hep C.  I spoke to family today and I did discuss with them that he has shown encouraging signs. The fact that he is  communicating, he is not a vegetative state. I discussed that he probably needs time and rest to see how much he recovers, we might have a good idea around a couple month from the event.  Recommendations  - I agree with supportive care with potential PEG tube if he continues to have poor PO intake. - Delirium precautions. - Will get MR Angio head w/o contrast and carotid duplex, specifically to rule out any large vessel stenosis but I think the noted tiny punctate BL white matter strokes are likely due to PEA arrest and CPR. The pattern is not particilarly concerning for watershed infarcts. - Follow up with Neurology and Neurocognitive testing in a couple months. ______________________________________________________________________   Thank you for the opportunity to take part in the care of this patient. If you have any further questions, please contact the neurology consultation attending.  Signed,  [place] [facility] Pager Number Erick Blinks

## 2020-10-13 ENCOUNTER — Inpatient Hospital Stay: Payer: Medicaid Other

## 2020-10-13 DIAGNOSIS — B182 Chronic viral hepatitis C: Secondary | ICD-10-CM | POA: Diagnosis not present

## 2020-10-13 DIAGNOSIS — J9601 Acute respiratory failure with hypoxia: Secondary | ICD-10-CM | POA: Diagnosis not present

## 2020-10-13 DIAGNOSIS — E0810 Diabetes mellitus due to underlying condition with ketoacidosis without coma: Secondary | ICD-10-CM | POA: Diagnosis not present

## 2020-10-13 LAB — COMPREHENSIVE METABOLIC PANEL
ALT: 46 U/L — ABNORMAL HIGH (ref 0–44)
AST: 30 U/L (ref 15–41)
Albumin: 1.7 g/dL — ABNORMAL LOW (ref 3.5–5.0)
Alkaline Phosphatase: 227 U/L — ABNORMAL HIGH (ref 38–126)
Anion gap: 9 (ref 5–15)
BUN: 14 mg/dL (ref 6–20)
CO2: 21 mmol/L — ABNORMAL LOW (ref 22–32)
Calcium: 8.5 mg/dL — ABNORMAL LOW (ref 8.9–10.3)
Chloride: 119 mmol/L — ABNORMAL HIGH (ref 98–111)
Creatinine, Ser: 0.84 mg/dL (ref 0.61–1.24)
GFR, Estimated: >60 mL/min (ref >60)
Glucose, Bld: 283 mg/dL — ABNORMAL HIGH (ref 70–99)
Potassium: 2.9 mmol/L — ABNORMAL LOW (ref 3.5–5.1)
Sodium: 149 mmol/L — ABNORMAL HIGH (ref 135–145)
Total Bilirubin: 1.6 mg/dL — ABNORMAL HIGH (ref 0.3–1.2)
Total Protein: 5.2 g/dL — ABNORMAL LOW (ref 6.5–8.1)

## 2020-10-13 LAB — LIPID PANEL
Cholesterol: 120 mg/dL (ref 0–200)
HDL: 11 mg/dL — ABNORMAL LOW (ref >40)
LDL Cholesterol: 75 mg/dL (ref 0–99)
Total CHOL/HDL Ratio: 10.9 RATIO
Triglycerides: 170 mg/dL — ABNORMAL HIGH (ref <150)
VLDL: 34 mg/dL (ref 0–40)

## 2020-10-13 LAB — PHOSPHORUS: Phosphorus: 4 mg/dL (ref 2.5–4.6)

## 2020-10-13 LAB — BASIC METABOLIC PANEL
Anion gap: 6 (ref 5–15)
BUN: 14 mg/dL (ref 6–20)
CO2: 21 mmol/L — ABNORMAL LOW (ref 22–32)
Calcium: 8.3 mg/dL — ABNORMAL LOW (ref 8.9–10.3)
Chloride: 120 mmol/L — ABNORMAL HIGH (ref 98–111)
Creatinine, Ser: 0.72 mg/dL (ref 0.61–1.24)
GFR, Estimated: >60 mL/min (ref >60)
Glucose, Bld: 194 mg/dL — ABNORMAL HIGH (ref 70–99)
Potassium: 2.9 mmol/L — ABNORMAL LOW (ref 3.5–5.1)
Sodium: 147 mmol/L — ABNORMAL HIGH (ref 135–145)

## 2020-10-13 LAB — GLUCOSE, CAPILLARY
Glucose-Capillary: 167 mg/dL — ABNORMAL HIGH (ref 70–99)
Glucose-Capillary: 293 mg/dL — ABNORMAL HIGH (ref 70–99)
Glucose-Capillary: 309 mg/dL — ABNORMAL HIGH (ref 70–99)
Glucose-Capillary: 343 mg/dL — ABNORMAL HIGH (ref 70–99)
Glucose-Capillary: 349 mg/dL — ABNORMAL HIGH (ref 70–99)
Glucose-Capillary: 367 mg/dL — ABNORMAL HIGH (ref 70–99)
Glucose-Capillary: 59 mg/dL — ABNORMAL LOW (ref 70–99)
Glucose-Capillary: 59 mg/dL — ABNORMAL LOW (ref 70–99)
Glucose-Capillary: 67 mg/dL — ABNORMAL LOW (ref 70–99)
Glucose-Capillary: 80 mg/dL (ref 70–99)
Glucose-Capillary: 87 mg/dL (ref 70–99)

## 2020-10-13 LAB — BLOOD GAS, ARTERIAL
Acid-base deficit: 5.4 mmol/L — ABNORMAL HIGH (ref 0.0–2.0)
Bicarbonate: 18.2 mmol/L — ABNORMAL LOW (ref 20.0–28.0)
FIO2: 1
O2 Saturation: 99.9 %
Patient temperature: 37
pCO2 arterial: 28 mmHg — ABNORMAL LOW (ref 32.0–48.0)
pH, Arterial: 7.42 (ref 7.350–7.450)
pO2, Arterial: 315 mmHg — ABNORMAL HIGH (ref 83.0–108.0)

## 2020-10-13 LAB — CBC
HCT: 24.5 % — ABNORMAL LOW (ref 39.0–52.0)
Hemoglobin: 7.9 g/dL — ABNORMAL LOW (ref 13.0–17.0)
MCH: 34.2 pg — ABNORMAL HIGH (ref 26.0–34.0)
MCHC: 32.2 g/dL (ref 30.0–36.0)
MCV: 106.1 fL — ABNORMAL HIGH (ref 80.0–100.0)
Platelets: 92 10*3/uL — ABNORMAL LOW (ref 150–400)
RBC: 2.31 MIL/uL — ABNORMAL LOW (ref 4.22–5.81)
RDW: 17.2 % — ABNORMAL HIGH (ref 11.5–15.5)
WBC: 8.1 10*3/uL (ref 4.0–10.5)
nRBC: 0.5 % — ABNORMAL HIGH (ref 0.0–0.2)

## 2020-10-13 LAB — PROTIME-INR
INR: 1.1 (ref 0.8–1.2)
Prothrombin Time: 14.6 seconds (ref 11.4–15.2)

## 2020-10-13 LAB — MAGNESIUM: Magnesium: 2.1 mg/dL (ref 1.7–2.4)

## 2020-10-13 MED ORDER — INSULIN GLARGINE 100 UNIT/ML ~~LOC~~ SOLN
15.0000 [IU] | Freq: Every day | SUBCUTANEOUS | Status: DC
  Administered 2020-10-13: 15 [IU] via SUBCUTANEOUS
  Filled 2020-10-13 (×2): qty 0.15

## 2020-10-13 MED ORDER — LORAZEPAM 2 MG/ML IJ SOLN: 1.0000 mg | INTRAMUSCULAR | Status: DC | PRN

## 2020-10-13 MED ORDER — SODIUM CHLORIDE 0.9 % IV BOLUS
500.0000 mL | Freq: Once | INTRAVENOUS | Status: AC
  Administered 2020-10-13: 500 mL via INTRAVENOUS

## 2020-10-13 MED ORDER — HALOPERIDOL LACTATE 5 MG/ML IJ SOLN: 1.0000 mg | Freq: Four times a day (QID) | INTRAMUSCULAR | Status: DC | PRN

## 2020-10-13 MED ORDER — QUETIAPINE FUMARATE 25 MG PO TABS
25.0000 mg | ORAL_TABLET | Freq: Every day | ORAL | Status: DC
  Administered 2020-10-14: 25 mg via ORAL
  Filled 2020-10-13: qty 1

## 2020-10-13 MED ORDER — POTASSIUM CHLORIDE 20 MEQ PO PACK: 40.0000 meq | PACK | Freq: Once | ORAL | Status: DC

## 2020-10-13 MED ORDER — POTASSIUM CHLORIDE 20 MEQ PO PACK
40.0000 meq | PACK | Freq: Once | ORAL | Status: AC
  Administered 2020-10-13: 40 meq via ORAL
  Filled 2020-10-13: qty 2

## 2020-10-13 MED ORDER — FOLIC ACID 1 MG PO TABS
1.0000 mg | ORAL_TABLET | Freq: Every day | ORAL | Status: DC
  Administered 2020-10-14 – 2020-10-17 (×4): 1 mg
  Filled 2020-10-13 (×4): qty 1

## 2020-10-13 MED ORDER — POTASSIUM CHLORIDE 20 MEQ PO PACK
40.0000 meq | PACK | Freq: Four times a day (QID) | ORAL | Status: AC
  Administered 2020-10-13 – 2020-10-14 (×2): 40 meq via ORAL
  Filled 2020-10-13 (×2): qty 2

## 2020-10-13 MED ORDER — LOPERAMIDE HCL 1 MG/7.5ML PO SUSP
1.0000 mg | Freq: Once | ORAL | Status: AC
  Administered 2020-10-13: 1 mg
  Filled 2020-10-13: qty 7.5

## 2020-10-13 MED ORDER — LORAZEPAM 2 MG/ML IJ SOLN
1.0000 mg | Freq: Four times a day (QID) | INTRAMUSCULAR | Status: DC | PRN
  Administered 2020-10-13 – 2020-10-17 (×8): 1 mg via INTRAVENOUS
  Filled 2020-10-13 (×8): qty 1

## 2020-10-13 MED ORDER — QUETIAPINE FUMARATE 25 MG PO TABS
50.0000 mg | ORAL_TABLET | Freq: Every day | ORAL | Status: DC
  Administered 2020-10-13: 50 mg via ORAL
  Filled 2020-10-13: qty 2

## 2020-10-13 MED ORDER — ENOXAPARIN SODIUM 80 MG/0.8ML ~~LOC~~ SOLN
1.0000 mg/kg | Freq: Two times a day (BID) | SUBCUTANEOUS | Status: DC
  Administered 2020-10-13 – 2020-10-17 (×8): 62.5 mg via SUBCUTANEOUS
  Filled 2020-10-13 (×8): qty 0.8

## 2020-10-13 MED ORDER — POTASSIUM CHLORIDE 2 MEQ/ML IV SOLN
INTRAVENOUS | Status: AC
  Filled 2020-10-13 (×3): qty 1000

## 2020-10-13 MED ORDER — DEXTROSE 50 % IV SOLN
INTRAVENOUS | Status: AC
  Administered 2020-10-13: 50 mL via INTRAVENOUS
  Filled 2020-10-13: qty 50

## 2020-10-13 NOTE — Progress Notes (Addendum)
PROGRESS NOTE    Tony Long  GEX:[id number] DOB: Jun 27, 1976 DOA: 10/14/2020 PCP: Azzie Glatter, FNP  Brief Narrative: 45 year old male chronically ill secondary to uncontrolled diabetes mellitus and ongoing alcohol abuse, was admitted to [facility] on 4/9 with DKA and hypokalemia, suffered a PEA arrest on early 4/10 secondary to severe hypokalemia, K<2 and severe hypoglycemia (CBG 21) achieved ROSC in 13 minutes, subsequently intubated and admitted to the ICU, post arrest had acute hypoxic respiratory failure with concern for aspiration pneumonitis, also has anoxic encephalopathy as well as multiple ischemic infarcts on MRI brain.  In the ICU also had CTA chest which showed mild very minor distal bilateral PE. -All this in the background of chronic hep C, alcohol and opiate abuse and uncontrolled diabetes mellitus -Extubated 4/13 and transferred to [facility] service 4/14 -Remains obtunded, NG tube placed and tube feeds started   Assessment & Plan:   S/p PEA arrest -Suspected to be secondary to severe hypoglycemia and hypokalemia -Estimated 13 minutes downtime, CPR initiated immediately- found in PEA, received 0 defibrillations, suspected anoxic injury. CBG 21 Patient deemed not a candidate for TTM due to unclear downtime  -Was briefly on pressors, then weaned off - Echocardiogram 4/10-LVEF 01-02%, LV diastolic parameters normal, RV systolic function mildly reduced, no significant valve disease -Monitor electrolytes closely, monitor on telemetry  Acute Hypoxic Respiratory Failure in the setting of PEA arrest, Aspiration Pneumonitis, & Minor Peripheral Bilateral LL Pulmonary Emboli -Intubated on 4/10 and extubated 4/13 -Imaging also concerning for suspected aspiration pneumonitis, currently on IV Unasyn day 6/7 -Bronchodilators as needed -On full dose Lovenox,  -Transition to [place] after PEG tube  Dysphagia -Secondary to anoxic encephalopathy, CVA etc. -SLP following, failed swallow eval  4/14, started tube feeds, anticipate he will need PEG tube, await repeat SLP eval early next week  Anoxic encephalopathy Acute Ischemic CVA Alcohol abuse, risk of withdrawal, drinks 1/5 of liquor daily -Off Precedex drip last night -Day 8 of hospitalization do not suspect alcohol withdrawal anymore -Changed to Ativan every 6 h PRN, add low-dose Seroquel qpm and in am -continue gabapentin  -Continue high-dose thiamine -Continue therapy SLP, PT OT eval -Anticipate need for PEG tube, await SLP eval early next week  Acute/subacute infarcts - MRI Brain 4/11 with numerous small acute/early subacute infarcts within bilateral cerebral hemispheres (embolic vs. Watershed ischemia) -Likely following PEA arrest -Neurology following, appreciate input, MRA ordered -Extubated now, on as needed Ativan -Continue full dose Lovenox  DKA  -On admission, resolved  Severe hypoglycemia Type 2 diabetes mellitus -Continue sliding scale insulin, restarted tube feeds, started 2 units of NovoLog every 4, add Lantus 15 units daily -Hemoglobin A1c is 11.1  History of recurrent C Diff  -C Diff Ag positive, toxin negative, PCR positive  -Followed by ID, Dr. Baxter Flattery. He was initially diagnosed in November, has completed vanco x 2, dificid, then most recently vanco taper (started 3/16, should be on 1 capsule every other day for 4 weeks now, last day treatment 5/3). Referred to [facility] for fecal transplant (appt at [facility] 5/23), considered for zinplava infusion.  -Continue vanco taper  -Now with worsening diarrhea from tube feeds, add imodium x1  Pancytopenia Chronic macrocytic anemia -Likely secondary to alcoholism, anemia panel suggestive of chronic disease and iron deficiency -Given IV iron yesterday, hemoglobin up to 7.9, continue to monitor  Transaminitis Alcoholic liver disease and hep C -Continue to trend   DVT prophylaxis: Full dose Lovenox Code Status: Full code Family Communication: Discussed with  girlfriend bedside  yesterday, no family at bedside today Disposition Plan:  Status is: Inpatient  Remains inpatient appropriate because:Inpatient level of care appropriate due to severity of illness   Dispo: The patient is from: Home              Anticipated d/c is to: SNF              Patient currently is not medically stable to d/c.   Difficult to place patient No   Consultants:   PCCM Tx, NEuro   Procedures:   Antimicrobials:    Subjective: -Intermittent agitation, got multiple doses of  Objective: Vitals:   10/13/20 0000 10/13/20 0300 10/13/20 0500 10/13/20 0700  BP: (!) 93/49 117/85  126/89  Pulse: 94 96  95  Resp: (!) 23 19  20  Temp:  97.8 F (36.6 C)    TempSrc:  Axillary    SpO2: 96% 100%  100%  Weight:   63.6 kg   Height:        Intake/Output Summary (Last 24 hours) at 10/13/2020 1159 Last data filed at 10/13/2020 0554 Gross per 24 hour  Intake 600 ml  Output 4675 ml  Net -4075 ml   Filed Weights   10/06/20 1445 10/12/20 0500 10/13/20 0500  Weight: 68 kg 72 kg 63.6 kg    Examination:  General exam: Chronically ill young male, laying in bed, somnolent but arousable, answers a few questions, disoriented HEENT: NG tube with tube feeds infusing, left IJ central line noted CVS: S1-S2, regular rhythm Lungs: Poor air movement bilaterally, few scattered rhonchi Abdomen: Soft, nontender, bowel sounds present Extremities: No edema  Neuro: Moves all extremities, poor effort, no overt localizing signs noted  Psych: Unable to assess     Data Reviewed:   CBC: Recent Labs  Lab 10/09/20 0435 10/10/20 0326 10/11/20 0517 10/12/20 0430 10/13/20 0500  WBC 8.4 7.6 6.3 7.6 8.1  HGB 8.6* 8.2* 7.2* 7.3* 7.9*  HCT 25.1* 25.1* 21.9* 22.9* 24.5*  MCV 102.4* 105.9* 106.8* 108.0* 106.1*  PLT 53* 67* 82* 122* 92*   Basic Metabolic Panel: Recent Labs  Lab 10/08/20 2000 10/09/20 0435 10/10/20 0326 10/11/20 0517 10/12/20 0430 10/13/20 0500  NA  --  145  144 149* 148* 149*  K  --  3.8 3.0* 3.0* 3.1* 2.9*  CL  --  111 111 113* 115* 119*  CO2  --  29 26 21* 15* 21*  GLUCOSE  --  114* 189* 120* 160* 283*  BUN  --  6 9 11 16 14  CREATININE  --  0.56* 0.68 0.77 1.12 0.84  CALCIUM  --  7.6* 7.7* 7.7* 8.0* 8.5*  MG  --  1.9 1.7 1.9 1.8 2.1  PHOS 3.6 3.2  --  4.2 4.6 4.0   GFR: Estimated Creatinine Clearance: 101 mL/min (by C-G formula based on SCr of 0.84 mg/dL). Liver Function Tests: Recent Labs  Lab 10/09/20 0435 10/10/20 0326 10/11/20 0517 10/12/20 0430 10/13/20 0500  AST 158* 87* 67* 60* 30  ALT 100* 78* 57* 56* 46*  ALKPHOS 234* 234* 184* 201* 227*  BILITOT 1.3* 1.0 1.3* 2.9* 1.6*  PROT 4.8* 5.0* 4.9* 4.9* 5.2*  ALBUMIN 1.8* 1.7* 1.7* 1.6* 1.7*   No results for input(s): LIPASE, AMYLASE in the last 168 hours. Recent Labs  Lab 10/07/20 0401  AMMONIA 24   Coagulation Profile: Recent Labs  Lab 10/09/20 0435 10/10/20 0326 10/11/20 0517 10/12/20 0430 10/13/20 0500  INR 1.2 1.1 1.4* 1.2 1.1     Cardiac Enzymes: No results for input(s): CKTOTAL, CKMB, CKMBINDEX, TROPONINI in the last 168 hours. BNP (last 3 results) No results for input(s): PROBNP in the last 8760 hours. HbA1C: No results for input(s): HGBA1C in the last 72 hours. CBG: Recent Labs  Lab 10/12/20 2306 10/13/20 0356 10/13/20 0644 10/13/20 1037 10/13/20 1141  GLUCAP 102* 80 293* 367* 349*   Lipid Profile: Recent Labs    10/13/20 0500  CHOL 120  HDL 11*  LDLCALC 75  TRIG 170*  CHOLHDL 10.9   Thyroid Function Tests: No results for input(s): TSH, T4TOTAL, FREET4, T3FREE, THYROIDAB in the last 72 hours. Anemia Panel: Recent Labs    10/11/20 0500 10/11/20 1317  VITAMINB12  --  1,525*  FOLATE 16.6  --   FERRITIN 406*  --   TIBC NOT CALCULATED  --   IRON 11*  --   RETICCTPCT 2.1  --    Urine analysis:    Component Value Date/Time   COLORURINE YELLOW (A) 10/07/2020 2253   APPEARANCEUR CLEAR (A) 10/13/2020 2253   LABSPEC 1.017  09/30/2020 2253   PHURINE 6.0 10/20/2020 2253   GLUCOSEU >=500 (A) 10/01/2020 2253   HGBUR NEGATIVE 10/03/2020 2253   BILIRUBINUR NEGATIVE 10/20/2020 2253   BILIRUBINUR negative 06/12/2020 1541   BILIRUBINUR neg 02/29/2020 0918   KETONESUR 20 (A) 10/26/2020 2253   PROTEINUR NEGATIVE 10/10/2020 2253   UROBILINOGEN 0.2 06/12/2020 1541   UROBILINOGEN 1.0 12/11/2016 1020   NITRITE NEGATIVE 10/14/2020 2253   LEUKOCYTESUR NEGATIVE 10/21/2020 2253   Sepsis Labs: _0 (procalcitonin:4,lacticidven:4)  ) Recent Results (from the past 240 hour(s))  Resp Panel by RT-PCR (Flu A&B, Covid) Nasopharyngeal Swab     Status: None   Collection Time: 10/06/2020  9:13 PM   Specimen: Nasopharyngeal Swab; Nasopharyngeal(NP) swabs in vial transport medium  Result Value Ref Range Status   SARS Coronavirus 2 by RT PCR NEGATIVE NEGATIVE Final    Comment: (NOTE) SARS-CoV-2 target nucleic acids are NOT DETECTED.  The SARS-CoV-2 RNA is generally detectable in upper respiratory specimens during the acute phase of infection. The lowest concentration of SARS-CoV-2 viral copies this assay can detect is 138 copies/mL. A negative result does not preclude SARS-Cov-2 infection and should not be used as the sole basis for treatment or other patient management decisions. A negative result may occur with  improper specimen collection/handling, submission of specimen other than nasopharyngeal swab, presence of viral mutation(s) within the areas targeted by this assay, and inadequate number of viral copies(<138 copies/mL). A negative result must be combined with clinical observations, patient history, and epidemiological information. The expected result is Negative.  Fact Sheet for Patients:  EntrepreneurPulse.com.au  Fact Sheet for Healthcare Providers:  IncredibleEmployment.be  This test is no t yet approved or cleared by the [place] FDA and  has been authorized for  detection and/or diagnosis of SARS-CoV-2 by FDA under an Emergency Use Authorization (EUA). This EUA will remain  in effect (meaning this test can be used) for the duration of the COVID-19 declaration under Section 564(b)(1) of the Act, 21 U.S.C.section 360bbb-3(b)(1), unless the authorization is terminated  or revoked sooner.       Influenza A by PCR NEGATIVE NEGATIVE Final   Influenza B by PCR NEGATIVE NEGATIVE Final    Comment: (NOTE) The Xpert Xpress SARS-CoV-2/FLU/RSV plus assay is intended as an aid in the diagnosis of influenza from Nasopharyngeal swab specimens and should not be used as a sole basis for treatment. Nasal washings and aspirates are unacceptable for  Xpert Xpress SARS-CoV-2/FLU/RSV testing.  Fact Sheet for Patients: https://www.fda.gov/media/152166/download  Fact Sheet for Healthcare Providers: https://www.fda.gov/media/152162/download  This test is not yet approved or cleared by the [place] FDA and has been authorized for detection and/or diagnosis of SARS-CoV-2 by FDA under an Emergency Use Authorization (EUA). This EUA will remain in effect (meaning this test can be used) for the duration of the COVID-19 declaration under Section 564(b)(1) of the Act, 21 U.S.C. section 360bbb-3(b)(1), unless the authorization is terminated or revoked.  Performed at [facility], [address] [place], [place] [address]   Blood Culture (routine x 2)     Status: None   Collection Time: 10/09/2020  9:57 PM   Specimen: BLOOD  Result Value Ref Range Status   Specimen Description BLOOD  LEFT HAND  Final   Special Requests   Final    BOTTLES DRAWN AEROBIC AND ANAEROBIC Blood Culture adequate volume   Culture   Final    NO GROWTH 5 DAYS Performed at [facility], [address] [place], [place] [address]    Report Status 10/10/2020 FINAL  Final  Blood Culture (routine x 2)     Status: None   Collection Time: 10/06/2020  9:57 PM   Specimen:  BLOOD  Result Value Ref Range Status   Specimen Description BLOOD  LEFT HAND  Final   Special Requests   Final    BOTTLES DRAWN AEROBIC AND ANAEROBIC Blood Culture adequate volume   Culture   Final    NO GROWTH 5 DAYS Performed at [facility], [address] [place], [place] [address]    Report Status 10/10/2020 FINAL  Final  Urine culture     Status: Abnormal   Collection Time: 10/08/2020 10:53 PM   Specimen: In/Out Cath Urine  Result Value Ref Range Status   Specimen Description   Final    IN/OUT CATH URINE Performed at [facility], [address] [place], [place] [address]    Special Requests   Final    NONE Performed at  Hospital Lab, [address] [place], [place] [address]    Culture (A)  Final    4,000 COLONIES/mL STAPHYLOCOCCUS EPIDERMIDIS 100 COLONIES/mL ESCHERICHIA COLI    Report Status 10/08/2020 FINAL  Final   Organism ID, Bacteria STAPHYLOCOCCUS EPIDERMIDIS (A)  Final   Organism ID, Bacteria ESCHERICHIA COLI (A)  Final      Susceptibility   Escherichia coli - MIC*    AMPICILLIN <=2 SENSITIVE Sensitive     CEFAZOLIN <=4 SENSITIVE Sensitive     CEFEPIME <=0.12 SENSITIVE Sensitive     CEFTRIAXONE <=0.25 SENSITIVE Sensitive     CIPROFLOXACIN <=0.25 SENSITIVE Sensitive     GENTAMICIN <=1 SENSITIVE Sensitive     IMIPENEM <=0.25 SENSITIVE Sensitive     NITROFURANTOIN <=16 SENSITIVE Sensitive     TRIMETH/SULFA <=20 SENSITIVE Sensitive     AMPICILLIN/SULBACTAM <=2 SENSITIVE Sensitive     PIP/TAZO <=4 SENSITIVE Sensitive     * 100 COLONIES/mL ESCHERICHIA COLI   Staphylococcus epidermidis - MIC*    CIPROFLOXACIN <=0.5 SENSITIVE Sensitive     GENTAMICIN <=0.5 SENSITIVE Sensitive     NITROFURANTOIN <=16 SENSITIVE Sensitive     OXACILLIN >=4 RESISTANT Resistant     TETRACYCLINE <=1 SENSITIVE Sensitive     VANCOMYCIN 1 SENSITIVE Sensitive     TRIMETH/SULFA <=10 SENSITIVE Sensitive     CLINDAMYCIN 2 INTERMEDIATE Intermediate      RIFAMPIN <=0.5 SENSITIVE Sensitive       Inducible Clindamycin NEGATIVE Sensitive     * 4,000 COLONIES/mL STAPHYLOCOCCUS EPIDERMIDIS  MRSA PCR Screening     Status: Abnormal   Collection Time: 10/06/20  1:10 AM   Specimen: Nasal Mucosa; Nasopharyngeal  Result Value Ref Range Status   MRSA by PCR POSITIVE (A) NEGATIVE Final    Comment:        The GeneXpert MRSA Assay (FDA approved for NASAL specimens only), is one component of a comprehensive MRSA colonization surveillance program. It is not intended to diagnose MRSA infection nor to guide or monitor treatment for MRSA infections. NOTIFIED MARIA JOBLOMAN RN 0304 10/06/2020 JG Performed at [facility], [address] [place], [place] [address]   C Difficile Quick Screen w PCR reflex     Status: Abnormal   Collection Time: 10/06/20  1:30 AM   Specimen: STOOL  Result Value Ref Range Status   C Diff antigen POSITIVE (A) NEGATIVE Final   C Diff toxin NEGATIVE NEGATIVE Final   C Diff interpretation Results are indeterminate. See PCR results.  Final    Comment: Performed at [facility], [address] [place], [place] [address]  Gastrointestinal Panel by PCR , Stool     Status: None   Collection Time: 10/06/20  1:30 AM   Specimen: Nasal Mucosa; Stool  Result Value Ref Range Status   Campylobacter species NOT DETECTED NOT DETECTED Final   Plesimonas shigelloides NOT DETECTED NOT DETECTED Final   Salmonella species NOT DETECTED NOT DETECTED Final   Yersinia enterocolitica NOT DETECTED NOT DETECTED Final   Vibrio species NOT DETECTED NOT DETECTED Final   Vibrio cholerae NOT DETECTED NOT DETECTED Final   Enteroaggregative E coli (EAEC) NOT DETECTED NOT DETECTED Final   Enteropathogenic E coli (EPEC) NOT DETECTED NOT DETECTED Final   Enterotoxigenic E coli (ETEC) NOT DETECTED NOT DETECTED Final   Shiga like toxin producing E coli (STEC) NOT DETECTED NOT DETECTED Final   Shigella/Enteroinvasive E coli (EIEC) NOT  DETECTED NOT DETECTED Final   Cryptosporidium NOT DETECTED NOT DETECTED Final   Cyclospora cayetanensis NOT DETECTED NOT DETECTED Final   Entamoeba histolytica NOT DETECTED NOT DETECTED Final   Giardia lamblia NOT DETECTED NOT DETECTED Final   Adenovirus F40/41 NOT DETECTED NOT DETECTED Final   Astrovirus NOT DETECTED NOT DETECTED Final   Norovirus GI/GII NOT DETECTED NOT DETECTED Final   Rotavirus A NOT DETECTED NOT DETECTED Final   Sapovirus (I, II, IV, and V) NOT DETECTED NOT DETECTED Final    Comment: Performed at [facility], [address] [place], [place] [address]  C. Diff by PCR, Reflexed     Status: Abnormal   Collection Time: 10/06/20  1:30 AM  Result Value Ref Range Status   Toxigenic C. Difficile by PCR POSITIVE (A) NEGATIVE Final    Comment: Positive for toxigenic C. difficile with little to no toxin production. Only treat if clinical presentation suggests symptomatic illness. Performed at [facility], [address] [place], [place] [address]   Culture, Respiratory w Gram Stain     Status: None   Collection Time: 10/07/20  5:00 AM   Specimen: Tracheal Aspirate; Respiratory  Result Value Ref Range Status   Specimen Description   Final    TRACHEAL ASPIRATE Performed at [facility], [address] [place], [place] [address]    Special Requests   Final    NONE Performed at  Hospital Lab, [address] [place], [place] [address]    Gram   Stain   Final    FEW WBC PRESENT,BOTH PMN AND MONONUCLEAR RARE SQUAMOUS EPITHELIAL CELLS PRESENT NO ORGANISMS SEEN    Culture   Final    FEW Consistent with normal respiratory flora. No Pseudomonas species isolated Performed at [facility], [address] [place],  [address]    Report Status 10/09/2020 FINAL  Final         Radiology Studies: DG Abd 1 View  Result Date: 10/11/2020 CLINICAL DATA:  Nasogastric tube placement EXAM: ABDOMEN - 1 VIEW COMPARISON:  October 07, 2020 FINDINGS: Nasogastric tube tip and side port in stomach. No bowel dilatation or air-fluid level evident to suggest bowel obstruction. No free air seen on supine examination. Lung bases clear. IMPRESSION: Nasogastric tube tip and side port in stomach. No bowel obstruction or free air evident on supine examination. Electronically Signed   By: William  Woodruff III M.D.   On: 10/11/2020 15:35   MR ANGIO HEAD WO CONTRAST  Result Date: 10/12/2020 CLINICAL DATA:  Neuro deficit, acute, stroke suspected. Follow-up CVA. EXAM: MRA HEAD WITHOUT CONTRAST TECHNIQUE: Angiographic images of the Circle of Willis were obtained using MRA technique without intravenous contrast. COMPARISON:  Brain MRI 10/08/2020. FINDINGS: The examination is severely motion degraded at the level of the M2 and more distal MCA branches, and at the level of the anterior cerebral arteries, precluding evaluation of these vessels. The intracranial internal carotid arteries and M1 middle cerebral arteries are patent without significant stenosis. The vertebral arteries and basilar artery are patent without significant stenosis. Fenestration within the mid basilar artery. The posterior cerebral arteries are patent proximally without significant stenosis. No appreciable intracranial aneurysm. IMPRESSION: Severe motion degradation precludes evaluation of the M2 and more distal middle cerebral artery branches, and also precludes evaluation of the anterior cerebral arteries. The intracranial internal carotid arteries and M1 middle cerebral arteries are patent without significant stenosis. The vertebral arteries and basilar artery are patent without significant stenosis. The posterior cerebral arteries are patent proximally without significant stenosis. Electronically Signed   By: Kyle  Golden DO   On: 10/12/2020 13:20   Scheduled Meds: . chlorhexidine gluconate (MEDLINE KIT)  15 mL Mouth Rinse BID  . Chlorhexidine Gluconate Cloth  6 each Topical  Daily  . diphenhydrAMINE  25 mg Intravenous Once  . enoxaparin (LOVENOX) injection  1 mg/kg Subcutaneous Q12H  . famotidine  20 mg Per Tube BID  . [START ON 10/14/2020] folic acid  1 mg Per Tube Daily  . free water  350 mL Per Tube Q4H  . gabapentin  300 mg Per Tube TID  . insulin aspart  0-6 Units Subcutaneous Q4H  . insulin aspart  2 Units Subcutaneous Q4H  . multivitamin with minerals  1 tablet Per Tube Daily  . sodium chloride flush  10-40 mL Intracatheter Q12H  . thiamine  100 mg Per Tube Daily   Or  . thiamine  100 mg Intravenous Daily  . vancomycin  125 mg Per Tube QODAY   Continuous Infusions: . sodium chloride Stopped (10/08/20 1436)  . sodium chloride    . dextrose 5 % with kcl 75 mL/hr at 10/13/20 1145  . feeding supplement (GLUCERNA 1.5 CAL) 1,000 mL (10/11/20 1610)     LOS: 8 days    Time spent: 25min   , MD [facility]  10/13/2020, 11:59 AM  

## 2020-10-13 NOTE — Progress Notes (Signed)
PHARMACY CONSULT NOTE - FOLLOW UP  Pharmacy Consult for Electrolyte Monitoring and Replacement   Recent Labs: Potassium (mmol/L)  Date Value  10/13/2020 2.9 (L)   Magnesium (mg/dL)  Date Value  36/14/4315 2.1   Calcium (mg/dL)  Date Value  40/01/6760 8.3 (L)   Albumin (g/dL)  Date Value  95/02/3266 1.7 (L)  02/29/2020 4.7   Phosphorus (mg/dL)  Date Value  12/45/8099 4.0   Sodium (mmol/L)  Date Value  10/13/2020 147 (H)  02/29/2020 145 (H)     Assessment: Caucasian male with medical history significant for C. difficile diarrhea, type II diabetes mellitus, hypertension, hepatitis C, dyslipidemia and alcohol abuse, who presented to the emergency room with acute onset of hyperglycemia with altered mental status with mild confusion. Pharmacy has been consulted for electrolyte monitoring and replacement.   Free water 31mL q4h>251mL q4h Tube Feeds: Glucerna @60mL /hr  Goal of Therapy:  Electrolytes WNL   Plan:   K 2.9 - MD ordered dextrose 5 % 1,000 mL with potassium chloride 40 mEq infusion, will order PO KCl packet x 2 doses.  Mg 2.1, Phos 4.0 - no replacement at this time  Na 147 - continue free water to q4h  Monitor electrolytes daily and replace as needed   , PharmD Clinical Pharmacist 10/13/2020

## 2020-10-13 NOTE — Progress Notes (Signed)
PHARMACY CONSULT NOTE - FOLLOW UP  Pharmacy Consult for Electrolyte Monitoring and Replacement   Recent Labs: Potassium (mmol/L)  Date Value  10/13/2020 2.9 (L)   Magnesium (mg/dL)  Date Value  24/46/2863 2.1   Calcium (mg/dL)  Date Value  81/77/1165 8.5 (L)   Albumin (g/dL)  Date Value  79/08/8331 1.7 (L)  02/29/2020 4.7   Phosphorus (mg/dL)  Date Value  83/29/1916 4.0   Sodium (mmol/L)  Date Value  10/13/2020 149 (H)  02/29/2020 145 (H)     Assessment: Caucasian male with medical history significant for C. difficile diarrhea, type II diabetes mellitus, hypertension, hepatitis C, dyslipidemia and alcohol abuse, who presented to the emergency room with acute onset of hyperglycemia with altered mental status with mild confusion. Pharmacy has been consulted for electrolyte monitoring and replacement.   Free water 53mL q4h>268mL q4h Tube Feeds: Glucerna @60mL /hr  Goal of Therapy:  Electrolytes WNL   Plan:   K 2.9 -MD ordered PO KCl packet x 2 doses.  F/u K level at 1800  Mg 2.1, Phos 4.0 - no replacement at this time  Na 149 - continue free water to q4h  Monitor electrolytes daily and replace as needed   PharmD Clinical Pharmacist 10/13/2020

## 2020-10-14 DIAGNOSIS — J9601 Acute respiratory failure with hypoxia: Secondary | ICD-10-CM | POA: Diagnosis not present

## 2020-10-14 DIAGNOSIS — E0810 Diabetes mellitus due to underlying condition with ketoacidosis without coma: Secondary | ICD-10-CM | POA: Diagnosis not present

## 2020-10-14 DIAGNOSIS — B182 Chronic viral hepatitis C: Secondary | ICD-10-CM | POA: Diagnosis not present

## 2020-10-14 LAB — CBC
HCT: 25 % — ABNORMAL LOW (ref 39.0–52.0)
Hemoglobin: 8.1 g/dL — ABNORMAL LOW (ref 13.0–17.0)
MCH: 34.3 pg — ABNORMAL HIGH (ref 26.0–34.0)
MCHC: 32.4 g/dL (ref 30.0–36.0)
MCV: 105.9 fL — ABNORMAL HIGH (ref 80.0–100.0)
Platelets: 85 10*3/uL — ABNORMAL LOW (ref 150–400)
RBC: 2.36 MIL/uL — ABNORMAL LOW (ref 4.22–5.81)
RDW: 17.2 % — ABNORMAL HIGH (ref 11.5–15.5)
WBC: 9.1 10*3/uL (ref 4.0–10.5)
nRBC: 0.7 % — ABNORMAL HIGH (ref 0.0–0.2)

## 2020-10-14 LAB — BASIC METABOLIC PANEL
Anion gap: 2 — ABNORMAL LOW (ref 5–15)
BUN: 13 mg/dL (ref 6–20)
CO2: 21 mmol/L — ABNORMAL LOW (ref 22–32)
Calcium: 8.7 mg/dL — ABNORMAL LOW (ref 8.9–10.3)
Chloride: 123 mmol/L — ABNORMAL HIGH (ref 98–111)
Creatinine, Ser: 0.7 mg/dL (ref 0.61–1.24)
GFR, Estimated: >60 mL/min (ref >60)
Glucose, Bld: 127 mg/dL — ABNORMAL HIGH (ref 70–99)
Potassium: 3.8 mmol/L (ref 3.5–5.1)
Sodium: 146 mmol/L — ABNORMAL HIGH (ref 135–145)

## 2020-10-14 LAB — GLUCOSE, CAPILLARY
Glucose-Capillary: 115 mg/dL — ABNORMAL HIGH (ref 70–99)
Glucose-Capillary: 138 mg/dL — ABNORMAL HIGH (ref 70–99)
Glucose-Capillary: 149 mg/dL — ABNORMAL HIGH (ref 70–99)
Glucose-Capillary: 174 mg/dL — ABNORMAL HIGH (ref 70–99)
Glucose-Capillary: 201 mg/dL — ABNORMAL HIGH (ref 70–99)
Glucose-Capillary: 212 mg/dL — ABNORMAL HIGH (ref 70–99)
Glucose-Capillary: 75 mg/dL (ref 70–99)

## 2020-10-14 MED ORDER — POTASSIUM CHLORIDE 20 MEQ PO PACK: 40.0000 meq | PACK | Freq: Once | ORAL | Status: DC

## 2020-10-14 MED ORDER — POTASSIUM CHLORIDE 20 MEQ PO PACK
40.0000 meq | PACK | Freq: Once | ORAL | Status: AC
  Administered 2020-10-14: 40 meq
  Filled 2020-10-14: qty 2

## 2020-10-14 MED ORDER — SODIUM CHLORIDE 0.9 % IV BOLUS
250.0000 mL | Freq: Once | INTRAVENOUS | Status: AC
  Administered 2020-10-14: 250 mL via INTRAVENOUS

## 2020-10-14 MED ORDER — LOPERAMIDE HCL 2 MG PO CAPS
2.0000 mg | ORAL_CAPSULE | Freq: Two times a day (BID) | ORAL | Status: DC
  Administered 2020-10-14 – 2020-10-16 (×4): 2 mg via ORAL
  Filled 2020-10-14 (×4): qty 1

## 2020-10-14 MED ORDER — KCL-LACTATED RINGERS-D5W 20 MEQ/L IV SOLN
INTRAVENOUS | Status: DC
  Filled 2020-10-14 (×4): qty 1000

## 2020-10-14 MED ORDER — QUETIAPINE FUMARATE 25 MG PO TABS
50.0000 mg | ORAL_TABLET | Freq: Every day | ORAL | Status: DC
  Administered 2020-10-14 – 2020-10-16 (×3): 50 mg
  Filled 2020-10-14 (×3): qty 2

## 2020-10-14 MED ORDER — LOPERAMIDE HCL 1 MG/7.5ML PO SUSP
1.0000 mg | Freq: Two times a day (BID) | ORAL | Status: DC
  Filled 2020-10-14 (×4): qty 7.5

## 2020-10-14 MED ORDER — INSULIN GLARGINE 100 UNIT/ML ~~LOC~~ SOLN
5.0000 [IU] | Freq: Every day | SUBCUTANEOUS | Status: DC
  Administered 2020-10-14 – 2020-10-17 (×4): 5 [IU] via SUBCUTANEOUS
  Filled 2020-10-14 (×5): qty 0.05

## 2020-10-14 MED ORDER — SODIUM CHLORIDE 0.9 % IV BOLUS
500.0000 mL | Freq: Once | INTRAVENOUS | Status: AC
  Administered 2020-10-14: 500 mL via INTRAVENOUS

## 2020-10-14 MED ORDER — QUETIAPINE FUMARATE 25 MG PO TABS
25.0000 mg | ORAL_TABLET | Freq: Every day | ORAL | Status: DC
  Administered 2020-10-15: 25 mg
  Filled 2020-10-14: qty 1

## 2020-10-14 NOTE — Progress Notes (Signed)
PROGRESS NOTE    Tony Long  VCB:[id number] DOB: 1975-08-21 DOA: 10/04/2020 PCP: Azzie Glatter, FNP  Brief Narrative: 45 year old male chronically ill secondary to uncontrolled diabetes mellitus and ongoing alcohol abuse, was admitted to [facility] on 4/9 with DKA and hypokalemia, suffered a PEA arrest on early 4/10 secondary to severe hypokalemia, K<2 and severe hypoglycemia (CBG 21) achieved ROSC in 13 minutes, subsequently intubated and admitted to the ICU, post arrest had acute hypoxic respiratory failure with concern for aspiration pneumonitis, also has anoxic encephalopathy as well as multiple ischemic infarcts on MRI brain.  In the ICU also had CTA chest which showed mild very minor distal bilateral PE. -All this in the background of chronic hep C, alcohol and opiate abuse and uncontrolled diabetes mellitus -Extubated 4/13 and transferred to [name] service 4/14 -Remains obtunded, NG tube placed and tube feeds started   Assessment & Plan:   S/p PEA arrest -Suspected to be secondary to severe hypoglycemia and hypokalemia -Estimated 13 minutes downtime, CPR initiated immediately- found in PEA, received 0 defibrillations, suspected anoxic injury. CBG 21 Patient deemed not a candidate for TTM due to unclear downtime  -Was briefly on pressors, then weaned off - Echocardiogram 4/10-LVEF 38-46%, LV diastolic parameters normal, RV systolic function mildly reduced, no significant valve disease -Monitor electrolytes closely, monitor on telemetry  Acute Hypoxic Respiratory Failure in the setting of PEA arrest, Aspiration Pneumonitis, & Minor Peripheral Bilateral LL Pulmonary Emboli -Intubated on 4/10 and extubated 4/13 -Imaging also concerning for suspected aspiration pneumonitis, currently on IV Unasyn day 7, discontinue antibiotics after today's dose and monitor, unfortunately continues to be at high risk for recurrent aspiration -Bronchodilators as needed -On full dose Lovenox,  -Transition  to [place] after PEG tube  Dysphagia -Secondary to anoxic encephalopathy, CVA etc. -SLP following, failed swallow eval 4/14, started tube feeds, anticipate he will need PEG tube, await repeat SLP eval early next week  Anoxic encephalopathy Acute Ischemic CVA Alcohol abuse, risk of withdrawal, drinks 1/5 of liquor daily -Off Precedex drip 4/14 -Day 8 of hospitalization do not suspect alcohol withdrawal anymore -Changed to Ativan every 6 h PRN, started low-dose Seroquel qpm and in am -continue gabapentin  -Continue high-dose thiamine -Continue therapy SLP, PT OT eval -Anticipate need for PEG tube, await SLP eval early next week  Acute/subacute infarcts - MRI Brain 4/11 with numerous small acute/early subacute infarcts within bilateral cerebral hemispheres (embolic vs. Watershed ischemia) -Likely following PEA arrest -Neurology following, appreciate input, MRA ordered -Continue full dose Lovenox  DKA  -On admission, resolved  Severe hypoglycemia Type 2 diabetes mellitus -Continue sliding scale insulin, restarted tube feeds,  -Brittle diabetes with hyper and hypoglycemia, hypoglycemic overnight will stop NovoLog every 4 and decrease Lantus dose to 5 -Hemoglobin A1c is 11.1  History of recurrent C Diff  -C Diff Ag positive, toxin negative, PCR positive  -Followed by ID, Dr. Baxter Flattery. He was initially diagnosed in November, has completed vanco x 2, dificid, then most recently vanco taper (started 3/16, should be on 1 capsule every other day for 4 weeks now, last day treatment 5/3). Referred to [facility] for fecal transplant (appt at [facility] 5/23), considered for zinplava infusion.  -Continue vanco taper  -Worsening diarrhea ever since tube feeds resumed at goal, add Imodium twice daily, do not suspect active C. difficile colitis at this time  Pancytopenia Chronic macrocytic anemia -Likely secondary to alcoholism, anemia panel suggestive of chronic disease and iron deficiency -Given IV iron  4/15, hemoglobin up to 8.1,  continue to monitor  Transaminitis Alcoholic liver disease and hep C -Continue to trend   DVT prophylaxis: Full dose Lovenox Code Status: Full code Family Communication: Discussed with girlfriend 2 days prior, attempted to call patient's mother yesterday, will try again Disposition Plan:  Status is: Inpatient  Remains inpatient appropriate because:Inpatient level of care appropriate due to severity of illness   Dispo: The patient is from: Home              Anticipated d/c is to: SNF              Patient currently is not medically stable to d/c.   Difficult to place patient No   Consultants:   PCCM Tx, NEuro   Procedures:   Antimicrobials:    Subjective: -Continues to have profuse diarrhea, tube feeds running at goal, CBGs low this morning  Objective: Vitals:   10/14/20 0745 10/14/20 0900 10/14/20 1000 10/14/20 1100  BP:  (!) '83/64 91/66 94/71 '  Pulse: 96 89 87 85  Resp: '13 20 18   ' Temp: (!) 96.2 F (35.7 C)     TempSrc: Axillary     SpO2: 100% 96% 99%   Weight:      Height:        Intake/Output Summary (Last 24 hours) at 10/14/2020 1112 Last data filed at 10/14/2020 1101 Gross per 24 hour  Intake 2429.45 ml  Output 5860 ml  Net -3430.55 ml   Filed Weights   10/12/20 0500 10/13/20 0500 10/14/20 0415  Weight: 72 kg 63.6 kg 62 kg    Examination:  General exam: Chronically ill young male, laying in bed, somnolent but arousable, mumbles, disoriented HEENT: NG tube with tube feeds infusing, left IJ central line noted CVS: S1-S2, regular rate rhythm Lungs: Poor air movement bilaterally, few scattered rhonchi at the bases Abdomen: Soft, nontender, bowel sounds present GU: Flexi-Seal and condom catheter noted Extremities: No edema Neuro: Moves all extremities, poor effort, no overt localizing signs noted  Psych: Unable to assess     Data Reviewed:   CBC: Recent Labs  Lab 10/10/20 0326 10/11/20 0517 10/12/20 0430  10/13/20 0500 10/14/20 0353  WBC 7.6 6.3 7.6 8.1 9.1  HGB 8.2* 7.2* 7.3* 7.9* 8.1*  HCT 25.1* 21.9* 22.9* 24.5* 25.0*  MCV 105.9* 106.8* 108.0* 106.1* 105.9*  PLT 67* 82* 122* 92* 85*   Basic Metabolic Panel: Recent Labs  Lab 10/08/20 2000 10/09/20 0435 10/10/20 0326 10/11/20 0517 10/12/20 0430 10/13/20 0500 10/13/20 1804 10/14/20 0353  NA  --  145 144 149* 148* 149* 147* 146*  K  --  3.8 3.0* 3.0* 3.1* 2.9* 2.9* 3.8  CL  --  111 111 113* 115* 119* 120* 123*  CO2  --  29 26 21* 15* 21* 21* 21*  GLUCOSE  --  114* 189* 120* 160* 283* 194* 127*  BUN  --  '6 9 11 16 14 14 13  ' CREATININE  --  0.56* 0.68 0.77 1.12 0.84 0.72 0.70  CALCIUM  --  7.6* 7.7* 7.7* 8.0* 8.5* 8.3* 8.7*  MG  --  1.9 1.7 1.9 1.8 2.1  --   --   PHOS 3.6 3.2  --  4.2 4.6 4.0  --   --    GFR: Estimated Creatinine Clearance: 103.3 mL/min (by C-G formula based on SCr of 0.7 mg/dL). Liver Function Tests: Recent Labs  Lab 10/09/20 0435 10/10/20 0326 10/11/20 0517 10/12/20 0430 10/13/20 0500  AST 158* 87* 67* 60* 30  ALT  100* 78* 57* 56* 46*  ALKPHOS 234* 234* 184* 201* 227*  BILITOT 1.3* 1.0 1.3* 2.9* 1.6*  PROT 4.8* 5.0* 4.9* 4.9* 5.2*  ALBUMIN 1.8* 1.7* 1.7* 1.6* 1.7*   No results for input(s): LIPASE, AMYLASE in the last 168 hours. No results for input(s): AMMONIA in the last 168 hours. Coagulation Profile: Recent Labs  Lab 10/09/20 0435 10/10/20 0326 10/11/20 0517 10/12/20 0430 10/13/20 0500  INR 1.2 1.1 1.4* 1.2 1.1   Cardiac Enzymes: No results for input(s): CKTOTAL, CKMB, CKMBINDEX, TROPONINI in the last 168 hours. BNP (last 3 results) No results for input(s): PROBNP in the last 8760 hours. HbA1C: No results for input(s): HGBA1C in the last 72 hours. CBG: Recent Labs  Lab 10/13/20 2307 10/13/20 2334 10/14/20 0038 10/14/20 0404 10/14/20 0745  GLUCAP 67* 167* 138* 115* 75   Lipid Profile: Recent Labs    10/13/20 0500  CHOL 120  HDL 11*  LDLCALC 75  TRIG 170*  CHOLHDL  10.9   Thyroid Function Tests: No results for input(s): TSH, T4TOTAL, FREET4, T3FREE, THYROIDAB in the last 72 hours. Anemia Panel: Recent Labs    10/11/20 1317  VITAMINB12 1,525*   Urine analysis:    Component Value Date/Time   COLORURINE YELLOW (A) 10/20/2020 2253   APPEARANCEUR CLEAR (A) 09/29/2020 2253   LABSPEC 1.017 10/19/2020 2253   PHURINE 6.0 10/23/2020 2253   GLUCOSEU >=500 (A) 09/29/2020 2253   HGBUR NEGATIVE 10/10/2020 2253   BILIRUBINUR NEGATIVE 09/28/2020 2253   BILIRUBINUR negative 06/12/2020 1541   BILIRUBINUR neg 02/29/2020 0918   KETONESUR 20 (A) 10/07/2020 2253   PROTEINUR NEGATIVE 10/03/2020 2253   UROBILINOGEN 0.2 06/12/2020 1541   UROBILINOGEN 1.0 12/11/2016 1020   NITRITE NEGATIVE 10/27/2020 2253   LEUKOCYTESUR NEGATIVE 10/13/2020 2253   Sepsis Labs: '@LABRCNTIP' (procalcitonin:4,lacticidven:4)  ) Recent Results (from the past 240 hour(s))  Resp Panel by RT-PCR (Flu A&B, Covid) Nasopharyngeal Swab     Status: None   Collection Time: 10/20/2020  9:13 PM   Specimen: Nasopharyngeal Swab; Nasopharyngeal(NP) swabs in vial transport medium  Result Value Ref Range Status   SARS Coronavirus 2 by RT PCR NEGATIVE NEGATIVE Final    Comment: (NOTE) SARS-CoV-2 target nucleic acids are NOT DETECTED.  The SARS-CoV-2 RNA is generally detectable in upper respiratory specimens during the acute phase of infection. The lowest concentration of SARS-CoV-2 viral copies this assay can detect is 138 copies/mL. A negative result does not preclude SARS-Cov-2 infection and should not be used as the sole basis for treatment or other patient management decisions. A negative result may occur with  improper specimen collection/handling, submission of specimen other than nasopharyngeal swab, presence of viral mutation(s) within the areas targeted by this assay, and inadequate number of viral copies(<138 copies/mL). A negative result must be combined with clinical observations,  patient history, and epidemiological information. The expected result is Negative.  Fact Sheet for Patients:  EntrepreneurPulse.com.au  Fact Sheet for Healthcare Providers:  IncredibleEmployment.be  This test is no t yet approved or cleared by the [place] FDA and  has been authorized for detection and/or diagnosis of SARS-CoV-2 by FDA under an Emergency Use Authorization (EUA). This EUA will remain  in effect (meaning this test can be used) for the duration of the COVID-19 declaration under Section 564(b)(1) of the Act, 21 U.S.C.section 360bbb-3(b)(1), unless the authorization is terminated  or revoked sooner.       Influenza A by PCR NEGATIVE NEGATIVE Final   Influenza B by PCR NEGATIVE  NEGATIVE Final    Comment: (NOTE) The Xpert Xpress SARS-CoV-2/FLU/RSV plus assay is intended as an aid in the diagnosis of influenza from Nasopharyngeal swab specimens and should not be used as a sole basis for treatment. Nasal washings and aspirates are unacceptable for Xpert Xpress SARS-CoV-2/FLU/RSV testing.  Fact Sheet for Patients: EntrepreneurPulse.com.au  Fact Sheet for Healthcare Providers: IncredibleEmployment.be  This test is not yet approved or cleared by the [place] FDA and has been authorized for detection and/or diagnosis of SARS-CoV-2 by FDA under an Emergency Use Authorization (EUA). This EUA will remain in effect (meaning this test can be used) for the duration of the COVID-19 declaration under Section 564(b)(1) of the Act, 21 U.S.C. section 360bbb-3(b)(1), unless the authorization is terminated or revoked.  Performed at [facility], [address] [place], [place] [address]   Blood Culture (routine x 2)     Status: None   Collection Time: 10/26/2020  9:57 PM   Specimen: BLOOD  Result Value Ref Range Status   Specimen Description BLOOD  LEFT HAND  Final   Special Requests    Final    BOTTLES DRAWN AEROBIC AND ANAEROBIC Blood Culture adequate volume   Culture   Final    NO GROWTH 5 DAYS Performed at [name], [address] [place], [place] [address]    Report Status 10/10/2020 FINAL  Final  Blood Culture (routine x 2)     Status: None   Collection Time: 10/16/2020  9:57 PM   Specimen: BLOOD  Result Value Ref Range Status   Specimen Description BLOOD  LEFT HAND  Final   Special Requests   Final    BOTTLES DRAWN AEROBIC AND ANAEROBIC Blood Culture adequate volume   Culture   Final    NO GROWTH 5 DAYS Performed at [facility], [address] [place], [place] [address]    Report Status 10/10/2020 FINAL  Final  Urine culture     Status: Abnormal   Collection Time: 09/30/2020 10:53 PM   Specimen: In/Out Cath Urine  Result Value Ref Range Status   Specimen Description   Final    IN/OUT CATH URINE Performed at [facility], [address] [place], [place] [address]    Special Requests   Final    NONE Performed at [facility], [address] [place], [place] [address]    Culture (A)  Final    4,000 COLONIES/mL STAPHYLOCOCCUS EPIDERMIDIS 100 COLONIES/mL ESCHERICHIA COLI    Report Status 10/08/2020 FINAL  Final   Organism ID, Bacteria STAPHYLOCOCCUS EPIDERMIDIS (A)  Final   Organism ID, Bacteria ESCHERICHIA COLI (A)  Final      Susceptibility   Escherichia coli - MIC*    AMPICILLIN <=2 SENSITIVE Sensitive     CEFAZOLIN <=4 SENSITIVE Sensitive     CEFEPIME <=0.12 SENSITIVE Sensitive     CEFTRIAXONE <=0.25 SENSITIVE Sensitive     CIPROFLOXACIN <=0.25 SENSITIVE Sensitive     GENTAMICIN <=1 SENSITIVE Sensitive     IMIPENEM <=0.25 SENSITIVE Sensitive     NITROFURANTOIN <=16 SENSITIVE Sensitive     TRIMETH/SULFA <=20 SENSITIVE Sensitive     AMPICILLIN/SULBACTAM <=2 SENSITIVE Sensitive     PIP/TAZO <=4 SENSITIVE Sensitive     * 100 COLONIES/mL ESCHERICHIA COLI   Staphylococcus epidermidis - MIC*     CIPROFLOXACIN <=0.5 SENSITIVE Sensitive     GENTAMICIN <=0.5 SENSITIVE Sensitive     NITROFURANTOIN <=16 SENSITIVE Sensitive     OXACILLIN >=4 RESISTANT  Resistant     TETRACYCLINE <=1 SENSITIVE Sensitive     VANCOMYCIN 1 SENSITIVE Sensitive     TRIMETH/SULFA <=10 SENSITIVE Sensitive     CLINDAMYCIN 2 INTERMEDIATE Intermediate     RIFAMPIN <=0.5 SENSITIVE Sensitive     Inducible Clindamycin NEGATIVE Sensitive     * 4,000 COLONIES/mL STAPHYLOCOCCUS EPIDERMIDIS  MRSA PCR Screening     Status: Abnormal   Collection Time: 10/06/20  1:10 AM   Specimen: Nasal Mucosa; Nasopharyngeal  Result Value Ref Range Status   MRSA by PCR POSITIVE (A) NEGATIVE Final    Comment:        The GeneXpert MRSA Assay (FDA approved for NASAL specimens only), is one component of a comprehensive MRSA colonization surveillance program. It is not intended to diagnose MRSA infection nor to guide or monitor treatment for MRSA infections. Oren Beckmann RN [phone] 10/06/2020 JG Performed at [facility], [place], [place] [address]   C Difficile Quick Screen w PCR reflex     Status: Abnormal   Collection Time: 10/06/20  1:30 AM   Specimen: STOOL  Result Value Ref Range Status   C Diff antigen POSITIVE (A) NEGATIVE Final   C Diff toxin NEGATIVE NEGATIVE Final   C Diff interpretation Results are indeterminate. See PCR results.  Final    Comment: Performed at [name], [address] [place], [place] [address]  Gastrointestinal Panel by PCR , Stool     Status: None   Collection Time: 10/06/20  1:30 AM   Specimen: Nasal Mucosa; Stool  Result Value Ref Range Status   Campylobacter species NOT DETECTED NOT DETECTED Final   Plesimonas shigelloides NOT DETECTED NOT DETECTED Final   Salmonella species NOT DETECTED NOT DETECTED Final   Yersinia enterocolitica NOT DETECTED NOT DETECTED Final   Vibrio species NOT DETECTED NOT DETECTED Final   Vibrio cholerae NOT DETECTED  NOT DETECTED Final   Enteroaggregative E coli (EAEC) NOT DETECTED NOT DETECTED Final   Enteropathogenic E coli (EPEC) NOT DETECTED NOT DETECTED Final   Enterotoxigenic E coli (ETEC) NOT DETECTED NOT DETECTED Final   Shiga like toxin producing E coli (STEC) NOT DETECTED NOT DETECTED Final   Shigella/Enteroinvasive E coli (EIEC) NOT DETECTED NOT DETECTED Final   Cryptosporidium NOT DETECTED NOT DETECTED Final   Cyclospora cayetanensis NOT DETECTED NOT DETECTED Final   Entamoeba histolytica NOT DETECTED NOT DETECTED Final   Giardia lamblia NOT DETECTED NOT DETECTED Final   Adenovirus F40/41 NOT DETECTED NOT DETECTED Final   Astrovirus NOT DETECTED NOT DETECTED Final   Norovirus GI/GII NOT DETECTED NOT DETECTED Final   Rotavirus A NOT DETECTED NOT DETECTED Final   Sapovirus (I, II, IV, and V) NOT DETECTED NOT DETECTED Final    Comment: Performed at [facility], [address] [place], [place] [address]  C. Diff by PCR, Reflexed     Status: Abnormal   Collection Time: 10/06/20  1:30 AM  Result Value Ref Range Status   Toxigenic C. Difficile by PCR POSITIVE (A) NEGATIVE Final    Comment: Positive for toxigenic C. difficile with little to no toxin production. Only treat if clinical presentation suggests symptomatic illness. Performed at [name], [address] [place], [place] [address]   Culture, Respiratory w Gram Stain     Status: None   Collection Time: 10/07/20  5:00 AM   Specimen: Tracheal Aspirate; Respiratory  Result Value Ref Range Status   Specimen Description   Final  TRACHEAL ASPIRATE Performed at [facility], [address] [place], [place] [address]    Special Requests   Final    NONE Performed at [facility], [place], [place] [address]    Gram Stain   Final    FEW WBC PRESENT,BOTH PMN AND MONONUCLEAR RARE SQUAMOUS EPITHELIAL CELLS PRESENT NO ORGANISMS SEEN    Culture   Final    FEW Consistent with  normal respiratory flora. No Pseudomonas species isolated Performed at [facility] [address], [place], [place] [address]    Report Status 10/09/2020 FINAL  Final    Radiology Studies: MR ANGIO HEAD WO CONTRAST  Result Date: 10/12/2020 CLINICAL DATA:  Neuro deficit, acute, stroke suspected. Follow-up CVA. EXAM: MRA HEAD WITHOUT CONTRAST TECHNIQUE: Angiographic images of the Circle of Willis were obtained using MRA technique without intravenous contrast. COMPARISON:  Brain MRI 10/08/2020. FINDINGS: The examination is severely motion degraded at the level of the M2 and more distal MCA branches, and at the level of the anterior cerebral arteries, precluding evaluation of these vessels. The intracranial internal carotid arteries and M1 middle cerebral arteries are patent without significant stenosis. The vertebral arteries and basilar artery are patent without significant stenosis. Fenestration within the mid basilar artery. The posterior cerebral arteries are patent proximally without significant stenosis. No appreciable intracranial aneurysm. IMPRESSION: Severe motion degradation precludes evaluation of the M2 and more distal middle cerebral artery branches, and also precludes evaluation of the anterior cerebral arteries. The intracranial internal carotid arteries and M1 middle cerebral arteries are patent without significant stenosis. The vertebral arteries and basilar artery are patent without significant stenosis. The posterior cerebral arteries are patent proximally without significant stenosis. Electronically Signed   By: Kellie Simmering DO   On: 10/12/2020 13:20   DG Chest Port 1 View  Result Date: 10/13/2020 CLINICAL DATA:  Hypoxia. EXAM: PORTABLE CHEST 1 VIEW COMPARISON:  10/09/2020 chest radiograph and CT. FINDINGS: Endotracheal tube has been removed. Nasogastric tube is coiled in the left upper abdomen near the gastric fundus. Right jugular central venous catheter in the SVC region.  Hazy and patchy densities at the left lung base are similar to the previous examination. Volume loss in the right hemithorax. Heart size is stable. Trachea is midline. IMPRESSION: 1. Low lung volumes with persistent basilar chest densities. Findings could be related to volume loss and/or consolidation. Findings have minimally changed since the chest radiograph on 10/09/2020. 2. Support apparatuses as described. Electronically Signed   By: Markus Daft M.D.   On: 10/13/2020 14:47   Scheduled Meds: . chlorhexidine gluconate (MEDLINE KIT)  15 mL Mouth Rinse BID  . Chlorhexidine Gluconate Cloth  6 each Topical Daily  . enoxaparin (LOVENOX) injection  1 mg/kg Subcutaneous Q12H  . famotidine  20 mg Per Tube BID  . folic acid  1 mg Per Tube Daily  . free water  350 mL Per Tube Q4H  . gabapentin  300 mg Per Tube TID  . insulin aspart  0-6 Units Subcutaneous Q4H  . insulin glargine  5 Units Subcutaneous Daily  . loperamide HCl  1 mg Per Tube BID  . multivitamin with minerals  1 tablet Per Tube Daily  . [START ON 10/15/2020] QUEtiapine  25 mg Per Tube Q breakfast  . QUEtiapine  50 mg Per Tube QHS  . sodium chloride flush  10-40 mL Intracatheter Q12H  . thiamine  100 mg Per Tube Daily   Or  . thiamine  100 mg Intravenous Daily  . vancomycin  125 mg Per Tube QODAY   Continuous Infusions: . sodium chloride Stopped (10/08/20 1436)  . sodium chloride    . feeding supplement (GLUCERNA 1.5 CAL) 1,000 mL (10/11/20 1610)     LOS: 9 days    Time spent: 33mn  PDomenic Polite MD [facility]  10/14/2020, 11:12 AM

## 2020-10-14 NOTE — Progress Notes (Signed)
PHARMACY CONSULT NOTE - FOLLOW UP  Pharmacy Consult for Electrolyte Monitoring and Replacement   Recent Labs: Potassium (mmol/L)  Date Value  10/14/2020 3.8   Magnesium (mg/dL)  Date Value  73/22/0254 2.1   Calcium (mg/dL)  Date Value  27/11/2374 8.7 (L)   Albumin (g/dL)  Date Value  28/31/5176 1.7 (L)  02/29/2020 4.7   Phosphorus (mg/dL)  Date Value  16/12/3708 4.0   Sodium (mmol/L)  Date Value  10/14/2020 146 (H)  02/29/2020 145 (H)     Assessment: Caucasian male with medical history significant for C. difficile diarrhea, type II diabetes mellitus, hypertension, hepatitis C, dyslipidemia and alcohol abuse, who presented to the emergency room with acute onset of hyperglycemia with altered mental status with mild confusion. Pharmacy has been consulted for electrolyte monitoring and replacement.   Free water 67mL q4h>260mL q4h Tube Feeds: Glucerna @60mL /hr  Goal of Therapy:  Electrolytes WNL   Plan:   K 3.8 - Will order KCL 40 meq packet x 1 for goal K 4.0 or >. Pt still w/ diarrhea  Na 146 - continue free water to q4h  Monitor electrolytes daily and replace as needed   , PharmD Clinical Pharmacist 10/14/2020

## 2020-10-15 DIAGNOSIS — J9601 Acute respiratory failure with hypoxia: Secondary | ICD-10-CM | POA: Diagnosis not present

## 2020-10-15 DIAGNOSIS — E0810 Diabetes mellitus due to underlying condition with ketoacidosis without coma: Secondary | ICD-10-CM | POA: Diagnosis not present

## 2020-10-15 DIAGNOSIS — B182 Chronic viral hepatitis C: Secondary | ICD-10-CM | POA: Diagnosis not present

## 2020-10-15 LAB — MAGNESIUM: Magnesium: 2 mg/dL (ref 1.7–2.4)

## 2020-10-15 LAB — COMPREHENSIVE METABOLIC PANEL
ALT: 41 U/L (ref 0–44)
AST: 33 U/L (ref 15–41)
Albumin: 1.9 g/dL — ABNORMAL LOW (ref 3.5–5.0)
Alkaline Phosphatase: 280 U/L — ABNORMAL HIGH (ref 38–126)
Anion gap: 5 (ref 5–15)
BUN: 18 mg/dL (ref 6–20)
CO2: 16 mmol/L — ABNORMAL LOW (ref 22–32)
Calcium: 9 mg/dL (ref 8.9–10.3)
Chloride: 116 mmol/L — ABNORMAL HIGH (ref 98–111)
Creatinine, Ser: 0.81 mg/dL (ref 0.61–1.24)
GFR, Estimated: >60 mL/min (ref >60)
Glucose, Bld: 321 mg/dL — ABNORMAL HIGH (ref 70–99)
Potassium: 4.9 mmol/L (ref 3.5–5.1)
Sodium: 137 mmol/L (ref 135–145)
Total Bilirubin: 1 mg/dL (ref 0.3–1.2)
Total Protein: 6 g/dL — ABNORMAL LOW (ref 6.5–8.1)

## 2020-10-15 LAB — GLUCOSE, CAPILLARY
Glucose-Capillary: 280 mg/dL — ABNORMAL HIGH (ref 70–99)
Glucose-Capillary: 232 mg/dL — ABNORMAL HIGH (ref 70–99)
Glucose-Capillary: 322 mg/dL — ABNORMAL HIGH (ref 70–99)
Glucose-Capillary: 141 mg/dL — ABNORMAL HIGH (ref 70–99)
Glucose-Capillary: 104 mg/dL — ABNORMAL HIGH (ref 70–99)
Glucose-Capillary: 79 mg/dL (ref 70–99)

## 2020-10-15 LAB — PHOSPHORUS: Phosphorus: 3.3 mg/dL (ref 2.5–4.6)

## 2020-10-15 LAB — CBC
HCT: 29.1 % — ABNORMAL LOW (ref 39.0–52.0)
Hemoglobin: 9.6 g/dL — ABNORMAL LOW (ref 13.0–17.0)
MCH: 34.5 pg — ABNORMAL HIGH (ref 26.0–34.0)
MCHC: 33 g/dL (ref 30.0–36.0)
MCV: 104.7 fL — ABNORMAL HIGH (ref 80.0–100.0)
Platelets: 103 10*3/uL — ABNORMAL LOW (ref 150–400)
RBC: 2.78 MIL/uL — ABNORMAL LOW (ref 4.22–5.81)
RDW: 16.7 % — ABNORMAL HIGH (ref 11.5–15.5)
WBC: 12.4 10*3/uL — ABNORMAL HIGH (ref 4.0–10.5)
nRBC: 0.5 % — ABNORMAL HIGH (ref 0.0–0.2)

## 2020-10-15 MED ORDER — GABAPENTIN 250 MG/5ML PO SOLN
300.0000 mg | Freq: Two times a day (BID) | ORAL | Status: DC
  Administered 2020-10-16 – 2020-10-17 (×4): 300 mg
  Filled 2020-10-15 (×6): qty 6

## 2020-10-15 MED ORDER — SODIUM CHLORIDE 0.9 % IV SOLN: INTRAVENOUS | Status: DC

## 2020-10-15 MED ORDER — NUTRISOURCE FIBER PO PACK
1.0000 | PACK | Freq: Two times a day (BID) | ORAL | Status: DC
  Administered 2020-10-15 – 2020-10-16 (×2): 1
  Filled 2020-10-15 (×4): qty 1

## 2020-10-15 MED ORDER — SACCHAROMYCES BOULARDII 250 MG PO CAPS
250.0000 mg | ORAL_CAPSULE | Freq: Two times a day (BID) | ORAL | Status: DC
  Administered 2020-10-15 – 2020-10-17 (×5): 250 mg
  Filled 2020-10-15 (×7): qty 1

## 2020-10-15 MED ORDER — MIDODRINE HCL 5 MG PO TABS
5.0000 mg | ORAL_TABLET | Freq: Three times a day (TID) | ORAL | Status: DC
  Administered 2020-10-15 – 2020-10-17 (×5): 5 mg
  Filled 2020-10-15 (×5): qty 1

## 2020-10-15 MED ORDER — INSULIN ASPART 100 UNIT/ML ~~LOC~~ SOLN
2.0000 [IU] | SUBCUTANEOUS | Status: DC
  Administered 2020-10-15 – 2020-10-17 (×10): 2 [IU] via SUBCUTANEOUS
  Filled 2020-10-15 (×10): qty 1

## 2020-10-15 NOTE — Evaluation (Signed)
Clinical/Bedside Swallow Evaluation Patient Details  Name: Tony Long MRN: [id number] Date of Birth: 1976/04/06  Today's Date: 10/15/2020 Time: SLP Start Time (ACUTE ONLY): 1020 SLP Stop Time (ACUTE ONLY): 1035 SLP Time Calculation (min) (ACUTE ONLY): 15 min  Past Medical History:  Past Medical History:  Diagnosis Date  . Alcohol use 02/2020  . Clostridioides difficile diarrhea 04/2020  . Diabetes mellitus without complication (HCC)   . Elevated liver enzymes 02/2020  . Elevated liver enzymes 02/2020  . Hyperlipidemia 02/2020  . Hypertension   . IV drug user   . Vitamin D deficiency 02/2020  . Wound healing, delayed    Past Surgical History:  Past Surgical History:  Procedure Laterality Date  . INCISION AND DRAINAGE ABSCESS N/A 03/22/2018   Procedure: INCISION AND DRAINAGE ABSCESS;  Surgeon: Violeta Gelinas, MD;  Location: [name] OR;  Service: General;  Laterality: N/A;  . TEE WITHOUT CARDIOVERSION  03/22/2018   Procedure: TRANSESOPHAGEAL ECHOCARDIOGRAM (TEE);  Surgeon: Radiologist, Medication, MD;  Location: [facility] OR;  Service: Open Heart Surgery;;   HPI:  45yo male admitted 10/07/20 with hyperglycemia and AMS. PMH: Substance and ETOH abuse/use, chronic C. difficile, DM2, HTN, hepatitis C, dyslipidemia. Pt found to have DKA s/p PEA arrest 4/10 requiring intubation and ventilation. Extubated on 10/10/2020 post ~4 days of intubation. MRI revealed numerous small bilateral cerebral hemisphere infarcts. CT Angio of Chest revealed "proximal right lower lobe bronchi retained secretions versus mucous  plugging with associated right lower consolidative airspace process/atelectasis"; minor peripheral bilateral lower lobe PE's (no right heart strain). Pt has an NG present; O2 support of 2L [place].   Assessment / Plan / Recommendation Clinical Impression  Pt seen at bedside for very limited evaluation, given lethargy and decreased ability to follow commands and participate fully. Wet voice quality  elicited with application of cold wet washcloth. Oral care was completed with suction, with removal of slight secretions. Pt made no attempt to accept ice chip, or manipulate it when placed in the oral cavity. Ice was removed from oral cavity and no further PO trials were offered given high risk of aspiration. SLP consulted with RN and MD - will likely proceed with PEG placement. ST will continue to follow to assess readiness for PO intake.   SLP Visit Diagnosis: Dysphagia, unspecified (R13.10)    Aspiration Risk  Severe aspiration risk;Risk for inadequate nutrition/hydration    Diet Recommendation NPO   Medication Administration: Via alternative means    Other  Recommendations Oral Care Recommendations: Oral care QID   Follow up Recommendations 24 hour supervision/assistance;Skilled Nursing facility      Frequency and Duration min 1 x/week  2 weeks       Prognosis Prognosis for Safe Diet Advancement: Fair Barriers to Reach Goals: Severity of deficits      Swallow Study   General Date of Onset: 10/07/20 HPI: 45yo male admitted 10/07/20 with hyperglycemia and AMS. PMH: Substance and ETOH abuse/use, chronic C. difficile, DM2, HTN, hepatitis C, dyslipidemia. Pt found to have DKA s/p PEA arrest 4/10 requiring intubation and ventilation. Extubated on 10/10/2020 post ~4 days of intubation. MRI revealed numerous small bilateral cerebral hemisphere infarcts. CT Angio of Chest revealed "proximal right lower lobe bronchi retained secretions versus mucous  plugging with associated right lower consolidative airspace process/atelectasis"; minor peripheral bilateral lower lobe PE's (no right heart strain). Pt has an NG present; O2 support of 2L . Type of Study: Bedside Swallow Evaluation Diet Prior to this Study: NPO;NG Tube Temperature Spikes Noted: No Respiratory  Status: Room air History of Recent Intubation: Yes Length of Intubations (days): 4 days Date extubated:  10/10/20 Behavior/Cognition: Doesn't follow directions;Lethargic/Drowsy Oral Cavity Assessment: Dry Oral Care Completed by SLP: Yes Oral Cavity - Dentition: Other (Comment) (unable to assess) Self-Feeding Abilities: Total assist Patient Positioning: Partially reclined Baseline Vocal Quality: Wet Volitional Cough: Cognitively unable to elicit Volitional Swallow: Unable to elicit    Oral/Motor/Sensory Function Overall Oral Motor/Sensory Function: Other (comment) (unable to assess)   Ice Chips Ice chips: Impaired Oral Phase Impairments: Reduced labial seal;Reduced lingual movement/coordination;Poor awareness of bolus   Thin Liquid Thin Liquid: Not tested    Nectar Thick Nectar Thick Liquid: Not tested   Honey Thick Honey Thick Liquid: Not tested   Puree Puree: Not tested   Solid     Solid: Not tested     Tony Long, [facility], CCC-SLP Speech Language Pathologist  [place] [place] 10/15/2020,10:45 AM

## 2020-10-15 NOTE — Progress Notes (Signed)
Inpatient Diabetes Program Recommendations  AACE/ADA: New Consensus Statement on Inpatient Glycemic Control (2015)  Target Ranges:  Prepandial:   less than 140 mg/dL      Peak postprandial:   less than 180 mg/dL (1-2 hours)      Critically ill patients:  140 - 180 mg/dL   Results for Tony Long, Tony Long (MRN [id number]) as of 10/15/2020 07:36  Ref. Range 10/12/2020 23:06 10/13/2020 03:56 10/13/2020 06:44 10/13/2020 10:37 10/13/2020 11:41 10/13/2020 14:07 10/13/2020 16:08 10/13/2020 19:50 10/13/2020 19:54 10/13/2020 21:10 10/13/2020 23:07 10/13/2020 23:34  Glucose-Capillary Latest Ref Range: 70 - 99 mg/dL 295 (H)  2 units NOVOLOG  80 293 (H)  367 (H) 349 (H)  15 units LANTUS @1 :06pm 309 (H) 343 (H) 59 (L) 59 (L) 87 67 (L) 167 (H)   Results for Tony Long, Tony Long (MRN [name]) as of 10/15/2020 07:36  Ref. Range 10/14/2020 00:38 10/14/2020 04:04 10/14/2020 07:45 10/14/2020 11:51 10/14/2020 16:51 10/14/2020 19:35  Glucose-Capillary Latest Ref Range: 70 - 99 mg/dL 10/16/2020 (H) 102 (H) 75 725 (H)  5 units LANTUS @12 :05pm 174 (H) 149 (H)   Results for Tony Long, Tony Long (MRN ) as of 10/15/2020 10:33  Ref. Range 10/14/2020 23:23 10/15/2020 03:43 10/15/2020 08:19  Glucose-Capillary Latest Ref Range: 70 - 99 mg/dL 10/16/2020 (H)  2 units NOVOLOG  280 (H)  3 units NOVOLOG  232 (H)  2 units NOVOLOG  5 units LANTUS    Home DM Meds: Lantus 30 units daily       Humalog 10 units tid with meals  Current Orders: Lantus 5 units Daily      Novolog 0-6 units Q4 hours   Note Hypoglycemic events on PM of 04/16  Lantus dose reduced  CBGs now >200 since 12am today   MD- Please consider adding back Novolog Tube Feed Coverage (per RN tube feeds running 60cc/hr)  Novolog 3 units Q4 hours  HOLD if tube feeds HELD for any reason    --Will follow patient during hospitalization--  956 RN, MSN, CDE Diabetes Coordinator Inpatient Glycemic Control Team Team Pager: [phone] (8a-5p)

## 2020-10-15 NOTE — TOC Initial Note (Signed)
Transition of Care [facility]) - Initial/Assessment Note    Patient Details  Name: Tony Long MRN: [id number] Date of Birth: 09-06-1975  Transition of Care [facility] CM/SW Contact:    Warsaw Cellar, RN Phone Number: 10/15/2020, 11:53 AM  Clinical Narrative:                 Spoke to Tony Long, mother who confirmed patient lives with his fiance and sometimes with his parents. Tony Long reports they are a strong and supportive family and plan to take care of patient as needed however understand that patient will most likely need rehab prior to returning home. Discussed CIR vs SNF and pros/cons for both. Discussed difficulty with placement related to history of drugs/alcohol abuse. Tony Long very understanding and appreciative of TOC involvement and understands discharge plan will be dependent on recovery and needs at discharge.   Expected Discharge Plan: IP Rehab Facility Barriers to Discharge: Continued Medical Work up   Patient Goals and CMS Choice Patient states their goals for this hospitalization and ongoing recovery are:: Mother reports her goal for patient are to return home      Expected Discharge Plan and Services Expected Discharge Plan: IP Rehab Facility       Living arrangements for the past 2 months: Single Family Home                                      Prior Living Arrangements/Services Living arrangements for the past 2 months: Single Family Home Lives with:: Significant Other Patient language and need for interpreter reviewed:: Yes Do you feel safe going back to the place where you live?: Yes      Need for Family Participation in Patient Care: Yes (Comment) Care giver support system in place?: Yes (comment)   Criminal Activity/Legal Involvement Pertinent to Current Situation/Hospitalization: No - Comment as needed  Activities of Daily Living Home Assistive Devices/Equipment: Dan Humphreys (specify type) ADL Screening (condition at time of admission) Patient's  cognitive ability adequate to safely complete daily activities?: Yes Is the patient deaf or have difficulty hearing?: No Does the patient have difficulty seeing, even when wearing glasses/contacts?: No Does the patient have difficulty concentrating, remembering, or making decisions?: No Patient able to express need for assistance with ADLs?: Yes Does the patient have difficulty dressing or bathing?: No Independently performs ADLs?: Yes (appropriate for developmental age) Does the patient have difficulty walking or climbing stairs?: No Weakness of Legs: Both Weakness of Arms/Hands: Both  Permission Sought/Granted                  Emotional Assessment Appearance:: Appears stated age Attitude/Demeanor/Rapport: Unable to Assess Affect (typically observed): Unable to Assess   Alcohol / Substance Use: Alcohol Use,Illicit Drugs,Tobacco Use Psych Involvement: No (comment)  Admission diagnosis:  DKA (diabetic ketoacidosis) (HCC) [E11.10] Protein-calorie malnutrition, severe (HCC) [E43] Chronic hepatitis C without hepatic coma (HCC) [B18.2] Diabetic ketoacidosis without coma associated with type 2 diabetes mellitus (HCC) [E11.10] Patient Active Problem List   Diagnosis Date Noted  . Malnutrition of moderate degree 10/08/2020  . Pancytopenia (HCC) 10/06/2020  . Acute metabolic encephalopathy 10/06/2020  . Hypotension 10/06/2020  . Hypomagnesemia 10/06/2020  . Hospital discharge follow-up 09/12/2020  . Protein-calorie malnutrition, severe 05/29/2020  . C. difficile diarrhea 05/28/2020  . Hypokalemia 05/28/2020  . Lactic acidosis 05/28/2020  . History of intravenous drug abuse (HCC) 05/28/2020  . Acute alcoholic pancreatitis 05/28/2020  .  Diabetic polyneuropathy associated with type 2 diabetes mellitus (HCC) 05/28/2020  . Open wound of right thigh 05/28/2020  . Alcohol abuse 05/28/2020  . Pressure injury of skin 04/08/2020  . Abscess 04/07/2020  . DKA (diabetic ketoacidosis)  (HCC) 04/06/2020  . Hemoglobin A1C greater than 9%, indicating poor diabetic control 08/09/2019  . History of delayed wound healing 08/09/2019  . IV drug user 08/09/2019  . Neuropathy 03/29/2019  . Wound of right foot 03/29/2019  . Essential hypertension 05/17/2018  . 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 Opioid use disorder, severe, dependence (HCC) 03/21/2018  . Osteomyelitis (HCC) 03/20/2018  . Chronic hepatitis C without hepatic coma (HCC) 07/02/2016   PCP:  [name], FNP Pharmacy:   [facility] [address] [place] - [address] [place] [address] Phone: [phone] Fax: [phone]  [name] [address] [place] [address] Phone: [phone] Fax: [phone]     Social Determinants of Health (SDOH) Interventions    Readmission Risk Interventions No flowsheet data found.

## 2020-10-15 NOTE — Progress Notes (Addendum)
PROGRESS NOTE    Tony Long  NID:[id number] DOB: October 26, 1975 DOA: 10/11/2020 PCP: Azzie Glatter, FNP  Brief Narrative: 45 year old male chronically ill secondary to uncontrolled diabetes mellitus and ongoing alcohol abuse, was admitted to [facility] on 4/9 with DKA and hypokalemia, suffered a PEA arrest on early 4/10 secondary to severe hypokalemia, K<2 and severe hypoglycemia (CBG 21) achieved ROSC in 13 minutes, subsequently intubated and admitted to the ICU, post arrest had acute hypoxic respiratory failure with concern for aspiration pneumonitis, also has anoxic encephalopathy as well as multiple ischemic infarcts on MRI brain.  In the ICU also had CTA chest which showed mild very minor distal bilateral PE. -All this in the background of chronic hep C, alcohol and opiate abuse and uncontrolled diabetes mellitus -Extubated 4/13 and transferred to [facility] service 4/14 -Remains obtunded, NG tube placed and tube feeds started   Assessment & Plan:   S/p PEA arrest - Suspected to be secondary to severe hypoglycemia and hypokalemia - Estimated 13 minutes downtime, CPR initiated immediately- found in PEA, received 0 defibrillations, suspected anoxic injury. CBG 21 - per PCCM Patient deemed not a candidate for TTM due to unclear downtime  - briefly on pressors, then weaned off - Echocardiogram 4/10-LVEF 14-43%, LV diastolic parameters normal, RV systolic function mildly reduced, no significant valve disease -Monitor electrolytes closely, monitor on telemetry -Now with hypotension in setting of profuse diarrhea, add NS'@100ml' /hr  Acute Hypoxic Respiratory Failure in the setting of PEA arrest, Aspiration Pneumonitis, & Minor Peripheral Bilateral LL Pulmonary Emboli -Intubated on 4/10 and extubated 4/13 -Imaging also concerning for suspected aspiration pneumonitis, completed 7 days of Unasyn, antibiotics discontinued 4/17  unfortunately continues to be at high risk for recurrent  aspiration -Bronchodilators as needed -On full dose Lovenox,  -Transition to [place] after PEG tube  Dysphagia -Secondary to anoxic encephalopathy, CVA etc. -SLP following, failed swallow eval 4/14, started tube feeds,  -Discussed with SLP again today, not appropriate to swallow safely -Will place PEG tube, discussed with patient's mother who agreed  Anoxic encephalopathy Acute Ischemic CVA Alcohol abuse, risk of withdrawal, drinks 1/5 of liquor daily -Off Precedex drip 4/14 -Day 8 of hospitalization do not suspect alcohol withdrawal anymore, discontinued Q1h Ativan -Changed to Ativan every 6 h PRN, started low-dose Seroquel qpm -continue gabapentin, decrease dose to twice daily -Continue high-dose thiamine -Continue therapy SLP, PT OT eval -IR consulted for PEG tube  Acute/subacute infarcts - MRI Brain 4/11 with numerous small acute/early subacute infarcts within bilateral cerebral hemispheres (embolic vs. Watershed ischemia) -Likely following PEA arrest -Neurology following, appreciate input, MRA ordered -Continue full dose Lovenox  DKA  -On admission, resolved  Severe hypoglycemia Type 2 diabetes mellitus -Continue sliding scale insulin, restarted tube feeds,  -Brittle diabetes with hyper and hypoglycemia, CBGs stable, add NovoLog every 4, continue low-dose Lantus -Hemoglobin A1c is 11.1  History of recurrent C Diff   Profuse diarrhea secondary to tube feeds -C Diff Ag positive, toxin negative, PCR positive  -Followed by ID, Dr. Baxter Flattery. He was initially diagnosed in November, has completed vanco x 2, dificid, then most recently vanco taper (started 3/16, should be on 1 capsule every other day for 4 weeks now, last day treatment 5/3). Referred to [facility] for fecal transplant (appt at [name] 5/23), considered for zinplava infusion.  -Continue vanco taper  -Continues to have worsening diarrhea since tube feeds started, does not have fever or leukocytosis, discussed with RD, add  Imodium twice daily -Blood pressure soft, add normal saline  Pancytopenia Chronic macrocytic anemia -Likely secondary to alcoholism, anemia panel suggestive of chronic disease and iron deficiency -Given IV iron 4/15, hemoglobin trending up, continue to monitor  Transaminitis Alcoholic liver disease and hep C -Continue to trend   DVT prophylaxis: Full dose Lovenox Code Status: Full code Family Communication: No family at bedside, called and updated patient's mother Disposition Plan:  Status is: Inpatient  Remains inpatient appropriate because:Inpatient level of care appropriate due to severity of illness   Dispo: The patient is from: Home              Anticipated d/c is to: SNF              Patient currently is not medically stable to d/c.   Difficult to place patient No   Consultants:   PCCM Tx, Neuro   Procedures:   Antimicrobials:    Subjective: -Continues to have profuse diarrhea, remains somnolent but arousable  Objective: Vitals:   10/15/20 1000 10/15/20 1100 10/15/20 1200 10/15/20 1300  BP: (!) 89/57 (!) 87/57 (!) 90/59 (!) 88/57  Pulse: 100 88 86 87  Resp: (!) 23 (!) 21 (!) 21 (!) 22  Temp:   97.8 F (36.6 C)   TempSrc:   Axillary   SpO2: 100% 100% 100% 100%  Weight:      Height:        Intake/Output Summary (Last 24 hours) at 10/15/2020 1417 Last data filed at 10/15/2020 1400 Gross per 24 hour  Intake 3963.46 ml  Output 5070 ml  Net -1106.54 ml   Filed Weights   10/14/20 0415 10/15/20 0043 10/15/20 0500  Weight: 62 kg 75.5 kg 59.8 kg    Examination:  General exam: chronically ill male laying in bed, somnolent but arousable, mumbles, follows few commands HEENT: NG tube with tube feeds infusing, left IJ central line noted CVS: S1S2/RRR Lungs: poor air movement B/L Abd: soft, NT, BS present GU: Flexi-Seal with watery diarrhea and condom catheter noted Extremities: No edema Neuro: Moves all extremities, poor effort, no overt localizing  signs noted  Psych: Unable to assess     Data Reviewed:   CBC: Recent Labs  Lab 10/11/20 0517 10/12/20 0430 10/13/20 0500 10/14/20 0353 10/15/20 0351  WBC 6.3 7.6 8.1 9.1 12.4*  HGB 7.2* 7.3* 7.9* 8.1* 9.6*  HCT 21.9* 22.9* 24.5* 25.0* 29.1*  MCV 106.8* 108.0* 106.1* 105.9* 104.7*  PLT 82* 122* 92* 85* 562*   Basic Metabolic Panel: Recent Labs  Lab 10/09/20 0435 10/10/20 0326 10/11/20 0517 10/12/20 0430 10/13/20 0500 10/13/20 1804 10/14/20 0353 10/15/20 0351  NA 145 144 149* 148* 149* 147* 146* 137  K 3.8 3.0* 3.0* 3.1* 2.9* 2.9* 3.8 4.9  CL 111 111 113* 115* 119* 120* 123* 116*  CO2 29 26 21* 15* 21* 21* 21* 16*  GLUCOSE 114* 189* 120* 160* 283* 194* 127* 321*  BUN '6 9 11 16 14 14 13 18  ' CREATININE 0.56* 0.68 0.77 1.12 0.84 0.72 0.70 0.81  CALCIUM 7.6* 7.7* 7.7* 8.0* 8.5* 8.3* 8.7* 9.0  MG 1.9 1.7 1.9 1.8 2.1  --   --  2.0  PHOS 3.2  --  4.2 4.6 4.0  --   --  3.3   GFR: Estimated Creatinine Clearance: 98.4 mL/min (by C-G formula based on SCr of 0.81 mg/dL). Liver Function Tests: Recent Labs  Lab 10/10/20 0326 10/11/20 0517 10/12/20 0430 10/13/20 0500 10/15/20 0351  AST 87* 67* 60* 30 33  ALT 78* 57* 56* 46* 41  ALKPHOS 234* 184* 201* 227* 280*  BILITOT 1.0 1.3* 2.9* 1.6* 1.0  PROT 5.0* 4.9* 4.9* 5.2* 6.0*  ALBUMIN 1.7* 1.7* 1.6* 1.7* 1.9*   No results for input(s): LIPASE, AMYLASE in the last 168 hours. No results for input(s): AMMONIA in the last 168 hours. Coagulation Profile: Recent Labs  Lab 10/09/20 0435 10/10/20 0326 10/11/20 0517 10/12/20 0430 10/13/20 0500  INR 1.2 1.1 1.4* 1.2 1.1   Cardiac Enzymes: No results for input(s): CKTOTAL, CKMB, CKMBINDEX, TROPONINI in the last 168 hours. BNP (last 3 results) No results for input(s): PROBNP in the last 8760 hours. HbA1C: No results for input(s): HGBA1C in the last 72 hours. CBG: Recent Labs  Lab 10/14/20 1935 10/14/20 2323 10/15/20 0343 10/15/20 0819 10/15/20 1133  GLUCAP 149*  212* 280* 232* 322*   Lipid Profile: Recent Labs    10/13/20 0500  CHOL 120  HDL 11*  LDLCALC 75  TRIG 170*  CHOLHDL 10.9   Thyroid Function Tests: No results for input(s): TSH, T4TOTAL, FREET4, T3FREE, THYROIDAB in the last 72 hours. Anemia Panel: No results for input(s): VITAMINB12, FOLATE, FERRITIN, TIBC, IRON, RETICCTPCT in the last 72 hours. Urine analysis:    Component Value Date/Time   COLORURINE YELLOW (A) 10/24/2020 2253   APPEARANCEUR CLEAR (A) 10/09/2020 2253   LABSPEC 1.017 10/18/2020 2253   PHURINE 6.0 10/20/2020 2253   GLUCOSEU >=500 (A) 10/13/2020 2253   HGBUR NEGATIVE 10/23/2020 2253   BILIRUBINUR NEGATIVE 10/12/2020 2253   BILIRUBINUR negative 06/12/2020 1541   BILIRUBINUR neg 02/29/2020 0918   KETONESUR 20 (A) 09/30/2020 2253   PROTEINUR NEGATIVE 10/14/2020 2253   UROBILINOGEN 0.2 06/12/2020 1541   UROBILINOGEN 1.0 12/11/2016 1020   NITRITE NEGATIVE 10/10/2020 2253   LEUKOCYTESUR NEGATIVE 10/04/2020 2253   Sepsis Labs: '@LABRCNTIP' (procalcitonin:4,lacticidven:4)  ) Recent Results (from the past 240 hour(s))  Resp Panel by RT-PCR (Flu A&B, Covid) Nasopharyngeal Swab     Status: None   Collection Time: 10/01/2020  9:13 PM   Specimen: Nasopharyngeal Swab; Nasopharyngeal(NP) swabs in vial transport medium  Result Value Ref Range Status   SARS Coronavirus 2 by RT PCR NEGATIVE NEGATIVE Final    Comment: (NOTE) SARS-CoV-2 target nucleic acids are NOT DETECTED.  The SARS-CoV-2 RNA is generally detectable in upper respiratory specimens during the acute phase of infection. The lowest concentration of SARS-CoV-2 viral copies this assay can detect is 138 copies/mL. A negative result does not preclude SARS-Cov-2 infection and should not be used as the sole basis for treatment or other patient management decisions. A negative result may occur with  improper specimen collection/handling, submission of specimen other than nasopharyngeal swab, presence of viral  mutation(s) within the areas targeted by this assay, and inadequate number of viral copies(<138 copies/mL). A negative result must be combined with clinical observations, patient history, and epidemiological information. The expected result is Negative.  Fact Sheet for Patients:  EntrepreneurPulse.com.au  Fact Sheet for Healthcare Providers:  IncredibleEmployment.be  This test is no t yet approved or cleared by the [place] FDA and  has been authorized for detection and/or diagnosis of SARS-CoV-2 by FDA under an Emergency Use Authorization (EUA). This EUA will remain  in effect (meaning this test can be used) for the duration of the COVID-19 declaration under Section 564(b)(1) of the Act, 21 U.S.C.section 360bbb-3(b)(1), unless the authorization is terminated  or revoked sooner.       Influenza A by PCR NEGATIVE NEGATIVE Final   Influenza B by PCR NEGATIVE NEGATIVE Final  Comment: (NOTE) The Xpert Xpress SARS-CoV-2/FLU/RSV plus assay is intended as an aid in the diagnosis of influenza from Nasopharyngeal swab specimens and should not be used as a sole basis for treatment. Nasal washings and aspirates are unacceptable for Xpert Xpress SARS-CoV-2/FLU/RSV testing.  Fact Sheet for Patients: EntrepreneurPulse.com.au  Fact Sheet for Healthcare Providers: IncredibleEmployment.be  This test is not yet approved or cleared by the [place] FDA and has been authorized for detection and/or diagnosis of SARS-CoV-2 by FDA under an Emergency Use Authorization (EUA). This EUA will remain in effect (meaning this test can be used) for the duration of the COVID-19 declaration under Section 564(b)(1) of the Act, 21 U.S.C. section 360bbb-3(b)(1), unless the authorization is terminated or revoked.  Performed at [name], [address] [place], [place] [address]   Blood Culture (routine x 2)      Status: None   Collection Time: 10/27/2020  9:57 PM   Specimen: BLOOD  Result Value Ref Range Status   Specimen Description BLOOD  LEFT HAND  Final   Special Requests   Final    BOTTLES DRAWN AEROBIC AND ANAEROBIC Blood Culture adequate volume   Culture   Final    NO GROWTH 5 DAYS Performed at [name], [address] [place], [place] [address]    Report Status 10/10/2020 FINAL  Final  Blood Culture (routine x 2)     Status: None   Collection Time: 10/13/2020  9:57 PM   Specimen: BLOOD  Result Value Ref Range Status   Specimen Description BLOOD  LEFT HAND  Final   Special Requests   Final    BOTTLES DRAWN AEROBIC AND ANAEROBIC Blood Culture adequate volume   Culture   Final    NO GROWTH 5 DAYS Performed at [facility], [address] [place], [place] [address]    Report Status 10/10/2020 FINAL  Final  Urine culture     Status: Abnormal   Collection Time: 10/26/2020 10:53 PM   Specimen: In/Out Cath Urine  Result Value Ref Range Status   Specimen Description   Final    IN/OUT CATH URINE Performed at [facility], [address], [place], [place] [address]    Special Requests   Final    NONE Performed at [facility], [place], [place] [address]    Culture (A)  Final    4,000 COLONIES/mL STAPHYLOCOCCUS EPIDERMIDIS 100 COLONIES/mL ESCHERICHIA COLI    Report Status 10/08/2020 FINAL  Final   Organism ID, Bacteria STAPHYLOCOCCUS EPIDERMIDIS (A)  Final   Organism ID, Bacteria ESCHERICHIA COLI (A)  Final      Susceptibility   Escherichia coli - MIC*    AMPICILLIN <=2 SENSITIVE Sensitive     CEFAZOLIN <=4 SENSITIVE Sensitive     CEFEPIME <=0.12 SENSITIVE Sensitive     CEFTRIAXONE <=0.25 SENSITIVE Sensitive     CIPROFLOXACIN <=0.25 SENSITIVE Sensitive     GENTAMICIN <=1 SENSITIVE Sensitive     IMIPENEM <=0.25 SENSITIVE Sensitive     NITROFURANTOIN <=16 SENSITIVE Sensitive     TRIMETH/SULFA <=20 SENSITIVE Sensitive      AMPICILLIN/SULBACTAM <=2 SENSITIVE Sensitive     PIP/TAZO <=4 SENSITIVE Sensitive     * 100 COLONIES/mL ESCHERICHIA COLI   Staphylococcus epidermidis - MIC*    CIPROFLOXACIN <=0.5 SENSITIVE Sensitive     GENTAMICIN <=0.5 SENSITIVE Sensitive     NITROFURANTOIN <=16 SENSITIVE Sensitive     OXACILLIN >=4 RESISTANT Resistant  TETRACYCLINE <=1 SENSITIVE Sensitive     VANCOMYCIN 1 SENSITIVE Sensitive     TRIMETH/SULFA <=10 SENSITIVE Sensitive     CLINDAMYCIN 2 INTERMEDIATE Intermediate     RIFAMPIN <=0.5 SENSITIVE Sensitive     Inducible Clindamycin NEGATIVE Sensitive     * 4,000 COLONIES/mL STAPHYLOCOCCUS EPIDERMIDIS  MRSA PCR Screening     Status: Abnormal   Collection Time: 10/06/20  1:10 AM   Specimen: Nasal Mucosa; Nasopharyngeal  Result Value Ref Range Status   MRSA by PCR POSITIVE (A) NEGATIVE Final    Comment:        The GeneXpert MRSA Assay (FDA approved for NASAL specimens only), is one component of a comprehensive MRSA colonization surveillance program. It is not intended to diagnose MRSA infection nor to guide or monitor treatment for MRSA infections. Oren Beckmann RN [phone] 10/06/2020 JG Performed at [facility], [place], [place] [address]   C Difficile Quick Screen w PCR reflex     Status: Abnormal   Collection Time: 10/06/20  1:30 AM   Specimen: STOOL  Result Value Ref Range Status   C Diff antigen POSITIVE (A) NEGATIVE Final   C Diff toxin NEGATIVE NEGATIVE Final   C Diff interpretation Results are indeterminate. See PCR results.  Final    Comment: Performed at [facility], [address] [place], [place] [address]  Gastrointestinal Panel by PCR , Stool     Status: None   Collection Time: 10/06/20  1:30 AM   Specimen: Nasal Mucosa; Stool  Result Value Ref Range Status   Campylobacter species NOT DETECTED NOT DETECTED Final   Plesimonas shigelloides NOT DETECTED NOT DETECTED Final   Salmonella species NOT  DETECTED NOT DETECTED Final   Yersinia enterocolitica NOT DETECTED NOT DETECTED Final   Vibrio species NOT DETECTED NOT DETECTED Final   Vibrio cholerae NOT DETECTED NOT DETECTED Final   Enteroaggregative E coli (EAEC) NOT DETECTED NOT DETECTED Final   Enteropathogenic E coli (EPEC) NOT DETECTED NOT DETECTED Final   Enterotoxigenic E coli (ETEC) NOT DETECTED NOT DETECTED Final   Shiga like toxin producing E coli (STEC) NOT DETECTED NOT DETECTED Final   Shigella/Enteroinvasive E coli (EIEC) NOT DETECTED NOT DETECTED Final   Cryptosporidium NOT DETECTED NOT DETECTED Final   Cyclospora cayetanensis NOT DETECTED NOT DETECTED Final   Entamoeba histolytica NOT DETECTED NOT DETECTED Final   Giardia lamblia NOT DETECTED NOT DETECTED Final   Adenovirus F40/41 NOT DETECTED NOT DETECTED Final   Astrovirus NOT DETECTED NOT DETECTED Final   Norovirus GI/GII NOT DETECTED NOT DETECTED Final   Rotavirus A NOT DETECTED NOT DETECTED Final   Sapovirus (I, II, IV, and V) NOT DETECTED NOT DETECTED Final    Comment: Performed at [name], [address] [place], [place] [address]  C. Diff by PCR, Reflexed     Status: Abnormal   Collection Time: 10/06/20  1:30 AM  Result Value Ref Range Status   Toxigenic C. Difficile by PCR POSITIVE (A) NEGATIVE Final    Comment: Positive for toxigenic C. difficile with little to no toxin production. Only treat if clinical presentation suggests symptomatic illness. Performed at [facility], [address] [place], [place] [address]   Culture, Respiratory w Gram Stain     Status: None   Collection Time: 10/07/20  5:00 AM   Specimen: Tracheal Aspirate; Respiratory  Result Value Ref Range Status   Specimen Description   Final    TRACHEAL ASPIRATE  Performed at [facility], [address], [place], [place] [address]    Special Requests   Final    NONE Performed at [facility], [place], [place] [address]     Gram Stain   Final    FEW WBC PRESENT,BOTH PMN AND MONONUCLEAR RARE SQUAMOUS EPITHELIAL CELLS PRESENT NO ORGANISMS SEEN    Culture   Final    FEW Consistent with normal respiratory flora. No Pseudomonas species isolated Performed at [facility] [address] [place], [place] [address]    Report Status 10/09/2020 FINAL  Final    Radiology Studies: DG Chest Port 1 View  Result Date: 10/13/2020 CLINICAL DATA:  Hypoxia. EXAM: PORTABLE CHEST 1 VIEW COMPARISON:  10/09/2020 chest radiograph and CT. FINDINGS: Endotracheal tube has been removed. Nasogastric tube is coiled in the left upper abdomen near the gastric fundus. Right jugular central venous catheter in the SVC region. Hazy and patchy densities at the left lung base are similar to the previous examination. Volume loss in the right hemithorax. Heart size is stable. Trachea is midline. IMPRESSION: 1. Low lung volumes with persistent basilar chest densities. Findings could be related to volume loss and/or consolidation. Findings have minimally changed since the chest radiograph on 10/09/2020. 2. Support apparatuses as described. Electronically Signed   By: Markus Daft M.D.   On: 10/13/2020 14:47   Scheduled Meds: . chlorhexidine gluconate (MEDLINE KIT)  15 mL Mouth Rinse BID  . Chlorhexidine Gluconate Cloth  6 each Topical Daily  . enoxaparin (LOVENOX) injection  1 mg/kg Subcutaneous Q12H  . famotidine  20 mg Per Tube BID  . fiber  1 packet Per Tube BID  . folic acid  1 mg Per Tube Daily  . free water  350 mL Per Tube Q4H  . gabapentin  300 mg Per Tube Q12H  . insulin aspart  0-6 Units Subcutaneous Q4H  . insulin aspart  2 Units Subcutaneous Q4H  . insulin glargine  5 Units Subcutaneous Daily  . loperamide  2 mg Oral BID  . midodrine  5 mg Per Tube TID WC  . multivitamin with minerals  1 tablet Per Tube Daily  . QUEtiapine  50 mg Per Tube QHS  . saccharomyces boulardii  250 mg Per Tube BID  . sodium chloride flush  10-40 mL  Intracatheter Q12H  . thiamine  100 mg Per Tube Daily   Or  . thiamine  100 mg Intravenous Daily  . vancomycin  125 mg Per Tube QODAY   Continuous Infusions: . sodium chloride Stopped (10/08/20 1436)  . sodium chloride    . sodium chloride 100 mL/hr at 10/15/20 1409  . feeding supplement (GLUCERNA 1.5 CAL) 1,000 mL (10/11/20 1610)     LOS: 10 days    Time spent: 65mn  PDomenic Polite MD [facility]  10/15/2020, 2:17 PM

## 2020-10-16 ENCOUNTER — Inpatient Hospital Stay: Payer: Medicaid Other

## 2020-10-16 DIAGNOSIS — B182 Chronic viral hepatitis C: Secondary | ICD-10-CM | POA: Diagnosis not present

## 2020-10-16 DIAGNOSIS — E0810 Diabetes mellitus due to underlying condition with ketoacidosis without coma: Secondary | ICD-10-CM | POA: Diagnosis not present

## 2020-10-16 DIAGNOSIS — J9601 Acute respiratory failure with hypoxia: Secondary | ICD-10-CM | POA: Diagnosis not present

## 2020-10-16 LAB — COMPREHENSIVE METABOLIC PANEL
ALT: 36 U/L (ref 0–44)
AST: 31 U/L (ref 15–41)
Albumin: 1.9 g/dL — ABNORMAL LOW (ref 3.5–5.0)
Alkaline Phosphatase: 257 U/L — ABNORMAL HIGH (ref 38–126)
Anion gap: 5 (ref 5–15)
BUN: 22 mg/dL — ABNORMAL HIGH (ref 6–20)
CO2: 14 mmol/L — ABNORMAL LOW (ref 22–32)
Calcium: 8.7 mg/dL — ABNORMAL LOW (ref 8.9–10.3)
Chloride: 119 mmol/L — ABNORMAL HIGH (ref 98–111)
Creatinine, Ser: 0.88 mg/dL (ref 0.61–1.24)
GFR, Estimated: >60 mL/min (ref >60)
Glucose, Bld: 231 mg/dL — ABNORMAL HIGH (ref 70–99)
Potassium: 4.7 mmol/L (ref 3.5–5.1)
Sodium: 138 mmol/L (ref 135–145)
Total Bilirubin: 1 mg/dL (ref 0.3–1.2)
Total Protein: 6 g/dL — ABNORMAL LOW (ref 6.5–8.1)

## 2020-10-16 LAB — GLUCOSE, CAPILLARY
Glucose-Capillary: 105 mg/dL — ABNORMAL HIGH (ref 70–99)
Glucose-Capillary: 122 mg/dL — ABNORMAL HIGH (ref 70–99)
Glucose-Capillary: 131 mg/dL — ABNORMAL HIGH (ref 70–99)
Glucose-Capillary: 162 mg/dL — ABNORMAL HIGH (ref 70–99)
Glucose-Capillary: 169 mg/dL — ABNORMAL HIGH (ref 70–99)
Glucose-Capillary: 174 mg/dL — ABNORMAL HIGH (ref 70–99)
Glucose-Capillary: 246 mg/dL — ABNORMAL HIGH (ref 70–99)

## 2020-10-16 LAB — CBC
HCT: 26.2 % — ABNORMAL LOW (ref 39.0–52.0)
Hemoglobin: 8.5 g/dL — ABNORMAL LOW (ref 13.0–17.0)
MCH: 35 pg — ABNORMAL HIGH (ref 26.0–34.0)
MCHC: 32.4 g/dL (ref 30.0–36.0)
MCV: 107.8 fL — ABNORMAL HIGH (ref 80.0–100.0)
Platelets: 119 10*3/uL — ABNORMAL LOW (ref 150–400)
RBC: 2.43 MIL/uL — ABNORMAL LOW (ref 4.22–5.81)
RDW: 16.5 % — ABNORMAL HIGH (ref 11.5–15.5)
WBC: 14.4 10*3/uL — ABNORMAL HIGH (ref 4.0–10.5)
nRBC: 0 % (ref 0.0–0.2)

## 2020-10-16 MED ORDER — IPRATROPIUM-ALBUTEROL 0.5-2.5 (3) MG/3ML IN SOLN
RESPIRATORY_TRACT | Status: AC
  Administered 2020-10-16: 3 mL
  Filled 2020-10-16: qty 3

## 2020-10-16 MED ORDER — LOPERAMIDE HCL 2 MG PO CAPS
2.0000 mg | ORAL_CAPSULE | Freq: Three times a day (TID) | ORAL | Status: DC
  Administered 2020-10-16 – 2020-10-17 (×3): 2 mg via ORAL
  Filled 2020-10-16 (×3): qty 1

## 2020-10-16 MED ORDER — NUTRISOURCE FIBER PO PACK
1.0000 | PACK | Freq: Three times a day (TID) | ORAL | Status: DC
  Administered 2020-10-16 – 2020-10-17 (×3): 1
  Filled 2020-10-16 (×5): qty 1

## 2020-10-16 NOTE — Progress Notes (Signed)
Dr. Antionette Char called about urine output being 50cc since 1900. MD with no new orders. Will continue to monitor at this time.

## 2020-10-16 NOTE — Progress Notes (Addendum)
PROGRESS NOTE    Tony Long  NOM:[id number] DOB: 05/26/1976 DOA: 09/30/2020 PCP: Azzie Glatter, FNP  Brief Narrative: 44/M chronically ill secondary to uncontrolled diabetes mellitus and ongoing alcohol abuse, was admitted to [facility] on 4/9 with DKA and hypokalemia, suffered a PEA arrest on early 4/10 secondary to severe hypokalemia, K<2 and severe hypoglycemia (CBG 21) achieved ROSC in 13 minutes, subsequently intubated and admitted to the ICU, post arrest had acute hypoxic respiratory failure with concern for aspiration pneumonitis, also has anoxic encephalopathy as well as multiple ischemic infarcts on MRI brain.  In the ICU also had CTA chest which showed mild very minor distal bilateral PE. -All this in the background of chronic hep C, alcohol and opiate abuse and uncontrolled diabetes mellitus -Extubated 4/13 and transferred to [facility] service 4/14 -He has history of recurrent C. difficile colitis, on a vancomycin taper, has had worsening diarrhea after starting tube feeds, now on Imodium and Benefiber -Remains obtunded, NG tube placed and tube feeds started   Assessment & Plan:   S/p PEA arrest - Suspected to be secondary to severe hypoglycemia and hypokalemia - Estimated 13 minutes downtime, CPR initiated immediately- found in PEA, received 0 defibrillations, suspected anoxic injury. CBG 21 - per PCCM Patient deemed not a candidate for TTM due to unclear downtime  - briefly on pressors, then weaned off - Echocardiogram 4/10-LVEF 96-28%, LV diastolic parameters normal, RV systolic function mildly reduced, no significant valve disease -Monitor on telemetry -Now with hypotension in setting of profuse diarrhea, continue normal saline at 100 mL/h until diarrhea slows down  Acute Hypoxic Respiratory Failure in the setting of PEA arrest, Aspiration Pneumonitis, & Minor Peripheral Bilateral LL Pulmonary Emboli -Intubated on 4/10 and extubated 4/13 -Imaging also concerning for suspected  aspiration pneumonitis, completed 7 days of Unasyn, antibiotics discontinued 4/17 -unfortunately continues to be at high risk for recurrent aspiration -Bronchodilators as needed -On full dose Lovenox,  -Transition to [place] after PEG tube  Dysphagia -Secondary to anoxic encephalopathy, CVA etc. -SLP following, failed swallow eval 4/14, started tube feeds,  -Discussed with SLP again 4/18, not appropriate to swallow safely -Will place PEG tube, discussed with patient's mother who agreed  Anoxic encephalopathy Acute Ischemic CVA Alcohol abuse, risk of withdrawal, drinks 1/5 of liquor daily -Off Precedex drip 4/14 -Was also initially treated for alcohol withdrawal, too late for this now -Changed to Ativan Q 6 h PRN, started low-dose Seroquel qpm -continue gabapentin, decreased dose to twice daily -Continue high-dose thiamine -Continue therapy SLP, PT OT eval -IR consulted for PEG tube  Acute/subacute infarcts - MRI Brain 4/11 with numerous small acute/early subacute infarcts within bilateral cerebral hemispheres (embolic vs. Watershed ischemia) -Likely following PEA arrest -Neurology following, appreciate input, MRA ordered -Continue full dose Lovenox  DKA  -On admission, resolved  Severe hypoglycemia Type 2 diabetes mellitus -Continue sliding scale insulin, restarted tube feeds,  -Brittle diabetes with hyper and hypoglycemia, CBGs stable, add NovoLog every 4, continue low-dose Lantus -Hemoglobin A1c is 11.1  History of recurrent C Diff   Profuse diarrhea secondary to tube feeds -Followed by ID, Dr. Baxter Flattery. He was initially diagnosed in November, has completed vanco x 2, dificid, then most recently vanco taper (started 3/16, should be on 1 capsule every other day for 4 weeks now, last day treatment 5/3). Referred to [facility] for fecal transplant (appt at [facility] 5/23), considered for zinplava infusion.  -Continue vanco taper  -Continues to have worsening diarrhea since tube feeds  started, does not  have fever or signif leukocytosis, discussed with RD, started on Benefiber, increase Imodium  -Blood pressure soft, continue IV fluids today  Pancytopenia Chronic macrocytic anemia -Likely secondary to alcoholism, anemia panel suggestive of chronic disease and iron deficiency -Given IV iron 4/15, hemoglobin trending up, continue to monitor  Transaminitis Alcoholic liver disease and hep C -Continue to trend   DVT prophylaxis: Full dose Lovenox Code Status: Full code Family Communication: No family at bedside, called and updated patient's mother yesterday Disposition Plan:  Status is: Inpatient  Remains inpatient appropriate because:Inpatient level of care appropriate due to severity of illness   Dispo: The patient is from: Home              Anticipated d/c is to: SNF              Patient currently is not medically stable to d/c.   Difficult to place patient No   Consultants:   PCCM Tx, Neuro   Procedures:   Antimicrobials:    Subjective: -Continues to have profuse diarrhea, more lucid today  Objective: Vitals:   10/16/20 0400 10/16/20 0422 10/16/20 0500 10/16/20 0600  BP: 108/68  (!) 83/63 (!) 89/61  Pulse: 98  93 85  Resp: 20  (!) 21 20  Temp:   98.8 F (37.1 C)   TempSrc:   Oral   SpO2: 100%  100% 100%  Weight:  59.5 kg    Height:        Intake/Output Summary (Last 24 hours) at 10/16/2020 1457 Last data filed at 10/16/2020 0600 Gross per 24 hour  Intake 4412.96 ml  Output 1800 ml  Net 2612.96 ml   Filed Weights   10/15/20 0043 10/15/20 0500 10/16/20 0422  Weight: 75.5 kg 59.8 kg 59.5 kg    Examination:  General exam: Chronically ill young male, laying in bed, more interactive today, answers some questions, oriented to self and partly to place HEENT: NG tube with tube feeds infusing, right IJ central line noted CVS: S1-S2, regular rate rhythm Lungs: Poor air movement bilaterally Abdomen: Soft, nontender, bowel sounds  present GU: Flexi-Seal with profuse watery diarrhea and condom catheter noted Extremities: Extremity emaciated, no edema  Neuro: Moves all extremities, poor effort, no overt localizing signs noted  Psych: Unable to assess     Data Reviewed:   CBC: Recent Labs  Lab 10/12/20 0430 10/13/20 0500 10/14/20 0353 10/15/20 0351 10/16/20 0423  WBC 7.6 8.1 9.1 12.4* 14.4*  HGB 7.3* 7.9* 8.1* 9.6* 8.5*  HCT 22.9* 24.5* 25.0* 29.1* 26.2*  MCV 108.0* 106.1* 105.9* 104.7* 107.8*  PLT 122* 92* 85* 103* 701*   Basic Metabolic Panel: Recent Labs  Lab 10/10/20 0326 10/11/20 0517 10/12/20 0430 10/13/20 0500 10/13/20 1804 10/14/20 0353 10/15/20 0351 10/16/20 0423  NA 144 149* 148* 149* 147* 146* 137 138  K 3.0* 3.0* 3.1* 2.9* 2.9* 3.8 4.9 4.7  CL 111 113* 115* 119* 120* 123* 116* 119*  CO2 26 21* 15* 21* 21* 21* 16* 14*  GLUCOSE 189* 120* 160* 283* 194* 127* 321* 231*  BUN '9 11 16 14 14 13 18 ' 22*  CREATININE 0.68 0.77 1.12 0.84 0.72 0.70 0.81 0.88  CALCIUM 7.7* 7.7* 8.0* 8.5* 8.3* 8.7* 9.0 8.7*  MG 1.7 1.9 1.8 2.1  --   --  2.0  --   PHOS  --  4.2 4.6 4.0  --   --  3.3  --    GFR: Estimated Creatinine Clearance: 90.2 mL/min (by C-G formula  based on SCr of 0.88 mg/dL). Liver Function Tests: Recent Labs  Lab 10/11/20 0517 10/12/20 0430 10/13/20 0500 10/15/20 0351 10/16/20 0423  AST 67* 60* 30 33 31  ALT 57* 56* 46* 41 36  ALKPHOS 184* 201* 227* 280* 257*  BILITOT 1.3* 2.9* 1.6* 1.0 1.0  PROT 4.9* 4.9* 5.2* 6.0* 6.0*  ALBUMIN 1.7* 1.6* 1.7* 1.9* 1.9*   No results for input(s): LIPASE, AMYLASE in the last 168 hours. No results for input(s): AMMONIA in the last 168 hours. Coagulation Profile: Recent Labs  Lab 10/10/20 0326 10/11/20 0517 10/12/20 0430 10/13/20 0500  INR 1.1 1.4* 1.2 1.1   Cardiac Enzymes: No results for input(s): CKTOTAL, CKMB, CKMBINDEX, TROPONINI in the last 168 hours. BNP (last 3 results) No results for input(s): PROBNP in the last 8760  hours. HbA1C: No results for input(s): HGBA1C in the last 72 hours. CBG: Recent Labs  Lab 10/15/20 2304 10/16/20 0138 10/16/20 0352 10/16/20 0711 10/16/20 1149  GLUCAP 79 131* 169* 174* 246*   Lipid Profile: No results for input(s): CHOL, HDL, LDLCALC, TRIG, CHOLHDL, LDLDIRECT in the last 72 hours. Thyroid Function Tests: No results for input(s): TSH, T4TOTAL, FREET4, T3FREE, THYROIDAB in the last 72 hours. Anemia Panel: No results for input(s): VITAMINB12, FOLATE, FERRITIN, TIBC, IRON, RETICCTPCT in the last 72 hours. Urine analysis:    Component Value Date/Time   COLORURINE YELLOW (A) 10/12/2020 2253   APPEARANCEUR CLEAR (A) 10/19/2020 2253   LABSPEC 1.017 10/11/2020 2253   PHURINE 6.0 10/08/2020 2253   GLUCOSEU >=500 (A) 09/30/2020 2253   HGBUR NEGATIVE 10/24/2020 2253   BILIRUBINUR NEGATIVE 10/12/2020 2253   BILIRUBINUR negative 06/12/2020 1541   BILIRUBINUR neg 02/29/2020 0918   KETONESUR 20 (A) 10/03/2020 2253   PROTEINUR NEGATIVE 09/29/2020 2253   UROBILINOGEN 0.2 06/12/2020 1541   UROBILINOGEN 1.0 12/11/2016 1020   NITRITE NEGATIVE 10/18/2020 2253   LEUKOCYTESUR NEGATIVE 10/24/2020 2253   Sepsis Labs: '@LABRCNTIP' (procalcitonin:4,lacticidven:4)  ) Recent Results (from the past 240 hour(s))  Culture, Respiratory w Gram Stain     Status: None   Collection Time: 10/07/20  5:00 AM   Specimen: Tracheal Aspirate; Respiratory  Result Value Ref Range Status   Specimen Description   Final    TRACHEAL ASPIRATE Performed at [name], [address], [place], [place] [address]    Special Requests   Final    NONE Performed at [facility], [address] [place], [place] [address]    Gram Stain   Final    FEW WBC PRESENT,BOTH PMN AND MONONUCLEAR RARE SQUAMOUS EPITHELIAL CELLS PRESENT NO ORGANISMS SEEN    Culture   Final    FEW Consistent with normal respiratory flora. No Pseudomonas species isolated Performed at [facility] [address], [place],  [address]    Report Status 10/09/2020 FINAL  Final    Radiology Studies: No results found. Scheduled Meds: . chlorhexidine gluconate (MEDLINE KIT)  15 mL Mouth Rinse BID  . Chlorhexidine Gluconate Cloth  6 each Topical Daily  . enoxaparin (LOVENOX) injection  1 mg/kg Subcutaneous Q12H  . famotidine  20 mg Per Tube BID  . fiber  1 packet Per Tube TID  . folic acid  1 mg Per Tube Daily  . free water  350 mL Per Tube Q4H  . gabapentin  300 mg Per Tube Q12H  . insulin aspart  0-6 Units Subcutaneous Q4H  . insulin aspart  2 Units Subcutaneous Q4H  . insulin glargine  5 Units Subcutaneous Daily  . loperamide  2 mg Oral TID  . midodrine  5 mg Per Tube TID WC  . multivitamin with minerals  1 tablet Per Tube Daily  . QUEtiapine  50 mg Per Tube QHS  . saccharomyces boulardii  250 mg Per Tube BID  . sodium chloride flush  10-40 mL Intracatheter Q12H  . thiamine  100 mg Per Tube Daily   Or  . thiamine  100 mg Intravenous Daily  . vancomycin  125 mg Per Tube QODAY   Continuous Infusions: . sodium chloride Stopped (10/08/20 1436)  . sodium chloride    . sodium chloride 100 mL/hr at 10/16/20 1038  . feeding supplement (GLUCERNA 1.5 CAL) 60 mL/hr at 10/16/20 0600     LOS: 11 days   Time spent: 42mn  PDomenic Polite MD [facility]  10/16/2020, 2:57 PM

## 2020-10-16 NOTE — Progress Notes (Addendum)
Upon initial assessment, pt noted to have audible rhonchi and stridor. Pt denies dyspnea at this point, has a weak cough and unable to expectorate any secretions. Ongoing NG feed stopped for now. Charge RN, RT, and MD informed. Stat CXR completed. Duo neb given. CPT (bed) delivered. After which, pt was able to expectorate moderate amounts of white/tan thick secretions with aid of oral suctioning. No further orders placed for now. Will continue to monitor.

## 2020-10-17 DIAGNOSIS — E872 Acidosis: Secondary | ICD-10-CM

## 2020-10-17 DIAGNOSIS — E0811 Diabetes mellitus due to underlying condition with ketoacidosis with coma: Secondary | ICD-10-CM

## 2020-10-17 DIAGNOSIS — I639 Cerebral infarction, unspecified: Secondary | ICD-10-CM

## 2020-10-17 LAB — CBC
HCT: 23.4 % — ABNORMAL LOW (ref 39.0–52.0)
Hemoglobin: 7.6 g/dL — ABNORMAL LOW (ref 13.0–17.0)
MCH: 34.9 pg — ABNORMAL HIGH (ref 26.0–34.0)
MCHC: 32.5 g/dL (ref 30.0–36.0)
MCV: 107.3 fL — ABNORMAL HIGH (ref 80.0–100.0)
Platelets: 108 10*3/uL — ABNORMAL LOW (ref 150–400)
RBC: 2.18 MIL/uL — ABNORMAL LOW (ref 4.22–5.81)
RDW: 16.1 % — ABNORMAL HIGH (ref 11.5–15.5)
WBC: 11.3 10*3/uL — ABNORMAL HIGH (ref 4.0–10.5)
nRBC: 0.2 % (ref 0.0–0.2)

## 2020-10-17 LAB — BASIC METABOLIC PANEL
Anion gap: 6 (ref 5–15)
BUN: 19 mg/dL (ref 6–20)
CO2: 14 mmol/L — ABNORMAL LOW (ref 22–32)
Calcium: 8.4 mg/dL — ABNORMAL LOW (ref 8.9–10.3)
Chloride: 117 mmol/L — ABNORMAL HIGH (ref 98–111)
Creatinine, Ser: 0.68 mg/dL (ref 0.61–1.24)
GFR, Estimated: >60 mL/min (ref >60)
Glucose, Bld: 116 mg/dL — ABNORMAL HIGH (ref 70–99)
Potassium: 3.2 mmol/L — ABNORMAL LOW (ref 3.5–5.1)
Sodium: 137 mmol/L (ref 135–145)

## 2020-10-17 LAB — GLUCOSE, CAPILLARY
Glucose-Capillary: 118 mg/dL — ABNORMAL HIGH (ref 70–99)
Glucose-Capillary: 132 mg/dL — ABNORMAL HIGH (ref 70–99)
Glucose-Capillary: 235 mg/dL — ABNORMAL HIGH (ref 70–99)

## 2020-10-17 MED ORDER — DEXTROSE 5 % IV SOLN: INTRAVENOUS | Status: DC

## 2020-10-17 MED ORDER — MORPHINE BOLUS VIA INFUSION
5.0000 mg | INTRAVENOUS | Status: DC | PRN
  Administered 2020-10-17 (×2): 5 mg via INTRAVENOUS
  Filled 2020-10-17: qty 5

## 2020-10-17 MED ORDER — MORPHINE 100MG IN NS 100ML (1MG/ML) PREMIX INFUSION
0.0000 mg/h | INTRAVENOUS | Status: DC
  Administered 2020-10-17: 5 mg/h via INTRAVENOUS
  Filled 2020-10-17: qty 100

## 2020-10-17 MED ORDER — GLYCOPYRROLATE 0.2 MG/ML IJ SOLN
0.2000 mg | INTRAMUSCULAR | Status: DC | PRN
  Filled 2020-10-17: qty 1

## 2020-10-17 MED ORDER — MIDAZOLAM HCL 2 MG/2ML IJ SOLN
2.0000 mg | INTRAMUSCULAR | Status: DC | PRN
  Administered 2020-10-17 (×2): 2 mg via INTRAVENOUS
  Filled 2020-10-17 (×2): qty 2

## 2020-10-17 MED ORDER — MIDODRINE HCL 5 MG PO TABS: 10.0000 mg | ORAL_TABLET | Freq: Three times a day (TID) | ORAL | Status: DC

## 2020-10-17 MED ORDER — ARFORMOTEROL TARTRATE 15 MCG/2ML IN NEBU
15.0000 ug | INHALATION_SOLUTION | Freq: Two times a day (BID) | RESPIRATORY_TRACT | Status: DC
  Administered 2020-10-17: 15 ug via RESPIRATORY_TRACT
  Filled 2020-10-17 (×2): qty 2

## 2020-10-17 MED ORDER — ACETAMINOPHEN 325 MG PO TABS: 650.0000 mg | ORAL_TABLET | Freq: Four times a day (QID) | ORAL | Status: DC | PRN

## 2020-10-17 MED ORDER — GLYCOPYRROLATE 1 MG PO TABS
1.0000 mg | ORAL_TABLET | ORAL | Status: DC | PRN
  Filled 2020-10-17: qty 1

## 2020-10-17 MED ORDER — ACETAMINOPHEN 650 MG RE SUPP: 650.0000 mg | Freq: Four times a day (QID) | RECTAL | Status: DC | PRN

## 2020-10-17 MED ORDER — MORPHINE SULFATE (PF) 2 MG/ML IV SOLN: 2.0000 mg | INTRAVENOUS | Status: DC | PRN

## 2020-10-17 MED ORDER — IPRATROPIUM-ALBUTEROL 0.5-2.5 (3) MG/3ML IN SOLN
3.0000 mL | RESPIRATORY_TRACT | Status: DC | PRN
  Administered 2020-10-17 (×2): 3 mL via RESPIRATORY_TRACT
  Filled 2020-10-17 (×2): qty 3

## 2020-10-17 MED ORDER — DIPHENHYDRAMINE HCL 50 MG/ML IJ SOLN: 25.0000 mg | INTRAMUSCULAR | Status: DC | PRN

## 2020-10-17 MED ORDER — BUDESONIDE 0.25 MG/2ML IN SUSP
0.2500 mg | Freq: Two times a day (BID) | RESPIRATORY_TRACT | Status: DC
  Administered 2020-10-17: 0.25 mg via RESPIRATORY_TRACT
  Filled 2020-10-17: qty 2

## 2020-10-17 MED ORDER — POTASSIUM CHLORIDE 20 MEQ PO PACK
40.0000 meq | PACK | Freq: Once | ORAL | Status: AC
  Administered 2020-10-17: 40 meq
  Filled 2020-10-17: qty 2

## 2020-10-17 MED ORDER — POLYVINYL ALCOHOL 1.4 % OP SOLN
1.0000 [drp] | Freq: Four times a day (QID) | OPHTHALMIC | Status: DC | PRN
  Filled 2020-10-17: qty 15

## 2020-10-17 MED ORDER — GLYCOPYRROLATE 0.2 MG/ML IJ SOLN
0.2000 mg | INTRAMUSCULAR | Status: DC | PRN
Start: 2020-10-17 — End: 2020-10-18
  Administered 2020-10-17: 0.2 mg via SUBCUTANEOUS

## 2020-10-28 NOTE — Progress Notes (Signed)
Stridor not relieved by duo nebs. MD made aware. New orders for nebulizer treatments received and administered without effect. MD made aware. MD at bedside to see pt, new ordser for increased midodrine. Oxygen requiremets increasing and non-re breather mask placed on pt. Tube feeds stopped for airway protection. Family at bedside. Pt made DNR and RN may pronounce.

## 2020-10-28 NOTE — Progress Notes (Signed)
GOALS OF CARE DISCUSSION  The Clinical status was relayed to family in detail. Mother and Father at bedside  Updated and notified of patients medical condition.  Patient remains unresponsive and will not open eyes to command.    [name] having a weak cough and struggling to remove secretions.   Patient with increased WOB and using accessory muscles to breathe Explained to family course of therapy and the modalities  Patient with severe resp distress and failure from severe b/l acute vocal cord paralysis    Patient with Progressive multiorgan failure with a very high probablity of a very minimal chance of meaningful recovery despite all aggressive and optimal medical therapy. [name] in the Dying  Process associated with Suffering.  The Mother and Father has agreed that he would NOT want to live on artifical support, he would NOT want a tracheostomy, patient will have poor quality and life and he would NOT want to live with poor quality of life   Family understands the situation.  They have consented and agreed to DNR/DNI and would like to proceed with Comfort care measures.  Family are satisfied with Plan of action and management. All questions answered  Additional CC time 45 mins   Milan Perkins Santiago Glad, M.D.  [facility]  Medical Director [facility] Medical Director [facility] Cardio-Pulmonary Department

## 2020-10-28 NOTE — Progress Notes (Signed)
Nutrition Follow Up Note   DOCUMENTATION CODES:   Non-severe (moderate) malnutrition in context of social or environmental circumstances  INTERVENTION:   Continue Glucerna 1.5_0 /hr  Free water flushes 371m q4 hours per MD  Regimen provides 2160kcal/day, 119g/day protein, 23g/day fiber and 31967mday free water   Continue MVI, folic acid and thiamine daily in setting of substance abuse   Add Nutrisource Fiber TID via tube- provides 9g of soluble fiber   NUTRITION DIAGNOSIS:   Moderate Malnutrition related to social / environmental circumstances (subtance abuse) as evidenced by mild fat depletion,moderate fat depletion,mild muscle depletion,moderate muscle depletion.  GOAL:   Patient will meet greater than or equal to 90% of their needs  -met with tube feeds   MONITOR:   Diet advancement,Labs,Weight trends,Skin,I & O's,TF tolerance  ASSESSMENT:   [age].o. caucasian male with medical history significant for chronic C. difficile, type II diabetes mellitus, hypertension, hepatitis C, dyslipidemia, substance abuse and alcohol abuse who presented to the emergency room with acute onset of hyperglycemia with altered mental status with mild confusion. Pt found to have DKA s/p PEA arrest 4/10 requiring intubation and ventilation   Pt tolerating tube feeds well via NGT. Refeed labs stabilizing. Pt had an episode of coughing with white/tan thick secretions during suctioning overnight; tube feeds were held but have since been restarted after radiograph noted clear lungs with gastric tube in place. Pt was seen by SLP on 4/18 who recommended continued NPO. IR evaluated pt for G-tube; CT scan revealed likely severe enteritis with distal antrum distended with fluid and wall thickening involving the pylorus and entire duodenum. Pt determined not to be a good candidate for G-tube as GOO could not be ruled out. Free water flushes increased to 35051m 4 hours per MD. Per chart, pt is down ~8lbs(6%)  since admit. Pt -1.8L on his I & Os. Pt continues to have large amounts of diarrhea via rectal tube; Nutrisource fiber and probiotics were initiated to try and help with the diarrhea. Per RN, diarrhea is decreasing in amount and becoming less runny.    Medications reviewed and include: lovenox, pepcid, folic acid, insulin, MVI, florastor, thiamine, vancomycin, NaCl _1 /hr  Labs reviewed: K 3.2(L) P 3.3 wnl, Mg 2.0 wnl- 4/18 Wbc- 11.3(H), Hgb 7.6(L), Hct 23.4(L), MCV 107.3(H), MCH 34.9(H) cbgs- 118, 132 x 24 hrs  UOP- [id number] Order:   Diet Order            Diet NPO time specified  Diet effective now                EDUCATION NEEDS:   No education needs have been identified at this time  Skin:  Skin Assessment: Reviewed RN Assessment (Stage II buttocks)  Last BM:  4/20- type 7- 1800ml52m rectal tube  Height:   Ht Readings from Last 1 Encounters:  10/06/20 _2  (1.803 m)    Weight:   Wt Readings from Last 1 Encounters:  04/202022/05/15 kg    Ideal Body Weight:  78 kg  BMI:  Body mass index is 18.39 kg/m.  Estimated Nutritional Needs:   Kcal:  2100-2400kcal/day  Protein:  100-120g/day  Fluid:  2.1-2.4L/day  CaseyKoleen DistanceRD, LDN Please refer to [facility] SurgeryRD and/or RD on-call/weekend/after hours pager

## 2020-10-28 NOTE — Progress Notes (Signed)
Family at bedside morphine gtt and boluses administered for comfort. Robinul and prn versed administered.

## 2020-10-28 NOTE — Progress Notes (Signed)
2 RNs verified pt passed away at 1645. MD made aware. Funeral release form filled out. E-link to call CDS. Pt glasses and cell phone released to pt significant other Angelina Ok.

## 2020-10-28 NOTE — Discharge Summary (Signed)
Death Summary  Tony Long TKW:[id number] DOB: Mar 13, 1976 DOA: 10/13/20  PCP: [name], FNP PCP/Office notified:   Admit date: Oct 13, 2020 Date of Death: 10/25/20  Final Diagnoses:  Principal Problem:   DKA (diabetic ketoacidosis) (HCC) Active Problems:   C. difficile diarrhea   Hypokalemia   Lactic acidosis   Pancytopenia (HCC)   Acute metabolic encephalopathy   Hypotension   Hypomagnesemia   Malnutrition of moderate degree     History of present illness:  HPI was taken from Dr. Arville Care: Tony Long is a 45 y.o. Caucasian male with medical history significant for C. difficile diarrhea, type II diabetes mellitus, hypertension, hepatitis C, dyslipidemia and alcohol abuse, who presented to the emergency room with acute onset of hyperglycemia with altered mental status with mild confusion.  Patient admitted to having diarrhea without improvement for the last 8 months.  No bright red bleeding per rectum or melena.  He denied any significant change in appetite.  He did not miss his insulin.  He denies any polyuria and polydipsia.  No dysuria, oliguria or hematuria or flank pain.  No nausea or vomiting or abdominal pain.  No chest pain or dyspnea or cough or wheezing.  ED Course: When he came to the ER initial blood pressure was 77/54 with otherwise normal vital signs.  Blood pressure came up to 110/67 with hydration and heart rate was up to 106.  Labs revealed hypokalemia of less than 2 and hypochloremia 90 hyperglycemia of 521 and calcium of 7.8 with anion gap of 27 and CO2 was 18.  Lactic acid was more than 11 and later 10.4.  CBC showed anemia with hemoglobin of 8.3 and hematocrit 25.4 slightly lower than previous levels in December of last year.  Urinalysis showed more than 500 glucose and 20 ketones with hyaline casts.  Blood and urine cultures were drawn.    EKG as reviewed by me : Showed normal sinus rhythm with a rate of 98 with poor R wave progression and prolonged QT  interval with QTC of 579 MS, and T wave inversion inferiorly.   Imaging: Chest x-ray showed subtle nodular opacities in both lower lungs that may be sequela of infectious/inflammatory etiology with recommendation for chest CT without contrast.  The patient was given IV cefepime and Flagyl, 10 mg of IV potassium chloride and a dose of p.o. with 2 L of IV normal saline bolus.  He was started on IV insulin drip per Endo tool.  He will be admitted to a stepdown unit bed for further evaluation and management   Hospital course as per Dr. Jomarie Longs:  45/M chronically ill secondary to uncontrolled diabetes mellitus and ongoing alcohol abuse, was admitted to [facility] on 4/9 with DKA and hypokalemia, suffered a PEA arrest on early 4/10 secondary to severe hypokalemia, K<2 and severe hypoglycemia (CBG 21) achieved ROSC in 13 minutes, subsequently intubated and admitted to the ICU, post arrest had acute hypoxic respiratory failure with concern for aspiration pneumonitis, also has anoxic encephalopathy as well as multiple ischemic infarcts on MRI brain.  In the ICU also had CTA chest which showed mild very minor distal bilateral PE. -All this in the background of chronic hep C, alcohol and opiate abuse and uncontrolled diabetes mellitus -Extubated 4/13 and transferred to [facility] service 4/14 -He has history of recurrent C. difficile colitis, on a vancomycin taper, has had worsening diarrhea after starting tube feeds, now on Imodium and Benefiber -Remains obtunded, NG tube placed and tube feeds started   [facility]  course from Dr. Mayford Knife 10/18/2020: Pt was found to have stridor this morning. Stridor did not improved any bronchodilators. Saturating in 90s, CXR was neg. Pulmonary/ICU was consulted. ICU/pulmonary discussed pt's care and prognosis with the family and the family decided to make the pt comfort care only. Unfortunately, pt passed away today at 1645.      Time: approx 30 mins  Signed:  Charise Killian  [facility] 10/19/2020, 4:47 PM

## 2020-10-28 NOTE — Consult Note (Signed)
Tony Long, Tony Long 02/18/1976 Tony Killian, MD  Reason for Consult: Audible stridor  HPI: History documented noted in the chart.  Patient was previously intubated on 10 April, self-extubated on the 13th.  Has had some altered mental status since his arrest and has had a weak voice according to nurses since that time.  Dr. Belia Heman had called me at approximately 11:45 AM as he had developed audible stridor.  ENT was asked to evaluate the upper airway.  Allergies: No Known Allergies  ROS: Not obtainable as he is not responsive to questions.  PMH:  Past Medical History:  Diagnosis Date  . Alcohol use 02/2020  . Clostridioides difficile diarrhea 04/2020  . Diabetes mellitus without complication (HCC)   . Elevated liver enzymes 02/2020  . Elevated liver enzymes 02/2020  . Hyperlipidemia 02/2020  . Hypertension   . IV drug user   . Vitamin D deficiency 02/2020  . Wound healing, delayed     FH:  Family History  Problem Relation Age of Onset  . Heart disease Father   . Diabetes Father   . Diabetes Paternal Uncle   . Esophageal cancer Maternal Grandmother   . Colon cancer Neg Hx   . Pancreatic cancer Neg Hx   . Liver disease Neg Hx   . Stomach cancer Neg Hx     SH:  Social History   Socioeconomic History  . Marital status: Single    Spouse name: Not on file  . Number of children: Not on file  . Years of education: Not on file  . Highest education level: Not on file  Occupational History  . Not on file  Tobacco Use  . Smoking status: Current Every Day Smoker    Packs/day: 1.00    Types: Cigarettes  . Smokeless tobacco: Never Used  Vaping Use  . Vaping Use: Never used  Substance and Sexual Activity  . Alcohol use: Yes    Comment: 1 fifth liquor per week; daily   . Drug use: Not Currently    Types: Cocaine    Comment: patient reports being abstinent since 03/2019  . Sexual activity: Yes    Partners: Female  Other Topics Concern  . Not on file  Social  History Narrative  . Not on file   Social Determinants of Health   Financial Resource Strain: Not on file  Food Insecurity: Not on file  Transportation Needs: Not on file  Physical Activity: Not on file  Stress: Not on file  Social Connections: Not on file  Intimate Partner Violence: Not on file    PSH:  Past Surgical History:  Procedure Laterality Date  . INCISION AND DRAINAGE ABSCESS N/A 03/22/2018   Procedure: INCISION AND DRAINAGE ABSCESS;  Surgeon: Violeta Gelinas, MD;  Location: [facility] OR;  Service: General;  Laterality: N/A;  . TEE WITHOUT CARDIOVERSION  03/22/2018   Procedure: TRANSESOPHAGEAL ECHOCARDIOGRAM (TEE);  Surgeon: Radiologist, Medication, MD;  Location: [facility] OR;  Service: Open Heart Surgery;;    Physical  Exam: Patient was in the  icu bed obviously tachypneic with inspiratory stridor.  Unable to answer questions would not follow commands.  With Dr. Clovis Fredrickson assistance flexible fiberoptic laryngoscopy is performed at the bedside.  Using a flexible fiberoptic laryngoscope this was introduced through the left nostril.  Easily passed in the nasopharynx and into the glottic region.  Epiglottis appeared normal.  Examination of the vocal folds showed bilateral vocal cord paralysis with complete paralysis on the right and only minimal movement on  the left.  In an effort to see if any movement was occurring I asked the patient to sniff and try to vocalize but he would not follow commands.  The scope was removed without difficulty.  Neck exam showed no evidence of anterior mass.   A/P: Acute onset inspiratory stridor now with newly diagnosed bilateral vocal cord paralysis.  He has had a recent chest CT that did not show any mediastinal masses and a MRI of the head that showed some previous likely CVA but no intracranial mass that would lead to bilateral vocal paralysis.  It is most likely that the paralysis is related to the central nervous system injury.  Dr. Belia Heman and I spent significant  time at the bedside discussing with family the options.  Currently if the tachypnea continues the patient will need to be reintubated and due to the fact that this is his second intubation and the fact that he self extubated he will likely need a tracheostomy.  Following intubation this would be the safest way to protect the airway in an attempt to assess whether the vocal cords will recover mobility.  The family is deciding on how to proceed I will wait to hear from Dr. Belia Heman if indeed they choose to reintubate Mr. Dibello and proceed with tracheostomy.   Time spent in ICU for consult approximately 1 hour   Davina Poke 10/16/2020 12:26 PM

## 2020-10-28 NOTE — Progress Notes (Signed)
PROGRESS NOTE    Tony Long  XNT:[id number] DOB: Sep 10, 1975 DOA: 10/04/2020 PCP: Azzie Glatter, FNP   Assessment & Plan:   Principal Problem:   DKA (diabetic ketoacidosis) ([place]) Active Problems:   C. difficile diarrhea   Hypokalemia   Lactic acidosis   Pancytopenia (HCC)   Acute metabolic encephalopathy   Hypotension   Hypomagnesemia   Malnutrition of moderate degree  Failure to thrive: secondary to all below. Family decided to make the pt comfort care after discussed pt's condition/prognosis w/ pulmon/ICU. Continue w/ comfort care only   S/p PEA arrest: possibly secondary to severe hypoglycemia & hypokalemia. Estimated downtime of 13 mins. Echo showed EF 96-75%, diastolic function was normal, no significant valve disease.  Acute hypoxic respiratory failure: intubated on 10/07/20 and extubated on 10/10/20. Likely secondary to PEA arrest. Continue on supplemental oxygen and wean as tolerated  Aspiration pneumonitis: completed abx course on 10/14/20. Continue on bronchodilators and encourage incentive spirometry   B/l pulmonary embolism: continue on lovenox  Dysphagia: secondary to anoxic encephalopathy. Unable to do PEG tube as per IR. Continue w/ comfort care   Anoxic encephalopathy: continue w/ supportive care  Alcohol abuse: continue on ativan prn, seroquel. Continue on high dose thiamine. Alcohol cessation counseling  Hypokalemia: KCl repleated. Will continue to monitor   Acute/subacute CVA: MRI brain showed small acute/early subacute infarcts w/in b/l cerebral hemispheres (embolic vs watershed ischemia). Continue on lovenox. Neuro following and recs apprec  DKA: on admission. Resolved  DM2: w/ severe hypoglycemia. Poorly controlled w/ HbA1c 11.1. Continue on SSI w/ accuchecks   Diarrhea: w/ hx of recurrent c. diff. Continue on vanco taper. Initially diagnosed in November, has completed vanco x 2, dificid, then most recently vanco taper (started 3/16, should be on  1 capsule every other day for 4 weeks now, last day treatment 5/3). Likely secondary to tube feeds.   Bicytopenia: likely secondary to alcohol abuse. No need for a transfusion currently   Transaminitis: likely secondary to alcohol abuse. Will continue to monitor   Hx of HCV: continue w/ supportive care. Will need to f/u outpatient w/ GI or PCP    DVT prophylaxis: lovenox  Code Status: DNR Family Communication:  Disposition Plan: made comfort care today   Level of care: Stepdown   Status is: Inpatient  Remains inpatient appropriate because:Hemodynamically unstable, Ongoing active pain requiring inpatient pain management, Altered mental status, IV treatments appropriate due to intensity of illness or inability to take PO and Inpatient level of care appropriate due to severity of illness   Dispo: The patient is from: Home              Anticipated d/c is to: unclear as pt was made comfort care               Patient currently is not medically stable to d/c.   Difficult to place patient Yes     Consultants:   Pulmon  ICU  neuro   Procedures:   Antimicrobials:    Subjective: Pt is unable to verbalize his complaints.   Objective: Vitals:   27-Oct-2020 0416 2020-10-27 0429 2020-10-27 0500 2020/10/27 0600  BP:   91/64 98/67  Pulse:   82 80  Resp:   (!) 22 20  Temp:      TempSrc:      SpO2:  99% 98% 97%  Weight: 59.8 kg     Height:        Intake/Output Summary (Last 24 hours) at 10/27/2020 [phone]  Last data filed at Oct 29, 2020 0755 Gross per 24 hour  Intake 6216.5 ml  Output 3021 ml  Net 3195.5 ml   Filed Weights   10/15/20 0500 10/16/20 0422 10-29-20 0416  Weight: 59.8 kg 59.5 kg 59.8 kg    Examination:  General exam: Appears uncomfortable. Appears older than stated age   Respiratory system: course breath sounds b/l. Cardiovascular system: S1 & S2+. No rubs, gallops or clicks.  Gastrointestinal system: Abdomen is nondistended, soft and nontender. hyperactive bowel  sounds heard. Central nervous system: lethargic Psychiatry: Judgement and insight appear abnormal.     Data Reviewed: I have personally reviewed following labs and imaging studies  CBC: Recent Labs  Lab 10/13/20 0500 10/14/20 0353 10/15/20 0351 10/16/20 0423 2020/10/29 0452  WBC 8.1 9.1 12.4* 14.4* 11.3*  HGB 7.9* 8.1* 9.6* 8.5* 7.6*  HCT 24.5* 25.0* 29.1* 26.2* 23.4*  MCV 106.1* 105.9* 104.7* 107.8* 107.3*  PLT 92* 85* 103* 119* 937*   Basic Metabolic Panel: Recent Labs  Lab 10/11/20 0517 10/12/20 0430 10/13/20 0500 10/13/20 1804 10/14/20 0353 10/15/20 0351 10/16/20 0423 2020/10/29 0452  NA 149* 148* 149* 147* 146* 137 138 137  K 3.0* 3.1* 2.9* 2.9* 3.8 4.9 4.7 3.2*  CL 113* 115* 119* 120* 123* 116* 119* 117*  CO2 21* 15* 21* 21* 21* 16* 14* 14*  GLUCOSE 120* 160* 283* 194* 127* 321* 231* 116*  BUN '11 16 14 14 13 18 ' 22* 19  CREATININE 0.77 1.12 0.84 0.72 0.70 0.81 0.88 0.68  CALCIUM 7.7* 8.0* 8.5* 8.3* 8.7* 9.0 8.7* 8.4*  MG 1.9 1.8 2.1  --   --  2.0  --   --   PHOS 4.2 4.6 4.0  --   --  3.3  --   --    GFR: Estimated Creatinine Clearance: 99.7 mL/min (by C-G formula based on SCr of 0.68 mg/dL). Liver Function Tests: Recent Labs  Lab 10/11/20 0517 10/12/20 0430 10/13/20 0500 10/15/20 0351 10/16/20 0423  AST 67* 60* 30 33 31  ALT 57* 56* 46* 41 36  ALKPHOS 184* 201* 227* 280* 257*  BILITOT 1.3* 2.9* 1.6* 1.0 1.0  PROT 4.9* 4.9* 5.2* 6.0* 6.0*  ALBUMIN 1.7* 1.6* 1.7* 1.9* 1.9*   No results for input(s): LIPASE, AMYLASE in the last 168 hours. No results for input(s): AMMONIA in the last 168 hours. Coagulation Profile: Recent Labs  Lab 10/11/20 0517 10/12/20 0430 10/13/20 0500  INR 1.4* 1.2 1.1   Cardiac Enzymes: No results for input(s): CKTOTAL, CKMB, CKMBINDEX, TROPONINI in the last 168 hours. BNP (last 3 results) No results for input(s): PROBNP in the last 8760 hours. HbA1C: No results for input(s): HGBA1C in the last 72 hours. CBG: Recent  Labs  Lab 10/16/20 1731 10/16/20 2116 10/16/20 2338 2020-10-29 0351 10/29/20 0728  GLUCAP 162* 105* 122* 118* 132*   Lipid Profile: No results for input(s): CHOL, HDL, LDLCALC, TRIG, CHOLHDL, LDLDIRECT in the last 72 hours. Thyroid Function Tests: No results for input(s): TSH, T4TOTAL, FREET4, T3FREE, THYROIDAB in the last 72 hours. Anemia Panel: No results for input(s): VITAMINB12, FOLATE, FERRITIN, TIBC, IRON, RETICCTPCT in the last 72 hours. Sepsis Labs: No results for input(s): PROCALCITON, LATICACIDVEN in the last 168 hours.  No results found for this or any previous visit (from the past 240 hour(s)).       Radiology Studies: CT ABDOMEN WO CONTRAST  Result Date: 2020-10-29 CLINICAL DATA:  Cardiac arrest, respiratory failure, anoxic encephalopathy and recurrent C difficile infection. History of alcoholic  liver disease and hepatitis C infection. EXAM: CT ABDOMEN WITHOUT CONTRAST TECHNIQUE: Multidetector CT imaging of the abdomen was performed following the standard protocol without IV contrast. COMPARISON:  Prior CT of the abdomen and pelvis on 05/27/2020 as well as additional abdominal films. FINDINGS: Lower chest: Bilateral posterior lower lobe atelectasis and scarring. No hiatal hernia. Hepatobiliary: Severe hepatic steatosis. No overt cirrhotic contour of the liver. The gallbladder is decompressed. No biliary ductal dilatation identified. Pancreas: Atrophic pancreas.  No acute peripancreatic inflammation. Spleen: Normal in size without focal abnormality. Adrenals/Urinary Tract: Adrenal glands and kidneys are unremarkable. No hydronephrosis or renal calculi identified. Stomach/Bowel: Nasogastric tube extends into the gastric fundus. The stomach is normally positioned. There is debris within the stomach up to the point of the distal antrum. The distal antrum is distended with fluid and there is wall thickening involving the pylorus and entire duodenum. Severe diffuse wall thickening  extends throughout the visualized small bowel with bowel wall thickness of over 1 cm throughout the visualized jejunum. The pelvis was not imaged. There is some associated mild edema in the surrounding mesenteric fat. No evidence free intraperitoneal air or focal abscess. Vascular/Lymphatic: Aortic atherosclerosis without aneurysm. No enlarged abdominal lymph nodes. Other: No hernias or visualized ascites. Musculoskeletal: Stable decreased height of the anterior aspect of the T12 vertebral body which appears to be a developmental abnormality. IMPRESSION: 1. Severe hepatic steatosis. No overt cirrhotic contour of the liver. 2. Severe diffuse wall thickening throughout the visualized small bowel with bowel wall thickness of over 1 cm throughout the visualized jejunum. There is some associated mild edema in the surrounding mesenteric fat. Findings are consistent with severe enteritis and may be infectious or inflammatory. 3. Nasogastric tube extends into the gastric fundus. The distal antrum is distended with fluid and there is wall thickening involving the pylorus and entire duodenum. 4. Given appearance of the stomach, duodenum and entire visualized small bowel, the patient may not be a good candidate currently for percutaneous gastrostomy. Some degree of relative gastric outlet obstruction cannot be excluded by CT appearance and correlation may be helpful with EGD or at least a fluoroscopic contrast study of the stomach and small bowel. 5. Aortic atherosclerosis. Aortic Atherosclerosis (ICD10-I70.0). Electronically Signed   By: Aletta Edouard M.D.   On: 10/24/2020 08:00   DG Chest Port 1 View  Result Date: 10/16/2020 CLINICAL DATA:  Dyspnea EXAM: PORTABLE CHEST 1 VIEW COMPARISON:  10/13/2020 FINDINGS: Cardiac shadow is stable. Gastric catheter is again seen within the stomach. Right jugular central line is noted in satisfactory position. Lungs are clear bilaterally. No bony abnormality is seen. IMPRESSION: No  active disease. Electronically Signed   By: Inez Catalina M.D.   On: 10/16/2020 21:30        Scheduled Meds: . chlorhexidine gluconate (MEDLINE KIT)  15 mL Mouth Rinse BID  . Chlorhexidine Gluconate Cloth  6 each Topical Daily  . enoxaparin (LOVENOX) injection  1 mg/kg Subcutaneous Q12H  . famotidine  20 mg Per Tube BID  . fiber  1 packet Per Tube TID  . folic acid  1 mg Per Tube Daily  . free water  350 mL Per Tube Q4H  . gabapentin  300 mg Per Tube Q12H  . insulin aspart  0-6 Units Subcutaneous Q4H  . insulin aspart  2 Units Subcutaneous Q4H  . insulin glargine  5 Units Subcutaneous Daily  . loperamide  2 mg Oral TID  . midodrine  5 mg Per Tube TID WC  .  multivitamin with minerals  1 tablet Per Tube Daily  . QUEtiapine  50 mg Per Tube QHS  . saccharomyces boulardii  250 mg Per Tube BID  . sodium chloride flush  10-40 mL Intracatheter Q12H  . thiamine  100 mg Per Tube Daily   Or  . thiamine  100 mg Intravenous Daily  . vancomycin  125 mg Per Tube QODAY   Continuous Infusions: . sodium chloride Stopped (10/08/20 1436)  . sodium chloride    . sodium chloride 100 mL/hr at 10-29-20 0755  . feeding supplement (GLUCERNA 1.5 CAL) 60 mL/hr at 10/29/20 0600     LOS: 12 days    Time spent: 34 mins    Wyvonnia Dusky, MD [facility] Pager [phone]  If 7PM-7AM, please contact night-coverage 29-Oct-2020, 8:46 AM

## 2020-10-28 NOTE — Progress Notes (Signed)
SLP Cancellation Note  Patient Details Name: Tony Long MRN: [id number] DOB: 1975/10/31   Cancelled treatment:       Reason Eval/Treat Not Completed: Medical issues which prohibited therapy;Patient not medically ready;Patient's level of consciousness (chart reviewed). Noted ENT note today indicating bilateral vocal cord paralysis. Pt is exhibiting increased WOB w/ accessory muscles. MD has met w/ Family and discussed pt's progressive decline and overall status. Family agreed to DNR/DNI status and would like to proceed with Comfort Care measures when another daughter arrives.  ST services will sign off at this time. Available if any further needs arise.     Orinda Kenner, MS, CCC-SLP Speech Language Pathologist Rehab Services [phone] [facility] Nov 15, 2020, 3:41 PM

## 2020-10-28 NOTE — Progress Notes (Signed)
Pt given neb tx for bronchial hygiene. Stridor noted upon auscultation. RR non-labored. Hr/sats stable. bbs coarse rhonchi. Pt w/ productive cough. Will continue to follow for hygiene/prn nebs.

## 2020-10-28 NOTE — Progress Notes (Signed)
Pt monitored closely overnight, stridor and rhonchi still audible this AM. Frequent CPT and prn Duo nebs continued with RT, pt able to expectorate small amounts of tan/white secretions after interventions. Pt still denies any dyspnea, sats remain >95%. Humidified O2 at 2L via [place] started for comfort. Charge RN and MD updated on progress- no further orders for now, will continue to monitor.

## 2020-10-28 NOTE — Progress Notes (Signed)
NAME:  Tony Long, MRN:  [id number], DOB:  08/23/1975, LOS: 12 ADMISSION DATE:  10/01/2020  BRIEF SYNOPSIS  History of Present Illness:  45 year old male presented to the ED via EMS due to confusion and high blood sugar.  Per ED documentation EMS reported patient took insulin prior to their arrival and on presentation to the ED reported that he, " feels fine". ED course:  On 10/09/2020 patient was hypotensive with initial BP of 77/54, improving with IV fluid resuscitation.  Other vital signs stable: T 97.5, HR 93, RR 14 & SPO2 100% on room air. Significant labs: Severely hypokalemic at <2.0, chloride 90, high anion gap metabolic acidosis with a serum CO2 of 18 & AG of 27, glucose 521, lactic acidosis at greater than 11 > 10.4 & hemoglobin 8.3.  UA revealed greater than 500 Patient was given a dose of cefepime and Flagyl as well as potassium replacement and IV fluid resuscitation.  He was also started on an IV insulin drip per Endo tool protocol for DKA. Patient was admitted to stepdown.   On 10/06/2020 patient was transferred to telemetry unit after being transitioned off of insulin drip per Endo tool protocol for DKA.  At midnight care nurse said she saw the patient resting in his room.  Around 3 AM nurse noted the patient's heart rate was bradycardic in the 30s and trending down, upon arrival to the room the patient had no pulse.  CPR was initiated and CODE BLUE team was called, first pulse check demonstrated asystole and CPR was continued. He received 2 doses of epinephrine, 1 amp of bicarb, 1 amp of D50 since his CBG was 21.  The patient was intubated by the ED physician, and ROSC was obtained at approximately 03:13.  Patient was transferred to ICU and PCCM consulted for further management and monitoring. Pertinent  Medical History  Hypertension Hyperlipidemia Chronic C. Difficile (past 9 months) EtOH abuse (1/5 of liquor daily) Transaminitis Remote IV drug use  Significant Hospital  Events: Including procedures, antibiotic start and stop dates in addition to other pertinent events    10/26/2020-admit to stepdown on insulin drip for DKA  10/06/2020-transferred to telemetry once transitioned off insulin drip  10/07/2020-patient found in PEA, CODE BLUE called: Patient intubated requiring mechanical ventilation and ROSC achieved with transfer to ICU.  10/08/2020- Critically ill, MRI Brain and EEG pending today, Hbg 7.0, transfuse 1 unit pRBCs  10/09/2020- MRI yesterday with numerous small bilateral cerebral hemisphere infarcts, Neurology consulted, Hgb 8.6 s/p 1 unit pRBC yesterday, off Levophed, CTA Chest pending due to increased FiO2 requirements with clear CXR  10/10/2020- EXTUBATED. CTA Chest yesterday with minor peripheral bilateral lower lobe PE's (no right heart strain), Venous US of BLE negative for DVT.  Obtain speech evaluation, remain in Stepdown today for close observation post extubation  4/20 asked to re-evaluate for increased WOB      Interim History / Subjective:  Patient with increased WOB Severe SOB ENT consulted patient has severe b/l vocal cord paralysis Patient using accessory muscles to breathe Patient will need emergent intubation      Objective   Blood pressure (!) 80/56, pulse 85, temperature (!) 91.7 F (33.2 C), temperature source Oral, resp. rate (!) 23, height 5\' 11"  (1.803 m), weight 59.8 kg, SpO2 100 %.        Intake/Output Summary (Last 24 hours) at Nov 09, 2020 1224 Last data filed at Nov 09, 2020 0755 Gross per 24 hour  Intake 6216.5 ml  Output 3021 ml  Net  3195.5 ml   Filed Weights   10/15/20 0500 10/16/20 0422 11-14-2020 0416  Weight: 59.8 kg 59.5 kg 59.8 kg      REVIEW OF SYSTEMS  [name] UNABLE TO PROVIDE COMPLETE REVIEW OF SYSTEMS DUE TO SEVERE CRITICAL ILLNESS AND TOXIC METABOLIC ENCEPHALOPATHY   PHYSICAL EXAMINATION:  GENERAL:critically ill appearing, +resp distress +stridor this and cachexia MOUTH: Moist  mucosal membrane. NECK: Supple. +stridor PULMONARY: +rhonchi,  CARDIOVASCULAR: S1 and S2. Regular rate and rhythm. No murmurs, rubs, or gallops.  GASTROINTESTINAL: Soft, nontender, -distended. Positive bowel sounds.  MUSCULOSKELETAL: No swelling, clubbing, or edema.  NEUROLOGIC: obtunded SKIN:intact,warm,dry   Labs/imaging that I havepersonally reviewed  (right click and "Reselect all SmartList Selections" daily)       ASSESSMENT AND PLAN SYNOPSIS 45 year old male call abuse and recurrent C. difficile colitis, who suffered PEA cardiac arrest, requiring ACLS protocol and endotracheal intubation s/p self extubation,+Alcohol abuse, admitted for severe DKA and pneumonia, severe Acute hypoxic respiratory failure with Aspiration pneumonitis/pneumonia and acute bilateral pulmonary emboli with severe Acute metabolic encephalopathy from acute b/l strokes with, Recurrent C. difficile colitis   Severe ACUTE Hypoxic and Hypercapnic Respiratory Failure due to severe b/l vocal cord paralysis and acutev aspiration pneumonia Patient at very high risk for cardiac arrest and high risk for intubation  CARDIAC FAILURE-s/p PEA CODE BLUE  CARDIAC ICU monitoring    NEUROLOGY Acute toxic metabolic encephalopathy +ACUTE CVA's   ENDO admitted for severe DKA - ICU hypoglycemic\Hyperglycemia protocol -check FSBS per protocol   GI GI PROPHYLAXIS as indicated  NUTRITIONAL STATUS DIET-->NPO Constipation protocol as indicated   ELECTROLYTES -follow labs as needed -replace as needed -pharmacy consultation and following     Best practice (right click and "Reselect all SmartList Selections" daily)  Diet:  NPO Pain/Anxiety/Delirium protocol (if indicated): No VAP protocol (if indicated): Not indicated DVT prophylaxis: Subcutaneous Heparin GI prophylaxis: PPI Glucose control:  SSI Yes Central venous access:  Yes, and it is still needed Arterial line:  N/A Foley:  Yes, and it is still  needed Mobility:  bed rest  PT consulted: N/A Code Status:  FULL CODE Disposition: ICU  Labs   CBC: Recent Labs  Lab 10/13/20 0500 10/14/20 0353 10/15/20 0351 10/16/20 0423 11-14-2020 0452  WBC 8.1 9.1 12.4* 14.4* 11.3*  HGB 7.9* 8.1* 9.6* 8.5* 7.6*  HCT 24.5* 25.0* 29.1* 26.2* 23.4*  MCV 106.1* 105.9* 104.7* 107.8* 107.3*  PLT 92* 85* 103* 119* 108*    Basic Metabolic Panel: Recent Labs  Lab 10/11/20 0517 10/12/20 0430 10/13/20 0500 10/13/20 1804 10/14/20 0353 10/15/20 0351 10/16/20 0423 Nov 14, 2020 0452  NA 149* 148* 149* 147* 146* 137 138 137  K 3.0* 3.1* 2.9* 2.9* 3.8 4.9 4.7 3.2*  CL 113* 115* 119* 120* 123* 116* 119* 117*  CO2 21* 15* 21* 21* 21* 16* 14* 14*  GLUCOSE 120* 160* 283* 194* 127* 321* 231* 116*  BUN 11 16 14 14 13 18  22* 19  CREATININE 0.77 1.12 0.84 0.72 0.70 0.81 0.88 0.68  CALCIUM 7.7* 8.0* 8.5* 8.3* 8.7* 9.0 8.7* 8.4*  MG 1.9 1.8 2.1  --   --  2.0  --   --   PHOS 4.2 4.6 4.0  --   --  3.3  --   --    GFR: Estimated Creatinine Clearance: 99.7 mL/min (by C-G formula based on SCr of 0.68 mg/dL). Recent Labs  Lab 10/14/20 0353 10/15/20 0351 10/16/20 0423 14-Nov-2020 0452  WBC 9.1 12.4* 14.4* 11.3*    Liver  Function Tests: Recent Labs  Lab 10/11/20 0517 10/12/20 0430 10/13/20 0500 10/15/20 0351 10/16/20 0423  AST 67* 60* 30 33 31  ALT 57* 56* 46* 41 36  ALKPHOS 184* 201* 227* 280* 257*  BILITOT 1.3* 2.9* 1.6* 1.0 1.0  PROT 4.9* 4.9* 5.2* 6.0* 6.0*  ALBUMIN 1.7* 1.6* 1.7* 1.9* 1.9*   No results for input(s): LIPASE, AMYLASE in the last 168 hours. No results for input(s): AMMONIA in the last 168 hours.  ABG    Component Value Date/Time   PHART 7.42 10/13/2020 1414   PCO2ART 28 (L) 10/13/2020 1414   PO2ART 315 (H) 10/13/2020 1414   HCO3 18.2 (L) 10/13/2020 1414   TCO2 21 (L) 05/27/2020 2051   ACIDBASEDEF 5.4 (H) 10/13/2020 1414   O2SAT 99.9 10/13/2020 1414     Coagulation Profile: Recent Labs  Lab 10/11/20 0517  10/12/20 0430 10/13/20 0500  INR 1.4* 1.2 1.1    Cardiac Enzymes: No results for input(s): CKTOTAL, CKMB, CKMBINDEX, TROPONINI in the last 168 hours.  HbA1C: HbA1c, POC (prediabetic range)  Date/Time Value Ref Range Status  02/29/2020 09:19 AM 9.1 (A) 5.7 - 6.4 % Final  03/28/2019 11:36 AM 11.5 (A) 5.7 - 6.4 % Final   HbA1c, POC (controlled diabetic range)  Date/Time Value Ref Range Status  02/29/2020 09:19 AM 9.1 (A) 0.0 - 7.0 % Final  03/28/2019 11:36 AM 11.5 (A) 0.0 - 7.0 % Final   HbA1c POC (<> result, manual entry)  Date/Time Value Ref Range Status  02/29/2020 09:19 AM 9.1 4.0 - 5.6 % Final  03/28/2019 11:36 AM 11.5 4.0 - 5.6 % Final   Hgb A1c MFr Bld  Date/Time Value Ref Range Status  10/06/2020 06:50 AM 11.1 (H) 4.8 - 5.6 % Final    Comment:    (NOTE) Pre diabetes:          5.7%-6.4%  Diabetes:              >6.4%  Glycemic control for   <7.0% adults with diabetes   10/06/2020 05:51 AM 11.1 (H) 4.8 - 5.6 % Final    Comment:    (NOTE) Pre diabetes:          5.7%-6.4%  Diabetes:              >6.4%  Glycemic control for   <7.0% adults with diabetes     CBG: Recent Labs  Lab 10/16/20 1731 10/16/20 2116 10/16/20 2338 10-23-2020 0351 10-23-2020 0728  GLUCAP 162* 105* 122* 118* 132*    Allergies No Known Allergies     DVT/GI PRX  assessed I Assessed the need for Labs I Assessed the need for Foley I Assessed the need for Central Venous Line Family Discussion when available I Assessed the need for Mobilization I made an Assessment of medications to be adjusted accordingly Safety Risk assessment completed  CASE DISCUSSED IN MULTIDISCIPLINARY ROUNDS WITH ICU TEAM     Critical Care Time devoted to patient care services described in this note is 65  minutes.  Critical care was necessary to treat or prevent imminent or life-threatening deterioration. Overall, patient is critically ill, prognosis is guarded.  Patient with Multiorgan failure and at  high risk for cardiac arrest and death.    Lucie Leather, M.D.  [facility]  Medical Director [name] Medical Director [facility] Cardio-Pulmonary Department

## 2020-10-28 NOTE — Progress Notes (Addendum)
GOALS OF CARE DISCUSSION  The Clinical status was relayed to family in detail. Children at bedside  Updated and notified of patients medical condition.  Patient remains unresponsive and will not open eyes to command.    [name] having a weak cough and struggling to remove secretions.   Patient with increased WOB and using accessory muscles to breathe Explained to family course of therapy and the modalities     Patient with Progressive multiorgan failure with a very high probablity of a very minimal chance of meaningful recovery despite all aggressive and optimal medical therapy. [name] in the Dying  Process associated with Suffering.  Family understands the situation.  They have consented and agreed to DNR/DNI and would like to proceed with Comfort care measures when another daughter arrives  Family are satisfied with Plan of action and management. All questions answered      Critical Care Time devoted to patient care services described in this note is 40 minutes.  Critical care was necessary to treat /prevent imminent and life-threatening deterioration. Overall, patient is critically ill, prognosis is guarded.  Patient with Multiorgan failure and at high risk for cardiac arrest and death.    Lucie Leather, M.D.  [facility]  Medical Director [facility] Medical Director [facility] Cardio-Pulmonary Department

## 2020-10-28 DEATH — deceased

## 2020-11-05 ENCOUNTER — Ambulatory Visit: Payer: Medicaid Other | Admitting: Internal Medicine

## 2021-01-19 IMAGING — CT CT HEAD W/O CM
3 series · 16 of 47 positions shown, 19 images · non-contrast
Comparison: None.

CLINICAL DATA: Head trauma, altered mental status

EXAM:
CT HEAD WITHOUT CONTRAST
TECHNIQUE: Contiguous axial images were obtained from the base of the skull
through the vertex without intravenous contrast.

[Series 2: head wo · axial · 0.46mm/px · z∈[-57,+83]mm · 10 of 34 slices shown, 13 images]
[im 3/34  brain]
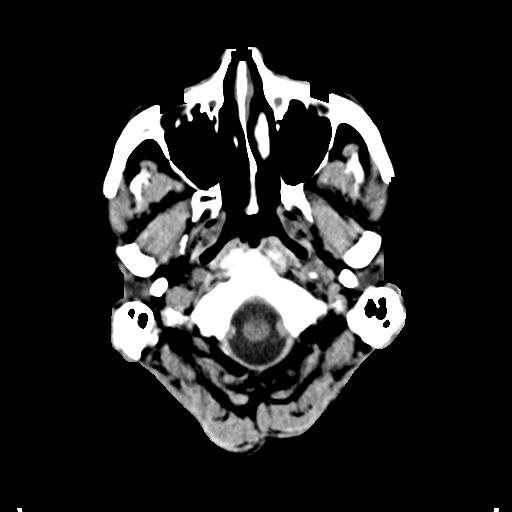
[im 3/34  bone]
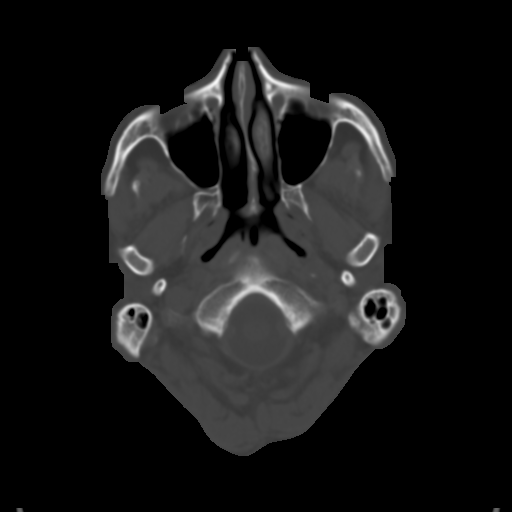
[im 6/34  brain]
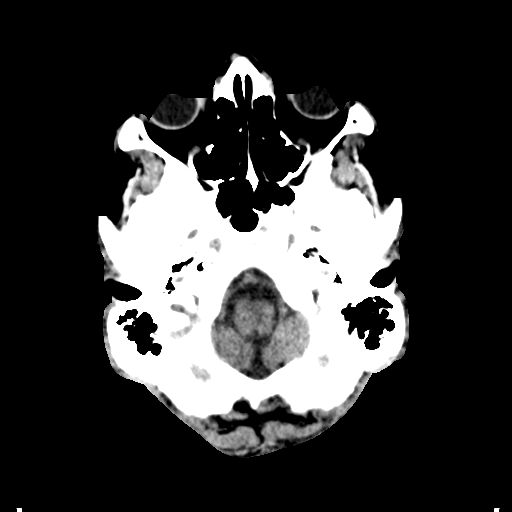
[im 10/34  brain]
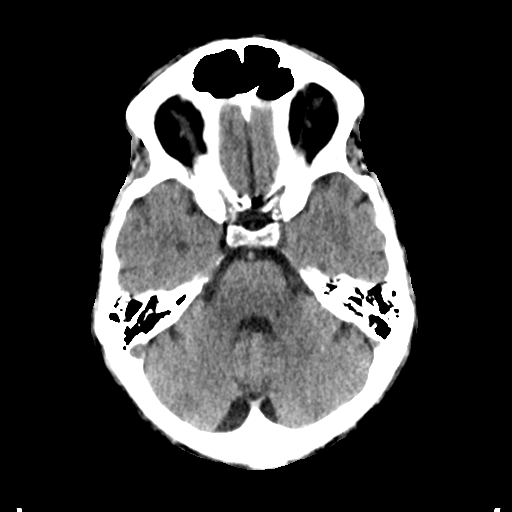
[im 12/34  brain]
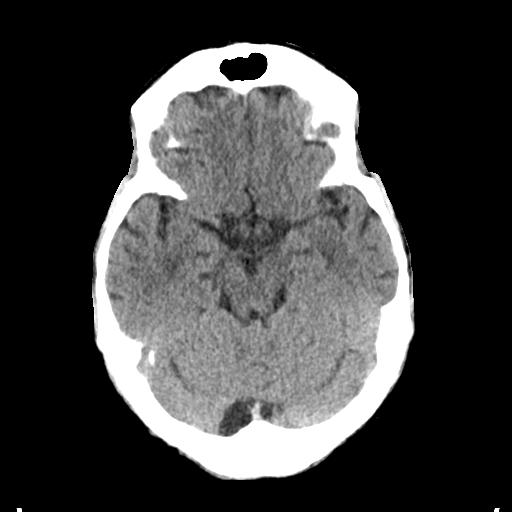
[im 15/34  brain]
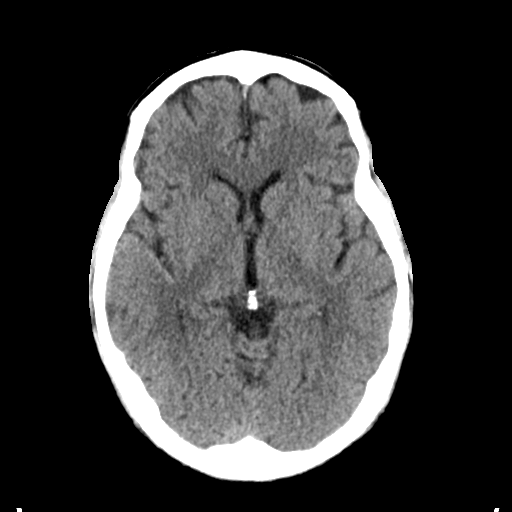
[im 15/34  bone]
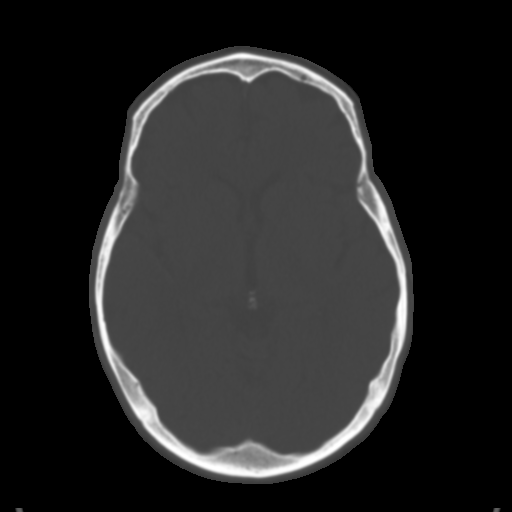
[im 19/34  brain]
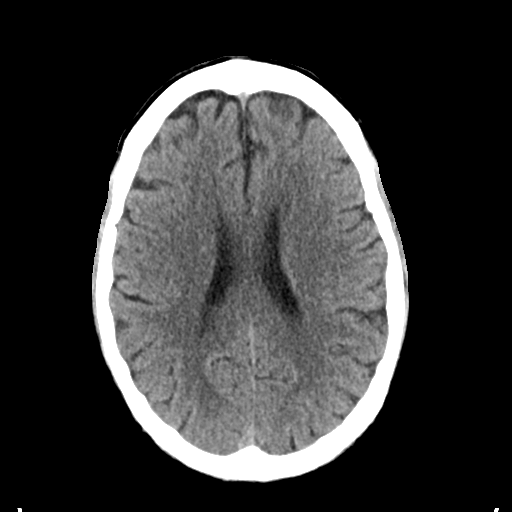
[im 22/34  brain]
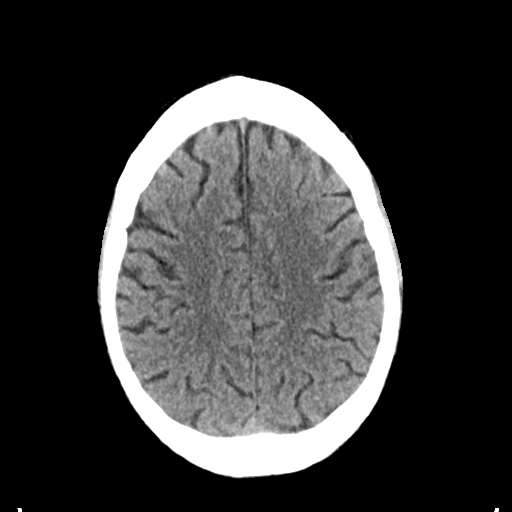
[im 26/34  brain]
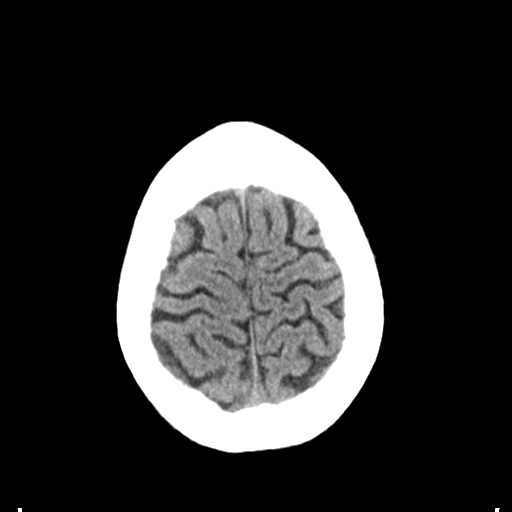
[im 28/34  brain]
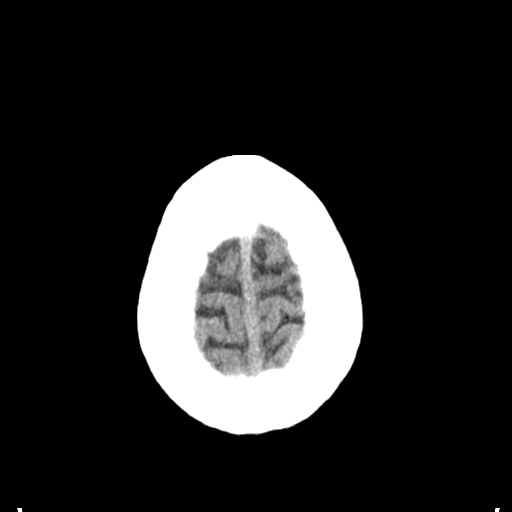
[im 28/34  bone]
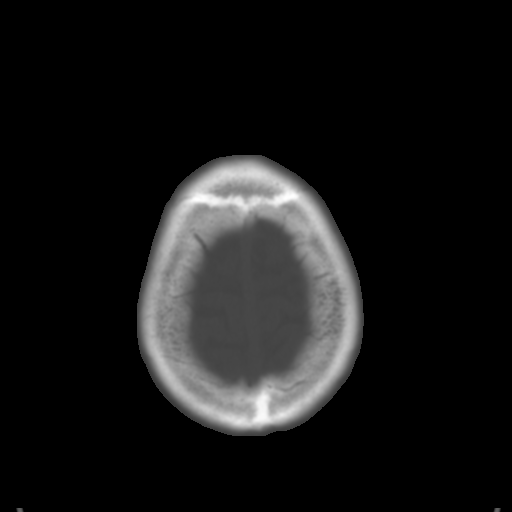
[im 31/34  brain]
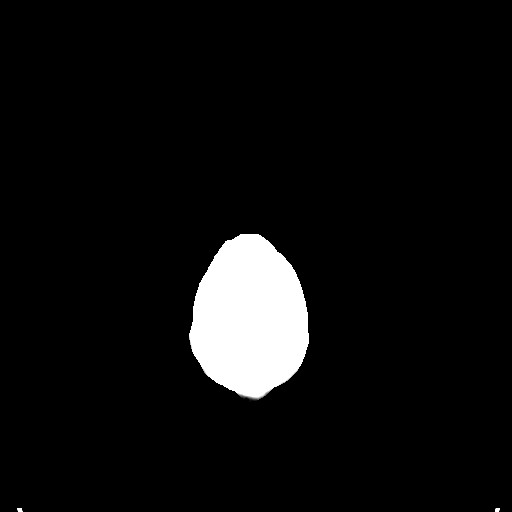

[Series 4: coronal soft tissue · coronal · 0.35mm/px · 3 of 75 slices shown]
[im 25/75  brain]
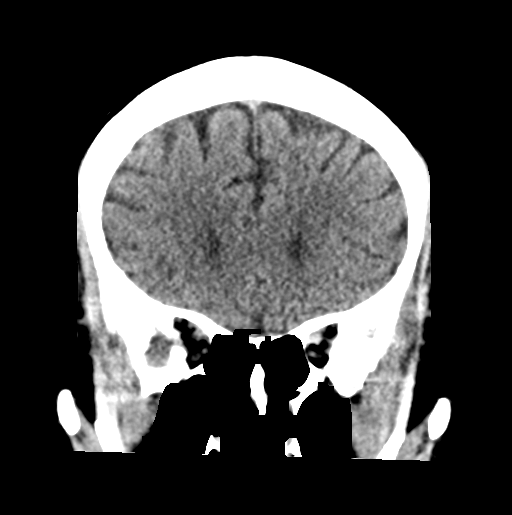
[im 33/75  brain]
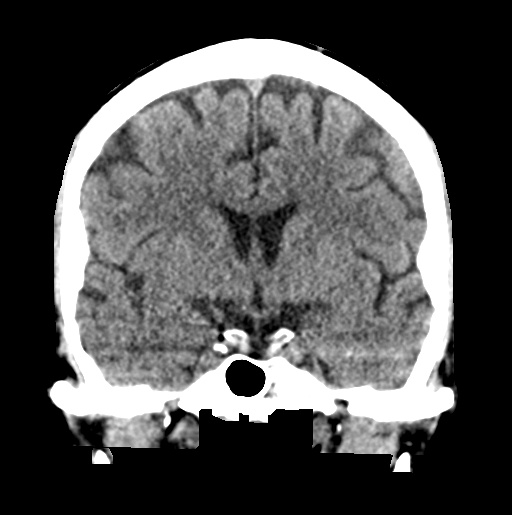
[im 42/75  brain]
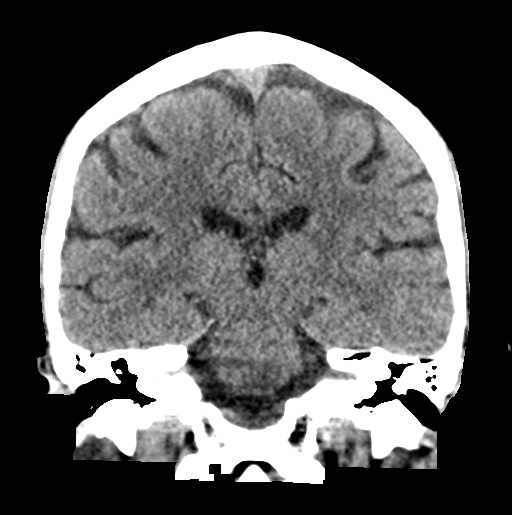

[Series 5: sagittal soft tissue · sagittal · 0.35mm/px · 3 of 57 slices shown]
[im 19/57  brain]
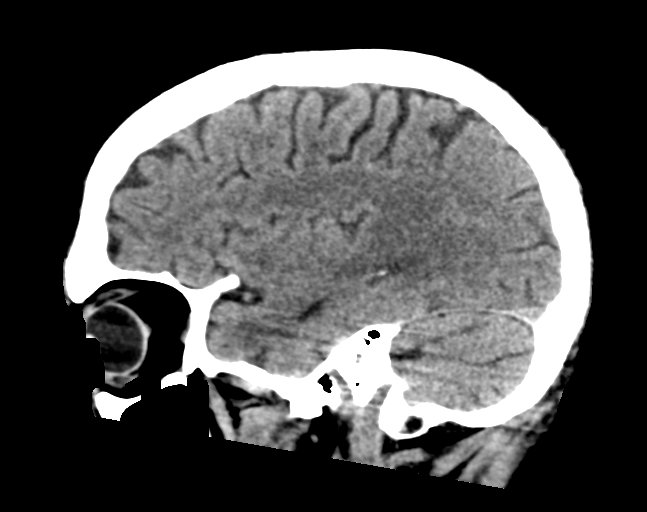
[im 29/57  brain]
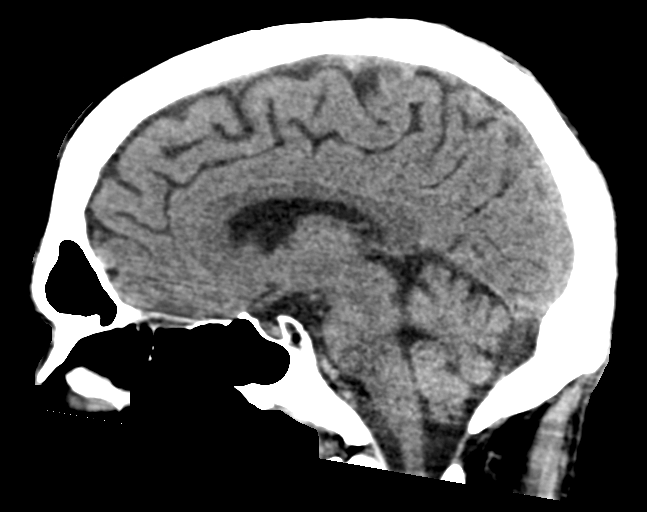
[im 38/57  brain]
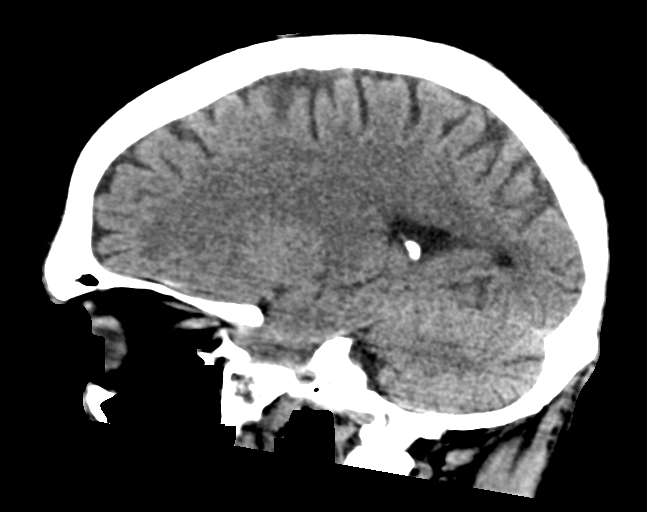

[16 of 47 positions shown; findings below may reference images not displayed]

FINDINGS: Brain: Normal anatomic configuration. No abnormal intra or
extra-axial mass lesion or fluid collection. No abnormal mass effect
or midline shift. No evidence of acute intracranial hemorrhage or
infarct. Ventricular size is normal. Cerebellum unremarkable.

Vascular: Unremarkable

Skull: Intact

Sinuses/Orbits: Paranasal sinuses are clear. Orbits are
unremarkable.

Other: Mastoid air cells and middle ear cavities are clear.
IMPRESSION: No acute intracranial abnormality.  No calvarial fracture.
